# Patient Record
Sex: Female | Born: 1940 | Race: Black or African American | Hispanic: No | Marital: Single | Smoking: Former smoker
Health system: Southern US, Community
[De-identification: ages and names within clinical notes are randomized; demographics above are authoritative.]

## PROBLEM LIST (undated history)

## (undated) DIAGNOSIS — E119 Type 2 diabetes mellitus without complications: Secondary | ICD-10-CM

## (undated) DIAGNOSIS — I255 Ischemic cardiomyopathy: Secondary | ICD-10-CM

## (undated) DIAGNOSIS — Z8673 Personal history of transient ischemic attack (TIA), and cerebral infarction without residual deficits: Secondary | ICD-10-CM

## (undated) DIAGNOSIS — F0390 Unspecified dementia without behavioral disturbance: Secondary | ICD-10-CM

## (undated) DIAGNOSIS — I639 Cerebral infarction, unspecified: Secondary | ICD-10-CM

## (undated) DIAGNOSIS — E782 Mixed hyperlipidemia: Secondary | ICD-10-CM

## (undated) DIAGNOSIS — G40909 Epilepsy, unspecified, not intractable, without status epilepticus: Secondary | ICD-10-CM

## (undated) DIAGNOSIS — I252 Old myocardial infarction: Secondary | ICD-10-CM

## (undated) DIAGNOSIS — I10 Essential (primary) hypertension: Secondary | ICD-10-CM

## (undated) DIAGNOSIS — D631 Anemia in chronic kidney disease: Secondary | ICD-10-CM

## (undated) DIAGNOSIS — I2510 Atherosclerotic heart disease of native coronary artery without angina pectoris: Secondary | ICD-10-CM

## (undated) DIAGNOSIS — Z8719 Personal history of other diseases of the digestive system: Secondary | ICD-10-CM

## (undated) DIAGNOSIS — Z7401 Bed confinement status: Secondary | ICD-10-CM

## (undated) DIAGNOSIS — I509 Heart failure, unspecified: Secondary | ICD-10-CM

## (undated) DIAGNOSIS — N184 Chronic kidney disease, stage 4 (severe): Principal | ICD-10-CM

## (undated) DIAGNOSIS — R569 Unspecified convulsions: Secondary | ICD-10-CM

## (undated) DIAGNOSIS — J9611 Chronic respiratory failure with hypoxia: Secondary | ICD-10-CM

## (undated) DIAGNOSIS — R0902 Hypoxemia: Secondary | ICD-10-CM

## (undated) HISTORY — DX: Anemia in chronic kidney disease: D63.1

## (undated) HISTORY — DX: Hypoxemia: R09.02

## (undated) HISTORY — DX: Chronic kidney disease, stage 4 (severe): N18.4

---

## 2000-11-24 HISTORY — PX: ESOPHAGOGASTRODUODENOSCOPY: SHX1529

## 2000-11-24 HISTORY — PX: COLONOSCOPY: SHX174

## 2001-01-09 ENCOUNTER — Inpatient Hospital Stay (HOSPITAL_COMMUNITY): Admission: EM | Admit: 2001-01-09 | Discharge: 2001-01-20 | Payer: Self-pay | Admitting: Internal Medicine

## 2001-01-09 ENCOUNTER — Encounter: Payer: Self-pay | Admitting: Internal Medicine

## 2001-01-11 ENCOUNTER — Encounter: Payer: Self-pay | Admitting: Internal Medicine

## 2001-01-13 ENCOUNTER — Encounter: Payer: Self-pay | Admitting: Internal Medicine

## 2001-01-15 ENCOUNTER — Encounter: Payer: Self-pay | Admitting: Internal Medicine

## 2001-03-31 ENCOUNTER — Ambulatory Visit (HOSPITAL_COMMUNITY): Admission: RE | Admit: 2001-03-31 | Discharge: 2001-03-31 | Payer: Self-pay | Admitting: Gastroenterology

## 2001-05-11 ENCOUNTER — Ambulatory Visit (HOSPITAL_COMMUNITY): Admission: RE | Admit: 2001-05-11 | Discharge: 2001-05-11 | Payer: Self-pay | Admitting: Family Medicine

## 2001-05-11 ENCOUNTER — Encounter: Payer: Self-pay | Admitting: Family Medicine

## 2001-05-24 HISTORY — PX: CORONARY ARTERY BYPASS GRAFT: SHX141

## 2001-05-28 ENCOUNTER — Encounter: Payer: Self-pay | Admitting: Thoracic Surgery (Cardiothoracic Vascular Surgery)

## 2001-06-01 ENCOUNTER — Encounter: Payer: Self-pay | Admitting: Thoracic Surgery (Cardiothoracic Vascular Surgery)

## 2001-06-01 ENCOUNTER — Inpatient Hospital Stay (HOSPITAL_COMMUNITY)
Admission: RE | Admit: 2001-06-01 | Discharge: 2001-06-06 | Payer: Self-pay | Admitting: Thoracic Surgery (Cardiothoracic Vascular Surgery)

## 2001-06-02 ENCOUNTER — Encounter: Payer: Self-pay | Admitting: Thoracic Surgery (Cardiothoracic Vascular Surgery)

## 2001-06-03 ENCOUNTER — Encounter: Payer: Self-pay | Admitting: Thoracic Surgery (Cardiothoracic Vascular Surgery)

## 2001-06-06 ENCOUNTER — Encounter: Payer: Self-pay | Admitting: Thoracic Surgery (Cardiothoracic Vascular Surgery)

## 2001-06-18 ENCOUNTER — Ambulatory Visit (HOSPITAL_COMMUNITY): Admission: RE | Admit: 2001-06-18 | Discharge: 2001-06-18 | Payer: Self-pay | Admitting: Cardiology

## 2002-06-22 ENCOUNTER — Encounter: Payer: Self-pay | Admitting: Family Medicine

## 2002-06-22 ENCOUNTER — Ambulatory Visit (HOSPITAL_COMMUNITY): Admission: RE | Admit: 2002-06-22 | Discharge: 2002-06-22 | Payer: Self-pay | Admitting: Family Medicine

## 2004-02-05 ENCOUNTER — Ambulatory Visit (HOSPITAL_COMMUNITY): Admission: RE | Admit: 2004-02-05 | Discharge: 2004-02-05 | Payer: Self-pay | Admitting: Family Medicine

## 2004-08-05 ENCOUNTER — Ambulatory Visit (HOSPITAL_COMMUNITY): Admission: RE | Admit: 2004-08-05 | Discharge: 2004-08-05 | Payer: Self-pay | Admitting: Family Medicine

## 2004-10-02 ENCOUNTER — Ambulatory Visit: Payer: Self-pay | Admitting: Cardiology

## 2004-10-09 ENCOUNTER — Ambulatory Visit: Payer: Self-pay | Admitting: Cardiology

## 2004-10-09 ENCOUNTER — Ambulatory Visit (HOSPITAL_COMMUNITY): Admission: RE | Admit: 2004-10-09 | Discharge: 2004-10-09 | Payer: Self-pay | Admitting: Cardiology

## 2005-06-24 ENCOUNTER — Ambulatory Visit (HOSPITAL_COMMUNITY): Admission: RE | Admit: 2005-06-24 | Discharge: 2005-06-24 | Payer: Self-pay | Admitting: Family Medicine

## 2006-02-19 ENCOUNTER — Ambulatory Visit (HOSPITAL_COMMUNITY): Admission: RE | Admit: 2006-02-19 | Discharge: 2006-02-19 | Payer: Self-pay | Admitting: General Surgery

## 2006-02-19 ENCOUNTER — Encounter (INDEPENDENT_AMBULATORY_CARE_PROVIDER_SITE_OTHER): Payer: Self-pay | Admitting: *Deleted

## 2006-10-11 ENCOUNTER — Emergency Department (HOSPITAL_COMMUNITY): Admission: EM | Admit: 2006-10-11 | Discharge: 2006-10-11 | Payer: Self-pay | Admitting: Emergency Medicine

## 2007-04-20 ENCOUNTER — Ambulatory Visit (HOSPITAL_COMMUNITY): Admission: RE | Admit: 2007-04-20 | Discharge: 2007-04-20 | Payer: Self-pay | Admitting: Family Medicine

## 2007-05-16 ENCOUNTER — Ambulatory Visit: Payer: Self-pay | Admitting: Cardiology

## 2007-05-16 ENCOUNTER — Observation Stay (HOSPITAL_COMMUNITY): Admission: EM | Admit: 2007-05-16 | Discharge: 2007-05-18 | Payer: Self-pay | Admitting: Emergency Medicine

## 2007-12-15 ENCOUNTER — Ambulatory Visit (HOSPITAL_COMMUNITY): Admission: RE | Admit: 2007-12-15 | Discharge: 2007-12-15 | Payer: Self-pay | Admitting: Family Medicine

## 2008-05-04 ENCOUNTER — Ambulatory Visit (HOSPITAL_COMMUNITY): Admission: RE | Admit: 2008-05-04 | Discharge: 2008-05-04 | Payer: Self-pay | Admitting: Family Medicine

## 2008-10-26 ENCOUNTER — Ambulatory Visit (HOSPITAL_COMMUNITY): Admission: RE | Admit: 2008-10-26 | Discharge: 2008-10-26 | Payer: Self-pay | Admitting: Family Medicine

## 2010-01-11 ENCOUNTER — Other Ambulatory Visit: Admission: RE | Admit: 2010-01-11 | Discharge: 2010-01-11 | Payer: Self-pay | Admitting: Family Medicine

## 2010-04-03 ENCOUNTER — Ambulatory Visit (HOSPITAL_COMMUNITY): Admission: RE | Admit: 2010-04-03 | Discharge: 2010-04-03 | Payer: Self-pay | Admitting: Family Medicine

## 2010-12-15 ENCOUNTER — Encounter: Payer: Self-pay | Admitting: Family Medicine

## 2011-04-08 NOTE — Consult Note (Signed)
[name], [name]               ACCOUNT NO.:  [id number]   MEDICAL RECORD NO.:  [id number]          PATIENT TYPE:  OBV   LOCATION:  A207                          FACILITY:  [facility]   PHYSICIAN:  Kofi A. Gerilyn Pilgrim, M.D. DATE OF BIRTH:  01/12/1941   DATE OF CONSULTATION:  05/18/2007  DATE OF DISCHARGE:  05/18/2007                                 CONSULTATION   HISTORY:  A 70 year old black female who had the acute onset of slurred  speech, gait ataxia, and generalized weakness.  The patient was taken to  the emergency room where she was noted to have some left-sided weakness.  It appears that she has recovered.  She reports being on aspirin at home  and has been compliant with this.   PAST MEDICAL HISTORY:  1. Apparently significant for a mini stroke 3 years ago.  2. History of coronary artery disease.  3. Type 2 diabetes.  4. Hypertension.  5. Dyslipidemia.  6. Remote history of GI bleed.   ADMISSION MEDICATIONS:  Aspiring, omeprazole, glipizide, metoprolol,  Lipitor.   REVIEW OF SYSTEMS:  Unrevealing other than as stated in History of  Present Illness.   PAST SURGICAL HISTORY:  Status post coronary artery bypass grafting.   PHYSICAL EXAMINATION:  GENERAL:  A pleasant lady in no acute distress.  VITAL SIGNS:  Temperature 98.4, pulse 71, respirations 18, blood  pressure 162/59.  HEENT:  Head is normocephalic and atraumatic.  NECK:  Supple.  ABDOMEN:  Soft.  EXTREMITIES:  No edema.  NEUROLOGIC:  Mentation:  The patient is awake and alert.  She converses  well.  I see no evidence of dysarthria or aphasia.  She is lucid,  coherent, and follows commands bilaterally.  Cranial nerve evaluation  shows pupils equal, round, and reactive to light and accommodation.  Extraocular movements are intact.  Visual field are full.  Facial muscle  strength is symmetric. Tongue is midline. Uvula is midline.  Motor  examination shows normal tone, bulk, and strength.  I see no pronator  drift.   Coordination is essentially unrevealing.  There is a little bit  of dysmetria involved in the right upper extremity.  There is no past  pointing, no tremors or rigidity noted.  No bradykinesia.  Reflexes are  symmetric. Plantars downgoing. Sensation normal to temperature and light  touch.   MRI of the brain was reviewed.  Impressions was that there is an acute  infarct involving the posterior limb of the internal capsule.  On the  left, there is extensive chronic lipose vascular ischemic changes.  There are also remote infarcts, especially involving basal ganglia,  particularly the head of the caudate nuclei bilaterally.   Head CT scan is unrevealing.   Carotid Dopplers show no acute stenosis.   Echocardiogram is pending.   ASSESSMENT:  Lacunar-type infarct, likely due to hypertension, diabetes.   RECOMMENDATIONS:  I think her antiplatelet agent should be upgraded to  Aggrenox.  She should continue with her other regimen including blood  pressure, diabetes, and cholesterol control.   Thanks for this consultation.      Kofi  A. Gerilyn Pilgrim, M.D.  Electronically Signed     KAD/MEDQ  D:  05/19/2007  T:  05/19/2007  Job:  [id number]

## 2011-04-08 NOTE — H&P (Signed)
[name], [name]               ACCOUNT NO.:  [id number]   MEDICAL RECORD NO.:  [id number]          PATIENT TYPE:  INP   LOCATION:  A219                          FACILITY:  [facility]   PHYSICIAN:  Marcello Moores, MD   DATE OF BIRTH:  [birth year]   DATE OF ADMISSION:  05/16/2007  DATE OF DISCHARGE:  LH                              HISTORY & PHYSICAL   CHIEF COMPLAINT:  Weakness and change in speech pattern since this  morning.   HISTORY OF PRESENT ILLNESS:  [name] is a 70 year old female patient  with history of diabetes mellitus, hypertension and coronary artery  disease status post CABG who presents to emergency room by her daughter  for change in her speech pattern and generalized weakness. As per the  patient's daughter, who lives with her, she is stated that this morning  around 8:00 a.m. while they were getting ready to go to church, she  noticed that her mother's speech changed and was sort of flat. She  notices also she is weak, and she was slow to walk,and she was not  speaking as usual; she was very silent for a few works, and she decided  to bring her to the emergency room. Otherwise, she did not notice other  changes.  She did not notice any change in her breathing.  She did not  complain to her of any chest pain.  She did not complain to her of any  headache.  In the emergency room when I tried to interview her, also she  was very slow and responded to few of my questions.  She stated that she  feels weak, and she stated that she is not sure if she can walk or not,  and when I asked her to be out of the bed, she tried. She was able to  stand alone, but walking was difficult for her.  She was unable to keep  her balance, but it is very difficult to assess whether it is weakness  or imbalance. Otherwise, the history was very limited except what was  extracted from her daughter, but his daughter believes that the mother  was relatively okay until yesterday evening when  she went to bed.   REVIEW OF SYSTEMS:  As detailed in the HPI, otherwise it is limited  because the patient is not giving reasonable history.   ALLERGIES:  There are no known drug allergies.   SOCIAL HISTORY:  She lives with her daughter.  She smokes currently, but  she stopped drinking, and she denied any drug use.   PAST MEDICAL HISTORY:  1. She has history of mini stroke several years back as per the      daughter.  2. History of coronary artery disease status post CABG in 2002.  3. Diabetes mellitus, type 2.  4. Hypertension.  5. Hypercholesterolemia.  6. History of remote GI bleed.   HOME MEDICATIONS:  1. Omeprazole 20 mg p.o. once a day.  2. Glipizide 2.5 mg p.o. daily.  3. Metoprolol ER 100 mg once a day.  4. Lipitor 40 mg p.o. once a  day.   PHYSICAL EXAMINATION:  GENERAL:  The patient is lying in the emergency  room bed without any respiratory distress, and she was actually eating  her lunch without any problem.  VITAL SIGNS:  Blood pressure 187/77, pulse rate 72, respiratory rate 20,  and temperature 98, saturation 100%.  HEENT: She has pink conjunctivae.  Nonicteric sclerae.  Pupils are equal  and reactive to light.  There is not any fascial deviation.  NECK:  Supple.  CHEST:  She has good air entry bilaterally.  CARDIOVASCULAR:  S1-S2 regular, well-heard.  No murmur.  ABDOMEN:  Soft.  No area of tenderness.  Normoactive bowel sounds.  EXTREMITIES:  She does not have pedal edema.  CNS: She is alert, but she is slow, and she might be mildly demented.  On neurological examination, the power on the right side is 5/5 on upper  and lower extremities.  On the left side on the lower extremity, she can  lift her leg against gravity, but it was difficult for her to elevate  against any minor load; so it looks like there is weakness 4/5.  The  left hand also looks weaker when compared to the right. At this time it  is very difficult to say for sure there is left-side  paresis.  Otherwise, the reflexes look also on the left, knee jerk was  exaggerated. Otherwise other reflexes are normal, and tone is normal on  both sides.  She can whistle, and she can close her eyes bilaterally.  She can blow, and she can protrude her tongue centrally.  Other cranial  nerve examination is intact including sensation.  On the speech, she is  very slow to respond, but I did not see any abnormal pattern at this  time. I am not sure if this slowness is part of her chronic declining  dementia or a new onset.   LABORATORY DATA:  White blood cell was 4.3, and hemoglobin is 12.6,  hematocrit 38.5, and platelet count 195. On the chemistries, sodium is  140, potassium 4.8, chloride 115, bicarb 322, glucose 115, BUN 12, and  creatinine is 1.  First set of cardiac enzymes: CK-MB is 1.7, and  troponin is less than 0.05.   CAT scan of the brain was done also in the emergency room, and it shows  multiple lacunar infarcts in the basal ganglia and stenosis.  Otherwise  it is negative for acute hemorrhage, and an MRI was recommended.      Marcello Moores, MD  Electronically Signed     MT/MEDQ  D:  05/16/2007  T:  05/16/2007  Job:  [id number]

## 2011-04-08 NOTE — Discharge Summary (Signed)
NAMECHARLI, [name]               ACCOUNT NO.:  [id number]   MEDICAL RECORD NO.:  [id number]          PATIENT TYPE:  OBV   LOCATION:  A207                          FACILITY:  [facility]   PHYSICIAN:  Osvaldo Shipper, MD     DATE OF BIRTH:  1941/06/18   DATE OF ADMISSION:  05/16/2007  DATE OF DISCHARGE:  06/24/2008LH                               DISCHARGE SUMMARY   Please review H&P dictated by Dr. Benson Setting for details regarding the  patient's presenting illness.   PRIMARY MEDICAL DOCTOR:  Annia Friendly. [name], M.D.   DISCHARGE DIAGNOSES:  1. Acute left-sided cerebrovascular accident.  2. History of coronary artery disease, status post coronary artery      bypass graft.  3. History of hypertension.  4. History of diabetes.   BRIEF HOSPITAL COURSE:  Briefly, this is a 70 year old [place]  female who presented with weakness and speech difficulties two days ago.  She was seen in the emergency department.  She underwent a CT of the  head which showed no acute findings.  Advanced chronic small vessel  disease was noted, especially involving the basal ganglion.  Carotid  Dopplers did not show any significant stenosis.  She underwent MRI which  did show an acute lacunar infarct in the posterior limb of the internal  capsule on the left side.  Atrophy and small vessel disease were also  noted.  The patient also underwent an echocardiogram which showed normal  left ventricular size, with mild to moderate hypertrophy.  Mild  dyskinesis of the basilar posterior wall, including a small aneurysmal  segment, was also noted.  Systolic function was mildly impaired.   She also underwent blood work which was quite unremarkable actually.  Her cardiac enzymes were normal.  Blood work today does show a  bicarbonate of 15.  It was 22 two days ago.  I am unclear as to why the  bicarbonate is a little bit low today.  She is in no distress  whatsoever.   The patient is doing well.  She was cleared by  physical therapy.  She  has regained her speech.  She is able to ambulate independently with no  difficulties.  Her blood pressure is a little bit on the higher side.  Could be better controlled.  She probably would benefit from an ACE  inhibitor as well.  Otherwise, the patient is considered stable for  discharge.  She was seen by Dr. Gerilyn Pilgrim, who recommended Aggrenox  instead of Plavix.  She is already on a statin.   DISCHARGE MEDICATIONS:  1. Aggrenox 1 capsule daily for 2 more days, and then b.i.d.  2. Metoprolol extended release, change to 150 mg once daily.  3. Lisinopril 5 mg once a day.  4. Glyburide 2.5 mg once a day.  5. Lipitor 40 mg once a day.  6. Omeprazole 20 mg once a day.  7. Colace 100 mg once a day.  8. Iron tablets once a day.   FOLLOWUP:  1. With her PMD in 1-2 weeks.  2. With [name] for followup on abnormal echocardiogram in  1-2      weeks.   DIET:  She may have a modified carbohydrate diet.   PHYSICAL ACTIVITY:  No restrictions.   OUTPATIENT STUDIES RECOMMENDED:  Fasting lipid profile, which  unfortunately was not done during this hospital stay.   CONSULTATIONS:  Kofi A. Gerilyn Pilgrim, M.D., neurologist.   IMAGING STUDIES:  Discussed above.   TOTAL TIME AT DISCHARGE:  40 minutes.      Osvaldo Shipper, MD  Electronically Signed     GK/MEDQ  D:  05/18/2007  T:  05/18/2007  Job:  [id number]   cc:   Darleen Crocker A. Gerilyn Pilgrim, M.D.  Fax: [phone]   Annia Friendly. [name], MD  Fax: [phone]   [name], MD, [facility]  [address]  [place], [place] [address]

## 2011-04-08 NOTE — H&P (Signed)
NAMEJULIEANNA, [name]               ACCOUNT NO.:  [id number]   MEDICAL RECORD NO.:  [id number]          PATIENT TYPE:  INP   LOCATION:  A219                          FACILITY:  [facility]   PHYSICIAN:  Marcello Moores, MD   DATE OF BIRTH:  January 03, 1941   DATE OF ADMISSION:  05/16/2007  DATE OF DISCHARGE:  LH                              HISTORY & PHYSICAL   CONTINUATION:   ASSESSMENT:  1. Cerebrovascular accident versus transient ischemic attack.  The      patient had episode of slurred speech and slow movement with      possible altered mental status early in the morning, and she had      history of this transient ischemic attack or cerebrovascular      accident also before.  On examination, there is questionable      weakness on the right side but still visible slowness of her speech      and tone. We will admit her as possible CVA and will put her on      aspirin 325 mg with Plavix.  We will do tomorrow morning also MRI      and will put her on monitoring.  Tomorrow we will do carotid      Doppler as well, and will consult neurology also for further      evaluation. The patient is also on a statin,  and we will      continue all that and will monitor her blood pressure and control      with medication as well.  2. History of coronary artery disease status post coronary artery      bypass grafting.  The patient will have evaluation tomorrow and      will send also three times cardiac markers with EKG. If there is      any need of consultation, we will involve cardiologist.  Currently,      she has no issues related that.  3. Hypertension is not well controlled and will continue her home      medication metoprolol.  We will give it now, and if blood pressure      remains on the high side, we will optimize her hypertension      medications as well.  4. History of stroke. As per the patient's daughter, she has history      of stroke before as well, and it might be also part of what she  has      today.  5. Hypercholesterolemia. She is on Lipitor.  Will continue with that,      and we will send fasting lipid profile in the morning as well.  6. Diabetes mellitus. Currently it is well controlled with p.o.      hypoglycemic medications.  7. We will put her on deep vein thrombosis prophylaxis as well as      gastrointestinal prophylaxis, and she will be on telemetry for      today.   Our management will depend on the clinical assessment after  reevaluation.      Marcello Moores, MD  Electronically Signed     MT/MEDQ  D:  05/16/2007  T:  05/16/2007  Job:  [id number]

## 2011-04-08 NOTE — Procedures (Signed)
[name], [name]               ACCOUNT NO.:  [id number]   MEDICAL RECORD NO.:  [id number]          PATIENT TYPE:  OBV   LOCATION:  A207                          FACILITY:  [facility]   PHYSICIAN:  [name], MD, FACCDATE OF BIRTH:  02-09-1941   DATE OF PROCEDURE:  05/17/2007  DATE OF DISCHARGE:                                ECHOCARDIOGRAM   CLINICAL DATA:  A 70 year old woman with CVA, hypertension and diabetes.   M-MODE:  Aorta 3.0, left atrium 3.4, septum 1.6, posterior wall 0.9, LV  diastole 4.5, LV systole 3.7.   1. Technically adequate echocardiographic study.  2. Normal left atrium, right atrium, and right ventricle.  3. Normal aortic valve and proximal ascending aorta.  4. Normal mitral valve; mild annular calcification; very mild      regurgitation.  5. Normal tricuspid valve with physiologic regurgitation.  6. Normal pulmonic valve and proximal pulmonary artery.  7. Normal left ventricular size; mild to moderate hypertrophy;      akinesis to mild dyskinesis of the basilar posterior wall,      including a small aneurysmal segment.  Overall LV systolic function      is mildly impaired.  8. Normal IVC.      [name], MD, [facility]  Electronically Signed     RMR/MEDQ  D:  05/17/2007  T:  05/18/2007  Job:  [id number]

## 2011-04-11 NOTE — Cardiovascular Report (Signed)
[place]  Patient:    [name], [name]                      MRN: [id number] Proc. Date: 01/13/01 Adm. Date:  [id number] Attending:  [name] CC:         [name], M.D., [facility]  [name], M.D.  CV Laboratory   Cardiac Catheterization  INDICATIONS:  Mr. Guevarra is a 70 year old who has had a previous catheterization done in 1993 demonstrating patency of the LAD and occlusion of the circumflex, as well as right coronary.  She now presents with recurrent anginal type symptoms.  The current study was done to access coronary anatomy.  PROCEDURES: 1. Left and right heart catheterization. 2. Selective coronary arteriography. 3. Subclavian angiography.  DESCRIPTION OF PROCEDURE:  The procedure was performed from the right femoral artery and right femoral vein.  An 8 [place] sheath was used for the right femoral vein, a 7.5 French thermodilution Swan-Ganz catheter was used to cannulate and measure pressures in the right heart.  Superior vena cava saturation, pulmonary artery saturations were also obtained.  Thermodilution cardiac outputs were performed.  Simultaneous pressures were obtained in the LV and pulmonary capillary wedge position and a pressure pullback was performed.  Ventriculography was avoided because of the contrast load. Subclavian and coronary angiography were then performed without complication. All catheters were removed.  The patient was taken to the holding area in satisfactory clinical condition.  Because of elevated pressures, we did elect to give her 20 mg of Lasix.  HEMODYNAMICS: 1. Pressures:    a. Right atrium 8.    b. Right ventricle 35/9.    c. Pulmonary artery 38/20.    d. Pulmonary capillary wedge, mean 16.    e. Aortic 127/71.    f. LV 125/21. 2. Saturations:    a. Superior vena cava 58%.    b. Pulmonary artery 58%.    c. Aortic root 98%. 3. Cardiac outputs:    a. Fick  4.7/2.7.    b. Thermodilution 3.0/1.87.  ANGIOGRAPHIC DATA:  The left main coronary artery demonstrates 50% tapered narrowing in its distal most aspect.  The left anterior descending artery has about an 80-90% focal stenosis before a diagonal branch.  The distal vessel is a fairly large caliber vessel and wraps the apex and provides collaterals to the distal right coronary circulation.  There is a large ramus intermedius with about 50-60% proximal narrowing. There is a suggestion of narrowing of up to 70% involving the ostium of the circumflex.  The AV circumflex also is without critical disease and suitable for grafting.  The right coronary artery demonstrates a right ventricular branch with tandem 90% stenoses.  There is total occlusion of the right coronary artery with some late filling of the vessel through left to right collaterals.  The subclavian is widely patent.  CONCLUSIONS: 1. Mild pulmonary hypertension. 2. Three-vessel coronary artery disease.  DISPOSITION:  I will review the findings with Dr. Graciela Husbands.  Once her pulmonary status improves, she may be a candidate for revascularization. DD:  01/13/01 TD:  01/14/01 Job: [id number]

## 2011-04-11 NOTE — Consult Note (Signed)
[place]. [facility]  Patient:    [name], [name]                      MRN: [id number] Proc. Date: 01/14/01 Adm. Date:  [id number] Attending:  [name] CC:         Arturo Morton. Riley Kill, M.D. [facility]  [name], M.D. [facility]  [name], M.D., [facility]  Mirna Mires, M.D.  [name], M.D.   Consultation Report  REFERRING PHYSICIAN:  Noralyn Pick. Eden Emms, M.D.  PRIMARY CARDIOLOGIST:  [name], M.D. and [name], M.D.  PRIMARY CARE PHYSICIAN:  Mirna Mires, M.D. and [name], M.D.  REASON FOR CONSULTATION:  Severe three-vessel coronary artery disease, status post acute non-Q-wave myocardial infarction in the setting of acute GI bleed.  HISTORY OF PRESENT ILLNESS:  The patient is a 70 year old African-American female from [place], [place], who is disabled due to previous strokes and poor eyesight. She has history of coronary artery disease dating back to 70. At which time, she apparently suffered myocardial infarction. She underwent cardiac catheterization at that time demonstrating 100% occlusion of the mid right coronary artery and the distal left circumflex coronary arteries, respectively. There was insignificant disease in the left anterior descending coronary artery. The patient was treated medically. The patient additionally has cardiac risk factors, including hypertension, type 2 diabetes mellitus, continued ongoing tobacco abuse, hyperlipidemia, severe peripheral vascular disease with history of previous strokes. The patient was in her usual state of health, although she describes a 40-pound weight loss over the last 6 months. She presented to [facility] on February 15 and again on February 16 with severe weakness, dizziness, shortness of breath, and a dry cough. She reported a 4 to 5-day history of melena. She was noted to have severe anemia with a baseline hemoglobin of  6.3 and hematocrit of 18.5% with obvious gross heme-positive stool. She was also noted to have relatively low blood pressure and elevated cardiac enzymes. She ruled in for an acute non-Q-wave myocardial infarction with peak total CPK of 416, with a CK-MB fraction of 31. Her baseline prothrombin time on admission was 26.3, with an INR of 4.4 due to the patients continued therapy with Coumadin. The patient was stabilized and subsequently transferred to [facility]. Since arrival at [facility], the patient has been stabilized. She initially apparently had episodes of supraventricular tachycardia and subsequently converted to normal sinus rhythm. Her coagulopathy was corrected, and she underwent upper GI endoscopy by Judie Petit T. Pleas Koch., M.D. on February 17. This demonstrated erosive gastritis and duodenitis which was felt to be the likely source of the patients GI bleeding. The patients anemia was corrected with transfusion of blood products, and she gradually improved. A transthoracic echocardiogram was performed on February 18. This revealed severe left ventricular dysfunction with baseline ejection fraction estimated between 25 and 35%. The left ventricle was noted to be a normal size, however; and there was only mild mitral regurgitation. There was no significant aortic valve pathology. No other abnormalities were noted. The patient subsequently underwent elective cardiac catheterization with left and right heart catheterization on February 20 by Arturo Morton. Riley Kill, M.D. This reveals severe three-vessel coronary artery disease. A left ventriculogram was not performed, but right heart pressures were only mildly elevated with a PA pressure of 38/20, with a pulmonary capillary wedge pressure of 16. There was no gradient measured across the aortic valve. Resting cardiac  output was 3.0 L corresponding to a cardiac index of 1.9 via thermodilution. Via the Fick method, the  cardiac output was 4.7. Mixed venous oxygen saturation was 58%. The patient was referred for possible elective surgical revascularization.  REVIEW OF SYSTEMS:  Mrs. Pontarelli reports longstanding history of mild dyspnea on exertion. Apparently, this has been worse recently. She denies any episodes of resting shortness of breath, PND, orthopnea, or lower extremity edema. She reports no history of syncope or palpitations. She denies any history of chest pain. She reports no abdominal pain. She has had some trouble with constipation and perhaps slight decrease in stool caliber, although essentially her bowels have been functioning normally. She reports a good appetite; but despite this, she has lost nearly 40 pounds in less than 6 months. She is unsure why. She denies any history of dysphagia or odynophagia. She reports no fevers, chills, or productive cough. She denies any episodes of transient ______ blindness or transient numbness or weakness in either upper or lower extremity. She does not walk much at all. She states that both legs get very weak on her. She lives a very sedentary lifestyle. She continues to smoke. The remainder of his review of systems is unrevealing.  PAST MEDICAL HISTORY:  Notable for coronary artery disease, previous stroke on three occasions (the details of these are not available and the patient is unclear), hypertension, type 2 diabetes mellitus, hyperlipidemia, previous alcohol abuse, ongoing tobacco abuse.  SOCIAL HISTORY:  The patient lives with her daughter and is disabled. She continues to smoke 1 1/2 packs of cigarettes per day. She does not drive a car and essentially does very little physical activity whatsoever.  MEDICATIONS PRIOR TO ADMISSION: 1. Coumadin 5 mg alternating with 7.5 mg daily. 2. Glucophage 500 mg twice daily.  3. Remeron 30 mg once daily. 4. Aspirin 325 mg once daily. 5. Lescol 20 mg once daily. 6. Tenormin 25 mg once  daily.  ALLERGIES:  The patient denies any known drug allergies or sensitivities.  PHYSICAL EXAMINATION:  GENERAL:  A thin, somewhat frail-appearing, African-American female who appears somewhat older than her stated age but is in no acute distress.  VITAL SIGNS:  She is in normal sinus rhythm by monitor with heart rate in the 90s, blood pressure has been stable approximately 100/50 mmHg, oxygen saturations are 97 to 99% on room air.  HEENT:  Notable for poor dentition.  NECK:  Supple. There are no masses or palpable lymphadenopathy. There is no jugular venous distension. No carotid bruits are noted.  CHEST:  Auscultation of the chest demonstrates bibasilar inspiratory crackles. No wheezes or rhonchi are noted. There are somewhat diminished breath sounds at both bases.  CARDIOVASCULAR:  Regular rate and rhythm. No murmurs, rubs, or gallops are noted.  ABDOMEN:  Soft and nontender. No masses are identified. The liver edge is not enlarged.  EXTREMITIES:  Warm and well perfused. There is no lower extremity edema. There is no venous insufficiency. Distal pulses are palpable although thready in both feet at the ankles.  NEUROLOGICAL:  Grossly nonfocal.  RECTAL/GENITOURINARY:  Deferred.  LABORATORY DATA:  Cardiac catheterization films are reviewed. These demonstrate severe three-vessel disease. There is 80% stenosis of the mid left anterior descending coronary artery arising after a small first diagonal branch. There is 50 to 60% proximal stenosis of a large first circumflex marginal branch which supplies the majority of the lateral wall of the left ventricle. There is a smaller second circumflex marginal branch and there appears  to be a 70% proximal stenosis of the left circumflex. There is subtotal occlusion of the left circumflex after takeoff of the second circumflex marginal branch. There is 50% stenosis of the distal left main coronary artery. There is 100% occlusion of  the mid right coronary artery. There appears to be left to right collateral filling of very poor distal vessels in the distal right coronary circulation.  IMPRESSION:  Severe three-vessel coronary artery disease, status post acute non-Q-wave myocardial infarction in the setting of severe anemia related to acute gastrointestinal bleed. Mrs. Cowles also has moderate to severe left ventricular dysfunction with at least class 3 symptoms of congestive heart failure. She has numerous other comorbid conditions including apparently several previous strokes, as well as hypertension, hyperlipidemia, and type 2 diabetes mellitus, and ongoing tobacco abuse. She appears to be malnourished and reports that she has lost more than 40 pounds in weight in less than 6 months. The reason for this is unclear.  PLAN:  I recommend continued medical therapy for at least 2 or 3 weeks to allow healing from her recent upper GI bleed before considering elective surgical revascularization for treatment of her coronary artery disease. She clearly has very significant coronary artery disease and likely would benefit from bypass grafting, although the risks of surgery will be considerable due to the patients comorbid conditions. She needs physical therapy consultation and aggressive rehab during the short term. I would consider leaving off Coumadin indefinitely as long as she remains in normal sinus rhythm and treating her with enteric-coated aspirin. In addition, there is by report some suggestion of soft tissue mass on chest x-ray obtained yesterday. We will obtain a CT scan of the neck and chest to further investigate this. We will also check a CEA level, as well as a baseline hemoglobin A1C. We will continue to follow in the meantime. DD:  01/14/01 TD:  01/15/01 Job: [id number]

## 2011-04-11 NOTE — Op Note (Signed)
[place]. [facility]  Patient:    [name], [name]                      MRN: [id number] Proc. Date: 06/01/01 Adm. Date:  [id number] Attending:  Tressie Stalker CC:         [name], M.D. [facility]  Maisie Fus D. Riley Kill, M.D. [facility]  Mirna Mires, M.D.  Kerri Perches, MD  Noralyn Pick. Eden Emms, M.D. [facility]   Operative Report  PREOPERATIVE DIAGNOSIS:  Severe three-vessel coronary artery disease, status post acute non-Q-wave myocardial infarction.  POSTOPERATIVE DIAGNOSIS:  Severe three-vessel coronary artery disease. status, post acute non-Q-wave myocardial infarction.  OPERATION PERFORMED:  Median sternotomy for coronary artery bypass grafting x 4 (left internal mammary artery to distal left anterior descending coronary artery, saphenous vein graft to first circumflex marginal branch and sequential saphenous vein graft to second circumflex marginal branch, saphenous vein graft to distal right coronary artery).  SURGEON:  Salvatore Decent. [name], M.D.  ASSISTANT: 1. Salvatore Decent. Dorris Fetch, M.D. 2. Lissa Hoard, P.A.  ANESTHESIA:  General.  INDICATIONS FOR PROCEDURE:  The patient is a 70 year old African-American female from [place], [place] with multiple medical problems who is disabled from previous stroke and has a long history of coronary artery disease.  In February of this year she presented with an acute GI bleed with severe anemia.  She suffered non-Q-wave myocardial infarction at that time related to her underlying anemia.  She ultimately recovered from this and cardiac work-up at that time was notable for the finding of severe three-vessel coronary artery disease with moderate left ventricular dysfunction.  Initially, she was reluctant to consider to proceed with elective coronary artery bypass grafting, but after several visits in the office with her family members present, the patient now desires to proceed. Both she and her daughter were  counseled at length regarding the indications and potential benefits of coronary artery bypass grafting.  They understand the associated risks of surgery including but not limited to risks of death, stroke, myocardial infarction, congestive heart failure, respiratory failure, renal failure, bleeding requiring blood transfusion, arrhythmia, infection, and recurrent coronary artery disease.  All of their questions have been addressed.  DESCRIPTION OF PROCEDURE:  The patient was brought to the operating room on the above-mentioned date and invasive hemodynamic monitoring was established by the anesthesia service under the care and direction of Dr. Michelle Piper.  The patient was placed in supine position on the operating table.  Intravenous antibiotics were administered.  Following induction of general endotracheal anesthesia, the patients chest, abdomen, both groins, and both lower extremities were prepared and draped in a sterile manner.  A median sternotomy incision was performed and the left internal mammary artery was dissected from the chest wall and prepared for bypass grafting. The left internal mammary artery was somewhat small caliber but felt to be acceptable quality conduit.  There was adequate antegrade flow. Simultaneously saphenous vein was obtained from the patients right thigh through a longitudinal incision.  The saphenous vein was felt to be a good quality conduit.  The patient was heparinized systemically.  The pericardium was opened. The ascending aorta was inspected and was notable for sclerosis throughout without any discrete areas of palpable plaque or calcifications.  The ascending aorta and right atrium were cannulated for cardiopulmonary bypass.  Adequate heparinization was verified.  Cardiopulmonary bypass was begun and the surface of the heart was inspected. There was diffuse scarring in the inferior wall from  previous myocardial infarction.  There was diffuse  coronary artery disease.  Distal sites were selected for coronary artery bypass grafting.  Portions of saphenous vein and the left internal mammary artery were all trimmed to appropriate lengths.  A temperature probe was placed in the left ventricular septum and a styrofoam pad was placed to protect the left phrenic nerve from thermal injury.  A cardioplegia catheter was placed in the ascending aorta.  The patient was cooled to 28 degrees systemic temperature.  The aortic crossclamp was applied and cardioplegia was delivered in antegrade fashion through the aortic root.  Iced saline slush was applied for topical hypothermia.  The initial cardioplegic arrest and myocardial cooling were felt to be excellent.  Additional doses of cardioplegia were administered intermittently throughout the crossclamp portion of the operation both through the aortic root and down subsequently placed vein grafts to maintain septal temperature below 15 degrees centigrade.  The following distal coronary anastomoses were performed:  (1) The distal right coronary artery was grafted with a saphenous vein graft in an end-to-side fashion just above the area of its bifurcation with the posterior descending coronary artery.  This coronary measures 1.5 mm in diameter and is of fair quality.  (2) The first circumflex marginal branch is grafted with a saphenous vein graft in a side-to-side fashion.  This coronary measures 2 mm in diameter and is of fair to good quality.  (3) The second circumflex marginal branch was grafted with sequential saphenous vein graft using the vein placed in the first circumflex marginal branch.  This coronary measured 1.5 mm in diameter and was of fair to good quality.  (4) The distal left anterior descending coronary artery was  grafted to the left internal mammary artery in an end-to-side fashion.  This coronary measured 1.8 mm in diameter at the site of distal bypass and was of fair quality.   There was some palpable plaque diffusely noted throughout this vessel.  Both proximal saphenous vein anastomoses were performed directly to the ascending aorta prior to removal of the aortic crossclamp.  After initial reperfusion of the left internal mammary artery, the septal temperature was noted to rise somewhat slow.  Therefore an additional dose of cardioplegia was administered and the distal anastomosis was reinspected.  There appears to be no problems with the anastomosis and this was reperformed uneventfully.  The septal temperature again seems to rise somewhat slow but at this point it is noted that the patients perfusion pressure is relatively low.  The patients perfusion pressure is elevated to greater than 70 mmHg.  At this time the septal temperature rose rapidly and appropriately.  The aortic crossclamp was removed after a total crossclamp time of 82 minutes.  The heart was defibrillated into normal sinus rhythm.  All proximal and distal anastomoses were inspected for hemostasis and appropriate graft orientation. Epicardial pacing wires were fixed to the right ventricular outflow tract and to the right atrial appendage.  The patient was rewarmed to greater than 37 degrees centigrade temperature.  The patient was weaned from cardiopulmonary bypass without difficulty.  The patients rhythm at separation from bypass was normal sinus rhythm.  No inotropic support was required.  The patient was transfused two units packed red blood cells during cardiopulmonary bypass due to anemia with hematocrit of 18% after institution of bypass.  The patient was noted to have anemia preoperatively. Total cardiopulmonary bypass time for the operation was 109 minutes.  The venous and arterial cannulae were removed uneventfully.  Protamine  was administered to reverse the anticoagulation.  The mediastinum and the left chest were irrigated with saline solution containing vancomycin.   Meticulous surgical hemostasis was ascertained.  The patient remained overall stable with the exception of her blood pressure which was somewhat labile during this portion of the operation.  It was noted that she remained relatively hypovolemic and once adequate volume resuscitation was performed, her blood pressure stabilized nicely.  She was transfused an additional two units of packed red blood cells during this portion of the operation due to anemia and ongoing volume requirement.  The patient remained hemodynamically stable throughout the remainder of the operation.  The mediastinum and the left chest were drained with three chest tubes placed through separate stab incisions inferiorly.  The median sternotomy was closed in a routine fashion.  The right thigh incision was closed in multiple layers in routine fashion.  All skin incisions were closed with subcuticular skin closures.  The patient tolerated the procedure well and was transported to the surgical intensive care unit in stable condition.  There were no intraoperative complications.  All sponge, needle and instrument counts were verified correct at the completion of the operation. DD:  06/01/01 TD:  06/01/01 Job: [id number]

## 2011-04-11 NOTE — Discharge Summary (Signed)
[place]. [facility]  Patient:    [name], [name]                      MRN: [id number] Adm. Date:  [id number] Disc. Date: [id number] Attending:  [name] Dictator:   [name], CRNP CC:         [name], M.D. [facility] [place] Head office             [name], M.D. [facility]             [name], [place], [place]             [name], M.D.                           Discharge Summary  PRIMARY DIAGNOSIS:  Status post myocardial infarction.  SECONDARY DIAGNOSES: 1. Gastrointestinal bleed. 2. History of cerebrovascular accident, on Coumadin therapy at home. 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 4. Hypertension. 5. Type 2 diabetes. 6. Tobacco abuse. 7. Hyperlipidemia.  HISTORY OF PRESENT ILLNESS:  This is a 70 year old African-American female who was admitted with known CAD in 76.  She presented to [facility] on January 08, 2001.  At [facility] she complained of weakness on January 08, 2001.  Discharge laboratories at that time showed a hemoglobin of 6.3.  The patient had melena for the preceding two to three weeks, accompanied by shortness of breath, dyspnea on exertion, edema, and nocturnal dyspnea.  On reevaluation on January 09, 2001, by Dr. Chandra Batch the patient ruled in for MI by troponin, which was 20.  The patient was admitted and transferred to [facility] at that point.  PAST MEDICAL HISTORY: 1. Chronic renal insufficiency. 2. Hypertension. 3. Hyperlipidemia.  The patient ruled in for a non-Q-wave MI and GI bleed with a hemoglobin of 6.3 as a contributing factor.  HOSPITAL COURSE:  The patient had a GI evaluation.  LFTs were within normal limits.  She was admitted with acute GI bleed, over-anticoagulation. Hemoglobin was 6.3, INR was 4.4 on admission.  The patient received packed cells.  Anticoagulation was reversed.  The patient was transfused with three units of packed rbcs.  The patient  underwent EGD which showed erosive duodenitis and gastritis.  She was placed on Flagyl and Biaxin.  Colonoscopy was placed on hold, and she was also placed on proton pump inhibitors.  The patient had no further GI bleeding.  Hemoglobin remained stable.  She underwent a cardiac catheterization on January 13, 2001, which showed three-vessel CAD.  She had a CVTS surgery consult placed.  The patients ejection fraction by cardiac catheterization was 25-35%.  She was also noted to have hypotension.  She was placed on a dopamine drip at 3 cc/hr.  CVTS did a consult on the patient.  They felt that she would need to recuperate from her duodenitis and gastritis as well as increase her physical strength over the next three weeks, and she will be reevaluated in their office as an outpatient for the need of elective bypass surgery.  She continued to improve. She was placed on aspirin therapy.  Coumadin was held because of the recent GI bleed and possible complications of restarting Coumadin.  She was weaned off her dobutamine.  The patient had a preoperative cardiac evaluation carotid Doppler which showed no evidence of significant internal carotid stenosis. Vertebral artery flow was antegrade.  ABIs indicated mild reduction  on the right and normal on the left.  The patient is status post MI with three-vessel CAD.  She had consult with cardiac rehabilitation, PT, and OT, and she was discharged to home on the following medications.  MEDICATIONS: 1. Flagyl 500 mg twice a day for one more day. 2. Biaxin 500 mg twice a day for one more day. 3. Protonix 40 mg twice a day. 4. Lopressor 25 mg twice a day. 5. Lescol 20 mg nightly. 6. Altace 2.5 mg daily. 7. Nitroglycerin as needed. 8. Coated aspirin 81 mg daily.  FOLLOW-UP:  Home health was to come and see the patient.  She had an appointment scheduled for [name] on Thursday, February 11, 2001, at 2:30 p.m.  She is to follow with Dr. [name] in  three weeks on February 15, 2001, at 10:30 a.m.  She is to follow with [name] next week for blood work, and [name] in two weeks, and the office will call with an appointment.  DISCHARGE INSTRUCTIONS:  She was to follow a low fat, low cholesterol, no added salt, diabetic diet.  CBC and BMET within one week. DD:  01/20/01 TD:  01/21/01 Job: [id number]

## 2011-04-11 NOTE — H&P (Signed)
[place]  Patient:    [name], [name]                        MRN: [id number] Adm. Date:  06/01/01 Attending:  Salvatore Decent. [name], M.D. Dictator:   Durenda Age, P.A.-C. CC:         Dr. Paulla Fore M. Dietrich Pates, M.D. [facility]   History and Physical  DATE OF BIRTH:  [birth year]  CHIEF COMPLAINT:  Coronary artery disease.  HISTORY OF PRESENT ILLNESS:  Mr. Vitali is a pleasant 70 year old black female referred by [name] for evaluation of 3 vessel CAD.  The patient presented in late February with acute GI bleed related to anticoagulation with Coumadin, gastritis, and duodenitis, seen at the time of her EGD.  At the time of presentation she suffered a non Q wave MI, mild in severity, as well as severe anemia.  With transfusion of blood product, she gradually improved. Transesophageal echocardiogram was performed on January 11, 2001.  This revealed severe left ventricular dysfunction with baseline ejection fraction estimated between 25 and 35%.  The left ventricle was noted to have a normal size, however.  Another finding was mild mitral regurgitation.  There was no significant aortic valve pathology.  No other abnormalities were noted.  The patient subsequently underwent cardiac catheterization with left and right heart catheterization on January 13, 2001, by Dr. Riley Kill.  This revealed severe 3 vessel coronary artery disease, with moderate to severe left ventricular dysfunction.  She then was evaluated by Dr. [name] on February 15, 2001, at which time she was reluctant to proceed with surgical repair.  She then returned, reporting hematochezia.  She was sent for a GI evaluation, undergoing colonoscopy, which was remarkable for internal hemorrhoids, likely the source of her bleeding.  On May 03, 2001, she once again was evaluated by Dr. [name], at which time, plans for surgical revascularization were made.  Other than her  longstanding history of mild dyspnea on exertion, she denies any shortness of breath at rest, cough or sputum production.  No anginal symptoms or fever or chills.  NO symptoms of TIA, CVA or amaurosis fugax.  PAST MEDICAL HISTORY:  1. Coronary artery disease since 1993.  2. Hypertension.  3. Hypercholesterolemia.  4. Chronic obstructive pulmonary disease, ejection fraction 25-35%.  5. History of gastritis and duodenitis with GI bleed.  6. Recent history of internal hemorrhoids.  7. Previous history of alcohol abuse.  8. Ongoing tobacco abuse.  9. 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 10. Diabetes type 2. 11. 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 12. History of cerebrovascular accident x 3. 13. History of PVOD CRI.  PAST SURGICAL HISTORY:  1. Status post cardiac catheterization March 2002.  2. Status post esophagogastroduodenoscopy March 2002.  3. Status post cardiac catheterization 1993.  MEDICATIONS:  Unfortunately, the patient does not remember which medications she is taking, or bring a list of them.  She was instructed to provide a list of medications at the time of her surgery.  ALLERGIES:  No known drug allergies.  REVIEW OF SYSTEMS:  See HPI and past medical history for significant positives.  She denies any history of asthma or CHF.  No hematuria, dysuria or hematochezia at this time.  FAMILY HISTORY:  Significant for father alive with a history of hypertension, otherwise noncontributory.  SOCIAL HISTORY: The patient is single.  She has 2 children.  ONe of them disabled.  She  is retired.  She denies any alcohol intake at this time.  She smokes 1-1/2 packs of tobacco for the last 20 years.  She is a very poor historian.  PHYSICAL EXAMINATION:  GENERAL:  This is a 70 year old black female in no acute distress, alert and oriented x 3, unkempt with somewhat slow response, and poor pronunciation of words.  VITAL SIGNS:  Blood pressure 120/60, pulse 68,  respirations 18.  HEENT:  Normocephalic and atraumatic.  PERRLA, EOMI.  No cataracts, glaucoma or macular degeneration.  Positive for facial hair.  POor dentition.  NECK:  Supple.  No JVD, bruits or lymphadenopathy.  CHEST:  Symmetrical on inspirations.  LUNGS:  Clear to auscultation bilaterally.  CARDIOVASCULAR:  Regular rate and rhythm without murmurs, rubs or gallops.  ABDOMEN:  Soft, nontender.  Bowel sounds x 4 normal.  No masses or bruits.  GU AND RECTAL:  Deferred.  EXTREMITIES:  Bilateral clubbing in her hands.  NO cyanosis or edema.  No ulcerations.  Warm temperature.  Peripheral pulses, carotid and femoral 2+ bilaterally, popliteal, dorsalis pedis and posterior tibialis 1+ bilaterally.  NEUROLOGIC:  Nonfocal.  Gait steady.  DTRs 1+ on the right, 2+ on the left. Muscle strength 5/5.  ASSESSMENT AND PLAN:  Three vessel coronary artery disease for CABG x 4, on June 01, 2001, at [facility].  Dr. [name] has seen and evaluated this patient prior to the admission and has explained the risks and benefits involving the procedure and the patient has agreed to continue. DD:  06/03/01 TD:  06/03/01 Job: [id number]

## 2011-04-11 NOTE — Procedures (Signed)
[name], [name]               ACCOUNT NO.:  [id number]   MEDICAL RECORD NO.:  [id number]          PATIENT TYPE:  OUT   LOCATION:  RAD                           FACILITY:  [facility]   PHYSICIAN:  Vida Roller, M.D.   DATE OF BIRTH:  [birth year]   DATE OF PROCEDURE:  10/09/2004  DATE OF DISCHARGE:                                  ECHOCARDIOGRAM   PRIMARY CARE PHYSICIAN:  Annia Friendly. [name], M.D.   TAPE NUMBER:  [id number].   TAPE COUNT:  [id number] through [id number].   INDICATIONS FOR PROCEDURE:  This is a 70 year old woman with hypertension,  diabetes, and cardiomyopathy.  No previous studies.  The technical quality  of this study is adequate.   M-MODE TRACINGS:  The aorta is 31 mm.   The left atrium is 32 mm.   The septum is 16 mm.   The posterior wall is 13 mm.   The left ventricular diastolic dimension is 41 mm.   The left ventricular systolic dimension is 32 mm.   2-D AND DOPPLER IMAGING:  The left ventricle is normal size.  There is  preserved left ventricular systolic function.  The base of the septum and  the base of the inferior posterior wall both appear to be mildly hypokinetic  and thin in a pattern which is not consistent with coronary disease.  The  remainder of the walls appear to be normal.   The right ventricle is normal size with normal systolic function.   Both atria appear to be normal size.   The aortic valve is sclerotic with no stenosis or regurgitation.   The mitral valve has significant anterior mitral leaflet prolapse with a  posteriorly directed mitral regurgitation jet which is at least mild.  No  stenosis is seen.   The tricuspid valve has mild regurgitation.   The pulmonic valve is not well seen.   No pericardial effusion.   The inferior vena cava appears to be normal size.   The ascending aorta is not well seen.     Trey Paula   JH/MEDQ  D:  10/09/2004  T:  10/09/2004  Job:  [id number]

## 2011-04-11 NOTE — H&P (Signed)
NAMECHELE, [name]               ACCOUNT NO.:  [id number]   MEDICAL RECORD NO.:  [id number]           PATIENT TYPE:  AMB   LOCATION:                                FACILITY:  [facility]   PHYSICIAN:  Jerolyn Shin C. Katrinka Blazing, M.D.   DATE OF BIRTH:  Dec 22, 1940   DATE OF ADMISSION:  DATE OF DISCHARGE:  LH                                HISTORY & PHYSICAL   HISTORY OF PRESENT ILLNESS:  A 70 year old female referred for initial  colonoscopy.  She has regular bowel movements.  No history of rectal  bleeding.  No family history of colon cancer or colon polyps.  The patient  is scheduled for initial screening colonoscopy.   PAST HISTORY:  1.  Atherosclerotic heart disease with cardiomyopathy.  2.  Diabetes mellitus.  3.  Hypertension.  4.  Hyperlipidemia.   MEDICATIONS:  1.  Altace 10 mg daily.  2.  Glyburide metformin 2.5/500 daily.  3.  Colace 100 mg two at bedtime.  4.  Protonix 40 mg daily.  5.  Lescol 20 mg daily.  6.  Aspirin 325 mg daily.  7.  Lopressor 25 mg twice daily.   PHYSICAL EXAMINATION:  VITAL SIGNS:  Blood pressure 140/80, pulse 84,  respirations 20, weight 148 pounds.  HEENT:  Unremarkable.  NECK:  Supple.  No JVD, bruit, adenopathy or thyromegaly.  CHEST:  Clear to auscultation.  HEART:  Regular rate and rhythm without murmur, gallop, or rub.  ABDOMEN:  Soft, nontender.  No masses.  EXTREMITIES:  No clubbing, cyanosis, or edema.  NEUROLOGIC:  No focal motor, sensory or cerebellar deficits.   IMPRESSION:  1.  Need for screening colonoscopy.  2.  Atherosclerotic heart disease.  3.  Cardiomyopathy.  4.  Hypertension.   PLAN:  Scheduled for total colonoscopy.      Dirk Dress. Katrinka Blazing, M.D.  Electronically Signed     LCS/MEDQ  D:  02/18/2006  T:  02/19/2006  Job:  [id number]

## 2011-04-11 NOTE — Discharge Summary (Signed)
[place]  Patient:    [name], [name]                      MRN: [id number] Adm. Date:  [id number] Disc. Date: [id number] Attending:  Tressie [name] Dictator:   Kimberly Merlin, P.A.C. CC:         CVTS office  Kimberly Friends. Dietrich Pates, M.D. [facility]  [name]   Discharge Summary  DATE OF BIRTH:  [birth year].  PHYSICIANS: 1. Surgeon, [name]. 2. Cardiology, [name]. 3. Primary care, [name].  ADMITTING DIAGNOSES: 1. Severe three vessel coronary artery disease. 2. Status post non-Q-wave myocardial infarction. 3. Severe left ventricular dysfunction with ejection fraction of 25-35%.  DISCHARGE DIAGNOSES: 1. Severe three vessel coronary artery disease. 2. Status post non-Q-wave myocardial infarction. 3. Severe left ventricular dysfunction with ejection fraction of 25-35%. 4. Postoperative anemia.  BRIEF HISTORY:  [name] is a 70 year old black female.  Her situation initially began in late February of this year when she suffered a non-Q-wave MI during a workup for an acute GI bleed.  She was referred to Dr. Salvatore Decent. Cornelius [name] who saw her February 15, 2001.  At that time, she was reluctant to proceed with surgical repair.  She had further GI symptoms and workup.  By May 03, 2001, she was again seen by [name]; at which time, plans for CABG were made.  This was scheduled for June 01, 2001.  Dr. Salvatore Decent. Cornelius [name] saw [name] and explained the risks, benefits, details, and alternatives to surgery and she agreed to proceed.  HOSPITAL COURSE:  She underwent CABG x 4 on June 01, 2001 as planned.  The following grafts were done:  LIMA to LAD, saphenous vein graft to distal RCA, sequential saphenous vein graft from OM-1 to OM-2.  She did well with surgery and responded well to routine care postoperatively.  On postoperative day one, she was deemed suitable for transfer to unit 2000. She made it over to unit 2000 on  postoperative day two where she did well walking with cardiac rehabilitation.  She had no complications.  By postoperative day four, she was doing very well.  She was in sinus rhythm. Vital signs were stable.  She was afebrile.  Physical exam was satisfactory. She was prepared for discharge the following day.  On postoperative day five, she was again doing well.  She had mild anemia with hemoglobin of 9.2.  She was taking p.o. iron for this.  Otherwise, there were no problems at all.  She was discharged home.  PROCEDURES:  Coronary artery bypass graft x 4 on June 01, 2001 as described in hospital course.  DISCHARGE MEDICATIONS:  1. Enteric-coated aspirin 325 mg 1 p.o. q.d.  2. Lopressor 50 mg 1 p.o. b.i.d.  3. Altace 2 mg 1 p.o. q.d.  4. Lescol 20 mg 1 p.o. q.d.  5. Remeron 30 mg 1 p.o. q.d.  6. Protonix 40 mg 1 p.o. q.d.  7. Glucophage 500 mg 1 p.o. b.i.d.  8. Niferex 150 mg 1 p.o. q.d.  9. Colace 100 mg 2 p.o. q.d. 10. Percocet 5/325 mg 1-2 p.o. q.4-6h. p.r.n. pain.  ALLERGIES:  No known allergies.  SPECIAL INSTRUCTIONS:  She was told to avoid strenuous activity.  No lifting over 10 pounds.  No driving.  Walk daily.  She could shower.  She was to use her incentive spirometer daily.  She was to  clean her wounds gently daily with soap and water and to be alert for increasing redness, swelling, drainage, or fever and to call the office if she had any problems.  She was told to get a chest x-ray when she saw her cardiologist in follow-up and to bring it with her to see [name].  CONDITION ON DISCHARGE:  Stable.  FOLLOW-UP:  [name] on Friday, June 18, 2001 at 11 a.m.  Dr. Salvatore Decent. Cornelius [name] on Monday, June 28, 2001 at 9 a.m. DD:  06/21/01 TD:  06/21/01 Job: [id number]

## 2011-09-10 LAB — DIFFERENTIAL
Band Neutrophils: 2
Basophils Absolute: 0
Blasts: 0
Blasts: 0
Eosinophils Relative: 1
Eosinophils Relative: 3
Lymphocytes Relative: 30
Lymphocytes Relative: 37
Lymphocytes Relative: 43
Metamyelocytes Relative: 1
Monocytes Relative: 13 — ABNORMAL HIGH
Myelocytes: 0
Neutro Abs: 1.6 — ABNORMAL LOW
Neutrophils Relative %: 40 — ABNORMAL LOW
Neutrophils Relative %: 48
Promyelocytes Absolute: 0
nRBC: 0

## 2011-09-10 LAB — CBC
HCT: 34.8 — ABNORMAL LOW
HCT: 38.4
Hemoglobin: 12.6
MCHC: 32.2
MCV: 84.9
Platelets: 186
Platelets: 190
RBC: 4.52
RDW: 14.8 — ABNORMAL HIGH
RDW: 15.4 — ABNORMAL HIGH
WBC: 4.3

## 2011-09-10 LAB — BASIC METABOLIC PANEL
BUN: 22
BUN: 25 — ABNORMAL HIGH
CO2: 21
Calcium: 8.4
Calcium: 8.9
Chloride: 115 — ABNORMAL HIGH
Creatinine, Ser: 0.93
Creatinine, Ser: 0.94
GFR calc non Af Amer: 54 — ABNORMAL LOW
GFR calc non Af Amer: 60 — ABNORMAL LOW
GFR calc non Af Amer: >60
Glucose, Bld: 77
Potassium: 4.8
Sodium: 140

## 2011-09-10 LAB — POCT CARDIAC MARKERS
CKMB, poc: 1.7
Myoglobin, poc: 91.8
Troponin i, poc: <0.05

## 2011-09-10 LAB — PROTIME-INR: INR: 1.1

## 2011-10-01 ENCOUNTER — Emergency Department (HOSPITAL_COMMUNITY): Payer: Medicare Other

## 2011-10-01 ENCOUNTER — Other Ambulatory Visit: Payer: Self-pay

## 2011-10-01 ENCOUNTER — Emergency Department (HOSPITAL_COMMUNITY)
Admission: EM | Admit: 2011-10-01 | Discharge: 2011-10-01 | Disposition: A | Discharge status: Home / Self Care | Payer: Medicare Other | Attending: Emergency Medicine | Admitting: Emergency Medicine

## 2011-10-01 DIAGNOSIS — I10 Essential (primary) hypertension: Secondary | ICD-10-CM | POA: Insufficient documentation

## 2011-10-01 DIAGNOSIS — R42 Dizziness and giddiness: Secondary | ICD-10-CM | POA: Insufficient documentation

## 2011-10-01 DIAGNOSIS — M25569 Pain in unspecified knee: Secondary | ICD-10-CM | POA: Insufficient documentation

## 2011-10-01 DIAGNOSIS — Z79899 Other long term (current) drug therapy: Secondary | ICD-10-CM | POA: Insufficient documentation

## 2011-10-01 DIAGNOSIS — E119 Type 2 diabetes mellitus without complications: Secondary | ICD-10-CM | POA: Insufficient documentation

## 2011-10-01 DIAGNOSIS — E860 Dehydration: Secondary | ICD-10-CM | POA: Insufficient documentation

## 2011-10-01 HISTORY — DX: Cerebral infarction, unspecified: I63.9

## 2011-10-01 LAB — DIFFERENTIAL
Basophils Absolute: 0 10*3/uL (ref 0.0–0.1)
Lymphocytes Relative: 22 % (ref 12–46)
Monocytes Absolute: 0.4 10*3/uL (ref 0.1–1.0)
Neutro Abs: 2.2 10*3/uL (ref 1.7–7.7)

## 2011-10-01 LAB — URINALYSIS, ROUTINE W REFLEX MICROSCOPIC
Glucose, UA: NEGATIVE mg/dL
Leukocytes, UA: NEGATIVE
Specific Gravity, Urine: 1.025 (ref 1.005–1.030)
pH: 7.5 (ref 5.0–8.0)

## 2011-10-01 LAB — URINE MICROSCOPIC-ADD ON

## 2011-10-01 LAB — CBC
HCT: 30.3 % — ABNORMAL LOW (ref 36.0–46.0)
RDW: 17.8 % — ABNORMAL HIGH (ref 11.5–15.5)
WBC: 3.4 10*3/uL — ABNORMAL LOW (ref 4.0–10.5)

## 2011-10-01 LAB — CARDIAC PANEL(CRET KIN+CKTOT+MB+TROPI)
CK, MB: 1.9 ng/mL (ref 0.3–4.0)
Relative Index: INVALID (ref 0.0–2.5)
Total CK: 67 U/L (ref 7–177)

## 2011-10-01 LAB — BASIC METABOLIC PANEL
CO2: 20 mEq/L (ref 19–32)
Chloride: 113 mEq/L — ABNORMAL HIGH (ref 96–112)
Sodium: 139 mEq/L (ref 135–145)

## 2011-10-01 MED ORDER — SODIUM CHLORIDE 0.9 % IV BOLUS (SEPSIS)
500.0000 mL | Freq: Once | INTRAVENOUS | Status: AC
  Administered 2011-10-01: 500 mL via INTRAVENOUS

## 2011-10-01 NOTE — ED Notes (Signed)
x1 unsuccessful IV attempt made. Eustace Quail RN in room to try to gain IV access.

## 2011-10-01 NOTE — ED Notes (Signed)
Pt reports falling from her bead last night.  Pt denies hitting her head, and also denies any pain to any area.  Pt denies feeling dizzy.  Pt states "my legs are just weak".

## 2011-10-01 NOTE — ED Provider Notes (Signed)
History     CSN: [id number] Arrival date & time: 10/01/2011  8:30 AM   First MD Initiated Contact with Patient 10/01/11 [phone]      Chief Complaint  Patient presents with  . Fall    (Consider location/radiation/quality/duration/timing/severity/associated sxs/prior treatment) Patient is a 70 y.o. female presenting with fall. The history is provided by the patient.  Fall The accident occurred 6 to 12 hours ago. The fall occurred while walking (She got out of bed last night to go to the bathroom,  and became lightheaded,  falling on her knees after several steps toward the bathroom.  She denies any pain or injury. ). She landed on carpet. The point of impact was the left knee and right knee. The pain is at a severity of 0/10. The patient is experiencing no pain. She was ambulatory at the scene. Pertinent negatives include no visual change, no fever, no numbness, no abdominal pain, no bowel incontinence, no nausea, no headaches, no loss of consciousness and no tingling. The symptoms are aggravated by activity. She has tried nothing for the symptoms.    Past Medical History  Diagnosis Date  . Diabetes mellitus   . Hypertension   . Stroke     Past Surgical History  Procedure Date  . Coronary artery bypass graft     No family history on file.  History  Substance Use Topics  . Smoking status: Current Everyday Smoker  . Smokeless tobacco: Not on file  . Alcohol Use: No    OB History    Grav Para Term Preterm Abortions TAB SAB Ect Mult Living                  Review of Systems  Constitutional: Negative for fever.  HENT: Negative for congestion, sore throat and neck pain.   Eyes: Negative.   Respiratory: Negative for chest tightness and shortness of breath.   Cardiovascular: Negative for chest pain.  Gastrointestinal: Negative for nausea, abdominal pain and bowel incontinence.  Genitourinary: Negative.   Musculoskeletal: Negative for joint swelling and arthralgias.  Skin:  Negative.  Negative for rash and wound.  Neurological: Positive for light-headedness. Negative for dizziness, tingling, loss of consciousness, weakness, numbness and headaches.  Hematological: Negative.   Psychiatric/Behavioral: Negative.     Allergies  Review of patient's allergies indicates no known allergies.  Home Medications   Current Outpatient Rx  Name Route Sig Dispense Refill  . ASPIRIN 325 MG PO TABS Oral Take 325 mg by mouth every 6 (six) hours as needed. For pain     . ASPIRIN-DIPYRIDAMOLE 25-200 MG PO CP12 Oral Take 1 capsule by mouth 2 (two) times daily.      Marland Kitchen DOCUSATE SODIUM 100 MG PO CAPS Oral Take 200 mg by mouth daily.      . ENALAPRIL MALEATE 5 MG PO TABS Oral Take 5 mg by mouth 2 (two) times daily.      . GLYBURIDE-METFORMIN 2.5-500 MG PO TABS Oral Take 0.5 tablets by mouth daily.      Marland Kitchen POLYSACCHARIDE IRON COMPLEX 150 MG PO CAPS Oral Take 150 mg by mouth daily.      Marland Kitchen METOPROLOL SUCCINATE 100 MG PO TB24 Oral Take 150 mg by mouth daily.      Marland Kitchen PRAVASTATIN SODIUM 20 MG PO TABS Oral Take 20 mg by mouth daily.        BP 101/84  Pulse 87  Temp(Src) 98.3 F (36.8 C) (Oral)  Resp 20  Ht 5\' 4"  (  1.626 m)  Wt 115 lb (52.164 kg)  BMI 19.74 kg/m2  SpO2 100%  Physical Exam  Nursing note and vitals reviewed. Constitutional: She is oriented to person, place, and time. She appears well-developed and well-nourished.       Uncomfortable appearing  HENT:  Head: Normocephalic and atraumatic.       Buccal mucosa and lips appear dry.  Eyes: Conjunctivae and EOM are normal. Pupils are equal, round, and reactive to light.  Neck: Normal range of motion. Neck supple.  Cardiovascular: Normal rate, regular rhythm, normal heart sounds and intact distal pulses.   Pulmonary/Chest: Effort normal and breath sounds normal. She has no wheezes.  Abdominal: Soft. Bowel sounds are normal. There is no tenderness.  Musculoskeletal: Normal range of motion.  Lymphadenopathy:    She has no  cervical adenopathy.  Neurological: She is alert and oriented to person, place, and time. She has normal strength. No cranial nerve deficit or sensory deficit. She displays a negative Romberg sign. Gait normal. GCS eye subscore is 4. GCS verbal subscore is 5. GCS motor subscore is 6.       Normal heel-shin, normal rapid alternating movements.  Skin: Skin is warm and dry. No rash noted.  Psychiatric: She has a normal mood and affect. Her speech is normal and behavior is normal. Thought content normal. Cognition and memory are normal.    ED Course  Procedures (including critical care time)  Labs Reviewed  CBC - Abnormal; Notable for the following:    WBC 3.4 (*)    RBC 3.68 (*)    Hemoglobin 9.7 (*)    HCT 30.3 (*)    RDW 17.8 (*)    All other components within normal limits  BASIC METABOLIC PANEL - Abnormal; Notable for the following:    Chloride 113 (*)    Glucose, Bld 107 (*)    BUN 30 (*)    Creatinine, Ser 1.26 (*)    GFR calc non Af Amer 42 (*)    GFR calc Af Amer 49 (*)    All other components within normal limits  URINALYSIS, ROUTINE W REFLEX MICROSCOPIC - Abnormal; Notable for the following:    Hgb urine dipstick TRACE (*)    Protein, ur 100 (*)    All other components within normal limits  URINE MICROSCOPIC-ADD ON - Abnormal; Notable for the following:    Squamous Epithelial / LPF MANY (*)    Bacteria, UA MANY (*)    All other components within normal limits  DIFFERENTIAL  CARDIAC PANEL(CRET KIN+CKTOT+MB+TROPI)   Dg Chest 2 View  10/01/2011  *RADIOLOGY REPORT*  Clinical Data: Fall  CHEST - 2 VIEW  Comparison: 04/03/2010  Findings: Enlargement of cardiac silhouette post CABG. Tortuous aorta. Minimally prominent superior mediastinal soft tissues stable since previous exam. Chronic bronchitic changes. No pulmonary infiltrate, pleural effusion, or pneumothorax. Bones appear demineralized. No acute fracture identified.  IMPRESSION: Mild enlargement of cardiac silhouette post  CABG. Chronic bronchitic changes. No acute abnormalities.  Original Report Authenticated By: Lollie Marrow, M.D.     No diagnosis found.  Date: 10/01/2011  1610  Rate:75  Rhythm: normal sinus rhythm  QRS Axis: normal  Intervals: normal  ST/T Wave abnormalities: nonspecific T wave changes  Conduction Disutrbances:first-degree A-V block   Narrative Interpretation:   Old EKG Reviewed: unchanged from 05/16/07   Orthostatic vital signs taken with significant drop in blood pressure with standing.  She was given a gentle 500 cc NS IV bolus.  After  was ambulated in dept,  Felt better,  Orthostatic changes resolved on recheck.  MDM  Dehydration.  Patient encourage to drink more fluids.  The patient appears reasonably screened and/or stabilized for discharge and I doubt any other medical condition or other [facility] requiring further screening, evaluation, or treatment in the ED at this time prior to discharge.  Patients labs and/or radiological studies were reviewed during the medical decision making and disposition process.          Candis Musa, PA 10/03/11 1654

## 2011-10-01 NOTE — ED Notes (Signed)
Pt and daughter reports pt was sitting at the table around 4 or 5 yesterday and her vision started "going."  Reports pt felt a little dizzy but was able to ambulate.  During the night pt got out of bed and fell.  Pt denies any pain anywhere.  PT says feels normal today but thinks bp is up.  Daughter says pt has had similar episodes when her bp is up.  Pt is a little unsteady on her feet per her daughter.

## 2011-10-01 NOTE — ED Notes (Signed)
Patient states she felt fine during orthostatics patient just wanted to know if she was going to be able to go home or not. Patient was given a meal tray.

## 2011-10-06 ENCOUNTER — Other Ambulatory Visit (HOSPITAL_COMMUNITY): Payer: Self-pay | Admitting: Family Medicine

## 2011-10-06 DIAGNOSIS — R531 Weakness: Secondary | ICD-10-CM

## 2011-10-06 DIAGNOSIS — R296 Repeated falls: Secondary | ICD-10-CM

## 2011-10-06 DIAGNOSIS — Z8673 Personal history of transient ischemic attack (TIA), and cerebral infarction without residual deficits: Secondary | ICD-10-CM

## 2011-10-06 NOTE — ED Provider Notes (Signed)
Medical screening examination/treatment/procedure(s) were performed by non-physician practitioner and as supervising physician I was immediately available for consultation/collaboration.  Nicoletta Dress. Colon Branch, MD 10/06/11 [phone]

## 2011-10-14 ENCOUNTER — Ambulatory Visit (HOSPITAL_COMMUNITY)
Admission: RE | Admit: 2011-10-14 | Discharge: 2011-10-14 | Disposition: A | Discharge status: Home / Self Care | Payer: Medicare Other | Source: Ambulatory Visit | Attending: Family Medicine | Admitting: Family Medicine

## 2011-10-14 ENCOUNTER — Other Ambulatory Visit (HOSPITAL_COMMUNITY): Payer: Self-pay | Admitting: Family Medicine

## 2011-10-14 DIAGNOSIS — R531 Weakness: Secondary | ICD-10-CM

## 2011-10-14 DIAGNOSIS — R4182 Altered mental status, unspecified: Secondary | ICD-10-CM | POA: Insufficient documentation

## 2011-10-14 DIAGNOSIS — Z8673 Personal history of transient ischemic attack (TIA), and cerebral infarction without residual deficits: Secondary | ICD-10-CM

## 2011-10-14 DIAGNOSIS — R296 Repeated falls: Secondary | ICD-10-CM

## 2011-10-14 DIAGNOSIS — R42 Dizziness and giddiness: Secondary | ICD-10-CM | POA: Insufficient documentation

## 2011-10-30 ENCOUNTER — Encounter (HOSPITAL_COMMUNITY): Payer: Self-pay | Admitting: *Deleted

## 2011-10-30 ENCOUNTER — Emergency Department (HOSPITAL_COMMUNITY)
Admission: EM | Admit: 2011-10-30 | Discharge: 2011-10-30 | Disposition: A | Discharge status: Home / Self Care | Payer: Medicare Other | Attending: Emergency Medicine | Admitting: Emergency Medicine

## 2011-10-30 DIAGNOSIS — Z951 Presence of aortocoronary bypass graft: Secondary | ICD-10-CM | POA: Insufficient documentation

## 2011-10-30 DIAGNOSIS — F17200 Nicotine dependence, unspecified, uncomplicated: Secondary | ICD-10-CM | POA: Insufficient documentation

## 2011-10-30 DIAGNOSIS — R141 Gas pain: Secondary | ICD-10-CM | POA: Insufficient documentation

## 2011-10-30 DIAGNOSIS — I10 Essential (primary) hypertension: Secondary | ICD-10-CM | POA: Insufficient documentation

## 2011-10-30 DIAGNOSIS — R143 Flatulence: Secondary | ICD-10-CM | POA: Insufficient documentation

## 2011-10-30 DIAGNOSIS — E119 Type 2 diabetes mellitus without complications: Secondary | ICD-10-CM | POA: Insufficient documentation

## 2011-10-30 DIAGNOSIS — Z8673 Personal history of transient ischemic attack (TIA), and cerebral infarction without residual deficits: Secondary | ICD-10-CM | POA: Insufficient documentation

## 2011-10-30 DIAGNOSIS — K5900 Constipation, unspecified: Secondary | ICD-10-CM | POA: Insufficient documentation

## 2011-10-30 DIAGNOSIS — R142 Eructation: Secondary | ICD-10-CM | POA: Insufficient documentation

## 2011-10-30 MED ORDER — POLYETHYLENE GLYCOL 3350 17 GM/SCOOP PO POWD: 17.0000 g | Freq: Every day | ORAL | Status: AC

## 2011-10-30 NOTE — ED Provider Notes (Signed)
I have personally seen and examined the patient.  I have discussed the plan of care with the resident.  I have reviewed the documentation on PMH/FH/Soc. History.  I have reviewed the documentation of the resident and agree.   Joya Gaskins, MD 10/30/11 2201

## 2011-10-30 NOTE — ED Provider Notes (Signed)
History     CSN: [id number] Arrival date & time: 10/30/2011  4:35 PM   First MD Initiated Contact with Patient 10/30/11 1646      Chief Complaint  Patient presents with  . Constipation   HPI Pt is a 70 year old female who presents today with one week of constipation.  Pt reports that, typically, she has two bowel movements per day but, for the past week, she has been having bowel movements approximately once every other day.  She has been feeling some abdominal distension and mild cramping along with a feeling of a need to defecate but is unable to do so.  She reports no blood in her stool, no problems with urination, no nausea/vomiting and a good appetite.  She reports having just had a bowel movement here in the ER.  Past Medical History  Diagnosis Date  . Diabetes mellitus   . Hypertension   . Stroke     Past Surgical History  Procedure Date  . Coronary artery bypass graft     History reviewed. No pertinent family history.  History  Substance Use Topics  . Smoking status: Current Everyday Smoker -- 1.0 packs/day    Types: Cigarettes  . Smokeless tobacco: Not on file  . Alcohol Use: No    OB History    Grav Para Term Preterm Abortions TAB SAB Ect Mult Living                  Review of Systems  Constitutional: Negative.   HENT: Negative.   Eyes: Negative.   Respiratory: Negative.   Cardiovascular: Negative.   Gastrointestinal: Positive for constipation and abdominal distention. Negative for nausea, vomiting, abdominal pain, diarrhea, blood in stool, anal bleeding and rectal pain.  Genitourinary: Negative.   Musculoskeletal: Negative.   Neurological: Negative.   Hematological: Negative.     Allergies  Review of patient's allergies indicates no known allergies.  Home Medications   Current Outpatient Rx  Name Route Sig Dispense Refill  . ASPIRIN 325 MG PO TABS Oral Take 325 mg by mouth every 6 (six) hours as needed. For pain     . ASPIRIN-DIPYRIDAMOLE  25-200 MG PO CP12 Oral Take 1 capsule by mouth 2 (two) times daily.      Marland Kitchen DOCUSATE SODIUM 100 MG PO CAPS Oral Take 200 mg by mouth daily.      . ENALAPRIL MALEATE 5 MG PO TABS Oral Take 5 mg by mouth 2 (two) times daily.      . GLYBURIDE-METFORMIN 2.5-500 MG PO TABS Oral Take 0.5 tablets by mouth daily.      Marland Kitchen POLYSACCHARIDE IRON COMPLEX 150 MG PO CAPS Oral Take 150 mg by mouth daily.      Marland Kitchen METOPROLOL SUCCINATE ER 100 MG PO TB24 Oral Take 150 mg by mouth daily.      Marland Kitchen PRAVASTATIN SODIUM 20 MG PO TABS Oral Take 20 mg by mouth daily.        BP 177/86  Pulse 76  Temp(Src) 98.3 F (36.8 C) (Oral)  Wt 130 lb (58.968 kg)  Physical Exam  Nursing note and vitals reviewed. Constitutional: She is oriented to person, place, and time. She appears well-developed and well-nourished. No distress.  HENT:  Head: Normocephalic and atraumatic.  Eyes: Conjunctivae and EOM are normal. Pupils are equal, round, and reactive to light.  Neck: Normal range of motion. Neck supple.  Cardiovascular: Normal rate, regular rhythm and normal heart sounds.   Pulmonary/Chest: Effort normal and  breath sounds normal.  Abdominal: Soft. Bowel sounds are normal. She exhibits no distension. There is no tenderness. There is no guarding.  Genitourinary: Rectal exam shows no external hemorrhoid, no internal hemorrhoid, no fissure, no mass and anal tone normal.       Mild tenderness and skin thinning on either side of rectum.  No masses or stool balls  Musculoskeletal: Normal range of motion. She exhibits no edema and no tenderness.  Neurological: She is alert and oriented to person, place, and time. No cranial nerve deficit.  Skin: Skin is warm and dry.    ED Course  Procedures (including critical care time)  Labs Reviewed - No data to display No results found.   No diagnosis found.    MDM  No evidence of stool impaction.  No evidence of acute abdominal pathology.  Will dc home with miralax and instructions to f/u  with PCP in next 2-3 days.        Majel Homer, MD 10/30/11 [phone]

## 2011-10-30 NOTE — ED Provider Notes (Signed)
Pt seen with resident She has constipation abd soft She is well appearing No active vomiting Doubt acute abd process  Joya Gaskins, MD 10/30/11 1720

## 2011-10-30 NOTE — ED Notes (Signed)
Pt c/o not having a BM x 3 days; pt states she had a very small one this am but not like her normal BM's; pt denies any abd pain

## 2012-01-25 ENCOUNTER — Encounter (HOSPITAL_COMMUNITY): Payer: Self-pay | Admitting: *Deleted

## 2012-01-25 ENCOUNTER — Other Ambulatory Visit: Payer: Self-pay

## 2012-01-25 ENCOUNTER — Inpatient Hospital Stay (HOSPITAL_COMMUNITY)
Admission: EM | Admit: 2012-01-25 | Discharge: 2012-01-30 | DRG: 065 | Disposition: A | Discharge status: Skilled Nursing Facility | Payer: Medicare Other | Attending: Internal Medicine | Admitting: Internal Medicine

## 2012-01-25 ENCOUNTER — Emergency Department (HOSPITAL_COMMUNITY): Payer: Medicare Other

## 2012-01-25 ENCOUNTER — Emergency Department (HOSPITAL_COMMUNITY)
Admission: EM | Admit: 2012-01-25 | Discharge: 2012-01-25 | Disposition: A | Discharge status: Home / Self Care | Payer: Medicare Other | Source: Home / Self Care | Attending: Emergency Medicine | Admitting: Emergency Medicine

## 2012-01-25 DIAGNOSIS — N390 Urinary tract infection, site not specified: Secondary | ICD-10-CM

## 2012-01-25 DIAGNOSIS — I2510 Atherosclerotic heart disease of native coronary artery without angina pectoris: Secondary | ICD-10-CM | POA: Diagnosis present

## 2012-01-25 DIAGNOSIS — R0989 Other specified symptoms and signs involving the circulatory and respiratory systems: Secondary | ICD-10-CM | POA: Insufficient documentation

## 2012-01-25 DIAGNOSIS — N189 Chronic kidney disease, unspecified: Secondary | ICD-10-CM | POA: Diagnosis present

## 2012-01-25 DIAGNOSIS — F17200 Nicotine dependence, unspecified, uncomplicated: Secondary | ICD-10-CM | POA: Diagnosis present

## 2012-01-25 DIAGNOSIS — R259 Unspecified abnormal involuntary movements: Secondary | ICD-10-CM | POA: Insufficient documentation

## 2012-01-25 DIAGNOSIS — I6350 Cerebral infarction due to unspecified occlusion or stenosis of unspecified cerebral artery: Principal | ICD-10-CM | POA: Diagnosis present

## 2012-01-25 DIAGNOSIS — D631 Anemia in chronic kidney disease: Secondary | ICD-10-CM | POA: Diagnosis present

## 2012-01-25 DIAGNOSIS — Z79899 Other long term (current) drug therapy: Secondary | ICD-10-CM | POA: Insufficient documentation

## 2012-01-25 DIAGNOSIS — I129 Hypertensive chronic kidney disease with stage 1 through stage 4 chronic kidney disease, or unspecified chronic kidney disease: Secondary | ICD-10-CM | POA: Diagnosis present

## 2012-01-25 DIAGNOSIS — G459 Transient cerebral ischemic attack, unspecified: Secondary | ICD-10-CM | POA: Insufficient documentation

## 2012-01-25 DIAGNOSIS — Z8673 Personal history of transient ischemic attack (TIA), and cerebral infarction without residual deficits: Secondary | ICD-10-CM | POA: Insufficient documentation

## 2012-01-25 DIAGNOSIS — D709 Neutropenia, unspecified: Secondary | ICD-10-CM | POA: Diagnosis present

## 2012-01-25 DIAGNOSIS — Z7982 Long term (current) use of aspirin: Secondary | ICD-10-CM

## 2012-01-25 DIAGNOSIS — Z833 Family history of diabetes mellitus: Secondary | ICD-10-CM

## 2012-01-25 DIAGNOSIS — I639 Cerebral infarction, unspecified: Secondary | ICD-10-CM | POA: Diagnosis present

## 2012-01-25 DIAGNOSIS — Z951 Presence of aortocoronary bypass graft: Secondary | ICD-10-CM

## 2012-01-25 DIAGNOSIS — N039 Chronic nephritic syndrome with unspecified morphologic changes: Secondary | ICD-10-CM | POA: Diagnosis present

## 2012-01-25 DIAGNOSIS — I10 Essential (primary) hypertension: Secondary | ICD-10-CM | POA: Insufficient documentation

## 2012-01-25 DIAGNOSIS — N179 Acute kidney failure, unspecified: Secondary | ICD-10-CM | POA: Diagnosis present

## 2012-01-25 DIAGNOSIS — Z823 Family history of stroke: Secondary | ICD-10-CM

## 2012-01-25 DIAGNOSIS — R197 Diarrhea, unspecified: Secondary | ICD-10-CM | POA: Insufficient documentation

## 2012-01-25 DIAGNOSIS — E119 Type 2 diabetes mellitus without complications: Secondary | ICD-10-CM | POA: Diagnosis present

## 2012-01-25 DIAGNOSIS — R4182 Altered mental status, unspecified: Secondary | ICD-10-CM | POA: Diagnosis present

## 2012-01-25 DIAGNOSIS — G40909 Epilepsy, unspecified, not intractable, without status epilepticus: Secondary | ICD-10-CM | POA: Diagnosis present

## 2012-01-25 DIAGNOSIS — E785 Hyperlipidemia, unspecified: Secondary | ICD-10-CM | POA: Diagnosis present

## 2012-01-25 DIAGNOSIS — I720 Aneurysm of carotid artery: Secondary | ICD-10-CM | POA: Diagnosis present

## 2012-01-25 LAB — CBC
HCT: 37.2 % (ref 36.0–46.0)
Hemoglobin: 11.8 g/dL — ABNORMAL LOW (ref 12.0–15.0)
MCH: 26.4 pg (ref 26.0–34.0)
MCHC: 31.7 g/dL (ref 30.0–36.0)
RBC: 4.47 MIL/uL (ref 3.87–5.11)

## 2012-01-25 LAB — GLUCOSE, CAPILLARY: Glucose-Capillary: 111 mg/dL — ABNORMAL HIGH (ref 70–99)

## 2012-01-25 LAB — COMPREHENSIVE METABOLIC PANEL
ALT: 16 U/L (ref 0–35)
AST: 22 U/L (ref 0–37)
Alkaline Phosphatase: 54 U/L (ref 39–117)
CO2: 17 mEq/L — ABNORMAL LOW (ref 19–32)
Chloride: 116 mEq/L — ABNORMAL HIGH (ref 96–112)
GFR calc Af Amer: 32 mL/min — ABNORMAL LOW (ref >90)
GFR calc non Af Amer: 28 mL/min — ABNORMAL LOW (ref >90)
Glucose, Bld: 135 mg/dL — ABNORMAL HIGH (ref 70–99)
Potassium: 4.3 mEq/L (ref 3.5–5.1)
Sodium: 142 mEq/L (ref 135–145)
Total Bilirubin: 0.2 mg/dL — ABNORMAL LOW (ref 0.3–1.2)

## 2012-01-25 LAB — DIFFERENTIAL
Basophils Relative: 1 % (ref 0–1)
Eosinophils Absolute: 0 10*3/uL (ref 0.0–0.7)
Eosinophils Relative: 1 % (ref 0–5)
Lymphocytes Relative: 23 % (ref 12–46)
Neutro Abs: 2.8 10*3/uL (ref 1.7–7.7)
Neutrophils Relative %: 68 % (ref 43–77)

## 2012-01-25 LAB — URINALYSIS, ROUTINE W REFLEX MICROSCOPIC
Glucose, UA: NEGATIVE mg/dL
Ketones, ur: NEGATIVE mg/dL
Leukocytes, UA: NEGATIVE
Nitrite: NEGATIVE
Specific Gravity, Urine: 1.01 (ref 1.005–1.030)
pH: 8 (ref 5.0–8.0)

## 2012-01-25 LAB — RAPID URINE DRUG SCREEN, HOSP PERFORMED
Barbiturates: NOT DETECTED
Benzodiazepines: NOT DETECTED

## 2012-01-25 LAB — POCT I-STAT TROPONIN I: Troponin i, poc: 0.04 ng/mL (ref 0.00–0.08)

## 2012-01-25 LAB — URINE MICROSCOPIC-ADD ON

## 2012-01-25 LAB — ETHANOL: Alcohol, Ethyl (B): <11 mg/dL (ref 0–11)

## 2012-01-25 MED ORDER — NITROFURANTOIN MACROCRYSTAL 100 MG PO CAPS: ORAL_CAPSULE | ORAL | Status: DC

## 2012-01-25 MED ORDER — ASPIRIN 325 MG PO TABS
325.0000 mg | ORAL_TABLET | Freq: Every day | ORAL | Status: DC
  Administered 2012-01-26 – 2012-01-30 (×5): 325 mg via ORAL
  Filled 2012-01-25 (×6): qty 1

## 2012-01-25 MED ORDER — NITROFURANTOIN MACROCRYSTAL 100 MG PO CAPS
100.0000 mg | ORAL_CAPSULE | Freq: Once | ORAL | Status: AC
  Administered 2012-01-25: 100 mg via ORAL
  Filled 2012-01-25: qty 1

## 2012-01-25 MED ORDER — METOPROLOL SUCCINATE ER 100 MG PO TB24
100.0000 mg | ORAL_TABLET | Freq: Two times a day (BID) | ORAL | Status: DC
  Administered 2012-01-25: 100 mg via ORAL
  Filled 2012-01-25 (×3): qty 1

## 2012-01-25 MED ORDER — ACETAMINOPHEN 650 MG RE SUPP: 650.0000 mg | RECTAL | Status: DC | PRN

## 2012-01-25 MED ORDER — ASPIRIN 300 MG RE SUPP
300.0000 mg | Freq: Every day | RECTAL | Status: DC
  Filled 2012-01-25 (×2): qty 1

## 2012-01-25 MED ORDER — ACETAMINOPHEN 325 MG PO TABS: 650.0000 mg | ORAL_TABLET | ORAL | Status: DC | PRN

## 2012-01-25 MED ORDER — INSULIN ASPART 100 UNIT/ML ~~LOC~~ SOLN
0.0000 [IU] | Freq: Three times a day (TID) | SUBCUTANEOUS | Status: DC
  Administered 2012-01-27 – 2012-01-29 (×3): 2 [IU] via SUBCUTANEOUS
  Administered 2012-01-29: 1 [IU] via SUBCUTANEOUS
  Filled 2012-01-25: qty 3

## 2012-01-25 MED ORDER — ASPIRIN 325 MG PO TABS
325.0000 mg | ORAL_TABLET | Freq: Every day | ORAL | Status: DC
  Administered 2012-01-25: 325 mg via ORAL
  Filled 2012-01-25 (×2): qty 1

## 2012-01-25 MED ORDER — SENNOSIDES-DOCUSATE SODIUM 8.6-50 MG PO TABS: 1.0000 | ORAL_TABLET | Freq: Every evening | ORAL | Status: DC | PRN

## 2012-01-25 MED ORDER — SODIUM CHLORIDE 0.9 % IV SOLN
500.0000 mg | Freq: Two times a day (BID) | INTRAVENOUS | Status: DC
  Administered 2012-01-26: 500 mg via INTRAVENOUS
  Filled 2012-01-25 (×2): qty 5

## 2012-01-25 MED ORDER — ONDANSETRON HCL 4 MG/2ML IJ SOLN: 4.0000 mg | Freq: Four times a day (QID) | INTRAMUSCULAR | Status: DC | PRN

## 2012-01-25 MED ORDER — DOCUSATE SODIUM 100 MG PO CAPS
100.0000 mg | ORAL_CAPSULE | Freq: Every day | ORAL | Status: DC
  Administered 2012-01-25 – 2012-01-27 (×2): 100 mg via ORAL
  Filled 2012-01-25 (×2): qty 1

## 2012-01-25 MED ORDER — ASPIRIN-DIPYRIDAMOLE ER 25-200 MG PO CP12
1.0000 | ORAL_CAPSULE | Freq: Two times a day (BID) | ORAL | Status: DC
  Administered 2012-01-25: 1 via ORAL
  Filled 2012-01-25 (×3): qty 1

## 2012-01-25 MED ORDER — NITROFURANTOIN MACROCRYSTAL 100 MG PO CAPS
100.0000 mg | ORAL_CAPSULE | Freq: Two times a day (BID) | ORAL | Status: DC
  Administered 2012-01-25 – 2012-01-27 (×5): 100 mg via ORAL
  Filled 2012-01-25 (×7): qty 1

## 2012-01-25 MED ORDER — HYDRALAZINE HCL 20 MG/ML IJ SOLN
10.0000 mg | Freq: Four times a day (QID) | INTRAMUSCULAR | Status: DC | PRN
  Filled 2012-01-25: qty 0.5

## 2012-01-25 MED ORDER — SIMVASTATIN 10 MG PO TABS
10.0000 mg | ORAL_TABLET | Freq: Every day | ORAL | Status: DC
  Administered 2012-01-27 – 2012-01-29 (×3): 10 mg via ORAL
  Filled 2012-01-25 (×5): qty 1

## 2012-01-25 MED ORDER — ENOXAPARIN SODIUM 40 MG/0.4ML ~~LOC~~ SOLN
40.0000 mg | SUBCUTANEOUS | Status: DC
  Administered 2012-01-25: 40 mg via SUBCUTANEOUS
  Filled 2012-01-25 (×2): qty 0.4

## 2012-01-25 MED ORDER — NICOTINE 21 MG/24HR TD PT24
21.0000 mg | MEDICATED_PATCH | Freq: Every day | TRANSDERMAL | Status: DC
  Administered 2012-01-25 – 2012-01-30 (×6): 21 mg via TRANSDERMAL
  Filled 2012-01-25 (×6): qty 1

## 2012-01-25 MED ORDER — POLYSACCHARIDE IRON COMPLEX 150 MG PO CAPS
150.0000 mg | ORAL_CAPSULE | Freq: Every day | ORAL | Status: DC
  Administered 2012-01-25 – 2012-01-30 (×6): 150 mg via ORAL
  Filled 2012-01-25 (×6): qty 1

## 2012-01-25 MED ORDER — INSULIN ASPART 100 UNIT/ML ~~LOC~~ SOLN: 0.0000 [IU] | Freq: Every day | SUBCUTANEOUS | Status: DC

## 2012-01-25 MED ORDER — SODIUM CHLORIDE 0.9 % IV SOLN
1000.0000 mg | INTRAVENOUS | Status: AC
  Administered 2012-01-26: 1000 mg via INTRAVENOUS
  Filled 2012-01-25: qty 10

## 2012-01-25 NOTE — ED Notes (Signed)
This pt was just discharged from [facility] ed with some type jerking episode earlier today.  On the way home from there she started jerking again and the family was not happy with their diagnosis and brought her here.  She says she feels ok now.  The pt is alert no distress.  No seizure history

## 2012-01-25 NOTE — Consult Note (Addendum)
Reason for Consult: Seizure Referring Physician: Dr. lawyer  CC: Seizure/stroke  HPI: [name] is an 71 y.o. female black presenting in the ER with a seizure. The patient has family in the room her helping with the history. Her daughter tells me that this morning the patient was sitting in a chair and suddenly started shaking. She said that her head was turned to the right and her eyes were turned to the right. Her head started shaking to the right several times. This lasted for less than a minute she believes. No other parts of her body were shaking. The patient did have urine incontinence afterwards. She seemed a little bit confused afterwards but did not complain of a headache nor did she have her tongue bite. The patient slept but she sleeps a lot according to the daughter. The patient has never had this happen before. She did not feel it coming on either. The patient currently feels fine. She does not having numbness or tingling anywhere. She does not have any problems with swallowing or any trouble with vision. The patient does not have any weakness in her face arms or legs. She does not have a headache. The patient does not have dizziness/vertigo/lightheadedness. Patient also does not have any difficulty with her speech. The patient had gone to [facility] hospital earlier today before the above episode. She was taken to the emergency department by her family because of altered mental status and an episode of shaking.  The patient was discharged after a normal CT scan and normal labs.  The patient takes an aspirin occasionally. The last time  she took an aspirin was Wednesday which was about 4 days ago.  LSN: before 10am  01/25/2012 tpa not given as pt outside time window for treatment and probable seizure with onset of stroke.   Past Medical History  Diagnosis Date  . Diabetes mellitus   . Hypertension   . Stroke     Past Surgical History  Procedure Date  . Coronary artery bypass  graft     Family History  Problem Relation Age of Onset  . Stroke Father   . Diabetes type II Mother     Social History:  reports that she has been smoking Cigarettes.  She has a 50 pack-year smoking history. She does not have any smokeless tobacco history on file. She reports that she does not drink alcohol or use illicit drugs.  No Known Allergies  Medications: I have reviewed the patient's current medications.  ROS: The patient denies chest pain, abdominal pain, fever, chills. She denies any muscle aches. The patient denies shortness of breath palpitations or diaphoresis. Patient also denies any blood in her bowel movements or urine. She does not have diarrhea or pain with urination. The patient does not have a cough. Physical Examination: Blood pressure 144/82, pulse 70, temperature 97.9 F (36.6 C), temperature source Oral, resp. rate 16, SpO2 100.00%. General: Heart tones S1 and S2 are heard. No S3/S4/carotid bruits or murmurs. The patient's heart rate is regular but heart rhythm is irregular Lungs: Clear to auscultation bilaterally in the front fields abdomen: Soft HEENT: The patient has an old laceration on her bottom lip which her daughter tells me is from smoking cigarettes to closely to the filter or edge Neurologic Examination Naming/repetition/comprehension intact and speech is fluent. Funduscopic exam is normal with the sharp disc edges and venous pulsations. No visual field deficits. Pupils are equal round and reactive to light. Extraocular movements are intact.  No facial droop. V1 through V3 intact to soft touch and pinprick. Hearing grossly intact. Pharynx arch symmetric in stand and elevation. SCM/trapezius 5/5. Tongue midline Motor: 5/5, pronator and downward drift right upper extremity, normal tone Sensory: Intact to soft touch and pinprick all 4 limbs Coordination: Finger-nose-finger and heel-knee-shin within normal limits Reflexes: Slightly elevated on the right  upper lower extremity with equivocal PFR Results for orders placed during the hospital encounter of 01/25/12 (from the past 48 hour(s))  POCT I-STAT TROPONIN I     Status: Normal   Collection Time   01/25/12  6:23 PM      Component Value Range Comment   Troponin i, poc 0.04  0.00 - 0.08 (ng/mL)    Comment 3              No results found for this or any previous visit (from the past 240 hour(s)).  Dg Chest 2 View  01/25/2012  *RADIOLOGY REPORT*  Clinical Data: Stroke, history diabetes, hypertension, smoking, CABG  CHEST - 2 VIEW  Comparison: 10/01/2011  Findings: Enlargement of cardiac silhouette post CABG. Tortuous aorta. Pulmonary vascularity normal. Minimally prominent right paratracheal soft tissues stable. Lungs clear. No pleural effusion or pneumothorax. No acute osseous findings.  IMPRESSION: Enlargement of cardiac silhouette post CABG. No acute abnormalities.  Original Report Authenticated By: Lollie Marrow, M.D.   Ct Head Wo Contrast  01/25/2012  *RADIOLOGY REPORT*  Clinical Data: Altered mental status.  CT HEAD WITHOUT CONTRAST  Technique:  Contiguous axial images were obtained from the base of the skull through the vertex without contrast.  Comparison: 05/16/2007  Findings: There is moderate central and cortical atrophy. Periventricular white matter changes are consistent with small vessel disease.  Multiple old lacunar infarctions are identified within the basal ganglia and thalami bilaterally.  Chronic infarct is identified within the right occipital lobe, new since the prior study.  Bone windows show no acute abnormalities.  IMPRESSION:  1.  Significant atrophy and small vessel disease. 2.  Old infarcts involving the basal ganglia bilaterally. 3.  Interval but chronic infarct involving the right occipital lobe. 4. No evidence for acute intracranial abnormality.  Original Report Authenticated By: Patterson Hammersmith, M.D.   Mr Brain Wo Contrast  01/25/2012  *RADIOLOGY REPORT*  Clinical Data:  Weakness and shaking.  Confusion.  Altered mental status.  MRI HEAD WITHOUT CONTRAST  Technique:  Multiplanar, multiecho pulse sequences of the brain and surrounding structures were obtained according to standard protocol without intravenous contrast.  Comparison: CT 01/25/2012, MRI 10/14/2011  Findings: Small acute infarct left frontal cortex over the convexity.  No other acute infarct.  Moderate atrophy.  Extensive chronic ischemic change throughout the basal ganglia bilaterally.  Interval development of infarct in the right inferior occipital lobe since the prior MRI.  This has a chronic appearance.  Small chronic infarct left parietal lobe is unchanged.  Negative for hemorrhage or mass lesion.  Paranasal sinuses are clear.  IMPRESSION: Sub centimeter focus of acute infarct in the high left frontal lobe.  Original Report Authenticated By: Camelia Phenes, M.D.     Assessment/Plan: 71 year old black female past medical history diabetes, hypertension, stroke, coronary artery disease status post bypass surgery, and tobacco dependent presenting in the ER with a seizure. Physical exam is localizing. MRI of the brain shows small acute infarct left frontal lobe. 1. Stroke workup 2. Continue aspirin on a daily basis, discussed with patient and family 3. Counseled on smoking cessation 4. Patient not treated  with TPA as she was outside the time window and probable seizure with onset of stroke  Kelliann Pendergraph Christophe Louis, MD [facility]  01/25/2012, 9:34 PM

## 2012-01-25 NOTE — ED Notes (Signed)
Pt on stretcher, nad noted, here from [facility] there was diagnosised with tia and uti and on way home had a second jerking spell and sts they want a second opinion. Pt alert and orient, when this spell occurs pt has no post ictal state, no incontinence.

## 2012-01-25 NOTE — ED Provider Notes (Signed)
History   This chart was scribed for [employer]. Colon Branch, MD by Clarita Crane. The patient was seen in room APA07/APA07. Patient's care was started at 1006.    CSN: [id number]  Arrival date & time 01/25/12  1006   First MD Initiated Contact with Patient 01/25/12 1029      Chief Complaint  Patient presents with  . Altered Mental Status    (Consider location/radiation/quality/duration/timing/severity/associated sxs/prior treatment) HPI [name] is a 71 y.o. female who presents to the Emergency Department accompanied by family member who states patient experienced an episode of involuntary shaking this morning while sitting in a chair which has currently resolved. Family member notes episode lasted several minutes and then resolved on its own. Denies associated nausea, vomiting, HA, visual changes, dysphagia, weakness during or following episode. Denies h/o seizures. Family member states patient recently had medication changed by [name] as she was recently started on Benicar.  Family member notes patient experienced and episode of diarrhea yesterday but has had no additional episodes since. Patient with h/o diabetes, HTN, stroke.   PCP- [facility]  Past Medical History  Diagnosis Date  . Diabetes mellitus   . Hypertension   . Stroke     Past Surgical History  Procedure Date  . Coronary artery bypass graft     History reviewed. No pertinent family history.  History  Substance Use Topics  . Smoking status: Current Everyday Smoker -- 1.0 packs/day    Types: Cigarettes  . Smokeless tobacco: Not on file  . Alcohol Use: No    OB History    Grav Para Term Preterm Abortions TAB SAB Ect Mult Living                  Review of Systems 10 Systems reviewed and are negative for acute change except as noted in the HPI.  Allergies  Review of patient's allergies indicates no known allergies.  Home Medications   Current Outpatient Rx  Name Route Sig Dispense Refill  . ASPIRIN 325  MG PO TABS Oral Take 325 mg by mouth daily.     . ASPIRIN-DIPYRIDAMOLE ER 25-200 MG PO CP12 Oral Take 1 capsule by mouth 2 (two) times daily.      Marland Kitchen DOCUSATE SODIUM 100 MG PO CAPS Oral Take 100 mg by mouth daily. constipation    . GLYBURIDE-METFORMIN 2.5-500 MG PO TABS Oral Take 0.5 tablets by mouth daily with breakfast.     . POLYSACCHARIDE IRON COMPLEX 150 MG PO CAPS Oral Take 150 mg by mouth daily.      Marland Kitchen METOPROLOL SUCCINATE ER 100 MG PO TB24 Oral Take 100 mg by mouth 2 (two) times daily.     Marland Kitchen PRAVASTATIN SODIUM 20 MG PO TABS Oral Take 20 mg by mouth daily.        BP 137/71  Pulse 70  Temp(Src) 98 F (36.7 C) (Oral)  Resp 21  Ht 5\' 3"  (1.6 m)  Wt 130 lb (58.968 kg)  BMI 23.03 kg/m2  SpO2 99%  Physical Exam  Nursing note and vitals reviewed. Constitutional: She appears well-developed and well-nourished. No distress.  HENT:  Head: Normocephalic and atraumatic.       Healing burn to left lower lip.   Eyes: EOM are normal. Pupils are equal, round, and reactive to light.  Neck: Neck supple. No tracheal deviation present.  Cardiovascular: Normal rate and regular rhythm.  Exam reveals no gallop and no friction rub.   No murmur heard. Pulmonary/Chest: Effort  normal. No respiratory distress. She has no wheezes. She has no rales.       Crackles at bilateral bases.   Abdominal: Soft. She exhibits no distension.  Musculoskeletal: Normal range of motion. She exhibits no edema.  Neurological: She is alert. No cranial nerve deficit or sensory deficit.       Grip strength normal and equal bilaterally. Answers questions appropriately.    Skin: Skin is warm and dry.  Psychiatric: She has a normal mood and affect. Her behavior is normal.    ED Course  Procedures (including critical care time)  DIAGNOSTIC STUDIES: Oxygen Saturation is 99% on room air, normal by my interpretation.    COORDINATION OF CARE: 10:41AM- Patient informed of current plan for treatment and evaluation and agrees  with plan at this time.  12:48PM- Patient and family member informed of current lab and imaging results. Will d/c home with prescription for abx. Patient and family agree with plan.    Results for orders placed during the hospital encounter of 01/25/12  GLUCOSE, CAPILLARY      Component Value Range   Glucose-Capillary 111 (*) 70 - 99 (mg/dL)  CBC      Component Value Range   WBC 4.0  4.0 - 10.5 (K/uL)   RBC 4.47  3.87 - 5.11 (MIL/uL)   Hemoglobin 11.8 (*) 12.0 - 15.0 (g/dL)   HCT 16.1  09.6 - 04.5 (%)   MCV 83.2  78.0 - 100.0 (fL)   MCH 26.4  26.0 - 34.0 (pg)   MCHC 31.7  30.0 - 36.0 (g/dL)   RDW 40.9 (*) 81.1 - 15.5 (%)   Platelets 169  150 - 400 (K/uL)  DIFFERENTIAL      Component Value Range   Neutrophils Relative 68  43 - 77 (%)   Lymphocytes Relative 23  12 - 46 (%)   Monocytes Relative 7  3 - 12 (%)   Eosinophils Relative 1  0 - 5 (%)   Basophils Relative 1  0 - 1 (%)   Neutro Abs 2.8  1.7 - 7.7 (K/uL)   Lymphs Abs 0.9  0.7 - 4.0 (K/uL)   Monocytes Absolute 0.3  0.1 - 1.0 (K/uL)   Eosinophils Absolute 0.0  0.0 - 0.7 (K/uL)   Basophils Absolute 0.0  0.0 - 0.1 (K/uL)   WBC Morphology WHITE COUNT CONFIRMED ON SMEAR     Smear Review LARGE PLATELETS PRESENT    COMPREHENSIVE METABOLIC PANEL      Component Value Range   Sodium 142  135 - 145 (mEq/L)   Potassium 4.3  3.5 - 5.1 (mEq/L)   Chloride 116 (*) 96 - 112 (mEq/L)   CO2 17 (*) 19 - 32 (mEq/L)   Glucose, Bld 135 (*) 70 - 99 (mg/dL)   BUN 73 (*) 6 - 23 (mg/dL)   Creatinine, Ser 9.14 (*) 0.50 - 1.10 (mg/dL)   Calcium 8.6  8.4 - 78.2 (mg/dL)   Total Protein 9.8 (*) 6.0 - 8.3 (g/dL)   Albumin 3.3 (*) 3.5 - 5.2 (g/dL)   AST 22  0 - 37 (U/L)   ALT 16  0 - 35 (U/L)   Alkaline Phosphatase 54  39 - 117 (U/L)   Total Bilirubin 0.2 (*) 0.3 - 1.2 (mg/dL)   GFR calc non Af Amer 28 (*) >90 (mL/min)   GFR calc Af Amer 32 (*) >90 (mL/min)  URINE RAPID DRUG SCREEN (HOSP PERFORMED)      Component Value Range   Opiates  NONE  DETECTED  NONE DETECTED    Cocaine NONE DETECTED  NONE DETECTED    Benzodiazepines NONE DETECTED  NONE DETECTED    Amphetamines NONE DETECTED  NONE DETECTED    Tetrahydrocannabinol NONE DETECTED  NONE DETECTED    Barbiturates NONE DETECTED  NONE DETECTED   URINALYSIS, ROUTINE W REFLEX MICROSCOPIC      Component Value Range   Color, Urine YELLOW  YELLOW    APPearance CLEAR  CLEAR    Specific Gravity, Urine 1.010  1.005 - 1.030    pH 8.0  5.0 - 8.0    Glucose, UA NEGATIVE  NEGATIVE (mg/dL)   Hgb urine dipstick TRACE (*) NEGATIVE    Bilirubin Urine NEGATIVE  NEGATIVE    Ketones, ur NEGATIVE  NEGATIVE (mg/dL)   Protein, ur 160 (*) NEGATIVE (mg/dL)   Urobilinogen, UA 0.2  0.0 - 1.0 (mg/dL)   Nitrite NEGATIVE  NEGATIVE    Leukocytes, UA NEGATIVE  NEGATIVE   ETHANOL      Component Value Range   Alcohol, Ethyl (B) <11  0 - 11 (mg/dL)  URINE MICROSCOPIC-ADD ON      Component Value Range   Squamous Epithelial / LPF MANY (*) RARE    WBC, UA 11-20  <3 (WBC/hpf)   RBC / HPF 7-10  <3 (RBC/hpf)   Bacteria, UA MANY (*) RARE     Ct Head Wo Contrast  01/25/2012  *RADIOLOGY REPORT*  Clinical Data: Altered mental status.  CT HEAD WITHOUT CONTRAST  Technique:  Contiguous axial images were obtained from the base of the skull through the vertex without contrast.  Comparison: 05/16/2007  Findings: There is moderate central and cortical atrophy. Periventricular white matter changes are consistent with small vessel disease.  Multiple old lacunar infarctions are identified within the basal ganglia and thalami bilaterally.  Chronic infarct is identified within the right occipital lobe, new since the prior study.  Bone windows show no acute abnormalities.  IMPRESSION:  1.  Significant atrophy and small vessel disease. 2.  Old infarcts involving the basal ganglia bilaterally. 3.  Interval but chronic infarct involving the right occipital lobe. 4. No evidence for acute intracranial abnormality.  Original Report  Authenticated By: Patterson Hammersmith, M.D.     Date: 01/25/2012  1024  Rate:74  Rhythm: normal sinus rhythm and with PACs  QRS Axis: normal  Intervals: normal  ST/T Wave abnormalities: normal  Conduction Disutrbances:none  Narrative Interpretation:   Old EKG Reviewed: changes noted c/w 10/01/11, No longer has 1st degree AV block     MDM  Patient brought in by daughter for an episode that looked like a seizure. The patient was standing at the time and did not lose consciousness or fall. Patient has a h/o stroke and TIAs. She has been alert and oriented in the ER. CT head shows previous infarcts. No acute findings. EKG is normal. UA with evidence of early infection. Initiated antibiotics. Reviewed all labs, CT findings with both the patient and her daughter. .Pt stable in ED with no significant deterioration in condition.The patient appears reasonably screened and/or stabilized for discharge and I doubt any other medical condition or other [facility] requiring further screening, evaluation, or treatment in the ED at this time prior to discharge.   I personally performed the services described in this documentation, which was scribed in my presence. The recorded information has been reviewed and considered.   MDM Reviewed: nursing note and vitals Interpretation: labs, ECG and CT scan  Nicoletta Dress. Colon Branch, MD 01/25/12 1336

## 2012-01-25 NOTE — H&P (Addendum)
DATE OF ADMISSION:  01/25/2012  PCP:    Evlyn Courier, MD, MD   Chief Complaint: Confusion and shaking all over for a minute   HPI: [name] is an 71 y.o. female who was taken by her family to the [facility] ED after an episode of confusion, staring off into space and shaking all over.  The daughter is at the bedside who witnessed the event and stated it lasted about 1 minute.  She was taken to the ED in the AM and a workup which included a CT scan of the head, labs including a Urinalysis were all negative and she was discharged to home.  Shortly after arriving home, she had another episode of the same, so her family took her to [facility] ED for further evaluation, where an MRI of the brain was performed and revealed an acute Left frontal area Cerebral infarction.  She was referred for medical admission, and neurology was consulted.    Past Medical History  Diagnosis Date  . Diabetes mellitus   . Hypertension   . Stroke     Past Surgical History  Procedure Date  . Coronary artery bypass graft     Medications:  HOME MEDS: Prior to Admission medications   Medication Sig Start Date End Date Taking? Authorizing Provider  aspirin 325 MG tablet Take 325 mg by mouth daily.    Yes Historical Provider, MD  dipyridamole-aspirin (AGGRENOX) 25-200 MG per 12 hr capsule Take 1 capsule by mouth 2 (two) times daily.     Yes Historical Provider, MD  docusate sodium (COLACE) 100 MG capsule Take 100 mg by mouth daily. constipation   Yes Historical Provider, MD  glyBURIDE-metformin (GLUCOVANCE) 2.5-500 MG per tablet Take 0.5 tablets by mouth daily with breakfast.    Yes Historical Provider, MD  iron polysaccharides (NIFEREX) 150 MG capsule Take 150 mg by mouth daily.     Yes Historical Provider, MD  metoprolol (TOPROL-XL) 100 MG 24 hr tablet Take 100 mg by mouth 2 (two) times daily.    Yes Historical Provider, MD  nitrofurantoin (MACRODANTIN) 100 MG capsule Take 100 mg by mouth 2 (two) times  daily. For 5 days. Will start at home on 3/4   Yes Historical Provider, MD  pravastatin (PRAVACHOL) 20 MG tablet Take 20 mg by mouth daily.     Yes Historical Provider, MD    Allergies:  No Known Allergies  Social History:   reports that she has been smoking Cigarettes.  She has a 50 pack-year smoking history. She does not have any smokeless tobacco history on file. She reports that she does not drink alcohol or use illicit drugs.  Family History: Family History  Problem Relation Age of Onset  . Stroke Father   . Diabetes type II Mother     Review of Systems:  The patient denies anorexia, fever, weight loss, vision loss, decreased hearing, hoarseness, chest pain, syncope, dyspnea on exertion, peripheral edema, balance deficits, hemoptysis, abdominal pain, melena, hematochezia, severe indigestion/heartburn, hematuria, incontinence, genital sores, muscle weakness, suspicious skin lesions, transient blindness, difficulty walking, depression, unusual weight change, abnormal bleeding, enlarged lymph nodes, angioedema, and breast masses.   Physical Exam:  GEN:  Pleasant thin elderly African American female examined  and in no acute distress; cooperative with exam Filed Vitals:   01/25/12 1815 01/25/12 1830 01/25/12 1839 01/25/12 2005  BP: 171/81 144/82    Pulse: 74 70    Temp:   98.7 F (37.1 C) 97.9 F (36.6  C)  TempSrc:   Oral   Resp: 22 16    SpO2: 100% 100%     Blood pressure 144/82, pulse 70, temperature 97.9 F (36.6 C), temperature source Oral, resp. rate 16, SpO2 100.00%. PSYCH: She is alert and oriented x4; does not appear anxious does not appear depressed; affect is normal HEENT: Normocephalic and Atraumatic, Mucous membranes pink; PERRLA; EOM intact; Fundi:  Benign;  No scleral icterus, Nares: Patent, Oropharynx: Clear, Poor Dentition, Neck:  FROM, no cervical lymphadenopathy nor thyromegaly or carotid bruit; no JVD; Breasts:: Not examined CHEST WALL: No tenderness CHEST:  Normal respiration, clear to auscultation bilaterally HEART: Regular rate and rhythm; no murmurs rubs or gallops BACK: No kyphosis or scoliosis; no CVA tenderness ABDOMEN: Positive Bowel Sounds, Scaphoid,  Soft non-tender; no masses, no organomegaly.   Rectal Exam: Not done EXTREMITIES: No bone or joint deformity; age-appropriate arthropathy of the hands and knees; no cyanosis, clubbing or edema; no ulcerations. Genitalia: not examined PULSES: 2+ and symmetric SKIN: Normal hydration no rash or ulceration CNS: Cranial nerves 2-12 grossly intact no focal neurologic deficit   Labs & Imaging Results for orders placed during the hospital encounter of 01/25/12 (from the past 48 hour(s))  POCT I-STAT TROPONIN I     Status: Normal   Collection Time   01/25/12  6:23 PM      Component Value Range Comment   Troponin i, poc 0.04  0.00 - 0.08 (ng/mL)    Comment 3             Ct Head Wo Contrast  01/25/2012  *RADIOLOGY REPORT*  Clinical Data: Altered mental status.  CT HEAD WITHOUT CONTRAST  Technique:  Contiguous axial images were obtained from the base of the skull through the vertex without contrast.  Comparison: 05/16/2007  Findings: There is moderate central and cortical atrophy. Periventricular white matter changes are consistent with small vessel disease.  Multiple old lacunar infarctions are identified within the basal ganglia and thalami bilaterally.  Chronic infarct is identified within the right occipital lobe, new since the prior study.  Bone windows show no acute abnormalities.  IMPRESSION:  1.  Significant atrophy and small vessel disease. 2.  Old infarcts involving the basal ganglia bilaterally. 3.  Interval but chronic infarct involving the right occipital lobe. 4. No evidence for acute intracranial abnormality.  Original Report Authenticated By: Patterson Hammersmith, M.D.   Mr Brain Wo Contrast  01/25/2012  *RADIOLOGY REPORT*  Clinical Data: Weakness and shaking.  Confusion.  Altered mental  status.  MRI HEAD WITHOUT CONTRAST  Technique:  Multiplanar, multiecho pulse sequences of the brain and surrounding structures were obtained according to standard protocol without intravenous contrast.  Comparison: CT 01/25/2012, MRI 10/14/2011  Findings: Small acute infarct left frontal cortex over the convexity.  No other acute infarct.  Moderate atrophy.  Extensive chronic ischemic change throughout the basal ganglia bilaterally.  Interval development of infarct in the right inferior occipital lobe since the prior MRI.  This has a chronic appearance.  Small chronic infarct left parietal lobe is unchanged.  Negative for hemorrhage or mass lesion.  Paranasal sinuses are clear.  IMPRESSION: Sub centimeter focus of acute infarct in the high left frontal lobe.  Original Report Authenticated By: Camelia Phenes, M.D.    EKG:  Normal Sinus Rhythm with a 1*AV Block No ischemic changes, and diffuse artifact seen.     Assessment/Plan: Present on Admission:  .Stroke .Hypertension .Diabetes mellitus .Seizure-like activity .ARF (acute renal failure)  Plan:      Admit to Neuro Telemetry Floor CVA Protocol On Aggrenox and Aspirin therapy already, Neuro to advise changes.   Send for EEG in AM to evaluate for Seizure activity Start IV Keppra therapy Carotid US also ordered PT/OT/ and Speech evaluations   Check Lipids, TSH and HBA1C Reconcile Home Medications. Gentle IVFs, Monitor Electrolytes and BUN/Cr Smoking Cessation Counseling ordered Nicotine patch daily while hospitalized SSI Coverage PRN, Hold Metformin therapy.   DVT prophylaxis Other plans as per orders.    CODE STATUS:      FULL CODE           Tymel Conely C 01/25/2012, 9:23 PM

## 2012-01-25 NOTE — ED Notes (Signed)
Pt taken to mri

## 2012-01-25 NOTE — ED Notes (Signed)
Pt has crusty area to lip. States she had an ? Allergic reaction to a cigarette

## 2012-01-25 NOTE — ED Provider Notes (Signed)
History     CSN: [id number]  Arrival date & time 01/25/12  1540   First MD Initiated Contact with Patient 01/25/12 1557      Chief Complaint  Patient presents with  . poss seizure     (Consider location/radiation/quality/duration/timing/severity/associated sxs/prior treatment) HPI  Patient since the emergency department after being discharged from [facility].  Family states that at [facility] she had CT scan and blood work.  There was states when they got home that she started having this shaking episode again.  The daughter states that she hugged her and immediately stopped the episodes.  Patient states that she does not remember what happened during these episodes.  Patient denies having any pain in any part of her body or weakness at this time.  Patient can tell me the day of the week but is unable to tell me the month or the year.  The daughter states that this is normal for her.  Patient denies shortness of breath, chest pain, vomiting, nausea, abdominal pain, headache, visual changes, or fever.       Past Medical History  Diagnosis Date  . Diabetes mellitus   . Hypertension   . Stroke     Past Surgical History  Procedure Date  . Coronary artery bypass graft     History reviewed. No pertinent family history.  History  Substance Use Topics  . Smoking status: Current Everyday Smoker -- 1.0 packs/day    Types: Cigarettes  . Smokeless tobacco: Not on file  . Alcohol Use: No    OB History    Grav Para Term Preterm Abortions TAB SAB Ect Mult Living                  Review of Systems All pertinent positives and negatives reviewed in the history of present illness  Allergies  Review of patient's allergies indicates no known allergies.  Home Medications   Current Outpatient Rx  Name Route Sig Dispense Refill  . ASPIRIN 325 MG PO TABS Oral Take 325 mg by mouth daily.     . ASPIRIN-DIPYRIDAMOLE ER 25-200 MG PO CP12 Oral Take 1 capsule by mouth  2 (two) times daily.      Marland Kitchen DOCUSATE SODIUM 100 MG PO CAPS Oral Take 100 mg by mouth daily. constipation    . GLYBURIDE-METFORMIN 2.5-500 MG PO TABS Oral Take 0.5 tablets by mouth daily with breakfast.     . POLYSACCHARIDE IRON COMPLEX 150 MG PO CAPS Oral Take 150 mg by mouth daily.      Marland Kitchen METOPROLOL SUCCINATE ER 100 MG PO TB24 Oral Take 100 mg by mouth 2 (two) times daily.     Marland Kitchen NITROFURANTOIN MACROCRYSTAL 100 MG PO CAPS Oral Take 100 mg by mouth 2 (two) times daily. For 5 days. Will start at home on 3/4    . PRAVASTATIN SODIUM 20 MG PO TABS Oral Take 20 mg by mouth daily.        BP 176/78  Pulse 73  Temp(Src) 97.6 F (36.4 C) (Oral)  Resp 18  SpO2 99%  Physical Exam  Nursing note and vitals reviewed. Constitutional: She appears well-developed and well-nourished.  HENT:  Head: Normocephalic and atraumatic.  Eyes: EOM are normal. Pupils are equal, round, and reactive to light.  Neck: Normal range of motion. Neck supple.  Cardiovascular: Normal rate, regular rhythm and normal heart sounds.  Exam reveals no gallop and no friction rub.   No murmur heard. Pulmonary/Chest: Effort normal  and breath sounds normal. No respiratory distress. She has no rales.  Abdominal: Soft. Bowel sounds are normal. She exhibits no distension. There is no tenderness. There is no rebound.  Neurological: She is alert. She has normal strength. No sensory deficit.       Patient has normal grip strength and normal lower extremity strength.  Patient has fine motor that is intact with heel to shin testing.  Patient is able to tell me the day to week and not the month or year, she is able to me her birth date.  Daughter states this is normal for her.  Skin: Skin is warm and dry. No rash noted.    ED Course  Procedures (including critical care time)  Labs Reviewed - No data to display Ct Head Wo Contrast  01/25/2012  *RADIOLOGY REPORT*  Clinical Data: Altered mental status.  CT HEAD WITHOUT CONTRAST  Technique:   Contiguous axial images were obtained from the base of the skull through the vertex without contrast.  Comparison: 05/16/2007  Findings: There is moderate central and cortical atrophy. Periventricular white matter changes are consistent with small vessel disease.  Multiple old lacunar infarctions are identified within the basal ganglia and thalami bilaterally.  Chronic infarct is identified within the right occipital lobe, new since the prior study.  Bone windows show no acute abnormalities.  IMPRESSION:  1.  Significant atrophy and small vessel disease. 2.  Old infarcts involving the basal ganglia bilaterally. 3.  Interval but chronic infarct involving the right occipital lobe. 4. No evidence for acute intracranial abnormality.  Original Report Authenticated By: Patterson Hammersmith, M.D.    16:31-Patient has been seen, assessed, current  plan given, questions answered and needs addressed at this time. 16:42- I reviewed the patient's chart from earlier today at [facility] and her testing.  We will MR based on the fact that she has had these abnormal episodes and other testing as ordered and performed without indication for why she has had this sudden change.  7:59 PM The patient has a small infarct noted on MRI.  I spoke with neurology and they will consult on the patient they would like to have the hospitalist admit. MDM  MDM Reviewed: nursing note and vitals Interpretation: labs, ECG and MRI Consults: admitting MD and neurology       Date: 01/25/2012 18:07  Rate: 66  Rhythm: normal sinus rhythm  QRS Axis: normal  Intervals: normal  ST/T Wave abnormalities: normal and nonspecific T wave changes  Conduction Disutrbances:first-degree A-V block   Narrative Interpretation:   Old EKG Reviewed: unchanged        Carlyle Dolly, PA-C 01/25/12 2003

## 2012-01-25 NOTE — ED Notes (Addendum)
Pt's daughter states that she was shaking her head and foaming at the mouth this am x 10 minutes this am. States that it has happened x 3 this am. Pt alert and oriented x 2 this am. Pt also c/o diarrhea since yesterday. States that the fire department came and checked her CBG and B/P and it was normal.

## 2012-01-25 NOTE — ED Notes (Signed)
Family states pt was sitting at table and began to have some sort of shaky movement. Pt was aware of being shaky. States her PCP recently change some of her meds

## 2012-01-26 ENCOUNTER — Inpatient Hospital Stay (HOSPITAL_COMMUNITY): Payer: Medicare Other

## 2012-01-26 LAB — HEMOGLOBIN A1C
Hgb A1c MFr Bld: 6.8 % — ABNORMAL HIGH (ref <5.7)
Mean Plasma Glucose: 148 mg/dL — ABNORMAL HIGH (ref <117)
Mean Plasma Glucose: 151 mg/dL — ABNORMAL HIGH (ref <117)

## 2012-01-26 LAB — CBC
HCT: 34.7 % — ABNORMAL LOW (ref 36.0–46.0)
Hemoglobin: 11.5 g/dL — ABNORMAL LOW (ref 12.0–15.0)
MCH: 27.1 pg (ref 26.0–34.0)
MCV: 81.8 fL (ref 78.0–100.0)
RBC: 4.24 MIL/uL (ref 3.87–5.11)

## 2012-01-26 LAB — GLUCOSE, CAPILLARY
Glucose-Capillary: 102 mg/dL — ABNORMAL HIGH (ref 70–99)
Glucose-Capillary: 159 mg/dL — ABNORMAL HIGH (ref 70–99)

## 2012-01-26 LAB — LIPID PANEL
Cholesterol: 179 mg/dL (ref 0–200)
Total CHOL/HDL Ratio: 4.4 RATIO
VLDL: 33 mg/dL (ref 0–40)

## 2012-01-26 LAB — CREATININE, SERUM: Creatinine, Ser: 1.54 mg/dL — ABNORMAL HIGH (ref 0.50–1.10)

## 2012-01-26 LAB — TSH: TSH: 4.285 u[IU]/mL (ref 0.350–4.500)

## 2012-01-26 MED ORDER — ENOXAPARIN SODIUM 30 MG/0.3ML ~~LOC~~ SOLN
30.0000 mg | SUBCUTANEOUS | Status: DC
  Administered 2012-01-26 – 2012-01-29 (×4): 30 mg via SUBCUTANEOUS
  Filled 2012-01-26 (×5): qty 0.3

## 2012-01-26 MED ORDER — LEVETIRACETAM 500 MG PO TABS
500.0000 mg | ORAL_TABLET | Freq: Two times a day (BID) | ORAL | Status: DC
  Filled 2012-01-26 (×2): qty 1

## 2012-01-26 MED ORDER — LORAZEPAM 2 MG/ML IJ SOLN
1.0000 mg | INTRAMUSCULAR | Status: DC | PRN
  Administered 2012-01-26: 1 mg via INTRAVENOUS
  Filled 2012-01-26: qty 1

## 2012-01-26 MED ORDER — LEVETIRACETAM 500 MG PO TABS
1000.0000 mg | ORAL_TABLET | Freq: Two times a day (BID) | ORAL | Status: DC
  Administered 2012-01-26 – 2012-01-29 (×7): 1000 mg via ORAL
  Filled 2012-01-26 (×9): qty 2

## 2012-01-26 NOTE — ED Provider Notes (Signed)
Medical screening examination/treatment/procedure(s) were conducted as a shared visit with non-physician practitioner(s) and myself.  I personally evaluated the patient during the encounter  Facial shaking episodes x3 today.  Seen at [facility]. No focal neuro deficits.  New infarct on MRI.  [name], MD 01/26/12 [phone]

## 2012-01-26 NOTE — Progress Notes (Signed)
[name] is an 71 y.o. female black presenting in the ER with a seizure. Her daughter states that the morning of admission the patient was sitting in a chair and suddenly started shaking. Her head was turned to the right and her eyes were turned to the right. This lasted for less than a minute she believes. No other parts of her body were shaking. The patient did have urine incontinence afterwards. She seemed a little bit confused afterwards but did not complain of a headache nor did she have her tongue bite. The patient has never had this happen before. She did not feel it coming on either.She does not having numbness or tingling anywhere.  The patient does not have any weakness in her face arms or legs.  The patient does not have dizziness/vertigo/lightheadedness. Patient also does not have any difficulty with her speech.  The patient was transfer from [facility] earlier today. She was taken by her family because of altered mental status and an episode of shaking. The patient was discharged after a normal CT scan and normal labs.  The patient takes an aspirin occasionally. The last time she took an aspirin was about 4 days ago.  We were ask to see the patient for CVA.  tPA Given: tpa not given      Subjective: Patient had seizure before I went into the room is currently post-ictal, only answering yes and no question. Was not incontinent. Objective: Filed Vitals:   01/26/12 0220 01/26/12 0420 01/26/12 0620 01/26/12 1036  BP: 135/68 127/67 101/64 102/64  Pulse: 68 62 73 51  Temp: 97.6 F (36.4 C) 97.8 F (36.6 C) 98 F (36.7 C) 98.2 F (36.8 C)  TempSrc:    Oral  Resp: 18 18 16 18   Height:      Weight:      SpO2: 98% 98% 99% 95%   Weight change:  No intake or output data in the 24 hours ending 01/26/12 1126  General: Alert, awake, oriented x3, in no acute distress.  HEENT: No bruits, no goiter.  Heart: Regular rate and rhythm, without murmurs, rubs, gallops.  Lungs:  Crackles left side, bilateral air movement.  Abdomen: Soft, nontender, nondistended, positive bowel sounds.  Neuro: Grossly intact, nonfocal.   Lab Results:  [facility] 01/25/12 2333 01/25/12 1116  NA -- 142  K -- 4.3  CL -- 116*  CO2 -- 17*  GLUCOSE -- 135*  BUN -- 73*  CREATININE 1.54* 1.77*  CALCIUM -- 8.6  MG -- --  PHOS -- --    Basename 01/25/12 1116  AST 22  ALT 16  ALKPHOS 54  BILITOT 0.2*  PROT 9.8*  ALBUMIN 3.3*   No results found for this basename: LIPASE:2,AMYLASE:2 in the last 72 hours  Basename 01/25/12 2333 01/25/12 1116  WBC 3.4* 4.0  NEUTROABS -- 2.8  HGB 11.5* 11.8*  HCT 34.7* 37.2  MCV 81.8 83.2  PLT 180 169   No results found for this basename: CKTOTAL:3,CKMB:3,CKMBINDEX:3,TROPONINI:3 in the last 72 hours No components found with this basename: POCBNP:3 No results found for this basename: DDIMER:2 in the last 72 hours No results found for this basename: HGBA1C:2 in the last 72 hours  Basename 01/26/12 0540  CHOL 179  HDL 41  LDLCALC 105*  TRIG 163*  CHOLHDL 4.4  LDLDIRECT --    Basename 01/26/12 0540  TSH 4.285  T4TOTAL --  T3FREE --  THYROIDAB --   No results found for this basename: VITAMINB12:2,FOLATE:2,FERRITIN:2,TIBC:2,IRON:2,RETICCTPCT:2  in the last 72 hours  Micro Results: No results found for this or any previous visit (from the past 240 hour(s)).  Studies/Results: Dg Chest 2 View  01/25/2012  *RADIOLOGY REPORT*  Clinical Data: Stroke, history diabetes, hypertension, smoking, CABG  CHEST - 2 VIEW  Comparison: 10/01/2011  Findings: Enlargement of cardiac silhouette post CABG. Tortuous aorta. Pulmonary vascularity normal. Minimally prominent right paratracheal soft tissues stable. Lungs clear. No pleural effusion or pneumothorax. No acute osseous findings.  IMPRESSION: Enlargement of cardiac silhouette post CABG. No acute abnormalities.  Original Report Authenticated By: Lollie Marrow, M.D.   Ct Head Wo Contrast  01/25/2012   *RADIOLOGY REPORT*  Clinical Data: Altered mental status.  CT HEAD WITHOUT CONTRAST  Technique:  Contiguous axial images were obtained from the base of the skull through the vertex without contrast.  Comparison: 05/16/2007  Findings: There is moderate central and cortical atrophy. Periventricular white matter changes are consistent with small vessel disease.  Multiple old lacunar infarctions are identified within the basal ganglia and thalami bilaterally.  Chronic infarct is identified within the right occipital lobe, new since the prior study.  Bone windows show no acute abnormalities.  IMPRESSION:  1.  Significant atrophy and small vessel disease. 2.  Old infarcts involving the basal ganglia bilaterally. 3.  Interval but chronic infarct involving the right occipital lobe. 4. No evidence for acute intracranial abnormality.  Original Report Authenticated By: Patterson Hammersmith, M.D.   Mr Brain Wo Contrast  01/25/2012  *RADIOLOGY REPORT*  Clinical Data: Weakness and shaking.  Confusion.  Altered mental status.  MRI HEAD WITHOUT CONTRAST  Technique:  Multiplanar, multiecho pulse sequences of the brain and surrounding structures were obtained according to standard protocol without intravenous contrast.  Comparison: CT 01/25/2012, MRI 10/14/2011  Findings: Small acute infarct left frontal cortex over the convexity.  No other acute infarct.  Moderate atrophy.  Extensive chronic ischemic change throughout the basal ganglia bilaterally.  Interval development of infarct in the right inferior occipital lobe since the prior MRI.  This has a chronic appearance.  Small chronic infarct left parietal lobe is unchanged.  Negative for hemorrhage or mass lesion.  Paranasal sinuses are clear.  IMPRESSION: Sub centimeter focus of acute infarct in the high left frontal lobe.  Original Report Authenticated By: Camelia Phenes, M.D.    Medications: I have reviewed the patient's current medications.  Assessment and plan: Principal  Problem:  *Stroke/Seizure-like activity: -EEG,  Carotid Doppler,  MRA, 2D Echo (for embolic stroke )pending. Neuro on board.  On ASA, statin PT has seen the pateint recommended Home PT. Question which one came first? Seizure producing stroke. -he is on keppra for seizure, increase keppra. -Smoking Cessation consult and counseling. -HbgA1c pending   Hypertension; Controlled. Hold metoprolol BP borderline low.   Diabetes mellitus  controlled.   ARF (acute renal failure) Improving.    LOS: 1 day   Marinda Elk M.D. Pager: [phone] [facility] 01/26/2012, 11:26 AM
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
## 2012-01-26 NOTE — Evaluation (Signed)
Physical Therapy Evaluation Patient Details Name: [name] MRN: [id number] DOB: 1941-02-09 Today's Date: 01/26/2012  Problem List:  Patient Active Problem List  Diagnoses  . Stroke  . Hypertension  . Diabetes mellitus  . Seizure-like activity  . ARF (acute renal failure)    Past Medical History:  Past Medical History  Diagnosis Date  . Diabetes mellitus   . Hypertension   . Stroke    Past Surgical History:  Past Surgical History  Procedure Date  . Coronary artery bypass graft     PT Assessment/Plan/Recommendation PT Assessment Clinical Impression Statement: Patient will benefit from PTin the acute care setting to maximize functional independence and ensure she can return to independent transfers and gait with decreased fall risk. PT Recommendation/Assessment: Patient will need skilled PT in the acute care venue PT Problem List: Decreased strength;Decreased activity tolerance;Decreased balance;Decreased mobility;Decreased cognition PT Therapy Diagnosis : Difficulty walking;Generalized weakness;Altered mental status PT Plan PT Frequency: Min 4X/week PT Treatment/Interventions: DME instruction;Gait training;Stair training;Functional mobility training;Therapeutic activities;Therapeutic exercise;Balance training;Cognitive remediation;Neuromuscular re-education;Patient/family education PT Recommendation Follow Up Recommendations: Home health PT;Supervision/Assistance - 24 hour Equipment Recommended: None recommended by PT PT Goals  Acute Rehab PT Goals PT Goal Formulation: With patient/family Time For Goal Achievement: 7 days Pt will Ambulate: >150 feet;with modified independence;with least restrictive assistive device PT Goal: Ambulate - Progress: Goal set today Additional Goals Additional Goal #1: Patient will complete timed up and go test in less than 13.5 seconds to indicate decreased risk of fall PT Goal: Additional Goal #1 - Progress: Goal set today  PT  Evaluation Precautions/Restrictions  Precautions Precautions: Fall Prior Functioning  Home Living Lives With: Daughter Receives Help From: Family Type of Home: Apartment Home Layout: One level Alternate Level Stairs-Rails: None Home Access: Stairs to enter Entrance Stairs-Rails: Right;Left;Can reach both Entrance Stairs-Number of Steps: 7-8 Bathroom Shower/Tub: Tub/shower unit (likes to have a bath. Also sponge at times) Home Adaptive Equipment: Walker - rolling Prior Function Level of Independence: Needs assistance with homemaking;Independent with transfers;Independent with gait;Needs assistance with ADLs Able to Take Stairs?: Yes Driving: No Vocation: Retired Producer, television/film/video: Awake/alert Overall Cognitive Status: History of cognitive impairments History of Cognitive Impairment: Decline in baseline functioning Orientation Level: Disoriented to time;Disoriented to situation;Oriented to person;Oriented to place Sensation/Coordination Sensation Light Touch: Not tested Additional Comments: Nothing abnormal reported Extremity Assessment RLE Assessment RLE Assessment: Within Functional Limits LLE Assessment LLE Assessment: Exceptions to WFL LLE AROM (degrees) Overall AROM Left Lower Extremity: Within functional limits for tasks assessed LLE Strength LLE Overall Strength: Deficits LLE Overall Strength Comments: Hip flexion, knee extension 4/5 Mobility (including Balance) Bed Mobility Supine to Sit: 7: Independent Sitting - Scoot to Edge of Bed: 7: Independent Transfers Transfers: Yes Sit to Stand: 7: Independent;With upper extremity assist;Without upper extremity assist Stand to Sit: 7: Independent;Without upper extremity assist;With upper extremity assist Ambulation/Gait Ambulation/Gait: Yes Ambulation/Gait Assistance: 4: Min assist Ambulation/Gait Assistance Details (indicate cue type and reason): Patient felt more comfortable placing hand behind  PT's back. Without this support patient had a tendency to veer to left particularly when turning her head or increased distractions in halls. Ambulation Distance (Feet): 220 Feet Assistive device: None Gait Pattern: Step-through pattern;Decreased stride length (Intermittently decreased step continuity. Decr. arm swing) Stairs: Yes Stairs Assistance: 5: Supervision Stairs Assistance Details (indicate cue type and reason): With rails for support patient with increased safety. Stair Management Technique: Two rails;Step to pattern;Forwards  Posture/Postural Control Posture/Postural Control: Postural limitations Postural Limitations: With dynamic activities patient with  increased postural sway. With tendency for left side flexion of trunk/veer left. Balance Balance Assessed: Yes Static Sitting Balance Static Sitting - Balance Support: Feet supported;No upper extremity supported Static Sitting - Level of Assistance: 7: Independent Static Standing Balance Static Standing - Balance Support: No upper extremity supported Static Standing - Level of Assistance: 7: Independent End of Session PT - End of Session Equipment Utilized During Treatment: Gait belt Activity Tolerance: Patient tolerated treatment well Patient left: in chair;with call bell in reach Nurse Communication: Mobility status for transfers;Mobility status for ambulation General Behavior During Session: [facility] for tasks performed Cognition: Impaired, at baseline  Edwyna Perfect, PT  Pager [phone]  01/26/2012, 10:36 AM

## 2012-01-26 NOTE — Progress Notes (Signed)
History: [name] is an 71 y.o. female black presenting in the ER with a seizure. Her daughter states that this morning  (01/25/12) the patient was sitting in a chair and suddenly started shaking. Her head was turned to the right and her eyes were turned to the right.  This lasted for less than a minute she believes. No other parts of her body were shaking. The patient did have urine incontinence afterwards. She seemed a little bit confused afterwards but did not complain of a headache nor did she have her tongue bite.  The patient has never had this happen before. She did not feel it coming on either. The patient currently feels fine. She does not having numbness or tingling anywhere. She does not have any problems with swallowing or any trouble with vision. The patient does not have any weakness in her face arms or legs. She does not have a headache. The patient does not have dizziness/vertigo/lightheadedness. Patient also does not have any difficulty with her speech.  The patient had gone to [facility] earlier today before the above episode. She was taken by her family because of altered mental status and an episode of shaking. The patient was discharged after a normal CT scan and normal labs.  The patient takes an aspirin occasionally. The last time she took an aspirin was about 4 days ago.   LSN: before 10am 01/25/2012   tPA Given: tpa not given as pt outside time window for treatment and probable seizure with onset of stroke.   Subjective: The patient is alert and cooperative, not oriented to place. The patient denies any headache, or any other discomfort.   Objective: BP 101/64  Pulse 73  Temp(Src) 98 F (36.7 C) (Oral)  Resp 16  Ht 5\' 3"  (1.6 m)  Wt 58.968 kg (130 lb)  BMI 23.03 kg/m2  SpO2 99% Telemetry:  CBGs  Basename 01/26/12 0718 01/25/12 2146 01/25/12 1033  GLUCAP 102* 53* 111*   Diet: CHO mod, thin  Activity: bedrest  DVT Prophylaxis:   lovenox  Medications: Scheduled:   . aspirin  300 mg Rectal Daily   Or  . aspirin  325 mg Oral Daily  . aspirin  325 mg Oral Daily  . dipyridamole-aspirin  1 capsule Oral BID  . docusate sodium  100 mg Oral Daily  . enoxaparin  40 mg Subcutaneous Q24H  . insulin aspart  0-5 Units Subcutaneous QHS  . insulin aspart  0-9 Units Subcutaneous TID WC  . iron polysaccharides  150 mg Oral Daily  . levetiracetam  1,000 mg Intravenous To Major  . levetiracetam  500 mg Intravenous Q12H  . metoprolol succinate  100 mg Oral BID  . nicotine  21 mg Transdermal Daily  . nitrofurantoin  100 mg Oral BID  . simvastatin  10 mg Oral q1800   Mental Status: Alert, oriented, thought content appropriate.  Speech fluent without evidence of aphasia. Able to follow 3 step commands without difficulty. The patient is alert and cooperative.  Neurologic exam reveals full extraocular movements, speech is slightly dysarthric. Visual fields are full.  Motor testing reveals good strength of all four extremities.  The patient has good finger-nose-finger and heel-to-shin bilaterally. Gait was not tested.  Deep tendon reflexes are symmetric, but are depressed. Toes are down going bilaterally.    Lab Results: Basic Metabolic Panel:  Lab 01/25/12 0981 01/25/12 1116  NA -- 142  K -- 4.3  CL -- 116*  CO2 -- 17*  GLUCOSE -- 135*  BUN -- 73*  CREATININE 1.54* 1.77*  CALCIUM -- 8.6  MG -- --  PHOS -- --   Liver Function Tests:  Lab 01/25/12 1116  AST 22  ALT 16  ALKPHOS 54  BILITOT 0.2*  PROT 9.8*  ALBUMIN 3.3*   CBC:  Lab 01/25/12 2333 01/25/12 1116  WBC 3.4* 4.0  NEUTROABS -- 2.8  HGB 11.5* 11.8*  HCT 34.7* 37.2  MCV 81.8 83.2  PLT 180 169   CBG:  Lab 01/26/12 0718 01/25/12 2146 01/25/12 1033  GLUCAP 102* 53* 111*   Fasting Lipid Panel:  Lab 01/26/12 0540  CHOL 179  HDL 41  LDLCALC 105*  TRIG 163*  CHOLHDL 4.4  LDLDIRECT --   Urine Drug Screen: Drugs of Abuse     Component  Value Date/Time   LABOPIA NONE DETECTED 01/25/2012 1154   COCAINSCRNUR NONE DETECTED 01/25/2012 1154   LABBENZ NONE DETECTED 01/25/2012 1154   AMPHETMU NONE DETECTED 01/25/2012 1154   THCU NONE DETECTED 01/25/2012 1154   LABBARB NONE DETECTED 01/25/2012 1154    Alcohol Level:  Lab 01/25/12 1116  ETH <11   Urinalysis:  Lab 01/25/12 1154  COLORURINE YELLOW  LABSPEC 1.010  PHURINE 8.0  GLUCOSEU NEGATIVE  HGBUR TRACE*  BILIRUBINUR NEGATIVE  KETONESUR NEGATIVE  PROTEINUR 100*  UROBILINOGEN 0.2  NITRITE NEGATIVE  LEUKOCYTESUR NEGATIVE     Study Results:  01/25/2012  CHEST - 2 VIEW   Findings: Enlargement of cardiac silhouette post CABG. Tortuous aorta. Pulmonary vascularity normal. Minimally prominent right paratracheal soft tissues stable. Lungs clear. No pleural effusion or pneumothorax. No acute osseous findings.  IMPRESSION: Enlargement of cardiac silhouette post CABG. No acute abnormalities.  Lollie Marrow, M.D.   01/25/2012  CT HEAD WITHOUT CONTRAST   Findings: There is moderate central and cortical atrophy. Periventricular white matter changes are consistent with small vessel disease.  Multiple old lacunar infarctions are identified within the basal ganglia and thalami bilaterally.  Chronic infarct is identified within the right occipital lobe, new since the prior study.  Bone windows show no acute abnormalities.  IMPRESSION:  1.  Significant atrophy and small vessel disease. 2.  Old infarcts involving the basal ganglia bilaterally. 3.  Interval but chronic infarct involving the right occipital lobe. 4. No evidence for acute intracranial abnormality.   Patterson Hammersmith, M.D.   01/25/2012  MRI HEAD WITHOUT CONTRAST   Findings: Small acute infarct left frontal cortex over the convexity.  No other acute infarct.  Moderate atrophy.  Extensive chronic ischemic change throughout the basal ganglia bilaterally.  Interval development of infarct in the right inferior occipital lobe since the prior MRI.   This has a chronic appearance.  Small chronic infarct left parietal lobe is unchanged.  Negative for hemorrhage or mass lesion.  Paranasal sinuses are clear.  IMPRESSION: Sub centimeter focus of acute infarct in the high left frontal lobe.   Camelia Phenes, M.D.   Pending Studies: 2D ECHO Carotid Dopplers EEG  Therapies: pending  Assessment 71 year old black female past medical history diabetes, hypertension, stroke, coronary artery disease status post bypass surgery, and tobacco dependent presenting in the ER with a seizure. Physical exam is localizing. MRI of the brain shows small acute infarct left frontal lobe.  1. acute CVA Left frontal lobe,  2. Seizure- new onset. 3. DM2- A1C pending, CBG's controlled to low. 4. HTN- controlled. 5. Hyperlipidemia- not at goal <80; LDL 105 on Zocor 10 mg. 6. CAD/CABG- asymptomatic. 7.  CKD 8. Anemia, NCNC 9. Ongoing tobacco use  Stroke risk factors: diabetes, hypertension, stroke, coronary artery disease status post bypass surgery, and tobacco  Suspect that the seizures were the primary event, stroke by MRI appears to be punctate in nature. Cannot completely rule out the possibility of an embolic event to the left brain with almost complete resolution. The patient has no prior history of seizures, the patient has been off of aspirin for at least 6 months. I will discontinue the Aggrenox, and keep the patient on aspirin at this time.   Plan: 1. EEG 2. Carotid Doppler 3. 2D Echo 4. Therapy assessments 6. A1C pending. 7. Smoking Cessation consult 8. MRA of the head is pending.  The patient currently is on Keppra, I will switch her to an oral regimen.   LOS: 1 day   Marya Fossa PA-C [facility] [phone] 01/26/2012  8:23 AM  Lesly Dukes

## 2012-01-26 NOTE — Progress Notes (Signed)
Utilization review completed. [name] 01/26/2012 

## 2012-01-26 NOTE — Progress Notes (Addendum)
At 11:45 this morning; talking to patient at the bedside with her sister; her eyes rolled upward and to the right and Her face was twitching for less then a minute; returned to baseline after; then about five minutes later; observed Her eyes rolling upward and to the right and facial twitching. Could not travel to MRA;  Dr. David Stall notifed;  Resting in bed this afternoon After her EEG; another relative at bedside; approimately 1600 she was talking and arousable then her eyes began To roll upward to the right and facial twitching; Dr. David Stall called again; Ativan 1mg  given prn; next keppra with Increased dose due this hs. Patient arousable but lethargic; VSS.

## 2012-01-26 NOTE — Progress Notes (Signed)
OT Cancellation Note  Treatment cancelled today due to medical issues with patient which prohibited therapy.  Pt. Had witnessed seizure earlier, and on re-attempt this pm, RN reports pt. Sleeping very soundly, and requests OT defer eval until 01/27/12  Boykin Reaper [phone] 01/26/2012, 3:09 PM

## 2012-01-27 ENCOUNTER — Inpatient Hospital Stay (HOSPITAL_COMMUNITY): Payer: Medicare Other

## 2012-01-27 LAB — GLUCOSE, CAPILLARY: Glucose-Capillary: 100 mg/dL — ABNORMAL HIGH (ref 70–99)

## 2012-01-27 MED ORDER — STARCH (THICKENING) PO POWD
ORAL | Status: DC | PRN
  Filled 2012-01-27: qty 227

## 2012-01-27 NOTE — Evaluation (Addendum)
Clinical/Bedside Swallow Evaluation Patient Details  Name: [name] MRN: [id number] DOB: 03-Nov-1941 Today's Date: 01/27/2012  Past Medical History:  Past Medical History  Diagnosis Date  . Diabetes mellitus   . Hypertension   . Stroke    Past Surgical History:  Past Surgical History  Procedure Date  . Coronary artery bypass graft    HPI:  71 yr old admitted with seizure like activity.  MRI revealed a left frontal infarct.  Swallow assessment orderd after pt. with increased drooling and difficulty taking pills with RN.    Assessment/Recommendations/Treatment Plan Suspected Esophageal Findings Suspected Esophageal Findings: Belching  SLP Assessment Clinical Impression Statement: Swallow assessed during lunch meal. Pt. exhibited mild-moderate oral dysphagia characterized by labial weakness and decreased sensation resulting in anterior leakage and pocketing on right.  Immediate and delayed coughing observed with thin liquids caused by either poor oral cohesion, decreased airway protection and possible pharygneal residue or a combination of all three.  Pt. consumed nectar thick tea without indications of penetratin or aspiration.  Recommend modifying diet to Dys 3 and nectar thick liquids with an MBS tomorrow to fully assess swallow function.  Risk for Aspiration: Moderate Other Related Risk Factors: Cognitive impairment  Swallow Evaluation Recommendations Recommended Consults: MBS Solid Consistency: Dysphagia 3 (Mechanical soft) Liquid Consistency: Nectar Liquid Administration via: Spoon;No straw Medication Administration: Crushed with puree Supervision: Full supervision/cueing for compensatory strategies Compensations: Slow rate;Small sips/bites;Check for pocketing;Check for anterior loss Postural Changes and/or Swallow Maneuvers: Seated upright 90 degrees Oral Care Recommendations: Oral care BID Follow up Recommendations:  (TBD)  Treatment Plan Treatment Plan  Recommendations:  (Will develop tx plan after MBS) Speech Therapy Frequency: min 1 x/week     Individuals Consulted Consulted and Agree with Results and Recommendations: Patient  Swallow Study Prior Functional Status     General  Oral Motor/Sensory Function  Overall Oral Motor/Sensory Function: Impaired Labial ROM:  (decreased ROM right due to labial scab) Labial Symmetry: Within Functional Limits Labial Strength: Reduced Labial Sensation: Reduced Lingual ROM: Within Functional Limits Lingual Symmetry: Within Functional Limits Facial Sensation: Reduced Velum:  (decreaswed ROM) Mandible: Within Functional Limits  Consistency Results  Ice Chips Ice chips: Not tested  Thin Liquid Thin Liquid: Impaired Presentation: Cup Oral Phase Impairments: Reduced labial seal;Impaired mastication;Reduced lingual movement/coordination Oral Phase Functional Implications: Right anterior spillage;Right lateral sulci pocketing (anterior leakage) Pharyngeal  Phase Impairments: Cough - Immediate;Cough - Delayed  Nectar Thick Liquid Nectar Thick Liquid: Within functional limits Presentation: Cup  Honey Thick Liquid Honey Thick Liquid: Not tested  Puree Puree: Within functional limits Presentation: Spoon  Solid Solid: Impaired Oral Phase Impairments: Reduced lingual movement/coordination Oral Phase Functional Implications:  (decreased transit)   Royce Macadamia M.Ed [employer] [phone]  01/27/2012

## 2012-01-27 NOTE — Procedures (Signed)
EEG:  [id number].  HISTORY:  This is a 71 years old woman referred for rule out seizures.  MEDICATIONS:  Keppra.  CONDITION OF RECORDING:  This 16-lead EEG was recorded with the patient in awake and drowsy states.  Background rhythms; background patterns in wakefulness were well organized with a well-sustained posterior dominant rhythm of 8.5 Hz, symmetrical and reactive to eye opening and closing. Drowsiness was associated with mild attenuation of voltage and slowing Of frequencies.  Abnormal potentials: no epileptiform activity or focal slowing was noted.  ACTIVATION PROCEDURES:  Hyperventilation was not performed.  Photic stimulation was not performed.  EKG:  Single-channel of EKG monitoring detected an irregular rhythm.  IMPRESSION:  This was a normal awake and drowsy EEG.  A normal EEG does not rule out the clinical diagnosis of epilepsy.  If clinically warranted, a repeat extended EEG or ambulatory recording may be obtained for prolonged recording times and sleep capture, which may increase the diagnostic yield.  Clinical correlation is suggested.          ______________________________ Carmell Austria, MD    [id number] D:  01/26/2012 14:23:40  T:  01/26/2012 21:07:05  Job #:  [id number]

## 2012-01-27 NOTE — Progress Notes (Signed)
Subjective: No complains. Objective: Filed Vitals:   01/26/12 1624 01/26/12 2200 01/27/12 0600 01/27/12 0900  BP: 111/69 116/65 95/62 119/74  Pulse: 68 72 41 73  Temp: 97.9 F (36.6 C) 97.5 F (36.4 C) 97.4 F (36.3 C) 97.8 F (36.6 C)  TempSrc: Oral   Oral  Resp: 18 16 16 18  Height:      Weight:      SpO2: 99% 98% 100% 97%   Weight change:  No intake or output data in the 24 hours ending 01/27/12 1039  General: Alert, awake, oriented x3, in no acute distress.  HEENT: No bruits, no goiter.  Heart: Regular rate and rhythm, without murmurs, rubs, gallops.  Lungs: Crackles left side, bilateral air movement.  Abdomen: Soft, nontender, nondistended, positive bowel sounds.  Neuro: Grossly intact, nonfocal.   Lab Results:  Basename 01/25/12 2333 01/25/12 1116  NA -- 142  K -- 4.3  CL -- 116*  CO2 -- 17*  GLUCOSE -- 135*  BUN -- 73*  CREATININE 1.54* 1.77*  CALCIUM -- 8.6  MG -- --  PHOS -- --    Basename 01/25/12 1116  AST 22  ALT 16  ALKPHOS 54  BILITOT 0.2*  PROT 9.8*  ALBUMIN 3.3*   No results found for this basename: LIPASE:2,AMYLASE:2 in the last 72 hours  Basename 01/25/12 2333 01/25/12 1116  WBC 3.4* 4.0  NEUTROABS -- 2.8  HGB 11.5* 11.8*  HCT 34.7* 37.2  MCV 81.8 83.2  PLT 180 169   No results found for this basename: CKTOTAL:3,CKMB:3,CKMBINDEX:3,TROPONINI:3 in the last 72 hours No components found with this basename: POCBNP:3 No results found for this basename: DDIMER:2 in the last 72 hours  Basename 01/26/12 1351 01/26/12 0540  HGBA1C 6.8* 6.9*    Basename 01/26/12 0540  CHOL 179  HDL 41  LDLCALC 105*  TRIG 163*  CHOLHDL 4.4  LDLDIRECT --    Basename 01/26/12 0540  TSH 4.285  T4TOTAL --  T3FREE --  THYROIDAB --   No results found for this basename: VITAMINB12:2,FOLATE:2,FERRITIN:2,TIBC:2,IRON:2,RETICCTPCT:2 in the last 72 hours  Micro Results: No results found for this or any previous visit (from the past 240  hour(s)).  Studies/Results: Dg Chest 2 View  01/25/2012  *RADIOLOGY REPORT*  Clinical Data: Stroke, history diabetes, hypertension, smoking, CABG  CHEST - 2 VIEW  Comparison: 10/01/2011  Findings: Enlargement of cardiac silhouette post CABG. Tortuous aorta. Pulmonary vascularity normal. Minimally prominent right paratracheal soft tissues stable. Lungs clear. No pleural effusion or pneumothorax. No acute osseous findings.  IMPRESSION: Enlargement of cardiac silhouette post CABG. No acute abnormalities.  Original Report Authenticated By: MARK A. BOLES, M.D.   Ct Head Wo Contrast  01/25/2012  *RADIOLOGY REPORT*  Clinical Data: Altered mental status.  CT HEAD WITHOUT CONTRAST  Technique:  Contiguous axial images were obtained from the base of the skull through the vertex without contrast.  Comparison: 05/16/2007  Findings: There is moderate central and cortical atrophy. Periventricular white matter changes are consistent with small vessel disease.  Multiple old lacunar infarctions are identified within the basal ganglia and thalami bilaterally.  Chronic infarct is identified within the right occipital lobe, new since the prior study.  Bone windows show no acute abnormalities.  IMPRESSION:  1.  Significant atrophy and small vessel disease. 2.  Old infarcts involving the basal ganglia bilaterally. 3.  Interval but chronic infarct involving the right occipital lobe. 4. No evidence for acute intracranial abnormality.  Original Report Authenticated By: ELIZABETH D. BROWN, M.D.     Mr Brain Wo Contrast  01/25/2012  *RADIOLOGY REPORT*  Clinical Data: Weakness and shaking.  Confusion.  Altered mental status.  MRI HEAD WITHOUT CONTRAST  Technique:  Multiplanar, multiecho pulse sequences of the brain and surrounding structures were obtained according to standard protocol without intravenous contrast.  Comparison: CT 01/25/2012, MRI 10/14/2011  Findings: Small acute infarct left frontal cortex over the convexity.  No other  acute infarct.  Moderate atrophy.  Extensive chronic ischemic change throughout the basal ganglia bilaterally.  Interval development of infarct in the right inferior occipital lobe since the prior MRI.  This has a chronic appearance.  Small chronic infarct left parietal lobe is unchanged.  Negative for hemorrhage or mass lesion.  Paranasal sinuses are clear.  IMPRESSION: Sub centimeter focus of acute infarct in the high left frontal lobe.  Original Report Authenticated By: DAVID C. CLARK, M.D.   Mr Mra Head/brain Wo Cm  01/27/2012  *RADIOLOGY REPORT*  Clinical Data: Stroke. Mental status changes.  MRA HEAD WITHOUT CONTRAST  Technique: Angiographic images of the Circle of Willis were obtained using MRA technique without intravenous contrast.  Comparison: None. MRI of the brain of 01/25/2012.  Findings:  The  right internal carotid artery in its petrous, cavernous and supraclinoid segments is widely patent.  T he right middle cerebral artery  in the mid M1 segment demonstrates a focal signal drop off with reconstitution of signal distal to this.  The trifurcation branches widely patent.  The right anterior cerebral artery to the A2 region demonstrates adequate caliber and flow signal.  The left internal carotid its petrous cavernous and supraclinoid segments are widely patent  Arising in the superior hypophyseal region of the left internal carotid artery is a saccular outpouching projecting medially.  This measures  approximately 5 mm x 3.8 mm and  is suspicious of an intracranial aneurysm.  The left middle cerebral artery and left anterior cerebral artery demonstrate adequate caliber and flow signal into the tertiary branches.  Anterior communicating artery is patent.  The right vertebrobasilar junction is patent with some tapered narrowing of the right vertebrobasilar junction at the confluence with the contralateral vertebrobasilar junction to form the basilar artery.  The left vertebrobasilar junction  demonstrates a somewhat significant narrowing distal to the left posterior inferior cerebellar artery.  There is a mild to moderate decreased caliber of the mid basilar artery.  The basilar artery distal to this is of adequate caliber and signal.  The right posterior cerebral artery has a signal drop off in its distal P1 segment with flow signal demonstrated distal to this.  The left posterior cerebral artery also demonstrates less prominent decreased signal proximally.  The superior cerebellar arteries are patent.  A prominent right anterior-inferior cerebellar is seen.  Impression 1. 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 2.  Focal areas of significant signal drop-off involving the right middle cerebral artery, the posterior cerebral arteries and vertebrobasilar junctions.  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  Original Report Authenticated By: SANJEEV K. DEVESHWAR, M.D.    Medications: I have reviewed the patient's current medications.  Assessment and plan: Principal Problem:  *Acute CVA Left frontal lobe, -Neuro recommended TEE, NPO after midnight. Check a swallowing evaluation. -HgbA1c: 6.8 -fasting lipid panel: LDL 15 triglycerides 163 currently on a statin. -MRI, MRA : With result as above. Doppler studies pending. -PT consult, OT consult, Speech Pending. -Prophylactic therapy-Antiplatelet med: Aspirin - dose 325 mg. -Cardiac Monitoring :   First degree AV block with multiple premature, Mrs. Phillips was within normal limits. -Keep MAP 70 -tobacco counseling cessation.  - Hypertension: Controlled, has had one episode of blood pressure below 100, we'll continue to hold blood pressure medications.  - Diabetes mellitus: Controlled. Hemoglobin A1c is 6.8.   - ARF (acute renal failure):  Baseline creatinine 1.2. Currently currently trending down with IV fluids 1.5 check a basic metabolic  panel in the morning.    LOS: 2 days   [name], Michiel Sivley M.D. Pager: [phone] [facility] 01/27/2012, 10:39 AM   

## 2012-01-27 NOTE — Progress Notes (Signed)
Pt. noted to have difficulty this am with swallowing meds; coughing after swallowing one pill with water drooling out of side of mouth. Pt. having to take a while to swallow each pill. Dr. David Stall notified. Will continue to monitor.

## 2012-01-27 NOTE — Progress Notes (Signed)
VASCULAR LAB PRELIMINARY  PRELIMINARY  PRELIMINARY  PRELIMINARY  Carotid duplex completed.    Preliminary report:  Bilateral:  No evidence of hemodynamically significant internal carotid artery stenosis.   Vertebral artery flow is antegrade.     Arthur Speagle D, RVS 01/27/2012, 10:48 AM

## 2012-01-27 NOTE — Plan of Care (Signed)
Problem: Phase II Progression Outcomes Goal: Tolerating diet Swallow assessment completed.  Modified diet to Dys 3, nectar liquids and MBS tomorrow.  See report for details.  Breck Coons McLean.Ed [employer] [phone]  01/27/2012

## 2012-01-27 NOTE — Progress Notes (Signed)
History: [name] is an 71 y.o. female black presenting in the ER with a seizure. Her daughter states that this morning  (01/25/12) the patient was sitting in a chair and suddenly started shaking. Her head was turned to the right and her eyes were turned to the right.  This lasted for less than a minute she believes. No other parts of her body were shaking. The patient did have urine incontinence afterwards. She seemed a little bit confused afterwards but did not complain of a headache nor did she have her tongue bite.  The patient has never had this happen before. She did not feel it coming on either. The patient currently feels fine. She does not having numbness or tingling anywhere. She does not have any problems with swallowing or any trouble with vision. The patient does not have any weakness in her face arms or legs. She does not have a headache. The patient does not have dizziness/vertigo/lightheadedness. Patient also does not have any difficulty with her speech.  The patient had gone to [facility] earlier today before the above episode. She was taken by her family because of altered mental status and an episode of shaking. The patient was discharged after a normal CT scan and normal labs.  The patient takes an aspirin occasionally. The last time she took an aspirin was about 4 days ago.   LSN: before 10am 01/25/2012   tPA Given: tpa not given as pt outside time window for treatment and probable seizure with onset of stroke.   Subjective: The patient is alert and cooperative, not oriented to place. The patient denies any headache, or any other discomfort.  Scab on lip healing.  Objective: BP 119/74  Pulse 73  Temp(Src) 97.8 F (36.6 C) (Oral)  Resp 18  Ht 5\' 3"  (1.6 m)  Wt 58.968 kg (130 lb)  BMI 23.03 kg/m2  SpO2 97% Telemetry:  CBGs  Basename 01/27/12 0634 01/26/12 2222 01/26/12 1648 01/26/12 1132 01/26/12 0718 01/25/12 2146 01/25/12 1033  GLUCAP 136* 107* 115* 159*  102* 53* 111*   Diet: CHO mod, thin  Activity: up with assistance  DVT Prophylaxis:  lovenox  Medications: Scheduled:    . aspirin  325 mg Oral Daily  . docusate sodium  100 mg Oral Daily  . enoxaparin (LOVENOX) injection  30 mg Subcutaneous Q24H  . insulin aspart  0-5 Units Subcutaneous QHS  . insulin aspart  0-9 Units Subcutaneous TID WC  . iron polysaccharides  150 mg Oral Daily  . levETIRAcetam  1,000 mg Oral BID  . nicotine  21 mg Transdermal Daily  . nitrofurantoin  100 mg Oral BID  . simvastatin  10 mg Oral q1800  . DISCONTD: aspirin  300 mg Rectal Daily  . DISCONTD: aspirin  325 mg Oral Daily  . DISCONTD: enoxaparin  40 mg Subcutaneous Q24H  . DISCONTD: levETIRAcetam  500 mg Oral BID  . DISCONTD: metoprolol succinate  100 mg Oral BID   Mental Status: Alert, oriented, thought content appropriate.  Speech fluent without evidence of aphasia. Able to follow 3 step commands without difficulty. The patient is alert and cooperative.  Neurologic exam reveals full extraocular movements, speech is slightly dysarthric. Visual fields are full.  Motor testing reveals good strength of all four extremities.  The patient has good finger-nose-finger and heel-to-shin bilaterally. Gait was not tested.  Deep tendon reflexes are symmetric, but are depressed. Toes are down going bilaterally.  Lab Results: Basic Metabolic Panel:  Lab  01/25/12 2333 01/25/12 1116  NA -- 142  K -- 4.3  CL -- 116*  CO2 -- 17*  GLUCOSE -- 135*  BUN -- 73*  CREATININE 1.54* 1.77*  CALCIUM -- 8.6  MG -- --  PHOS -- --   Liver Function Tests:  Lab 01/25/12 1116  AST 22  ALT 16  ALKPHOS 54  BILITOT 0.2*  PROT 9.8*  ALBUMIN 3.3*   CBC:  Lab 01/25/12 2333 01/25/12 1116  WBC 3.4* 4.0  NEUTROABS -- 2.8  HGB 11.5* 11.8*  HCT 34.7* 37.2  MCV 81.8 83.2  PLT 180 169    CBG:  Lab 01/27/12 0634 01/26/12 2222 01/26/12 1648 01/26/12 1132 01/26/12 0718 01/25/12 2146  GLUCAP 136* 107* 115*  159* 102* 53*   Hemoglobin A1C:  Lab 01/26/12 1351  HGBA1C 6.8*   Fasting Lipid Panel:  Lab 01/26/12 0540  CHOL 179  HDL 41  LDLCALC 105*  TRIG 163*  CHOLHDL 4.4  LDLDIRECT --   Thyroid Function Tests:  Lab 01/26/12 0540  TSH 4.285  T4TOTAL --  FREET4 --  T3FREE --  THYROIDAB --   Urine Drug Screen: Drugs of Abuse     Component Value Date/Time   LABOPIA NONE DETECTED 01/25/2012 1154   COCAINSCRNUR NONE DETECTED 01/25/2012 1154   LABBENZ NONE DETECTED 01/25/2012 1154   AMPHETMU NONE DETECTED 01/25/2012 1154   THCU NONE DETECTED 01/25/2012 1154   LABBARB NONE DETECTED 01/25/2012 1154    Alcohol Level:  Lab 01/25/12 1116  ETH <11   Urinalysis:  Lab 01/25/12 1154  COLORURINE YELLOW  LABSPEC 1.010  PHURINE 8.0  GLUCOSEU NEGATIVE  HGBUR TRACE*  BILIRUBINUR NEGATIVE  KETONESUR NEGATIVE  PROTEINUR 100*  UROBILINOGEN 0.2  NITRITE NEGATIVE  LEUKOCYTESUR NEGATIVE    Study Results:  01/25/2012  CHEST - 2 VIEW   Findings: Enlargement of cardiac silhouette post CABG. Tortuous aorta. Pulmonary vascularity normal. Minimally prominent right paratracheal soft tissues stable. Lungs clear. No pleural effusion or pneumothorax. No acute osseous findings.  IMPRESSION: Enlargement of cardiac silhouette post CABG. No acute abnormalities.  Lollie Marrow, M.D.   01/25/2012  CT HEAD WITHOUT CONTRAST   Findings: There is moderate central and cortical atrophy. Periventricular white matter changes are consistent with small vessel disease.  Multiple old lacunar infarctions are identified within the basal ganglia and thalami bilaterally.  Chronic infarct is identified within the right occipital lobe, new since the prior study.  Bone windows show no acute abnormalities.  IMPRESSION:  1.  Significant atrophy and small vessel disease. 2.  Old infarcts involving the basal ganglia bilaterally. 3.  Interval but chronic infarct involving the right occipital lobe. 4. No evidence for acute intracranial  abnormality.   Patterson Hammersmith, M.D.   01/25/2012  MRI HEAD WITHOUT CONTRAST   Findings: Small acute infarct left frontal cortex over the convexity.  No other acute infarct.  Moderate atrophy.  Extensive chronic ischemic change throughout the basal ganglia bilaterally.  Interval development of infarct in the right inferior occipital lobe since the prior MRI.  This has a chronic appearance.  Small chronic infarct left parietal lobe is unchanged.  Negative for hemorrhage or mass lesion.  Paranasal sinuses are clear.  IMPRESSION: Sub centimeter focus of acute infarct in the high left frontal lobe.   Camelia Phenes, M.D.   01/27/12 Carotid Dopplers: Bilateral: No evidence of hemodynamically significant internal carotid artery stenosis. Vertebral artery flow is antegrade.   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 Physical exam is localizing. MRI of the brain shows small acute infarct left frontal lobe.  1. acute CVA Left frontal lobe, Suspect that the seizures were the primary event, stroke by MRI appears to be punctate in nature. Cannot completely rule out the possibility of an embolic event to the left brain with almost complete resolution. The patient has no prior history of seizures, the patient has been off of aspirin for at least 6 months. I will discontinue the Aggrenox, and keep the patient on aspirin at this time 2. Seizure- new onset. 3. DM2- A1C 6.8; not at goal <6.5 4. HTN- controlled. 5. Hyperlipidemia- not at goal <80; LDL 105 on Zocor 10 mg. 6. CAD/CABG- asymptomatic. 7. CKD 8. Anemia, NCNC 9. Ongoing tobacco use  Stroke risk factors: diabetes, hypertension, stroke, coronary artery disease status post  bypass surgery, and tobacco  Plan: 1. EEG 2. 2D Echo 3. Therapy assessments 4. Aggressive risk factor modification 5. Smoking Cessation consult 6. Continue Keppra 1000 mg BID 7. ASA 325mg  for secondary stroke prevention.  Patient has been seen and examined by Dr. Pearlean Brownie who recommends above.  LOS: 2 days   Marya Fossa PA-C [facility] [phone] 01/27/2012  11:11 AM  Hayden Kihara C

## 2012-01-27 NOTE — Progress Notes (Signed)
PT Cancellation Note  Treatment cancelled today due to patient receiving procedure or test. I have checked for patient twice, but she was out of room on both occasions.  Edwyna Perfect, PT  Pager [phone]  01/27/2012, 11:12 AM

## 2012-01-28 ENCOUNTER — Encounter (HOSPITAL_COMMUNITY)
Admission: EM | Disposition: A | Discharge status: Skilled Nursing Facility | Payer: Self-pay | Source: Home / Self Care | Attending: Internal Medicine

## 2012-01-28 ENCOUNTER — Inpatient Hospital Stay (HOSPITAL_COMMUNITY): Payer: Medicare Other

## 2012-01-28 ENCOUNTER — Encounter (HOSPITAL_COMMUNITY): Payer: Self-pay | Admitting: *Deleted

## 2012-01-28 DIAGNOSIS — I6789 Other cerebrovascular disease: Secondary | ICD-10-CM

## 2012-01-28 HISTORY — PX: TEE WITHOUT CARDIOVERSION: SHX5443

## 2012-01-28 LAB — BASIC METABOLIC PANEL
GFR calc Af Amer: 36 mL/min — ABNORMAL LOW (ref >90)
GFR calc non Af Amer: 31 mL/min — ABNORMAL LOW (ref >90)
Potassium: 4.2 mEq/L (ref 3.5–5.1)
Sodium: 141 mEq/L (ref 135–145)

## 2012-01-28 LAB — GLUCOSE, CAPILLARY
Glucose-Capillary: 114 mg/dL — ABNORMAL HIGH (ref 70–99)
Glucose-Capillary: 169 mg/dL — ABNORMAL HIGH (ref 70–99)

## 2012-01-28 SURGERY — ECHOCARDIOGRAM, TRANSESOPHAGEAL
Anesthesia: Moderate Sedation

## 2012-01-28 MED ORDER — SODIUM CHLORIDE 0.9 % IV SOLN
INTRAVENOUS | Status: DC
  Administered 2012-01-28: 500 mL via INTRAVENOUS

## 2012-01-28 MED ORDER — MIDAZOLAM HCL 10 MG/2ML IJ SOLN
INTRAMUSCULAR | Status: AC
  Filled 2012-01-28: qty 2

## 2012-01-28 MED ORDER — BENZOCAINE 20 % MT SOLN
1.0000 "application " | OROMUCOSAL | Status: DC | PRN
  Filled 2012-01-28: qty 57

## 2012-01-28 MED ORDER — FENTANYL CITRATE 0.05 MG/ML IJ SOLN
INTRAMUSCULAR | Status: DC | PRN
  Administered 2012-01-28 (×2): 25 ug via INTRAVENOUS

## 2012-01-28 MED ORDER — FENTANYL CITRATE 0.05 MG/ML IJ SOLN: 250.0000 ug | Freq: Once | INTRAMUSCULAR | Status: DC

## 2012-01-28 MED ORDER — BUTAMBEN-TETRACAINE-BENZOCAINE 2-2-14 % EX AERO
INHALATION_SPRAY | CUTANEOUS | Status: DC | PRN
  Administered 2012-01-28: 2 via TOPICAL

## 2012-01-28 MED ORDER — FENTANYL CITRATE 0.05 MG/ML IJ SOLN
INTRAMUSCULAR | Status: AC
  Filled 2012-01-28: qty 2

## 2012-01-28 MED ORDER — MIDAZOLAM HCL 10 MG/2ML IJ SOLN
INTRAMUSCULAR | Status: DC | PRN
  Administered 2012-01-28 (×2): 2 mg via INTRAVENOUS

## 2012-01-28 MED ORDER — SODIUM CHLORIDE 0.9 % IJ SOLN: 3.0000 mL | INTRAMUSCULAR | Status: DC | PRN

## 2012-01-28 MED ORDER — WHITE PETROLATUM GEL
Status: AC
  Administered 2012-01-28: 15:00:00
  Filled 2012-01-28: qty 5

## 2012-01-28 MED ORDER — SODIUM CHLORIDE 0.45 % IV SOLN: INTRAVENOUS | Status: DC

## 2012-01-28 MED ORDER — MIDAZOLAM HCL 10 MG/2ML IJ SOLN: 10.0000 mg | Freq: Once | INTRAMUSCULAR | Status: DC

## 2012-01-28 MED ORDER — SODIUM CHLORIDE 0.9 % IJ SOLN: 3.0000 mL | Freq: Two times a day (BID) | INTRAMUSCULAR | Status: DC

## 2012-01-28 NOTE — Progress Notes (Signed)
Patient ID: [name], female   DOB: 1941/01/20, 71 y.o.   MRN: [id number] Subjective: No events overnight. Patient denies chest pain, shortness of breath, abdominal pain.   Objective:  Vital signs in last 24 hours:  Filed Vitals:   01/28/12 1252 01/28/12 1351 01/28/12 1758 01/28/12 2153  BP: 111/55 112/66 128/73 144/78  Pulse: 65 75 86 89  Temp: 97.6 F (36.4 C) 97.3 F (36.3 C) 98 F (36.7 C) 97 F (36.1 C)  TempSrc: Oral Oral Oral Oral  Resp: 18 20 20 20   Height:      Weight:      SpO2: 98% 97% 98% 99%    Intake/Output from previous day:   Intake/Output Summary (Last 24 hours) at 01/28/12 2223 Last data filed at 01/28/12 1756  Gross per 24 hour  Intake    701 ml  Output      0 ml  Net    701 ml    Physical Exam: General: Alert, awake, oriented x3, in no acute distress. HEENT: No bruits, no goiter. Moist mucous membranes, no scleral icterus, no conjunctival pallor. Heart: Regular rate and rhythm, S1/S2 +, no murmurs, rubs, gallops. Lungs: Clear to auscultation bilaterally. No wheezing, no rhonchi, no rales.  Abdomen: Soft, nontender, nondistended, positive bowel sounds. Extremities: No clubbing or cyanosis, no pitting edema,  positive pedal pulses. Neuro: Grossly nonfocal.  Lab Results:  Basic Metabolic Panel:    Component Value Date/Time   NA 141 01/28/2012 0625   K 4.2 01/28/2012 0625   CL 118* 01/28/2012 0625   CO2 16* 01/28/2012 0625   BUN 56* 01/28/2012 0625   CREATININE 1.62* 01/28/2012 0625   GLUCOSE 121* 01/28/2012 0625   CALCIUM 9.0 01/28/2012 0625   CBC:    Component Value Date/Time   WBC 3.4* 01/25/2012 2333   HGB 11.5* 01/25/2012 2333   HCT 34.7* 01/25/2012 2333   PLT 180 01/25/2012 2333   MCV 81.8 01/25/2012 2333   NEUTROABS 2.8 01/25/2012 1116   LYMPHSABS 0.9 01/25/2012 1116   MONOABS 0.3 01/25/2012 1116   EOSABS 0.0 01/25/2012 1116   BASOSABS 0.0 01/25/2012 1116      Lab 01/25/12 2333 01/25/12 1116  WBC 3.4* 4.0  HGB 11.5* 11.8*  HCT 34.7* 37.2  PLT 180  169  MCV 81.8 83.2  MCH 27.1 26.4  MCHC 33.1 31.7  RDW 18.3* 18.3*  LYMPHSABS -- 0.9  MONOABS -- 0.3  EOSABS -- 0.0  BASOSABS -- 0.0  BANDABS -- --    Lab 01/28/12 0625 01/25/12 2333 01/25/12 1116  NA 141 -- 142  K 4.2 -- 4.3  CL 118* -- 116*  CO2 16* -- 17*  GLUCOSE 121* -- 135*  BUN 56* -- 73*  CREATININE 1.62* 1.54* 1.77*  CALCIUM 9.0 -- 8.6  MG -- -- --   No results found for this basename: INR:5,PROTIME:5 in the last 168 hours Cardiac markers: No results found for this basename: CK:3,CKMB:3,TROPONINI:3,MYOGLOBIN:3 in the last 168 hours No components found with this basename: POCBNP:3 No results found for this or any previous visit (from the past 240 hour(s)).  Studies/Results: Mr Maxine Glenn Head/brain Wo Cm  01/27/2012  *RADIOLOGY REPORT*  Clinical Data: Stroke. Mental status changes.  MRA HEAD WITHOUT CONTRAST  Technique: Angiographic images of the Circle of Willis were obtained using MRA technique without intravenous contrast.  Comparison: None. MRI of the brain of 01/25/2012.  Findings:  The  right internal carotid artery in its petrous, cavernous and supraclinoid segments is  widely patent.  T he right middle cerebral artery  in the mid M1 segment demonstrates a focal signal drop off with reconstitution of signal distal to this.  The trifurcation branches widely patent.  The right anterior cerebral artery to the A2 region demonstrates adequate caliber and flow signal.  The left internal carotid its petrous cavernous and supraclinoid segments are widely patent  Arising in the superior hypophyseal region of the left internal carotid artery is a saccular outpouching projecting medially.  This measures  approximately 5 mm x 3.8 mm and  is suspicious of an intracranial aneurysm.  The left middle cerebral artery and left anterior cerebral artery demonstrate adequate caliber and flow signal into the tertiary branches.  Anterior communicating artery is patent.  The right vertebrobasilar  junction is patent with some tapered narrowing of the right vertebrobasilar junction at the confluence with the contralateral vertebrobasilar junction to form the basilar artery.  The left vertebrobasilar junction demonstrates a somewhat significant narrowing distal to the left posterior inferior cerebellar artery.  There is a mild to moderate decreased caliber of the mid basilar artery.  The basilar artery distal to this is of adequate caliber and signal.  The right posterior cerebral artery has a signal drop off in its distal P1 segment with flow signal demonstrated distal to this.  The left posterior cerebral artery also demonstrates less prominent decreased signal proximally.  The superior cerebellar arteries are patent.  A prominent right anterior-inferior cerebellar is seen.  Impression 1. 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 2.  Focal areas of significant signal drop-off involving the right middle cerebral artery, the posterior cerebral arteries and vertebrobasilar junctions.  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  Original Report Authenticated By: Oneal Grout, M.D.    Medications: Scheduled Meds:   . aspirin  325 mg Oral Daily  . docusate sodium  100 mg Oral Daily  . enoxaparin (LOVENOX) injection  30 mg Subcutaneous Q24H  . insulin aspart  0-5 Units Subcutaneous QHS  . insulin aspart  0-9 Units Subcutaneous TID WC  . iron polysaccharides  150 mg Oral Daily  . levETIRAcetam  1,000 mg Oral BID  . nicotine  21 mg Transdermal Daily  . simvastatin  10 mg Oral q1800  . white petrolatum      . DISCONTD: fentaNYL  250 mcg Intravenous Once  . DISCONTD: midazolam  10 mg Intravenous Once  . DISCONTD: nitrofurantoin  100 mg Oral BID  . DISCONTD: sodium chloride  3 mL Intravenous Q12H   Continuous Infusions:   . DISCONTD: sodium chloride    . DISCONTD: sodium chloride 500 mL (01/28/12  1107)   PRN Meds:.acetaminophen, acetaminophen, food thickener, food thickener, hydrALAZINE, LORazepam, ondansetron (ZOFRAN) IV, senna-docusate, DISCONTD: benzocaine, DISCONTD: butamben-tetracaine-benzocaine, DISCONTD: fentaNYL, DISCONTD: midazolam, DISCONTD: sodium chloride  Assessment/Plan:  Principal Problem:  *Acute CVA Left frontal lobe,  -Neuro recommended TEE, Follow up swallowing evaluation.  -HgbA1c: 6.8  -fasting lipid panel: LDL 15 triglycerides 163 currently on a statin.  -MRI, MRA : With result as above. Doppler studies pending.  -PT consult- recommended SNF  -Prophylactic therapy-Antiplatelet med: Aspirin - dose 325 mg.  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  -Keep MAP 70  -tobacco counseling cessation.  - Hypertension:  Controlled,  - Diabetes mellitus:  Controlled. Hemoglobin A1c is 6.8.  - ARF (acute renal failure):  Baseline creatinine 1.2.  Currently currently trending down with IV fluids 1.5 check a basic metabolic panel in the morning.    EDUCATION - test results and diagnostic studies were discussed with patient and pt's family who was present at the bedside - patient and family have verbalized the understanding - questions were answered at the bedside and contact information was provided for additional questions or concerns   LOS: 3 days   Bernabe Dorce 01/28/2012, 10:23 PM  [facility] Pager: [phone]

## 2012-01-28 NOTE — H&P (View-Only) (Signed)
Subjective: No complains. Objective: Filed Vitals:   01/26/12 1624 01/26/12 2200 01/27/12 0600 01/27/12 0900  BP: 111/69 116/65 95/62 119/74  Pulse: 68 72 41 73  Temp: 97.9 F (36.6 C) 97.5 F (36.4 C) 97.4 F (36.3 C) 97.8 F (36.6 C)  TempSrc: Oral   Oral  Resp: 18 16 16 18   Height:      Weight:      SpO2: 99% 98% 100% 97%   Weight change:  No intake or output data in the 24 hours ending 01/27/12 1039  General: Alert, awake, oriented x3, in no acute distress.  HEENT: No bruits, no goiter.  Heart: Regular rate and rhythm, without murmurs, rubs, gallops.  Lungs: Crackles left side, bilateral air movement.  Abdomen: Soft, nontender, nondistended, positive bowel sounds.  Neuro: Grossly intact, nonfocal.   Lab Results:  [facility] 01/25/12 2333 01/25/12 1116  NA -- 142  K -- 4.3  CL -- 116*  CO2 -- 17*  GLUCOSE -- 135*  BUN -- 73*  CREATININE 1.54* 1.77*  CALCIUM -- 8.6  MG -- --  PHOS -- --    Basename 01/25/12 1116  AST 22  ALT 16  ALKPHOS 54  BILITOT 0.2*  PROT 9.8*  ALBUMIN 3.3*   No results found for this basename: LIPASE:2,AMYLASE:2 in the last 72 hours  Basename 01/25/12 2333 01/25/12 1116  WBC 3.4* 4.0  NEUTROABS -- 2.8  HGB 11.5* 11.8*  HCT 34.7* 37.2  MCV 81.8 83.2  PLT 180 169   No results found for this basename: CKTOTAL:3,CKMB:3,CKMBINDEX:3,TROPONINI:3 in the last 72 hours No components found with this basename: POCBNP:3 No results found for this basename: DDIMER:2 in the last 72 hours  Basename 01/26/12 1351 01/26/12 0540  HGBA1C 6.8* 6.9*    Basename 01/26/12 0540  CHOL 179  HDL 41  LDLCALC 105*  TRIG 163*  CHOLHDL 4.4  LDLDIRECT --    Basename 01/26/12 0540  TSH 4.285  T4TOTAL --  T3FREE --  THYROIDAB --   No results found for this basename: VITAMINB12:2,FOLATE:2,FERRITIN:2,TIBC:2,IRON:2,RETICCTPCT:2 in the last 72 hours  Micro Results: No results found for this or any previous visit (from the past 240  hour(s)).  Studies/Results: Dg Chest 2 View  01/25/2012  *RADIOLOGY REPORT*  Clinical Data: Stroke, history diabetes, hypertension, smoking, CABG  CHEST - 2 VIEW  Comparison: 10/01/2011  Findings: Enlargement of cardiac silhouette post CABG. Tortuous aorta. Pulmonary vascularity normal. Minimally prominent right paratracheal soft tissues stable. Lungs clear. No pleural effusion or pneumothorax. No acute osseous findings.  IMPRESSION: Enlargement of cardiac silhouette post CABG. No acute abnormalities.  Original Report Authenticated By: Lollie Marrow, M.D.   Ct Head Wo Contrast  01/25/2012  *RADIOLOGY REPORT*  Clinical Data: Altered mental status.  CT HEAD WITHOUT CONTRAST  Technique:  Contiguous axial images were obtained from the base of the skull through the vertex without contrast.  Comparison: 05/16/2007  Findings: There is moderate central and cortical atrophy. Periventricular white matter changes are consistent with small vessel disease.  Multiple old lacunar infarctions are identified within the basal ganglia and thalami bilaterally.  Chronic infarct is identified within the right occipital lobe, new since the prior study.  Bone windows show no acute abnormalities.  IMPRESSION:  1.  Significant atrophy and small vessel disease. 2.  Old infarcts involving the basal ganglia bilaterally. 3.  Interval but chronic infarct involving the right occipital lobe. 4. No evidence for acute intracranial abnormality.  Original Report Authenticated By: Patterson Hammersmith, M.D.  Mr Brain Wo Contrast  01/25/2012  *RADIOLOGY REPORT*  Clinical Data: Weakness and shaking.  Confusion.  Altered mental status.  MRI HEAD WITHOUT CONTRAST  Technique:  Multiplanar, multiecho pulse sequences of the brain and surrounding structures were obtained according to standard protocol without intravenous contrast.  Comparison: CT 01/25/2012, MRI 10/14/2011  Findings: Small acute infarct left frontal cortex over the convexity.  No other  acute infarct.  Moderate atrophy.  Extensive chronic ischemic change throughout the basal ganglia bilaterally.  Interval development of infarct in the right inferior occipital lobe since the prior MRI.  This has a chronic appearance.  Small chronic infarct left parietal lobe is unchanged.  Negative for hemorrhage or mass lesion.  Paranasal sinuses are clear.  IMPRESSION: Sub centimeter focus of acute infarct in the high left frontal lobe.  Original Report Authenticated By: Camelia Phenes, M.D.   Mr Mra Head/brain Wo Cm  01/27/2012  *RADIOLOGY REPORT*  Clinical Data: Stroke. Mental status changes.  MRA HEAD WITHOUT CONTRAST  Technique: Angiographic images of the Circle of Willis were obtained using MRA technique without intravenous contrast.  Comparison: None. MRI of the brain of 01/25/2012.  Findings:  The  right internal carotid artery in its petrous, cavernous and supraclinoid segments is widely patent.  T he right middle cerebral artery  in the mid M1 segment demonstrates a focal signal drop off with reconstitution of signal distal to this.  The trifurcation branches widely patent.  The right anterior cerebral artery to the A2 region demonstrates adequate caliber and flow signal.  The left internal carotid its petrous cavernous and supraclinoid segments are widely patent  Arising in the superior hypophyseal region of the left internal carotid artery is a saccular outpouching projecting medially.  This measures  approximately 5 mm x 3.8 mm and  is suspicious of an intracranial aneurysm.  The left middle cerebral artery and left anterior cerebral artery demonstrate adequate caliber and flow signal into the tertiary branches.  Anterior communicating artery is patent.  The right vertebrobasilar junction is patent with some tapered narrowing of the right vertebrobasilar junction at the confluence with the contralateral vertebrobasilar junction to form the basilar artery.  The left vertebrobasilar junction  demonstrates a somewhat significant narrowing distal to the left posterior inferior cerebellar artery.  There is a mild to moderate decreased caliber of the mid basilar artery.  The basilar artery distal to this is of adequate caliber and signal.  The right posterior cerebral artery has a signal drop off in its distal P1 segment with flow signal demonstrated distal to this.  The left posterior cerebral artery also demonstrates less prominent decreased signal proximally.  The superior cerebellar arteries are patent.  A prominent right anterior-inferior cerebellar is seen.  Impression 1. 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 2.  Focal areas of significant signal drop-off involving the right middle cerebral artery, the posterior cerebral arteries and vertebrobasilar junctions.  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  Original Report Authenticated By: Oneal Grout, M.D.    Medications: I have reviewed the patient's current medications.  Assessment and plan: Principal Problem:  *Acute CVA Left frontal lobe, -Neuro recommended TEE, NPO after midnight. Check a swallowing evaluation. -HgbA1c: 6.8 -fasting lipid panel: LDL 15 triglycerides 163 currently on a statin. -MRI, MRA : With result as above. Doppler studies pending. -PT consult, OT consult, Speech Pending. -Prophylactic therapy-Antiplatelet med: Aspirin - dose 325 mg. 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 Vear Clock was within normal limits. -Keep MAP 70 -tobacco counseling cessation.  - Hypertension: Controlled, has had one episode of blood pressure below 100, we'll continue to hold blood pressure medications.  - Diabetes mellitus: Controlled. Hemoglobin A1c is 6.8.   - ARF (acute renal failure):  Baseline creatinine 1.2. Currently currently trending down with IV fluids 1.5 check a basic metabolic  panel in the morning.    LOS: 2 days   Marinda Elk M.D. Pager: [phone] [facility] 01/27/2012, 10:39 AM

## 2012-01-28 NOTE — Progress Notes (Signed)
  Echocardiogram Echocardiogram Transesophageal has been performed.  [name] Nira Retort 01/28/2012, 12:12 PM

## 2012-01-28 NOTE — Progress Notes (Signed)
Physical Therapy Treatment Patient Details Name: [name] MRN: [id number] DOB: October 06, 1941 Today's Date: 01/28/2012  PT Assessment/Plan  PT - Assessment/Plan Comments on Treatment Session: Patient exhibited decreases static and dynamic balance and veered to the left during ambulation.  RN notified of pt's increase balance impairment. PT Plan: Discharge plan needs to be updated PT Frequency: Min 4X/week Follow Up Recommendations: Skilled nursing facility;Supervision/Assistance - 24 hour Equipment Recommended: Standard walker;3 in 1 bedside comode;Tub/shower seat PT Goals  Acute Rehab PT Goals PT Goal Formulation: With patient/family Time For Goal Achievement: 7 days Pt will Ambulate: >150 feet;with modified independence;with least restrictive assistive device PT Goal: Ambulate - Progress: Not progressing (Patient exhibited decreased balance and required min assist.)  PT Treatment Precautions/Restrictions  Precautions Precautions: Fall Restrictions Weight Bearing Restrictions: No Mobility (including Balance) Bed Mobility Bed Mobility: Yes Supine to Sit: 5: Supervision (Pt used UE assist.) Sitting - Scoot to Edge of Bed: 5: Supervision Transfers Transfers: Yes Sit to Stand: Other (comment) (Min guard.  Pt required manual & VC for hand placement) Stand to Sit: 4: Min assist (Pt required manual & VC for hand placement) Ambulation/Gait Ambulation/Gait: Yes Ambulation/Gait Assistance: 4: Min assist Ambulation/Gait Assistance Details (indicate cue type and reason): Patient required min assist for safety and to manage RW with veering to the left. Ambulation Distance (Feet): 80 Feet Assistive device: Rolling walker Gait Pattern: Shuffle;Decreased stride length;Left foot flat;Lateral trunk lean to left;Decreased trunk rotation Stairs: No  Posture/Postural Control Posture/Postural Control: No significant limitations Balance Balance Assessed: Yes Static Standing Balance Static  Standing - Balance Support: No upper extremity supported Static Standing - Level of Assistance: 4: Min assist (Pt displayed posterior lean, min assist to maintain balance.) Static Standing - Comment/# of Minutes: 5 minutes Tandem Stance - Right Leg: 0  (Attempted. Unable to perform.) Tandem Stance - Left Leg: 0  (Attempted. Unable to perform.) [place] - Eyes Closed: 0  (Attempted. Unable to perform.) Exercise    End of Session PT - End of Session Equipment Utilized During Treatment: Gait belt Activity Tolerance: Other (comment) (Patient appeared lethargic secondary to medications) Patient left: in chair;with call bell in reach Nurse Communication: Other (comment) (Change in patient's balance and decrease increased lethargy) General Behavior During Session: Lethargic Cognition: Impaired  Ezzard Standing SPT 01/28/2012, 3:59 PM  Bajandas, PT DPT [phone]

## 2012-01-28 NOTE — Progress Notes (Signed)
Utilization review completed.  

## 2012-01-28 NOTE — Interval H&P Note (Signed)
History and Physical Interval Note:  01/28/2012 11:31 AM  [name]  has presented today for surgery, with the diagnosis of stroke  The various methods of treatment have been discussed with the patient and family. After consideration of risks, benefits and other options for treatment, the patient has consented to  Procedure(s) (LRB): TRANSESOPHAGEAL ECHOCARDIOGRAM (TEE) ([name]) as a surgical intervention .  The patients' history has been reviewed, patient examined, no change in status, stable for surgery.  I have reviewed the patients' chart and labs.  Questions were answered to the patient's satisfaction.     [name]

## 2012-01-28 NOTE — Evaluation (Signed)
Occupational Therapy Evaluation Patient Details Name: [name] MRN: [id number] DOB: [birth year] Today's Date: 01/28/2012  Problem List:  Patient Active Problem List  Diagnoses  . Acute CVA Left frontal lobe,  . Hypertension  . Diabetes mellitus  . Seizure-like activity  . ARF (acute renal failure)    Past Medical History:  Past Medical History  Diagnosis Date  . Diabetes mellitus   . Hypertension   . Stroke    Past Surgical History:  Past Surgical History  Procedure Date  . Coronary artery bypass graft     OT Assessment/Plan/Recommendation OT Assessment Clinical Impression Statement: 71 yo female presenting with seizure like activity. Pt found to have Lt frontal CVA . Pt could benefit from acute OT .  OT Recommendation/Assessment: Patient will need skilled OT in the acute care venue OT Problem List: Decreased activity tolerance;Impaired balance (sitting and/or standing);Decreased knowledge of use of DME or AE OT Therapy Diagnosis : Generalized weakness OT Plan OT Frequency: Min 2X/week OT Treatment/Interventions: Balance training;Patient/family education;Therapeutic activities;Cognitive remediation/compensation;Self-care/ADL training;Therapeutic exercise OT Recommendation Follow Up Recommendations: Skilled nursing facility (must have 24/ 7 (A) at d/c) Equipment Recommended: None recommended by OT Individuals Consulted Consulted and Agree with Results and Recommendations: Patient unable/family or caregiver not available OT Goals Acute Rehab OT Goals OT Goal Formulation: With patient/family Time For Goal Achievement: 2 weeks ADL Goals Pt Will Perform Grooming: with modified independence;Standing at sink;Unsupported ADL Goal: Grooming - Progress: Goal set today Pt Will Perform Upper Body Bathing: with modified independence;Standing at sink ADL Goal: Upper Body Bathing - Progress: Goal set today Pt Will Perform Lower Body Bathing: with modified independence;Standing  at sink ADL Goal: Lower Body Bathing - Progress: Goal set today Pt Will Perform Upper Body Dressing: with modified independence;Sit to stand from chair ADL Goal: Upper Body Dressing - Progress: Goal set today Pt Will Perform Lower Body Dressing: with modified independence;Sit to stand from bed ADL Goal: Lower Body Dressing - Progress: Goal set today Pt Will Transfer to Toilet: with modified independence;3-in-1 ADL Goal: Toilet Transfer - Progress: Goal set today  OT Evaluation Precautions/Restrictions  Precautions Precautions: Fall Restrictions Weight Bearing Restrictions: No Prior Functioning Home Living Lives With: Daughter Receives Help From: Family Type of Home: Apartment Home Layout: One level Alternate Level Stairs-Rails: None Home Access: Stairs to enter Entrance Stairs-Rails: Right;Left;Can reach both Entrance Stairs-Number of Steps: 7-8 Bathroom Shower/Tub: Engineer, manufacturing systems: Standard Home Adaptive Equipment: Walker - rolling Prior Function Level of Independence: Needs assistance with homemaking;Independent with transfers;Independent with gait;Needs assistance with ADLs Able to Take Stairs?: Yes Driving: No Vocation: Retired ADL ADL Grooming: Performed;Wash/dry hands;Teeth care;Supervision/safety Grooming Details (indicate cue type and reason): pt with poor lip closure with oral care. Pt with wound on lip that opened s/p cough and continued to bleed during session. Pt reports obtaining lip wound from cigarette and unable to give specific time of onset. Pt provided vascline to apply to lip to decrease bleeding and add moisture to lips. Pt perservating on oral care and required v/c to terminate task. Pt with secretions dripping on to shirt with lack of awareness.  Where Assessed - Grooming: Standing at sink Upper Body Dressing: Performed;Supervision/safety Where Assessed - Upper Body Dressing: Sitting, bed;Unsupported Toilet Transfer:  Simulated;Supervision/safety Toilet Transfer Method: Proofreader: Raised toilet seat with arms (or 3-in-1 over toilet) Equipment Used: Rolling walker Ambulation Related to ADLs: Pt ambulating with decreased Lt Le strength noted. Pt with Lt hip externally rotated. Pt with decreased gait  velocity ADL Comments: Pt with correct sequence with sink level adls however perservating with oral care.  Vision/Perception  Vision - History Patient Visual Report: No change from baseline Vision - Assessment Vision Assessment: Vision tested Ocular Range of Motion: Impaired-to be further tested in functional context (pt unable to follow command) Tracking/Visual Pursuits: Impaired - to be further tested in functional context Convergence: Impaired - to be further tested in functional context Additional Comments: Pt unable to follow directions for full visual assessment Cognition Cognition Arousal/Alertness: Awake/alert Overall Cognitive Status: History of cognitive impairments Orientation Level: Oriented to person;Oriented to place;Oriented to situation Sensation/Coordination Sensation Light Touch: Appears Intact (reports tingling on UE and LE no numbness however) Coordination Gross Motor Movements are Fluid and Coordinated: Yes Fine Motor Movements are Fluid and Coordinated: Yes Extremity Assessment RUE Assessment RUE Assessment: Within Functional Limits LUE Assessment LUE Assessment: Within Functional Limits Mobility  Bed Mobility Bed Mobility: Yes Supine to Sit: 5: Supervision (Pt used UE assist.) Sitting - Scoot to Edge of Bed: 5: Supervision Transfers Transfers: Yes Sit to Stand: Other (comment) (Min guard.  Pt required manual & VC for hand placement) Stand to Sit: 4: Min assist (Pt required manual & VC for hand placement) Exercises   End of Session OT - End of Session Equipment Utilized During Treatment: Gait belt Activity Tolerance: Patient tolerated treatment  well Patient left:  (with PT in room providing treatment) Nurse Communication: Mobility status for transfers;Mobility status for ambulation General Behavior During Session: [facility] for tasks performed Cognition: Impaired, at baseline   Lucile Shutters 01/28/2012, 3:41 PM  Pager: [phone]

## 2012-01-28 NOTE — Progress Notes (Signed)
PT Cancellation Note  Treatment cancelled today due to patient receiving procedure or test.  PT to check back later as time allows to work with the patient.    Rollene Rotunda Malina Geers, PT, DPT [phone] 01/28/2012, 11:14 AM

## 2012-01-28 NOTE — Procedures (Signed)
See report in [employer]; Inferoposterior/inferoseptal basal akinesis; EF 40-45; no source of embolus identified. [name]

## 2012-01-28 NOTE — Progress Notes (Signed)
Speech Language/Pathology SLP Cancellation Note  Treatment cancelled today due to patient receiving procedure or test. Patient NPO secondary to scheduled for TEE this morning. Will re-schedule MBS for tomorrow morning.   Ferdinand Lango MA, CCC-SLP [phone]   Ferdinand Lango Meryl 01/28/2012, 9:54 AM

## 2012-01-29 ENCOUNTER — Inpatient Hospital Stay (HOSPITAL_COMMUNITY): Payer: Medicare Other

## 2012-01-29 ENCOUNTER — Encounter (HOSPITAL_COMMUNITY): Payer: Self-pay | Admitting: Cardiology

## 2012-01-29 LAB — URINE MICROSCOPIC-ADD ON

## 2012-01-29 LAB — URINALYSIS, ROUTINE W REFLEX MICROSCOPIC
Bilirubin Urine: NEGATIVE
Glucose, UA: NEGATIVE mg/dL
Ketones, ur: NEGATIVE mg/dL
Leukocytes, UA: NEGATIVE
pH: 6 (ref 5.0–8.0)

## 2012-01-29 LAB — CBC
Hemoglobin: 11.1 g/dL — ABNORMAL LOW (ref 12.0–15.0)
MCH: 26.7 pg (ref 26.0–34.0)
MCHC: 32.4 g/dL (ref 30.0–36.0)
Platelets: 157 10*3/uL (ref 150–400)

## 2012-01-29 LAB — BASIC METABOLIC PANEL
Calcium: 8.2 mg/dL — ABNORMAL LOW (ref 8.4–10.5)
GFR calc non Af Amer: 36 mL/min — ABNORMAL LOW (ref >90)
Glucose, Bld: 126 mg/dL — ABNORMAL HIGH (ref 70–99)
Sodium: 137 mEq/L (ref 135–145)

## 2012-01-29 LAB — GLUCOSE, CAPILLARY: Glucose-Capillary: 145 mg/dL — ABNORMAL HIGH (ref 70–99)

## 2012-01-29 MED ORDER — SODIUM CHLORIDE 0.9 % IV SOLN
INTRAVENOUS | Status: DC
  Administered 2012-01-29: 20:00:00 via INTRAVENOUS

## 2012-01-29 NOTE — Progress Notes (Signed)
Clinical Social Worker completed psychosocial assessment and placed in shadow chart. Clinical Social Worker spoke with pt and pt daughters at bedside to discuss pt discharge needs. Discussed recommendation for SNF for short term rehabilitation. Pt with hesitancy about placement, but pt daughter report pt lives with pt daughter and pt will not have 24 hour supervision at home and will need short term rehabilitation. Pt and pt daughter live in [place] and pt daughter preference [facility] [place]. Clinical Social Worker completed FL-2 and initiated SNF search to [facility] and contacted [facility] [place] to inform of pt family interest in facility. Clinical Social Worker to follow up with bed offers and facilitate pt discharge needs when pt medically stable for discharge and bed available at SNF.  Jacklynn Lewis, MSW, LCSWA  Clinical Social Work [phone]

## 2012-01-29 NOTE — Progress Notes (Signed)
Stroke Team Progress Note  SUBJECTIVE [name] is an 71 y.o. female black presenting in the ER with a seizure. Her daughter states that this morning (01/25/12) the patient was sitting in a chair and suddenly started shaking. Her head was turned to the right and her eyes were turned to the right. This lasted for less than a minute she believes. No other parts of her body were shaking. The patient did have urine incontinence afterwards. She seemed a little bit confused afterwards but did not complain of a headache nor did she have her tongue bite. The patient has never had this happen before. She did not feel it coming on either. The patient currently feels fine. She does not having numbness or tingling anywhere. She does not have any problems with swallowing or any trouble with vision. The patient does not have any weakness in her face arms or legs. She does not have a headache. The patient does not have dizziness/vertigo/lightheadedness. Patient also does not have any difficulty with her speech.  The patient had gone to [facility] earlier today before the above episode. She was taken by her family because of altered mental status and an episode of shaking. The patient was discharged after a normal CT scan and normal labs.  The patient takes an aspirin occasionally. The last time she took an aspirin was about 4 days ago.  The patient is alert and cooperative, not oriented to place. The patient denies any headache, or any other discomfort     OBJECTIVE Most recent Vital Signs: Temp: 97.3 F (36.3 C) (03/07 1000) Temp src: Oral (03/07 1000) BP: 121/73 mmHg (03/07 1000) Pulse Rate: 75  (03/07 1000) Respiratory Rate: 18 O2 Saturdation: 98%  CBG (last 3)   Basename 01/29/12 1142 01/29/12 0658 01/28/12 2220  GLUCAP 145* 118* 171*    Diet: Dysphagia   Activity: Up with assistance   VTE Prophylaxis:  lovenox  Studies: Results for orders placed during the hospital encounter of  01/25/12 (from the past 24 hour(s))  GLUCOSE, CAPILLARY     Status: Abnormal   Collection Time   01/28/12  4:57 PM      Component Value Range   Glucose-Capillary 169 (*) 70 - 99 (mg/dL)   Comment 1 Documented in Chart     Comment 2 Notify RN    GLUCOSE, CAPILLARY     Status: Abnormal   Collection Time   01/28/12 10:20 PM      Component Value Range   Glucose-Capillary 171 (*) 70 - 99 (mg/dL)  URINALYSIS, ROUTINE W REFLEX MICROSCOPIC     Status: Abnormal   Collection Time   01/29/12  3:08 AM      Component Value Range   Color, Urine YELLOW  YELLOW    APPearance CLEAR  CLEAR    Specific Gravity, Urine 1.017  1.005 - 1.030    pH 6.0  5.0 - 8.0    Glucose, UA NEGATIVE  NEGATIVE (mg/dL)   Hgb urine dipstick LARGE (*) NEGATIVE    Bilirubin Urine NEGATIVE  NEGATIVE    Ketones, ur NEGATIVE  NEGATIVE (mg/dL)   Protein, ur 562 (*) NEGATIVE (mg/dL)   Urobilinogen, UA 0.2  0.0 - 1.0 (mg/dL)   Nitrite NEGATIVE  NEGATIVE    Leukocytes, UA NEGATIVE  NEGATIVE   URINE MICROSCOPIC-ADD ON     Status: Abnormal   Collection Time   01/29/12  3:08 AM      Component Value Range   Squamous  Epithelial / LPF RARE  RARE    WBC, UA 0-2  <3 (WBC/hpf)   RBC / HPF 21-50  <3 (RBC/hpf)   Bacteria, UA RARE  RARE    Casts HYALINE CASTS (*) NEGATIVE   CBC     Status: Abnormal   Collection Time   01/29/12  6:58 AM      Component Value Range   WBC 3.4 (*) 4.0 - 10.5 (K/uL)   RBC 4.16  3.87 - 5.11 (MIL/uL)   Hemoglobin 11.1 (*) 12.0 - 15.0 (g/dL)   HCT 16.1 (*) 09.6 - 46.0 (%)   MCV 82.5  78.0 - 100.0 (fL)   MCH 26.7  26.0 - 34.0 (pg)   MCHC 32.4  30.0 - 36.0 (g/dL)   RDW 04.5 (*) 40.9 - 15.5 (%)   Platelets 157  150 - 400 (K/uL)  BASIC METABOLIC PANEL     Status: Abnormal   Collection Time   01/29/12  6:58 AM      Component Value Range   Sodium 137  135 - 145 (mEq/L)   Potassium 4.5  3.5 - 5.1 (mEq/L)   Chloride 113 (*) 96 - 112 (mEq/L)   CO2 15 (*) 19 - 32 (mEq/L)   Glucose, Bld 126 (*) 70 - 99 (mg/dL)    BUN 44 (*) 6 - 23 (mg/dL)   Creatinine, Ser 8.11 (*) 0.50 - 1.10 (mg/dL)   Calcium 8.2 (*) 8.4 - 10.5 (mg/dL)   GFR calc non Af Amer 36 (*) >90 (mL/min)   GFR calc Af Amer 42 (*) >90 (mL/min)  GLUCOSE, CAPILLARY     Status: Abnormal   Collection Time   01/29/12  6:58 AM      Component Value Range   Glucose-Capillary 118 (*) 70 - 99 (mg/dL)  GLUCOSE, CAPILLARY     Status: Abnormal   Collection Time   01/29/12 11:42 AM      Component Value Range   Glucose-Capillary 145 (*) 70 - 99 (mg/dL)     Dg Swallowing Func-no Report  01/29/2012  CLINICAL DATA: dysphagia   FLUOROSCOPY FOR SWALLOWING FUNCTION STUDY:  Fluoroscopy was provided for swallowing function study, which was  administered by a speech pathologist.  Final results and recommendations  from this study are contained within the speech pathology report.    carotid dopplers 01/27/12 no significant stenosis EEG pending Physical Exam:     Mental Status:  Alert, oriented, thought content appropriate. Speech fluent without evidence of aphasia. Able to follow 3 step commands without difficulty.  The patient is alert and cooperative.  Neurologic exam reveals full extraocular movements, speech is slightly dysarthric. Visual fields are full.  Motor testing reveals good strength of all four extremities.  The patient has good finger-nose-finger and heel-to-shin bilaterally. Gait was not tested.  Deep tendon reflexes are symmetric  ASSESSMENT [name] is a 70 y.o. female with past medical history diabetes, hypertension, stroke, coronary artery disease status post bypass surgery, and tobacco dependent presenting in the ER with a seizure.  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 Patient may be discharged and can follow up as an  outpatient. Stroke team will sign off.  Gates Rigg, MD [facility] Pager: [phone] 01/29/2012 1:09 PM

## 2012-01-29 NOTE — Progress Notes (Signed)
Pt continually complaining of needing to urinate, but when taken to [facility] unable. Bladder scan 515 around 2330. Pt able to void only 150 at 0200. Bladder scan 339 still in bladder. Maren Reamer called regarding new onset retention. In and out catherization ordered. Will continue to monitor output.  Salvadore Oxford, RN [phone]

## 2012-01-29 NOTE — Progress Notes (Signed)
Occupational Therapy Treatment Patient Details Name: [name] MRN: [id number] DOB: 04/08/1941 Today's Date: 01/29/2012  OT Assessment/Plan OT Assessment/Plan Comments on Treatment Session: Pt impulsive and with difficulty problem solving OT Plan: Discharge plan remains appropriate Follow Up Recommendations: Skilled nursing facility Equipment Recommended: None recommended by OT OT Goals ADL Goals ADL Goal: Grooming - Progress: Progressing toward goals ADL Goal: Upper Body Bathing - Progress: Progressing toward goals ADL Goal: Lower Body Bathing - Progress: Progressing toward goals ADL Goal: Lower Body Dressing - Progress: Progressing toward goals Pt Will Perform Tub/Shower Transfer: Tub transfer;with supervision ADL Goal: Tub/Shower Transfer - Progress: Goal set today  OT Treatment Precautions/Restrictions  Precautions Precautions: Fall Restrictions Weight Bearing Restrictions: No   ADL ADL Grooming: Performed;Wash/dry hands;Supervision/safety Grooming Details (indicate cue type and reason): Pt turned both hot and cold water on full blast with water splashing everywhere. Pt appeared oblivious to this and dunked hands under water then attempted to turn water off. Stopped pt and had her turn water down and use soap to thoroughly wash hands.  Where Assessed - Grooming: Standing at sink Upper Body Bathing: Simulated (Min guard A) Upper Body Bathing Details (indicate cue type and reason): pt kept one hand on wall during simulated bathing. would sway when asked to remove hand from wall Where Assessed - Upper Body Bathing: Standing in shower Lower Body Bathing: Simulated;Minimal assistance Where Assessed - Lower Body Bathing: Standing in shower Lower Body Dressing: Performed;Supervision/safety Lower Body Dressing Details (indicate cue type and reason): donned and doffed socks. pt impulsive and with trouble grading movements Where Assessed - Lower Body Dressing: Sitting,  bed Tub/Shower Transfer: Performed;Minimal assistance Tub/Shower Transfer Details (indicate cue type and reason): Min assist to maintain balance and for VC for hand placement Tub/Shower Transfer Method: Ambulating Equipment Used: Rolling walker Ambulation Related to ADLs: Min A with RW ambulation as pt running into objects on left. Also noted lt knee buckling which pt able to self-correct Mobility  Bed Mobility Supine to Sit: 5: Supervision Sit to Supine: 5: Supervision Sit to Supine - Details (indicate cue type and reason): Supervision for safety Transfers Sit to Stand: Other (comment);From toilet;From bed;4: Min assist Sit to Stand Details (indicate cue type and reason): Min (A) from toliet and min guard from bed.  Pt completed several sit stands for mini squats and needed increase assistance with more squats completed. Stand to Sit: To bed;Other (comment) (minguard) Stand to Sit Details: Minguard for safety with max cues for hand placement.  End of Session OT - End of Session Equipment Utilized During Treatment: Gait belt Activity Tolerance: Patient tolerated treatment well ("sleepy" at end of session) Patient left: in bed;with call bell in reach;with bed alarm set Nurse Communication: Mobility status for transfers;Mobility status for ambulation General Behavior During Session: Flat affect Cognition: Impaired (unsure if baseline?)  [name]  01/29/2012, 3:43 PM

## 2012-01-29 NOTE — Progress Notes (Signed)
Physical Therapy Treatment Patient Details Name: [name] MRN: [id number] DOB: 05-20-1941 Today's Date: 01/29/2012  PT Assessment/Plan  PT - Assessment/Plan Comments on Treatment Session: Pt continues to have unsteady gait and needs min (A) to maintian balance with max cues for hand and RW placement.  Pt with noticeable left LE knee buckling at times with ambulation and sit-> stand.  Continue to recommend SNF at d/c for further strengthening. PT Plan: Discharge plan remains appropriate;Frequency remains appropriate PT Frequency: Min 4X/week Follow Up Recommendations: Skilled nursing facility;Supervision/Assistance - 24 hour Equipment Recommended: Standard walker;3 in 1 bedside comode;Tub/shower seat PT Goals  Acute Rehab PT Goals PT Goal Formulation: With patient/family Time For Goal Achievement: 7 days Pt will Ambulate: >150 feet;with modified independence;with least restrictive assistive device PT Goal: Ambulate - Progress: Progressing toward goal Additional Goals Additional Goal #1: Patient will complete timed up and go test in less than 13.5 seconds to indicate decreased risk of fall PT Goal: Additional Goal #1 - Progress: Not met (pt unable to perform due to the need for min (A) with RW)  PT Treatment Precautions/Restrictions  Precautions Precautions: Fall Restrictions Weight Bearing Restrictions: No Mobility (including Balance) Bed Mobility Sit to Supine: 5: Supervision Sit to Supine - Details (indicate cue type and reason): Supervision for safety Transfers Transfers: Yes Sit to Stand: Other (comment);From toilet;From bed;4: Min assist Sit to Stand Details (indicate cue type and reason): Min (A) from toliet and min guard from bed.  Pt completed several sit stands for mini squats and needed increase assistance with more squats completed. Stand to Sit: To bed;Other (comment) (minguard) Stand to Sit Details: Minguard for safety with max cues for hand  placement. Ambulation/Gait Ambulation/Gait: Yes Ambulation/Gait Assistance: 4: Min assist Ambulation/Gait Assistance Details (indicate cue type and reason): Pt continues to require min (A) to maintain balance due to left sided foot flat and shuffle gait.  Pt ambulates with left foot inverted inward.  Ambulation Distance (Feet): 100 Feet Assistive device: Rolling walker Gait Pattern: Shuffle;Decreased stride length;Left foot flat;Lateral trunk lean to left;Decreased trunk rotation Stairs: No  Posture/Postural Control Postural Limitations: With dynamic activities patient with increased postural sway. With tendency for left side flexion of trunk/veer left. Balance Balance Assessed: Yes Static Sitting Balance Static Sitting - Balance Support: Feet supported;No upper extremity supported Static Sitting - Level of Assistance: 7: Independent Static Standing Balance Static Standing - Balance Support: No upper extremity supported Static Standing - Level of Assistance: 4: Min assist (minguard) Static Standing - Comment/# of Minutes: ~5 minutes Tandem Stance - Right Leg: 10  (10 seconds with min (A) to complete proper LE placement) Dynamic Standing Balance Dynamic Standing - Balance Support: Right upper extremity supported Dynamic Standing - Level of Assistance: 4: Min assist Dynamic Standing - Balance Activities: Reaching across midline Dynamic Standing - Comments: Min (A) to prevent left posterior lean and LOB Exercise  General Exercises - Lower Extremity Hip Flexion/Marching: Strengthening;Both;10 reps;Standing Mini-Sqauts: Strengthening;10 reps;Standing End of Session PT - End of Session Equipment Utilized During Treatment: Gait belt Activity Tolerance: Patient limited by fatigue Patient left: in bed;with call bell in reach;with bed alarm set Nurse Communication: Mobility status for transfers;Mobility status for ambulation General Behavior During Session: Flat affect Cognition:  Impaired  Shawnese Magner 01/29/2012, 3:10 PM Pager:  [phone]

## 2012-01-29 NOTE — Progress Notes (Signed)
Patient ID: [name], female   DOB: February 04, 1941, 72 y.o.   MRN: [id number]  Assessment/Plan:  Principal Problem:   *Acute CVA Left frontal lobe, - Neuro recommended TEE - no evidence of embolism; EF 40 - 45 - HgbA1c: 6.8  - fasting lipid panel: LDL 15 triglycerides 163 currently on a statin.  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 - stroke team following - as per stroke team small incidental aneurysm, patient can continue prophylactic aspirin - carotid doppler studies negative - PT consult- recommended SNF  - Prophylactic therapy-Antiplatelet med: Aspirin - dose 325 mg.  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   Active Problems:   Hypertension - BP 154/77 - considering patient's age this BP is at goal (SBP < 150)   Diabetes mellitus - CBG monitoring -sliding scale insulin   Seizure-like activity - continue Keppra even on discharge   ARF (acute renal failure) - creatinine slowly trending down - continue to monitor while in hospital - normal saline to reverse metabolic acidosis normal anion gap  EDUCATION  - test results and diagnostic studies were discussed with patient at the bedside  - patient and family have verbalized the understanding  - questions were answered at the bedside and contact information was provided for additional questions or concerns   Subjective: No events overnight. Patient denies chest pain, shortness of breath, abdominal pain. Had bowel movement and reports ambulating.  Objective:  Vital signs in last 24 hours:  Filed Vitals:   01/29/12 0531 01/29/12 0820 01/29/12 1000 01/29/12 1500  BP: 112/64 120/69 121/73 154/77  Pulse: 76 75 75 90  Temp: 97.5 F (36.4 C) 97.6 F (36.4 C) 97.3 F (36.3 C) 98.1 F (36.7 C)  TempSrc: Oral Oral Oral Oral  Resp: 18 18 18 18     SpO2: 68% 98% 98% 100%    Intake/Output from previous day:   Gross per 24 hour  Intake    476 ml  Output    300 ml  Net    176 ml    Physical Exam: General:  no acute distress. HEENT: No bruits, no goiter. Moist mucous membranes, no scleral icterus, no conjunctival pallor. Heart: Regular rate and rhythm, S1/S2 +, no murmurs, rubs, gallops. Lungs: Clear to auscultation bilaterally. No wheezing, no rhonchi, no rales.  Abdomen: Soft, nontender, nondistended, positive bowel sounds. Extremities: No clubbing or cyanosis, no pitting edema,  positive pedal pulses. Neuro: Grossly nonfocal.  Lab Results:  Basic Metabolic Panel:  NA 137 01/29/2012 0658   K 4.5 01/29/2012 0658   CL 113* 01/29/2012 0658   CO2 15* 01/29/2012 0658   BUN 44* 01/29/2012 0658   CREATININE 1.43* 01/29/2012 0658   GLUCOSE 126* 01/29/2012 0658   CALCIUM 8.2* 01/29/2012 0658   CBC:  WBC 3.4* 01/29/2012 0658   HGB 11.1* 01/29/2012 0658   HCT 34.3* 01/29/2012 0658   PLT 157 01/29/2012 0658      Lab 01/29/12 0658 01/25/12 2333 01/25/12 1116  WBC 3.4* 3.4* 4.0  HGB 11.1* 11.5* 11.8*  HCT 34.3* 34.7* 37.2  PLT 157 180 169  MCV 82.5 81.8 83.2    Lab 01/29/12 0658 01/28/12 0625 01/25/12 1116  NA 137 141 142  K 4.5 4.2 4.3  CL 113* 118* 116*  CO2 15* 16* 17*  GLUCOSE 126* 121* 135*  BUN 44* 56* 73*  CREATININE 1.43* 1.62* 1.77*  CALCIUM 8.2* 9.0 8.6   Studies/Results: Dg Swallowing Func-no Report 01/29/2012  CLINICAL DATA: dysphagia   FLUOROSCOPY FOR SWALLOWING FUNCTION STUDY:  Fluoroscopy was provided for swallowing function study, which was  administered by a speech pathologist.  Final results and recommendations  from this study are contained within the speech pathology report.      Medications: Scheduled Meds:   . aspirin  325 mg Oral Daily  . docusate sodium  100 mg Oral Daily  . enoxaparin (LOVENOX) injection  30 mg Subcutaneous Q24H  . insulin aspart  0-5 Units Subcutaneous QHS  . insulin aspart  0-9 Units  Subcutaneous TID WC  . iron polysaccharides  150 mg Oral Daily  . levETIRAcetam  1,000 mg Oral BID  . nicotine  21 mg Transdermal Daily  . simvastatin  10 mg Oral q1800     LOS: 4 days   Tashera Montalvo 01/29/2012, 5:33 PM  [facility] Pager: [phone]

## 2012-01-29 NOTE — Procedures (Signed)
Modified Barium Swallow Procedure Note Patient Details  Name: [name] MRN: [id number] Date of Birth: 07/14/1941  Today's Date: 01/29/2012 Time:  -     Past Medical History:  Past Medical History  Diagnosis Date  . Diabetes mellitus   . Hypertension   . Stroke    Past Surgical History:  Past Surgical History  Procedure Date  . Coronary artery bypass graft   . Tee without cardioversion 01/28/2012    Procedure: TRANSESOPHAGEAL ECHOCARDIOGRAM (TEE);  Surgeon: Lewayne Bunting, MD;  Location: [facility] ENDOSCOPY;  Service: Cardiovascular;  Laterality: [name];   HPI:  71 yr old admitted with seizure like activity.  MRI revealed a left frontal infarct.  Swallow assessment orderd after pt. with increased drooling and difficulty taking pills with RN. Pt presented with an immediate and delayed cough with thin liquids during the bedside swallow eval. Recommendation was for dys 3 diet with nectar thick liquids. MBS was ordered to assess swallow function.  Recommendation/Prognosis  Clinical Impression: Pt presents with a mild oropharyngeal dysphagia characterized by a mild delay in oral transit as well as a delay in swallow initiation to the valleculae with nectar thick liquids and the pyriform sinuses with thin, secondary to decreased sensation. Despite above, pt was able to adequately protect her airway with no penetration/aspiration observed. Recommend a dys 3 (mechanical soft) diet to facilitate oral transit and thin liquids without straws to decrease the risk of aspiration.  Recommendations: 1. Dys 3 (mechanical soft) and thin liquid 2. Small bites/sips 3. No straws 4. Meds whole in puree 5. SLP to f/u for diet tolerance   SLP Goals  1. Pt will consume recommended diet without observed clinical signs of aspiration with min assist. 2. Pt will utilize recommended strategies during swallow to increase swallowing safety with min assist.  Maxcine Ham, SLP Student  Royce Macadamia 01/29/2012, 2:47 PM

## 2012-01-29 NOTE — Progress Notes (Signed)
Speech Language/Pathology  MBS completed.  Full report to follow Rec:  Continue Dys 3 diet and thin liquids.  Breck Coons [place].Ed [employer] [phone]  01/29/2012

## 2012-01-30 ENCOUNTER — Inpatient Hospital Stay
Admission: RE | Admit: 2012-01-30 | Discharge: 2012-02-20 | Disposition: A | Discharge status: Home / Self Care | Payer: Medicaid Other | Source: Ambulatory Visit | Attending: Internal Medicine | Admitting: Internal Medicine

## 2012-01-30 DIAGNOSIS — D709 Neutropenia, unspecified: Secondary | ICD-10-CM | POA: Diagnosis present

## 2012-01-30 LAB — BASIC METABOLIC PANEL
BUN: 38 mg/dL — ABNORMAL HIGH (ref 6–23)
Creatinine, Ser: 1.29 mg/dL — ABNORMAL HIGH (ref 0.50–1.10)
GFR calc Af Amer: 47 mL/min — ABNORMAL LOW (ref >90)
GFR calc non Af Amer: 41 mL/min — ABNORMAL LOW (ref >90)
Potassium: 4.7 mEq/L (ref 3.5–5.1)

## 2012-01-30 LAB — CBC
HCT: 31.4 % — ABNORMAL LOW (ref 36.0–46.0)
MCHC: 31.2 g/dL (ref 30.0–36.0)
Platelets: 141 10*3/uL — ABNORMAL LOW (ref 150–400)
RDW: 18.4 % — ABNORMAL HIGH (ref 11.5–15.5)

## 2012-01-30 MED ORDER — STARCH (THICKENING) PO POWD: 4536.0000 g | ORAL | Status: DC | PRN

## 2012-01-30 MED ORDER — LEVETIRACETAM 500 MG PO TABS: 500.0000 mg | ORAL_TABLET | Freq: Two times a day (BID) | ORAL | Status: DC

## 2012-01-30 MED ORDER — LEVETIRACETAM 500 MG PO TABS
500.0000 mg | ORAL_TABLET | Freq: Two times a day (BID) | ORAL | Status: DC
  Filled 2012-01-30: qty 1

## 2012-01-30 MED ORDER — NITROFURANTOIN MACROCRYSTAL 100 MG PO CAPS: 100.0000 mg | ORAL_CAPSULE | Freq: Two times a day (BID) | ORAL | Status: DC

## 2012-01-30 NOTE — Progress Notes (Signed)
Patient ID: [name], female   DOB: Mar 05, 1941, 71 y.o.   MRN: [id number]  Assessment/Plan:   Principal Problem:   *ACUTE CVA LEFT FRONTAL LOBE  - Neuro recommended TEE - no evidence of embolism; EF 40 - 45  - HgbA1c: 6.8  - fasting lipid panel: LDL 15 triglycerides 163 currently on a statin.  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  - stroke team following - as per stroke team small incidental aneurysm, patient can continue prophylactic aspirin  - carotid doppler studies negative  - PT consult- recommended SNF  - Prophylactic therapy-Antiplatelet med: Aspirin - dose 325 mg.  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   Active Problems:   NEUTROPENIA - likely secondary to Keppra - consider stopping Keppra and changing to a different antiepileptic  HYPERTENSION - BP 143/82 - considering patient's age this BP is at goal (SBP < 150)   ANEMIA - likely secondary to chronic kidney disease - we will transfuse 1 unit of PRBCs - follow up post transfusion CBC  DIABETES MELLITUS - CBG monitoring  -sliding scale insulin   SEIZURE DISORDER - consider discontinuing Keppra as it may be causing neutropenia - we will follow up with neurology  ACUTE KIDNEY INJURY - creatinine slowly trending down  - continue to monitor while in hospital  - morning labs are still pending; will follow up - normal saline to reverse metabolic acidosis normal anion gap   DVT PROPHYLAXIS - continue lovenox sub Q  EDUCATION  - test results and diagnostic studies were discussed with patient at the bedside  - patient and family have verbalized the understanding  - questions were answered at the bedside and contact information was provided for additional questions or concerns    Subjective: No events overnight. Patient denies  chest pain, shortness of breath, abdominal pain.   Objective:  Vital signs in last 24 hours:   01/29/12 1000 01/29/12 1500 01/29/12 1700 01/29/12 2258  BP: 121/73 154/77 139/102 143/82  Pulse: 75 90 75 88  Temp: 97.3 F (36.3 C) 98.1 F (36.7 C) 98.5 F (36.9 C) 97.6 F (36.4 C)  TempSrc: Oral Oral Oral Oral  Resp: 18 18 17 18   SpO2: 98% 100% 98% 95%    Physical Exam: General:  no acute distress. HEENT: No bruits, no goiter. Moist mucous membranes, no scleral icterus, no conjunctival pallor. Heart: Regular rate and rhythm, S1/S2 +, no murmurs, rubs, gallops. Lungs: Clear to auscultation bilaterally. No wheezing, no rhonchi, no rales.  Abdomen: Soft, nontender, nondistended, positive bowel sounds. Extremities: No clubbing or cyanosis, no pitting edema,  positive pedal pulses. Neuro: Grossly nonfocal.  Lab Results:  Lab 01/30/12 0623 01/29/12 0658 01/25/12 2333 01/25/12 1116  WBC 2.2* 3.4* 3.4* 4.0  HGB 9.8* 11.1* 11.5* 11.8*  HCT 31.4* 34.3* 34.7* 37.2  PLT 141* 157 180 169  MCV 84.2 82.5 81.8 83.2    Lab 01/29/12 0658 01/28/12 0625 01/25/12 1116  NA 137 141 142  K 4.5 4.2 4.3  CL 113* 118* 116*  CO2 15* 16* 17*  GLUCOSE 126* 121* 135*  BUN 44* 56* 73*  CREATININE 1.43* 1.62* 1.77*  CALCIUM 8.2* 9.0 8.6    Medications: Scheduled Meds:   . aspirin  325 mg Oral Daily  . docusate sodium  100 mg Oral Daily  .  enoxaparin (LOVENOX)   30 mg Subcutaneous Q24H  . insulin aspart  0-5 Units Subcutaneous QHS  . insulin aspart  0-9 Units Subcutaneous TID WC  . iron polysaccharides  150 mg Oral Daily  . levETIRAcetam  1,000 mg Oral BID  . nicotine  21 mg Transdermal Daily  . simvastatin  10 mg Oral q1800    LOS: 5 days   Crystalynn Mcinerney 01/30/2012, 7:48 AM  [facility] Pager: [phone]

## 2012-01-30 NOTE — Progress Notes (Signed)
Pt d/c to SNF with PTAR, stroke education explained to daughter earlier, Pt was provided stroke education, pt stable.

## 2012-01-30 NOTE — Progress Notes (Signed)
VASCULAR LAB PRELIMINARY  PRELIMINARY  PRELIMINARY  PRELIMINARY    Preliminary report:  TCD completed  [name] D, RVS 01/30/2012, 1:36 PM

## 2012-01-30 NOTE — Discharge Summary (Addendum)
Patient ID: [name] MRN: [id number] DOB/AGE: 08-04-1941 71 y.o.  Admit date: 01/25/2012 Discharge date: 01/30/2012  Primary Care Physician:  Evlyn Courier, MD, MD  Assessment/Plan:   Principal Problem:   *ACUTE CVA LEFT FRONTAL LOBE  - Neuro recommended TEE - no evidence of embolism; EF 40 - 45  - HgbA1c: 6.8  - fasting lipid panel: LDL 15 triglycerides 163 currently on a statin.  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  - stroke team following - as per stroke team small incidental aneurysm, patient can continue prophylactic aspirin  - carotid doppler studies negative  - PT consult- recommended SNF  - Prophylactic therapy-Antiplatelet med: Aspirin - dose 325 mg.  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   Active Problems:   NEUTROPENIA  - likely secondary to Keppra  - decrease the dosage to 500 mg BID  Possible seizure disorder - EEG done in hospital did not reveal any epileptic activity  - patient was started on too high of a dose of Keppra and as per neurology we will cut down the dose to 500 mg BID. Please monitor CBC in SNF to make sure neutropenia resolves with this changed regimen  HYPERTENSION  - BP 143/82  - considering patient's age this BP is at goal (SBP < 150)   ANEMIA  - likely secondary to chronic kidney disease  - likely also dilutional since IV fluids were started  DIABETES MELLITUS  - CBG monitoring  -sliding scale insulin    ACUTE KIDNEY INJURY  - creatinine slowly trending down; Cr on discharge 1.2 -  metabolic acidosis normal anion gap is secondary to chronic kidney injury  DVT PROPHYLAXIS  - patient was on lovenox sub Q while in hospital  EDUCATION  - test results and diagnostic studies were discussed with patient at the bedside  - patient and family have  verbalized the understanding  - questions were answered at the bedside and contact information was provided for additional questions or concerns      Medication List  As of 01/30/2012 10:41 AM   STOP taking these medications         dipyridamole-aspirin 25-200 MG per 12 hr capsule         TAKE these medications         aspirin 325 MG tablet   Take 325 mg by mouth daily.      docusate sodium 100 MG capsule   Commonly known as: COLACE   Take 100 mg by mouth daily. constipation      food thickener Powd   Commonly known as: THICK IT   Take 4,536 g by mouth as needed.      glyBURIDE-metformin 2.5-500 MG per tablet   Commonly known as: GLUCOVANCE   Take 0.5 tablets by mouth daily with breakfast.      iron polysaccharides 150 MG capsule   Commonly known as: NIFEREX   Take 150 mg by mouth daily.      levETIRAcetam 500 MG tablet   Commonly known as: KEPPRA   Take 1 tablet (500 mg total) by mouth 2 (two) times daily.      metoprolol succinate 100 MG 24 hr tablet   Commonly known as: TOPROL-XL   Take 100 mg by mouth 2 (two) times daily.      nitrofurantoin  100 MG capsule   Commonly known as: MACRODANTIN   Take 1 capsule (100 mg total) by mouth 2 (two) times daily. For 5 days. Will start at home on 3/4      pravastatin 20 MG tablet   Commonly known as: PRAVACHOL   Take 20 mg by mouth daily.            Disposition and Follow-up:  - patient is medically stable to be discharged to SNF today  Consults:   1. STROKE TEAM 2. NEUROLOGY - PHONE CALL IN REGARDS TO NEUTROPENIA (KEPPRA AS POSSIBLE CULPRIT) 3. PHYSICAL THERAPY   Significant Diagnostic Studies:  Dg Chest 2 View 01/25/2012  IMPRESSION: Enlargement of cardiac silhouette post CABG. No acute abnormalities.    Mr Brain Wo Contrast 01/25/2012 IMPRESSION: Sub centimeter focus of acute infarct in the high left frontal lobe.     Brief H and P:   Physical Exam on Discharge:   01/29/12 1000 01/29/12 1500 01/29/12  1700 01/29/12 2258  BP: 121/73 154/77 139/102 143/82  Pulse: 75 90 75 88  Temp: 97.3 F (36.3 C) 98.1 F (36.7 C) 98.5 F (36.9 C) 97.6 F (36.4 C)  TempSrc: Oral Oral Oral Oral  Resp: 18 18 17 18   SpO2: 98% 100% 98% 95%      Gross per 24 hour  Intake    240 ml  Output      0 ml  Net    240 ml    General:  no acute distress. HEENT: No bruits, no goiter. Heart: Regular rate and rhythm, without murmurs, rubs, gallops. Lungs: Clear to auscultation bilaterally. Abdomen: Soft, nontender, nondistended, positive bowel sounds. Extremities: No clubbing cyanosis or edema with positive pedal pulses. Neuro: Grossly intact, nonfocal.  CBC:  WBC 2.2* 01/30/2012 0623   HGB 9.8* 01/30/2012 0623   HCT 31.4* 01/30/2012 0623   PLT 141* 01/30/2012 0623   MCV 84.2 01/30/2012 0623    Basic Metabolic Panel:  NA 145 01/30/2012 0623   K 4.7 01/30/2012 0623   CL 122* 01/30/2012 0623   CO2 16* 01/30/2012 0623   BUN 38* 01/30/2012 0623   CREATININE 1.29* 01/30/2012 0623   GLUCOSE 122* 01/30/2012 0623   CALCIUM 8.7 01/30/2012 0623    Time spent on Discharge: GREATER THAN 45 MINUTES Signed: Lanecia Sliva 01/30/2012, 10:41 AM

## 2012-01-30 NOTE — Progress Notes (Signed)
Speech Language/Pathology  Speech-Cognition and Dysphagia treatment Note   Subjective:    Pt. alert, lying in bed.  Objective:  Observed pt. with cookie and thin grapejuice after MBS yesterday.  Exhibited timely mastication with cookie without residue.  Mild verbal cues required to take smaller sips, one at a time.  No indications of poor airway protection.  No difficulty reported by her RN. Pt. Oriented to day of week only.  She stated she cannot use calendar because "I can't read".  She was not stimulable for naming the months of the year in unison with SLP.  Pt. demonstrated poor problem solving abilities and decreased  anticipatory awareness by attempting to get out of bed to use the restroom.  Pt. Received phone call in which the caller requested clarification x 3.  SLP reviewed compensatory strategies for increased intelligibility.  Impression:   Pt. Appears to be tolerating a Dys 3 (mech soft) diet and thin liquids,  She continues to exhibit cognitive deficits and dysarthria.  Effectiveness of compensatory strategies are limited by pt.'s baseline abilities.  Lungs:  WDL Temp:  97.9  Recommend:  Continue current diet and ST in acute care setting and at next venue of care.  Pain: none  Breck Coons [place].Ed [employer] [phone]  01/30/2012

## 2012-01-30 NOTE — Discharge Instructions (Signed)
Stroke  A stroke (cerebrovascular accident) is the sudden death of brain tissue. It is a medical emergency. A stroke can cause permanent loss of brain function. This can cause problems with different parts of your body. A transient ischemic attack (TIA) is different because it does not cause permanent damage. A TIA is a short-lived problem of poor blood flow affecting a part of the brain. A TIA is also a serious problem because having a TIA greatly increases the chances of having a stroke. When symptoms first develop, you cannot know if the problem might be a stroke or TIA.  CAUSES   A stroke is caused by a decrease of oxygen supply to an area of your brain. It is usually the result of a small blood clot or collection of cholesterol or fat (plaque) that blocks blood flow in the brain. A stroke can also be caused by blocked or damaged carotid arteries. Bleeding in the brain can cause, or accompany, a stroke.  RISK FACTORS   High blood pressure (hypertension).   High cholesterol.   Diabetes.   Heart disease.   The buildup of fatty deposits in the blood vessels (peripheral artery disease or atherosclerosis).   An abnormal heart rhythm (atrial fibrillation).   Obesity.   Smoking.   Taking oral contraceptives (especially in combination with smoking).   Physical inactivity.   A diet high in fats, salt (sodium), and calories.   Alcohol use.   Use of illegal drugs (especially cocaine and methamphetamine).   Being female.   Being African American.   Being over the age of 55.   Family history of stroke.   Previous history of blood clots, a "warning stroke" (transient ischemic attack, TIA), or heart attack.   Sickle cell disease.  SYMPTOMS   These symptoms usually develop suddenly, or may be newly present upon awakening from sleep:   Sudden weakness or numbness of the face, arm, or leg, especially on one side of the body.   Sudden confusion.   Trouble speaking (aphasia) or understanding.   Sudden trouble  seeing in one or both eyes.   Sudden trouble walking.   Dizziness.   Loss of balance or coordination.   Sudden severe headache with no known cause.   Trouble reading or writing.  DIAGNOSIS   Your caregiver can often determine the presence or absence of a stroke based on your symptoms, history, and physical exam. A computerized X-ray scan (CT or CAT scan) of the brain is usually performed to confirm the stroke, to look for causes, and to determine the severity. Other tests may be done to find the cause of the stroke. These tests may include:   An EKG and heart monitoring.   An ultrasound evaluation of the heart (echocardiogram).   An ultrasound evaluation of your carotid arteries.   A computerized magnetic scan (MRI).   A scan of the brain circulation.   Blood oxygen level monitoring.   Blood tests.  PREVENTION   The risk of a stroke can be decreased by appropriately treating high blood pressure, high cholesterol, diabetes, heart disease, and obesity and by quitting smoking, limiting alcohol, and staying physically active.  TREATMENT   Time is of the essence. It is important to seek treatment within 3 to 4 hours of the start of symptoms because you may receive a "clot dissolving" medicine that cannot be given after that time. Even if you do not know when your symptoms began, get treatment as soon as possible. After   the 4 hour window has passed, treatment may include rest, oxygen, intravenous (IV) fluids, and medicines to thin the blood (to prevent another stroke). Treatment of stroke depends on the duration, severity, and cause of your symptoms. Medicines and diet may be used to address diabetes, high blood pressure, and other risk factors. Physical, speech, and occupational therapists will assess you and work to improve any functions impaired by the stroke. Measures will be taken to prevent short-term and long-term complications, including infection from breathing foreign material into the lungs  (aspiration pneumonia), blood clots in the legs, bedsores, and falls. Rarely, surgery may be needed to remove large blood clots or to open up blocked arteries.  HOME CARE INSTRUCTIONS    Medicines: Aspirin and blood thinners may be used to prevent another stroke. Blood thinners need to be used exactly as instructed. Medicines may also be used to control risk factors for a stroke. Be sure you understand all your medicine instructions.   Diet: Certain diets may be prescribed to address high blood pressure, high cholesterol, diabetes, or obesity.   A low-sodium, low-saturated fat, low-trans fat, low-cholesterol diet is recommended to manage high blood pressure.   A low-saturated fat, low-trans fat, low-cholesterol, and high-fiber diet may control cholesterol levels.   A controlled-carbohydrate, controlled-sugar diet is recommended to manage diabetes.   A reduced-calorie, low-sodium, low-saturated fat, low-trans fat, low-cholesterol diet is recommended to manage obesity.   A diet that includes 5 or more servings of fruits and vegetables a day may reduce the risk of stroke. Foods may need to be a certain consistency (soft or pureed), or small bites may need to be taken in order to avoid aspirating or choking.   Maintain a healthy weight.   Stay physically active. It is recommended that you get at least 30 minutes of activity on most or all days.   Do not smoke.   Limit alcohol use. Moderate alcohol use is considered to be:   No more than 2 drinks per day for men.   No more than 1 drink per day for nonpregnant women.   Stop drug abuse.   Home safety: A safe home environment is important to reduce the risk of falls. Your caregiver may arrange for specialists to evaluate your home. Having grab bars in the bedroom and bathroom is often important. Your caregiver may arrange for equipment to be used at home, such as raised toilets and a seat for the shower.   Physical, occupational, and speech therapy: Ongoing  therapy may be needed to maximize your recovery after a stroke. If you have been advised to use a walker or a cane, use it at all times. Be sure to keep your therapy appointments.   Follow all instructions for follow-up with your caregiver. This is very important. This includes any referrals, physical therapy, rehabilitation, and lab tests. Proper treatment also prevents another stroke from occurring.  SEEK IMMEDIATE MEDICAL CARE IF:    You have sudden weakness or numbness of the face, arm, or leg, especially on one side of the body.   You have sudden confusion.   You have trouble speaking (aphasia) or understanding.   You have sudden trouble seeing in one or both eyes.   You have sudden trouble walking.   You have dizziness.   You have a loss of balance or coordination.   You have a sudden, severe headache with no known cause.   You have new chest pain or an irregular heartbeat.  Any of   these symptoms may represent a serious problem that is an emergency. Do not wait to see if the symptoms will go away. Get medical help right away. Call your local emergency services (911 in [place] Do not drive yourself to the hospital.  Document Released: 11/10/2005 Document Revised: 10/30/2011 Document Reviewed: 06/30/2011  [employer] Patient Information 2012 [employer].

## 2012-01-30 NOTE — Progress Notes (Signed)
Pt to d/c to [facility] today by ambulance.  Family is aware and is helping with transition.

## 2012-01-30 NOTE — Progress Notes (Signed)
Agree with student PT note.  [employer], [place] DPT [phone]

## 2012-01-30 NOTE — Progress Notes (Signed)
Physical Therapy Treatment Patient Details Name: CATHERYN SLIFER MRN: [id number] DOB: [birth year] Today's Date: 01/30/2012  PT Assessment/Plan  PT - Assessment/Plan Comments on Treatment Session: Pt demonstrated improved static standing balance today.  Patient unwilling to ambulate.  Patient required increased encouragement in order to participate in physical therapy today. PT Plan: Discharge plan remains appropriate;Frequency remains appropriate PT Frequency: Min 4X/week Follow Up Recommendations: Skilled nursing facility;Supervision/Assistance - 24 hour Equipment Recommended: Defer to next venue PT Goals  Acute Rehab PT Goals PT Goal Formulation: With patient/family Time For Goal Achievement: 7 days Pt will Ambulate: >150 feet;with modified independence;with least restrictive assistive device PT Goal: Ambulate - Progress: Progressing toward goal Additional Goals Additional Goal #2: Patient will be able to score a 32 or greater on the Berg Balance Assessment. PT Goal: Additional Goal #2 - Progress: Goal set today  PT Treatment Precautions/Restrictions  Precautions Precautions: Fall Restrictions Weight Bearing Restrictions: No Mobility (including Balance) Bed Mobility Bed Mobility: Yes Right Sidelying to Sit: 6: Modified independent (Device/Increase time);With rails Transfers Transfers: Yes Sit to Stand: Other (comment);From bed (Min guard) Stand to Sit: 4: Min assist;To chair/3-in-1;With armrests;With upper extremity assist (Min assist to control descent) Ambulation/Gait Ambulation/Gait: Yes Ambulation/Gait Assistance: 5: Supervision Ambulation/Gait Assistance Details (indicate cue type and reason): Pts food arrived during balance assessment and patient unwilling to ambulate further. Ambulation Distance (Feet): 10 Feet Assistive device: None Stairs: No  Static Standing Balance Static Standing - Balance Support: During functional activity Static Standing - Level of  Assistance: 5: Stand by assistance Static Standing - Comment/# of Minutes: ~12 minutes while on phone Berg Balance Test Sit to Stand: Able to stand  independently using hands Standing Unsupported: Able to stand 2 minutes with supervision Sitting with Back Unsupported but Feet Supported on Floor or Stool: Able to sit 2 minutes under supervision Stand to Sit: Controls descent by using hands Transfers: Able to transfer with verbal cueing and /or supervision Standing Unsupported with Eyes Closed: Able to stand 10 seconds with supervision Standing Ubsupported with Feet Together: Able to place feet together independently and stand for 1 minute with supervision Standing Unsupported, Alternately Place Feet on Step/Stool: Needs assistance to keep from falling or unable to try Standing Unsupported, One Foot in Front: Able to take small step independently and hold 30 seconds Standing on One Leg: Unable to try or needs assist to prevent fall Exercise    End of Session PT - End of Session Equipment Utilized During Treatment: Gait belt Activity Tolerance: Patient tolerated treatment well Patient left: in chair;with call bell in reach General Behavior During Session: Flat affect Cognition: Impaired  Ezzard Standing SPT 01/30/2012, 1:17 PM

## 2012-02-02 LAB — GLUCOSE, CAPILLARY

## 2012-02-02 LAB — TYPE AND SCREEN
ABO/RH(D): A POS
Unit division: 0

## 2012-02-03 LAB — GLUCOSE, CAPILLARY: Glucose-Capillary: 159 mg/dL — ABNORMAL HIGH (ref 70–99)

## 2012-02-04 LAB — GLUCOSE, CAPILLARY: Glucose-Capillary: 103 mg/dL — ABNORMAL HIGH (ref 70–99)

## 2012-02-09 LAB — GLUCOSE, CAPILLARY: Glucose-Capillary: 191 mg/dL — ABNORMAL HIGH (ref 70–99)

## 2012-02-11 LAB — GLUCOSE, CAPILLARY: Glucose-Capillary: 90 mg/dL (ref 70–99)

## 2012-02-12 LAB — GLUCOSE, CAPILLARY
Glucose-Capillary: 128 mg/dL — ABNORMAL HIGH (ref 70–99)
Glucose-Capillary: 140 mg/dL — ABNORMAL HIGH (ref 70–99)

## 2012-02-14 LAB — GLUCOSE, CAPILLARY: Glucose-Capillary: 106 mg/dL — ABNORMAL HIGH (ref 70–99)

## 2012-02-15 LAB — GLUCOSE, CAPILLARY: Glucose-Capillary: 139 mg/dL — ABNORMAL HIGH (ref 70–99)

## 2012-02-16 LAB — GLUCOSE, CAPILLARY: Glucose-Capillary: 121 mg/dL — ABNORMAL HIGH (ref 70–99)

## 2012-02-17 LAB — GLUCOSE, CAPILLARY: Glucose-Capillary: 212 mg/dL — ABNORMAL HIGH (ref 70–99)

## 2012-02-18 LAB — GLUCOSE, CAPILLARY
Glucose-Capillary: 159 mg/dL — ABNORMAL HIGH (ref 70–99)
Glucose-Capillary: 198 mg/dL — ABNORMAL HIGH (ref 70–99)
Glucose-Capillary: 155 mg/dL — ABNORMAL HIGH (ref 70–99)
Glucose-Capillary: 131 mg/dL — ABNORMAL HIGH (ref 70–99)

## 2012-02-19 LAB — GLUCOSE, CAPILLARY: Glucose-Capillary: 127 mg/dL — ABNORMAL HIGH (ref 70–99)

## 2012-02-20 LAB — GLUCOSE, CAPILLARY: Glucose-Capillary: 143 mg/dL — ABNORMAL HIGH (ref 70–99)

## 2012-04-19 ENCOUNTER — Emergency Department (HOSPITAL_COMMUNITY): Payer: Medicare Other

## 2012-04-19 ENCOUNTER — Encounter (HOSPITAL_COMMUNITY): Payer: Self-pay

## 2012-04-19 ENCOUNTER — Inpatient Hospital Stay (HOSPITAL_COMMUNITY)
Admission: EM | Admit: 2012-04-19 | Discharge: 2012-04-20 | DRG: 683 | Disposition: A | Discharge status: Home / Self Care | Payer: Medicare Other | Attending: Internal Medicine | Admitting: Internal Medicine

## 2012-04-19 DIAGNOSIS — E860 Dehydration: Secondary | ICD-10-CM | POA: Diagnosis present

## 2012-04-19 DIAGNOSIS — G459 Transient cerebral ischemic attack, unspecified: Secondary | ICD-10-CM

## 2012-04-19 DIAGNOSIS — E1165 Type 2 diabetes mellitus with hyperglycemia: Secondary | ICD-10-CM

## 2012-04-19 DIAGNOSIS — Z8673 Personal history of transient ischemic attack (TIA), and cerebral infarction without residual deficits: Secondary | ICD-10-CM

## 2012-04-19 DIAGNOSIS — IMO0001 Reserved for inherently not codable concepts without codable children: Secondary | ICD-10-CM

## 2012-04-19 DIAGNOSIS — N390 Urinary tract infection, site not specified: Secondary | ICD-10-CM | POA: Diagnosis present

## 2012-04-19 DIAGNOSIS — E119 Type 2 diabetes mellitus without complications: Secondary | ICD-10-CM | POA: Diagnosis present

## 2012-04-19 DIAGNOSIS — I639 Cerebral infarction, unspecified: Secondary | ICD-10-CM | POA: Diagnosis present

## 2012-04-19 DIAGNOSIS — I10 Essential (primary) hypertension: Secondary | ICD-10-CM

## 2012-04-19 DIAGNOSIS — R4789 Other speech disturbances: Secondary | ICD-10-CM | POA: Diagnosis present

## 2012-04-19 DIAGNOSIS — N179 Acute kidney failure, unspecified: Principal | ICD-10-CM | POA: Diagnosis present

## 2012-04-19 DIAGNOSIS — F17200 Nicotine dependence, unspecified, uncomplicated: Secondary | ICD-10-CM | POA: Diagnosis present

## 2012-04-19 DIAGNOSIS — Z951 Presence of aortocoronary bypass graft: Secondary | ICD-10-CM

## 2012-04-19 LAB — DIFFERENTIAL
Basophils Absolute: 0 10*3/uL (ref 0.0–0.1)
Eosinophils Relative: 0 % (ref 0–5)
Lymphocytes Relative: 16 % (ref 12–46)
Lymphs Abs: 1 10*3/uL (ref 0.7–4.0)
Neutro Abs: 4.7 10*3/uL (ref 1.7–7.7)
Neutrophils Relative %: 77 % (ref 43–77)

## 2012-04-19 LAB — CBC
MCV: 85.8 fL (ref 78.0–100.0)
Platelets: 156 10*3/uL (ref 150–400)
RBC: 3.79 MIL/uL — ABNORMAL LOW (ref 3.87–5.11)
RDW: 17 % — ABNORMAL HIGH (ref 11.5–15.5)
WBC: 6.2 10*3/uL (ref 4.0–10.5)

## 2012-04-19 LAB — URINALYSIS, ROUTINE W REFLEX MICROSCOPIC
Bilirubin Urine: NEGATIVE
Glucose, UA: NEGATIVE mg/dL
Protein, ur: 100 mg/dL — AB
Urobilinogen, UA: 0.2 mg/dL (ref 0.0–1.0)

## 2012-04-19 LAB — CARDIAC PANEL(CRET KIN+CKTOT+MB+TROPI)
CK, MB: 3 ng/mL (ref 0.3–4.0)
Total CK: 225 U/L — ABNORMAL HIGH (ref 7–177)
Troponin I: <0.3 ng/mL (ref <0.30)

## 2012-04-19 LAB — PROTIME-INR
INR: 1.02 (ref 0.00–1.49)
Prothrombin Time: 13.6 seconds (ref 11.6–15.2)

## 2012-04-19 LAB — BASIC METABOLIC PANEL
CO2: 12 mEq/L — ABNORMAL LOW (ref 19–32)
Chloride: 119 mEq/L — ABNORMAL HIGH (ref 96–112)
GFR calc Af Amer: 36 mL/min — ABNORMAL LOW (ref >90)
Potassium: 5.3 mEq/L — ABNORMAL HIGH (ref 3.5–5.1)
Sodium: 138 mEq/L (ref 135–145)

## 2012-04-19 LAB — URINE MICROSCOPIC-ADD ON

## 2012-04-19 MED ORDER — CEFTRIAXONE SODIUM 1 G IJ SOLR
1.0000 g | Freq: Once | INTRAMUSCULAR | Status: AC
  Administered 2012-04-19: 1 g via INTRAVENOUS
  Filled 2012-04-19: qty 10

## 2012-04-19 MED ORDER — LEVETIRACETAM 500 MG PO TABS
500.0000 mg | ORAL_TABLET | Freq: Two times a day (BID) | ORAL | Status: DC
  Administered 2012-04-19 – 2012-04-20 (×3): 500 mg via ORAL
  Filled 2012-04-19 (×3): qty 1

## 2012-04-19 MED ORDER — GLYBURIDE-METFORMIN 2.5-500 MG PO TABS: 0.5000 | ORAL_TABLET | Freq: Every day | ORAL | Status: DC

## 2012-04-19 MED ORDER — GLYBURIDE 2.5 MG PO TABS
1.2500 mg | ORAL_TABLET | Freq: Every day | ORAL | Status: DC
  Administered 2012-04-20: 1.25 mg via ORAL
  Filled 2012-04-19 (×2): qty 1

## 2012-04-19 MED ORDER — METFORMIN HCL 500 MG PO TABS: 250.0000 mg | ORAL_TABLET | Freq: Every day | ORAL | Status: DC

## 2012-04-19 MED ORDER — SODIUM CHLORIDE 0.9 % IV SOLN
INTRAVENOUS | Status: DC
  Administered 2012-04-19: 14:00:00 via INTRAVENOUS

## 2012-04-19 MED ORDER — SODIUM CHLORIDE 0.9 % IJ SOLN
3.0000 mL | Freq: Two times a day (BID) | INTRAMUSCULAR | Status: DC
  Administered 2012-04-19 – 2012-04-20 (×2): 3 mL via INTRAVENOUS
  Filled 2012-04-19 (×2): qty 3

## 2012-04-19 MED ORDER — DEXTROSE 5 % IV SOLN
1.0000 g | INTRAVENOUS | Status: DC
  Administered 2012-04-20: 1 g via INTRAVENOUS
  Filled 2012-04-19 (×2): qty 10

## 2012-04-19 MED ORDER — ENALAPRIL MALEATE 5 MG PO TABS
5.0000 mg | ORAL_TABLET | Freq: Two times a day (BID) | ORAL | Status: DC
  Administered 2012-04-19 – 2012-04-20 (×2): 5 mg via ORAL
  Filled 2012-04-19 (×2): qty 1

## 2012-04-19 MED ORDER — HEPARIN SODIUM (PORCINE) 5000 UNIT/ML IJ SOLN
5000.0000 [IU] | Freq: Three times a day (TID) | INTRAMUSCULAR | Status: DC
  Administered 2012-04-19 – 2012-04-20 (×3): 5000 [IU] via SUBCUTANEOUS
  Filled 2012-04-19 (×3): qty 1

## 2012-04-19 MED ORDER — ONDANSETRON HCL 4 MG/2ML IJ SOLN: 4.0000 mg | Freq: Four times a day (QID) | INTRAMUSCULAR | Status: DC | PRN

## 2012-04-19 MED ORDER — ASPIRIN 325 MG PO TABS
325.0000 mg | ORAL_TABLET | Freq: Every day | ORAL | Status: DC
  Administered 2012-04-19 – 2012-04-20 (×2): 325 mg via ORAL
  Filled 2012-04-19 (×2): qty 1

## 2012-04-19 MED ORDER — METOPROLOL SUCCINATE ER 50 MG PO TB24
100.0000 mg | ORAL_TABLET | Freq: Two times a day (BID) | ORAL | Status: DC
  Administered 2012-04-19 – 2012-04-20 (×3): 100 mg via ORAL
  Filled 2012-04-19: qty 1
  Filled 2012-04-19 (×2): qty 2

## 2012-04-19 MED ORDER — ONDANSETRON HCL 4 MG PO TABS: 4.0000 mg | ORAL_TABLET | Freq: Four times a day (QID) | ORAL | Status: DC | PRN

## 2012-04-19 MED ORDER — ASPIRIN-DIPYRIDAMOLE ER 25-200 MG PO CP12
1.0000 | ORAL_CAPSULE | Freq: Two times a day (BID) | ORAL | Status: DC
  Administered 2012-04-20: 1 via ORAL
  Filled 2012-04-19 (×4): qty 1

## 2012-04-19 MED ORDER — SIMVASTATIN 20 MG PO TABS
20.0000 mg | ORAL_TABLET | Freq: Every day | ORAL | Status: DC
  Administered 2012-04-19: 20 mg via ORAL
  Filled 2012-04-19: qty 1

## 2012-04-19 NOTE — ED Provider Notes (Signed)
History  This chart was scribed for Kimberly Octave, MD by Sanjuana Letters Ajibulu. This patient was seen in room APA02/APA02 and the patient's care was started at 9:07AM.  CSN: [id number]  Arrival date & time 04/19/12  5784   First MD Initiated Contact with Patient 04/19/12 [phone]      Chief Complaint  Patient presents with  . Cerebrovascular Accident    stroke like symptoms per family    (Consider location/radiation/quality/duration/timing/severity/associated sxs/prior treatment) The history is provided by the patient and a relative. No language interpreter was used.   [name] is a 71 y.o. female who presents to the Emergency Department complaining of sudden on set, gradually worsening cerebrovascular accident. Pt daughter states that her sister called her at 8:30AM stating that her mother woke up with slurred speech and was ambulating abnormally. Pt's family states that her symptoms seem stroke like and reports that she had a stroke 3 months ago. Pt daughter states that her walking and speech are abnormal and states that last time the pt spoke normally was last night. Pt states that she feels like her speech is normal. Pt's daughter states that her balance has not been the same since her recent stroke and has to ambulate with walker. Pt denies fever, congestion, eye pain, cough, chest pain, vomiting, hematuria, back pain, rash, HA, adenopathy and agitation as associated symptoms. Pt  Has a h/o diabetes, HTN and stroke. Pt is a current everyday smoker but denies a h/o alcohol use.  Past Medical History  Diagnosis Date  . Diabetes mellitus   . Hypertension   . Stroke     Past Surgical History  Procedure Date  . Coronary artery bypass graft   . Tee without cardioversion 01/28/2012    Procedure: TRANSESOPHAGEAL ECHOCARDIOGRAM (TEE);  Surgeon: Lewayne Bunting, MD;  Location: [facility] ENDOSCOPY;  Service: Cardiovascular;  Laterality: [name];    Family History  Problem Relation Age of Onset  .  Stroke Father   . Diabetes type II Mother     History  Substance Use Topics  . Smoking status: Current Everyday Smoker -- 1.0 packs/day for 50 years    Types: Cigarettes  . Smokeless tobacco: Not on file  . Alcohol Use: No    OB History    Grav Para Term Preterm Abortions TAB SAB Ect Mult Living                  Review of Systems  Constitutional: Negative for fever.  HENT: Negative for congestion.   Respiratory: Negative for cough.   Gastrointestinal: Negative for vomiting and diarrhea.  Genitourinary: Negative for dysuria and hematuria.  All other systems reviewed and are negative.    Allergies  Review of patient's allergies indicates no known allergies.  Home Medications   Current Outpatient Rx  Name Route Sig Dispense Refill  . ASPIRIN 325 MG PO TABS Oral Take 325 mg by mouth daily.     . ASPIRIN-DIPYRIDAMOLE ER 25-200 MG PO CP12 Oral Take 1 capsule by mouth 2 (two) times daily.    . ENALAPRIL MALEATE 5 MG PO TABS Oral Take 5 mg by mouth 2 (two) times daily.    . GLYBURIDE-METFORMIN 2.5-500 MG PO TABS Oral Take 0.5 tablets by mouth daily with breakfast.     . LEVETIRACETAM 500 MG PO TABS Oral Take 1 tablet (500 mg total) by mouth 2 (two) times daily. 60 tablet 2  . METOPROLOL SUCCINATE ER 100 MG PO TB24 Oral Take 100 mg by  mouth 2 (two) times daily.     Marland Kitchen PRAVASTATIN SODIUM 20 MG PO TABS Oral Take 20 mg by mouth daily.        Triage Vitals: BP 145/99  Pulse 70  Temp(Src) 97.9 F (36.6 C) (Oral)  Resp 18  Ht 5\' 5"  (1.651 m)  Wt 130 lb (58.968 kg)  BMI 21.63 kg/m2  SpO2 91%  Physical Exam  Nursing note and vitals reviewed. Constitutional: She appears well-developed and well-nourished.  HENT:  Head: Normocephalic and atraumatic.  Nose: Nose normal.  Eyes: Conjunctivae and EOM are normal.  Neck: Normal range of motion. Neck supple.  Cardiovascular: Normal rate, regular rhythm and normal heart sounds.   Pulmonary/Chest: Effort normal and breath sounds  normal.  Abdominal: Soft. Bowel sounds are normal.  Musculoskeletal: Normal range of motion.  Neurological:       Pt has slurred speech and mild left facial droop. Pt's tongue is midline and she has slightly weaker grip strength on the left side. Pt has no pronator drift. Pt has symmetrical leg weakness aganist gravity. Pt has no ataxia finger to nose.  Skin: Skin is warm and dry.  Psychiatric: She has a normal mood and affect. Her behavior is normal.    ED Course  Procedures (including critical care time) DIAGNOSTIC STUDIES: Oxygen Saturation is 91% on room air, adequate by my interpretation.    COORDINATION OF CARE: 9:13AM: Discussed ordering a cat scan ofher head, blood test and MRI to check to see if she had a stroke with pt and pt agreed Labs Reviewed  CBC - Abnormal; Notable for the following:    RBC 3.79 (*)    Hemoglobin 10.3 (*)    HCT 32.5 (*)    RDW 17.0 (*)    All other components within normal limits  CARDIAC PANEL(CRET KIN+CKTOT+MB+TROPI) - Abnormal; Notable for the following:    Total CK 225 (*)    All other components within normal limits  DIFFERENTIAL  PROTIME-INR  BASIC METABOLIC PANEL  URINALYSIS, ROUTINE W REFLEX MICROSCOPIC   Ct Head Wo Contrast  04/19/2012  *RADIOLOGY REPORT*  Clinical Data: Slurred speech.  Discoordination.  Hypertension diabetes.  Possible stroke.  CT HEAD WITHOUT CONTRAST  Technique:  Contiguous axial images were obtained from the base of the skull through the vertex without contrast.  Comparison: 01/25/2012  Findings: There is no identifiable acute infarction.  No hemorrhage.  The patient has an old infarction affecting the right cerebellum.  There are old lacunar infarctions in the basal ganglia and thalami.  There is an old right occipital cortical infarction. Chronic small vessel changes effect the white matter.  No hydrocephalus, mass lesion or extra-axial collection.  The calvarium is unremarkable.  Sinuses are clear.  IMPRESSION: No  identifiable acute insult.  Extensive old ischemic changes throughout the brain as outlined above.  Original Report Authenticated By: Thomasenia Sales, M.D.   Dg Chest Port 1 View  04/19/2012  *RADIOLOGY REPORT*  Clinical Data: Altered mental status.  Hypertension.  Diabetes.  PORTABLE CHEST - 1 VIEW  Comparison: 01/25/2012  Findings: Artifact overlies chest.  There is been previous median sternotomy and CABG.  The patient has taken a poor inspiration. Allowing for that, the lungs are probably clear.  No evidence of edema or effusion.  IMPRESSION: Poor inspiration.  Previous CABG.  No active disease identified.  Original Report Authenticated By: Thomasenia Sales, M.D.     No diagnosis found.    MDM  History of stroke  and TIA presenting with slurred speech, difficulty walking left upper extremity weakness. Last known normal last night. Patient thinks she said her baseline per her family thinks her speech is worse. Given the time course of her presentation as well as the minor nature of her symptoms, she is not a candidate for thrombolytics  EKG nonischemic. Labs unremarkable. CT head without acute infarct. Unable to obtain an MRI today. Patient with extensive cerebrovascular history already on Aggrenox.  Family continues to state patient is not at her baseline has more difficulty with speech and walking are normal. Will admit for further workup including MRI brain to rule out new stroke. D/w Dr. Karilyn Cota.   Date: 04/19/2012  Rate: 73  Rhythm: normal sinus rhythm and premature atrial contractions (PAC)  QRS Axis: normal  Intervals: PR prolonged  ST/T Wave abnormalities: nonspecific ST/T changes  Conduction Disutrbances:first-degree A-V block   Narrative Interpretation:   Old EKG Reviewed: unchanged     I personally performed the services described in this documentation, which was scribed in my presence.  The recorded information has been reviewed and considered.    Kimberly Octave,  MD 04/19/12 [phone]

## 2012-04-19 NOTE — ED Notes (Signed)
Daughter here with pt states, " My sister called by about 8:30 this morning and said momma woke up slurring her words and falling around" speech slow.

## 2012-04-19 NOTE — H&P (Addendum)
[name] MRN: [id number] DOB/AGE: Mar 29, 1941 71 y.o. Primary Care Physician:HILL,GERALD K, MD, MD Admit date: 04/19/2012 Chief Complaint: Slurred speech, possible worsening of gait. HPI: This 71 year old lady, who has had multiple strokes in the past, the most recent one in March, presents with the above symptoms. Interestingly, there was no concern from the family and the patient herself who thinks that she did not have slurred speech. Nonetheless the emergency room physician feels that she may have had a stroke and now she is being admitted for the same. MRI brain scan could not be done because of the holiday season.  Past Medical History  Diagnosis Date  . Diabetes mellitus   . Hypertension   . Stroke    Past Surgical History  Procedure Date  . Coronary artery bypass graft   . Tee without cardioversion 01/28/2012    Procedure: TRANSESOPHAGEAL ECHOCARDIOGRAM (TEE);  Surgeon: Lewayne Bunting, MD;  Location: [facility] ENDOSCOPY;  Service: Cardiovascular;  Laterality: [name];        Family History  Problem Relation Age of Onset  . Stroke Father   . Diabetes type II Mother     Social History:  reports that she has been smoking Cigarettes.  She has a 50 pack-year smoking history. She does not have any smokeless tobacco history on file. She reports that she does not drink alcohol or use illicit drugs.   Allergies: No Known Allergies       HYQ:MVHQI from the symptoms mentioned above,there are no other symptoms referable to all systems reviewed.  Physical Exam: Blood pressure 139/66, pulse 56, temperature 97.9 F (36.6 C), temperature source Oral, resp. rate 15, height 5\' 5"  (1.651 m), weight 58.968 kg (130 lb), SpO2 98.00%. She looks systemically well, somewhat clinically dehydrated. Heart sounds are present and in sinus rhythm. Lung fields are clear. Abdomen soft nontender. She is alert and orientated. Her speech is slightly slurred. She does not appear to have any facial weakness.  She is able to move all her arms and legs.    Basename 04/19/12 1001  WBC 6.2  NEUTROABS 4.7  HGB 10.3*  HCT 32.5*  MCV 85.8  PLT 156    Basename 04/19/12 1001  NA 138  K 5.3*  CL 119*  CO2 12*  GLUCOSE 130*  BUN 52*  CREATININE 1.61*  CALCIUM 9.3  MG --         Ct Head Wo Contrast  04/19/2012  *RADIOLOGY REPORT*  Clinical Data: Slurred speech.  Discoordination.  Hypertension diabetes.  Possible stroke.  CT HEAD WITHOUT CONTRAST  Technique:  Contiguous axial images were obtained from the base of the skull through the vertex without contrast.  Comparison: 01/25/2012  Findings: There is no identifiable acute infarction.  No hemorrhage.  The patient has an old infarction affecting the right cerebellum.  There are old lacunar infarctions in the basal ganglia and thalami.  There is an old right occipital cortical infarction. Chronic small vessel changes effect the white matter.  No hydrocephalus, mass lesion or extra-axial collection.  The calvarium is unremarkable.  Sinuses are clear.  IMPRESSION: No identifiable acute insult.  Extensive old ischemic changes throughout the brain as outlined above.  Original Report Authenticated By: Thomasenia Sales, M.D.   Dg Chest Port 1 View  04/19/2012  *RADIOLOGY REPORT*  Clinical Data: Altered mental status.  Hypertension.  Diabetes.  PORTABLE CHEST - 1 VIEW  Comparison: 01/25/2012  Findings: Artifact overlies chest.  There is been previous median sternotomy and  CABG.  The patient has taken a poor inspiration. Allowing for that, the lungs are probably clear.  No evidence of edema or effusion.  IMPRESSION: Poor inspiration.  Previous CABG.  No active disease identified.  Original Report Authenticated By: Thomasenia Sales, M.D.   Impression: 1. Possible new CVA. 2. 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 3. Hypertension. 4. Type 2 diabetes mellitus. 5. Dehydration/acute renal failure. 6. Probable UTI.     Plan: 1. Admit to telemetry. 2. MRI  brain scan, tomorrow. 3. Check lipid panel. 4. IV fluids. 5. Intravenous antibiotics for UTI. Further recommendations will depend on patient's hospital progress.      Wilson Singer Pager [phone]  04/19/2012, 11:57 AM

## 2012-04-19 NOTE — ED Notes (Signed)
EKG completed with triage. Patient on bedside monitor. Family at bedside. No needs voiced from patient at this time.

## 2012-04-20 ENCOUNTER — Inpatient Hospital Stay (HOSPITAL_COMMUNITY): Payer: Medicare Other

## 2012-04-20 DIAGNOSIS — IMO0001 Reserved for inherently not codable concepts without codable children: Secondary | ICD-10-CM

## 2012-04-20 DIAGNOSIS — G459 Transient cerebral ischemic attack, unspecified: Secondary | ICD-10-CM

## 2012-04-20 DIAGNOSIS — E1165 Type 2 diabetes mellitus with hyperglycemia: Secondary | ICD-10-CM

## 2012-04-20 DIAGNOSIS — I10 Essential (primary) hypertension: Secondary | ICD-10-CM

## 2012-04-20 DIAGNOSIS — E860 Dehydration: Secondary | ICD-10-CM

## 2012-04-20 LAB — LIPID PANEL
Cholesterol: 164 mg/dL (ref 0–200)
HDL: 57 mg/dL (ref >39)
Total CHOL/HDL Ratio: 2.9 RATIO

## 2012-04-20 LAB — CBC
MCV: 85 fL (ref 78.0–100.0)
Platelets: 156 10*3/uL (ref 150–400)
RBC: 3.6 MIL/uL — ABNORMAL LOW (ref 3.87–5.11)
WBC: 2.8 10*3/uL — ABNORMAL LOW (ref 4.0–10.5)

## 2012-04-20 LAB — COMPREHENSIVE METABOLIC PANEL
Albumin: 2.8 g/dL — ABNORMAL LOW (ref 3.5–5.2)
BUN: 43 mg/dL — ABNORMAL HIGH (ref 6–23)
Calcium: 8.8 mg/dL (ref 8.4–10.5)
Chloride: 122 mEq/L — ABNORMAL HIGH (ref 96–112)
Creatinine, Ser: 1.45 mg/dL — ABNORMAL HIGH (ref 0.50–1.10)
Total Bilirubin: 0.1 mg/dL — ABNORMAL LOW (ref 0.3–1.2)
Total Protein: 8.3 g/dL (ref 6.0–8.3)

## 2012-04-20 MED ORDER — SODIUM CHLORIDE 0.9 % IJ SOLN
INTRAMUSCULAR | Status: AC
  Administered 2012-04-20: 3 mL via INTRAVENOUS
  Filled 2012-04-20: qty 3

## 2012-04-20 NOTE — Progress Notes (Signed)
Subjective: This lady is feeling better today. She looks better hydrated. She still has not had her MRI brain scan yet.           Physical Exam: Blood pressure 131/77, pulse 67, temperature 98.1 F (36.7 C), temperature source Oral, resp. rate 18, height 5\' 4"  (1.626 m), weight 59.7 kg (131 lb 9.8 oz), SpO2 93.00%. She looks better hydrated. Heart sounds are present and normal. Lung fields are clear. There are still no significant focal neurological signs.       Basic Metabolic Panel:  Basename 04/20/12 0617 04/19/12 1001  NA 141 138  K 4.6 5.3*  CL 122* 119*  CO2 14* 12*  GLUCOSE 107* 130*  BUN 43* 52*  CREATININE 1.45* 1.61*  CALCIUM 8.8 9.3  MG -- --  PHOS -- --   Liver Function Tests:  [facility] 04/20/12 0617  AST 24  ALT 15  ALKPHOS 42  BILITOT 0.1*  PROT 8.3  ALBUMIN 2.8*     CBC:  Basename 04/20/12 0617 04/19/12 1001  WBC 2.8* 6.2  NEUTROABS -- 4.7  HGB 9.9* 10.3*  HCT 30.6* 32.5*  MCV 85.0 85.8  PLT 156 156    Ct Head Wo Contrast  04/19/2012  *RADIOLOGY REPORT*  Clinical Data: Slurred speech.  Discoordination.  Hypertension diabetes.  Possible stroke.  CT HEAD WITHOUT CONTRAST  Technique:  Contiguous axial images were obtained from the base of the skull through the vertex without contrast.  Comparison: 01/25/2012  Findings: There is no identifiable acute infarction.  No hemorrhage.  The patient has an old infarction affecting the right cerebellum.  There are old lacunar infarctions in the basal ganglia and thalami.  There is an old right occipital cortical infarction. Chronic small vessel changes effect the white matter.  No hydrocephalus, mass lesion or extra-axial collection.  The calvarium is unremarkable.  Sinuses are clear.  IMPRESSION: No identifiable acute insult.  Extensive old ischemic changes throughout the brain as outlined above.  Original Report Authenticated By: Thomasenia Sales, M.D.   Dg Chest Port 1 View  04/19/2012  *RADIOLOGY  REPORT*  Clinical Data: Altered mental status.  Hypertension.  Diabetes.  PORTABLE CHEST - 1 VIEW  Comparison: 01/25/2012  Findings: Artifact overlies chest.  There is been previous median sternotomy and CABG.  The patient has taken a poor inspiration. Allowing for that, the lungs are probably clear.  No evidence of edema or effusion.  IMPRESSION: Poor inspiration.  Previous CABG.  No active disease identified.  Original Report Authenticated By: Thomasenia Sales, M.D.      Medications:  Scheduled:   . aspirin  325 mg Oral Daily  . cefTRIAXone (ROCEPHIN)  IV  1 g Intravenous Once  . cefTRIAXone (ROCEPHIN)  IV  1 g Intravenous Q24H  . dipyridamole-aspirin  1 capsule Oral BID  . enalapril  5 mg Oral BID  . glyBURIDE  1.25 mg Oral Q breakfast  . heparin  5,000 Units Subcutaneous Q8H  . levETIRAcetam  500 mg Oral BID  . metoprolol succinate  100 mg Oral BID  . simvastatin  20 mg Oral q1800  . sodium chloride  3 mL Intravenous Q12H  . sodium chloride      . DISCONTD: glyBURIDE-metformin  0.5 tablet Oral Q breakfast  . DISCONTD: metFORMIN  250 mg Oral Q breakfast    Impression: 1. Possible CVA. 2. Hypertension. 3. Type 2 diabetes mellitus. 3. Acute renal failure secondary to dehydration, improving.     Plan: 1.  Increase intravenous fluids to 100 cc an hour. 2. Await MRI brain for further recommendations.     LOS: 1 day   Wilson Singer Pager [phone]  04/20/2012, 8:39 AM

## 2012-04-20 NOTE — Discharge Summary (Signed)
Physician Discharge Summary  Patient ID: [name] MRN: [id number] DOB/AGE: 06/28/1941 71 y.o. Primary Care Physician:HILL,GERALD K, MD, MD Admit date: 04/19/2012 Discharge date: 04/20/2012    Discharge Diagnoses:  1. Dehydration, improving. 2. No evidence of new CVA. 3. Hypertension. 4. Diabetes mellitus.   Medication List  As of 04/20/2012 11:52 AM   STOP taking these medications         aspirin 325 MG tablet         TAKE these medications         dipyridamole-aspirin 25-200 MG per 12 hr capsule   Commonly known as: AGGRENOX   Take 1 capsule by mouth 2 (two) times daily.      enalapril 5 MG tablet   Commonly known as: VASOTEC   Take 5 mg by mouth 2 (two) times daily.      glyBURIDE-metformin 2.5-500 MG per tablet   Commonly known as: GLUCOVANCE   Take 0.5 tablets by mouth daily with breakfast.      levETIRAcetam 500 MG tablet   Commonly known as: KEPPRA   Take 1 tablet (500 mg total) by mouth 2 (two) times daily.      metoprolol succinate 100 MG 24 hr tablet   Commonly known as: TOPROL-XL   Take 100 mg by mouth 2 (two) times daily.      pravastatin 20 MG tablet   Commonly known as: PRAVACHOL   Take 20 mg by mouth daily.            Discharged Condition: Stable and improved.    Consults: None.  Significant Diagnostic Studies: Ct Head Wo Contrast  04/19/2012  *RADIOLOGY REPORT*  Clinical Data: Slurred speech.  Discoordination.  Hypertension diabetes.  Possible stroke.  CT HEAD WITHOUT CONTRAST  Technique:  Contiguous axial images were obtained from the base of the skull through the vertex without contrast.  Comparison: 01/25/2012  Findings: There is no identifiable acute infarction.  No hemorrhage.  The patient has an old infarction affecting the right cerebellum.  There are old lacunar infarctions in the basal ganglia and thalami.  There is an old right occipital cortical infarction. Chronic small vessel changes effect the white matter.  No  hydrocephalus, mass lesion or extra-axial collection.  The calvarium is unremarkable.  Sinuses are clear.  IMPRESSION: No identifiable acute insult.  Extensive old ischemic changes throughout the brain as outlined above.  Original Report Authenticated By: Thomasenia Sales, M.D.   Mr Brain Wo Contrast  04/20/2012  *RADIOLOGY REPORT*  Clinical Data: Diabetic hypertensive with slurred speech and worsening gait.  Question new infarct?  MRI HEAD WITHOUT CONTRAST  Technique:  Multiplanar, multiecho pulse sequences of the brain and surrounding structures were obtained according to standard protocol without intravenous contrast.  Comparison: 04/19/2012 head CT. 01/25/2012 brain MR.  MR angiogram circle Willis 01/27/2012.  Findings: No acute infarct.  Remote partially hemorrhagic infarct left mid corona radiata/superior left lenticular nucleus with mild dilation of the adjacent left lateral ventricle unchanged. No other areas of intracranial hemorrhage.  Remote right occipital lobe infarct with encephalomalacia.  Remote small basal ganglia infarcts bilaterally and remote small left thalamic infarct.  Small vessel disease type changes.  Global atrophy without hydrocephalus.  No intracranial mass lesion detected on this unenhanced exam.  Intracranial atherosclerotic type changes noted although not as well delineated as on the prior MR angiogram. Left internal carotid artery aneurysm  not assessed.  Please see prior MR angiogram report.  Exophthalmos.  Ethmoid sinus air  cell mucosal thickening.  Spinal stenosis C3-4 slight cord flattening.  IMPRESSION: No acute infarct.  Remote infarcts and small vessel disease type changes as detailed above.  Please see above.  Original Report Authenticated By: Fuller Canada, M.D.   Dg Chest Port 1 View  04/19/2012  *RADIOLOGY REPORT*  Clinical Data: Altered mental status.  Hypertension.  Diabetes.  PORTABLE CHEST - 1 VIEW  Comparison: 01/25/2012  Findings: Artifact overlies chest.   There is been previous median sternotomy and CABG.  The patient has taken a poor inspiration. Allowing for that, the lungs are probably clear.  No evidence of edema or effusion.  IMPRESSION: Poor inspiration.  Previous CABG.  No active disease identified.  Original Report Authenticated By: Thomasenia Sales, M.D.    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 The family were concerned that the patient might be having another stroke. When she was evaluated in the emergency room, she was felt to be clinically dehydrated and in fact her lab work supported this. CT scan of her brain was unremarkable in the emergency room. She was therefore admitted, given intravenous fluids for hydration. MRI brain scan was done this morning and this does not show an acute  CVA. The patient feels better with hydration and her renal function has also improved from her lab work.  Discharge Exam: Blood pressure 131/77, pulse 67, temperature 98.1 F (36.7 C), temperature source Oral, resp. rate 18, height 5\' 4"  (1.626 m), weight 59.7 kg (131 lb 9.8 oz), SpO2 93.00%. She looks systemically well. Heart sounds are present and normal. Lung fields are clear. She is alert and orientated and there are no new focal neurological signs.  Disposition: Home. She'll continue with Aggrenox as before. She has been advised to be well  hydrated.  Discharge Orders    Future Orders Please Complete By Expires   Diet - low sodium heart healthy      Increase activity slowly           SignedWilson Singer Pager [phone]  04/20/2012, 11:52 AM

## 2012-04-20 NOTE — Progress Notes (Signed)
Discharge instructions given to patient's daughter, verbalized understanding, out in stable condition via w/c with staff. 

## 2012-04-26 NOTE — Care Management Note (Signed)
    Page 1 of 1   04/26/2012     4:05:51 PM   CARE MANAGEMENT NOTE 04/26/2012  Patient:  [name], [name]   Account Number:  [id number]  Date Initiated:  04/26/2012  Documentation initiated by:  Anibal Henderson  Subjective/Objective Assessment:   Pt admitted with ? TIA, slurred speech, but states she is fine now. She lives at home with family and plans to return, hopefully today.     Action/Plan:   No needs identified   Anticipated DC Date:  04/20/2012   Anticipated DC Plan:  HOME/SELF CARE      DC Planning Services  CM consult      Choice offered to / List presented to:             Status of service:  Completed, signed off Medicare Important Message given?   (If response is "NO", the following Medicare IM given date fields will be blank) Date Medicare IM given:   Date Additional Medicare IM given:    Discharge Disposition:  HOME/SELF CARE  Per UR Regulation:    If discussed at Long Length of Stay Meetings, dates discussed:    Comments:  delayed entry 04/26/12 1600 Anibal Henderson RN

## 2012-06-22 NOTE — Progress Notes (Signed)
UR Chart Review Completed  

## 2012-09-03 ENCOUNTER — Emergency Department (HOSPITAL_COMMUNITY): Payer: Medicare Other

## 2012-09-03 ENCOUNTER — Encounter (HOSPITAL_COMMUNITY): Payer: Self-pay | Admitting: *Deleted

## 2012-09-03 ENCOUNTER — Emergency Department (HOSPITAL_COMMUNITY)
Admission: EM | Admit: 2012-09-03 | Discharge: 2012-09-03 | Disposition: A | Discharge status: Home / Self Care | Payer: Medicare Other | Attending: Emergency Medicine | Admitting: Emergency Medicine

## 2012-09-03 DIAGNOSIS — Z8673 Personal history of transient ischemic attack (TIA), and cerebral infarction without residual deficits: Secondary | ICD-10-CM | POA: Insufficient documentation

## 2012-09-03 DIAGNOSIS — E119 Type 2 diabetes mellitus without complications: Secondary | ICD-10-CM | POA: Insufficient documentation

## 2012-09-03 DIAGNOSIS — N289 Disorder of kidney and ureter, unspecified: Secondary | ICD-10-CM | POA: Insufficient documentation

## 2012-09-03 DIAGNOSIS — R5381 Other malaise: Secondary | ICD-10-CM | POA: Insufficient documentation

## 2012-09-03 DIAGNOSIS — R269 Unspecified abnormalities of gait and mobility: Secondary | ICD-10-CM | POA: Insufficient documentation

## 2012-09-03 DIAGNOSIS — R531 Weakness: Secondary | ICD-10-CM

## 2012-09-03 DIAGNOSIS — Z79899 Other long term (current) drug therapy: Secondary | ICD-10-CM | POA: Insufficient documentation

## 2012-09-03 DIAGNOSIS — H5789 Other specified disorders of eye and adnexa: Secondary | ICD-10-CM | POA: Insufficient documentation

## 2012-09-03 DIAGNOSIS — M7989 Other specified soft tissue disorders: Secondary | ICD-10-CM | POA: Insufficient documentation

## 2012-09-03 DIAGNOSIS — E872 Acidosis, unspecified: Secondary | ICD-10-CM | POA: Insufficient documentation

## 2012-09-03 DIAGNOSIS — H499 Unspecified paralytic strabismus: Secondary | ICD-10-CM

## 2012-09-03 DIAGNOSIS — H519 Unspecified disorder of binocular movement: Secondary | ICD-10-CM | POA: Insufficient documentation

## 2012-09-03 DIAGNOSIS — I252 Old myocardial infarction: Secondary | ICD-10-CM | POA: Insufficient documentation

## 2012-09-03 DIAGNOSIS — D649 Anemia, unspecified: Secondary | ICD-10-CM

## 2012-09-03 DIAGNOSIS — H4910 Fourth [trochlear] nerve palsy, unspecified eye: Secondary | ICD-10-CM | POA: Insufficient documentation

## 2012-09-03 DIAGNOSIS — E878 Other disorders of electrolyte and fluid balance, not elsewhere classified: Secondary | ICD-10-CM | POA: Insufficient documentation

## 2012-09-03 DIAGNOSIS — IMO0001 Reserved for inherently not codable concepts without codable children: Secondary | ICD-10-CM

## 2012-09-03 DIAGNOSIS — I10 Essential (primary) hypertension: Secondary | ICD-10-CM | POA: Insufficient documentation

## 2012-09-03 HISTORY — DX: Old myocardial infarction: I25.2

## 2012-09-03 LAB — URINALYSIS, ROUTINE W REFLEX MICROSCOPIC
Bilirubin Urine: NEGATIVE
Glucose, UA: NEGATIVE mg/dL
Ketones, ur: NEGATIVE mg/dL
Nitrite: NEGATIVE
Protein, ur: 100 mg/dL — AB
Specific Gravity, Urine: 1.02 (ref 1.005–1.030)
Urobilinogen, UA: 0.2 mg/dL (ref 0.0–1.0)
pH: 7.5 (ref 5.0–8.0)

## 2012-09-03 LAB — BASIC METABOLIC PANEL
Calcium: 9.1 mg/dL (ref 8.4–10.5)
GFR calc Af Amer: 39 mL/min — ABNORMAL LOW (ref >90)
GFR calc non Af Amer: 34 mL/min — ABNORMAL LOW (ref >90)
Sodium: 139 mEq/L (ref 135–145)

## 2012-09-03 LAB — CBC WITH DIFFERENTIAL/PLATELET
Basophils Absolute: 0 10*3/uL (ref 0.0–0.1)
Basophils Relative: 1 % (ref 0–1)
Eosinophils Absolute: 0.1 10*3/uL (ref 0.0–0.7)
Eosinophils Relative: 2 % (ref 0–5)
HCT: 31.9 % — ABNORMAL LOW (ref 36.0–46.0)
Hemoglobin: 10.2 g/dL — ABNORMAL LOW (ref 12.0–15.0)
Lymphocytes Relative: 22 % (ref 12–46)
Lymphs Abs: 0.9 10*3/uL (ref 0.7–4.0)
MCH: 28.3 pg (ref 26.0–34.0)
MCHC: 32 g/dL (ref 30.0–36.0)
MCV: 88.6 fL (ref 78.0–100.0)
Monocytes Absolute: 0.5 10*3/uL (ref 0.1–1.0)
Monocytes Relative: 12 % (ref 3–12)
Neutro Abs: 2.5 10*3/uL (ref 1.7–7.7)
Neutrophils Relative %: 63 % (ref 43–77)
Platelets: 219 10*3/uL (ref 150–400)
RBC: 3.6 MIL/uL — ABNORMAL LOW (ref 3.87–5.11)
RDW: 15.3 % (ref 11.5–15.5)
WBC: 4 10*3/uL (ref 4.0–10.5)

## 2012-09-03 LAB — TROPONIN I: Troponin I: <0.3 ng/mL (ref <0.30)

## 2012-09-03 LAB — URINE MICROSCOPIC-ADD ON

## 2012-09-03 MED ORDER — SODIUM CHLORIDE 0.9 % IV BOLUS (SEPSIS)
750.0000 mL | Freq: Once | INTRAVENOUS | Status: AC
  Administered 2012-09-03: 750 mL via INTRAVENOUS

## 2012-09-03 MED ORDER — GADOBENATE DIMEGLUMINE 529 MG/ML IV SOLN
7.0000 mL | Freq: Once | INTRAVENOUS | Status: AC | PRN
  Administered 2012-09-03: 7 mL via INTRAVENOUS

## 2012-09-03 MED ORDER — SODIUM CHLORIDE 0.9 % IV SOLN: INTRAVENOUS | Status: DC

## 2012-09-03 NOTE — ED Notes (Signed)
Ambulated in hall, tolerated activity well with assistance, has walker at home that she uses as needed

## 2012-09-03 NOTE — ED Notes (Signed)
Placed on cardiac monitor.  EKG completed by nurse tech.  Per pt, states she "just doesn't feel right" since waking this morning.  Per family member, pt with unsteady gait.  Pt denies pain.  A/Ox4; answers questions appropriately.

## 2012-09-03 NOTE — ED Notes (Signed)
Patient transported to MRI. [name]

## 2012-09-03 NOTE — ED Provider Notes (Signed)
History  This chart was scribed for Ward Givens, MD by Erskine Emery. This patient was seen in room APA16A/APA16A and the patient's care was started at 15:06.   CSN: [id number]  Arrival date & time 09/03/12  1208   First MD Initiated Contact with Patient 09/03/12 1506      No chief complaint on file.   (Consider location/radiation/quality/duration/timing/severity/associated sxs/prior treatment) The history is provided by the patient. No language interpreter was used.  [name] is a 71 y.o. female who presents to the Emergency Department complaining of left eye swollen when she got up this morning ,  and an abnormal staggering gait, dragging the right leg, since this morning. Pt reports she does have a walker for "staggering" but that she only noticed her symptoms this morning. Pt denies any associated numbness or weakness in her right arm, difficulty speaking, headache, pain or numbness in legs, nausea, appetite change, chest pain, or SOB. She has had a normal appetite. Pt has a h/o stroke but cannot recall any symptoms from that episode. Pt also reports a large swelling on the right shoulder that has been present for about 2-3 months.  Dr. Mirna Mires in [place] is the pt's PCP  Past Medical History  Diagnosis Date  . Diabetes mellitus   . Hypertension   . Stroke   . MI, old     Past Surgical History  Procedure Date  . Coronary artery bypass graft   . Tee without cardioversion 01/28/2012    Procedure: TRANSESOPHAGEAL ECHOCARDIOGRAM (TEE);  Surgeon: Lewayne Bunting, MD;  Location: [facility] ENDOSCOPY;  Service: Cardiovascular;  Laterality: [name];    Family History  Problem Relation Age of Onset  . Stroke Father   . Diabetes type II Mother     History  Substance Use Topics  . Smoking status: Former Smoker -- 1.0 packs/day for 50 years    Types: Cigarettes  . Smokeless tobacco: Not on file  . Alcohol Use: No    Pt lives with her daughter   Pt reports she quit smoking  about 5-6 months ago after 50 years.  Pt does not drink. Pt uses a walker "for stumbling walking"  OB History    Grav Para Term Preterm Abortions TAB SAB Ect Mult Living                  Review of Systems  Constitutional: Negative for fever and chills.  HENT: Negative for congestion.   Eyes: Positive for pain and visual disturbance.  Respiratory: Negative for shortness of breath.   Gastrointestinal: Negative for nausea and vomiting.  Genitourinary: Negative for hematuria.  Musculoskeletal:       Abnormal gait  Skin: Negative for rash.  Neurological: Negative for weakness and headaches.  All other systems reviewed and are negative.    Allergies  Review of patient's allergies indicates no known allergies.  Home Medications   Current Outpatient Rx  Name Route Sig Dispense Refill  . ASPIRIN-DIPYRIDAMOLE ER 25-200 MG PO CP12 Oral Take 1 capsule by mouth 2 (two) times daily.    . ENALAPRIL MALEATE 5 MG PO TABS Oral Take 5 mg by mouth 2 (two) times daily.    . GLYBURIDE-METFORMIN 2.5-500 MG PO TABS Oral Take 0.5 tablets by mouth daily with breakfast.     . LEVETIRACETAM 500 MG PO TABS Oral Take 1 tablet (500 mg total) by mouth 2 (two) times daily. 60 tablet 2  . METOPROLOL SUCCINATE ER 100 MG PO TB24  Oral Take 100 mg by mouth 2 (two) times daily.     Marland Kitchen PRAVASTATIN SODIUM 20 MG PO TABS Oral Take 20 mg by mouth daily.        Triage Vitals: BP 151/62  Pulse 66  Temp 98.3 F (36.8 C) (Oral)  Resp 18  Ht 5\' 4"  (1.626 m)  Wt 145 lb (65.772 kg)  BMI 24.89 kg/m2  SpO2 100%  Vital signs normal mild hypertension   Physical Exam  Nursing note and vitals reviewed. Constitutional: She appears well-developed and well-nourished.  Non-toxic appearance. She does not appear ill. No distress.       Follows directions well.  HENT:  Head: Normocephalic and atraumatic.  Right Ear: External ear normal.  Left Ear: External ear normal.  Nose: Nose normal. No mucosal edema or rhinorrhea.    Mouth/Throat: Oropharynx is clear and moist and mucous membranes are normal. No dental abscesses or uvula swelling.  Eyes: Conjunctivae normal are normal. Pupils are equal, round, and reactive to light.       No swelling noted to her eyelids. 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  Neck: Normal range of motion and full passive range of motion without pain. Neck supple.  Cardiovascular: Normal rate, regular rhythm and normal heart sounds.  Exam reveals no gallop and no friction rub.   No murmur heard. Pulmonary/Chest: Effort normal and breath sounds normal. No respiratory distress. She has no wheezes. She has no rhonchi. She has no rales. She exhibits no tenderness and no crepitus.  Abdominal: Soft. Normal appearance and bowel sounds are normal. She exhibits no distension. There is no tenderness. There is no rebound and no guarding.  Musculoskeletal: Normal range of motion. She exhibits no edema and no tenderness.       A large swelling about the size of half of a softball, anterior to right shoulder that feels like a fatty tumor.  Neurological: She is alert. She has normal strength. No cranial nerve deficit. Coordination normal.       Speech is slow. Mentation is slow. No pronator drift. Motor is equal. Equal grips. No cranial nerve deficits.  Skin: Skin is warm, dry and intact. No rash noted. No erythema. No pallor.  Psychiatric: She has a normal mood and affect. Her speech is normal and behavior is normal. Her mood appears not anxious.    ED Course  Procedures (including critical care time) DIAGNOSTIC STUDIES: Oxygen Saturation is 100% on room air, normal by my interpretation.    COORDINATION OF CARE: 15:25--I evaluated the patient and we discussed a treatment plan including blood work, urinalysis, head CT, and IV fluids to which the pt agreed.   18:01--I consulted with hospitalist Nani Skillern and explained to her the pt's abnormal labs. She thinks the abnormal labs are due to chronic issues. Our  treatment plan is to try to ambulate the pt. If she is able to ambulate she can be discharged.   19:03--The nurse reports the pt was able to ambulate earlier.  19:09--I rechecked the pt. One of her daughters is here and states she does not know why her other sister brought the pt in to the ED but she does notice the pt's eyes are not baseline.  A call with the daughter who brought her in says she did so because she was not walking baseline and her right eye looked different.  19:12--I reexamined the pt's eyes and told her that we would do an MRI.  21:52--Consult with neurologist, Dr. Gerilyn Pilgrim, who recommends  she be discharged and he will follow up with her in his office for further testing. He feels it is a medial rectus muscle problem (thryoid or MG) and less likely intranuclear ophthalmoplegia.    Results for orders placed during the hospital encounter of 09/03/12  GLUCOSE, CAPILLARY      Component Value Range   Glucose-Capillary 111 (*) 70 - 99 mg/dL   Comment 1 Notify RN    CBC WITH DIFFERENTIAL      Component Value Range   WBC 4.0  4.0 - 10.5 K/uL   RBC 3.60 (*) 3.87 - 5.11 MIL/uL   Hemoglobin 10.2 (*) 12.0 - 15.0 g/dL   HCT 16.1 (*) 09.6 - 04.5 %   MCV 88.6  78.0 - 100.0 fL   MCH 28.3  26.0 - 34.0 pg   MCHC 32.0  30.0 - 36.0 g/dL   RDW 40.9  81.1 - 91.4 %   Platelets 219  150 - 400 K/uL   Neutrophils Relative 63  43 - 77 %   Neutro Abs 2.5  1.7 - 7.7 K/uL   Lymphocytes Relative 22  12 - 46 %   Lymphs Abs 0.9  0.7 - 4.0 K/uL   Monocytes Relative 12  3 - 12 %   Monocytes Absolute 0.5  0.1 - 1.0 K/uL   Eosinophils Relative 2  0 - 5 %   Eosinophils Absolute 0.1  0.0 - 0.7 K/uL   Basophils Relative 1  0 - 1 %   Basophils Absolute 0.0  0.0 - 0.1 K/uL  BASIC METABOLIC PANEL      Component Value Range   Sodium 139  135 - 145 mEq/L   Potassium 4.5  3.5 - 5.1 mEq/L   Chloride 116 (*) 96 - 112 mEq/L   CO2 13 (*) 19 - 32 mEq/L   Glucose, Bld 100 (*) 70 - 99 mg/dL   BUN 35 (*) 6  - 23 mg/dL   Creatinine, Ser 7.82 (*) 0.50 - 1.10 mg/dL   Calcium 9.1  8.4 - 95.6 mg/dL   GFR calc non Af Amer 34 (*) >90 mL/min   GFR calc Af Amer 39 (*) >90 mL/min  URINALYSIS, ROUTINE W REFLEX MICROSCOPIC      Component Value Range   Color, Urine YELLOW  YELLOW   APPearance CLOUDY (*) CLEAR   Specific Gravity, Urine 1.020  1.005 - 1.030   pH 7.5  5.0 - 8.0   Glucose, UA NEGATIVE  NEGATIVE mg/dL   Hgb urine dipstick SMALL (*) NEGATIVE   Bilirubin Urine NEGATIVE  NEGATIVE   Ketones, ur NEGATIVE  NEGATIVE mg/dL   Protein, ur 213 (*) NEGATIVE mg/dL   Urobilinogen, UA 0.2  0.0 - 1.0 mg/dL   Nitrite NEGATIVE  NEGATIVE   Leukocytes, UA TRACE (*) NEGATIVE  TROPONIN I      Component Value Range   Troponin I <0.30  <0.30 ng/mL  SODIUM, URINE, RANDOM      Component Value Range   Sodium, Ur 103    URINE MICROSCOPIC-ADD ON      Component Value Range   Squamous Epithelial / LPF RARE  RARE   WBC, UA 3-6  <3 WBC/hpf   RBC / HPF 3-6  <3 RBC/hpf   Bacteria, UA MANY (*) RARE   Crystals TRIPLE PHOSPHATE CRYSTALS (*) NEGATIVE   Laboratory interpretation all normal except stable anemia, stable elevated chloride, stable metabolic acidosis, stable renal insufficiency   Dg Chest 1 View  09/03/2012  *  RADIOLOGY REPORT*  Clinical Data: Weakness, soft tissue mass right anterior shoulder.  CHEST - 1 VIEW  Comparison: 04/19/2012.  Findings: Status post CABG.  The heart, mediastinum and hilar contours are normal.  The lungs are clear and fully expanded.  No effusions or pneumothoraces.  No bony or focal soft tissue abnormalities.  IMPRESSION: No active disease.   Original Report Authenticated By: Mervin Hack, M.D.    Ct Head Wo Contrast  09/03/2012  *RADIOLOGY REPORT*  Clinical Data: Diffuse weakness.  History of previous infarct.  CT HEAD WITHOUT CONTRAST  Technique:  Contiguous axial images were obtained from the base of the skull through the vertex without contrast.  Comparison: MRI brain  without contrast 04/20/2012.  CT of head without contrast 04/19/2012.  Findings: Remote lacunar infarcts of the basal ganglia bilaterally are stable.  Remote encephalomalacia of the right occipital lobe is compatible with remote ischemia.  No acute cortical infarct, hemorrhage, mass lesion is present.  The ventricles are proportionate to the degree of atrophy.  No significant extra-axial fluid collection is present.  The paranasal sinuses and mastoid air cells are clear.  The osseous skull is intact.  IMPRESSION:  1.  No acute intracranial abnormality. 2.  Stable atrophy white matter disease. 3.  Multiple remote lacunar infarcts of the basal ganglia are stable. 4.  Remote right occipital lobe infarct.   Original Report Authenticated By: Jamesetta Orleans. MATTERN, M.D.       Date: 09/03/2012  Rate: 66  Rhythm: normal sinus rhythm and premature ventricular contractions (PVC)  QRS Axis: normal  Intervals: PR prolonged  ST/T Wave abnormalities: nonspecific T wave changes  Conduction Disutrbances:low voltage  Narrative Interpretation:   Old EKG Reviewed: unchanged from 04/19/2012     1. Weakness   2. Chloride, increased level   3. Anemia   4. Metabolic acidosis   5. Renal insufficiency   6. Ophthalmoplegia    Plan discharge  Devoria Albe, MD, FACEP    MDM   I personally performed the services described in this documentation, which was scribed in my presence. The recorded information has been reviewed and considered.  Devoria Albe, MD, Armando Gang   Ward Givens, MD 09/03/12 2209

## 2012-09-03 NOTE — ED Notes (Signed)
Daughter says pt has unsteady gait and thinks her bp is up. Alert, talking,  Denies pain , NVD

## 2012-12-21 ENCOUNTER — Encounter (HOSPITAL_COMMUNITY): Payer: Self-pay | Admitting: Emergency Medicine

## 2012-12-21 ENCOUNTER — Inpatient Hospital Stay (HOSPITAL_COMMUNITY)
Admission: EM | Admit: 2012-12-21 | Discharge: 2012-12-23 | DRG: 638 | Disposition: A | Discharge status: Home / Self Care | Payer: Medicare Other | Attending: Internal Medicine | Admitting: Internal Medicine

## 2012-12-21 DIAGNOSIS — Z79899 Other long term (current) drug therapy: Secondary | ICD-10-CM

## 2012-12-21 DIAGNOSIS — I10 Essential (primary) hypertension: Secondary | ICD-10-CM | POA: Diagnosis present

## 2012-12-21 DIAGNOSIS — E860 Dehydration: Secondary | ICD-10-CM | POA: Diagnosis present

## 2012-12-21 DIAGNOSIS — I2510 Atherosclerotic heart disease of native coronary artery without angina pectoris: Secondary | ICD-10-CM | POA: Diagnosis present

## 2012-12-21 DIAGNOSIS — N184 Chronic kidney disease, stage 4 (severe): Secondary | ICD-10-CM | POA: Diagnosis present

## 2012-12-21 DIAGNOSIS — E1110 Type 2 diabetes mellitus with ketoacidosis without coma: Secondary | ICD-10-CM

## 2012-12-21 DIAGNOSIS — N390 Urinary tract infection, site not specified: Secondary | ICD-10-CM | POA: Diagnosis present

## 2012-12-21 DIAGNOSIS — G40909 Epilepsy, unspecified, not intractable, without status epilepticus: Secondary | ICD-10-CM | POA: Diagnosis present

## 2012-12-21 DIAGNOSIS — N179 Acute kidney failure, unspecified: Secondary | ICD-10-CM

## 2012-12-21 DIAGNOSIS — E872 Acidosis, unspecified: Secondary | ICD-10-CM | POA: Diagnosis present

## 2012-12-21 DIAGNOSIS — Z951 Presence of aortocoronary bypass graft: Secondary | ICD-10-CM

## 2012-12-21 DIAGNOSIS — E119 Type 2 diabetes mellitus without complications: Secondary | ICD-10-CM

## 2012-12-21 DIAGNOSIS — N183 Chronic kidney disease, stage 3 unspecified: Secondary | ICD-10-CM | POA: Diagnosis present

## 2012-12-21 DIAGNOSIS — Z8673 Personal history of transient ischemic attack (TIA), and cerebral infarction without residual deficits: Secondary | ICD-10-CM

## 2012-12-21 DIAGNOSIS — Z87891 Personal history of nicotine dependence: Secondary | ICD-10-CM

## 2012-12-21 DIAGNOSIS — I129 Hypertensive chronic kidney disease with stage 1 through stage 4 chronic kidney disease, or unspecified chronic kidney disease: Secondary | ICD-10-CM | POA: Diagnosis present

## 2012-12-21 DIAGNOSIS — I639 Cerebral infarction, unspecified: Secondary | ICD-10-CM

## 2012-12-21 DIAGNOSIS — R739 Hyperglycemia, unspecified: Secondary | ICD-10-CM | POA: Diagnosis present

## 2012-12-21 DIAGNOSIS — IMO0001 Reserved for inherently not codable concepts without codable children: Principal | ICD-10-CM | POA: Diagnosis present

## 2012-12-21 DIAGNOSIS — R7309 Other abnormal glucose: Secondary | ICD-10-CM

## 2012-12-21 DIAGNOSIS — I252 Old myocardial infarction: Secondary | ICD-10-CM

## 2012-12-21 LAB — CBC WITH DIFFERENTIAL/PLATELET
Basophils Absolute: 0 10*3/uL (ref 0.0–0.1)
Basophils Relative: 0 % (ref 0–1)
Eosinophils Absolute: 0 10*3/uL (ref 0.0–0.7)
HCT: 34 % — ABNORMAL LOW (ref 36.0–46.0)
Hemoglobin: 10.7 g/dL — ABNORMAL LOW (ref 12.0–15.0)
MCH: 27.7 pg (ref 26.0–34.0)
MCHC: 31.5 g/dL (ref 30.0–36.0)
Monocytes Absolute: 0.3 10*3/uL (ref 0.1–1.0)
Monocytes Relative: 8 % (ref 3–12)
Neutro Abs: 2.4 10*3/uL (ref 1.7–7.7)
Neutrophils Relative %: 65 % (ref 43–77)
RDW: 15.9 % — ABNORMAL HIGH (ref 11.5–15.5)

## 2012-12-21 LAB — BASIC METABOLIC PANEL
BUN: 39 mg/dL — ABNORMAL HIGH (ref 6–23)
Chloride: 109 mEq/L (ref 96–112)
Creatinine, Ser: 1.72 mg/dL — ABNORMAL HIGH (ref 0.50–1.10)
GFR calc Af Amer: 33 mL/min — ABNORMAL LOW (ref >90)
GFR calc non Af Amer: 29 mL/min — ABNORMAL LOW (ref >90)

## 2012-12-21 LAB — URINALYSIS, ROUTINE W REFLEX MICROSCOPIC
Bilirubin Urine: NEGATIVE
Ketones, ur: NEGATIVE mg/dL
Nitrite: NEGATIVE
Protein, ur: 100 mg/dL — AB
Urobilinogen, UA: 0.2 mg/dL (ref 0.0–1.0)
pH: 7 (ref 5.0–8.0)

## 2012-12-21 LAB — GLUCOSE, CAPILLARY
Glucose-Capillary: 291 mg/dL — ABNORMAL HIGH (ref 70–99)
Glucose-Capillary: 73 mg/dL (ref 70–99)
Glucose-Capillary: 248 mg/dL — ABNORMAL HIGH (ref 70–99)

## 2012-12-21 MED ORDER — HYDRALAZINE HCL 25 MG PO TABS
25.0000 mg | ORAL_TABLET | Freq: Three times a day (TID) | ORAL | Status: DC
  Administered 2012-12-21 – 2012-12-23 (×5): 25 mg via ORAL
  Filled 2012-12-21 (×5): qty 1

## 2012-12-21 MED ORDER — SIMVASTATIN 10 MG PO TABS
5.0000 mg | ORAL_TABLET | Freq: Every day | ORAL | Status: DC
  Administered 2012-12-21 – 2012-12-22 (×2): 5 mg via ORAL
  Filled 2012-12-21 (×2): qty 1

## 2012-12-21 MED ORDER — FOLIC ACID 1 MG PO TABS
1.0000 mg | ORAL_TABLET | Freq: Every day | ORAL | Status: DC
  Administered 2012-12-22 – 2012-12-23 (×2): 1 mg via ORAL
  Filled 2012-12-21 (×2): qty 1

## 2012-12-21 MED ORDER — DONEPEZIL HCL 5 MG PO TABS
5.0000 mg | ORAL_TABLET | Freq: Every day | ORAL | Status: DC
  Administered 2012-12-21 – 2012-12-22 (×2): 5 mg via ORAL
  Filled 2012-12-21 (×2): qty 1

## 2012-12-21 MED ORDER — ACETAMINOPHEN 650 MG RE SUPP: 650.0000 mg | Freq: Four times a day (QID) | RECTAL | Status: DC | PRN

## 2012-12-21 MED ORDER — ONDANSETRON HCL 4 MG/2ML IJ SOLN: 4.0000 mg | Freq: Four times a day (QID) | INTRAMUSCULAR | Status: DC | PRN

## 2012-12-21 MED ORDER — HYDRALAZINE HCL 20 MG/ML IJ SOLN
10.0000 mg | Freq: Four times a day (QID) | INTRAMUSCULAR | Status: DC | PRN
  Administered 2012-12-23: 10 mg via INTRAVENOUS
  Filled 2012-12-21: qty 1

## 2012-12-21 MED ORDER — SODIUM CHLORIDE 0.9 % IV SOLN
INTRAVENOUS | Status: DC
  Administered 2012-12-22: 08:00:00 via INTRAVENOUS

## 2012-12-21 MED ORDER — METOPROLOL SUCCINATE ER 50 MG PO TB24
100.0000 mg | ORAL_TABLET | Freq: Two times a day (BID) | ORAL | Status: DC
  Administered 2012-12-21 – 2012-12-23 (×4): 100 mg via ORAL
  Filled 2012-12-21: qty 1
  Filled 2012-12-21 (×3): qty 2

## 2012-12-21 MED ORDER — LEVETIRACETAM 100 MG/ML PO SOLN
500.0000 mg | Freq: Two times a day (BID) | ORAL | Status: DC
  Administered 2012-12-21 – 2012-12-23 (×4): 500 mg via ORAL
  Filled 2012-12-21 (×6): qty 5

## 2012-12-21 MED ORDER — DOCUSATE SODIUM 100 MG PO CAPS
100.0000 mg | ORAL_CAPSULE | Freq: Two times a day (BID) | ORAL | Status: DC
  Administered 2012-12-21 – 2012-12-23 (×4): 100 mg via ORAL
  Filled 2012-12-21 (×4): qty 1

## 2012-12-21 MED ORDER — HYDROCODONE-ACETAMINOPHEN 5-325 MG PO TABS
1.0000 | ORAL_TABLET | ORAL | Status: DC | PRN
  Administered 2012-12-21: 2 via ORAL
  Filled 2012-12-21: qty 2
  Filled 2012-12-21: qty 1

## 2012-12-21 MED ORDER — SODIUM CHLORIDE 0.9 % IV SOLN: INTRAVENOUS | Status: DC

## 2012-12-21 MED ORDER — HEPARIN SODIUM (PORCINE) 5000 UNIT/ML IJ SOLN
5000.0000 [IU] | Freq: Three times a day (TID) | INTRAMUSCULAR | Status: DC
  Administered 2012-12-21 – 2012-12-23 (×5): 5000 [IU] via SUBCUTANEOUS
  Filled 2012-12-21 (×5): qty 1

## 2012-12-21 MED ORDER — INSULIN ASPART 100 UNIT/ML ~~LOC~~ SOLN
10.0000 [IU] | Freq: Once | SUBCUTANEOUS | Status: AC
  Administered 2012-12-21: 10 [IU] via INTRAVENOUS
  Filled 2012-12-21: qty 1

## 2012-12-21 MED ORDER — ENALAPRIL MALEATE 5 MG PO TABS: 5.0000 mg | ORAL_TABLET | Freq: Two times a day (BID) | ORAL | Status: DC

## 2012-12-21 MED ORDER — ONDANSETRON HCL 4 MG PO TABS: 4.0000 mg | ORAL_TABLET | Freq: Four times a day (QID) | ORAL | Status: DC | PRN

## 2012-12-21 MED ORDER — ALUM & MAG HYDROXIDE-SIMETH 200-200-20 MG/5ML PO SUSP: 30.0000 mL | Freq: Four times a day (QID) | ORAL | Status: DC | PRN

## 2012-12-21 MED ORDER — INSULIN ASPART 100 UNIT/ML ~~LOC~~ SOLN
0.0000 [IU] | Freq: Three times a day (TID) | SUBCUTANEOUS | Status: DC
  Administered 2012-12-22: 2 [IU] via SUBCUTANEOUS
  Administered 2012-12-22 (×2): 5 [IU] via SUBCUTANEOUS
  Administered 2012-12-23: 3 [IU] via SUBCUTANEOUS
  Administered 2012-12-23: 5 [IU] via SUBCUTANEOUS

## 2012-12-21 MED ORDER — ACETAMINOPHEN 325 MG PO TABS: 650.0000 mg | ORAL_TABLET | Freq: Four times a day (QID) | ORAL | Status: DC | PRN

## 2012-12-21 MED ORDER — ASPIRIN-DIPYRIDAMOLE ER 25-200 MG PO CP12
1.0000 | ORAL_CAPSULE | Freq: Two times a day (BID) | ORAL | Status: DC
  Administered 2012-12-21 – 2012-12-23 (×4): 1 via ORAL
  Filled 2012-12-21 (×6): qty 1

## 2012-12-21 MED ORDER — SODIUM CHLORIDE 0.9 % IV SOLN
Freq: Once | INTRAVENOUS | Status: AC
  Administered 2012-12-21: 14:00:00 via INTRAVENOUS

## 2012-12-21 NOTE — H&P (Signed)
[facility] History and Physical  [name] ZOX:[id number] DOB: 1940/12/18 DOA: 12/21/2012  Referring physician: Ignacia Palma PCP: Evlyn Courier, MD  Specialists:   Chief Complaint: hyperglycemia  HPI: [name] is a 72 y.o. female with past medical hx including stroke, HTN, DM and seizure disorder presents to [facility] ED with CC high blood sugar. Information obtained from pt and record.  She reports that she checks her blood sugar once a day and that it has been "high" for 2-3 weeks. She states that her primary [name] said to come to ED if sugar over 300. She denies a sugar over 300 more than once but does report a steady increase in CBG's. She states that she eats "whatever I want". She denies recent illness, fever, chills, cough, nausea, vomiting, abdominal pain, diarrhea, dysuria hematuria or melena. Symptoms came on gradually have persisted and characterized as moderate. In ED work up yields glucose 364 anion gap of 10 and creatinine 1.72. We are asked to admit   Review of Systems: The patient denies anorexia, fever, weight loss,, vision loss, decreased hearing, hoarseness, chest pain, syncope, dyspnea on exertion, peripheral edema, balance deficits, hemoptysis, abdominal pain, melena, hematochezia, severe indigestion/heartburn, hematuria, incontinence, genital sores, muscle weakness, suspicious skin lesions, transient blindness, difficulty walking, depression, unusual weight change, abnormal bleeding, enlarged lymph nodes, angioedema, and breast masses.    Past Medical History  Diagnosis Date  . Diabetes mellitus   . Hypertension   . Stroke   . MI, old    Past Surgical History  Procedure Date  . Coronary artery bypass graft   . Tee without cardioversion 01/28/2012    Procedure: TRANSESOPHAGEAL ECHOCARDIOGRAM (TEE);  Surgeon: Lewayne Bunting, MD;  Location: [facility] ENDOSCOPY;  Service: Cardiovascular;  Laterality: [name];   Social History:  reports that she has quit smoking.  Her smoking use included Cigarettes. She has a 50 pack-year smoking history. She does not have any smokeless tobacco history on file. She reports that she does not drink alcohol or use illicit drugs. Pt lives with daughter and in independent with ADL's. Has help with meds  No Known Allergies  Family History  Problem Relation Age of Onset  . Stroke Father   . Diabetes type II Mother      Prior to Admission medications   Medication Sig Start Date End Date Taking? Authorizing Provider  dipyridamole-aspirin (AGGRENOX) 25-200 MG per 12 hr capsule Take 1 capsule by mouth 2 (two) times daily.   Yes Historical Provider, MD  donepezil (ARICEPT) 5 MG tablet Take 5 mg by mouth daily.   Yes Historical Provider, MD  enalapril (VASOTEC) 5 MG tablet Take 5 mg by mouth 2 (two) times daily.   Yes Historical Provider, MD  folic acid (FOLVITE) 1 MG tablet Take 1 mg by mouth daily.   Yes Historical Provider, MD  levETIRAcetam (KEPPRA) 100 MG/ML solution Take 500 mg by mouth 2 (two) times daily.   Yes Historical Provider, MD  metoprolol (TOPROL-XL) 100 MG 24 hr tablet Take 100 mg by mouth 2 (two) times daily.    Yes Historical Provider, MD  pravastatin (PRAVACHOL) 20 MG tablet Take 20 mg by mouth daily.     Yes Historical Provider, MD  Vitamin D, Ergocalciferol, (DRISDOL) 50000 UNITS CAPS Take 50,000 Units by mouth every 7 (seven) days. Takes on Tuesday   Yes Historical Provider, MD   Physical Exam: Filed Vitals:   12/21/12 1400 12/21/12 1400 12/21/12 1442 12/21/12 1452  BP: 146/113 195/68  184/75   Pulse: 109 61 61   Temp:   98 F (36.7 C)   TempSrc:   Oral   Resp: 27 18 20    Height:    5\' 2"  (1.575 m)  Weight:      SpO2: 96% 95% 97%      General:  Alert well nourished  Eyes: PERRL EOMI no scleral icterus  ENT: ears clear, nose without drainage, mouth pink slightly dry very poor dentition  Neck: supple  Cardiovascular: RRR No MGR No LEE  Respiratory: normal effort BSCTAB No  wheeze  Abdomen: soft +BS non-tender to palpation  Skin: warm dry no rash lesion  Musculoskeletal: MOE No joint swelling erythema non-tender  Psychiatric: appropriate cooperative  Neurologic: cranial nerve Ii-XII intact. Speech clear facial symmetry  Labs on Admission:  Basic Metabolic Panel:  Lab 12/21/12 1610  NA 136  K 4.2  CL 109  CO2 17*  GLUCOSE 364*  BUN 39*  CREATININE 1.72*  CALCIUM 9.0  MG --  PHOS --   Liver Function Tests: No results found for this basename: AST:5,ALT:5,ALKPHOS:5,BILITOT:5,PROT:5,ALBUMIN:5 in the last 168 hours No results found for this basename: LIPASE:5,AMYLASE:5 in the last 168 hours No results found for this basename: AMMONIA:5 in the last 168 hours CBC:  Lab 12/21/12 1125  WBC 3.7*  NEUTROABS 2.4  HGB 10.7*  HCT 34.0*  MCV 88.1  PLT 180   Cardiac Enzymes: No results found for this basename: CKTOTAL:5,CKMB:5,CKMBINDEX:5,TROPONINI:5 in the last 168 hours  BNP (last 3 results) No results found for this basename: PROBNP:3 in the last 8760 hours CBG:  Lab 12/21/12 1045  GLUCAP 291*    Radiological Exams on Admission: No results found.   Assessment/Plan Principal Problem:  *Hyperglycemia: likely related to poor compliance. Admit to medical floor. Vigorous IV hydration and SSI. Anion gap 10. Pt sister who is primary caregiver reports checking sugar BID and PCP had taken pt off of glyburide/metformin about 3 months ago. Will check A1c. Monitor closely Active Problems  Dehydration.  Likely due to hyperglycemia.  Will hydrate and observe.  Chronic kidney disease stage 3 : Creatinine 1.7. Chart review indicates baseline range 1.5-1.6. Will hold nephrotoxins and hydrate with IV fluids. bmet in am.   Diabetes: see #1. Will check A1C. Daughter indicates sugar stable for last 3 months when meds stopped until about 2 weeks ago. Will use SSI for glycemic control. Carb modified diet. Would avoid oral hypoglycemics due to chronic kidney  disease.   Hypertension: SBP range 146-190. Will continue BB but hold ACE due to #2. Add scheduled and prn hydralazine. Will monitor closely.    Seizure disorder: stable. Daughter reports no seizure activity for 1 year. On Keppra will continue.       Code Status: full Family Communication: daughter vicky Disposition Plan: home with daughter  Time spent: 104 mins  [facility]   If 7PM-7AM, please contact night-coverage [email or web address] Password [facility] 12/21/2012, 3:19 PM    Attending note:  Patient seen and examined.  Above note reviewed. Patient presents with hyperglycemia and dehydration.  Will plan on obs admit for IV fluids and adjusting diabetic regimen.  No focus of infection.

## 2012-12-21 NOTE — ED Provider Notes (Signed)
History    This chart was scribed for Osvaldo Human, MD, MD by Smitty Pluck, ED Scribe. The patient was seen in room APA01/APA01 and the patient's care was started at 11:14 AM.   CSN: [id number]  Arrival date & time 12/21/12  1018   None     Chief Complaint  Patient presents with  . Hyperglycemia     The history is provided by the patient and a relative. No language interpreter was used.   [name] is a 72 y.o. female with hx of DM, stroke, HTN and MI who presents to the Emergency Department complaining of hyperglycemia onset 2-3 weeks ago. Pt reports today her CBG was 306 at home and per daughter [name] said to bring her to ED whenever her CBG is above 300. Pt is unsure of the medications she takes for DM management. Daughter reports [name] told pt to stop taking DM medication 3 months ago because he said that it might be the cause of her elevated glucose. Pt denies fever, chills, nausea, vomiting, diarrhea, weakness, cough, SOB and any other pain.   Past Medical History  Diagnosis Date  . Diabetes mellitus   . Hypertension   . Stroke   . MI, old     Past Surgical History  Procedure Date  . Coronary artery bypass graft   . Tee without cardioversion 01/28/2012    Procedure: TRANSESOPHAGEAL ECHOCARDIOGRAM (TEE);  Surgeon: Lewayne Bunting, MD;  Location: [facility] ENDOSCOPY;  Service: Cardiovascular;  Laterality: [name];    Family History  Problem Relation Age of Onset  . Stroke Father   . Diabetes type II Mother     History  Substance Use Topics  . Smoking status: Former Smoker -- 1.0 packs/day for 50 years    Types: Cigarettes  . Smokeless tobacco: Not on file  . Alcohol Use: No    OB History    Grav Para Term Preterm Abortions TAB SAB Ect Mult Living                  Review of Systems  Constitutional: Negative for fever and chills.  Respiratory: Negative for cough and shortness of breath.   Gastrointestinal: Negative for nausea, vomiting and diarrhea.    Neurological: Negative for weakness.  All other systems reviewed and are negative.    Allergies  Review of patient's allergies indicates no known allergies.  Home Medications   Current Outpatient Rx  Name  Route  Sig  Dispense  Refill  . ASPIRIN-DIPYRIDAMOLE ER 25-200 MG PO CP12   Oral   Take 1 capsule by mouth 2 (two) times daily.         . ENALAPRIL MALEATE 5 MG PO TABS   Oral   Take 5 mg by mouth 2 (two) times daily.         . GLYBURIDE-METFORMIN 2.5-500 MG PO TABS   Oral   Take 0.5 tablets by mouth daily with breakfast.          . LEVETIRACETAM 500 MG PO TABS   Oral   Take 1 tablet (500 mg total) by mouth 2 (two) times daily.   60 tablet   2   . METOPROLOL SUCCINATE ER 100 MG PO TB24   Oral   Take 100 mg by mouth 2 (two) times daily.          Marland Kitchen PRAVASTATIN SODIUM 20 MG PO TABS   Oral   Take 20 mg by mouth daily.  BP 178/58  Pulse 66  Temp 97.7 F (36.5 C) (Oral)  Resp 20  Wt 145 lb (65.772 kg)  SpO2 97%  Physical Exam  Nursing note and vitals reviewed. Constitutional: She is oriented to person, place, and time. She appears well-developed and well-nourished. No distress.  HENT:  Head: Normocephalic and atraumatic.  Eyes: EOM are normal. Pupils are equal, round, and reactive to light.  Neck: Normal range of motion. Neck supple. No tracheal deviation present.  Cardiovascular: Normal rate.   Pulmonary/Chest: Effort normal. No respiratory distress.  Abdominal: Soft. She exhibits no distension.  Musculoskeletal: Normal range of motion.  Neurological: She is alert and oriented to person, place, and time.  Skin: Skin is warm and dry.  Psychiatric: She has a normal mood and affect. Her behavior is normal.    ED Course  Procedures (including critical care time) DIAGNOSTIC STUDIES: Oxygen Saturation is 97% on room air, normal by my interpretation.    COORDINATION OF CARE: 11:22 AM Discussed ED treatment with pt and pt agrees.    12:45 PM Results for orders placed during the hospital encounter of 12/21/12  GLUCOSE, CAPILLARY      Component Value Range   Glucose-Capillary 291 (*) 70 - 99 mg/dL  CBC WITH DIFFERENTIAL      Component Value Range   WBC 3.7 (*) 4.0 - 10.5 K/uL   RBC 3.86 (*) 3.87 - 5.11 MIL/uL   Hemoglobin 10.7 (*) 12.0 - 15.0 g/dL   HCT 51.7 (*) 61.6 - 07.3 %   MCV 88.1  78.0 - 100.0 fL   MCH 27.7  26.0 - 34.0 pg   MCHC 31.5  30.0 - 36.0 g/dL   RDW 71.0 (*) 62.6 - 94.8 %   Platelets 180  150 - 400 K/uL   Neutrophils Relative 65  43 - 77 %   Neutro Abs 2.4  1.7 - 7.7 K/uL   Lymphocytes Relative 26  12 - 46 %   Lymphs Abs 1.0  0.7 - 4.0 K/uL   Monocytes Relative 8  3 - 12 %   Monocytes Absolute 0.3  0.1 - 1.0 K/uL   Eosinophils Relative 1  0 - 5 %   Eosinophils Absolute 0.0  0.0 - 0.7 K/uL   Basophils Relative 0  0 - 1 %   Basophils Absolute 0.0  0.0 - 0.1 K/uL  BASIC METABOLIC PANEL      Component Value Range   Sodium 136  135 - 145 mEq/L   Potassium 4.2  3.5 - 5.1 mEq/L   Chloride 109  96 - 112 mEq/L   CO2 17 (*) 19 - 32 mEq/L   Glucose, Bld 364 (*) 70 - 99 mg/dL   BUN 39 (*) 6 - 23 mg/dL   Creatinine, Ser 5.46 (*) 0.50 - 1.10 mg/dL   Calcium 9.0  8.4 - 27.0 mg/dL   GFR calc non Af Amer 29 (*) >90 mL/min   GFR calc Af Amer 33 (*) >90 mL/min   12:46 PM Lab tests show DKA.  Will have IV started, start IV Insulin.    1:43 PM Case discussed with Dr. Kerry Hough.  Admit to telemetry to [facility] 2.      1. Diabetic ketoacidosis     I personally performed the services described in this documentation, which was scribed in my presence. The recorded information has been reviewed and is accurate.  Osvaldo Human, MD      Carleene Cooper III, MD 12/21/12 1344

## 2012-12-21 NOTE — ED Notes (Signed)
Tiona Ruane (daughter) work [phone]

## 2012-12-21 NOTE — ED Notes (Signed)
Pt daughter states CBG this am was 306 this am and her PCP states to go to ED when it reaches three hundred. Pt denies symptoms.

## 2012-12-22 LAB — GLUCOSE, CAPILLARY: Glucose-Capillary: 122 mg/dL — ABNORMAL HIGH (ref 70–99)

## 2012-12-22 LAB — BASIC METABOLIC PANEL
BUN: 34 mg/dL — ABNORMAL HIGH (ref 6–23)
Creatinine, Ser: 1.47 mg/dL — ABNORMAL HIGH (ref 0.50–1.10)
GFR calc non Af Amer: 35 mL/min — ABNORMAL LOW (ref >90)
Glucose, Bld: 262 mg/dL — ABNORMAL HIGH (ref 70–99)
Potassium: 4.2 mEq/L (ref 3.5–5.1)

## 2012-12-22 LAB — HEMOGLOBIN A1C
Hgb A1c MFr Bld: 9.6 % — ABNORMAL HIGH (ref <5.7)
Mean Plasma Glucose: 229 mg/dL — ABNORMAL HIGH (ref <117)

## 2012-12-22 LAB — CBC
HCT: 31.5 % — ABNORMAL LOW (ref 36.0–46.0)
Hemoglobin: 9.9 g/dL — ABNORMAL LOW (ref 12.0–15.0)
MCH: 27.3 pg (ref 26.0–34.0)
MCHC: 31.4 g/dL (ref 30.0–36.0)

## 2012-12-22 LAB — URINE CULTURE

## 2012-12-22 MED ORDER — SODIUM CHLORIDE 0.45 % IV SOLN
INTRAVENOUS | Status: DC
  Administered 2012-12-22 – 2012-12-23 (×2): via INTRAVENOUS
  Filled 2012-12-22 (×3): qty 1000

## 2012-12-22 MED ORDER — INSULIN DETEMIR 100 UNIT/ML ~~LOC~~ SOLN
10.0000 [IU] | Freq: Every day | SUBCUTANEOUS | Status: DC
  Filled 2012-12-22: qty 10

## 2012-12-22 MED ORDER — CEFTRIAXONE SODIUM 1 G IJ SOLR
1.0000 g | INTRAMUSCULAR | Status: DC
  Administered 2012-12-22: 1 g via INTRAVENOUS
  Filled 2012-12-22 (×2): qty 10

## 2012-12-22 MED ORDER — GLIMEPIRIDE 2 MG PO TABS
2.0000 mg | ORAL_TABLET | Freq: Every day | ORAL | Status: DC
  Administered 2012-12-22 – 2012-12-23 (×2): 2 mg via ORAL
  Filled 2012-12-22 (×2): qty 1

## 2012-12-22 NOTE — Progress Notes (Signed)
[facility]             Progress Note   Subjective: No complaints.    Objective: Vital signs in last 24 hours: Temp:  [97.7 F (36.5 C)-98 F (36.7 C)] 97.8 F (36.6 C) (01/29 1030) Pulse Rate:  [55-109] 57  (01/29 1030) Resp:  [18-27] 20  (01/29 1030) BP: (141-195)/(68-116) 155/116 mmHg (01/29 1030) SpO2:  [95 %-100 %] 100 % (01/29 1030) Weight change:  Last BM Date: 12/19/12  Intake/Output from previous day: 01/28 0701 - 01/29 0700 In: 495 [P.O.:240; I.V.:255] Out: 600 [Urine:600]     Physical Exam: General: Alert, awake, oriented x3, in no acute distress. HEENT: No bruits, no goiter. Heart: Regular rate and rhythm, without murmurs, rubs, gallops. Lungs: Clear to auscultation bilaterally. Abdomen: Soft, nontender, nondistended, positive bowel sounds. Extremities: No clubbing cyanosis or edema with positive pedal pulses. Neuro: Grossly intact, nonfocal.    Lab Results: Basic Metabolic Panel:  Basename 12/22/12 0412 12/21/12 1125  NA 137 136  K 4.2 4.2  CL 114* 109  CO2 15* 17*  GLUCOSE 262* 364*  BUN 34* 39*  CREATININE 1.47* 1.72*  CALCIUM 8.3* 9.0  MG -- --  PHOS -- --   Liver Function Tests: No results found for this basename: AST:2,ALT:2,ALKPHOS:2,BILITOT:2,PROT:2,ALBUMIN:2 in the last 72 hours No results found for this basename: LIPASE:2,AMYLASE:2 in the last 72 hours No results found for this basename: AMMONIA:2 in the last 72 hours CBC:  Basename 12/22/12 0412 12/21/12 1125  WBC 4.5 3.7*  NEUTROABS -- 2.4  HGB 9.9* 10.7*  HCT 31.5* 34.0*  MCV 87.0 88.1  PLT 182 180   Cardiac Enzymes: No results found for this basename: CKTOTAL:3,CKMB:3,CKMBINDEX:3,TROPONINI:3 in the last 72 hours BNP: No results found for this basename: PROBNP:3 in the last 72 hours D-Dimer: No results found for this basename: DDIMER:2 in the last 72 hours CBG:  Basename 12/22/12 0818 12/21/12 2131 12/21/12 1647 12/21/12 1045  GLUCAP 215* 248* 73 291*     Hemoglobin A1C:  Basename 12/21/12 1125  HGBA1C 9.6*   Fasting Lipid Panel: No results found for this basename: CHOL,HDL,LDLCALC,TRIG,CHOLHDL,LDLDIRECT in the last 72 hours Thyroid Function Tests: No results found for this basename: TSH,T4TOTAL,FREET4,T3FREE,THYROIDAB in the last 72 hours Anemia Panel: No results found for this basename: VITAMINB12,FOLATE,FERRITIN,TIBC,IRON,RETICCTPCT in the last 72 hours Coagulation: No results found for this basename: LABPROT:2,INR:2 in the last 72 hours Urine Drug Screen: Drugs of Abuse     Component Value Date/Time   LABOPIA NONE DETECTED 01/25/2012 1154   COCAINSCRNUR NONE DETECTED 01/25/2012 1154   LABBENZ NONE DETECTED 01/25/2012 1154   AMPHETMU NONE DETECTED 01/25/2012 1154   THCU NONE DETECTED 01/25/2012 1154   LABBARB NONE DETECTED 01/25/2012 1154    Alcohol Level: No results found for this basename: ETH:2 in the last 72 hours Urinalysis:  Basename 12/21/12 1255  COLORURINE YELLOW  LABSPEC 1.025  PHURINE 7.0  GLUCOSEU NEGATIVE  HGBUR SMALL*  BILIRUBINUR NEGATIVE  KETONESUR NEGATIVE  PROTEINUR 100*  UROBILINOGEN 0.2  NITRITE NEGATIVE  LEUKOCYTESUR TRACE*    No results found for this or any previous visit (from the past 240 hour(s)).  Studies/Results: No results found.  Medications: Scheduled Meds:   . cefTRIAXone (ROCEPHIN)  IV  1 g Intravenous Q24H  . dipyridamole-aspirin  1 capsule Oral BID  . docusate sodium  100 mg Oral BID  . donepezil  5 mg Oral QHS  . folic acid  1 mg Oral Daily  . glimepiride  2 mg  Oral Q breakfast  . heparin  5,000 Units Subcutaneous Q8H  . hydrALAZINE  25 mg Oral Q8H  . insulin aspart  0-15 Units Subcutaneous TID WC  . levETIRAcetam  500 mg Oral BID  . metoprolol succinate  100 mg Oral BID  . simvastatin  5 mg Oral q1800   Continuous Infusions:   . sodium chloride 0.45 % 1,000 mL with sodium bicarbonate 100 mEq infusion     PRN Meds:.acetaminophen, acetaminophen, alum & mag  hydroxide-simeth, hydrALAZINE, HYDROcodone-acetaminophen, ondansetron (ZOFRAN) IV, ondansetron  Assessment/Plan:  Principal Problem:  *Dehydration Active Problems:  Hypertension  Seizure disorder  Hyperglycemia  CKD (chronic kidney disease) stage 3, GFR 30-59 ml/min  Diabetes  1. Dehydration, improving with IV fluids.  2. Metabolic acidosis, likely due to chronic kidney disease, will start bicarb supplementation.  She does not appear to be toxic.  3. Hyperglycemia in uncontrolled diabetes type 2.  Blood sugars are improving.  Continue sliding scale.  Start amaryl  4.  CKD3.  Her creatinine appears to be near baseline.  5. Possible UTI. Urinalysis indicates possible UTI, and urine is very malodorous.  Started rocephin.  Follow up cultures.  6. HTN.  Stable  7. Seizure disorder.  Stable  8. Dispo.  Likely home tomorrow if continued improvement.  Time spent coordinating care:   LOS: 1 day   [name] [facility] Pager: [phone] 12/22/2012, 12:34 PM

## 2012-12-22 NOTE — Progress Notes (Signed)
UR Chart Review Completed  

## 2012-12-22 NOTE — Progress Notes (Signed)
Inpatient Diabetes Program Recommendations  AACE/ADA: New Consensus Statement on Inpatient Glycemic Control (2013)  Target Ranges:  Prepandial:   less than 140 mg/dL      Peak postprandial:   less than 180 mg/dL (1-2 hours)      Critically ill patients:  140 - 180 mg/dL   Reason for Visit: referral for hyperglycemia Have read H & P and noted that pt's oral dm meds were discontinued 3 mos ago.  Pt had been on Metformin which is clearly not to be used in a patient with kidney disease. The Glyburide was discontinued also, but there is no record of alternative med suggestion.  Pt appears to have been in acidosis, but due to normal anion gap, the acidosis is likely not due to diabetes but possibly lactic acidosis.  Regarding a recommendation for medication/glucose control, I would recommend Amaryl 2 mg/day (half the dose normally used).  Pt may need education regarding diet for dm and kidney disease.  Recommend a renal consult as well as dietician consult while there. Pt can go to OP education at [facility] as well. Attempted to contact pt's daughter, Lynden Ang, but I got no answer. One of our diabetes coordinators will be at [facility] tomorrow and can discuss with pt and daughter.    Note: Thank you, Lenor Coffin, RN, CNS, Diabetes Coordinator [phone])

## 2012-12-23 DIAGNOSIS — N179 Acute kidney failure, unspecified: Secondary | ICD-10-CM

## 2012-12-23 LAB — BASIC METABOLIC PANEL
CO2: 20 mEq/L (ref 19–32)
Calcium: 8.3 mg/dL — ABNORMAL LOW (ref 8.4–10.5)
Chloride: 112 mEq/L (ref 96–112)
Glucose, Bld: 183 mg/dL — ABNORMAL HIGH (ref 70–99)
Sodium: 140 mEq/L (ref 135–145)

## 2012-12-23 LAB — GLUCOSE, CAPILLARY: Glucose-Capillary: 236 mg/dL — ABNORMAL HIGH (ref 70–99)

## 2012-12-23 MED ORDER — ALUM & MAG HYDROXIDE-SIMETH 200-200-20 MG/5ML PO SUSP: 30.0000 mL | Freq: Four times a day (QID) | ORAL | Status: DC | PRN

## 2012-12-23 MED ORDER — HYDRALAZINE HCL 50 MG PO TABS: 50.0000 mg | ORAL_TABLET | Freq: Three times a day (TID) | ORAL | Status: DC

## 2012-12-23 MED ORDER — GLIMEPIRIDE 2 MG PO TABS: 2.0000 mg | ORAL_TABLET | Freq: Every day | ORAL | Status: DC

## 2012-12-23 MED ORDER — HYDRALAZINE HCL 25 MG PO TABS
50.0000 mg | ORAL_TABLET | Freq: Three times a day (TID) | ORAL | Status: DC
  Administered 2012-12-23: 50 mg via ORAL
  Filled 2012-12-23: qty 2

## 2012-12-23 MED ORDER — DSS 100 MG PO CAPS: 100.0000 mg | ORAL_CAPSULE | Freq: Two times a day (BID) | ORAL | Status: DC

## 2012-12-23 NOTE — Progress Notes (Signed)
Pt's blood pressure at recheck 179/82.  Gave afternoon dose of Apresoline.  Dr. Kerry Hough paged.  Dr. Kerry Hough returned page and gave order to proceed with discharge.  Reviewed AVS with pt's daughter.  Provided pt's daughter with the two new prescriptions.  Pt's daughter verbalized understanding of d/c instructions.  Pt transported by NT via w/c to main entrance for d/c.

## 2012-12-23 NOTE — Discharge Summary (Signed)
Physician Discharge Summary  [name] ZOX:[id number] DOB: 08-Aug-1941 DOA: 12/21/2012  PCP: Evlyn Courier, MD  Admit date: 12/21/2012 Discharge date: 12/23/2012  Time spent: 40 minutes  Recommendations for Outpatient Follow-up:  1. Pt has appointment with PCP 12/28/12. Recommend bmet to evaluate renal function. Recommend close monitoring of BP as BP medicine has been changed due to renal function 2. Pt to keep journal of daily CBG's and take journal to PCP appointment.   Discharge Diagnoses:  Principal Problem:  *Dehydration Active Problems:  Hypertension  Seizure disorder  Hyperglycemia  CKD (chronic kidney disease) stage 3, GFR 30-59 ml/min  Diabetes   Discharge Condition: stable and ready for discharge to home  Diet recommendation: carb modified  Filed Weights   12/21/12 1036  Weight: 65.772 kg (145 lb)    History of present illness:  [name] is a very pleasant 72 y.o. female with past medical hx including stroke, HTN, DM and seizure disorder presented to [facility] ED on 12/21/12 with CC high blood sugar. She reported that she checks her blood sugar once a day and that it has been "high" for 2-3 weeks. She stated that her primary [name] said to come to ED if sugar over 300. She denied a sugar over 300 more than once but did report a steady increase in CBG's. She stated that she eats "whatever I want". She denied recent illness, fever, chills, cough, nausea, vomiting, abdominal pain, diarrhea, dysuria hematuria or melena.  In ED glucose 364 with anion gap of 10 and creatinine 1.72.    Hospital Course:  1. Dehydration related to hyperglycemia. Pt admitted to medical floor and provided with vigorous IV hydration. Pt responded positively to treatment. At discharge BUN and creatinine trending down  2. Metabolic acidosis, likely due to chronic kidney disease. Pt provided with IV bicarb supplementation. She did not appear toxic during this hospitalization. At discharge  CO2 20 up from 15. Recommend bmet at follow up appointment .  3. Hyperglycemia in uncontrolled diabetes type 2. Initally pt provided with SSI. Pt seen by diabetic coordinator. Amaryl low dose initiated. Pt will continue low dose amaryl at discharge. Pt will follow up with PCP 12/28/12.  4. CKD3.  Her creatinine appears to be near baseline. Her ACE was discontinued due CKD. At discharge creatinine 1.2 down from 1.7 on admission. Recommend bmet at follow up PCP appointment.  5. Possible UTI. Urinalysis indicates possible UTI and Rocephin provided. At discharge culture yields no growth. No indication for further antibiotics.  Pt remained afebrile and non-toxic during this hospitalization.   6. HTN. Poor control likely related to discontinuation of ACE due to renal function. Hydralazine started and home BB resumed. At discharge SBP range 158-186 with diastolic range 57-92. Recommend close OP follow up for optimal BP control.   7. Seizure disorder. Stable. No seizure activity this hospitalization.       Procedures: none Consultations:  Diabetes coordinator  Discharge Exam: Filed Vitals:   12/22/12 2206 12/23/12 0410 12/23/12 0555 12/23/12 0630  BP: 205/77 186/70 186/92 158/90  Pulse: 72 64 70   Temp: 97.8 F (36.6 C) 97.2 F (36.2 C)    TempSrc: Oral Oral    Resp: 20 18    Height:      Weight:      SpO2: 100% 100%      General: awake alert well nourished NAD Cardiovascular: RRR No MGR No LEE PPP Respiratory: Normal effort BSCTAB No wheeze/rhonchi  Discharge Instructions  Discharge  Orders    Future Orders Please Complete By Expires   Diet Carb Modified      Increase activity slowly      Discharge instructions      Comments:   Please keep daily journal of CBG readings until 12/28/12.  Take this journal to PCP appointment on 12/28/12.   Call MD for:  persistant nausea and vomiting      Call MD for:  difficulty breathing, headache or visual disturbances          Medication List      As of 12/23/2012 11:10 AM    STOP taking these medications         enalapril 5 MG tablet   Commonly known as: VASOTEC      TAKE these medications         alum & mag hydroxide-simeth 200-200-20 MG/5ML suspension   Commonly known as: MAALOX/MYLANTA   Take 30 mLs by mouth every 6 (six) hours as needed (dyspepsia).      dipyridamole-aspirin 200-25 MG per 12 hr capsule   Commonly known as: AGGRENOX   Take 1 capsule by mouth 2 (two) times daily.      donepezil 5 MG tablet   Commonly known as: ARICEPT   Take 5 mg by mouth daily.      DSS 100 MG Caps   Take 100 mg by mouth 2 (two) times daily.      folic acid 1 MG tablet   Commonly known as: FOLVITE   Take 1 mg by mouth daily.      glimepiride 2 MG tablet   Commonly known as: AMARYL   Take 1 tablet (2 mg total) by mouth daily with breakfast.      hydrALAZINE 50 MG tablet   Commonly known as: APRESOLINE   Take 1 tablet (50 mg total) by mouth every 8 (eight) hours.      levETIRAcetam 100 MG/ML solution   Commonly known as: KEPPRA   Take 500 mg by mouth 2 (two) times daily.      metoprolol succinate 100 MG 24 hr tablet   Commonly known as: TOPROL-XL   Take 100 mg by mouth 2 (two) times daily.      pravastatin 20 MG tablet   Commonly known as: PRAVACHOL   Take 20 mg by mouth daily.      Vitamin D (Ergocalciferol) 50000 UNITS Caps   Commonly known as: DRISDOL   Take 50,000 Units by mouth every 7 (seven) days. Takes on Tuesday           Follow-up Information    Follow up with Evlyn Courier, MD. On 12/28/2012. (Pt already has appointment 12/28/12. recommend bmet to evaluate renal function as well as BP monitoring)    Contact information:   [address] [place] [address] [phone]           The results of significant diagnostics from this hospitalization (including imaging, microbiology, ancillary and laboratory) are listed below for reference.    Significant Diagnostic Studies: No results  found.  Microbiology: Recent Results (from the past 240 hour(s))  URINE CULTURE     Status: Normal   Collection Time   12/21/12 12:55 PM      Component Value Range Status Comment   Specimen Description URINE, CLEAN CATCH   Final    Special Requests NONE   Final    Culture  Setup Time 12/21/2012 22:20   Final    Colony Count  NO GROWTH   Final    Culture NO GROWTH   Final    Report Status 12/22/2012 FINAL   Final      Labs: Basic Metabolic Panel:  Lab 12/23/12 5409 12/22/12 0412 12/21/12 1125  NA 140 137 136  K 3.8 4.2 4.2  CL 112 114* 109  CO2 20 15* 17*  GLUCOSE 183* 262* 364*  BUN 26* 34* 39*  CREATININE 1.24* 1.47* 1.72*  CALCIUM 8.3* 8.3* 9.0  MG -- -- --  PHOS -- -- --   Liver Function Tests: No results found for this basename: AST:5,ALT:5,ALKPHOS:5,BILITOT:5,PROT:5,ALBUMIN:5 in the last 168 hours No results found for this basename: LIPASE:5,AMYLASE:5 in the last 168 hours No results found for this basename: AMMONIA:5 in the last 168 hours CBC:  Lab 12/22/12 0412 12/21/12 1125  WBC 4.5 3.7*  NEUTROABS -- 2.4  HGB 9.9* 10.7*  HCT 31.5* 34.0*  MCV 87.0 88.1  PLT 182 180   Cardiac Enzymes: No results found for this basename: CKTOTAL:5,CKMB:5,CKMBINDEX:5,TROPONINI:5 in the last 168 hours BNP: BNP (last 3 results) No results found for this basename: PROBNP:3 in the last 8760 hours CBG:  Lab 12/23/12 0733 12/22/12 2205 12/22/12 1724 12/22/12 1648 12/22/12 1234  GLUCAP 171* 242* 145* 122* 203*       Signed:  Toya Smothers M  [facility] 12/23/2012, 11:10 AM    Attending note:  Patient seen and examined on the day of discharge.  Agree with note as above.  She will need close follow up with PCP for management of diabetes and hypertension.  Dehydration is improved, as has renal function.

## 2012-12-23 NOTE — Care Management Note (Signed)
    Page 1 of 1   12/23/2012     1:35:34 PM   CARE MANAGEMENT NOTE 12/23/2012  Patient:  [name], [name]   Account Number:  [id number]  Date Initiated:  12/23/2012  Documentation initiated by:  Rosemary Holms  Subjective/Objective Assessment:   Pt admitted with aMS, UTI. Lives with daughter and will dc back to home. No needs identified for [facility]. Dietician to work with daughter regarding Diabetes.     Action/Plan:   Anticipated DC Date:  12/23/2012   Anticipated DC Plan:  HOME/SELF CARE  In-house referral  Nutrition      DC Planning Services  CM consult      Choice offered to / List presented to:             Status of service:  Completed, signed off Medicare Important Message given?   (If response is "NO", the following Medicare IM given date fields will be blank) Date Medicare IM given:   Date Additional Medicare IM given:    Discharge Disposition:  HOME/SELF CARE  Per UR Regulation:    If discussed at Long Length of Stay Meetings, dates discussed:    Comments:  12/23/12 Rosemary Holms RN BSN CM

## 2012-12-23 NOTE — Progress Notes (Signed)
  RD consulted for nutrition education regarding diabetes.   Lab Results  Component Value Date   HGBA1C 9.6* 12/21/2012    RD provided "Carbohydrate Counting for People with Diabetes" handout from the Academy of Nutrition and Dietetics. Discussed different food groups and their effects on blood sugar, emphasizing carbohydrate-containing foods. Provided list of carbohydrates and recommended serving sizes of common foods. Emphasized plate method.   Discussed importance of controlled and consistent carbohydrate intake throughout the day. Provided examples of ways to balance meals/snacks and encouraged intake of high-fiber, whole grain complex carbohydrates. Teach back method used.  Expect fair to poor compliance. Pt will need reinforcement on concepts. Daughter, Larene Beach, who is the primary caregiver for pt, was unavailable at time of visit. Pt reports Vickie prepares all patient meals. Provided information on Outpatient Group Diabetes Class at [facility] and encouraged pt and caregiver attendance. Pt may also benefit from outpatient individual RD counseling for reinforcement of concepts.   Body mass index is 26.52 kg/(m^2). Pt meets criteria for overweight based on current BMI.  Current diet order is carb modified, patient is consuming approximately 100% of meals at this time. Labs and medications reviewed. No further nutrition interventions warranted at this time. RD contact information provided. If additional nutrition issues arise, please re-consult RD.  Melody Haver, RD, LDN Pager: [phone]

## 2013-06-22 ENCOUNTER — Other Ambulatory Visit (HOSPITAL_COMMUNITY): Payer: Self-pay | Admitting: Nephrology

## 2013-06-22 DIAGNOSIS — N289 Disorder of kidney and ureter, unspecified: Secondary | ICD-10-CM

## 2013-07-11 ENCOUNTER — Ambulatory Visit (HOSPITAL_COMMUNITY)
Admission: RE | Admit: 2013-07-11 | Discharge: 2013-07-11 | Disposition: A | Discharge status: Home / Self Care | Payer: Medicare Other | Source: Ambulatory Visit | Attending: Nephrology | Admitting: Nephrology

## 2013-07-11 DIAGNOSIS — N289 Disorder of kidney and ureter, unspecified: Secondary | ICD-10-CM | POA: Insufficient documentation

## 2013-07-22 ENCOUNTER — Inpatient Hospital Stay (HOSPITAL_COMMUNITY)
Admission: EM | Admit: 2013-07-22 | Discharge: 2013-07-25 | DRG: 682 | Disposition: A | Discharge status: Home / Self Care | Payer: Medicare Other | Attending: Internal Medicine | Admitting: Internal Medicine

## 2013-07-22 ENCOUNTER — Encounter (HOSPITAL_COMMUNITY): Payer: Self-pay | Admitting: *Deleted

## 2013-07-22 ENCOUNTER — Emergency Department (HOSPITAL_COMMUNITY): Payer: Medicare Other

## 2013-07-22 DIAGNOSIS — D649 Anemia, unspecified: Secondary | ICD-10-CM | POA: Diagnosis present

## 2013-07-22 DIAGNOSIS — N183 Chronic kidney disease, stage 3 unspecified: Secondary | ICD-10-CM | POA: Diagnosis present

## 2013-07-22 DIAGNOSIS — R609 Edema, unspecified: Secondary | ICD-10-CM

## 2013-07-22 DIAGNOSIS — Z951 Presence of aortocoronary bypass graft: Secondary | ICD-10-CM

## 2013-07-22 DIAGNOSIS — Z87891 Personal history of nicotine dependence: Secondary | ICD-10-CM

## 2013-07-22 DIAGNOSIS — N179 Acute kidney failure, unspecified: Principal | ICD-10-CM

## 2013-07-22 DIAGNOSIS — I252 Old myocardial infarction: Secondary | ICD-10-CM

## 2013-07-22 DIAGNOSIS — Z833 Family history of diabetes mellitus: Secondary | ICD-10-CM

## 2013-07-22 DIAGNOSIS — N184 Chronic kidney disease, stage 4 (severe): Secondary | ICD-10-CM | POA: Diagnosis present

## 2013-07-22 DIAGNOSIS — N19 Unspecified kidney failure: Secondary | ICD-10-CM

## 2013-07-22 DIAGNOSIS — E872 Acidosis, unspecified: Secondary | ICD-10-CM | POA: Diagnosis present

## 2013-07-22 DIAGNOSIS — R600 Localized edema: Secondary | ICD-10-CM | POA: Diagnosis present

## 2013-07-22 DIAGNOSIS — I5023 Acute on chronic systolic (congestive) heart failure: Secondary | ICD-10-CM | POA: Diagnosis present

## 2013-07-22 DIAGNOSIS — Z823 Family history of stroke: Secondary | ICD-10-CM

## 2013-07-22 DIAGNOSIS — I509 Heart failure, unspecified: Secondary | ICD-10-CM | POA: Diagnosis present

## 2013-07-22 DIAGNOSIS — E119 Type 2 diabetes mellitus without complications: Secondary | ICD-10-CM | POA: Diagnosis present

## 2013-07-22 DIAGNOSIS — I129 Hypertensive chronic kidney disease with stage 1 through stage 4 chronic kidney disease, or unspecified chronic kidney disease: Secondary | ICD-10-CM | POA: Diagnosis present

## 2013-07-22 DIAGNOSIS — G40909 Epilepsy, unspecified, not intractable, without status epilepticus: Secondary | ICD-10-CM | POA: Diagnosis present

## 2013-07-22 DIAGNOSIS — I10 Essential (primary) hypertension: Secondary | ICD-10-CM | POA: Diagnosis present

## 2013-07-22 DIAGNOSIS — Z79899 Other long term (current) drug therapy: Secondary | ICD-10-CM

## 2013-07-22 DIAGNOSIS — E876 Hypokalemia: Secondary | ICD-10-CM | POA: Diagnosis present

## 2013-07-22 DIAGNOSIS — N189 Chronic kidney disease, unspecified: Secondary | ICD-10-CM

## 2013-07-22 DIAGNOSIS — Z8673 Personal history of transient ischemic attack (TIA), and cerebral infarction without residual deficits: Secondary | ICD-10-CM

## 2013-07-22 HISTORY — DX: Epilepsy, unspecified, not intractable, without status epilepticus: G40.909

## 2013-07-22 LAB — BASIC METABOLIC PANEL
Calcium: 8.2 mg/dL — ABNORMAL LOW (ref 8.4–10.5)
Creatinine, Ser: 2.5 mg/dL — ABNORMAL HIGH (ref 0.50–1.10)
GFR calc Af Amer: 21 mL/min — ABNORMAL LOW (ref >90)
GFR calc non Af Amer: 18 mL/min — ABNORMAL LOW (ref >90)
Sodium: 143 mEq/L (ref 135–145)

## 2013-07-22 LAB — CBC WITH DIFFERENTIAL/PLATELET
Basophils Absolute: 0 10*3/uL (ref 0.0–0.1)
Basophils Relative: 1 % (ref 0–1)
Eosinophils Absolute: 0 10*3/uL (ref 0.0–0.7)
Eosinophils Relative: 1 % (ref 0–5)
Lymphocytes Relative: 17 % (ref 12–46)
MCH: 26.7 pg (ref 26.0–34.0)
MCHC: 31.2 g/dL (ref 30.0–36.0)
MCV: 85.7 fL (ref 78.0–100.0)
Monocytes Absolute: 0.4 10*3/uL (ref 0.1–1.0)
Platelets: 232 10*3/uL (ref 150–400)
RDW: 17.1 % — ABNORMAL HIGH (ref 11.5–15.5)
WBC: 4.9 10*3/uL (ref 4.0–10.5)

## 2013-07-22 LAB — URINALYSIS, ROUTINE W REFLEX MICROSCOPIC
Glucose, UA: NEGATIVE mg/dL
Leukocytes, UA: NEGATIVE
Protein, ur: 100 mg/dL — AB
Specific Gravity, Urine: 1.02 (ref 1.005–1.030)
pH: 6 (ref 5.0–8.0)

## 2013-07-22 LAB — GLUCOSE, CAPILLARY: Glucose-Capillary: 100 mg/dL — ABNORMAL HIGH (ref 70–99)

## 2013-07-22 LAB — URINE MICROSCOPIC-ADD ON

## 2013-07-22 MED ORDER — ONDANSETRON HCL 4 MG/2ML IJ SOLN: 4.0000 mg | Freq: Four times a day (QID) | INTRAMUSCULAR | Status: DC | PRN

## 2013-07-22 MED ORDER — SODIUM CHLORIDE 0.9 % IV SOLN
INTRAVENOUS | Status: DC
  Administered 2013-07-22: 1000 mL via INTRAVENOUS

## 2013-07-22 MED ORDER — ASPIRIN-DIPYRIDAMOLE ER 25-200 MG PO CP12
1.0000 | ORAL_CAPSULE | Freq: Two times a day (BID) | ORAL | Status: DC
  Administered 2013-07-22 – 2013-07-25 (×6): 1 via ORAL
  Filled 2013-07-22 (×10): qty 1

## 2013-07-22 MED ORDER — DONEPEZIL HCL 5 MG PO TABS
5.0000 mg | ORAL_TABLET | Freq: Every day | ORAL | Status: DC
  Administered 2013-07-22 – 2013-07-25 (×4): 5 mg via ORAL
  Filled 2013-07-22 (×4): qty 1

## 2013-07-22 MED ORDER — ACETAMINOPHEN 650 MG RE SUPP: 650.0000 mg | Freq: Four times a day (QID) | RECTAL | Status: DC | PRN

## 2013-07-22 MED ORDER — METOPROLOL SUCCINATE ER 50 MG PO TB24
50.0000 mg | ORAL_TABLET | Freq: Every day | ORAL | Status: DC
  Administered 2013-07-22 – 2013-07-25 (×4): 50 mg via ORAL
  Filled 2013-07-22 (×4): qty 1

## 2013-07-22 MED ORDER — SODIUM CHLORIDE 0.9 % IJ SOLN: 3.0000 mL | Freq: Two times a day (BID) | INTRAMUSCULAR | Status: DC

## 2013-07-22 MED ORDER — SIMVASTATIN 10 MG PO TABS
5.0000 mg | ORAL_TABLET | Freq: Every day | ORAL | Status: DC
  Administered 2013-07-22 – 2013-07-24 (×3): 5 mg via ORAL
  Filled 2013-07-22 (×3): qty 1

## 2013-07-22 MED ORDER — FUROSEMIDE 10 MG/ML IJ SOLN
40.0000 mg | Freq: Two times a day (BID) | INTRAMUSCULAR | Status: DC
  Administered 2013-07-22 – 2013-07-25 (×6): 40 mg via INTRAVENOUS
  Filled 2013-07-22 (×6): qty 4

## 2013-07-22 MED ORDER — INSULIN ASPART 100 UNIT/ML ~~LOC~~ SOLN
0.0000 [IU] | Freq: Three times a day (TID) | SUBCUTANEOUS | Status: DC
  Administered 2013-07-23: 2 [IU] via SUBCUTANEOUS
  Administered 2013-07-23: 3 [IU] via SUBCUTANEOUS
  Administered 2013-07-23: 2 [IU] via SUBCUTANEOUS
  Administered 2013-07-24 (×2): 3 [IU] via SUBCUTANEOUS
  Administered 2013-07-24: 5 [IU] via SUBCUTANEOUS
  Administered 2013-07-25: 8 [IU] via SUBCUTANEOUS
  Administered 2013-07-25: 2 [IU] via SUBCUTANEOUS

## 2013-07-22 MED ORDER — FUROSEMIDE 10 MG/ML IJ SOLN: 40.0000 mg | Freq: Two times a day (BID) | INTRAMUSCULAR | Status: DC

## 2013-07-22 MED ORDER — BISACODYL 10 MG RE SUPP
10.0000 mg | Freq: Every day | RECTAL | Status: DC | PRN
  Administered 2013-07-23: 10 mg via RECTAL
  Filled 2013-07-22: qty 1

## 2013-07-22 MED ORDER — SENNA 8.6 MG PO TABS
1.0000 | ORAL_TABLET | Freq: Two times a day (BID) | ORAL | Status: DC
  Administered 2013-07-22 – 2013-07-25 (×4): 8.6 mg via ORAL
  Filled 2013-07-22 (×4): qty 1

## 2013-07-22 MED ORDER — HYDROCODONE-ACETAMINOPHEN 5-325 MG PO TABS: 1.0000 | ORAL_TABLET | ORAL | Status: DC | PRN

## 2013-07-22 MED ORDER — ONDANSETRON HCL 4 MG PO TABS: 4.0000 mg | ORAL_TABLET | Freq: Four times a day (QID) | ORAL | Status: DC | PRN

## 2013-07-22 MED ORDER — FLEET ENEMA 7-19 GM/118ML RE ENEM: 1.0000 | ENEMA | Freq: Once | RECTAL | Status: AC | PRN

## 2013-07-22 MED ORDER — ASPIRIN 325 MG PO TABS
325.0000 mg | ORAL_TABLET | Freq: Every day | ORAL | Status: DC
Start: 2013-07-22 — End: 2013-07-25
  Administered 2013-07-22 – 2013-07-25 (×4): 325 mg via ORAL
  Filled 2013-07-22 (×4): qty 1

## 2013-07-22 MED ORDER — HYDRALAZINE HCL 25 MG PO TABS
50.0000 mg | ORAL_TABLET | Freq: Three times a day (TID) | ORAL | Status: DC
  Administered 2013-07-22 – 2013-07-25 (×9): 50 mg via ORAL
  Filled 2013-07-22 (×10): qty 2

## 2013-07-22 MED ORDER — ACETAMINOPHEN 325 MG PO TABS: 650.0000 mg | ORAL_TABLET | Freq: Four times a day (QID) | ORAL | Status: DC | PRN

## 2013-07-22 NOTE — Progress Notes (Signed)
Skin assessment verified with Sherrye Payor, RN & MB Juanetta Gosling, RN.  Slight redness noted under breast, few small scabs to chest - appears to be healing rash.  Excoriated peri area, cleansed and patient able to turn self frequently - daughter at bedside states she is unsure how patient got excoriated.  Mass noted to upper R shoulder - daughter states MD has evaluated this in past, and was nothing of concern and from unknown etiology.

## 2013-07-22 NOTE — H&P (Signed)
[facility] History and Physical  [name] NUU:[id number] DOB: 02/05/1941 DOA: 07/22/2013  Referring physician:  PCP: Evlyn Courier, MD  Specialists:   Chief Complaint:   HPI: [name] is a 72 y.o. female with a past medical history of diabetes, hypertension, stroke and 2012, seizure disorder, chronic kidney disease stage III presents to the emergency room with a chief complaint of lower extremity edema. Information is obtained mostly from the daughter per telephone with whom the patient resides as patient does have short-term memory issues and information from her is unreliable. Daughter reports that this morning when bathing her mother she noticed that her mother's feet were swollen. She states that this was the first time she noticed her mother's feet swelling. In addition she also noticed that her mother's face was "puffy". She also indicates some worsening shortness of breath and cough. She indicates that she went to her primary care provider at this time and she was given some medicine for "a cold". The daughter believes it was an antibiotic in addition she was given sodium bicarbonate. The patient denies any chest pain palpitations headache dizziness weakness anorexia. The daughter indicates that there has been no change in the patient's appetite or weight. She states the patient has not complained of any pain or discomfort. The daughter does report that the patient was seen by renal specialist and a renal ultrasound was done on August 18. She is unaware of the results and the patient has a followup appointment with Dr. Kristian Covey next week. Lab work in the emergency room significant for chloride of 113, CO2 18, BUN 46, creatinine 2.50, glucose 166 , hemoglobin 8.2, proBNP 27,695. Chest x-ray yields borderline cardiac enlargement and mild vascular congestion. Streaky basilar scarring changes but no infiltrates or effusions. Renal ultrasound done 11 days ago yields no acute findings.  Bilateral echogenic kidneys compatible with chronic medical  renal disease. Vital signs significant for a blood pressure of 197/73. Symptoms came on suddenly have persisted characterized as moderate. [facility] are asked to admit.     Review of Systems: 13 point review of systems reviewed and all systems negative except as noted in history of present illness  Past Medical History  Diagnosis Date  . Diabetes mellitus   . Hypertension   . Stroke   . MI, old   . Renal disorder   . Seizure disorder    Past Surgical History  Procedure Laterality Date  . Coronary artery bypass graft    . Tee without cardioversion  01/28/2012    Procedure: TRANSESOPHAGEAL ECHOCARDIOGRAM (TEE);  Surgeon: Lewayne Bunting, MD;  Location: [facility] ENDOSCOPY;  Service: Cardiovascular;  Laterality: [name];   Social History:  reports that she has quit smoking. Her smoking use included Cigarettes. She has a 50 pack-year smoking history. She does not have any smokeless tobacco history on file. She reports that she does not drink alcohol or use illicit drugs. No Known Allergies   Patient lives at home with her daughter. She uses a walker for ambulation. She is independent with ADLs however she does not drive or leave the house much appear  Family History  Problem Relation Age of Onset  . Stroke Father   . Diabetes type II Mother     Prior to Admission medications   Medication Sig Start Date End Date Taking? Authorizing Provider  aspirin 325 MG tablet Take 325 mg by mouth daily.   Yes Historical Provider, MD  dipyridamole-aspirin (AGGRENOX) 25-200 MG per 12 hr  capsule Take 1 capsule by mouth 2 (two) times daily.   Yes Historical Provider, MD  donepezil (ARICEPT) 5 MG tablet Take 5 mg by mouth daily.   Yes Historical Provider, MD  folic acid (FOLVITE) 1 MG tablet Take 1 mg by mouth daily.   Yes Historical Provider, MD  glyBURIDE (DIABETA) 2.5 MG tablet Take 2.5 mg by mouth daily with breakfast.   Yes Historical  Provider, MD  hydrALAZINE (APRESOLINE) 50 MG tablet Take 1 tablet (50 mg total) by mouth every 8 (eight) hours. 12/23/12  Yes Lesle Chris Black, NP  metoprolol (TOPROL-XL) 100 MG 24 hr tablet Take 50 mg by mouth daily.    Yes Historical Provider, MD  pravastatin (PRAVACHOL) 20 MG tablet Take 20 mg by mouth daily.     Yes Historical Provider, MD  sodium bicarbonate 650 MG tablet Take 650 mg by mouth 3 (three) times daily.   Yes Historical Provider, MD  Vitamin D, Ergocalciferol, (DRISDOL) 50000 UNITS CAPS Take 50,000 Units by mouth every 7 (seven) days. Takes on Tuesday   Yes Historical Provider, MD   Physical Exam: Filed Vitals:   07/22/13 1335  BP: 151/82  Pulse: 70  Temp: 98.3 F (36.8 C)  Resp: 20     General:  Well-nourished no acute distress  Eyes: PERRLA, EOMI, no scleral icterus  ENT: Ears clear nose without drainage oropharynx without erythema or exudate. Mucous membranes of her mouth are pink slightly dry. Very poor dentition  Neck: Supple no lymphadenopathy mild increased venous pressure  Cardiovascular: Regular rate and rhythm no murmur no gallop no rub trace to 1+ lower extremity edema in her feet bilaterally  Respiratory: Mild increased work of breathing with conversation. Breath sounds clear bilaterally no wheezes no crackles  Abdomen: Obese soft positive bowel sounds nontender to palpation no mass or organomegaly noted  Skin: Warm and dry no rash no lesions  Musculoskeletal: Moves all extremities joints without swelling or erythema  Psychiatric: Calm cooperative  Neurologic: Oriented to person and place. Difficulty with short-term memory. Cranial nerves II through XII grossly intact. Speech slow but clear facial symmetry  Labs on Admission:  Basic Metabolic Panel:  Recent Labs Lab 07/22/13 1420  NA 143  K 3.6  CL 113*  CO2 18*  GLUCOSE 166*  BUN 46*  CREATININE 2.50*  CALCIUM 8.2*   Liver Function Tests: No results found for this basename: AST, ALT,  ALKPHOS, BILITOT, PROT, ALBUMIN,  in the last 168 hours No results found for this basename: LIPASE, AMYLASE,  in the last 168 hours No results found for this basename: AMMONIA,  in the last 168 hours CBC:  Recent Labs Lab 07/22/13 1420  WBC 4.9  NEUTROABS 3.6  HGB 8.2*  HCT 26.3*  MCV 85.7  PLT 232   Cardiac Enzymes: No results found for this basename: CKTOTAL, CKMB, CKMBINDEX, TROPONINI,  in the last 168 hours  BNP (last 3 results)  Recent Labs  07/22/13 1420  PROBNP 27695.0*   CBG: No results found for this basename: GLUCAP,  in the last 168 hours  Radiological Exams on Admission: Dg Chest 2 View  07/22/2013   *RADIOLOGY REPORT*  Clinical Data: Hypertension.  Ex-smoker.  CHEST - 2 VIEW  Comparison: 09/03/2012.  Findings: The heart is borderline enlarged but stable.  The mediastinal and hilar contours are unchanged.  Mild tortuosity of the thoracic aorta.  Stable surgical changes from bypass surgery. The lungs are clear.  Mild central vascular congestion and streaky basilar scarring  changes but no edema or effusions.  The bony thorax is intact.  IMPRESSION: Borderline cardiac enlargement and mild vascular congestion. Streaky basilar scarring changes but no infiltrates or effusions.   Original Report Authenticated By: Rudie Meyer, M.D.    EKG: Pending  Assessment/Plan Principal Problem:   Acute on chronic renal failure: Will admit to telemetry. Patient with history of chronic kidney disease stage III. Currently under the care of Dr. Kristian Covey. Will obtain urinalysis. Will monitor intake and output. Active Problems: Bilateral lower extremity edema: Etiology uncertain.  Likely multi-factorial. Possible pulmonary edema in the setting of #1. Will obtain 2-D echo, cycle cardiac enzymes and TSH, check her EKG. Will provide IV Lasix 40 mg every 12. Daily weights and intake and output.    Anemia: Likely of chronic disease. Current level of 8.2 appears to be below her baseline range  of 9-10. May be diluted. Check anemia panel. No signs symptoms of active bleeding. Of note patient is on folic acid at home.    Hypertension: Controlled. Continue her home beta blocker as well as her hydralazine.    Seizure disorder: Daughter reports one seizure 2 years ago when patient had her stroke. She believes that the patient is on medication. Review does not indicate this. Will ask pharmacy to follow    CKD (chronic kidney disease) stage 3, GFR 30-59 ml/min: See #1    Diabetes: We'll check hemoglobin A1c. Will hold her home diabetic for now and use sliding scale insulin for glycemic control. Provide car modified diet  History of stroke. 2 years ago. Patient on Aggrenox and aspirin. Will continue. Also continue her statin.     Code Status: full Family Communication: daughter per phone Disposition Plan: home with daughter when ready  Time spent: 75 minutes  Gwenyth Bender [facility] Pager [phone]  If 7PM-7AM, please contact night-coverage [email or web address] Password [facility] 07/22/2013, 4:18 PM  Attending note:  Patient seen and examined. Above note reviewed and agree.  She's been admitted with increasing peripheral edema. She is acute on chronic renal failure. She does have a history of chronic systolic dysfunction. Urine does not show significant proteinuria. This may be related to acute on chronic systolic congestive heart failure. She's been started on IV diuretics. Her renal failure may be due to decreased cardiac output. We will repeat echocardiogram. Monitor renal function with diuresis. Check TSH and cardiac markers. Monitor volume status closely.  Philo Kurtz

## 2013-07-22 NOTE — ED Notes (Signed)
Feet swelling, has been evaluated for kidney problems lately

## 2013-07-22 NOTE — ED Provider Notes (Signed)
CSN: [id number]     Arrival date & time 07/22/13  1326 History   First MD Initiated Contact with Patient 07/22/13 1347     Chief Complaint  Patient presents with  . Leg Swelling   (Consider location/radiation/quality/duration/timing/severity/associated sxs/prior Treatment) The history is provided by the patient.   patient here for increased feet swelling x2 days with some associated dyspnea. Patient denies anginal type chest pain. Denies any orthopnea but has had possible dyspnea on exertion. Called her Dr. was told to come here for further evaluation. Does have a history of renal disease according to her family member. Denies any recent fever, cough, vomiting, diarrhea.. Patient seen by daily Dr. recently and placed on sodium bicarbonate tablets and her daughter thinks that that might be the cause.  Past Medical History  Diagnosis Date  . Diabetes mellitus   . Hypertension   . Stroke   . MI, old   . Renal disorder    Past Surgical History  Procedure Laterality Date  . Coronary artery bypass graft    . Tee without cardioversion  01/28/2012    Procedure: TRANSESOPHAGEAL ECHOCARDIOGRAM (TEE);  Surgeon: Lewayne Bunting, MD;  Location: [facility] ENDOSCOPY;  Service: Cardiovascular;  Laterality: [name];   Family History  Problem Relation Age of Onset  . Stroke Father   . Diabetes type II Mother    History  Substance Use Topics  . Smoking status: Former Smoker -- 1.00 packs/day for 50 years    Types: Cigarettes  . Smokeless tobacco: Not on file  . Alcohol Use: No   OB History   Grav Para Term Preterm Abortions TAB SAB Ect Mult Living                 Review of Systems  All other systems reviewed and are negative.    Allergies  Review of patient's allergies indicates no known allergies.  Home Medications   Current Outpatient Rx  Name  Route  Sig  Dispense  Refill  . aspirin 325 MG tablet   Oral   Take 325 mg by mouth daily.         Marland Kitchen dipyridamole-aspirin (AGGRENOX) 25-200 MG  per 12 hr capsule   Oral   Take 1 capsule by mouth 2 (two) times daily.         Marland Kitchen donepezil (ARICEPT) 5 MG tablet   Oral   Take 5 mg by mouth daily.         . folic acid (FOLVITE) 1 MG tablet   Oral   Take 1 mg by mouth daily.         Marland Kitchen glyBURIDE (DIABETA) 2.5 MG tablet   Oral   Take 2.5 mg by mouth daily with breakfast.         . hydrALAZINE (APRESOLINE) 50 MG tablet   Oral   Take 1 tablet (50 mg total) by mouth every 8 (eight) hours.   90 tablet   0   . metoprolol (TOPROL-XL) 100 MG 24 hr tablet   Oral   Take 50 mg by mouth daily.          . pravastatin (PRAVACHOL) 20 MG tablet   Oral   Take 20 mg by mouth daily.           . sodium bicarbonate 650 MG tablet   Oral   Take 650 mg by mouth 3 (three) times daily.         . Vitamin D, Ergocalciferol, (DRISDOL) 50000 UNITS CAPS  Oral   Take 50,000 Units by mouth every 7 (seven) days. Takes on Tuesday          BP 151/82  Pulse 70  Temp(Src) 98.3 F (36.8 C) (Oral)  Resp 20  Ht 5\' 5"  (1.651 m)  Wt 151 lb (68.493 kg)  BMI 25.13 kg/m2  SpO2 99% Physical Exam  Nursing note and vitals reviewed. Constitutional: She is oriented to person, place, and time. She appears well-developed and well-nourished.  Non-toxic appearance. No distress.  HENT:  Head: Normocephalic and atraumatic.  Eyes: Conjunctivae, EOM and lids are normal. Pupils are equal, round, and reactive to light.  Neck: Normal range of motion. Neck supple. No tracheal deviation present. No mass present.  Cardiovascular: Normal rate, regular rhythm and normal heart sounds.  Exam reveals no gallop.   No murmur heard. Pulmonary/Chest: Effort normal and breath sounds normal. No stridor. No respiratory distress. She has no decreased breath sounds. She has no wheezes. She has no rhonchi. She has no rales.  Abdominal: Soft. Normal appearance and bowel sounds are normal. She exhibits no distension. There is no tenderness. There is no rebound and no CVA  tenderness.  Musculoskeletal: Normal range of motion. She exhibits no edema and no tenderness.  1 plus bilateral LE edema  Neurological: She is alert and oriented to person, place, and time. She has normal strength. No cranial nerve deficit or sensory deficit. GCS eye subscore is 4. GCS verbal subscore is 5. GCS motor subscore is 6.  Skin: Skin is warm and dry. No abrasion and no rash noted.  Psychiatric: She has a normal mood and affect. Her speech is normal and behavior is normal.    ED Course  Procedures (including critical care time) Labs Review Labs Reviewed  PRO B NATRIURETIC PEPTIDE  CBC WITH DIFFERENTIAL  BASIC METABOLIC PANEL   Imaging Review No results found.  MDM  No diagnosis found. Patient to be admitted for evaluation of renal failure    Toy Baker, MD 07/22/13 1534

## 2013-07-23 DIAGNOSIS — E119 Type 2 diabetes mellitus without complications: Secondary | ICD-10-CM

## 2013-07-23 LAB — GLUCOSE, CAPILLARY
Glucose-Capillary: 128 mg/dL — ABNORMAL HIGH (ref 70–99)
Glucose-Capillary: 209 mg/dL — ABNORMAL HIGH (ref 70–99)

## 2013-07-23 LAB — BASIC METABOLIC PANEL
CO2: 19 mEq/L (ref 19–32)
Calcium: 8.3 mg/dL — ABNORMAL LOW (ref 8.4–10.5)
Glucose, Bld: 133 mg/dL — ABNORMAL HIGH (ref 70–99)
Potassium: 3.1 mEq/L — ABNORMAL LOW (ref 3.5–5.1)
Sodium: 143 mEq/L (ref 135–145)

## 2013-07-23 LAB — CBC
Hemoglobin: 8.3 g/dL — ABNORMAL LOW (ref 12.0–15.0)
MCH: 26.8 pg (ref 26.0–34.0)
MCV: 85.2 fL (ref 78.0–100.0)
Platelets: 229 10*3/uL (ref 150–400)
RBC: 3.1 MIL/uL — ABNORMAL LOW (ref 3.87–5.11)
WBC: 4.1 10*3/uL (ref 4.0–10.5)

## 2013-07-23 MED ORDER — MUPIROCIN 2 % EX OINT
TOPICAL_OINTMENT | Freq: Two times a day (BID) | CUTANEOUS | Status: DC
  Administered 2013-07-23 – 2013-07-25 (×5): via TOPICAL
  Filled 2013-07-23 (×2): qty 22

## 2013-07-23 MED ORDER — HEPARIN SODIUM (PORCINE) 5000 UNIT/ML IJ SOLN
5000.0000 [IU] | Freq: Three times a day (TID) | INTRAMUSCULAR | Status: DC
  Administered 2013-07-23 – 2013-07-25 (×6): 5000 [IU] via SUBCUTANEOUS
  Filled 2013-07-23 (×7): qty 1

## 2013-07-23 MED ORDER — POTASSIUM CHLORIDE CRYS ER 20 MEQ PO TBCR
40.0000 meq | EXTENDED_RELEASE_TABLET | Freq: Once | ORAL | Status: AC
  Administered 2013-07-23: 40 meq via ORAL
  Filled 2013-07-23: qty 2

## 2013-07-23 NOTE — Progress Notes (Signed)
[facility] PROGRESS NOTE  [name] ZOX:[id number] DOB: 04-19-1941 DOA: 07/22/2013 PCP: Evlyn Courier, MD  Assessment/Plan: Acute on chronic renal failure:  Urine output has been adequate.  Creatinine is stable.  Will continue current dosing of lasix. Continue to monitor renal function.   Bilateral lower extremity edema: urine does not show significant proteinuria.  Echo is pending.  Likely related to acute on chronic systolic CHF, vs. Worsening renal failure.   Anemia: Likely of chronic disease/renal disease. Hemoglobin appears to be stable. No signs symptoms of active bleeding. Of note patient is on folic acid at home.   Hypertension: Controlled. Continue her home beta blocker as well as her hydralazine.   Seizure disorder: Daughter reports one seizure 2 years ago when patient had her stroke. She believes that the patient is on medication. Review does not indicate this. Will ask pharmacy to follow   CKD (chronic kidney disease) stage 3, GFR 30-59 ml/min: See #1   Diabetes: Will hold her home diabetic for now and use sliding scale insulin for glycemic control. Provide car modified diet   History of stroke. 2 years ago. Patient on Aggrenox and aspirin. Will continue. Also continue her statin.   Code Status: full code Family Communication: discussed with patient and family members at the bedside Disposition Plan: discharge home once improved   Consultants:  none  Procedures:  none  Antibiotics:  none  HPI/Subjective: No complaints, feeling better  Objective: Filed Vitals:   07/23/13 1341  BP: 136/64  Pulse: 73  Temp: 98.4 F (36.9 C)  Resp: 18    Intake/Output Summary (Last 24 hours) at 07/23/13 1719 Last data filed at 07/23/13 1500  Gross per 24 hour  Intake   1081 ml  Output   2625 ml  Net  -1544 ml   Filed Weights   07/22/13 1333 07/22/13 1625 07/23/13 0603  Weight: 68.493 kg (151 lb) 74.39 kg (164 lb) 73.165 kg (161 lb 4.8 oz)     Exam:   General:  NAD  Cardiovascular: s1, s2, rrr  Respiratory: crackles at bases b/l  Abdomen: soft, nt, nd, bs+  Musculoskeletal: 1+ edema b/l   Data Reviewed: Basic Metabolic Panel:  Recent Labs Lab 07/22/13 1420 07/23/13 0628  NA 143 143  K 3.6 3.1*  CL 113* 112  CO2 18* 19  GLUCOSE 166* 133*  BUN 46* 43*  CREATININE 2.50* 2.39*  CALCIUM 8.2* 8.3*   Liver Function Tests: No results found for this basename: AST, ALT, ALKPHOS, BILITOT, PROT, ALBUMIN,  in the last 168 hours No results found for this basename: LIPASE, AMYLASE,  in the last 168 hours No results found for this basename: AMMONIA,  in the last 168 hours CBC:  Recent Labs Lab 07/22/13 1420 07/23/13 0628  WBC 4.9 4.1  NEUTROABS 3.6  --   HGB 8.2* 8.3*  HCT 26.3* 26.4*  MCV 85.7 85.2  PLT 232 229   Cardiac Enzymes:  Recent Labs Lab 07/22/13 1420  TROPONINI <0.30   BNP (last 3 results)  Recent Labs  07/22/13 1420  PROBNP 27695.0*   CBG:  Recent Labs Lab 07/22/13 1657 07/22/13 2120 07/23/13 0723 07/23/13 1114 07/23/13 1659  GLUCAP 100* 185* 147* 189* 128*    No results found for this or any previous visit (from the past 240 hour(s)).   Studies: Dg Chest 2 View  07/22/2013   *RADIOLOGY REPORT*  Clinical Data: Hypertension.  Ex-smoker.  CHEST - 2 VIEW  Comparison: 09/03/2012.  Findings: The  heart is borderline enlarged but stable.  The mediastinal and hilar contours are unchanged.  Mild tortuosity of the thoracic aorta.  Stable surgical changes from bypass surgery. The lungs are clear.  Mild central vascular congestion and streaky basilar scarring changes but no edema or effusions.  The bony thorax is intact.  IMPRESSION: Borderline cardiac enlargement and mild vascular congestion. Streaky basilar scarring changes but no infiltrates or effusions.   Original Report Authenticated By: Rudie Meyer, M.D.    Scheduled Meds: . aspirin  325 mg Oral Daily  . dipyridamole-aspirin  1  capsule Oral BID  . donepezil  5 mg Oral Daily  . furosemide  40 mg Intravenous Q12H  . heparin subcutaneous  5,000 Units Subcutaneous Q8H  . hydrALAZINE  50 mg Oral Q8H  . insulin aspart  0-15 Units Subcutaneous TID WC  . metoprolol succinate  50 mg Oral Daily  . mupirocin ointment   Topical BID  . potassium chloride  40 mEq Oral Once  . senna  1 tablet Oral BID  . simvastatin  5 mg Oral q1800  . sodium chloride  3 mL Intravenous Q12H   Continuous Infusions: . sodium chloride 1,000 mL (07/22/13 1557)    Principal Problem:   Acute on chronic renal failure Active Problems:   Hypertension   Seizure disorder   CKD (chronic kidney disease) stage 3, GFR 30-59 ml/min   Diabetes   Bilateral lower extremity edema   Anemia    Time spent:    [facility]  [facility] Pager [phone]. If 7PM-7AM, please contact night-coverage at [email or web address], password [facility] 07/23/2013, 5:19 PM  LOS: 1 day

## 2013-07-24 LAB — BASIC METABOLIC PANEL
CO2: 17 mEq/L — ABNORMAL LOW (ref 19–32)
GFR calc non Af Amer: 21 mL/min — ABNORMAL LOW (ref >90)
Glucose, Bld: 157 mg/dL — ABNORMAL HIGH (ref 70–99)
Potassium: 3.8 mEq/L (ref 3.5–5.1)
Sodium: 137 mEq/L (ref 135–145)

## 2013-07-24 LAB — GLUCOSE, CAPILLARY: Glucose-Capillary: 161 mg/dL — ABNORMAL HIGH (ref 70–99)

## 2013-07-24 MED ORDER — SIMETHICONE 80 MG PO CHEW
80.0000 mg | CHEWABLE_TABLET | Freq: Four times a day (QID) | ORAL | Status: DC | PRN
  Administered 2013-07-24: 80 mg via ORAL
  Filled 2013-07-24: qty 1

## 2013-07-24 NOTE — Progress Notes (Signed)
[facility] PROGRESS NOTE  [name] ZOX:[id number] DOB: [birth year] DOA: 07/22/2013 PCP: Evlyn Courier, MD  Assessment/Plan: Acute on chronic renal failure:  Urine output has been adequate.  Creatinine is improving.  Will continue current dosing of lasix. Continue to monitor renal function.  Metabolic acidosis. Likely related to renal failure. Will likely need to be discharged on oral bicarbonate   Bilateral lower extremity edema: urine does not show significant proteinuria.  Echo is pending.  Likely related to acute on chronic systolic CHF, vs. Worsening renal failure.   Anemia: Likely of chronic disease/renal disease. Hemoglobin appears to be stable. No signs symptoms of active bleeding. Of note patient is on folic acid at home.   Hypertension: Controlled. Continue her home beta blocker as well as her hydralazine.   Seizure disorder: Daughter reports one seizure 2 years ago when patient had her stroke. She believes that the patient is on medication. Review does not indicate this. Will ask pharmacy to follow   CKD (chronic kidney disease) stage 3, GFR 30-59 ml/min: See #1   Diabetes: Will hold her home diabetic for now and use sliding scale insulin for glycemic control. Provide car modified diet   History of stroke. 2 years ago. Patient on Aggrenox and aspirin. Will continue. Also continue her statin.   Code Status: full code Family Communication: discussed daughter over the phone Disposition Plan: discharge home once improved, possibly tomorrow   Consultants:  none  Procedures:  none  Antibiotics:  none  HPI/Subjective: No complaints, feeling better  Objective: Filed Vitals:   07/24/13 1415  BP: 177/59  Pulse: 81  Temp: 98 F (36.7 C)  Resp: 18    Intake/Output Summary (Last 24 hours) at 07/24/13 1739 Last data filed at 07/24/13 1200  Gross per 24 hour  Intake    840 ml  Output   2425 ml  Net  -1585 ml   Filed Weights   07/22/13 1625  07/23/13 0603 07/24/13 0540  Weight: 74.39 kg (164 lb) 73.165 kg (161 lb 4.8 oz) 71.169 kg (156 lb 14.4 oz)    Exam:   General:  NAD  Cardiovascular: s1, s2, rrr  Respiratory: cta b  Abdomen: soft, nt, nd, bs+  Musculoskeletal: trace edema b/l  Data Reviewed: Basic Metabolic Panel:  Recent Labs Lab 07/22/13 1420 07/23/13 0628 07/24/13 0632  NA 143 143 137  K 3.6 3.1* 3.8  CL 113* 112 106  CO2 18* 19 17*  GLUCOSE 166* 133* 157*  BUN 46* 43* 39*  CREATININE 2.50* 2.39* 2.19*  CALCIUM 8.2* 8.3* 8.5   Liver Function Tests: No results found for this basename: AST, ALT, ALKPHOS, BILITOT, PROT, ALBUMIN,  in the last 168 hours No results found for this basename: LIPASE, AMYLASE,  in the last 168 hours No results found for this basename: AMMONIA,  in the last 168 hours CBC:  Recent Labs Lab 07/22/13 1420 07/23/13 0628  WBC 4.9 4.1  NEUTROABS 3.6  --   HGB 8.2* 8.3*  HCT 26.3* 26.4*  MCV 85.7 85.2  PLT 232 229   Cardiac Enzymes:  Recent Labs Lab 07/22/13 1420  TROPONINI <0.30   BNP (last 3 results)  Recent Labs  07/22/13 1420  PROBNP 27695.0*   CBG:  Recent Labs Lab 07/23/13 1659 07/23/13 2121 07/24/13 0721 07/24/13 1121 07/24/13 1625  GLUCAP 128* 209* 161* 237* 177*    No results found for this or any previous visit (from the past 240 hour(s)).   Studies: No results  found.  Scheduled Meds: . aspirin  325 mg Oral Daily  . dipyridamole-aspirin  1 capsule Oral BID  . donepezil  5 mg Oral Daily  . furosemide  40 mg Intravenous Q12H  . heparin subcutaneous  5,000 Units Subcutaneous Q8H  . hydrALAZINE  50 mg Oral Q8H  . insulin aspart  0-15 Units Subcutaneous TID WC  . metoprolol succinate  50 mg Oral Daily  . mupirocin ointment   Topical BID  . senna  1 tablet Oral BID  . simvastatin  5 mg Oral q1800  . sodium chloride  3 mL Intravenous Q12H   Continuous Infusions: . sodium chloride 1,000 mL (07/22/13 1557)    Principal Problem:    Acute on chronic renal failure Active Problems:   Hypertension   Seizure disorder   CKD (chronic kidney disease) stage 3, GFR 30-59 ml/min   Diabetes   Bilateral lower extremity edema   Anemia    Time spent:    [facility]  [facility] Pager [phone]. If 7PM-7AM, please contact night-coverage at [email or web address], password [facility] 07/24/2013, 5:39 PM  LOS: 2 days

## 2013-07-25 LAB — POTASSIUM: Potassium: 3.3 mEq/L — ABNORMAL LOW (ref 3.5–5.1)

## 2013-07-25 LAB — CBC
HCT: 28.6 % — ABNORMAL LOW (ref 36.0–46.0)
Hemoglobin: 8.8 g/dL — ABNORMAL LOW (ref 12.0–15.0)
MCV: 84.4 fL (ref 78.0–100.0)
RBC: 3.39 MIL/uL — ABNORMAL LOW (ref 3.87–5.11)
RDW: 17 % — ABNORMAL HIGH (ref 11.5–15.5)
WBC: 4.4 10*3/uL (ref 4.0–10.5)

## 2013-07-25 LAB — BASIC METABOLIC PANEL
CO2: 25 mEq/L (ref 19–32)
Chloride: 107 mEq/L (ref 96–112)
Creatinine, Ser: 2.18 mg/dL — ABNORMAL HIGH (ref 0.50–1.10)
GFR calc Af Amer: 25 mL/min — ABNORMAL LOW (ref >90)
Potassium: 2.8 mEq/L — ABNORMAL LOW (ref 3.5–5.1)

## 2013-07-25 LAB — GLUCOSE, CAPILLARY
Glucose-Capillary: 146 mg/dL — ABNORMAL HIGH (ref 70–99)
Glucose-Capillary: 251 mg/dL — ABNORMAL HIGH (ref 70–99)

## 2013-07-25 MED ORDER — POTASSIUM CHLORIDE ER 10 MEQ PO TBCR: 10.0000 meq | EXTENDED_RELEASE_TABLET | Freq: Every day | ORAL | Status: DC

## 2013-07-25 MED ORDER — FUROSEMIDE 20 MG PO TABS: 20.0000 mg | ORAL_TABLET | Freq: Every day | ORAL | Status: DC

## 2013-07-25 MED ORDER — POTASSIUM CHLORIDE CRYS ER 20 MEQ PO TBCR
40.0000 meq | EXTENDED_RELEASE_TABLET | ORAL | Status: AC
  Administered 2013-07-25 (×2): 40 meq via ORAL
  Filled 2013-07-25 (×2): qty 2

## 2013-07-25 NOTE — Progress Notes (Signed)
UR Chart Review Completed  

## 2013-07-25 NOTE — Discharge Summary (Addendum)
Physician Discharge Summary  [name] Tory ZOX:[id number] DOB: [birth year] DOA: 07/22/2013  PCP: Evlyn Courier, MD  Admit date: 07/22/2013 Discharge date: 07/25/2013  Time spent: 35 minutes  Recommendations for Outpatient Follow-up:  1. Follow up with nephrology as previously scheduled 2. Follow up with primary care in 1-2 weeks 3. 2D echocardiogram needs to be done in the outpatient setting. 4. Recommend outpatient cardiology evaluation for systolic dysfunction  Discharge Diagnoses:  Principal Problem:   Acute on chronic renal failure Active Problems:   Hypertension   Seizure disorder   CKD (chronic kidney disease) stage 3, GFR 30-59 ml/min   Diabetes   Bilateral lower extremity edema   Anemia Acute on chronic systolic CHF  Discharge Condition: improved  Diet recommendation: low salt  Filed Weights   07/23/13 0603 07/24/13 0540 07/25/13 0523  Weight: 73.165 kg (161 lb 4.8 oz) 71.169 kg (156 lb 14.4 oz) 69.582 kg (153 lb 6.4 oz)    History of present illness:  [name] is a 72 y.o. female with a past medical history of diabetes, hypertension, stroke and 2012, seizure disorder, chronic kidney disease stage III presents to the emergency room with a chief complaint of lower extremity edema. 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 Daughter reports that this morning when bathing her mother she noticed that her mother's feet were swollen. She states that this was the first time she noticed her mother's feet swelling. In addition she also noticed that her mother's face was "puffy". She also indicates some worsening shortness of breath and cough. She indicates that she went to her primary care provider at this time and she was given some medicine for "a cold". The daughter believes it was an antibiotic in addition she was given sodium bicarbonate.  The patient denies any chest pain palpitations headache dizziness weakness anorexia. The daughter indicates that there has been no change in the patient's appetite or weight. She states the patient has not complained of any pain or discomfort. The daughter does report that the patient was seen by renal specialist and a renal ultrasound was done on August 18. She is unaware of the results and the patient has a followup appointment with Dr. Kristian Covey next week. Lab work in the emergency room significant for chloride of 113, CO2 18, BUN 46, creatinine 2.50, glucose 166 , hemoglobin 8.2, proBNP 27,695. Chest x-ray yields borderline cardiac enlargement and mild vascular congestion. Streaky basilar scarring changes but no infiltrates or effusions. Renal ultrasound done 11 days ago yields no acute findings. Bilateral echogenic kidneys compatible with chronic medical  renal disease. Vital signs significant for a blood pressure of 197/73. Symptoms came on suddenly have persisted characterized as moderate. [facility] are asked to admit   Hospital Course:  This patient was admitted to the hospital with increasing lower extremity edema. She was noted to have worsening renal function and metabolic acidosis on admission. Patient has known chronic kidney disease the previous creatinine was noted to be a 1.2. On her admission, creatinine was noted to be 2.5. Patient was started on IV diuresis and has had good urine output. Her edema has resolved she does not have any shortness of breath. Currently she does appear euvolemic. Her creatinine has plateaued at 2.1. This may be her new baseline. She has followup with nephrology later this week and has been advised to keep this appointment. The  patient has known systolic dysfunction with an ejection fraction of 40-45%. She did not have any chest pain her cardiac enzymes returned to be negative. Echo will need to be repeated as an outpatient. She has chronic anemia was felt to  be secondary to chronic kidney disease. This has been stable during hospitalization. Her metabolic acidosis has resolved. She was also found be hypokalemic which was felt to be due to Lasix and has been replaced.  Procedures:  none  Consultations:  none  Discharge Exam: Filed Vitals:   07/25/13 1005  BP: 157/66  Pulse: 87  Temp:   Resp:     General: NAD Cardiovascular: S1, s2 RRR Respiratory: CTA B  Discharge Instructions     Medication List    STOP taking these medications       aspirin 325 MG tablet     sodium bicarbonate 650 MG tablet      TAKE these medications       dipyridamole-aspirin 200-25 MG per 12 hr capsule  Commonly known as:  AGGRENOX  Take 1 capsule by mouth 2 (two) times daily.     donepezil 5 MG tablet  Commonly known as:  ARICEPT  Take 5 mg by mouth daily.     folic acid 1 MG tablet  Commonly known as:  FOLVITE  Take 1 mg by mouth daily.     furosemide 20 MG tablet  Commonly known as:  LASIX  Take 1 tablet (20 mg total) by mouth daily.     glyBURIDE 2.5 MG tablet  Commonly known as:  DIABETA  Take 2.5 mg by mouth daily with breakfast.     hydrALAZINE 50 MG tablet  Commonly known as:  APRESOLINE  Take 1 tablet (50 mg total) by mouth every 8 (eight) hours.     metoprolol succinate 100 MG 24 hr tablet  Commonly known as:  TOPROL-XL  Take 50 mg by mouth daily.     potassium chloride 10 MEQ tablet  Commonly known as:  K-DUR  Take 1 tablet (10 mEq total) by mouth daily.     pravastatin 20 MG tablet  Commonly known as:  PRAVACHOL  Take 20 mg by mouth daily.     Vitamin D (Ergocalciferol) 50000 UNITS Caps capsule  Commonly known as:  DRISDOL  Take 50,000 Units by mouth every 7 (seven) days. Takes on Tuesday       No Known Allergies    The results of significant diagnostics from this hospitalization (including imaging, microbiology, ancillary and laboratory) are listed below for reference.    Significant Diagnostic  Studies: Dg Chest 2 View  07/22/2013   *RADIOLOGY REPORT*  Clinical Data: Hypertension.  Ex-smoker.  CHEST - 2 VIEW  Comparison: 09/03/2012.  Findings: The heart is borderline enlarged but stable.  The mediastinal and hilar contours are unchanged.  Mild tortuosity of the thoracic aorta.  Stable surgical changes from bypass surgery. The lungs are clear.  Mild central vascular congestion and streaky basilar scarring changes but no edema or effusions.  The bony thorax is intact.  IMPRESSION: Borderline cardiac enlargement and mild vascular congestion. Streaky basilar scarring changes but no infiltrates or effusions.   Original Report Authenticated By: Rudie Meyer, M.D.   US Renal  07/11/2013   *RADIOLOGY REPORT*  Clinical Data:  Renal insufficiency  RENAL/URINARY TRACT ULTRASOUND COMPLETE  Comparison:  None.  Findings:  Right Kidney:  Measures 10.2 cm.  There is diffusely increased cortical echogenicity. No evidence of mass or hydronephrosis.  Left Kidney:  Measures 10.8 cm.  There is diffusely increased cortical echogenicity. No evidence of mass or hydronephrosis.  Bladder:  Appears normal for degree of bladder distention.  IMPRESSION:  1.  No acute findings. 2.  Bilateral echogenic kidneys compatible with chronic medical renal disease.   Original Report Authenticated By: Signa Kell, M.D.    Microbiology: No results found for this or any previous visit (from the past 240 hour(s)).   Labs: Basic Metabolic Panel:  Recent Labs Lab 07/22/13 1420 07/23/13 0628 07/24/13 0632 07/25/13 0458  NA 143 143 137 142  K 3.6 3.1* 3.8 2.8*  CL 113* 112 106 107  CO2 18* 19 17* 25  GLUCOSE 166* 133* 157* 156*  BUN 46* 43* 39* 40*  CREATININE 2.50* 2.39* 2.19* 2.18*  CALCIUM 8.2* 8.3* 8.5 8.4   Liver Function Tests: No results found for this basename: AST, ALT, ALKPHOS, BILITOT, PROT, ALBUMIN,  in the last 168 hours No results found for this basename: LIPASE, AMYLASE,  in the last 168 hours No results  found for this basename: AMMONIA,  in the last 168 hours CBC:  Recent Labs Lab 07/22/13 1420 07/23/13 0628 07/25/13 0458  WBC 4.9 4.1 4.4  NEUTROABS 3.6  --   --   HGB 8.2* 8.3* 8.8*  HCT 26.3* 26.4* 28.6*  MCV 85.7 85.2 84.4  PLT 232 229 309   Cardiac Enzymes:  Recent Labs Lab 07/22/13 1420  TROPONINI <0.30   BNP: BNP (last 3 results)  Recent Labs  07/22/13 1420  PROBNP 27695.0*   CBG:  Recent Labs Lab 07/24/13 1121 07/24/13 1625 07/24/13 2012 07/25/13 0800 07/25/13 1136  GLUCAP 237* 177* 181* 146* 251*       Signed:  Kazandra Forstrom  [facility] 07/25/2013, 12:42 PM

## 2013-07-25 NOTE — Progress Notes (Signed)
Patient being discharged home with family. Family verbalizes understanding of care. IV cath removed and intact. No pain/swelling at site. Prescriptions and paper given to niece(KIA) that picked her up.

## 2013-07-25 NOTE — Care Management Note (Signed)
    Page 1 of 1   07/25/2013     3:21:01 PM   CARE MANAGEMENT NOTE 07/25/2013  Patient:  AQUA, DENSLOW   Account Number:  [id number]  Date Initiated:  07/25/2013  Documentation initiated by:  Anibal Henderson  Subjective/Objective Assessment:   Admitted from home with daughter, with ARF, on lasix BID. Pt's daughter assists as needed with bath , etc  in the home and patient  will return home at D/C.     Action/Plan:   Will follow for needs at D/C   Anticipated DC Date:  07/25/2013   Anticipated DC Plan:  HOME/SELF CARE      DC Planning Services  CM consult      Choice offered to / List presented to:             Status of service:  Completed, signed off Medicare Important Message given?   (If response is "NO", the following Medicare IM given date fields will be blank) Date Medicare IM given:   Date Additional Medicare IM given:    Discharge Disposition:    Per UR Regulation:  Reviewed for med. necessity/level of care/duration of stay  If discussed at Long Length of Stay Meetings, dates discussed:    Comments:  07/25/13 1515 Anibal Henderson RN/CM

## 2013-08-25 ENCOUNTER — Encounter (HOSPITAL_COMMUNITY)
Admission: RE | Admit: 2013-08-25 | Discharge: 2013-08-25 | Disposition: A | Discharge status: Home / Self Care | Payer: Medicare Other | Source: Ambulatory Visit | Attending: Nephrology | Admitting: Nephrology

## 2013-08-25 DIAGNOSIS — D509 Iron deficiency anemia, unspecified: Secondary | ICD-10-CM | POA: Insufficient documentation

## 2013-08-25 MED ORDER — SODIUM CHLORIDE 0.9 % IV SOLN
INTRAVENOUS | Status: DC
  Administered 2013-08-25: 11:00:00 via INTRAVENOUS

## 2013-08-25 MED ORDER — SODIUM CHLORIDE 0.9 % IJ SOLN
10.0000 mL | Freq: Once | INTRAMUSCULAR | Status: AC
  Administered 2013-08-25: 10 mL via INTRAVENOUS

## 2013-08-25 MED ORDER — SODIUM CHLORIDE 0.9 % IV SOLN
INTRAVENOUS | Status: AC
  Filled 2013-08-25: qty 50

## 2013-08-25 MED ORDER — FERUMOXYTOL INJECTION 510 MG/17 ML
510.0000 mg | Freq: Once | INTRAVENOUS | Status: AC
  Administered 2013-08-25: 510 mg via INTRAVENOUS
  Filled 2013-08-25: qty 17

## 2013-09-01 ENCOUNTER — Encounter (HOSPITAL_COMMUNITY)
Admission: RE | Admit: 2013-09-01 | Discharge: 2013-09-01 | Disposition: A | Discharge status: Home / Self Care | Payer: Medicare Other | Source: Ambulatory Visit | Attending: Nephrology | Admitting: Nephrology

## 2013-09-01 MED ORDER — SODIUM CHLORIDE 0.9 % IV SOLN
INTRAVENOUS | Status: AC
  Filled 2013-09-01: qty 50

## 2013-09-01 MED ORDER — FERUMOXYTOL INJECTION 510 MG/17 ML
510.0000 mg | Freq: Once | INTRAVENOUS | Status: AC
  Administered 2013-09-01: 510 mg via INTRAVENOUS
  Filled 2013-09-01: qty 17

## 2013-09-01 MED ORDER — SODIUM CHLORIDE 0.9 % IV SOLN
Freq: Once | INTRAVENOUS | Status: AC
  Administered 2013-09-01: 11:00:00 via INTRAVENOUS

## 2014-01-16 ENCOUNTER — Encounter (HOSPITAL_COMMUNITY): Payer: Self-pay | Admitting: Emergency Medicine

## 2014-01-16 ENCOUNTER — Inpatient Hospital Stay (HOSPITAL_COMMUNITY)
Admission: EM | Admit: 2014-01-16 | Discharge: 2014-01-19 | DRG: 377 | Disposition: A | Discharge status: Home Health | Payer: Medicare Other | Attending: Internal Medicine | Admitting: Internal Medicine

## 2014-01-16 ENCOUNTER — Emergency Department (HOSPITAL_COMMUNITY): Payer: Medicare Other

## 2014-01-16 DIAGNOSIS — E782 Mixed hyperlipidemia: Secondary | ICD-10-CM | POA: Diagnosis present

## 2014-01-16 DIAGNOSIS — D509 Iron deficiency anemia, unspecified: Secondary | ICD-10-CM | POA: Diagnosis present

## 2014-01-16 DIAGNOSIS — Z79899 Other long term (current) drug therapy: Secondary | ICD-10-CM

## 2014-01-16 DIAGNOSIS — D62 Acute posthemorrhagic anemia: Secondary | ICD-10-CM | POA: Diagnosis present

## 2014-01-16 DIAGNOSIS — I5022 Chronic systolic (congestive) heart failure: Secondary | ICD-10-CM | POA: Diagnosis present

## 2014-01-16 DIAGNOSIS — I2510 Atherosclerotic heart disease of native coronary artery without angina pectoris: Secondary | ICD-10-CM | POA: Diagnosis present

## 2014-01-16 DIAGNOSIS — E872 Acidosis, unspecified: Secondary | ICD-10-CM | POA: Diagnosis present

## 2014-01-16 DIAGNOSIS — Z87891 Personal history of nicotine dependence: Secondary | ICD-10-CM | POA: Diagnosis not present

## 2014-01-16 DIAGNOSIS — N189 Chronic kidney disease, unspecified: Secondary | ICD-10-CM

## 2014-01-16 DIAGNOSIS — R531 Weakness: Secondary | ICD-10-CM | POA: Diagnosis present

## 2014-01-16 DIAGNOSIS — K449 Diaphragmatic hernia without obstruction or gangrene: Secondary | ICD-10-CM | POA: Diagnosis present

## 2014-01-16 DIAGNOSIS — Z833 Family history of diabetes mellitus: Secondary | ICD-10-CM | POA: Diagnosis not present

## 2014-01-16 DIAGNOSIS — E1129 Type 2 diabetes mellitus with other diabetic kidney complication: Secondary | ICD-10-CM | POA: Diagnosis present

## 2014-01-16 DIAGNOSIS — R4182 Altered mental status, unspecified: Secondary | ICD-10-CM | POA: Diagnosis present

## 2014-01-16 DIAGNOSIS — N058 Unspecified nephritic syndrome with other morphologic changes: Secondary | ICD-10-CM | POA: Diagnosis present

## 2014-01-16 DIAGNOSIS — I509 Heart failure, unspecified: Secondary | ICD-10-CM | POA: Diagnosis present

## 2014-01-16 DIAGNOSIS — I10 Essential (primary) hypertension: Secondary | ICD-10-CM

## 2014-01-16 DIAGNOSIS — N289 Disorder of kidney and ureter, unspecified: Secondary | ICD-10-CM

## 2014-01-16 DIAGNOSIS — I252 Old myocardial infarction: Secondary | ICD-10-CM | POA: Diagnosis not present

## 2014-01-16 DIAGNOSIS — I129 Hypertensive chronic kidney disease with stage 1 through stage 4 chronic kidney disease, or unspecified chronic kidney disease: Secondary | ICD-10-CM | POA: Diagnosis present

## 2014-01-16 DIAGNOSIS — D649 Anemia, unspecified: Secondary | ICD-10-CM

## 2014-01-16 DIAGNOSIS — E119 Type 2 diabetes mellitus without complications: Secondary | ICD-10-CM | POA: Diagnosis present

## 2014-01-16 DIAGNOSIS — G40909 Epilepsy, unspecified, not intractable, without status epilepticus: Secondary | ICD-10-CM | POA: Diagnosis present

## 2014-01-16 DIAGNOSIS — R5383 Other fatigue: Secondary | ICD-10-CM

## 2014-01-16 DIAGNOSIS — I214 Non-ST elevation (NSTEMI) myocardial infarction: Secondary | ICD-10-CM | POA: Diagnosis present

## 2014-01-16 DIAGNOSIS — I2589 Other forms of chronic ischemic heart disease: Secondary | ICD-10-CM | POA: Diagnosis present

## 2014-01-16 DIAGNOSIS — E860 Dehydration: Secondary | ICD-10-CM | POA: Diagnosis present

## 2014-01-16 DIAGNOSIS — Z7982 Long term (current) use of aspirin: Secondary | ICD-10-CM

## 2014-01-16 DIAGNOSIS — Z8673 Personal history of transient ischemic attack (TIA), and cerebral infarction without residual deficits: Secondary | ICD-10-CM | POA: Diagnosis not present

## 2014-01-16 DIAGNOSIS — I6350 Cerebral infarction due to unspecified occlusion or stenosis of unspecified cerebral artery: Secondary | ICD-10-CM

## 2014-01-16 DIAGNOSIS — I259 Chronic ischemic heart disease, unspecified: Secondary | ICD-10-CM

## 2014-01-16 DIAGNOSIS — K922 Gastrointestinal hemorrhage, unspecified: Secondary | ICD-10-CM | POA: Diagnosis present

## 2014-01-16 DIAGNOSIS — I639 Cerebral infarction, unspecified: Secondary | ICD-10-CM | POA: Diagnosis present

## 2014-01-16 DIAGNOSIS — N183 Chronic kidney disease, stage 3 unspecified: Secondary | ICD-10-CM

## 2014-01-16 DIAGNOSIS — I340 Nonrheumatic mitral (valve) insufficiency: Secondary | ICD-10-CM

## 2014-01-16 DIAGNOSIS — N184 Chronic kidney disease, stage 4 (severe): Secondary | ICD-10-CM | POA: Diagnosis present

## 2014-01-16 DIAGNOSIS — N179 Acute kidney failure, unspecified: Secondary | ICD-10-CM

## 2014-01-16 DIAGNOSIS — F0390 Unspecified dementia without behavioral disturbance: Secondary | ICD-10-CM | POA: Diagnosis present

## 2014-01-16 DIAGNOSIS — Z951 Presence of aortocoronary bypass graft: Secondary | ICD-10-CM

## 2014-01-16 DIAGNOSIS — Z823 Family history of stroke: Secondary | ICD-10-CM | POA: Diagnosis not present

## 2014-01-16 DIAGNOSIS — R5381 Other malaise: Secondary | ICD-10-CM

## 2014-01-16 DIAGNOSIS — I25810 Atherosclerosis of coronary artery bypass graft(s) without angina pectoris: Secondary | ICD-10-CM

## 2014-01-16 HISTORY — DX: Ischemic cardiomyopathy: I25.5

## 2014-01-16 HISTORY — DX: Personal history of other diseases of the digestive system: Z87.19

## 2014-01-16 HISTORY — DX: Unspecified dementia, unspecified severity, without behavioral disturbance, psychotic disturbance, mood disturbance, and anxiety: F03.90

## 2014-01-16 HISTORY — DX: Mixed hyperlipidemia: E78.2

## 2014-01-16 HISTORY — DX: Atherosclerotic heart disease of native coronary artery without angina pectoris: I25.10

## 2014-01-16 HISTORY — DX: Essential (primary) hypertension: I10

## 2014-01-16 HISTORY — DX: Personal history of transient ischemic attack (TIA), and cerebral infarction without residual deficits: Z86.73

## 2014-01-16 HISTORY — DX: Chronic kidney disease, stage 4 (severe): N18.4

## 2014-01-16 HISTORY — DX: Type 2 diabetes mellitus without complications: E11.9

## 2014-01-16 LAB — LACTIC ACID, PLASMA: Lactic Acid, Venous: 2.7 mmol/L — ABNORMAL HIGH (ref 0.5–2.2)

## 2014-01-16 LAB — URINALYSIS, ROUTINE W REFLEX MICROSCOPIC
BILIRUBIN URINE: NEGATIVE
Glucose, UA: NEGATIVE mg/dL
Hgb urine dipstick: NEGATIVE
Ketones, ur: NEGATIVE mg/dL
NITRITE: NEGATIVE
PH: 5.5 (ref 5.0–8.0)
Protein, ur: 30 mg/dL — AB
Specific Gravity, Urine: 1.02 (ref 1.005–1.030)
Urobilinogen, UA: 0.2 mg/dL (ref 0.0–1.0)

## 2014-01-16 LAB — CBC WITH DIFFERENTIAL/PLATELET
Basophils Absolute: 0 10*3/uL (ref 0.0–0.1)
Basophils Relative: 0 % (ref 0–1)
EOS PCT: 0 % (ref 0–5)
Eosinophils Absolute: 0 10*3/uL (ref 0.0–0.7)
HCT: 15.9 % — ABNORMAL LOW (ref 36.0–46.0)
Hemoglobin: 4.8 g/dL — CL (ref 12.0–15.0)
LYMPHS PCT: 11 % — AB (ref 12–46)
Lymphs Abs: 0.7 10*3/uL (ref 0.7–4.0)
MCH: 27.4 pg (ref 26.0–34.0)
MCHC: 30.2 g/dL (ref 30.0–36.0)
MCV: 90.9 fL (ref 78.0–100.0)
MONO ABS: 0.6 10*3/uL (ref 0.1–1.0)
MONOS PCT: 9 % (ref 3–12)
NEUTROS PCT: 80 % — AB (ref 43–77)
Neutro Abs: 5.4 10*3/uL (ref 1.7–7.7)
PLATELETS: 334 10*3/uL (ref 150–400)
RBC: 1.75 MIL/uL — AB (ref 3.87–5.11)
RDW: 18.5 % — ABNORMAL HIGH (ref 11.5–15.5)
WBC: 6.7 10*3/uL (ref 4.0–10.5)

## 2014-01-16 LAB — COMPREHENSIVE METABOLIC PANEL
ALBUMIN: 3.3 g/dL — AB (ref 3.5–5.2)
ALK PHOS: 41 U/L (ref 39–117)
ALT: 8 U/L (ref 0–35)
AST: 16 U/L (ref 0–37)
BUN: 64 mg/dL — AB (ref 6–23)
CO2: 13 mEq/L — ABNORMAL LOW (ref 19–32)
Calcium: 8.7 mg/dL (ref 8.4–10.5)
Chloride: 111 mEq/L (ref 96–112)
Creatinine, Ser: 3.02 mg/dL — ABNORMAL HIGH (ref 0.50–1.10)
GFR calc Af Amer: 17 mL/min — ABNORMAL LOW (ref >90)
GFR calc non Af Amer: 14 mL/min — ABNORMAL LOW (ref >90)
Glucose, Bld: 135 mg/dL — ABNORMAL HIGH (ref 70–99)
POTASSIUM: 4.6 meq/L (ref 3.7–5.3)
SODIUM: 141 meq/L (ref 137–147)
TOTAL PROTEIN: 8.7 g/dL — AB (ref 6.0–8.3)
Total Bilirubin: 0.2 mg/dL — ABNORMAL LOW (ref 0.3–1.2)

## 2014-01-16 LAB — TROPONIN I: TROPONIN I: 0.42 ng/mL — AB (ref <0.30)

## 2014-01-16 LAB — URINE MICROSCOPIC-ADD ON

## 2014-01-16 LAB — RETICULOCYTES
RBC.: 1.74 MIL/uL — ABNORMAL LOW (ref 3.87–5.11)
RETIC CT PCT: 6.3 % — AB (ref 0.4–3.1)
Retic Count, Absolute: 109.6 10*3/uL (ref 19.0–186.0)

## 2014-01-16 LAB — MAGNESIUM: MAGNESIUM: 2.5 mg/dL (ref 1.5–2.5)

## 2014-01-16 LAB — PREPARE RBC (CROSSMATCH)

## 2014-01-16 LAB — PROTIME-INR
INR: 1.22 (ref 0.00–1.49)
Prothrombin Time: 15.1 seconds (ref 11.6–15.2)

## 2014-01-16 LAB — APTT: APTT: 28 s (ref 24–37)

## 2014-01-16 LAB — ABO/RH: ABO/RH(D): A POS

## 2014-01-16 LAB — OCCULT BLOOD X 1 CARD TO LAB, STOOL: Fecal Occult Bld: NEGATIVE

## 2014-01-16 MED ORDER — SODIUM CHLORIDE 0.9 % IV SOLN
250.0000 mL | INTRAVENOUS | Status: DC | PRN
  Administered 2014-01-16: 250 mL via INTRAVENOUS

## 2014-01-16 MED ORDER — FUROSEMIDE 20 MG PO TABS: 20.0000 mg | ORAL_TABLET | Freq: Every day | ORAL | Status: DC

## 2014-01-16 MED ORDER — LEVETIRACETAM 100 MG/ML PO SOLN
500.0000 mg | Freq: Two times a day (BID) | ORAL | Status: DC
  Administered 2014-01-16 – 2014-01-19 (×6): 500 mg via ORAL
  Filled 2014-01-16 (×8): qty 5

## 2014-01-16 MED ORDER — SODIUM CHLORIDE 0.9 % IJ SOLN
3.0000 mL | Freq: Two times a day (BID) | INTRAMUSCULAR | Status: DC
  Administered 2014-01-16: 3 mL via INTRAVENOUS

## 2014-01-16 MED ORDER — SODIUM CHLORIDE 0.9 % IJ SOLN: 3.0000 mL | INTRAMUSCULAR | Status: DC | PRN

## 2014-01-16 MED ORDER — METOPROLOL SUCCINATE ER 50 MG PO TB24
50.0000 mg | ORAL_TABLET | Freq: Every day | ORAL | Status: DC
  Administered 2014-01-17 – 2014-01-19 (×3): 50 mg via ORAL
  Filled 2014-01-16 (×3): qty 1

## 2014-01-16 MED ORDER — SODIUM CHLORIDE 0.9 % IV SOLN
INTRAVENOUS | Status: DC
  Administered 2014-01-16: 17:00:00 via INTRAVENOUS

## 2014-01-16 MED ORDER — HYDRALAZINE HCL 25 MG PO TABS
50.0000 mg | ORAL_TABLET | Freq: Three times a day (TID) | ORAL | Status: DC
  Administered 2014-01-16 – 2014-01-19 (×9): 50 mg via ORAL
  Filled 2014-01-16 (×9): qty 2

## 2014-01-16 MED ORDER — DONEPEZIL HCL 5 MG PO TABS
5.0000 mg | ORAL_TABLET | Freq: Every day | ORAL | Status: DC
  Administered 2014-01-17 – 2014-01-19 (×3): 5 mg via ORAL
  Filled 2014-01-16 (×2): qty 1

## 2014-01-16 MED ORDER — LEVETIRACETAM 500 MG/5ML IV SOLN
INTRAVENOUS | Status: AC
  Filled 2014-01-16: qty 5

## 2014-01-16 NOTE — Progress Notes (Signed)
Received patient from ED, 1st unit of blood running at 125cc/hr.  Patient remains alert and oriented and cooperative.  Vitals 118/105, HR 107, Oxygen 100% RR 23, T-97.9. Monitoring closely

## 2014-01-16 NOTE — ED Notes (Signed)
CT scan clear of acute finding.

## 2014-01-16 NOTE — ED Notes (Signed)
CRITICAL VALUE ALERT  Critical value received:  Hemoglobin 4.8  Date of notification:  01/16/2014  Time of notification:  1725  Critical value read back:yes  Nurse who received alert:  Fredna Dow  MD notified (1st page): Rolland Porter  Time of first page:  1726  MD notified (2nd page):  Time of second page:  Responding MD:  Rolland Porter  Time MD responded:  1726

## 2014-01-16 NOTE — ED Provider Notes (Signed)
CSN: [id number]     Arrival date & time 01/16/14  1520 History  This chart was scribed for Janice Norrie, MD by Era Bumpers, ED scribe. This patient was seen in room APA05/AP[facility]05 and the patient's care was started at 3:51 PM .    Chief Complaint  Patient presents with  . Altered Mental Status   Level 5 caveat for dementia   The history is provided by the patient and a relative. No language interpreter was used.   HPI Comments: [name] is a 73 y.o. female with a h/o CVA and CABG who presents to the Emergency Department with her daughter complaining of AMS. Pt was brought in by her daughter who noticed yesterday pt was walking slower and that her eyes "looked different," she didn't have much energy. Daughter states she has a decreased appetite. Daughter also reports she walks with walker since her stoke left her weaker on her left side, has a h/o CVA and seizures as well as a CABG. Pt denies HA, chest pain, SOB, and sweating. Pt does not smoke or drink alcohol.   PCP Dr French Ana  Past Medical History  Diagnosis Date  . Diabetes mellitus   . Hypertension   . Stroke   . MI, old   . Renal disorder   . Seizure disorder    Past Surgical History  Procedure Laterality Date  . Coronary artery bypass graft    . Tee without cardioversion  01/28/2012    Procedure: TRANSESOPHAGEAL ECHOCARDIOGRAM (TEE);  Surgeon: Lelon Perla, MD;  Location: [facility] ENDOSCOPY;  Service: Cardiovascular;  Laterality: [name];   Family History  Problem Relation Age of Onset  . Stroke Father   . Diabetes type II Mother    History  Substance Use Topics  . Smoking status: Former Smoker -- 1.00 packs/day for 50 years    Types: Cigarettes  . Smokeless tobacco: Not on file  . Alcohol Use: No   Pt lives with her daughter Pt uses a walker   OB History   Grav Para Term Preterm Abortions TAB SAB Ect Mult Living                 Review of Systems  Constitutional: Negative for fever and chills.  Respiratory:  Negative for cough and shortness of breath.   Cardiovascular: Negative for chest pain.  Gastrointestinal: Negative for abdominal pain.  Musculoskeletal: Negative for back pain.      Allergies  Review of patient's allergies indicates no known allergies.  Home Medications   Current Outpatient Rx  Name  Route  Sig  Dispense  Refill  . dipyridamole-aspirin (AGGRENOX) 25-200 MG per 12 hr capsule   Oral   Take 1 capsule by mouth 2 (two) times daily.         Marland Kitchen donepezil (ARICEPT) 5 MG tablet   Oral   Take 5 mg by mouth daily.         . folic acid (FOLVITE) 1 MG tablet   Oral   Take 1 mg by mouth daily.         . furosemide (LASIX) 20 MG tablet   Oral   Take 1 tablet (20 mg total) by mouth daily.   30 tablet   1   . glyBURIDE (DIABETA) 2.5 MG tablet   Oral   Take 2.5 mg by mouth daily with breakfast.         . hydrALAZINE (APRESOLINE) 50 MG tablet   Oral  Take 1 tablet (50 mg total) by mouth every 8 (eight) hours.   90 tablet   0   . metoprolol (TOPROL-XL) 100 MG 24 hr tablet   Oral   Take 50 mg by mouth daily.          . potassium chloride (K-DUR) 10 MEQ tablet   Oral   Take 1 tablet (10 mEq total) by mouth daily.   30 tablet   1   . pravastatin (PRAVACHOL) 20 MG tablet   Oral   Take 20 mg by mouth daily.           . Vitamin D, Ergocalciferol, (DRISDOL) 50000 UNITS CAPS   Oral   Take 50,000 Units by mouth every 7 (seven) days. Takes on Tuesday          Triage Vitals: BP 137/53  Pulse 109  Temp(Src) 97.5 F (36.4 C) (Oral)  Resp 18  Ht 5\' 6"  (1.676 m)  Wt 140 lb (63.504 kg)  BMI 22.61 kg/m2  SpO2 97%  Vital signs normal except tachycardia   Physical Exam  Nursing note and vitals reviewed. Constitutional: She appears well-developed and well-nourished.  Non-toxic appearance. She does not appear ill. No distress.  HENT:  Head: Normocephalic and atraumatic.  Right Ear: External ear normal.  Left Ear: External ear normal.  Nose: Nose  normal. No mucosal edema or rhinorrhea.  Mouth/Throat: Oropharynx is clear and moist and mucous membranes are normal. No dental abscesses or uvula swelling.  Eyes: Conjunctivae and EOM are normal. Pupils are equal, round, and reactive to light.  Neck: Normal range of motion and full passive range of motion without pain. Neck supple.  Cardiovascular: Normal rate, regular rhythm and normal heart sounds.  Exam reveals no gallop and no friction rub.   No murmur heard. Pulmonary/Chest: Effort normal and breath sounds normal. No respiratory distress. She has no wheezes. She has no rhonchi. She has no rales. She exhibits no tenderness and no crepitus.  Abdominal: Soft. Normal appearance and bowel sounds are normal. She exhibits no distension. There is no tenderness. There is no rebound and no guarding.  Musculoskeletal: Normal range of motion. She exhibits no edema and no tenderness.  Moves all extremities well.   Neurological: She is alert. She has normal strength. No cranial nerve deficit.  No pronator drift Grips are equal bilaterally Heel to shin unable to execute, she has trouble following directions   Skin: Skin is warm, dry and intact. No rash noted. No erythema. No pallor.  Psychiatric: She has a normal mood and affect. Her speech is normal and behavior is normal. Her mood appears not anxious.    ED Course  Procedures (including critical care time) Medications  levETIRAcetam (KEPPRA) 100 MG/ML solution 500 mg (500 mg Oral Given 01/16/14 2238)  furosemide (LASIX) tablet 20 mg (not administered)  donepezil (ARICEPT) tablet 5 mg (not administered)  hydrALAZINE (APRESOLINE) tablet 50 mg (50 mg Oral Given 01/16/14 2246)  metoprolol succinate (TOPROL-XL) 24 hr tablet 50 mg (not administered)  sodium chloride 0.9 % injection 3 mL (3 mLs Intravenous Given 01/16/14 2246)  sodium chloride 0.9 % injection 3 mL (0 mLs Intravenous Duplicate 9/37/16 9678)  0.9 %  sodium chloride infusion (250 mLs  Intravenous Rate/Dose Verify 01/16/14 2300)     DIAGNOSTIC STUDIES: Oxygen Saturation is 97% on room air, normal by my interpretation.    COORDINATION OF CARE: At 3:56 PM Discussed treatment plan with patient which includes CAT scan. Patient agrees.  1745 patient prepared for blood transfusion for new acute worsening of her chronic anemia. Patient denies chest pain or shortness of breath however she does have EKG findings suggestive of ischemia with mildly positive troponin. This is most likely from her anemia. Patient has had a history of coronary artery disease with one-vessel bypass surgery in the past. Patient was typed and crossed for 2 units initially. Lactic acid level was done after reviewing her metabolic acidosis on her Cmet. When daughter was told about her anemia she stated the patient did have dark black stools yesterday.  19:00 Dr Shanon Brow, admit to step down team 1  Labs Review Results for orders placed during the hospital encounter of 01/16/14  CBC WITH DIFFERENTIAL      Result Value Ref Range   WBC 6.7  4.0 - 10.5 K/uL   RBC 1.75 (*) 3.87 - 5.11 MIL/uL   Hemoglobin 4.8 (*) 12.0 - 15.0 g/dL   HCT 15.9 (*) 36.0 - 46.0 %   MCV 90.9  78.0 - 100.0 fL   MCH 27.4  26.0 - 34.0 pg   MCHC 30.2  30.0 - 36.0 g/dL   RDW 18.5 (*) 11.5 - 15.5 %   Platelets 334  150 - 400 K/uL   Neutrophils Relative % 80 (*) 43 - 77 %   Lymphocytes Relative 11 (*) 12 - 46 %   Monocytes Relative 9  3 - 12 %   Eosinophils Relative 0  0 - 5 %   Basophils Relative 0  0 - 1 %   Neutro Abs 5.4  1.7 - 7.7 K/uL   Lymphs Abs 0.7  0.7 - 4.0 K/uL   Monocytes Absolute 0.6  0.1 - 1.0 K/uL   Eosinophils Absolute 0.0  0.0 - 0.7 K/uL   Basophils Absolute 0.0  0.0 - 0.1 K/uL   RBC Morphology POLYCHROMASIA PRESENT     WBC Morphology TOXIC GRANULATION     Smear Review LARGE PLATELETS PRESENT    COMPREHENSIVE METABOLIC PANEL      Result Value Ref Range   Sodium 141  137 - 147 mEq/L   Potassium 4.6  3.7 - 5.3  mEq/L   Chloride 111  96 - 112 mEq/L   CO2 13 (*) 19 - 32 mEq/L   Glucose, Bld 135 (*) 70 - 99 mg/dL   BUN 64 (*) 6 - 23 mg/dL   Creatinine, Ser 3.02 (*) 0.50 - 1.10 mg/dL   Calcium 8.7  8.4 - 10.5 mg/dL   Total Protein 8.7 (*) 6.0 - 8.3 g/dL   Albumin 3.3 (*) 3.5 - 5.2 g/dL   AST 16  0 - 37 U/L   ALT 8  0 - 35 U/L   Alkaline Phosphatase 41  39 - 117 U/L   Total Bilirubin 0.2 (*) 0.3 - 1.2 mg/dL   GFR calc non Af Amer 14 (*) >90 mL/min   GFR calc Af Amer 17 (*) >90 mL/min  APTT      Result Value Ref Range   aPTT 28  24 - 37 seconds  PROTIME-INR      Result Value Ref Range   Prothrombin Time 15.1  11.6 - 15.2 seconds   INR 1.22  0.00 - 1.49  TROPONIN I      Result Value Ref Range   Troponin I 0.42 (*) <0.30 ng/mL  MAGNESIUM      Result Value Ref Range   Magnesium 2.5  1.5 - 2.5 mg/dL  URINALYSIS, ROUTINE W REFLEX  MICROSCOPIC      Result Value Ref Range   Color, Urine YELLOW  YELLOW   APPearance CLEAR  CLEAR   Specific Gravity, Urine 1.020  1.005 - 1.030   pH 5.5  5.0 - 8.0   Glucose, UA NEGATIVE  NEGATIVE mg/dL   Hgb urine dipstick NEGATIVE  NEGATIVE   Bilirubin Urine NEGATIVE  NEGATIVE   Ketones, ur NEGATIVE  NEGATIVE mg/dL   Protein, ur 30 (*) NEGATIVE mg/dL   Urobilinogen, UA 0.2  0.0 - 1.0 mg/dL   Nitrite NEGATIVE  NEGATIVE   Leukocytes, UA TRACE (*) NEGATIVE  OCCULT BLOOD X 1 CARD TO LAB, STOOL      Result Value Ref Range   Fecal Occult Bld NEGATIVE  NEGATIVE  URINE MICROSCOPIC-ADD ON      Result Value Ref Range   Squamous Epithelial / LPF MANY (*) RARE   WBC, UA 7-10  <3 WBC/hpf   Bacteria, UA FEW (*) RARE  LACTIC ACID, PLASMA      Result Value Ref Range   Lactic Acid, Venous 2.7 (*) 0.5 - 2.2 mmol/L  RETICULOCYTES      Result Value Ref Range   Retic Ct Pct 6.3 (*) 0.4 - 3.1 %   RBC. 1.74 (*) 3.87 - 5.11 MIL/uL   Retic Count, Manual 109.6  19.0 - 186.0 K/uL  TYPE AND SCREEN      Result Value Ref Range   ABO/RH(D) A POS     Antibody Screen NEG      Sample Expiration 01/19/2014     Unit Number [id number]     Blood Component Type RBC, LR IRR     Unit division 00     Status of Unit ISSUED     Transfusion Status OK TO TRANSFUSE     Crossmatch Result Compatible     Unit Number [id number]     Blood Component Type RBC, LR IRR     Unit division 00     Status of Unit ALLOCATED     Transfusion Status OK TO TRANSFUSE     Crossmatch Result Compatible    PREPARE RBC (CROSSMATCH)      Result Value Ref Range   Order Confirmation ORDER PROCESSED BY BLOOD BANK    ABO/RH      Result Value Ref Range   ABO/RH(D) A POS     Laboratory interpretation all normal except acute worsening of her chronic renal insufficiency, contaminated urine sample, metabolic acidosis with anion gap of 17, marked worsening of her anemia which is usually 8-10 hemoglobin, mildly elevated lactic acid    Imaging Review Ct Head Wo Contrast  01/16/2014   CLINICAL DATA:  Altered mental status.  EXAM: CT HEAD WITHOUT CONTRAST  TECHNIQUE: Contiguous axial images were obtained from the base of the skull through the vertex without intravenous contrast.  COMPARISON:  Head CT scan 09/03/2012.  FINDINGS: The brain is atrophic with chronic microvascular ischemic change. 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 Remote basal ganglia lacunar infarctions are noted. No evidence of acute abnormality including infarct, hemorrhage, mass lesion, mass effect, midline shift or abnormal extra-axial fluid collection. No hydrocephalus or pneumocephalus. The calvarium is intact.  IMPRESSION: No acute finding.  Stable compared to prior exam.   Electronically Signed   By: Inge Rise M.D.   On: 01/16/2014 16:26    EKG Interpretation    Date/Time:  Monday January 16 2014 15:39:21 EST Ventricular Rate:  114 PR Interval:  192 QRS Duration:  98 QT Interval:  352 QTC Calculation: 485 R Axis:   28 Text Interpretation:  Sinus tachycardia with Premature atrial complexes in a pattern of  bigeminy ST \\T \ T wave abnormality, consider inferolateral ischemia When compared with ECG of 23-Jul-2013 06:02, Premature atrial complexes are now Present Vent. rate has increased BY  48 BPM ST now depressed in Lateral leads T wave inversion less evident in Anterior leads Inverted T waves have replaced nonspecific T wave abnormality in Lateral leads Confirmed by Shakeita Vandevander  MD-I, Ashima Shrake ([id number]) on 01/16/2014 7:07:41 PM            MDM  patient with dementia presents with her daughter with vague complaints of not looking right around the eyes and moving slower than usual. Patient is found to be very anemic at which point daughter describes black stools. Patient has EKG changes and positive cardiac enzymes consistent with cardiac ischemia most likely from her anemia. Patient however has no chest pain or shortness of breath. She was transfused with blood. He is being admitted for further evaluation to find the allergy of her bleeding she is on aspirin daily 325 mg tablet plus she is getting aspirin and her Aggrenox. She most likely has a gastric ulcer with bleeding. Patient has a metabolic acidosis that may be related to her acute on chronic renal insufficiency. She did not have hypotension in the emergency department. She did have some tachycardia.    Final diagnoses:  GI bleeding  Weakness  Acute on chronic renal insufficiency  Metabolic acidosis  Anemia  Cardiac ischemia   Plan admission  CRITICAL CARE Performed by: Rolland Porter L Total critical care time: 37 min Critical care time was exclusive of separately billable procedures and treating other patients. Critical care was necessary to treat or prevent imminent or life-threatening deterioration. Critical care was time spent personally by me on the following activities: development of treatment plan with patient and/or surrogate as well as nursing, discussions with consultants, evaluation of patient's response to treatment, examination of patient,  obtaining history from patient or surrogate, ordering and performing treatments and interventions, ordering and review of laboratory studies, ordering and review of radiographic studies, pulse oximetry and re-evaluation of patient's condition.    I personally performed the services described in this documentation, which was scribed in my presence. The recorded information has been reviewed and considered.  Rolland Porter, MD, FACEP  .   Janice Norrie, MD 01/17/14 [phone]

## 2014-01-16 NOTE — H&P (Signed)
PCP:   [name], MD   Chief Complaint:  weakness  HPI: 73 yo female h/o cva, fe def anemia, htn, dm, sz disorder comes in with daughter for more weakness than usual.  Usually walks with walker with assistance.  Lives with dtr.  No fevers.  No n/v.  No cp.  No sob.  No swelling.  No abd pain.  dtr did note some darker than usual stool yesterday, but normal today.  No brbpr.  Her last iron infusion for her anemia was in October.  No syncope or loc.  dtr noticed just very weak today.  Just had bm which was normal caliber and color, viewed by myself.  Pt denies any pain anywhere.  Review of Systems:  Positive and negative as per HPI otherwise all other systems are negative  Past Medical History: Past Medical History  Diagnosis Date  . Diabetes mellitus   . Hypertension   . Stroke   . MI, old   . Renal disorder   . Seizure disorder    Past Surgical History  Procedure Laterality Date  . Coronary artery bypass graft    . Tee without cardioversion  01/28/2012    Procedure: TRANSESOPHAGEAL ECHOCARDIOGRAM (TEE);  Surgeon: Lelon Perla, MD;  Location: [facility] ENDOSCOPY;  Service: Cardiovascular;  Laterality: [name];    Medications: Prior to Admission medications   Medication Sig Start Date End Date Taking? Authorizing Provider  aspirin 325 MG tablet Take 325 mg by mouth daily.   Yes Historical Provider, MD  dipyridamole-aspirin (AGGRENOX) 25-200 MG per 12 hr capsule Take 1 capsule by mouth 2 (two) times daily.   Yes Historical Provider, MD  donepezil (ARICEPT) 5 MG tablet Take 5 mg by mouth daily.   Yes Historical Provider, MD  folic acid (FOLVITE) 1 MG tablet Take 1 mg by mouth daily.   Yes Historical Provider, MD  furosemide (LASIX) 20 MG tablet Take 1 tablet (20 mg total) by mouth daily. 07/25/13  Yes Kathie Dike, MD  glyBURIDE (DIABETA) 2.5 MG tablet Take 2.5 mg by mouth daily with breakfast.   Yes Historical Provider, MD  hydrALAZINE (APRESOLINE) 50 MG tablet Take 1 tablet (50 mg  total) by mouth every 8 (eight) hours. 12/23/12  Yes Lezlie Octave Black, NP  levETIRAcetam (KEPPRA) 100 MG/ML solution Take 5 mLs by mouth 2 (two) times daily. 01/13/14  Yes Historical Provider, MD  metFORMIN (GLUCOPHAGE) 500 MG tablet Take 500 mg by mouth daily. 01/02/14  Yes Historical Provider, MD  metoprolol (TOPROL-XL) 100 MG 24 hr tablet Take 50 mg by mouth daily.    Yes Historical Provider, MD  potassium chloride (K-DUR) 10 MEQ tablet Take 1 tablet (10 mEq total) by mouth daily. 07/25/13  Yes Kathie Dike, MD  pravastatin (PRAVACHOL) 20 MG tablet Take 20 mg by mouth daily.     Yes Historical Provider, MD  sodium bicarbonate 650 MG tablet Take 650 mg by mouth 3 (three) times daily.   Yes Historical Provider, MD  Vitamin D, Ergocalciferol, (DRISDOL) 50000 UNITS CAPS Take 50,000 Units by mouth every 7 (seven) days. Takes on Tuesday    Historical Provider, MD    Allergies:  No Known Allergies  Social History:  reports that she has quit smoking. Her smoking use included Cigarettes. She has a 50 pack-year smoking history. She does not have any smokeless tobacco history on file. She reports that she does not drink alcohol or use illicit drugs.  Family History: Family History  Problem Relation Age of  Onset  . Stroke Father   . Diabetes type II Mother     Physical Exam: Filed Vitals:   01/16/14 1700 01/16/14 1758 01/16/14 1800 01/16/14 1900  BP: 152/52 152/52 111/89 108/38  Pulse:  60    Temp:      TempSrc:      Resp: 21 20 26 20   Height:      Weight:      SpO2:  98%     General appearance: alert, cooperative and no distress Head: Normocephalic, without obvious abnormality, atraumatic Eyes: negative Nose: Nares normal. Septum midline. Mucosa normal. No drainage or sinus tenderness. Neck: no JVD and supple, symmetrical, trachea midline Lungs: clear to auscultation bilaterally Heart: regular rate and rhythm, S1, S2 normal, no murmur, click, rub or gallop Abdomen: soft, non-tender; bowel  sounds normal; no masses,  no organomegaly Extremities: extremities normal, atraumatic, no cyanosis or edema Pulses: 2+ and symmetric Skin: Skin color, texture, turgor normal. No rashes or lesions Neurologic: Grossly normal    Labs on Admission:   Recent Labs  01/16/14 1706  NA 141  K 4.6  CL 111  CO2 13*  GLUCOSE 135*  BUN 64*  CREATININE 3.02*  CALCIUM 8.7  MG 2.5    Recent Labs  01/16/14 1706  AST 16  ALT 8  ALKPHOS 41  BILITOT 0.2*  PROT 8.7*  ALBUMIN 3.3*    Recent Labs  01/16/14 1706  WBC 6.7  NEUTROABS 5.4  HGB 4.8*  HCT 15.9*  MCV 90.9  PLT 334    Recent Labs  01/16/14 1706  TROPONINI 0.42*   Radiological Exams on Admission: Ct Head Wo Contrast  01/16/2014   CLINICAL DATA:  Altered mental status.  EXAM: CT HEAD WITHOUT CONTRAST  TECHNIQUE: Contiguous axial images were obtained from the base of the skull through the vertex without intravenous contrast.  COMPARISON:  Head CT scan 09/03/2012.  FINDINGS: The brain is atrophic with chronic microvascular ischemic change. 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 Remote basal ganglia lacunar infarctions are noted. No evidence of acute abnormality including infarct, hemorrhage, mass lesion, mass effect, midline shift or abnormal extra-axial fluid collection. No hydrocephalus or pneumocephalus. The calvarium is intact.  IMPRESSION: No acute finding.  Stable compared to prior exam.   Electronically Signed   By: Inge Rise M.D.   On: 01/16/2014 16:26    Assessment/Plan  73 yo female with acute on chronic anemia from possible GIB with nstemi likely from demand ischemia  Principal Problem:   GI bleeding-  No overt bleeding now.  Heme neg.  Will transfuse 2 units tonight.  Monitor in stepdown,  hgb down to 4.8,  vss.  Gi consulted in am.  Anticoagulation for her nstemi contraindicated for this reason at this time.  Her worsening anemia may also just be chronic.  Anemia panel is pending.  Keep npo  after midnight in case need for any scoping.   Active Problems:  Stable unless o/w noted   Diabetes mellitus   Seizure disorder   CVA (cerebral infarction)   CKD (chronic kidney disease) stage 3, GFR 30-59 ml/min  Mildly worse likely due to volume depletion, repeat in am after transfusion   Acute on chronic renal failure  As above  Baseline Cr 2.5, now 3   Weakness generalized   Anemia associated with acute blood loss  As above   NSTEMI (non-ST elevated myocardial infarction)  Serial ce.  Ck echo in am, last ef 40% in 2013.  Cannot anticoagulate due to possible gib, hold asa products as well.   Systolic CHF, chronic last EF 40% 3/13- compensated at this time, cont her home lasix, transfuse 2 units prbc tonight, no ivf unless bp drops   Iron deficiency anemia  Last iron infusion in oct 2014.  Full code  [place] A 01/16/2014, 8:05 PM

## 2014-01-16 NOTE — ED Notes (Signed)
Pt daughter states pt has seemed "different" and "slower" since Friday. Reports pt fell yesterday and seems more confused today.

## 2014-01-17 ENCOUNTER — Encounter (HOSPITAL_COMMUNITY): Payer: Self-pay | Admitting: Gastroenterology

## 2014-01-17 DIAGNOSIS — D649 Anemia, unspecified: Secondary | ICD-10-CM

## 2014-01-17 DIAGNOSIS — I059 Rheumatic mitral valve disease, unspecified: Secondary | ICD-10-CM

## 2014-01-17 DIAGNOSIS — I10 Essential (primary) hypertension: Secondary | ICD-10-CM

## 2014-01-17 LAB — GLUCOSE, CAPILLARY
GLUCOSE-CAPILLARY: 167 mg/dL — AB (ref 70–99)
Glucose-Capillary: 161 mg/dL — ABNORMAL HIGH (ref 70–99)

## 2014-01-17 LAB — TROPONIN I
TROPONIN I: 0.76 ng/mL — AB (ref <0.30)
TROPONIN I: 0.39 ng/mL — AB (ref <0.30)
TROPONIN I: 0.74 ng/mL — AB (ref <0.30)

## 2014-01-17 LAB — BASIC METABOLIC PANEL
BUN: 57 mg/dL — ABNORMAL HIGH (ref 6–23)
CO2: 13 mEq/L — ABNORMAL LOW (ref 19–32)
Calcium: 8.4 mg/dL (ref 8.4–10.5)
Chloride: 117 mEq/L — ABNORMAL HIGH (ref 96–112)
Creatinine, Ser: 2.73 mg/dL — ABNORMAL HIGH (ref 0.50–1.10)
GFR calc Af Amer: 19 mL/min — ABNORMAL LOW (ref >90)
GFR, EST NON AFRICAN AMERICAN: 16 mL/min — AB (ref >90)
Glucose, Bld: 145 mg/dL — ABNORMAL HIGH (ref 70–99)
Potassium: 5.1 mEq/L (ref 3.7–5.3)
SODIUM: 144 meq/L (ref 137–147)

## 2014-01-17 LAB — CBC
HCT: 25.1 % — ABNORMAL LOW (ref 36.0–46.0)
HCT: 27.9 % — ABNORMAL LOW (ref 36.0–46.0)
Hemoglobin: 8 g/dL — ABNORMAL LOW (ref 12.0–15.0)
Hemoglobin: 9.1 g/dL — ABNORMAL LOW (ref 12.0–15.0)
MCH: 28.2 pg (ref 26.0–34.0)
MCH: 28.4 pg (ref 26.0–34.0)
MCHC: 31.9 g/dL (ref 30.0–36.0)
MCHC: 32.6 g/dL (ref 30.0–36.0)
MCV: 88.4 fL (ref 78.0–100.0)
MCV: 87.2 fL (ref 78.0–100.0)
PLATELETS: 282 10*3/uL (ref 150–400)
PLATELETS: 275 10*3/uL (ref 150–400)
RBC: 2.84 MIL/uL — AB (ref 3.87–5.11)
RBC: 3.2 MIL/uL — ABNORMAL LOW (ref 3.87–5.11)
RDW: 16.1 % — ABNORMAL HIGH (ref 11.5–15.5)
RDW: 16.4 % — ABNORMAL HIGH (ref 11.5–15.5)
WBC: 5.2 10*3/uL (ref 4.0–10.5)
WBC: 4.2 10*3/uL (ref 4.0–10.5)

## 2014-01-17 LAB — FERRITIN: Ferritin: 28 ng/mL (ref 10–291)

## 2014-01-17 LAB — HEMOGLOBIN AND HEMATOCRIT, BLOOD
HCT: 25.7 % — ABNORMAL LOW (ref 36.0–46.0)
HCT: 24.1 % — ABNORMAL LOW (ref 36.0–46.0)
Hemoglobin: 8.4 g/dL — ABNORMAL LOW (ref 12.0–15.0)
Hemoglobin: 7.7 g/dL — ABNORMAL LOW (ref 12.0–15.0)

## 2014-01-17 LAB — VITAMIN B12: Vitamin B-12: 460 pg/mL (ref 211–911)

## 2014-01-17 LAB — OCCULT BLOOD, POC DEVICE: Fecal Occult Bld: POSITIVE — AB

## 2014-01-17 LAB — FOLATE: Folate: >20 ng/mL

## 2014-01-17 LAB — IRON AND TIBC
IRON: 12 ug/dL — AB (ref 42–135)
SATURATION RATIOS: 4 % — AB (ref 20–55)
TIBC: 322 ug/dL (ref 250–470)
UIBC: 310 ug/dL (ref 125–400)

## 2014-01-17 LAB — PREPARE RBC (CROSSMATCH)

## 2014-01-17 LAB — MRSA PCR SCREENING: MRSA BY PCR: NEGATIVE

## 2014-01-17 MED ORDER — PANTOPRAZOLE SODIUM 40 MG PO TBEC
40.0000 mg | DELAYED_RELEASE_TABLET | Freq: Every day | ORAL | Status: DC
  Administered 2014-01-17 – 2014-01-19 (×3): 40 mg via ORAL
  Filled 2014-01-17 (×3): qty 1

## 2014-01-17 MED ORDER — INSULIN ASPART 100 UNIT/ML ~~LOC~~ SOLN
0.0000 [IU] | Freq: Three times a day (TID) | SUBCUTANEOUS | Status: DC
  Administered 2014-01-17 – 2014-01-18 (×2): 2 [IU] via SUBCUTANEOUS
  Administered 2014-01-18 – 2014-01-19 (×3): 1 [IU] via SUBCUTANEOUS

## 2014-01-17 MED ORDER — ISOSORBIDE MONONITRATE ER 30 MG PO TB24
15.0000 mg | ORAL_TABLET | Freq: Every day | ORAL | Status: DC
  Administered 2014-01-17 – 2014-01-19 (×3): 15 mg via ORAL
  Filled 2014-01-17 (×3): qty 1

## 2014-01-17 MED ORDER — SODIUM BICARBONATE 8.4 % IV SOLN
INTRAVENOUS | Status: AC
  Administered 2014-01-17: 14:00:00 via INTRAVENOUS
  Filled 2014-01-17 (×2): qty 150

## 2014-01-17 MED ORDER — FUROSEMIDE 20 MG PO TABS: 20.0000 mg | ORAL_TABLET | Freq: Every day | ORAL | Status: DC

## 2014-01-17 MED ORDER — STERILE WATER FOR INJECTION IV SOLN: INTRAVENOUS | Status: DC

## 2014-01-17 NOTE — Progress Notes (Signed)
[facility] is on national backorder and [facility] has depleted its stock.  Dr Algis Liming called & made aware.  IVF order changed to D5W + 170mEq of NaBicarb at 49ml/hr x 18 hrs.    Netta Cedars, PharmD, BCPS 01/17/2014@1 :05 PM

## 2014-01-17 NOTE — Progress Notes (Signed)
1 unit prbc started.

## 2014-01-17 NOTE — Progress Notes (Addendum)
PROGRESS NOTE    [name] ZOX:[id number] DOB: 1941/03/29 DOA: 01/16/2014 PCP: [name], MD  HPI/Brief narrative 73 year old female patient with history of GI bleed in 2002 when she was on Coumadin for cerebrovascular disease-found to have erosive gastritis and duodenitis by EGD, multivessel CAD status post CABG 2002, last LVEF 40-45% in March 2013, type II DM, CVA, seizure disorder, iron deficiency anemia, HTN, HLD, stage IV chronic kidney disease, admitted on 01/16/14 with complaints of generalized weakness and found to have hemoglobin of 4.8. She lives with her daughter and ambulates with the help of a walker. No reported chest pain, dyspnea, nausea, vomiting. Daughter did notice some darker than normal stool on 2/22 but no BRBPR. DM in the ED was normal colored and FOBT negative x1, positive x1. She was admitted for worsening anemia, acute on chronic kidney disease.  Assessment/Plan:  1. Acute on chronic iron deficiency anemia:? Slow GI bleed in the context of chronic kidney disease. Patient states that she had a dark stool this morning. Anemia panel pending. FOBT in the ED negative x1 then positive x1. Status post 2 units PRBCs overnight-hemoglobin improved from 4.8 > 7.7 g per DL. We'll transfuse an additional unit of PRBC given NSTEMI and aim to keep hemoglobin greater than 8. GI consultation appreciated-possible EGD/colonoscopy this admission. Cardiology has cleared for potential EGD/colonoscopy. 2. Possible GI bleed: Management as above. Antiplatelet agents held. Add PPI. Discussed with GI. 3. Acute on stage IV chronic kidney disease: Likely secondary to volume depletion. Brief IV fluids. Improving. Follow daily BMP. Hold diuretics temporarily. Baseline creatinine probably in the 2.2 range. 4. NSTEMI, most likely type 2/multivessel CAD/CABG/ischemic cardiomyopathy (LVEF 40-45% in 2013): In the setting of severe symptomatic anemia and acute on chronic renal failure. Denies chest  pain and no acute findings an EKG. Peak troponin of 0.74. Cardiology input appreciated. Holding antiplatelets and anticoagulants secondary to GI bleed issues. Continue beta blockers and hydralazine. Cardiology adding long-acting nitrates. No ACE inhibitor so ARB secondary to renal insufficiency. Follow 2-D echo. Not a candidate for invasive cardiac evaluation at this time. Discussed with cardiology. 5. 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 6. Hypertension: Reasonably controlled. 7. History of CVA: Holding antiplatelets secondary to GI bleed issues and anemia. 8. Dementia: Possibly multi-infarct. 9. Non-anion gap metabolic acidosis: Unclear etiology. Hydrate with bicarbonate drip and follow BMP in a.m. 10. Seizure disorder: Continue Keppra. 11. History of chronic systolic CHF: Currently clinically dehydrated. Hold Lasix. Brief IV fluids and monitor closely. 12. Dehydration: Brief IV fluids.   Code Status: Full Family Communication: None at bedside Disposition Plan: Home when medically stable   Consultants:  Cardiology  Gastroenterology  Procedures:  None  Antibiotics:  None   Subjective: Denies complaints. States that she feels stronger. States that she had a dark BM this morning. Denies weight loss and appetite apparently good. No chest pain, dyspnea or palpitations.  Objective: Filed Vitals:   01/17/14 0730 01/17/14 0800 01/17/14 0900 01/17/14 1000  BP:  149/49 159/88 145/54  Pulse:    61  Temp: 98.6 F (37 C)     TempSrc: Oral     Resp:      Height:      Weight:      SpO2:    92%    Intake/Output Summary (Last 24 hours) at 01/17/14 1227 Last data filed at 01/17/14 1000  Gross per 24 hour  Intake 1404.66 ml  Output    451 ml  Net 953.66  ml   Filed Weights   01/16/14 1529 01/16/14 2206 01/17/14 0500  Weight: 63.504 kg (140 lb) 66.4 kg (146 lb 6.2 oz) 67.5 kg (148 lb 13 oz)     Exam:  General exam: Chronically ill-appearing elderly female lying  comfortably in bed. Oral mucosa appears mildly dry and skin turgor seems to be diminished. Respiratory system: Clear. No increased work of breathing. Cardiovascular system: S1 & S2 heard, RRR. No JVD, murmurs, gallops, clicks or pedal edema. Telemetry: Sinus rhythm-sinus tachycardia in the 100s. Gastrointestinal system: Abdomen is nondistended, soft and nontender. Normal bowel sounds heard. Central nervous system: Alert and oriented x2. No focal neurological deficits. Extremities: Symmetric 5 x 5 power.   Data Reviewed: Basic Metabolic Panel:  Recent Labs Lab 01/16/14 1706 01/17/14 0612  NA 141 144  K 4.6 5.1  CL 111 117*  CO2 13* 13*  GLUCOSE 135* 145*  BUN 64* 57*  CREATININE 3.02* 2.73*  CALCIUM 8.7 8.4  MG 2.5  --    Liver Function Tests:  Recent Labs Lab 01/16/14 1706  AST 16  ALT 8  ALKPHOS 41  BILITOT 0.2*  PROT 8.7*  ALBUMIN 3.3*   No results found for this basename: LIPASE, AMYLASE,  in the last 168 hours No results found for this basename: AMMONIA,  in the last 168 hours CBC:  Recent Labs Lab 01/16/14 1706 01/17/14 0345 01/17/14 0612 01/17/14 1141  WBC 6.7  --  5.2  --   NEUTROABS 5.4  --   --   --   HGB 4.8* 8.4* 8.0* 7.7*  HCT 15.9* 25.7* 25.1* 24.1*  MCV 90.9  --  88.4  --   PLT 334  --  282  --    Cardiac Enzymes:  Recent Labs Lab 01/16/14 1706 01/17/14 0005 01/17/14 0612 01/17/14 1141  TROPONINI 0.42* 0.39* 0.74* 0.76*   BNP (last 3 results)  Recent Labs  07/22/13 1420  PROBNP 27695.0*   CBG: No results found for this basename: GLUCAP,  in the last 168 hours  Recent Results (from the past 240 hour(s))  MRSA PCR SCREENING     Status: None   Collection Time    01/16/14 10:30 PM      Result Value Ref Range Status   MRSA by PCR NEGATIVE  NEGATIVE Final   Comment:            The GeneXpert MRSA Assay (FDA     approved for NASAL specimens     only), is one component of a     comprehensive MRSA colonization     surveillance  program. It is not     intended to diagnose MRSA     infection nor to guide or     monitor treatment for     MRSA infections.      Additional labs: 1. Venous lactate on admission: 2.7 2. Reticulocytes: 109.     Studies: Ct Head Wo Contrast  01/16/2014   CLINICAL DATA:  Altered mental status.  EXAM: CT HEAD WITHOUT CONTRAST  TECHNIQUE: Contiguous axial images were obtained from the base of the skull through the vertex without intravenous contrast.  COMPARISON:  Head CT scan 09/03/2012.  FINDINGS: The brain is atrophic with chronic microvascular ischemic change. 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 Remote basal ganglia lacunar infarctions are noted. No evidence of acute abnormality including infarct, hemorrhage, mass lesion, mass effect, midline shift or abnormal extra-axial fluid collection. No hydrocephalus or pneumocephalus. The calvarium is  intact.  IMPRESSION: No acute finding.  Stable compared to prior exam.   Electronically Signed   By: Inge Rise M.D.   On: 01/16/2014 16:26        Scheduled Meds: . donepezil  5 mg Oral Daily  . [START ON 01/18/2014] furosemide  20 mg Oral Daily  . hydrALAZINE  50 mg Oral 3 times per day  . isosorbide mononitrate  15 mg Oral Daily  . levETIRAcetam  500 mg Oral BID  . metoprolol succinate  50 mg Oral Daily  . pantoprazole  40 mg Oral Daily  . sodium chloride  3 mL Intravenous Q12H   Continuous Infusions:   Principal Problem:   GI bleeding Active Problems:   Diabetes mellitus   Seizure disorder   CVA (cerebral infarction)   CKD (chronic kidney disease) stage 3, GFR 30-59 ml/min   Acute on chronic renal failure   Weakness generalized   Anemia associated with acute blood loss   NSTEMI (non-ST elevated myocardial infarction)   Systolic CHF, chronic last EF 40% 3/13   Iron deficiency anemia    Time spent: 20 minutes    Shell Yandow, MD, FACP, FHM. [facility] Pager [phone]  If 7PM-7AM, please  contact night-coverage [email or web address] Password [facility] 01/17/2014, 12:27 PM    LOS: 1 day

## 2014-01-17 NOTE — Plan of Care (Signed)
Problem: Consults Goal: GI Bleeding Patient Education See Patient Education Module for education specifics. Outcome: Progressing No visible signs of bleeding, patient voiding.  One bout of diarrhea in ED, no further on the floor, negative for occult blood in ED. Goal: Nutrition Consult-if indicated Outcome: Progressing Patient NPO for possible GI consult in am  Problem: Phase I Progression Outcomes Goal: Pain controlled with appropriate interventions Outcome: Not Applicable Date Met:  26/33/35 No complaints of pain Goal: OOB as tolerated unless otherwise ordered Outcome: Not Progressing Strict bedrest, uses a walker at home Goal: Initial discharge plan identified Outcome: Progressing Lives with daughter [name] and one grandchild

## 2014-01-17 NOTE — Consult Note (Signed)
Referring Provider: Dr. Derrill Kay Primary Care Physician:  Kimberly Font, MD Primary Gastroenterologist:  Dr. Oneida [name]   Date of Admission: 01/16/14 Date of Consultation: 01/17/14  Reason for Consultation: Acute on chronic anemia  HPI:  [name] is a pleasant 73 year old female with a history of dementia that presented with her daughter to the ED secondary to progressive weakness. Patient confused, which is at her baseline. She is unable to tell me why she is here or any pertinent medical history. I contacted the daughter, [name], and spoke with her for further details.   Daughter states she was weak, felt it was best to bring to the hospital. Denies any reports of abdominal pain, N/V. Saturday saw "dark stool". Monday normal bowel movements. BM again in ED, which was brown. Appetite good. Daughter states intermittent constipation but no significant change in bowel habits. Daughter thinks she may have had a colonoscopy in the remote past, at least 10 years ago. Aggrenox as outpatient.   Admitting Hgb 4.8 with improvement to 8 range after 2 units PRBCs. Appears baseline Hgb has been in the 8/9 range after review of medical records dating back to Jan 2014. Troponin with increase to 0.74 this morning, cardiology consulted. Anemia panel pending. Heme negative.   Past Medical History  Diagnosis Date  . Diabetes mellitus   . Hypertension   . Stroke   . MI, old   . Renal disorder   . Seizure disorder     Past Surgical History  Procedure Laterality Date  . Coronary artery bypass graft    . Tee without cardioversion  01/28/2012    Procedure: TRANSESOPHAGEAL ECHOCARDIOGRAM (TEE);  Surgeon: Lelon Perla, MD;  Location: [facility] ENDOSCOPY;  Service: Cardiovascular;  Laterality: [name];    Prior to Admission medications   Medication Sig Start Date End Date Taking? Authorizing Provider  aspirin 325 MG tablet Take 325 mg by mouth daily.   Yes Historical Provider, MD   dipyridamole-aspirin (AGGRENOX) 25-200 MG per 12 hr capsule Take 1 capsule by mouth 2 (two) times daily.   Yes Historical Provider, MD  donepezil (ARICEPT) 5 MG tablet Take 5 mg by mouth daily.   Yes Historical Provider, MD  folic acid (FOLVITE) 1 MG tablet Take 1 mg by mouth daily.   Yes Historical Provider, MD  furosemide (LASIX) 20 MG tablet Take 1 tablet (20 mg total) by mouth daily. 07/25/13  Yes Kathie Dike, MD  glyBURIDE (DIABETA) 2.5 MG tablet Take 2.5 mg by mouth daily with breakfast.   Yes Historical Provider, MD  hydrALAZINE (APRESOLINE) 50 MG tablet Take 1 tablet (50 mg total) by mouth every 8 (eight) hours. 12/23/12  Yes Lezlie Octave Black, NP  levETIRAcetam (KEPPRA) 100 MG/ML solution Take 5 mLs by mouth 2 (two) times daily. 01/13/14  Yes Historical Provider, MD  metFORMIN (GLUCOPHAGE) 500 MG tablet Take 500 mg by mouth daily. 01/02/14  Yes Historical Provider, MD  metoprolol (TOPROL-XL) 100 MG 24 hr tablet Take 50 mg by mouth daily.    Yes Historical Provider, MD  potassium chloride (K-DUR) 10 MEQ tablet Take 1 tablet (10 mEq total) by mouth daily. 07/25/13  Yes Kathie Dike, MD  pravastatin (PRAVACHOL) 20 MG tablet Take 20 mg by mouth daily.     Yes Historical Provider, MD  sodium bicarbonate 650 MG tablet Take 650 mg by mouth 3 (three) times daily.   Yes Historical Provider, MD  Vitamin D, Ergocalciferol, (DRISDOL) 50000 UNITS CAPS Take 50,000  Units by mouth every 7 (seven) days. Takes on Tuesday    Historical Provider, MD    Current Facility-Administered Medications  Medication Dose Route Frequency Provider Last Rate Last Dose  . 0.9 %  sodium chloride infusion  250 mL Intravenous PRN Phillips Grout, MD 10 mL/hr at 01/17/14 0800 250 mL at 01/17/14 0800  . donepezil (ARICEPT) tablet 5 mg  5 mg Oral Daily Phillips Grout, MD      . Derrill Memo ON 01/18/2014] furosemide (LASIX) tablet 20 mg  20 mg Oral Daily Modena Jansky, MD      . hydrALAZINE (APRESOLINE) tablet 50 mg  50 mg Oral 3 times  per day Phillips Grout, MD   50 mg at 01/17/14 0610  . levETIRAcetam (KEPPRA) 100 MG/ML solution 500 mg  500 mg Oral BID Phillips Grout, MD   500 mg at 01/16/14 2238  . metoprolol succinate (TOPROL-XL) 24 hr tablet 50 mg  50 mg Oral Daily Rachal A Shanon Brow, MD      . sodium chloride 0.9 % injection 3 mL  3 mL Intravenous Q12H Phillips Grout, MD   3 mL at 01/16/14 2246  . sodium chloride 0.9 % injection 3 mL  3 mL Intravenous PRN Phillips Grout, MD        Allergies as of 01/16/2014  . (No Known Allergies)    Family History  Problem Relation Age of Onset  . Stroke Father   . Diabetes type II Mother   . Colon cancer Neg Hx     History   Social History  . Marital Status: Single    Spouse Name: [name]    Number of Children: [name]  . Years of Education: [name]   Occupational History  . Not on file.   Social History Main Topics  . Smoking status: Former Smoker -- 1.00 packs/day for 50 years    Types: Cigarettes  . Smokeless tobacco: Not on file  . Alcohol Use: No  . Drug Use: No  . Sexual Activity: Not on file   Other Topics Concern  . Not on file   Social History Narrative  . No narrative on file    Review of Systems: Unable to obtain due to cognitive status.   Physical Exam: Vital signs in last 24 hours: Temp:  [97.5 F (36.4 C)-99.1 F (37.3 C)] 98.6 F (37 C) (02/24 0730) Pulse Rate:  [52-110] 105 (02/24 0100) Resp:  [15-26] 19 (02/24 0700) BP: (73-160)/(32-118) 159/94 mmHg (02/24 0700) SpO2:  [97 %-100 %] 99 % (02/24 0600) Weight:  [140 lb (63.504 kg)-148 lb 13 oz (67.5 kg)] 148 lb 13 oz (67.5 kg) (02/24 0500) Last BM Date: 01/16/14 General:   Alert to person only. Pleasantly confused.  Head:  Normocephalic and atraumatic. Eyes:  Sclera clear, no icterus.   Conjunctiva pink. Ears:  Normal auditory acuity. Nose:  No deformity, discharge,  or lesions. Mouth:  No deformity or lesions, poor dentition.  Neck:  Supple; no masses or thyromegaly. Lungs:  Scattered  rhonchi, mild expiratory wheeze bilaterally. Heart:  S1 S2 present, irregularly irregular. EKG with multiple PVCs.  Abdomen:  Soft, nontender and nondistended. No masses, hepatosplenomegaly or hernias noted. Normal bowel sounds, without guarding, and without rebound.   Rectal:  Deferred until time of colonoscopy.   Msk:  Symmetrical without gross deformities. Normal posture. Extremities:  Without clubbing or edema. Neurologic:  Alert and  oriented to person only. Skin:  Intact without significant lesions or  rashes. Cervical Nodes:  No significant cervical adenopathy. Psych:  Alert and cooperative.   Intake/Output from previous day: 02/23 0701 - 02/24 0700 In: 1374.7 [I.V.:81.3; Blood:1293.3] Out: 451 [Urine:450; Stool:1] Intake/Output this shift: Total I/O In: 10 [I.V.:10] Out: -   Lab Results:  Recent Labs  01/16/14 1706 01/17/14 0345 01/17/14 0612  WBC 6.7  --  5.2  HGB 4.8* 8.4* 8.0*  HCT 15.9* 25.7* 25.1*  PLT 334  --  282   BMET  Recent Labs  01/16/14 1706 01/17/14 0612  NA 141 144  K 4.6 5.1  CL 111 117*  CO2 13* 13*  GLUCOSE 135* 145*  BUN 64* 57*  CREATININE 3.02* 2.73*  CALCIUM 8.7 8.4   LFT  Recent Labs  01/16/14 1706  PROT 8.7*  ALBUMIN 3.3*  AST 16  ALT 8  ALKPHOS 41  BILITOT 0.2*   PT/INR  Recent Labs  01/16/14 1706  LABPROT 15.1  INR 1.22    Studies/Results: Ct Head Wo Contrast  01/16/2014   CLINICAL DATA:  Altered mental status.  EXAM: CT HEAD WITHOUT CONTRAST  TECHNIQUE: Contiguous axial images were obtained from the base of the skull through the vertex without intravenous contrast.  COMPARISON:  Head CT scan 09/03/2012.  FINDINGS: The brain is atrophic with chronic microvascular ischemic change. 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 Remote basal ganglia lacunar infarctions are noted. No evidence of acute abnormality including infarct, hemorrhage, mass lesion, mass effect, midline shift or abnormal extra-axial fluid  collection. No hydrocephalus or pneumocephalus. The calvarium is intact.  IMPRESSION: No acute finding.  Stable compared to prior exam.   Electronically Signed   By: Inge Rise M.D.   On: 01/16/2014 16:26    Impression: 73 year old female admitted with profound, acute on chronic anemia but heme negative stool. Due to cognitive status, unable to illicit an accurate history. After discussion with daughter, she notes "dark stool" a few days ago but no further evidence of overt GI bleeding since then or this admission. Heme negative this admission.   Appears she takes Aggrenox as an outpatient, and her last colonoscopy was in the remote past if at all per daughter.  Bump in troponin likely secondary to demand ischemia. She ultimately needs a colonoscopy with possible endoscopy this admission, which could be tentatively planned for 2/25 after cardiology clearance. No urgent need for invasive procedure unless evidence of overt GI bleeding.   Plan: Cardiology consult as planned Follow H/H, transfuse as needed Add PPI for GI prophylaxis Follow-up on pending anemia panel Consider colonoscopy +/- EGD this admission, possibly 2/25, if appropriate per cardiology.  Will allow clear liquids now. Will continue to follow with you.   Orvil Feil, ANP-BC [facility]     LOS: 1 day    01/17/2014, 8:24 AM    Addendum at 1500: appears patient underwent an EGD and colonoscopy in 2002. Actual operative notes not available but discharge summaries report this. Gastritis and duodenitis noted, internal hemorrhoids.  Orvil Feil, ANP-BC [facility]

## 2014-01-17 NOTE — Progress Notes (Signed)
*  PRELIMINARY RESULTS* Echocardiogram 2D Echocardiogram has been performed.  [place], [place] 01/17/2014, 10:36 AM

## 2014-01-17 NOTE — Consult Note (Signed)
Consulting cardiologist: Dr. Jonelle Sidle  Clinical Summary Ms. Galdamez is a medically complex 73 y.o.female with history outlined below, now admitted to the hospital with progressive weakness and recent reported melena. She was initially found to be in heme negative, however hemoglobin was 4.8, up to 8.4 following PRBC transfusion. Gastroenterology has been consulted, plans for probable EGD tomorrow.  As part of her workup, cardiac markers were obtained and found to be abnormal as outlined below. Peak troponin I only 0.74 at this time. She denies any chest pain. ECG does show potential ischemic changes in the inferolateral leads compared to prior tracing from September 2014.  I reviewed her history. She had a prior GI bleed back in 2002 when she was on Coumadin for cerebrovascular disease. At that time she was found to have erosive gastritis and duodenitis by EGD. Cardiac history includes multivessel disease status post CABG in 2002 as detailed below. Last LVEF was 40-45% as of March 2013. Followup echocardiogram is pending. She has not had regular cardiology followup, previously saw [name].  She has recently been on Aggrenox as an outpatient.  No Known Allergies  Medications Scheduled Medications: . donepezil  5 mg Oral Daily  . [START ON 01/18/2014] furosemide  20 mg Oral Daily  . hydrALAZINE  50 mg Oral 3 times per day  . levETIRAcetam  500 mg Oral BID  . metoprolol succinate  50 mg Oral Daily  . pantoprazole  40 mg Oral Daily  . sodium chloride  3 mL Intravenous Q12H     PRN Medications: sodium chloride, sodium chloride   Past Medical History  Diagnosis Date  . Type 2 diabetes mellitus   . Essential hypertension, benign   . History of stroke     Previously on Coumadin  . MI, old     Reported 56  . Seizure disorder   . Coronary atherosclerosis of native coronary artery     Multivessel status post CABG 2002  . History of GI bleed     Erosive gastritis and  duodenitis by EGD 2002  . Mixed hyperlipidemia   . Ischemic cardiomyopathy     LVEF 40-45% March 2013  . Dementia     Past Surgical History  Procedure Laterality Date  . Coronary artery bypass graft  July 2002    LIMA to LAD, SVG to OM1, SVG to OM 2, SVG to RCA  . Tee without cardioversion  01/28/2012    Procedure: TRANSESOPHAGEAL ECHOCARDIOGRAM (TEE);  Surgeon: Lewayne Bunting, MD;  Location: [facility] ENDOSCOPY;  Service: Cardiovascular;  Laterality: [name];    Family History  Problem Relation Age of Onset  . Stroke Father   . Diabetes type II Mother   . Colon cancer Neg Hx     Social History Ms. Calderone reports that she has quit smoking. Her smoking use included Cigarettes. She has a 50 pack-year smoking history. She does not have any smokeless tobacco history on file. Ms. Tivnan reports that she does not drink alcohol.  Review of Systems Difficult to obtain in the setting of dementia. Patient denies any active chest pain or shortness of breath, no palpitations or obvious syncope.  Physical Examination Blood pressure 159/94, pulse 105, temperature 98.6 F (37 C), temperature source Oral, resp. rate 19, height 5\' 6"  (1.676 m), weight 148 lb 13 oz (67.5 kg), SpO2 99.00%.  Intake/Output Summary (Last 24 hours) at 01/17/14 0930 Last data filed at 01/17/14 0800  Gross per 24 hour  Intake 1384.66  ml  Output    451 ml  Net 933.66 ml   Telemetry: Sinus tachycardia, occasional PVCs.  Patient in no acute distress. HEENT: Conjunctiva and lids normal, oropharynx clear. Neck: Supple, no elevated JVP or carotid bruits, no thyromegaly. Lungs: Decreased breath sounds but no wheezing, nonlabored breathing at rest. Cardiac: Regular rate and rhythm with occasional ectopy, indistinct PMI, no S3, soft systolic murmur, no pericardial rub. Abdomen: Soft, nontender, bowel sounds present, no guarding or rebound. Extremities: No pitting edema, distal pulses 1-2+. Skin: Warm and dry. Musculoskeletal:  No kyphosis. Neuropsychiatric: Alert and oriented x2, calm.   Lab Results  Basic Metabolic Panel:  Recent Labs Lab 01/16/14 1706 01/17/14 0612  NA 141 144  K 4.6 5.1  CL 111 117*  CO2 13* 13*  GLUCOSE 135* 145*  BUN 64* 57*  CREATININE 3.02* 2.73*  CALCIUM 8.7 8.4  MG 2.5  --     Liver Function Tests:  Recent Labs Lab 01/16/14 1706  AST 16  ALT 8  ALKPHOS 41  BILITOT 0.2*  PROT 8.7*  ALBUMIN 3.3*    CBC:  Recent Labs Lab 01/16/14 1706 01/17/14 0345 01/17/14 0612  WBC 6.7  --  5.2  NEUTROABS 5.4  --   --   HGB 4.8* 8.4* 8.0*  HCT 15.9* 25.7* 25.1*  MCV 90.9  --  88.4  PLT 334  --  282    Cardiac Enzymes:  Recent Labs Lab 01/16/14 1706 01/17/14 0005 01/17/14 0612  TROPONINI 0.42* 0.39* 0.74*    ECG Presenting tracing shows sinus tachycardia with PACs, inferolateral ST T-wave abnormalities consistent with repolarization changes also potential ischemia. These are more pronounced compared to prior tracing in September 2014.  Imaging CT HEAD WITHOUT CONTRAST  TECHNIQUE: Contiguous axial images were obtained from the base of the skull through the vertex without intravenous contrast.  COMPARISON: Head CT scan 09/03/2012.  FINDINGS: The brain is atrophic with chronic microvascular ischemic change. 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 Remote basal ganglia lacunar infarctions are noted. No evidence of acute abnormality including infarct, hemorrhage, mass lesion, mass effect, midline shift or abnormal extra-axial fluid collection. No hydrocephalus or pneumocephalus. The calvarium is intact.  IMPRESSION: No acute finding. Stable compared to prior exam.   Impression  1. NSTEMI, most likely type II event in the setting of severe anemia and physiologic stress. She denies any active chest pain. ECG does show ischemic changes with increased heart rate.  2. Known history of multivessel CAD status post CABG in 2002 as detailed above.  LVEF 40-45% as of 2013.  3. Apparent GI bleed, hemoglobin 4.8 at presentation. Patient has previous history of GI bleeding with documentation of erosive gastritis and duodenitis as of 2002 based on record review.  4. Type 2 diabetes mellitus.  5. Dementia, possibly multi-infarct.  6. Cerebrovascular disease status post strokes.  7. Hypertension.  8. Hyperlipidemia.  9, CKD, likely stage IV.   Recommendations  Agree with holding Aggrenox. No aspirin or anticoagulation at this point either. Would continue beta blocker and hydralazine, can titrate as needed. Will add long-acting nitrate as well. No ACE inhibitor or ARB with renal dysfunction. Would followup on echocardiogram for reassessment of LVEF, expect that it may still be reduced however with history of ischemic cardiomyopathy. She is not a candidate for invasive cardiac evaluation at this time. Would recommend supportive measures. There is no absolute cardiac contraindication for her to be considered for EGD, although general risk will be increased in  light of her comorbidities.   Satira Sark, M.D., F.A.C.C.

## 2014-01-17 NOTE — Consult Note (Signed)
REVIEWED. LOW FAT SOFT MECH DIET. NO EVIDENCE OF ACTIVE BLEEDING. RECENT NSTEMI. CONSIDER BENEFITS V. RISKS OF TCS/EGD. CONSIDER EGD FEB 25 IF PT REMAINS STABLE. IF NO PUD COULD CONSIDER DAILY ASA. AVOID CO-ADMINISTARTION OF ASA AND AGGRENOX UNTIL GI WORK UP COMPLETE.

## 2014-01-18 ENCOUNTER — Encounter (HOSPITAL_COMMUNITY): Payer: Self-pay | Admitting: *Deleted

## 2014-01-18 ENCOUNTER — Encounter (HOSPITAL_COMMUNITY)
Admission: EM | Disposition: A | Discharge status: Home Health | Payer: Self-pay | Source: Home / Self Care | Attending: Internal Medicine

## 2014-01-18 DIAGNOSIS — K922 Gastrointestinal hemorrhage, unspecified: Secondary | ICD-10-CM | POA: Diagnosis not present

## 2014-01-18 DIAGNOSIS — E860 Dehydration: Secondary | ICD-10-CM

## 2014-01-18 DIAGNOSIS — N183 Chronic kidney disease, stage 3 unspecified: Secondary | ICD-10-CM

## 2014-01-18 DIAGNOSIS — N289 Disorder of kidney and ureter, unspecified: Secondary | ICD-10-CM

## 2014-01-18 DIAGNOSIS — I259 Chronic ischemic heart disease, unspecified: Secondary | ICD-10-CM

## 2014-01-18 DIAGNOSIS — D509 Iron deficiency anemia, unspecified: Secondary | ICD-10-CM

## 2014-01-18 DIAGNOSIS — R4182 Altered mental status, unspecified: Secondary | ICD-10-CM | POA: Diagnosis not present

## 2014-01-18 DIAGNOSIS — I059 Rheumatic mitral valve disease, unspecified: Secondary | ICD-10-CM

## 2014-01-18 HISTORY — PX: ESOPHAGOGASTRODUODENOSCOPY: SHX5428

## 2014-01-18 LAB — GLUCOSE, CAPILLARY
GLUCOSE-CAPILLARY: 115 mg/dL — AB (ref 70–99)
GLUCOSE-CAPILLARY: 105 mg/dL — AB (ref 70–99)
GLUCOSE-CAPILLARY: 203 mg/dL — AB (ref 70–99)
Glucose-Capillary: 163 mg/dL — ABNORMAL HIGH (ref 70–99)
Glucose-Capillary: 123 mg/dL — ABNORMAL HIGH (ref 70–99)

## 2014-01-18 LAB — CBC
HCT: 29.9 % — ABNORMAL LOW (ref 36.0–46.0)
HCT: 28.2 % — ABNORMAL LOW (ref 36.0–46.0)
HEMOGLOBIN: 9.6 g/dL — AB (ref 12.0–15.0)
Hemoglobin: 9.3 g/dL — ABNORMAL LOW (ref 12.0–15.0)
MCH: 28.5 pg (ref 26.0–34.0)
MCH: 28.9 pg (ref 26.0–34.0)
MCHC: 32.1 g/dL (ref 30.0–36.0)
MCHC: 33 g/dL (ref 30.0–36.0)
MCV: 88.7 fL (ref 78.0–100.0)
MCV: 87.6 fL (ref 78.0–100.0)
PLATELETS: 267 10*3/uL (ref 150–400)
Platelets: 258 10*3/uL (ref 150–400)
RBC: 3.37 MIL/uL — ABNORMAL LOW (ref 3.87–5.11)
RBC: 3.22 MIL/uL — AB (ref 3.87–5.11)
RDW: 17 % — ABNORMAL HIGH (ref 11.5–15.5)
RDW: 16.8 % — AB (ref 11.5–15.5)
WBC: 5.6 10*3/uL (ref 4.0–10.5)
WBC: 4.8 10*3/uL (ref 4.0–10.5)

## 2014-01-18 LAB — BASIC METABOLIC PANEL
BUN: 43 mg/dL — ABNORMAL HIGH (ref 6–23)
CALCIUM: 8.1 mg/dL — AB (ref 8.4–10.5)
CO2: 16 meq/L — AB (ref 19–32)
Chloride: 116 mEq/L — ABNORMAL HIGH (ref 96–112)
Creatinine, Ser: 2.24 mg/dL — ABNORMAL HIGH (ref 0.50–1.10)
GFR calc Af Amer: 24 mL/min — ABNORMAL LOW (ref >90)
GFR calc non Af Amer: 21 mL/min — ABNORMAL LOW (ref >90)
Glucose, Bld: 171 mg/dL — ABNORMAL HIGH (ref 70–99)
Potassium: 4.1 mEq/L (ref 3.7–5.3)
Sodium: 146 mEq/L (ref 137–147)

## 2014-01-18 LAB — TYPE AND SCREEN
ABO/RH(D): A POS
ANTIBODY SCREEN: NEGATIVE
Unit division: 0
Unit division: 0
Unit division: 0

## 2014-01-18 SURGERY — EGD (ESOPHAGOGASTRODUODENOSCOPY)
Anesthesia: Moderate Sedation

## 2014-01-18 MED ORDER — ONDANSETRON HCL 4 MG/2ML IJ SOLN
INTRAMUSCULAR | Status: AC
  Filled 2014-01-18: qty 2

## 2014-01-18 MED ORDER — SODIUM CHLORIDE 0.9 % IV SOLN
INTRAVENOUS | Status: DC
  Administered 2014-01-18: 1000 mL via INTRAVENOUS

## 2014-01-18 MED ORDER — LIDOCAINE VISCOUS 2 % MT SOLN
OROMUCOSAL | Status: AC
  Filled 2014-01-18: qty 15

## 2014-01-18 MED ORDER — STERILE WATER FOR IRRIGATION IR SOLN
Status: DC | PRN
  Administered 2014-01-18: 16:00:00

## 2014-01-18 MED ORDER — MEPERIDINE HCL 100 MG/ML IJ SOLN
INTRAMUSCULAR | Status: DC | PRN
  Administered 2014-01-18: 50 mg

## 2014-01-18 MED ORDER — MIDAZOLAM HCL 5 MG/5ML IJ SOLN
INTRAMUSCULAR | Status: AC
  Filled 2014-01-18: qty 10

## 2014-01-18 MED ORDER — ONDANSETRON HCL 4 MG/2ML IJ SOLN
INTRAMUSCULAR | Status: DC | PRN
  Administered 2014-01-18: 4 mg via INTRAVENOUS

## 2014-01-18 MED ORDER — SODIUM BICARBONATE 650 MG PO TABS
650.0000 mg | ORAL_TABLET | Freq: Three times a day (TID) | ORAL | Status: DC
  Administered 2014-01-18 – 2014-01-19 (×5): 650 mg via ORAL
  Filled 2014-01-18 (×5): qty 1

## 2014-01-18 MED ORDER — MEPERIDINE HCL 100 MG/ML IJ SOLN
INTRAMUSCULAR | Status: AC
  Filled 2014-01-18: qty 2

## 2014-01-18 MED ORDER — LIDOCAINE VISCOUS 2 % MT SOLN
OROMUCOSAL | Status: DC | PRN
  Administered 2014-01-18: 1 via OROMUCOSAL

## 2014-01-18 NOTE — Progress Notes (Signed)
PROGRESS NOTE    [name] [id number] DOB: 02/26/1941 DOA: 01/16/2014 PCP: [name], MD  HPI/Brief narrative 73 year old female patient with history of GI bleed in 2002 when she was on Coumadin for cerebrovascular disease-found to have erosive gastritis and duodenitis by EGD, multivessel CAD status post CABG 2002, last LVEF 40-45% in March 2013, type II DM, CVA, seizure disorder, iron deficiency anemia, HTN, HLD, stage IV chronic kidney disease, admitted on 01/16/14 with complaints of generalized weakness and found to have hemoglobin of 4.8. She lives with her daughter and ambulates with the help of a walker. No reported chest pain, dyspnea, nausea, vomiting. Daughter did notice some darker than normal stool on 2/22 but no BRBPR. DM in the ED was normal colored and FOBT negative x1, positive x1. She was admitted for worsening anemia, acute on chronic kidney disease.  Assessment/Plan:  1. Acute on chronic iron deficiency anemia:? Slow GI bleed in the context of chronic kidney disease. Patient states that she had a dark stool this morning. Patient transfused a total of 3 unit prbc during this admission.  Plans are for EGD this morning.  GI following. 2. Possible GI bleed: Management as above. Antiplatelet agents held. Add PPI. Discussed with GI. 3. Acute on stage IV chronic kidney disease: Likely secondary to volume depletion. Brief IV fluids. Improving. Follow daily BMP. Hold diuretics temporarily. Creatinine back to baseline in the 2.2 range. 4. NSTEMI, most likely type 2/multivessel CAD/CABG/ischemic cardiomyopathy (LVEF 40-45% in 2013): In the setting of severe symptomatic anemia and acute on chronic renal failure. Denies chest pain and no acute findings an EKG. Peak troponin of 0.74. Cardiology input appreciated. Holding antiplatelets and anticoagulants secondary to GI bleed issues. Continue beta blockers and hydralazine. Cardiology adding long-acting nitrates. No ACE inhibitor so  ARB secondary to renal insufficiency. . Not a candidate for invasive cardiac evaluation at this time. Discussed with cardiology. 5. 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 6. Hypertension: Reasonably controlled. 7. History of CVA: Holding antiplatelets secondary to GI bleed issues and anemia. 8. Dementia: Possibly multi-infarct. 9. Non-anion gap metabolic acidosis: Unclear etiology. Appears to be a chronic finding.  She briefly received bicarbonate infusion.  Will transition back to oral bicarb 10. Seizure disorder: Continue Keppra. 11. History of chronic systolic CHF: Currently clinically dehydrated. Hold Lasix. Brief IV fluids and monitor closely. 12. Dehydration: Brief IV fluids, improved   Code Status: Full Family Communication: None at bedside, attempted to reach 2 daughters by phone and was unable Disposition Plan: Home when medically stable   Consultants:  Cardiology  Gastroenterology  Procedures: Echo: - Left ventricle: The cavity size was normal. Wall thickness was increased in a pattern of moderate LVH. Systolic function was mildly to moderately reduced. The estimated ejection fraction was 40%. Doppler parameters are consistent with abnormal left ventricular relaxation (grade 1 diastolic dysfunction). 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 - Aortic valve: Mildly calcified annulus. Mildly thickened leaflets. - Mitral valve: There is mildly restricted mobility of the posterior leaflet due to scarring of the basal inferolateral wall. Moderately thickened anterior and mildly thickened posterior leaflets. Severe posteriorly directed regurgitation. - Left atrium: The atrium was mildly dilated. -  Right ventricle: The cavity size was mildly to moderately dilated. Systolic function was moderately reduced. -  Right atrium: The atrium was mildly dilated. - Pulmonary arteries: Incomplete spectral Doppler profile to accurately assess pulmonary pressures    Antibiotics:  None   Subjective: Denies complaints. States she had a dark bowel movement yesterday, none today.  Objective: Filed Vitals:   01/18/14 0500 01/18/14 0600 01/18/14 0800 01/18/14 0900  BP: 145/99 143/92 129/72 139/65  Pulse: 83 82    Temp:   98 F (36.7 C)   TempSrc:   Oral   Resp: 19 17 19 18   Height:      Weight: 69 kg (152 lb 1.9 oz)     SpO2: 100% 99%      Intake/Output Summary (Last 24 hours) at 01/18/14 1051 Last data filed at 01/18/14 0600  Gross per 24 hour  Intake   2420 ml  Output    550 ml  Net   1870 ml   Filed Weights   01/16/14 2206 01/17/14 0500 01/18/14 0500  Weight: 66.4 kg (146 lb 6.2 oz) 67.5 kg (148 lb 13 oz) 69 kg (152 lb 1.9 oz)     Exam:  General exam: Chronically ill-appearing elderly female lying comfortably in bed.  Respiratory system: Clear. No increased work of breathing. Cardiovascular system: S1 & S2 heard, RRR. No JVD, murmurs, gallops, clicks or pedal edema.  Gastrointestinal system: Abdomen is nondistended, soft and nontender. Normal bowel sounds heard. Central nervous system: Alert and oriented x2. No focal neurological deficits. Extremities: Symmetric 5 x 5 power.   Data Reviewed: Basic Metabolic Panel:  Recent Labs Lab 01/16/14 1706 01/17/14 0612 01/18/14 0426  NA 141 144 146  K 4.6 5.1 4.1  CL 111 117* 116*  CO2 13* 13* 16*  GLUCOSE 135* 145* 171*  BUN 64* 57* 43*  CREATININE 3.02* 2.73* 2.24*  CALCIUM 8.7 8.4 8.1*  MG 2.5  --   --    Liver Function Tests:  Recent Labs Lab 01/16/14 1706  AST 16  ALT 8  ALKPHOS 41  BILITOT 0.2*  PROT 8.7*  ALBUMIN 3.3*   No results found for this basename: LIPASE, AMYLASE,  in the last 168 hours No  results found for this basename: AMMONIA,  in the last 168 hours CBC:  Recent Labs Lab 01/16/14 1706  01/17/14 0612 01/17/14 1141 01/17/14 1949 01/17/14 2300 01/18/14 0426  WBC 6.7  --  5.2  --  4.2 5.6 4.8  NEUTROABS 5.4  --   --   --   --   --   --   HGB 4.8*  < > 8.0* 7.7* 9.1* 9.6* 9.3*  HCT 15.9*  < > 25.1* 24.1* 27.9* 29.9* 28.2*  MCV 90.9  --  88.4  --  87.2 88.7 87.6  PLT 334  --  282  --  275 258 267  < > = values in this interval not displayed. Cardiac Enzymes:  Recent Labs Lab 01/16/14 1706 01/17/14 0005 01/17/14 0612 01/17/14 1141  TROPONINI 0.42* 0.39* 0.74* 0.76*   BNP (last 3 results)  Recent Labs  07/22/13 1420  PROBNP 27695.0*   CBG:  Recent Labs Lab 01/17/14 1647 01/17/14 2129 01/18/14 0743  GLUCAP 161* 167* 163*    Recent Results (from the past 240 hour(s))  MRSA PCR SCREENING     Status: None   Collection Time    01/16/14 10:30 PM      Result Value Ref Range Status   MRSA by PCR NEGATIVE  NEGATIVE Final   Comment:  The GeneXpert MRSA Assay (FDA     approved for NASAL specimens     only), is one component of a     comprehensive MRSA colonization     surveillance program. It is not     intended to diagnose MRSA     infection nor to guide or     monitor treatment for     MRSA infections.    Studies: Ct Head Wo Contrast  01/16/2014   CLINICAL DATA:  Altered mental status.  EXAM: CT HEAD WITHOUT CONTRAST  TECHNIQUE: Contiguous axial images were obtained from the base of the skull through the vertex without intravenous contrast.  COMPARISON:  Head CT scan 09/03/2012.  FINDINGS: The brain is atrophic with chronic microvascular ischemic change. 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 Remote basal ganglia lacunar infarctions are noted. No evidence of acute abnormality including infarct, hemorrhage, mass lesion, mass effect, midline shift or abnormal extra-axial fluid collection. No hydrocephalus or pneumocephalus. The  calvarium is intact.  IMPRESSION: No acute finding.  Stable compared to prior exam.   Electronically Signed   By: Inge Rise M.D.   On: 01/16/2014 16:26        Scheduled Meds: . donepezil  5 mg Oral Daily  . hydrALAZINE  50 mg Oral 3 times per day  . insulin aspart  0-9 Units Subcutaneous TID WC  . isosorbide mononitrate  15 mg Oral Daily  . levETIRAcetam  500 mg Oral BID  . metoprolol succinate  50 mg Oral Daily  . pantoprazole  40 mg Oral Daily  . sodium bicarbonate  650 mg Oral TID   Continuous Infusions:   Principal Problem:   GI bleeding Active Problems:   Diabetes mellitus   Seizure disorder   CVA (cerebral infarction)   CKD (chronic kidney disease) stage 3, GFR 30-59 ml/min   Acute on chronic renal failure   Weakness generalized   Anemia associated with acute blood loss   NSTEMI (non-ST elevated myocardial infarction)   Systolic CHF, chronic last EF 40% 3/13   Iron deficiency anemia    Time spent: 25 minutes    [name],[name], MD. [facility] Pager [phone]  If 7PM-7AM, please contact night-coverage [email or web address] Password [facility] 01/18/2014, 10:51 AM    LOS: 2 days

## 2014-01-18 NOTE — Progress Notes (Signed)
The patient was seen and examined, and I agree with the assessment and plan as documented above, with modifications as noted below. Pt denies chest pain, palpitations, leg swelling, and shortness of breath. Troponins peaked at 0.76, likely secondary to severe anemia and consequent demand ischemia. Most recent Hgb 9.3. EGD planned. Echo (personally reviewed) showed EF 40% with wall motion abnormalities as noted above, moderate LVH, grade I diastolic dysfunction, and posterior mitral leaflet restricted mobility with resulting severe mitral regurgitation. Continue present medical management, monitoring for evidence of heart failure and atrial arrhythmias given moderately reduced LV systolic function and severe MR.

## 2014-01-18 NOTE — Progress Notes (Signed)
Pt a/o.vss. Saline lock intact. No complaints of any distress. Report called to Olean Ree, RN. Pt to be transferred via wheelchair to room 326.

## 2014-01-18 NOTE — Op Note (Signed)
[facility] [address] [place], [address]   ENDOSCOPY PROCEDURE REPORT  PATIENT: Kimberly, [name]  MR#: [id number] BIRTHDATE: [birth year] , 73  yrs. old GENDER: Female ENDOSCOPIST: R.  Garfield Cornea, MD FACP [facility] REFERRED BY:  Iona Beard, M.D. PROCEDURE DATE:  01/18/2014 PROCEDURE:     EGD with gastric biopsy  INDICATIONS:    Profound iron deficiency anemia  INFORMED CONSENT:   The risks, benefits, limitations, alternatives and imponderables have been discussed.  The potential for biopsy, esophogeal dilation, etc. have also been rev  Questions have been answered.  All parties agreeable.  Please see the history and physical in the medical record for more information.  MEDICATIONS:  Versed 2 mg IV and Demerol 50 mg IV in divided doses. Zofran 4 mg IV;  Xylocaine gel orally  DESCRIPTION OF PROCEDURE:   The [id number]-[id number] ([id number])  endoscope was introduced through the mouth and advanced to the second portion of the duodenum without difficulty or limitations.  The mucosal surfaces were surveyed very carefully during advancement of the scope and upon withdrawal.  Retroflexion view of the proximal stomach and esophagogastric junction was performed.      FINDINGS: Normal esophagus. Stomach empty. Diffuse fine submucosal petechiae. Small hiatal hernia. No ulcer or infiltrating process. Patent pylorus. Tiny erosions of the duodenal bulb otherwise the first and second portion of the duodenum appeared normal.  THERAPEUTIC / DIAGNOSTIC MANEUVERS PERFORMED:  Biopsies abnormal gastric mucosa taken for histologic study   COMPLICATIONS:  None  IMPRESSION:    Small hiatal hernia. Abnormal gastric and duodenal bulbar mucosa of uncertain significance - status post gastric biopsy  RECOMMENDATIONS:  Continue Protonix 40 mg daily. Followup on pathology. Advance diet. Outpatient colonoscopy at a later date.    _______________________________ R. Garfield Cornea, MD FACP  [facility] eSigned:  R. Garfield Cornea, MD FACP [facility] 01/18/2014 3:54 PM     CC:  PATIENT NAME:  Kimberly, [name] MR#: [id number]

## 2014-01-18 NOTE — Progress Notes (Signed)
Subjective:  No cardiac complaints  Objective:  Vital Signs in the last 24 hours: Temp:  [97.8 F (36.6 C)-99 F (37.2 C)] 98.6 F (37 C) (02/25 0400) Pulse Rate:  [40-89] 82 (02/25 0600) Resp:  [15-25] 17 (02/25 0600) BP: (106-159)/(50-99) 143/92 mmHg (02/25 0600) SpO2:  [91 %-100 %] 99 % (02/25 0600) Weight:  [152 lb 1.9 oz (69 kg)] 152 lb 1.9 oz (69 kg) (02/25 0500)  Intake/Output from previous day: 02/24 0701 - 02/25 0700 In: 2570 [P.O.:1200; I.V.:1050; Blood:320] Out: 550 [Urine:550] Intake/Output from this shift:    Physical Exam: NECK: Without JVD, HJR, or bruit LUNGS: Clear anterior, posterior, lateral HEART: Regular rate and rhythm, no murmur, gallop, rub, bruit, thrill, or heave EXTREMITIES: Without cyanosis, clubbing, or edema  Lab Results:  Recent Labs  01/17/14 2300 01/18/14 0426  WBC 5.6 4.8  HGB 9.6* 9.3*  PLT 258 267    Recent Labs  01/17/14 0612 01/18/14 0426  NA 144 146  K 5.1 4.1  CL 117* 116*  CO2 13* 16*  GLUCOSE 145* 171*  BUN 57* 43*  CREATININE 2.73* 2.24*    Recent Labs  01/17/14 0612 01/17/14 1141  TROPONINI 0.74* 0.76*   Hepatic Function Panel  Recent Labs  01/16/14 1706  PROT 8.7*  ALBUMIN 3.3*  AST 16  ALT 8  ALKPHOS 41  BILITOT 0.2*   No results found for this basename: CHOL,  in the last 72 hours No results found for this basename: PROTIME,  in the last 72 hours  Imaging: 2Decho 01/17/14: Study Conclusions  - Left ventricle: The cavity size was normal. Wall thickness   was increased in a pattern of moderate LVH. Systolic   function was mildly to moderately reduced. The estimated   ejection fraction was 40%. Doppler parameters are   consistent with abnormal left ventricular relaxation   (grade 1 diastolic dysfunction). - Regional wall motion abnormality: Akinesis and aneurysm of   the basal inferior myocardium; akinesis and scarring of   the basal inferolateral myocardium; akinesis of the mid  inferolateral myocardium; severe hypokinesis of the mid   inferior myocardium; moderate hypokinesis of the basal   inferoseptal myocardium. - Aortic valve: Mildly calcified annulus. Mildly thickened   leaflets. - Mitral valve: There is mildly restricted mobility of the   posterior leaflet due to scarring of the basal   inferolateral wall. Moderately thickened anterior and   mildly thickened posterior leaflets. Severe posteriorly   directed regurgitation. - Left atrium: The atrium was mildly dilated. - Right ventricle: The cavity size was mildly to moderately   dilated. Systolic function was moderately reduced. - Right atrium: The atrium was mildly dilated. - Pulmonary arteries: Incomplete spectral Doppler profile to   accurately assess pulmonary pressures.  2Decho 2013: Study Conclusions  - Left ventricle: Systolic function was mildly to moderately   reduced. The estimated ejection fraction was 40%, in the   range of 40% to 45%. Basal inferoposterior   akinesis/aneurysm. - Mitral valve: Mobility of the posterior leaflet was mildly   restricted. Moderate regurgitation. - Left atrium: The atrium was moderately dilated. No   evidence of thrombus in the atrial cavity or appendage. - Atrial septum: No defect or patent foramen ovale was   identified. Impressions:  - Negative saline microcavitation study     Assessment/Plan:  1. NSTEMI, most likely type II event in the setting of severe anemia and physiologic stress. She denies any active chest pain. ECG does show ischemic changes with  increased heart rate.Continue beta blocker and hydralazine.Nitrates added. 2Decho above EF 40%.  2. Known history of multivessel CAD status post CABG in 2002 as detailed above. LVEF 40-45% as of 2013.  3. GI bleed, hemoglobin 4.8 at presentation. Patient has previous history of GI bleeding with documentation of erosive gastritis and duodenitis as of 2002 based on record review.  4. Type 2 diabetes  mellitus.  5. Dementia, possibly multi-infarct.  6. Cerebrovascular disease status post strokes.  7. Hypertension.  8. Hyperlipidemia.  9, CKD, likely stage IV. No ACEI or ARB.       LOS: 2 days    [name] 01/18/2014, 8:36 AM

## 2014-01-18 NOTE — Progress Notes (Signed)
    Subjective: Patient pleasantly confused. Denies any abdominal pain, N/V. NPO since midnight for possible procedure today.   Objective: Vital signs in last 24 hours: Temp:  [97.8 F (36.6 C)-99 F (37.2 C)] 98.6 F (37 C) (02/25 0400) Pulse Rate:  [40-89] 82 (02/25 0600) Resp:  [15-25] 17 (02/25 0600) BP: (106-159)/(49-99) 143/92 mmHg (02/25 0600) SpO2:  [91 %-100 %] 99 % (02/25 0600) Weight:  [152 lb 1.9 oz (69 kg)] 152 lb 1.9 oz (69 kg) (02/25 0500) Last BM Date: 01/17/14 General:   Alert and oriented to person only.  Head:  Normocephalic and atraumatic. Heart:  S1, S2 present, no murmurs noted.  Lungs: Scattered rhonchi bilaterally Abdomen:  Bowel sounds present, soft, non-tender, non-distended. No HSM or hernias noted. No rebound or guarding. No masses appreciated  Msk:  Symmetrical without gross deformities. Normal posture. Extremities:  Without clubbing or edema. Neurologic:  Alert and  oriented to person and place only.  Psych:  Alert and cooperative. Normal mood and affect.  Intake/Output from previous day: 02/24 0701 - 02/25 0700 In: 2570 [P.O.:1200; I.V.:1050; Blood:320] Out: 550 [Urine:550] Intake/Output this shift:    Lab Results:  Recent Labs  01/17/14 1949 01/17/14 2300 01/18/14 0426  WBC 4.2 5.6 4.8  HGB 9.1* 9.6* 9.3*  HCT 27.9* 29.9* 28.2*  PLT 275 258 267   BMET  Recent Labs  01/16/14 1706 01/17/14 0612 01/18/14 0426  NA 141 144 146  K 4.6 5.1 4.1  CL 111 117* 116*  CO2 13* 13* 16*  GLUCOSE 135* 145* 171*  BUN 64* 57* 43*  CREATININE 3.02* 2.73* 2.24*  CALCIUM 8.7 8.4 8.1*   LFT  Recent Labs  01/16/14 1706  PROT 8.7*  ALBUMIN 3.3*  AST 16  ALT 8  ALKPHOS 41  BILITOT 0.2*   PT/INR  Recent Labs  01/16/14 1706  LABPROT 15.1  INR 1.22    Studies/Results: Ct Head Wo Contrast  01/16/2014   CLINICAL DATA:  Altered mental status.  EXAM: CT HEAD WITHOUT CONTRAST  TECHNIQUE: Contiguous axial images were obtained from the  base of the skull through the vertex without intravenous contrast.  COMPARISON:  Head CT scan 09/03/2012.  FINDINGS: The brain is atrophic with chronic microvascular ischemic change. 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 Remote basal ganglia lacunar infarctions are noted. No evidence of acute abnormality including infarct, hemorrhage, mass lesion, mass effect, midline shift or abnormal extra-axial fluid collection. No hydrocephalus or pneumocephalus. The calvarium is intact.  IMPRESSION: No acute finding.  Stable compared to prior exam.   Electronically Signed   By: Inge Rise M.D.   On: 01/16/2014 16:26    Assessment: 73 year old female admitted with profound, acute on chronic anemia but heme negative stool. No evidence of overt GI bleeding. Improvement in Hgb since admission after 3 units PRBCs. Bump in troponins noted due to demand ischemia, and she has been cleared by cardiology for an EGD.   Last EGD and colonoscopy in remote past (2002), with possible history of GI bleed at that time. In the presence of Aggrenox, concern for occult upper GI etiology/small bowel etiology. Elective colonoscopy could be performed at a later date.   Plan: Remain NPO EGD with Dr. Gala Romney today; will need to obtain consent from daughter due to cognitive status Protonix daily Elective outpatient colonoscopy   Orvil Feil, ANP-BC [facility]    LOS: 2 days    01/18/2014, 7:48 AM

## 2014-01-19 ENCOUNTER — Inpatient Hospital Stay (HOSPITAL_COMMUNITY): Payer: Medicare Other

## 2014-01-19 ENCOUNTER — Telehealth: Payer: Self-pay | Admitting: Gastroenterology

## 2014-01-19 DIAGNOSIS — D649 Anemia, unspecified: Secondary | ICD-10-CM

## 2014-01-19 DIAGNOSIS — I25810 Atherosclerosis of coronary artery bypass graft(s) without angina pectoris: Secondary | ICD-10-CM

## 2014-01-19 LAB — CBC
HCT: 29.3 % — ABNORMAL LOW (ref 36.0–46.0)
HEMOGLOBIN: 9.3 g/dL — AB (ref 12.0–15.0)
MCH: 28.4 pg (ref 26.0–34.0)
MCHC: 31.7 g/dL (ref 30.0–36.0)
MCV: 89.6 fL (ref 78.0–100.0)
Platelets: 272 10*3/uL (ref 150–400)
RBC: 3.27 MIL/uL — AB (ref 3.87–5.11)
RDW: 17.4 % — ABNORMAL HIGH (ref 11.5–15.5)
WBC: 5.5 10*3/uL (ref 4.0–10.5)

## 2014-01-19 LAB — GLUCOSE, CAPILLARY
GLUCOSE-CAPILLARY: 146 mg/dL — AB (ref 70–99)
GLUCOSE-CAPILLARY: 204 mg/dL — AB (ref 70–99)
Glucose-Capillary: 147 mg/dL — ABNORMAL HIGH (ref 70–99)

## 2014-01-19 MED ORDER — GLIPIZIDE 5 MG PO TABS: 2.5000 mg | ORAL_TABLET | Freq: Every day | ORAL | Status: DC

## 2014-01-19 MED ORDER — PANTOPRAZOLE SODIUM 40 MG PO TBEC: 40.0000 mg | DELAYED_RELEASE_TABLET | Freq: Every day | ORAL | Status: DC

## 2014-01-19 MED ORDER — ISOSORBIDE MONONITRATE 15 MG HALF TABLET: 15.0000 mg | ORAL_TABLET | Freq: Every day | ORAL | Status: DC

## 2014-01-19 NOTE — Progress Notes (Signed)
Discharged home with instructions given on medications,and follow up visits,patient and daughter verbalized understanding. Prescriptions sent with patient. [facility] to follow up with patient, I spoke with [name] who is the customer contact person for [facility].No c/o pain or discomfort noted. Accompanied by staff to an awaiting vehicle.

## 2014-01-19 NOTE — Progress Notes (Signed)
Subjective:  Pleasant but confused. She denies abd pain. Tolerating diet.    Objective: Vital signs in last 24 hours: Temp:  [97.2 F (36.2 C)-98.8 F (37.1 C)] 98.8 F (37.1 C) (02/26 0448) Pulse Rate:  [44-90] 90 (02/26 1015) Resp:  [13-28] 20 (02/26 0448) BP: (105-161)/(49-95) 144/77 mmHg (02/26 1015) SpO2:  [94 %-100 %] 94 % (02/26 0448) Weight:  [152 lb (68.947 kg)] 152 lb (68.947 kg) (02/25 1330) Last BM Date: 01/17/14 General:   Alert,  Well-developed, well-nourished, pleasant and cooperative in NAD Head:  Normocephalic and atraumatic. Eyes:  Sclera clear, no icterus.   Abdomen:  Soft, nontender and nondistended.   Normal bowel sounds, without guarding, and without rebound.   Extremities:  Without clubbing, deformity or edema. Neurologic:  Alert and  oriented x4;  grossly normal neurologically. Skin:  Intact without significant lesions or rashes. Psych:  Alert and cooperative. Normal mood and affect.  Intake/Output from previous day:   Intake/Output this shift: Total I/O In: 240 [P.O.:240] Out: -   Lab Results: CBC  Recent Labs  01/17/14 2300 01/18/14 0426 01/19/14 0558  WBC 5.6 4.8 5.5  HGB 9.6* 9.3* 9.3*  HCT 29.9* 28.2* 29.3*  MCV 88.7 87.6 89.6  PLT 258 267 272   BMET  Recent Labs  01/16/14 1706 01/17/14 0612 01/18/14 0426  NA 141 144 146  K 4.6 5.1 4.1  CL 111 117* 116*  CO2 13* 13* 16*  GLUCOSE 135* 145* 171*  BUN 64* 57* 43*  CREATININE 3.02* 2.73* 2.24*  CALCIUM 8.7 8.4 8.1*   LFTs  Recent Labs  01/16/14 1706  BILITOT 0.2*  ALKPHOS 41  AST 16  ALT 8  PROT 8.7*  ALBUMIN 3.3*   No results found for this basename: LIPASE,  in the last 72 hours PT/INR  Recent Labs  01/16/14 1706  LABPROT 15.1  INR 1.22      Imaging Studies: Ct Head Wo Contrast  01/16/2014   CLINICAL DATA:  Altered mental status.  EXAM: CT HEAD WITHOUT CONTRAST  TECHNIQUE: Contiguous axial images were obtained from the base of the skull through the vertex  without intravenous contrast.  COMPARISON:  Head CT scan 09/03/2012.  FINDINGS: The brain is atrophic with chronic microvascular ischemic change. 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 Remote basal ganglia lacunar infarctions are noted. No evidence of acute abnormality including infarct, hemorrhage, mass lesion, mass effect, midline shift or abnormal extra-axial fluid collection. No hydrocephalus or pneumocephalus. The calvarium is intact.  IMPRESSION: No acute finding.  Stable compared to prior exam.   Electronically Signed   By: Inge Rise M.D.   On: 01/16/2014 16:26  [2 weeks]   Assessment: 73 year old female admitted with profound, acute on chronic anemia but heme negative stool. No evidence of overt GI bleeding. Improvement in Hgb since admission after 3 units PRBCs. Bump in troponins noted due to demand ischemia.  Last colonoscopy in remote past (2002), with possible history of GI bleed at that time.    EGD yesterday showed small hh, abnormal gastric and duodenal bulbar mucosa of unclear significance. Biopsies pending.   Hgb stable.  Plan: 1. F/u path as available. 2. Continue protonix daily. 3. Outpatient colonoscopy at later date. We will make arrangements. 4. Monitor H/H. 5. Will sign off for now. Call with any questions.    LOS: 3 days   Neil Crouch  01/19/2014, 11:16 AM

## 2014-01-19 NOTE — Telephone Encounter (Signed)
Patient needs E30 hospital follow up in 4 weeks to consider colonoscopy for anemia. Recent NSTEMI. Needs family member to come with her due to her dementia.

## 2014-01-19 NOTE — Care Management Note (Signed)
    Page 1 of 1   01/19/2014     1:36:37 PM   CARE MANAGEMENT NOTE 01/19/2014  Patient:  [name], [name]   Account Number:  [id number]  Date Initiated:  01/19/2014  Documentation initiated by:  Theophilus Kinds  Subjective/Objective Assessment:   Pt admitted from home with gi bleeding. Pt lives with her daughter [name] and will return home at discharge. Pt has a walker and BSC for home use. Pt has a CaP aide2-3 hours a day 5 days a week.     Action/Plan:   PT consult pending. Will continue to monitor for discharge planning needs.   Anticipated DC Date:  01/21/2014   Anticipated DC Plan:  [place]  CM consult      Choice offered to / List presented to:             Status of service:  Completed, signed off Medicare Important Message given?   (If response is "NO", the following Medicare IM given date fields will be blank) Date Medicare IM given:   Date Additional Medicare IM given:    Discharge Disposition:  HOME/SELF CARE  Per UR Regulation:    If discussed at Long Length of Stay Meetings, dates discussed:    Comments:  01/19/14 [place], RN BSN CM

## 2014-01-19 NOTE — Evaluation (Signed)
Physical Therapy Evaluation Patient Details Name: [name] MRN: [id number] DOB: [birth year] Today's Date: 01/19/2014 Time: 5170-0174 PT Time Calculation (min): 33 min  PT Assessment / Plan / Recommendation History of Present Illness  73 yo female h/o cva, fe def anemia, htn, dm, sz disorder comes in with daughter for more weakness than usual.  Usually walks with walker with assistance.  Lives with dtr  Clinical Impression  Ms. Vowell demonstrated the ability to ambulate 125 ft and go up six steps with a rolling walker.   She does tend to drift to the left with the walker and needs constant verbal cuing to go straight.  Pt will benefit from a short duration of HH to make sure pt home environment is as safe as possible.    PT Assessment  Patient needs continued PT services    Follow Up Recommendations  Home health PT    Does the patient have the potential to tolerate intense rehabilitation    no  Barriers to Discharge  none      Equipment Recommendations  None recommended by PT    Recommendations for Other Services   none  Frequency Min 2X/week    Precautions / Restrictions Precautions Precautions: None Restrictions Weight Bearing Restrictions: No   Pertinent Vitals/Pain None noted      Mobility  Bed Mobility Overal bed mobility: Modified Independent Transfers Overall transfer level: Modified independent Equipment used: Rolling walker (2 wheeled) Ambulation/Gait Ambulation/Gait assistance: Modified independent (Device/Increase time) Ambulation Distance (Feet): 120 Feet Assistive device: Rolling walker (2 wheeled) Gait Pattern/deviations: Decreased step length - right;Decreased step length - left Gait velocity interpretation: Below normal speed for age/gender Stairs: Yes Stairs assistance: Supervision Stair Management: Two rails Number of Stairs: 6    Exercises General Exercises - Lower Extremity Heel Slides: 5 reps Hip ABduction/ADduction: 5  reps Straight Leg Raises: 5 reps   PT Diagnosis: Generalized weakness  PT Problem List: Decreased strength;Decreased activity tolerance PT Treatment Interventions: Gait training;Stair training;Therapeutic exercise     PT Goals(Current goals can be found in the care plan section) Acute Rehab PT Goals Patient Stated Goal: to go home Time For Goal Achievement: 01/20/14 Potential to Achieve Goals: Good  Visit Information  Last PT Received On: 01/19/14 History of Present Illness: 73 yo female h/o cva, fe def anemia, htn, dm, sz disorder comes in with daughter for more weakness than usual.  Usually walks with walker with assistance.  Lives with dtr       Prior [place] expects to be discharged to:: Private residence Living Arrangements: Children Available Help at Discharge: Family;Personal care attendant Type of Home: Apartment Home Access: Stairs to enter Technical brewer of Steps: 10 Entrance Stairs-Rails: Can reach both;Right;Left Home Layout: One level Home Equipment: Environmental consultant - 2 wheels Prior Function Level of Independence: Needs assistance Gait / Transfers Assistance Needed: SBA with walking with walker ADL's / Homemaking Assistance Needed: aide completes Communication Communication: No difficulties    Cognition  Cognition Arousal/Alertness: Awake/alert Overall Cognitive Status: Within Functional Limits for tasks assessed    Extremity/Trunk Assessment Lower Extremity Assessment Lower Extremity Assessment: Overall WFL for tasks assessed (Pt has residual weakness from CVA)   Balance    End of Session PT - End of Session Equipment Utilized During Treatment: Gait belt Activity Tolerance: Patient tolerated treatment well Patient left: in chair;with call bell/phone within reach;with chair alarm set;with family/visitor present  GP     RUSSELL,CINDY 01/19/2014, 2:18 PM

## 2014-01-19 NOTE — Discharge Summary (Signed)
Physician Discharge Summary  [name] ATF:[id number] DOB: [birth year] DOA: 01/16/2014  PCP: [name], MD  Admit date: 01/16/2014 Discharge date: 01/19/2014  Time spent: 40 minutes  Recommendations for Outpatient Follow-up:  1. Followup with primary care physician one week 2. Repeat CBC in one week 3. Followup with cardiology in one week, if hemoglobin is stable can consider resuming antiplatelet treatment.  Discharge Diagnoses:  Principal Problem:   GI bleeding Active Problems:   Diabetes mellitus   Seizure disorder   CVA (cerebral infarction)   CKD (chronic kidney disease) stage 3, GFR 30-59 ml/min   Acute on chronic renal failure   Weakness generalized   Anemia associated with acute blood loss   NSTEMI (non-ST elevated myocardial infarction)   Systolic CHF, chronic last EF 40% 3/13   Iron deficiency anemia   Discharge Condition: improved  Diet recommendation: low salt, low carb  Filed Weights   01/17/14 0500 01/18/14 0500 01/18/14 1330  Weight: 67.5 kg (148 lb 13 oz) 69 kg (152 lb 1.9 oz) 68.947 kg (152 lb)    History of present illness:  73 yo female h/o cva, fe def anemia, htn, dm, sz disorder comes in with daughter for more weakness than usual. Usually walks with walker with assistance. Lives with dtr. No fevers. No n/v. No cp. No sob. No swelling. No abd pain. dtr did note some darker than usual stool yesterday, but normal today. No brbpr. Her last iron infusion for her anemia was in October. No syncope or loc. dtr noticed just very weak today. Just had bm which was normal caliber and color, viewed by myself. Pt denies any pain anywhere   Hospital Course:   this patient was admitted to the hospital with complaints of generalized weakness and found to have a hemoglobin of 4.8. She does have chronic kidney disease, coronary artery disease, systolic dysfunction. She was found to be Hemoccult positive. She was admitted to the step down unit and was transfused a  total of 3 units of PRBCs. She was seen by gastroenterology who performed an endoscopy on the patient. Results are listed below. She has not had any further signs of bleeding her hemoglobin has since been stable. She will followup with gastroenterology to pursue an outpatient colonoscopy. She will need to continue on proton pump inhibitor therapy.  The patient was also noted to have an elevation in her troponin consistent with a non-ST elevation MI. This was felt to be related to demand ischemia from severe anemia. The patient does have a history of coronary disease and was taking antiplatelet agents prior to admission. These have since been held due to GI bleeding. I discussed the case with cardiology and they have recommended that patient followup in one week with repeat CBC. If she's not had any further episodes of bleeding and hemoglobin is stable, they will restart antiplatelet therapy in one week.  Creatinine currently appears to be at baseline her metabolic acidosis is due to her renal disease and is chronic. She is continued on oral bicarbonate therapy. She's not on any ARB or ACE inhibitor. She can follow up with her primary care physician/nephrologist for further management of her renal disease.  The patient was noted to have some mild crackles on chest exam. She received significant IV fluids since on admission she was noted to be dehydrated. She'll be restarted on her oral dose of Lasix on discharge. She does not have any shortness of breath.  Procedures: Echo:- Left ventricle: The cavity size  was normal. Wall thickness was increased in a pattern of moderate LVH. Systolic function was mildly to moderately reduced. The estimated ejection fraction was 40%. Doppler parameters are consistent with abnormal left ventricular relaxation (grade 1 diastolic dysfunction). 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 - Aortic valve: Mildly calcified annulus. Mildly thickened leaflets. - Mitral valve: There is mildly restricted mobility of the posterior leaflet due to scarring of the basal inferolateral wall. Moderately thickened anterior and mildly thickened posterior leaflets. Severe posteriorly directed regurgitation. - Left atrium: The atrium was mildly dilated. - Right ventricle: The cavity size was mildly to moderately dilated. Systolic function was moderately reduced. - Right atrium: The atrium was mildly dilated. - Pulmonary arteries: Incomplete spectral Doppler profile to accurately assess pulmonary pressures.  EGD 01/18/14: Small hiatal hernia. Abnormal gastric and duodenal  bulbar mucosa of uncertain significance - status post gastric  biopsy     Consultations:  Cardiology  Gastroenterology  Discharge Exam: Filed Vitals:   01/19/14 1446  BP: 136/82  Pulse: 88  Temp: 98.6 F (37 C)  Resp: 20    General: NAD Cardiovascular: s1 s2 rrr Respiratory: mild crackles at bases  Discharge Instructions  Discharge Orders   Future Orders Complete By Expires   Call MD for:  extreme fatigue  As directed    Call MD for:  persistant dizziness or light-headedness  As directed    Diet - low sodium heart healthy  As directed    Face-to-face encounter (required for Medicare/Medicaid patients)  As directed    Comments:     I Audreanna Torrisi certify that this patient is under my care and that I, or a nurse practitioner or physician's assistant working with me, had a face-to-face encounter that meets the physician face-to-face encounter requirements with this patient on 01/19/2014. The encounter with the patient was in whole, or in part for the following medical condition(s) which is the primary reason for home  health care (List medical condition): was admitted with severe anemia due to blood loss, gi bleeding, acute no chronic renal failure and generalized deconditioning.  Will benefit from home health RN and PT   Questions:     The encounter with the patient was in whole, or in part, for the following medical condition, which is the primary reason for home health care:  gi bleeding, anemia   I certify that, based on my findings, the following services are medically necessary home health services:  Nursing   Physical therapy   My clinical findings support the need for the above services:  Unsafe ambulation due to balance issues   Further, I certify that my clinical findings support that this patient is homebound due to:  Unsafe ambulation due to balance issues   Reason for Medically Necessary Home Health Services:  Skilled Nursing- Teaching of Disease Process/Symptom Management   Home Health  As directed    Questions:     To provide the following care/treatments:  PT   RN   Increase activity slowly  As directed        Medication List    STOP taking these medications       aspirin 325 MG tablet     dipyridamole-aspirin 200-25 MG per 12 hr capsule  Commonly known as:  AGGRENOX  glyBURIDE 2.5 MG tablet  Commonly known as:  DIABETA     metFORMIN 500 MG tablet  Commonly known as:  GLUCOPHAGE      TAKE these medications       donepezil 5 MG tablet  Commonly known as:  ARICEPT  Take 5 mg by mouth daily.     folic acid 1 MG tablet  Commonly known as:  FOLVITE  Take 1 mg by mouth daily.     furosemide 20 MG tablet  Commonly known as:  LASIX  Take 1 tablet (20 mg total) by mouth daily.     glipiZIDE 5 MG tablet  Commonly known as:  GLUCOTROL  Take 0.5 tablets (2.5 mg total) by mouth daily before breakfast.     hydrALAZINE 50 MG tablet  Commonly known as:  APRESOLINE  Take 1 tablet (50 mg total) by mouth every 8 (eight) hours.     isosorbide mononitrate 15 mg Tb24 24 hr tablet   Commonly known as:  IMDUR  Take 0.5 tablets (15 mg total) by mouth daily.     levETIRAcetam 100 MG/ML solution  Commonly known as:  KEPPRA  Take 5 mLs by mouth 2 (two) times daily.     metoprolol succinate 100 MG 24 hr tablet  Commonly known as:  TOPROL-XL  Take 50 mg by mouth daily.     pantoprazole 40 MG tablet  Commonly known as:  PROTONIX  Take 1 tablet (40 mg total) by mouth daily.     potassium chloride 10 MEQ tablet  Commonly known as:  K-DUR  Take 1 tablet (10 mEq total) by mouth daily.     pravastatin 20 MG tablet  Commonly known as:  PRAVACHOL  Take 20 mg by mouth daily.     sodium bicarbonate 650 MG tablet  Take 650 mg by mouth 3 (three) times daily.     Vitamin D (Ergocalciferol) 50000 UNITS Caps capsule  Commonly known as:  DRISDOL  Take 50,000 Units by mouth every 7 (seven) days. Takes on Tuesday       No Known Allergies     Follow-up Information   Follow up with [facility].   Contact information:   [address] [place] [address] [phone]       Follow up with [name], MD.   Specialty:  Family Medicine   Contact information:   [place] [address] [phone]        The results of significant diagnostics from this hospitalization (including imaging, microbiology, ancillary and laboratory) are listed below for reference.    Significant Diagnostic Studies: Ct Head Wo Contrast  01/16/2014   CLINICAL DATA:  Altered mental status.  EXAM: CT HEAD WITHOUT CONTRAST  TECHNIQUE: Contiguous axial images were obtained from the base of the skull through the vertex without intravenous contrast.  COMPARISON:  Head CT scan 09/03/2012.  FINDINGS: The brain is atrophic with chronic microvascular ischemic change. 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 Remote basal ganglia lacunar infarctions are noted. No evidence of acute abnormality including infarct, hemorrhage, mass lesion, mass  effect, midline shift or abnormal extra-axial fluid collection. No hydrocephalus or pneumocephalus. The calvarium is intact.  IMPRESSION: No acute finding.  Stable compared to prior exam.   Electronically Signed   By: Inge Rise M.D.   On: 01/16/2014 16:26   Dg Chest Port 1 View  01/19/2014   CLINICAL DATA:  Is  EXAM: PORTABLE CHEST -  1 VIEW  COMPARISON:  DG CHEST 2 VIEW dated 07/22/2013  FINDINGS: Prior CABG. Mild cardiomegaly with pulmonary vascular prominence and mild interstitial prominence suggesting congestive heart failure with mild interstitial edema. No focal alveolar infiltrate. No pleural effusion or pneumothorax.  IMPRESSION: Findings consistent with mild congestive heart failure.  Prior CABG.   Electronically Signed   By: Marcello Moores  Register   On: 01/19/2014 14:44    Microbiology: Recent Results (from the past 240 hour(s))  MRSA PCR SCREENING     Status: None   Collection Time    01/16/14 10:30 PM      Result Value Ref Range Status   MRSA by PCR NEGATIVE  NEGATIVE Final   Comment:            The GeneXpert MRSA Assay (FDA     approved for NASAL specimens     only), is one component of a     comprehensive MRSA colonization     surveillance program. It is not     intended to diagnose MRSA     infection nor to guide or     monitor treatment for     MRSA infections.     Labs: Basic Metabolic Panel:  Recent Labs Lab 01/16/14 1706 01/17/14 0612 01/18/14 0426  NA 141 144 146  K 4.6 5.1 4.1  CL 111 117* 116*  CO2 13* 13* 16*  GLUCOSE 135* 145* 171*  BUN 64* 57* 43*  CREATININE 3.02* 2.73* 2.24*  CALCIUM 8.7 8.4 8.1*  MG 2.5  --   --    Liver Function Tests:  Recent Labs Lab 01/16/14 1706  AST 16  ALT 8  ALKPHOS 41  BILITOT 0.2*  PROT 8.7*  ALBUMIN 3.3*   No results found for this basename: LIPASE, AMYLASE,  in the last 168 hours No results found for this basename: AMMONIA,  in the last 168 hours CBC:  Recent Labs Lab 01/16/14 1706  01/17/14 0612  01/17/14 1141 01/17/14 1949 01/17/14 2300 01/18/14 0426 01/19/14 0558  WBC 6.7  --  5.2  --  4.2 5.6 4.8 5.5  NEUTROABS 5.4  --   --   --   --   --   --   --   HGB 4.8*  < > 8.0* 7.7* 9.1* 9.6* 9.3* 9.3*  HCT 15.9*  < > 25.1* 24.1* 27.9* 29.9* 28.2* 29.3*  MCV 90.9  --  88.4  --  87.2 88.7 87.6 89.6  PLT 334  --  282  --  275 258 267 272  < > = values in this interval not displayed. Cardiac Enzymes:  Recent Labs Lab 01/16/14 1706 01/17/14 0005 01/17/14 0612 01/17/14 1141  TROPONINI 0.42* 0.39* 0.74* 0.76*   BNP: BNP (last 3 results)  Recent Labs  07/22/13 1420  PROBNP 27695.0*   CBG:  Recent Labs Lab 01/18/14 1651 01/18/14 2044 01/19/14 0731 01/19/14 1143 01/19/14 1657  GLUCAP 123* 203* 147* 146* 204*       Signed:  Kadynce Bonds  [facility] 01/19/2014, 7:12 PM

## 2014-01-19 NOTE — Progress Notes (Signed)
SUBJECTIVE: Pt denies chest pain and shortness of breath.     Intake/Output Summary (Last 24 hours) at 01/19/14 1330 Last data filed at 01/19/14 1307  Gross per 24 hour  Intake    480 ml  Output      0 ml  Net    480 ml    Current Facility-Administered Medications  Medication Dose Route Frequency Provider Last Rate Last Dose  . donepezil (ARICEPT) tablet 5 mg  5 mg Oral Daily Phillips Grout, MD   5 mg at 01/19/14 1020  . hydrALAZINE (APRESOLINE) tablet 50 mg  50 mg Oral 3 times per day Phillips Grout, MD   50 mg at 01/19/14 0559  . insulin aspart (novoLOG) injection 0-9 Units  0-9 Units Subcutaneous TID WC Modena Jansky, MD   1 Units at 01/19/14 1311  . isosorbide mononitrate (IMDUR) 24 hr tablet 15 mg  15 mg Oral Daily Satira Sark, MD   15 mg at 01/19/14 1019  . levETIRAcetam (KEPPRA) 100 MG/ML solution 500 mg  500 mg Oral BID Phillips Grout, MD   500 mg at 01/19/14 1019  . metoprolol succinate (TOPROL-XL) 24 hr tablet 50 mg  50 mg Oral Daily Phillips Grout, MD   50 mg at 01/19/14 1020  . pantoprazole (PROTONIX) EC tablet 40 mg  40 mg Oral Daily Orvil Feil, NP   40 mg at 01/19/14 0600  . sodium bicarbonate tablet 650 mg  650 mg Oral TID Kathie Dike, MD   650 mg at 01/19/14 1019    Filed Vitals:   01/18/14 1700 01/18/14 2031 01/19/14 0448 01/19/14 1015  BP:  136/82 113/71 144/77  Pulse: 77 74 81 90  Temp:  97.9 F (36.6 C) 98.8 F (37.1 C)   TempSrc:  Oral Oral   Resp: 13 18 20    Height:      Weight:      SpO2: 100% 98% 94%     PHYSICAL EXAM General: NAD Neck: No JVD, no thyromegaly.  Lungs: Faint bibasilar crackles with normal respiratory effort. CV: Nondisplaced PMI.  Regular rate and rhythm, normal S1/S2, no S3/S4, no murmur.  No pretibial edema.  No carotid bruit.  Normal pedal pulses.  Abdomen: Soft, nontender, no hepatosplenomegaly, no distention.  Neurologic: Alert and oriented x 3.  Psych: Normal affect. Extremities: No clubbing or  cyanosis.   TELEMETRY: Reviewed telemetry pt in sinus rhythm with PVC's.  LABS: Basic Metabolic Panel:  Recent Labs  01/16/14 1706 01/17/14 0612 01/18/14 0426  NA 141 144 146  K 4.6 5.1 4.1  CL 111 117* 116*  CO2 13* 13* 16*  GLUCOSE 135* 145* 171*  BUN 64* 57* 43*  CREATININE 3.02* 2.73* 2.24*  CALCIUM 8.7 8.4 8.1*  MG 2.5  --   --    Liver Function Tests:  Recent Labs  01/16/14 1706  AST 16  ALT 8  ALKPHOS 41  BILITOT 0.2*  PROT 8.7*  ALBUMIN 3.3*   No results found for this basename: LIPASE, AMYLASE,  in the last 72 hours CBC:  Recent Labs  01/16/14 1706  01/18/14 0426 01/19/14 0558  WBC 6.7  < > 4.8 5.5  NEUTROABS 5.4  --   --   --   HGB 4.8*  < > 9.3* 9.3*  HCT 15.9*  < > 28.2* 29.3*  MCV 90.9  < > 87.6 89.6  PLT 334  < > 267 272  < > =  values in this interval not displayed. Cardiac Enzymes:  Recent Labs  01/17/14 0005 01/17/14 0612 01/17/14 1141  TROPONINI 0.39* 0.74* 0.76*   BNP: No components found with this basename: POCBNP,  D-Dimer: No results found for this basename: DDIMER,  in the last 72 hours Hemoglobin A1C: No results found for this basename: HGBA1C,  in the last 72 hours Fasting Lipid Panel: No results found for this basename: CHOL, HDL, LDLCALC, TRIG, CHOLHDL, LDLDIRECT,  in the last 72 hours Thyroid Function Tests: No results found for this basename: TSH, T4TOTAL, FREET3, T3FREE, THYROIDAB,  in the last 72 hours Anemia Panel:  Recent Labs  01/16/14 1918  VITAMINB12 460  FOLATE >20.0  FERRITIN 28  TIBC 322  IRON 12*  RETICCTPCT 6.3*    RADIOLOGY: Ct Head Wo Contrast  01/16/2014   CLINICAL DATA:  Altered mental status.  EXAM: CT HEAD WITHOUT CONTRAST  TECHNIQUE: Contiguous axial images were obtained from the base of the skull through the vertex without intravenous contrast.  COMPARISON:  Head CT scan 09/03/2012.  FINDINGS: The brain is atrophic with chronic microvascular ischemic change. Remote right PCA territory  infarct is again seen. Remote basal ganglia lacunar infarctions are noted. No evidence of acute abnormality including infarct, hemorrhage, mass lesion, mass effect, midline shift or abnormal extra-axial fluid collection. No hydrocephalus or pneumocephalus. The calvarium is intact.  IMPRESSION: No acute finding.  Stable compared to prior exam.   Electronically Signed   By: Inge Rise M.D.   On: 01/16/2014 16:26      ASSESSMENT AND PLAN: 1. NSTEMI, most likely type II event in the setting of severe anemia and physiologic stress.   2. Known history of multivessel CAD status post CABG in 2002 as detailed above. LVEF 40-45% as of 2013.  3. GI bleed, hemoglobin 4.8 at presentation. Patient has previous history of GI bleeding with documentation of erosive gastritis and duodenitis as of 2002 based on record review.  4. Type 2 diabetes mellitus.  5. Dementia, possibly multi-infarct.  6. Cerebrovascular disease status post strokes.  7. Hypertension.  8. Hyperlipidemia.  9. CKD, likely stage IV. No ACEI or ARB.  Troponins peaked at 0.76 on 2/24, likely secondary to severe anemia and consequent demand ischemia. Most recent Hgb 9.3. EGD did not reveal active bleeding. Outpatient colonoscopy planned. Echo (personally reviewed) showed EF 40% with wall motion abnormalities as noted above, moderate LVH, grade I diastolic dysfunction, and posterior mitral leaflet restricted mobility with resulting severe mitral regurgitation.  Continue present medical management (hydralazine, Imdur, metoprolol), monitoring for evidence of heart failure and atrial arrhythmias given moderately reduced LV systolic function and severe MR. She had some faint bibasilar rales today, but was admitted with dehydration. Thus, before resuming outpatient Lasix dosing of 20 mg daily, I will obtain a portable chest film.   Kate Sable, M.D., F.A.C.C.

## 2014-01-20 ENCOUNTER — Encounter: Payer: Self-pay | Admitting: Internal Medicine

## 2014-01-20 ENCOUNTER — Encounter (HOSPITAL_COMMUNITY): Payer: Self-pay | Admitting: Internal Medicine

## 2014-01-23 ENCOUNTER — Encounter: Payer: Self-pay | Admitting: Gastroenterology

## 2014-01-23 ENCOUNTER — Telehealth: Payer: Self-pay

## 2014-01-23 NOTE — Telephone Encounter (Signed)
Pt is aware of OV on 3/30 at 0930 with [facility] and appt card was mailed (with note saying to bring someone with her)

## 2014-01-23 NOTE — Telephone Encounter (Signed)
Letter from: Daneil Dolin  Reason for Letter: Results Review  Send letter to patient.  Send copy of letter with path to referring provider and PCP.  Almyra Free,  Pt needs prevpak x 14 days; hold protonix and pravastain during treatment and then resume.

## 2014-01-23 NOTE — Telephone Encounter (Signed)
Results Cc to PCP  

## 2014-01-24 MED ORDER — AMOXICILL-CLARITHRO-LANSOPRAZ PO MISC: Freq: Two times a day (BID) | ORAL | Status: DC

## 2014-01-24 NOTE — Telephone Encounter (Signed)
Spoke with pts daughterOlegario Shearer and she is aware of diagnosis and recommendations to stop medications while taking prevpac. rx sent to the pharmacy.

## 2014-01-24 NOTE — Telephone Encounter (Signed)
Tried to call pt- NA and voicemail has not been set up yet. 

## 2014-01-26 NOTE — Care Management Note (Signed)
UR completed 

## 2014-02-20 ENCOUNTER — Other Ambulatory Visit: Payer: Self-pay | Admitting: Gastroenterology

## 2014-02-20 ENCOUNTER — Encounter: Payer: Self-pay | Admitting: Gastroenterology

## 2014-02-20 ENCOUNTER — Encounter (INDEPENDENT_AMBULATORY_CARE_PROVIDER_SITE_OTHER): Payer: Self-pay

## 2014-02-20 ENCOUNTER — Ambulatory Visit (INDEPENDENT_AMBULATORY_CARE_PROVIDER_SITE_OTHER): Payer: Medicare Other | Admitting: Gastroenterology

## 2014-02-20 ENCOUNTER — Ambulatory Visit (HOSPITAL_COMMUNITY)
Admission: RE | Admit: 2014-02-20 | Discharge: 2014-02-20 | Disposition: A | Discharge status: Home / Self Care | Payer: Medicare Other | Source: Ambulatory Visit | Attending: Gastroenterology | Admitting: Gastroenterology

## 2014-02-20 VITALS — BP 141/70 | HR 77 | Temp 97.6°F | Ht 62.0 in | Wt 156.8 lb

## 2014-02-20 DIAGNOSIS — R197 Diarrhea, unspecified: Secondary | ICD-10-CM | POA: Insufficient documentation

## 2014-02-20 DIAGNOSIS — I259 Chronic ischemic heart disease, unspecified: Secondary | ICD-10-CM

## 2014-02-20 DIAGNOSIS — R600 Localized edema: Secondary | ICD-10-CM

## 2014-02-20 DIAGNOSIS — D649 Anemia, unspecified: Secondary | ICD-10-CM

## 2014-02-20 DIAGNOSIS — R609 Edema, unspecified: Secondary | ICD-10-CM

## 2014-02-20 MED ORDER — RESTORA PO CAPS: 1.0000 | ORAL_CAPSULE | Freq: Every day | ORAL | Status: DC

## 2014-02-20 NOTE — Assessment & Plan Note (Signed)
Recent profound normocytic anemia with anemia profile most consistent with iron deficiency. She was Hemoccult positive. EGD revealed H. pylori gastritis it was felt like her degree of anemia was not explained by this. Colonoscopy is recommended as the next step. We will get this scheduled in the near future but initially we need to check for C. difficile and rule out DVT as outlined below. For now she is on Aggrenox but no aspirin. We have requested her most recent labs from Dr. Hinda Lenis to followup on her anemia

## 2014-02-20 NOTE — Progress Notes (Signed)
cc'd to pcp 

## 2014-02-20 NOTE — Assessment & Plan Note (Addendum)
Diarrhea noted since antibiotic therapy for H. pylori. 2-3 loose to soft stools daily. Suspect antibiotic associated but she is at increased risk of C. difficile. Check C. difficile PCR. Start restora one daily for 30 days.

## 2014-02-20 NOTE — Patient Instructions (Addendum)
1. Please collect stool specimen as soon as possible. 2. Please have your ultrasound of the right leg done as scheduled. 3. Start restora once daily. Samples provided.  4. Once results come back, we will proceed with colonoscopy as appropriate.

## 2014-02-20 NOTE — Assessment & Plan Note (Signed)
Acute onset right lower lower extremity edema. Noted in the leg that she has had vein grafting for coronary artery bypass surgery. Need to rule out DVT. Plan for Doppler study today.

## 2014-02-20 NOTE — Progress Notes (Signed)
Primary Care Physician:  [name], MD  Primary Gastroenterologist:  Barney Drain, MD   Chief Complaint  Patient presents with  . Colonoscopy  . Follow-up    HPI:  [name] is a 73 y.o. female here for hospital followup. Back in February she presented to emergency department with her daughter secondary to progressive weakness. Patient has dementia and confusion at baseline. Reported history of "dark stools". Intermittently has constipation. Admitted with hemoglobin of 4.8. She received 3 units of packed red blood cells. Was on aspirin and Aggrenox as an outpatient. Heme positive during hospitalization. She had NSTEMI during hospitalization. EGD during hospitalization showed a small hiatal hernia, abnormal gastric and duodenal bulbar mucosa, biopsy showed gastritis and H. Pylori (treated with Prevpac). Hemoglobin at time of discharge was 9.3. Last colonoscopy 2002.   History provided solely by daughter [name] due to patient's dementia. She's been back on Aggrenox for couple weeks. Not taking aspirin. BM little loose since been on antibiotics for H.Pylori. No melnea, brbpr. BM twice per day. No evidence of abdominal pain. Finished abx last week. Eating fine. No dysphagia. Had labs with Dr. Hinda Lenis since hospitalization. Concerned about right lower extremity edema.    Current Outpatient Prescriptions  Medication Sig Dispense Refill  . donepezil (ARICEPT) 5 MG tablet Take 5 mg by mouth daily.      . folic acid (FOLVITE) 1 MG tablet Take 1 mg by mouth daily.      . furosemide (LASIX) 20 MG tablet Take 1 tablet (20 mg total) by mouth daily.  30 tablet  1  . glipiZIDE (GLUCOTROL) 5 MG tablet Take 0.5 tablets (2.5 mg total) by mouth daily before breakfast.  30 tablet  1  . hydrALAZINE (APRESOLINE) 50 MG tablet Take 1 tablet (50 mg total) by mouth every 8 (eight) hours.  90 tablet  0  . isosorbide mononitrate (IMDUR) 15 mg TB24 24 hr tablet Take 0.5 tablets (15 mg total) by mouth daily.  30  tablet  1  . levETIRAcetam (KEPPRA) 100 MG/ML solution Take 5 mLs by mouth 2 (two) times daily.      . metoprolol (TOPROL-XL) 100 MG 24 hr tablet Take 50 mg by mouth daily.       . pantoprazole (PROTONIX) 40 MG tablet Take 1 tablet (40 mg total) by mouth daily.  30 tablet  1  . potassium chloride (K-DUR) 10 MEQ tablet Take 1 tablet (10 mEq total) by mouth daily.  30 tablet  1  . pravastatin (PRAVACHOL) 20 MG tablet Take 20 mg by mouth daily.        . sodium bicarbonate 650 MG tablet Take 650 mg by mouth 3 (three) times daily.      . Vitamin D, Ergocalciferol, (DRISDOL) 50000 UNITS CAPS Take 50,000 Units by mouth every 7 (seven) days. Takes on Tuesday      . AGGRENOX 25-200 MG per 12 hr capsule        No current facility-administered medications for this visit.    Allergies as of 02/20/2014  . (No Known Allergies)    Past Medical History  Diagnosis Date  . Type 2 diabetes mellitus   . Essential hypertension, benign   . History of stroke     Previously on Coumadin  . MI, old     Reported 89  . Seizure disorder   . Coronary atherosclerosis of native coronary artery     Multivessel status post CABG 2002  . History of GI bleed  Erosive gastritis and duodenitis by EGD 2002  . Mixed hyperlipidemia   . Ischemic cardiomyopathy     LVEF 40-45% March 2013  . Dementia   . CKD (chronic kidney disease) stage 4, GFR 15-29 ml/min     Past Surgical History  Procedure Laterality Date  . Coronary artery bypass graft  July 2002    LIMA to LAD, SVG to OM1, SVG to OM 2, SVG to RCA  . Tee without cardioversion  01/28/2012    Procedure: TRANSESOPHAGEAL ECHOCARDIOGRAM (TEE);  Surgeon: Lelon Perla, MD;  Location: [facility] ENDOSCOPY;  Service: Cardiovascular;  Laterality: [name];  . Colonoscopy  2002  . Esophagogastroduodenoscopy  2002  . Esophagogastroduodenoscopy [name] 01/18/2014    JJK:KXFGH hiatal hernia. Abnormal gastric and duodenal bulbar mucosa of uncertain significance - status post gastric  bx (chronic gastritis/H.pylori +    Family History  Problem Relation Age of Onset  . Stroke Father   . Diabetes type II Mother   . Colon cancer Neg Hx     History   Social History  . Marital Status: Single    Spouse Name: [name]    Number of Children: [name]  . Years of Education: [name]   Occupational History  . Not on file.   Social History Main Topics  . Smoking status: Former Smoker -- 1.00 packs/day for 50 years    Types: Cigarettes  . Smokeless tobacco: Not on file  . Alcohol Use: No  . Drug Use: No  . Sexual Activity: Not on file   Other Topics Concern  . Not on file   Social History Narrative  . No narrative on file      ROS:per daughter  General: Negative for anorexia, weight loss, fever, chills, fatigue, weakness. Eyes: Negative for vision changes.  ENT: Negative for hoarseness, difficulty swallowing , nasal congestion. CV: Negative for chest pain, angina, palpitations, dyspnea on exertion. + RLE edema.  Respiratory: Negative for dyspnea at rest, dyspnea on exertion, cough, sputum, wheezing.  GI: See history of present illness. GU:  Negative for dysuria, hematuria, urinary incontinence, urinary frequency, nocturnal urination.  MS: Negative for joint pain, low back pain.  Derm: Negative for rash or itching.  Neuro: Negative for weakness, abnormal sensation, seizure, frequent headaches, memory loss, confusion.  Psych: Negative for anxiety, depression, suicidal ideation, hallucinations.  Endo: Negative for unusual weight change.  Heme: Negative for bruising or bleeding. Allergy: Negative for rash or hives.    Physical Examination:  BP 141/70  Pulse 77  Temp(Src) 97.6 F (36.4 C) (Oral)  Ht 5\' 2"  (1.575 m)  Wt 156 lb 12.8 oz (71.124 kg)  BMI 28.67 kg/m2   General: Well-nourished, well-developed in no acute distress. Alert and oriented to person and place. Head: Normocephalic, atraumatic.   Eyes: Conjunctiva pink, no icterus. Mouth: Oropharyngeal mucosa  moist and pink , no lesions erythema or exudate. Neck: Supple without thyromegaly, masses, or lymphadenopathy.  Lungs: Clear to auscultation bilaterally.  Heart: Regular rate and rhythm, no murmurs rubs or gallops.  Abdomen: Bowel sounds are normal, nontender, nondistended, no hepatosplenomegaly or masses, no abdominal bruits or    hernia , no rebound or guarding.   Rectal: not performed Extremities: right LE with 2+ pitting edema, left LE no edema. No clubbing or deformities.  Neuro: Alert and oriented to person, place , grossly normal neurologically.  Skin: Warm and dry, no rash or jaundice.   Psych: Alert and cooperative, normal mood and affect.  Labs: Lab Results  Component  Value Date   WBC 5.5 01/19/2014   HGB 9.3* 01/19/2014   HCT 29.3* 01/19/2014   MCV 89.6 01/19/2014   PLT 272 01/19/2014   Lab Results  Component Value Date   CREATININE 2.24* 01/18/2014   BUN 43* 01/18/2014   NA 146 01/18/2014   K 4.1 01/18/2014   CL 116* 01/18/2014   CO2 16* 01/18/2014   Lab Results  Component Value Date   ALT 8 01/16/2014   AST 16 01/16/2014   ALKPHOS 41 01/16/2014   BILITOT 0.2* 01/16/2014   Lab Results  Component Value Date   IRON 12* 01/16/2014   TIBC 322 01/16/2014   FERRITIN 28 01/16/2014   Lab Results  Component Value Date   VITAMINB12 460 01/16/2014   Lab Results  Component Value Date   FOLATE >20.0 01/16/2014     Imaging Studies: No results found.

## 2014-02-21 ENCOUNTER — Encounter (HOSPITAL_COMMUNITY): Payer: Self-pay | Admitting: Emergency Medicine

## 2014-02-21 ENCOUNTER — Encounter: Payer: Self-pay | Admitting: Cardiology

## 2014-02-21 ENCOUNTER — Emergency Department (HOSPITAL_COMMUNITY): Payer: Medicare Other

## 2014-02-21 ENCOUNTER — Encounter: Payer: Medicare Other | Admitting: Cardiology

## 2014-02-21 ENCOUNTER — Inpatient Hospital Stay (HOSPITAL_COMMUNITY)
Admission: EM | Admit: 2014-02-21 | Discharge: 2014-02-25 | DRG: 292 | Disposition: A | Discharge status: Home Health | Payer: Medicare Other | Attending: Family Medicine | Admitting: Family Medicine

## 2014-02-21 DIAGNOSIS — G40909 Epilepsy, unspecified, not intractable, without status epilepticus: Secondary | ICD-10-CM | POA: Diagnosis present

## 2014-02-21 DIAGNOSIS — N184 Chronic kidney disease, stage 4 (severe): Secondary | ICD-10-CM

## 2014-02-21 DIAGNOSIS — I509 Heart failure, unspecified: Secondary | ICD-10-CM

## 2014-02-21 DIAGNOSIS — Z8673 Personal history of transient ischemic attack (TIA), and cerebral infarction without residual deficits: Secondary | ICD-10-CM

## 2014-02-21 DIAGNOSIS — N183 Chronic kidney disease, stage 3 unspecified: Secondary | ICD-10-CM

## 2014-02-21 DIAGNOSIS — I5043 Acute on chronic combined systolic (congestive) and diastolic (congestive) heart failure: Principal | ICD-10-CM

## 2014-02-21 DIAGNOSIS — E119 Type 2 diabetes mellitus without complications: Secondary | ICD-10-CM

## 2014-02-21 DIAGNOSIS — I2589 Other forms of chronic ischemic heart disease: Secondary | ICD-10-CM | POA: Diagnosis present

## 2014-02-21 DIAGNOSIS — I2510 Atherosclerotic heart disease of native coronary artery without angina pectoris: Secondary | ICD-10-CM | POA: Diagnosis present

## 2014-02-21 DIAGNOSIS — I679 Cerebrovascular disease, unspecified: Secondary | ICD-10-CM | POA: Diagnosis present

## 2014-02-21 DIAGNOSIS — E782 Mixed hyperlipidemia: Secondary | ICD-10-CM | POA: Diagnosis present

## 2014-02-21 DIAGNOSIS — I340 Nonrheumatic mitral (valve) insufficiency: Secondary | ICD-10-CM

## 2014-02-21 DIAGNOSIS — F0390 Unspecified dementia without behavioral disturbance: Secondary | ICD-10-CM

## 2014-02-21 DIAGNOSIS — Z87891 Personal history of nicotine dependence: Secondary | ICD-10-CM

## 2014-02-21 DIAGNOSIS — Z951 Presence of aortocoronary bypass graft: Secondary | ICD-10-CM

## 2014-02-21 DIAGNOSIS — Z823 Family history of stroke: Secondary | ICD-10-CM

## 2014-02-21 DIAGNOSIS — D509 Iron deficiency anemia, unspecified: Secondary | ICD-10-CM | POA: Diagnosis present

## 2014-02-21 DIAGNOSIS — I252 Old myocardial infarction: Secondary | ICD-10-CM

## 2014-02-21 DIAGNOSIS — N179 Acute kidney failure, unspecified: Secondary | ICD-10-CM

## 2014-02-21 DIAGNOSIS — N189 Chronic kidney disease, unspecified: Secondary | ICD-10-CM

## 2014-02-21 DIAGNOSIS — Z833 Family history of diabetes mellitus: Secondary | ICD-10-CM

## 2014-02-21 DIAGNOSIS — E876 Hypokalemia: Secondary | ICD-10-CM

## 2014-02-21 DIAGNOSIS — Z66 Do not resuscitate: Secondary | ICD-10-CM | POA: Diagnosis present

## 2014-02-21 DIAGNOSIS — I059 Rheumatic mitral valve disease, unspecified: Secondary | ICD-10-CM | POA: Diagnosis present

## 2014-02-21 DIAGNOSIS — Z79899 Other long term (current) drug therapy: Secondary | ICD-10-CM

## 2014-02-21 DIAGNOSIS — N289 Disorder of kidney and ureter, unspecified: Secondary | ICD-10-CM

## 2014-02-21 DIAGNOSIS — I129 Hypertensive chronic kidney disease with stage 1 through stage 4 chronic kidney disease, or unspecified chronic kidney disease: Secondary | ICD-10-CM | POA: Diagnosis present

## 2014-02-21 HISTORY — DX: Unspecified convulsions: R56.9

## 2014-02-21 LAB — BASIC METABOLIC PANEL
BUN: 35 mg/dL — ABNORMAL HIGH (ref 6–23)
CHLORIDE: 107 meq/L (ref 96–112)
CO2: 23 mEq/L (ref 19–32)
Calcium: 8.3 mg/dL — ABNORMAL LOW (ref 8.4–10.5)
Creatinine, Ser: 2.05 mg/dL — ABNORMAL HIGH (ref 0.50–1.10)
GFR calc Af Amer: 27 mL/min — ABNORMAL LOW (ref >90)
GFR, EST NON AFRICAN AMERICAN: 23 mL/min — AB (ref >90)
GLUCOSE: 166 mg/dL — AB (ref 70–99)
POTASSIUM: 3 meq/L — AB (ref 3.7–5.3)
SODIUM: 143 meq/L (ref 137–147)

## 2014-02-21 LAB — TROPONIN I

## 2014-02-21 LAB — CBC WITH DIFFERENTIAL/PLATELET
Basophils Absolute: 0 10*3/uL (ref 0.0–0.1)
Basophils Relative: 0 % (ref 0–1)
EOS ABS: 0 10*3/uL (ref 0.0–0.7)
Eosinophils Relative: 1 % (ref 0–5)
HEMATOCRIT: 27.6 % — AB (ref 36.0–46.0)
Hemoglobin: 8.7 g/dL — ABNORMAL LOW (ref 12.0–15.0)
Lymphocytes Relative: 18 % (ref 12–46)
Lymphs Abs: 0.7 10*3/uL (ref 0.7–4.0)
MCH: 27 pg (ref 26.0–34.0)
MCHC: 31.5 g/dL (ref 30.0–36.0)
MCV: 85.7 fL (ref 78.0–100.0)
MONO ABS: 0.4 10*3/uL (ref 0.1–1.0)
Monocytes Relative: 10 % (ref 3–12)
Neutro Abs: 3 10*3/uL (ref 1.7–7.7)
Neutrophils Relative %: 71 % (ref 43–77)
Platelets: 268 10*3/uL (ref 150–400)
RBC: 3.22 MIL/uL — ABNORMAL LOW (ref 3.87–5.11)
RDW: 17.3 % — ABNORMAL HIGH (ref 11.5–15.5)
WBC: 4.2 10*3/uL (ref 4.0–10.5)

## 2014-02-21 LAB — GLUCOSE, CAPILLARY
GLUCOSE-CAPILLARY: 133 mg/dL — AB (ref 70–99)
GLUCOSE-CAPILLARY: 185 mg/dL — AB (ref 70–99)
Glucose-Capillary: 178 mg/dL — ABNORMAL HIGH (ref 70–99)

## 2014-02-21 LAB — MAGNESIUM: Magnesium: 2.1 mg/dL (ref 1.5–2.5)

## 2014-02-21 LAB — PRO B NATRIURETIC PEPTIDE: Pro B Natriuretic peptide (BNP): 35849 pg/mL — ABNORMAL HIGH (ref 0–125)

## 2014-02-21 MED ORDER — HYDRALAZINE HCL 25 MG PO TABS
50.0000 mg | ORAL_TABLET | Freq: Three times a day (TID) | ORAL | Status: DC
  Administered 2014-02-21 – 2014-02-23 (×6): 50 mg via ORAL
  Filled 2014-02-21 (×6): qty 2

## 2014-02-21 MED ORDER — SIMVASTATIN 10 MG PO TABS
10.0000 mg | ORAL_TABLET | Freq: Every day | ORAL | Status: DC
  Administered 2014-02-21 – 2014-02-25 (×5): 10 mg via ORAL
  Filled 2014-02-21 (×5): qty 1

## 2014-02-21 MED ORDER — VITAMIN D (ERGOCALCIFEROL) 1.25 MG (50000 UNIT) PO CAPS: 50000.0000 [IU] | ORAL_CAPSULE | ORAL | Status: DC

## 2014-02-21 MED ORDER — INSULIN ASPART 100 UNIT/ML ~~LOC~~ SOLN
0.0000 [IU] | Freq: Every day | SUBCUTANEOUS | Status: DC
  Administered 2014-02-22: 2 [IU] via SUBCUTANEOUS

## 2014-02-21 MED ORDER — ISOSORBIDE MONONITRATE ER 30 MG PO TB24
15.0000 mg | ORAL_TABLET | Freq: Every day | ORAL | Status: DC
  Administered 2014-02-22 – 2014-02-23 (×2): 15 mg via ORAL
  Filled 2014-02-21 (×2): qty 1

## 2014-02-21 MED ORDER — POTASSIUM CHLORIDE 20 MEQ/15ML (10%) PO LIQD
40.0000 meq | Freq: Once | ORAL | Status: AC
  Administered 2014-02-21: 40 meq via ORAL
  Filled 2014-02-21: qty 30

## 2014-02-21 MED ORDER — LEVETIRACETAM 100 MG/ML PO SOLN
500.0000 mg | Freq: Two times a day (BID) | ORAL | Status: DC
  Administered 2014-02-21 – 2014-02-25 (×8): 500 mg via ORAL
  Filled 2014-02-21 (×12): qty 5

## 2014-02-21 MED ORDER — FOLIC ACID 1 MG PO TABS
1.0000 mg | ORAL_TABLET | Freq: Every day | ORAL | Status: DC
  Administered 2014-02-22 – 2014-02-25 (×4): 1 mg via ORAL
  Filled 2014-02-21 (×4): qty 1

## 2014-02-21 MED ORDER — RISAQUAD PO CAPS
1.0000 | ORAL_CAPSULE | Freq: Every day | ORAL | Status: DC
  Administered 2014-02-22 – 2014-02-25 (×4): 1 via ORAL
  Filled 2014-02-21 (×4): qty 1

## 2014-02-21 MED ORDER — HEPARIN SODIUM (PORCINE) 5000 UNIT/ML IJ SOLN
5000.0000 [IU] | Freq: Three times a day (TID) | INTRAMUSCULAR | Status: DC
  Administered 2014-02-21 – 2014-02-25 (×12): 5000 [IU] via SUBCUTANEOUS
  Filled 2014-02-21 (×13): qty 1

## 2014-02-21 MED ORDER — SODIUM BICARBONATE 650 MG PO TABS
650.0000 mg | ORAL_TABLET | Freq: Three times a day (TID) | ORAL | Status: DC
  Administered 2014-02-21 – 2014-02-25 (×12): 650 mg via ORAL
  Filled 2014-02-21 (×12): qty 1

## 2014-02-21 MED ORDER — ONDANSETRON HCL 4 MG PO TABS: 4.0000 mg | ORAL_TABLET | Freq: Four times a day (QID) | ORAL | Status: DC | PRN

## 2014-02-21 MED ORDER — DONEPEZIL HCL 5 MG PO TABS
5.0000 mg | ORAL_TABLET | Freq: Every day | ORAL | Status: DC
  Administered 2014-02-21 – 2014-02-24 (×4): 5 mg via ORAL
  Filled 2014-02-21 (×4): qty 1

## 2014-02-21 MED ORDER — ONDANSETRON HCL 4 MG/2ML IJ SOLN: 4.0000 mg | Freq: Four times a day (QID) | INTRAMUSCULAR | Status: DC | PRN

## 2014-02-21 MED ORDER — ASPIRIN-DIPYRIDAMOLE ER 25-200 MG PO CP12
1.0000 | ORAL_CAPSULE | Freq: Two times a day (BID) | ORAL | Status: DC
  Administered 2014-02-21 – 2014-02-25 (×8): 1 via ORAL
  Filled 2014-02-21 (×12): qty 1

## 2014-02-21 MED ORDER — SODIUM CHLORIDE 0.9 % IJ SOLN
3.0000 mL | Freq: Two times a day (BID) | INTRAMUSCULAR | Status: DC
  Administered 2014-02-21 – 2014-02-25 (×7): 3 mL via INTRAVENOUS

## 2014-02-21 MED ORDER — PANTOPRAZOLE SODIUM 40 MG PO TBEC
40.0000 mg | DELAYED_RELEASE_TABLET | Freq: Every day | ORAL | Status: DC
  Administered 2014-02-22 – 2014-02-25 (×4): 40 mg via ORAL
  Filled 2014-02-21 (×4): qty 1

## 2014-02-21 MED ORDER — SODIUM CHLORIDE 0.9 % IV SOLN
INTRAVENOUS | Status: AC
  Administered 2014-02-21: 17:00:00 via INTRAVENOUS

## 2014-02-21 MED ORDER — FUROSEMIDE 10 MG/ML IJ SOLN: 40.0000 mg | Freq: Once | INTRAMUSCULAR | Status: AC

## 2014-02-21 MED ORDER — INSULIN ASPART 100 UNIT/ML ~~LOC~~ SOLN
0.0000 [IU] | Freq: Three times a day (TID) | SUBCUTANEOUS | Status: DC
  Administered 2014-02-21: 1 [IU] via SUBCUTANEOUS
  Administered 2014-02-22: 2 [IU] via SUBCUTANEOUS
  Administered 2014-02-22: 3 [IU] via SUBCUTANEOUS
  Administered 2014-02-23: 2 [IU] via SUBCUTANEOUS
  Administered 2014-02-23: 3 [IU] via SUBCUTANEOUS
  Administered 2014-02-24 – 2014-02-25 (×6): 2 [IU] via SUBCUTANEOUS

## 2014-02-21 MED ORDER — GLIPIZIDE 5 MG PO TABS
2.5000 mg | ORAL_TABLET | Freq: Every day | ORAL | Status: DC
  Administered 2014-02-22 – 2014-02-25 (×4): 2.5 mg via ORAL
  Filled 2014-02-21 (×4): qty 1

## 2014-02-21 MED ORDER — FUROSEMIDE 10 MG/ML IJ SOLN
60.0000 mg | Freq: Two times a day (BID) | INTRAMUSCULAR | Status: DC
  Administered 2014-02-21 – 2014-02-23 (×5): 60 mg via INTRAVENOUS
  Filled 2014-02-21 (×6): qty 6

## 2014-02-21 NOTE — ED Notes (Signed)
Pt reports swelling in lower legs since Sunday, r worse than left, and sob that started yesterday.  Denies any cough or chest pain.

## 2014-02-21 NOTE — ED Provider Notes (Signed)
CSN: [id number]     Arrival date & time 02/21/14  1140 History   First MD Initiated Contact with Patient 02/21/14 1311     Chief Complaint  Patient presents with  . Shortness of Breath      Patient is a 73 y.o. female presenting with shortness of breath. The history is provided by the patient and a relative. The history is limited by the condition of the patient (Hx dementia).  Shortness of Breath Pt was seen at 1320. Per pt and her family, c/o gradual onset and persistence of constant SOB for the past 2 days. Has been associated with increasing pedal edema, R>L, for the past 3 days. Pt's family states they "missed" her scheduled Cards MD appointment this morning. Pt herself denies CP/palpitations, no cough, no fevers, no abd pain, no N/V/D, no back pain.     Past Medical History  Diagnosis Date  . Type 2 diabetes mellitus   . Essential hypertension, benign   . History of stroke     Previously on Coumadin  . MI, old     Reported 52  . Seizure disorder   . Coronary atherosclerosis of native coronary artery     Multivessel status post CABG 2002  . History of GI bleed     Erosive gastritis and duodenitis by EGD 2002  . Mixed hyperlipidemia   . Ischemic cardiomyopathy     LVEF 40-45% March 2013  . Dementia   . CKD (chronic kidney disease) stage 4, GFR 15-29 ml/min   . Stroke   . Seizures    Past Surgical History  Procedure Laterality Date  . Coronary artery bypass graft  July 2002    LIMA to LAD, SVG to OM1, SVG to OM 2, SVG to RCA  . Tee without cardioversion  01/28/2012    Procedure: TRANSESOPHAGEAL ECHOCARDIOGRAM (TEE);  Surgeon: Lewayne Bunting, MD;  Location: [facility] ENDOSCOPY;  Service: Cardiovascular;  Laterality: [name];  . Colonoscopy  2002  . Esophagogastroduodenoscopy  2002  . Esophagogastroduodenoscopy [name] 01/18/2014    XOV:ANVBT hiatal hernia. Abnormal gastric and duodenal bulbar mucosa of uncertain significance - status post gastric bx (chronic gastritis/H.pylori +    Family History  Problem Relation Age of Onset  . Stroke Father   . Diabetes type II Mother   . Colon cancer Neg Hx    History  Substance Use Topics  . Smoking status: Former Smoker -- 1.00 packs/day for 50 years    Types: Cigarettes  . Smokeless tobacco: Not on file  . Alcohol Use: No    Review of Systems  Unable to perform ROS: Dementia  Respiratory: Positive for shortness of breath.       Allergies  Review of patient's allergies indicates no known allergies.  Home Medications   Current Outpatient Rx  Name  Route  Sig  Dispense  Refill  . AGGRENOX 25-200 MG per 12 hr capsule   Oral   Take 1 capsule by mouth 2 (two) times daily.          Marland Kitchen donepezil (ARICEPT) 5 MG tablet   Oral   Take 5 mg by mouth daily.         . folic acid (FOLVITE) 1 MG tablet   Oral   Take 1 mg by mouth daily.         . furosemide (LASIX) 20 MG tablet   Oral   Take 1 tablet (20 mg total) by mouth daily.   30 tablet  1   . glipiZIDE (GLUCOTROL) 5 MG tablet   Oral   Take 0.5 tablets (2.5 mg total) by mouth daily before breakfast.   30 tablet   1   . hydrALAZINE (APRESOLINE) 50 MG tablet   Oral   Take 1 tablet (50 mg total) by mouth every 8 (eight) hours.   90 tablet   0   . isosorbide mononitrate (IMDUR) 15 mg TB24 24 hr tablet   Oral   Take 0.5 tablets (15 mg total) by mouth daily.   30 tablet   1   . levETIRAcetam (KEPPRA) 100 MG/ML solution   Oral   Take 5 mLs by mouth 2 (two) times daily.         . pantoprazole (PROTONIX) 40 MG tablet   Oral   Take 1 tablet (40 mg total) by mouth daily.   30 tablet   1   . potassium chloride (K-DUR) 10 MEQ tablet   Oral   Take 1 tablet (10 mEq total) by mouth daily.   30 tablet   1   . pravastatin (PRAVACHOL) 20 MG tablet   Oral   Take 20 mg by mouth daily.           . Probiotic Product (RESTORA) CAPS   Oral   Take 1 capsule by mouth daily.   30 capsule   0   . sodium bicarbonate 650 MG tablet   Oral    Take 650 mg by mouth 3 (three) times daily.         . Vitamin D, Ergocalciferol, (DRISDOL) 50000 UNITS CAPS   Oral   Take 50,000 Units by mouth every 7 (seven) days. Takes on Tuesday          BP 156/82  Pulse 75  Temp(Src) 97.8 F (36.6 C) (Oral)  Resp 22  SpO2 98% Physical Exam 1325: Physical examination:  Nursing notes reviewed; Vital signs and O2 SAT reviewed;  Constitutional: Well developed, Well nourished, Well hydrated, In no acute distress; Head:  Normocephalic, atraumatic; Eyes: EOMI, PERRL, No scleral icterus; ENMT: Mouth and pharynx normal, Mucous membranes moist; Neck: Supple, Full range of motion, No lymphadenopathy; Cardiovascular: Regular rate and rhythm, No gallop; Respiratory: Breath sounds coarse & equal bilaterally, No wheezes. Mild tachypnea. No retrax. Normal respiratory effort/excursion; Chest: Nontender, Movement normal; Abdomen: Soft, Nontender, Nondistended, Normal bowel sounds; Genitourinary: No CVA tenderness; Extremities: Pulses normal, No tenderness, +1 pedal edema with R>L calf asymmetry.; Neuro: Awake, alert, confused per hx dementia. Major CN grossly intact. No facial droop. Speech clear. No gross focal motor or sensory deficits in extremities.; Skin: Color normal, Warm, Dry.   ED Course  Procedures     EKG Interpretation None       EKG Interpretation  Date/Time:  Tuesday February 21 2014 14:33:25 EDT Ventricular Rate:  102 PR Interval:    QRS Duration: 94 QT Interval:  384 QTC Calculation: 500 R Axis:   48 Text Interpretation:  Undetermined rhythm Artifact Nonspecific ST and T wave abnormality Abnormal ECG When compared with ECG of 21-Feb-2014 12:07, Current undetermined rhythm precludes rhythm comparison, needs review Nonspecific T wave abnormality has replaced inverted T waves in Anterior leads QT has shortened Confirmed by [facility]  MD, Nunzio Cory [phone]) on 02/21/2014 2:40:42 PM         MDM  MDM Reviewed: previous chart, nursing note and  vitals Reviewed previous: ECG, labs and ultrasound Interpretation: ECG, labs and x-ray    Date: 02/21/2014 on arrival  Rate:  82  Rhythm: normal sinus rhythm, premature atrial contractions (PAC) and premature ventricular contractions (PVC), baseline wander, artifact  QRS Axis: normal  Intervals: QT prolonged, QTc 628  ST/T Wave abnormalities: nonspecific ST/T changes  Conduction Disutrbances:none  Narrative Interpretation:   Old EKG Reviewed: changes noted; QTc prolonged further compared to previous EKG dated 01/18/2014 (QTc 490).   US Venous Img Lower Unilateral Right 02/20/2014   CLINICAL DATA:  Right leg swelling  EXAM: Right LOWER EXTREMITY VENOUS DOPPLER ULTRASOUND  TECHNIQUE: Gray-scale sonography with graded compression, as well as color Doppler and duplex ultrasound, were performed to evaluate the deep venous system from the level of the common femoral vein through the popliteal and proximal calf veins. Spectral Doppler was utilized to evaluate flow at rest and with distal augmentation maneuvers.  COMPARISON:  None.  FINDINGS: Thrombus within deep veins:  None visualized.  Compressibility of deep veins:  Normal.  Duplex waveform respiratory phasicity:  Normal.  Duplex waveform response to augmentation:  Normal.  Venous reflux:  None visualized.  Other findings:  Diffuse calf edema is noted.  IMPRESSION: No evidence of deep venous thrombosis is noted.   Electronically Signed   By: Inez Catalina M.D.   On: 02/20/2014 12:01   Dg Chest Portable 1 View 02/21/2014   CLINICAL DATA:  Shortness of breath since this morning  EXAM: PORTABLE CHEST - 1 VIEW  COMPARISON:  DG CHEST 1V PORT dated 01/19/2014; DG CHEST 2 VIEW dated 07/22/2013; DG CHEST 1 VIEW dated 09/03/2012  FINDINGS: Stable mild to moderate cardiac enlargement in this patient who is status post CABG. There is vascular congestion. There is moderately severe interstitial prominence.  IMPRESSION: Cardiogenic pulmonary edema   Electronically  Signed   By: Skipper Cliche M.D.   On: 02/21/2014 13:08    Results for orders placed during the hospital encounter of 02/21/14  CBC WITH DIFFERENTIAL      Result Value Ref Range   WBC 4.2  4.0 - 10.5 K/uL   RBC 3.22 (*) 3.87 - 5.11 MIL/uL   Hemoglobin 8.7 (*) 12.0 - 15.0 g/dL   HCT 27.6 (*) 36.0 - 46.0 %   MCV 85.7  78.0 - 100.0 fL   MCH 27.0  26.0 - 34.0 pg   MCHC 31.5  30.0 - 36.0 g/dL   RDW 17.3 (*) 11.5 - 15.5 %   Platelets 268  150 - 400 K/uL   Neutrophils Relative % 71  43 - 77 %   Neutro Abs 3.0  1.7 - 7.7 K/uL   Lymphocytes Relative 18  12 - 46 %   Lymphs Abs 0.7  0.7 - 4.0 K/uL   Monocytes Relative 10  3 - 12 %   Monocytes Absolute 0.4  0.1 - 1.0 K/uL   Eosinophils Relative 1  0 - 5 %   Eosinophils Absolute 0.0  0.0 - 0.7 K/uL   Basophils Relative 0  0 - 1 %   Basophils Absolute 0.0  0.0 - 0.1 K/uL  BASIC METABOLIC PANEL      Result Value Ref Range   Sodium SAMPLE HEMOLYSED TO BE RECOLLECTED  137 - 147 mEq/L   GFR calc non Af Amer NOT CALCULATED  >90 mL/min   GFR calc Af Amer NOT CALCULATED  >90 mL/min  TROPONIN I      Result Value Ref Range   Troponin I <0.30  <0.30 ng/mL  PRO B NATRIURETIC PEPTIDE      Result Value Ref Range  Pro B Natriuretic peptide (BNP) 35849.0 (*) 0 - 125 pg/mL  MAGNESIUM      Result Value Ref Range   Magnesium 2.1  1.5 - 2.5 mg/dL  BASIC METABOLIC PANEL      Result Value Ref Range   Sodium 143  137 - 147 mEq/L   Potassium 3.0 (*) 3.7 - 5.3 mEq/L   Chloride 107  96 - 112 mEq/L   CO2 23  19 - 32 mEq/L   Glucose, Bld 166 (*) 70 - 99 mg/dL   BUN 35 (*) 6 - 23 mg/dL   Creatinine, Ser 2.05 (*) 0.50 - 1.10 mg/dL   Calcium 8.3 (*) 8.4 - 10.5 mg/dL   GFR calc non Af Amer 23 (*) >90 mL/min   GFR calc Af Amer 27 (*) >90 mL/min     1550:  [place] RLE negative for DVT. CHF on CXR and BNP elevated from previous; will dose IV lasix. Potassium repleted PO. BUN/Cr and H/H per baseline. Dx and testing d/w pt and family.  Questions answered.  Verb  understanding, agreeable to admit. T/C to [facility]. Anastasio Champion, case discussed, including:  HPI, pertinent PM/SHx, VS/PE, dx testing, ED course and treatment:  Agreeable to admit, requests to write temporary orders, obtain tele bed.        Alfonzo Feller, DO 02/22/14 1355

## 2014-02-21 NOTE — ED Notes (Signed)
Sob , onset today, with swelling of feet.  ,denies pain.

## 2014-02-21 NOTE — Progress Notes (Signed)
Clinical Summary Ms. Medellin is a 73 y.o.female seen today for hospital follow up  1. Anemia - recent admit 12/2013 with generalized weakness, Hgb ws 4.8.  - transfused 3 units of pRBCs, inpatient EGD without clear source of bleed. She was diagnosed with H.pyloria gastritis but per notes not thought to have caused this degree of anemia, awaiting outpatient colonoscopy - no repeat cbc since dischage  2. CAD - prior CABG in 2002, LVEF has been stable at 40-45% between 2013 and recent study 12/2013 - mild troponin elevated at 0.76 during recent admit thought to be demand ischemia, had been on antiplatelet at home. Held at time of discharge with plan to resume per notes if Hgb stable  3. CKD  4. Dementia  5. Leg swelling - 02/20/14 LE [place] negative for DVT Past Medical History  Diagnosis Date  . Type 2 diabetes mellitus   . Essential hypertension, benign   . History of stroke     Previously on Coumadin  . MI, old     Reported 51  . Seizure disorder   . Coronary atherosclerosis of native coronary artery     Multivessel status post CABG 2002  . History of GI bleed     Erosive gastritis and duodenitis by EGD 2002  . Mixed hyperlipidemia   . Ischemic cardiomyopathy     LVEF 40-45% March 2013  . Dementia   . CKD (chronic kidney disease) stage 4, GFR 15-29 ml/min      No Known Allergies   Current Outpatient Prescriptions  Medication Sig Dispense Refill  . AGGRENOX 25-200 MG per 12 hr capsule Take 1 capsule by mouth 2 (two) times daily.       Marland Kitchen donepezil (ARICEPT) 5 MG tablet Take 5 mg by mouth daily.      . folic acid (FOLVITE) 1 MG tablet Take 1 mg by mouth daily.      . furosemide (LASIX) 20 MG tablet Take 1 tablet (20 mg total) by mouth daily.  30 tablet  1  . glipiZIDE (GLUCOTROL) 5 MG tablet Take 0.5 tablets (2.5 mg total) by mouth daily before breakfast.  30 tablet  1  . hydrALAZINE (APRESOLINE) 50 MG tablet Take 1 tablet (50 mg total) by mouth every 8 (eight) hours.   90 tablet  0  . isosorbide mononitrate (IMDUR) 15 mg TB24 24 hr tablet Take 0.5 tablets (15 mg total) by mouth daily.  30 tablet  1  . levETIRAcetam (KEPPRA) 100 MG/ML solution Take 5 mLs by mouth 2 (two) times daily.      . metoprolol (TOPROL-XL) 100 MG 24 hr tablet Take 50 mg by mouth daily.       . pantoprazole (PROTONIX) 40 MG tablet Take 1 tablet (40 mg total) by mouth daily.  30 tablet  1  . potassium chloride (K-DUR) 10 MEQ tablet Take 1 tablet (10 mEq total) by mouth daily.  30 tablet  1  . pravastatin (PRAVACHOL) 20 MG tablet Take 20 mg by mouth daily.        . Probiotic Product (RESTORA) CAPS Take 1 capsule by mouth daily.  30 capsule  0  . sodium bicarbonate 650 MG tablet Take 650 mg by mouth 3 (three) times daily.      . Vitamin D, Ergocalciferol, (DRISDOL) 50000 UNITS CAPS Take 50,000 Units by mouth every 7 (seven) days. Takes on Tuesday       No current facility-administered medications for this visit.  Past Surgical History  Procedure Laterality Date  . Coronary artery bypass graft  July 2002    LIMA to LAD, SVG to OM1, SVG to OM 2, SVG to RCA  . Tee without cardioversion  01/28/2012    Procedure: TRANSESOPHAGEAL ECHOCARDIOGRAM (TEE);  Surgeon: Lelon Perla, MD;  Location: [facility] ENDOSCOPY;  Service: Cardiovascular;  Laterality: [name];  . Colonoscopy  2002  . Esophagogastroduodenoscopy  2002  . Esophagogastroduodenoscopy [name] 01/18/2014    WUJ:WJXBJ hiatal hernia. Abnormal gastric and duodenal bulbar mucosa of uncertain significance - status post gastric bx (chronic gastritis/H.pylori +     No Known Allergies    Family History  Problem Relation Age of Onset  . Stroke Father   . Diabetes type II Mother   . Colon cancer Neg Hx      Social History Ms. Armon reports that she has quit smoking. Her smoking use included Cigarettes. She has a 50 pack-year smoking history. She does not have any smokeless tobacco history on file. Ms. Braun reports that she does not drink  alcohol.   Review of Systems CONSTITUTIONAL: No weight loss, fever, chills, weakness or fatigue.  HEENT: Eyes: No visual loss, blurred vision, double vision or yellow sclerae.No hearing loss, sneezing, congestion, runny nose or sore throat.  SKIN: No rash or itching.  CARDIOVASCULAR:  RESPIRATORY: No shortness of breath, cough or sputum.  GASTROINTESTINAL: No anorexia, nausea, vomiting or diarrhea. No abdominal pain or blood.  GENITOURINARY: No burning on urination, no polyuria NEUROLOGICAL: No headache, dizziness, syncope, paralysis, ataxia, numbness or tingling in the extremities. No change in bowel or bladder control.  MUSCULOSKELETAL: No muscle, back pain, joint pain or stiffness.  LYMPHATICS: No enlarged nodes. No history of splenectomy.  PSYCHIATRIC: No history of depression or anxiety.  ENDOCRINOLOGIC: No reports of sweating, cold or heat intolerance. No polyuria or polydipsia.  Marland Kitchen   Physical Examination There were no vitals filed for this visit. There were no vitals filed for this visit.  Gen: resting comfortably, no acute distress HEENT: no scleral icterus, pupils equal round and reactive, no palptable cervical adenopathy,  CV Resp: Clear to auscultation bilaterally GI: abdomen is soft, non-tender, non-distended, normal bowel sounds, no hepatosplenomegaly MSK: extremities are warm, no edema.  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

## 2014-02-21 NOTE — H&P (Signed)
[facility] History and Physical  [name] FTD:[id number] DOB: 1941/09/16 DOA: 02/21/2014  Referring physician: Dr. Thurnell Garbe, ER. PCP: [name], MD   Chief Complaint: Dyspnea, bilateral leg swelling.  HPI: [name] is a 73 y.o. female  This 73 year old lady, who has mild to moderate dementia, presents with a 2 to three-day history of dyspnea associated with bilateral leg swelling. The patient is known to have systolic congestive heart failure and was due to see cardiology today but the appointment was missed. She denies any chest pain. Evaluation in the emergency room shows that she is in congestive heart failure and is now being admitted for further treatment. There is no cough, or fever.   Review of Systems:  Constitutional:  No weight loss, night sweats, Fevers, chills, fatigue.  HEENT:  No headaches, Difficulty swallowing,Tooth/dental problems,Sore throat,  No sneezing, itching, ear ache, nasal congestion, post nasal drip,   GI:  No heartburn, indigestion, abdominal pain, nausea, vomiting, diarrhea, change in bowel habits, loss of appetite  Resp:  No shortness of breath with exertion or at rest. No excess mucus, no productive cough, No non-productive cough, No coughing up of blood.No change in color of mucus.No wheezing.No chest wall deformity  Skin:  no rash or lesions.  GU:  no dysuria, change in color of urine, no urgency or frequency. No flank pain.  Musculoskeletal:  No joint pain or swelling. No decreased range of motion. No back pain.  Psych:  No change in mood or affect. No depression or anxiety. No memory loss.   Past Medical History  Diagnosis Date  . Type 2 diabetes mellitus   . Essential hypertension, benign   . History of stroke     Previously on Coumadin  . MI, old     Reported 74  . Seizure disorder   . Coronary atherosclerosis of native coronary artery     Multivessel status post CABG 2002  . History of GI bleed     Erosive  gastritis and duodenitis by EGD 2002  . Mixed hyperlipidemia   . Ischemic cardiomyopathy     LVEF 40-45% March 2013  . Dementia   . CKD (chronic kidney disease) stage 4, GFR 15-29 ml/min   . Stroke   . Seizures    Past Surgical History  Procedure Laterality Date  . Coronary artery bypass graft  July 2002    LIMA to LAD, SVG to OM1, SVG to OM 2, SVG to RCA  . Tee without cardioversion  01/28/2012    Procedure: TRANSESOPHAGEAL ECHOCARDIOGRAM (TEE);  Surgeon: Lelon Perla, MD;  Location: [facility] ENDOSCOPY;  Service: Cardiovascular;  Laterality: [name];  . Colonoscopy  2002  . Esophagogastroduodenoscopy  2002  . Esophagogastroduodenoscopy [name] 01/18/2014    CWC:BJSEG hiatal hernia. Abnormal gastric and duodenal bulbar mucosa of uncertain significance - status post gastric bx (chronic gastritis/H.pylori +   Social History:  reports that she has quit smoking. Her smoking use included Cigarettes. She has a 50 pack-year smoking history. She does not have any smokeless tobacco history on file. She reports that she does not drink alcohol or use illicit drugs.  No Known Allergies  Family History  Problem Relation Age of Onset  . Stroke Father   . Diabetes type II Mother   . Colon cancer Neg Hx      Prior to Admission medications   Medication Sig Start Date End Date Taking? Authorizing Provider  AGGRENOX 25-200 MG per 12 hr capsule Take 1 capsule by  mouth 2 (two) times daily.  01/30/14  Yes Historical Provider, MD  donepezil (ARICEPT) 5 MG tablet Take 5 mg by mouth daily.   Yes Historical Provider, MD  folic acid (FOLVITE) 1 MG tablet Take 1 mg by mouth daily.   Yes Historical Provider, MD  furosemide (LASIX) 20 MG tablet Take 1 tablet (20 mg total) by mouth daily. 07/25/13  Yes Kathie Dike, MD  glipiZIDE (GLUCOTROL) 5 MG tablet Take 0.5 tablets (2.5 mg total) by mouth daily before breakfast. 01/19/14  Yes Kathie Dike, MD  hydrALAZINE (APRESOLINE) 50 MG tablet Take 1 tablet (50 mg total) by mouth  every 8 (eight) hours. 12/23/12  Yes Lezlie Octave Black, NP  isosorbide mononitrate (IMDUR) 15 mg TB24 24 hr tablet Take 0.5 tablets (15 mg total) by mouth daily. 01/19/14  Yes Kathie Dike, MD  levETIRAcetam (KEPPRA) 100 MG/ML solution Take 5 mLs by mouth 2 (two) times daily. 01/13/14  Yes Historical Provider, MD  pantoprazole (PROTONIX) 40 MG tablet Take 1 tablet (40 mg total) by mouth daily. 01/19/14  Yes Kathie Dike, MD  potassium chloride (K-DUR) 10 MEQ tablet Take 1 tablet (10 mEq total) by mouth daily. 07/25/13  Yes Kathie Dike, MD  pravastatin (PRAVACHOL) 20 MG tablet Take 20 mg by mouth daily.     Yes Historical Provider, MD  Probiotic Product (RESTORA) CAPS Take 1 capsule by mouth daily. 02/20/14  Yes Mahala Menghini, PA-C  sodium bicarbonate 650 MG tablet Take 650 mg by mouth 3 (three) times daily.   Yes Historical Provider, MD  Vitamin D, Ergocalciferol, (DRISDOL) 50000 UNITS CAPS Take 50,000 Units by mouth every 7 (seven) days. Takes on Tuesday   Yes Historical Provider, MD   Physical Exam: Filed Vitals:   02/21/14 1429  BP: 172/89  Pulse: 101  Temp: 98.7 F (37.1 C)  Resp: 21    BP 172/89  Pulse 101  Temp(Src) 98.7 F (37.1 C) (Oral)  Resp 21  SpO2 97%  General:  Appears calm and comfortable. Eyes: PERRL, normal lids, irises & conjunctiva ENT: grossly normal hearing, lips & tongue Neck: no LAD, masses or thyromegaly Cardiovascular: RRR, no m/r/g. Bilateral peripheral pitting edema in her legs. Telemetry: SR, no arrhythmias  Respiratory: Lung fields show inspiratory crackles in the bases bilaterally. Normal respiratory effort. Abdomen: soft, ntnd Skin: no rash or induration seen on limited exam Musculoskeletal: grossly normal tone BUE/BLE  Neurologic: grossly non-focal.appears to be alert, does not appear to be orientated and I suspect this is her baseline dementia.           Labs on Admission:  Basic Metabolic Panel:  Recent Labs Lab 02/21/14 1230  02/21/14 1445  NA SAMPLE HEMOLYSED TO BE RECOLLECTED 143  K  --  3.0*  CL  --  107  CO2  --  23  GLUCOSE  --  166*  BUN  --  35*  CREATININE  --  2.05*  CALCIUM  --  8.3*  MG 2.1  --       CBC:  Recent Labs Lab 02/21/14 1230  WBC 4.2  NEUTROABS 3.0  HGB 8.7*  HCT 27.6*  MCV 85.7  PLT 268   Cardiac Enzymes:  Recent Labs Lab 02/21/14 1230  TROPONINI <0.30    BNP (last 3 results)  Recent Labs  07/22/13 1420 02/21/14 1230  PROBNP 27695.0* 35849.0*   CBG: No results found for this basename: GLUCAP,  in the last 168 hours  Radiological Exams on Admission: US Venous  Img Lower Unilateral Right  02/20/2014   CLINICAL DATA:  Right leg swelling  EXAM: Right LOWER EXTREMITY VENOUS DOPPLER ULTRASOUND  TECHNIQUE: Gray-scale sonography with graded compression, as well as color Doppler and duplex ultrasound, were performed to evaluate the deep venous system from the level of the common femoral vein through the popliteal and proximal calf veins. Spectral Doppler was utilized to evaluate flow at rest and with distal augmentation maneuvers.  COMPARISON:  None.  FINDINGS: Thrombus within deep veins:  None visualized.  Compressibility of deep veins:  Normal.  Duplex waveform respiratory phasicity:  Normal.  Duplex waveform response to augmentation:  Normal.  Venous reflux:  None visualized.  Other findings:  Diffuse calf edema is noted.  IMPRESSION: No evidence of deep venous thrombosis is noted.   Electronically Signed   By: Inez Catalina M.D.   On: 02/20/2014 12:01   Dg Chest Portable 1 View  02/21/2014   CLINICAL DATA:  Shortness of breath since this morning  EXAM: PORTABLE CHEST - 1 VIEW  COMPARISON:  DG CHEST 1V PORT dated 01/19/2014; DG CHEST 2 VIEW dated 07/22/2013; DG CHEST 1 VIEW dated 09/03/2012  FINDINGS: Stable mild to moderate cardiac enlargement in this patient who is status post CABG. There is vascular congestion. There is moderately severe interstitial prominence.   IMPRESSION: Cardiogenic pulmonary edema   Electronically Signed   By: Skipper Cliche M.D.   On: 02/21/2014 13:08     Assessment/Plan Active Problems:   CHF, acute on chronic   Diabetes mellitus   CKD (chronic kidney disease) stage 3, GFR 30-59 ml/min   Dementia   CHF (congestive heart failure)   1. Acute systolic congestive heart failure. 2. Chronic kidney disease stage III. 3. Type 2 diabetes mellitus. 4. Mild to moderate dementia. Likely vascular. 5. History of cerebral vascular disease. 6. Hypokalemia.  Plan: 1. Admit to telemetry floor. 2. Intravenous Lasix for diuresis. Daily weights. 3. Cardiology consultation. 4. Replete potassium.  Further recommendations will depend on patient's hospital progress.  Code Status: DO NOT RESUSCITATE. This was confirmed by family members present, especially her daughter who is the Marine scientist.  Family Communication: Discussed plan with patient's daughter is at the bedside.   Disposition Plan: Home when medically stable.   Time spent: 45 minutes.  Doree Albee [facility] Pager [phone].

## 2014-02-22 DIAGNOSIS — N189 Chronic kidney disease, unspecified: Secondary | ICD-10-CM

## 2014-02-22 DIAGNOSIS — I5043 Acute on chronic combined systolic (congestive) and diastolic (congestive) heart failure: Secondary | ICD-10-CM

## 2014-02-22 DIAGNOSIS — N179 Acute kidney failure, unspecified: Secondary | ICD-10-CM

## 2014-02-22 DIAGNOSIS — I340 Nonrheumatic mitral (valve) insufficiency: Secondary | ICD-10-CM

## 2014-02-22 LAB — COMPREHENSIVE METABOLIC PANEL
ALBUMIN: 2.4 g/dL — AB (ref 3.5–5.2)
ALT: 26 U/L (ref 0–35)
AST: 32 U/L (ref 0–37)
Alkaline Phosphatase: 73 U/L (ref 39–117)
BUN: 33 mg/dL — AB (ref 6–23)
CO2: 23 mEq/L (ref 19–32)
Calcium: 8.4 mg/dL (ref 8.4–10.5)
Chloride: 108 mEq/L (ref 96–112)
Creatinine, Ser: 1.99 mg/dL — ABNORMAL HIGH (ref 0.50–1.10)
GFR calc non Af Amer: 24 mL/min — ABNORMAL LOW (ref >90)
GFR, EST AFRICAN AMERICAN: 27 mL/min — AB (ref >90)
GLUCOSE: 141 mg/dL — AB (ref 70–99)
Potassium: 3.1 mEq/L — ABNORMAL LOW (ref 3.7–5.3)
Sodium: 144 mEq/L (ref 137–147)
TOTAL PROTEIN: 7.5 g/dL (ref 6.0–8.3)
Total Bilirubin: 0.2 mg/dL — ABNORMAL LOW (ref 0.3–1.2)

## 2014-02-22 LAB — GLUCOSE, CAPILLARY
GLUCOSE-CAPILLARY: 182 mg/dL — AB (ref 70–99)
Glucose-Capillary: 118 mg/dL — ABNORMAL HIGH (ref 70–99)
Glucose-Capillary: 208 mg/dL — ABNORMAL HIGH (ref 70–99)
Glucose-Capillary: 201 mg/dL — ABNORMAL HIGH (ref 70–99)

## 2014-02-22 LAB — CBC
HCT: 26.4 % — ABNORMAL LOW (ref 36.0–46.0)
Hemoglobin: 8.4 g/dL — ABNORMAL LOW (ref 12.0–15.0)
MCH: 26.9 pg (ref 26.0–34.0)
MCHC: 31.8 g/dL (ref 30.0–36.0)
MCV: 84.6 fL (ref 78.0–100.0)
PLATELETS: 244 10*3/uL (ref 150–400)
RBC: 3.12 MIL/uL — AB (ref 3.87–5.11)
RDW: 17.1 % — ABNORMAL HIGH (ref 11.5–15.5)
WBC: 3.8 10*3/uL — ABNORMAL LOW (ref 4.0–10.5)

## 2014-02-22 LAB — CLOSTRIDIUM DIFFICILE BY PCR: Toxigenic C. Difficile by PCR: NOT DETECTED

## 2014-02-22 LAB — PRO B NATRIURETIC PEPTIDE: PRO B NATRI PEPTIDE: 38775 pg/mL — AB (ref 0–125)

## 2014-02-22 LAB — TROPONIN I: Troponin I: <0.3 ng/mL (ref <0.30)

## 2014-02-22 MED ORDER — POTASSIUM CHLORIDE 20 MEQ/15ML (10%) PO LIQD
40.0000 meq | Freq: Two times a day (BID) | ORAL | Status: DC
  Administered 2014-02-22 – 2014-02-25 (×7): 40 meq via ORAL
  Filled 2014-02-22 (×7): qty 30

## 2014-02-22 MED ORDER — METOPROLOL SUCCINATE ER 25 MG PO TB24
25.0000 mg | ORAL_TABLET | Freq: Every day | ORAL | Status: DC
  Administered 2014-02-22: 25 mg via ORAL
  Filled 2014-02-22: qty 1

## 2014-02-22 NOTE — Care Management Note (Signed)
    Page 1 of 1   02/22/2014     3:15:34 PM   CARE MANAGEMENT NOTE 02/22/2014  Patient:  [name], [name]   Account Number:  [id number]  Date Initiated:  02/22/2014  Documentation initiated by:  Claretha Cooper  Subjective/Objective Assessment:   Pt lives at home with daughter. Currently active with [facility] RN. States she does not need additional services or equipment.     Action/Plan:   Anticipated DC Date:     Anticipated DC Plan:  [place]  CM consult      [facility] Choice  Resumption Of Svcs/PTA Provider   Choice offered to / List presented to:  [name]        [place] arranged  HH-1 RN      [place].   Status of service:  Completed, signed off Medicare Important Message given?   (If response is "NO", the following Medicare IM given date fields will be blank) Date Medicare IM given:   Date Additional Medicare IM given:    Discharge Disposition:    Per UR Regulation:    If discussed at Long Length of Stay Meetings, dates discussed:    Comments:  02/22/14 Claretha Cooper RN BSN CM

## 2014-02-22 NOTE — Consult Note (Signed)
Primary cardiologist: Dr. Carlyle Dolly MD Consulting cardiologist: Dr. Carlyle Dolly MD  Clinical Summary [name] is a 73 y.o.female history of CAD with prior CABG in 2002, LVEF 40-45% by recent echo 12/2013, CKD, dementia, and recent admission 12/2013 with severe anemia at 4.8 requiring 3 units of pRBCs. Diagnosed with H.pylori gastritis but per notes felt unlikely cause of bleed. She also had a mild troponin leak at that time of 0.76 thought to be demand ischemia.  She is admitted with shortness of breath that has progressed x 2 days with increased LE edema (R>L). No chest pain or palpitations. Her daugheter reports sodium limitation and medication compliance.   CXR with pulmonary edema, pro-BNP 35849, trop neg x 4, Hgb 8.7, Cr 2.05 (baseline around 2.1-2.2), K 3, GFR 27. EKG sinus rhtyhm with no specific ischemic changes. Recent US LE's negative for DVT.    No Known Allergies  Medications Scheduled Medications: . acidophilus  1 capsule Oral Daily  . dipyridamole-aspirin  1 capsule Oral BID  . donepezil  5 mg Oral QHS  . folic acid  1 mg Oral Daily  . furosemide  60 mg Intravenous Q12H  . glipiZIDE  2.5 mg Oral QAC breakfast  . heparin  5,000 Units Subcutaneous 3 times per day  . hydrALAZINE  50 mg Oral 3 times per day  . insulin aspart  0-5 Units Subcutaneous QHS  . insulin aspart  0-9 Units Subcutaneous TID WC  . isosorbide mononitrate  15 mg Oral Daily  . levETIRAcetam  500 mg Oral BID  . pantoprazole  40 mg Oral Daily  . simvastatin  10 mg Oral q1800  . sodium bicarbonate  650 mg Oral TID  . sodium chloride  3 mL Intravenous Q12H  . [START ON 02/28/2014] Vitamin D (Ergocalciferol)  50,000 Units Oral Q7 days     Infusions:     PRN Medications:  ondansetron (ZOFRAN) IV, ondansetron   Past Medical History  Diagnosis Date  . Type 2 diabetes mellitus   . Essential hypertension, benign   . History of stroke     Previously on Coumadin  . MI, old    Reported 73  . Seizure disorder   . Coronary atherosclerosis of native coronary artery     Multivessel status post CABG 2002  . History of GI bleed     Erosive gastritis and duodenitis by EGD 2002  . Mixed hyperlipidemia   . Ischemic cardiomyopathy     LVEF 40-45% March 2013  . Dementia   . CKD (chronic kidney disease) stage 4, GFR 15-29 ml/min   . Stroke   . Seizures     Past Surgical History  Procedure Laterality Date  . Coronary artery bypass graft  July 2002    LIMA to LAD, SVG to OM1, SVG to OM 2, SVG to RCA  . Tee without cardioversion  01/28/2012    Procedure: TRANSESOPHAGEAL ECHOCARDIOGRAM (TEE);  Surgeon: Lelon Perla, MD;  Location: [facility] ENDOSCOPY;  Service: Cardiovascular;  Laterality: [name];  . Colonoscopy  2002  . Esophagogastroduodenoscopy  2002  . Esophagogastroduodenoscopy [name] 01/18/2014    SFK:CLEXN hiatal hernia. Abnormal gastric and duodenal bulbar mucosa of uncertain significance - status post gastric bx (chronic gastritis/H.pylori +    Family History  Problem Relation Age of Onset  . Stroke Father   . Diabetes type II Mother   . Colon cancer Neg Hx     Social History Ms. Chanthavong reports that she has quit smoking.  Her smoking use included Cigarettes. She has a 50 pack-year smoking history. She does not have any smokeless tobacco history on file. Ms. Falor reports that she does not drink alcohol.  Review of Systems CONSTITUTIONAL: No weight loss, fever, chills, weakness or fatigue.  HEENT: Eyes: No visual loss, blurred vision, double vision or yellow sclerae. No hearing loss, sneezing, congestion, runny nose or sore throat.  SKIN: No rash or itching.  CARDIOVASCULAR: per HPI RESPIRATORY: per HPI GASTROINTESTINAL: No anorexia, nausea, vomiting or diarrhea. No abdominal pain or blood.  GENITOURINARY: no polyuria, no dysuria NEUROLOGICAL: No headache, dizziness, syncope, paralysis, ataxia, numbness or tingling in the extremities. No change in bowel or  bladder control.  MUSCULOSKELETAL: leg swelling HEMATOLOGIC: No anemia, bleeding or bruising.  LYMPHATICS: No enlarged nodes. No history of splenectomy.  PSYCHIATRIC: No history of depression or anxiety.      Physical Examination Blood pressure 147/76, pulse 85, temperature 97.9 F (36.6 C), temperature source Oral, resp. rate 20, height 5\' 5"  (1.651 m), weight 156 lb 11.2 oz (71.079 kg), SpO2 95.00%.  Intake/Output Summary (Last 24 hours) at 02/22/14 0819 Last data filed at 02/21/14 1811  Gross per 24 hour  Intake    120 ml  Output      0 ml  Net    120 ml    Cardiovascular: RRR, 3/6 systolic murmur at apex, no JVD, no carotid bruits  Respiratory: faint crackles bilateral bases  GI: soft, NT, ND  MSK: 2+ bilateral LE edema R>L  Neuro: no focal deficits  Psych: appropriate affect   Lab Results  Basic Metabolic Panel:  Recent Labs Lab 02/21/14 1230 02/21/14 1445 02/22/14 0226  NA SAMPLE HEMOLYSED TO BE RECOLLECTED 143 144  K  --  3.0* 3.1*  CL  --  107 108  CO2  --  23 23  GLUCOSE  --  166* 141*  BUN  --  35* 33*  CREATININE  --  2.05* 1.99*  CALCIUM  --  8.3* 8.4  MG 2.1  --   --     Liver Function Tests:  Recent Labs Lab 02/22/14 0226  AST 32  ALT 26  ALKPHOS 73  BILITOT 0.2*  PROT 7.5  ALBUMIN 2.4*    CBC:  Recent Labs Lab 02/21/14 1230 02/22/14 0226  WBC 4.2 3.8*  NEUTROABS 3.0  --   HGB 8.7* 8.4*  HCT 27.6* 26.4*  MCV 85.7 84.6  PLT 268 244    Cardiac Enzymes:  Recent Labs Lab 02/21/14 1230 02/21/14 1700 02/21/14 1952 02/22/14 0226  TROPONINI <0.30 <0.30 <0.30 <0.30    BNP: No components found with this basename: POCBNP,    ECG NSR, PVC  Impression/Recommendations 1. 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 She was previously on Toprol XL, unclear when this medication fell off her list. Will restart low dose Toprol XL with plans for further titration as outpatient.   2. CAD  - prior CABG in 2002, LVEF has been stable at 40-45% between 2013 and recent study 12/2013  - mild troponin elevated at 0.76 during prior admit thought to be demand ischemia in setting of severe anemia - troponins negative, EKG without evidence of ACS. Denies any chest pain - antiplatelet agents were stopped last visit in setting of anemia, per discharge  plans to resume if Hgb remained stable. Discharge Hgb 01/19/14 was 9.3, admission Hgb 8.7 showing mild downtrend. She is on aggrenox however the component of ASA is 81mg  that would be needed for secondary cardiac prevention. Continue to follow H&H, consider adding additional 81mg  of ASA if stabilizes.   3. Severe MR - functional MR by echo 12/2013, poor candidate at this time for consideration for surgery due to overall comorbidities including dementia.  - continue symptom control with diuretics.   Carlyle Dolly, M.D., F.A.C.C.

## 2014-02-22 NOTE — Progress Notes (Addendum)
[facility] PROGRESS NOTE  [name] ALP:[id number] DOB: 04/14/1941 DOA: 02/21/2014 PCP: [name], MD  Assessment/Plan: 1. Acute on chronic combined systolic/diasotlic heart failure  - continue IV lasix 60mg  Q12 - strict I/Os, weights - no ACE/ARB due to CKD - continue hydralazine and imdur for afterload reduction - EF 40% on ECHo 2/15 - replace K  2. CAD  - h/o  CABG in 2002 - cardiac enzymes negative, EKG stable  - now on aggrenox, start BB if ok with cards  3. CKD 4 -stable, on sodium bicarb  4. DM -stable, glipizide, SSI  5. Dementia -stable, continue aricept  6. H/o CVA -on aggrenox  7. Anemia -chronic stable, recent GI bleeding 2/15 - no clear source found then, supposed to have colonoscopy down the road per GI  DVT proph: hep SQ  Code Status: DNR Family Communication: no family at bedside Disposition Plan: home when improved   Consultants:  Cards  HPI/Subjective: Feeling better, no complaints  Objective: Filed Vitals:   02/22/14 1114  BP: 144/54  Pulse: 85  Temp:   Resp:     Intake/Output Summary (Last 24 hours) at 02/22/14 1310 Last data filed at 02/21/14 1811  Gross per 24 hour  Intake    120 ml  Output      0 ml  Net    120 ml   Filed Weights   02/21/14 1703 02/22/14 0549  Weight: 72.485 kg (159 lb 12.8 oz) 71.079 kg (156 lb 11.2 oz)    Exam:   General:  AAOx to self and place only, no distress  Cardiovascular: S1S2/RRR  Respiratory: diminished at bases  Abdomen: soft, Nt, BS present  Musculoskeletal: 1plus edema   Data Reviewed: Basic Metabolic Panel:  Recent Labs Lab 02/21/14 1230 02/21/14 1445 02/22/14 0226  NA SAMPLE HEMOLYSED TO BE RECOLLECTED 143 144  K  --  3.0* 3.1*  CL  --  107 108  CO2  --  23 23  GLUCOSE  --  166* 141*  BUN  --  35* 33*  CREATININE  --  2.05* 1.99*  CALCIUM  --  8.3* 8.4  MG 2.1  --   --    Liver Function Tests:  Recent Labs Lab 02/22/14 0226  AST 32  ALT 26   ALKPHOS 73  BILITOT 0.2*  PROT 7.5  ALBUMIN 2.4*   No results found for this basename: LIPASE, AMYLASE,  in the last 168 hours No results found for this basename: AMMONIA,  in the last 168 hours CBC:  Recent Labs Lab 02/21/14 1230 02/22/14 0226  WBC 4.2 3.8*  NEUTROABS 3.0  --   HGB 8.7* 8.4*  HCT 27.6* 26.4*  MCV 85.7 84.6  PLT 268 244   Cardiac Enzymes:  Recent Labs Lab 02/21/14 1230 02/21/14 1700 02/21/14 1952 02/22/14 0226  TROPONINI <0.30 <0.30 <0.30 <0.30   BNP (last 3 results)  Recent Labs  07/22/13 1420 02/21/14 1230 02/22/14 0226  PROBNP 27695.0* 35849.0* 38775.0*   CBG:  Recent Labs Lab 02/21/14 1713 02/21/14 2053 02/21/14 2149 02/22/14 0738 02/22/14 1129  GLUCAP 133* 185* 178* 118* 208*    Recent Results (from the past 240 hour(s))  CLOSTRIDIUM DIFFICILE BY PCR     Status: None   Collection Time    02/20/14  4:30 AM      Result Value Ref Range Status   C difficile by pcr Not Detected  Not Detected Final   Comment:  This assay detects the presence of Clostridium difficile DNA coding     for toxin B (tcdB) by real-time polymerase chain reaction (PCR)     amplification.     This test was developed and its performance characteristics have been     determined by [employer]. Performance characteristics refer     to the analytical performance of the test. This test has not been     cleared or approved by the [place] Food and Drug Administration. The FDA     has determined that such clearance or approval is not necessary. This     laboratory is certified under the [place] as qualified to perform high complexity clinical     laboratory testing.     Studies: Dg Chest Portable 1 View  02/21/2014   CLINICAL DATA:  Shortness of breath since this morning  EXAM: PORTABLE CHEST - 1 VIEW  COMPARISON:  DG CHEST 1V PORT dated 01/19/2014; DG CHEST 2 VIEW dated 07/22/2013; DG CHEST 1 VIEW dated  09/03/2012  FINDINGS: Stable mild to moderate cardiac enlargement in this patient who is status post CABG. There is vascular congestion. There is moderately severe interstitial prominence.  IMPRESSION: Cardiogenic pulmonary edema   Electronically Signed   By: Skipper Cliche M.D.   On: 02/21/2014 13:08    Scheduled Meds: . acidophilus  1 capsule Oral Daily  . dipyridamole-aspirin  1 capsule Oral BID  . donepezil  5 mg Oral QHS  . folic acid  1 mg Oral Daily  . furosemide  60 mg Intravenous Q12H  . glipiZIDE  2.5 mg Oral QAC breakfast  . heparin  5,000 Units Subcutaneous 3 times per day  . hydrALAZINE  50 mg Oral 3 times per day  . insulin aspart  0-5 Units Subcutaneous QHS  . insulin aspart  0-9 Units Subcutaneous TID WC  . isosorbide mononitrate  15 mg Oral Daily  . levETIRAcetam  500 mg Oral BID  . metoprolol succinate  25 mg Oral Daily  . pantoprazole  40 mg Oral Daily  . simvastatin  10 mg Oral q1800  . sodium bicarbonate  650 mg Oral TID  . sodium chloride  3 mL Intravenous Q12H  . [START ON 02/28/2014] Vitamin D (Ergocalciferol)  50,000 Units Oral Q7 days   Continuous Infusions:  Antibiotics Given (last 72 hours)   None      Active Problems:   Diabetes mellitus   CKD (chronic kidney disease) stage 3, GFR 30-59 ml/min   CHF, acute on chronic   Dementia   CHF (congestive heart failure)   Severe mitral regurgitation   Acute on chronic combined systolic and diastolic congestive heart failure    Time spent: 27min    Analysa Nutting  [facility] Pager [phone]. If 7PM-7AM, please contact night-coverage at [email or web address], password [facility] 02/22/2014, 1:10 PM  LOS: 1 day

## 2014-02-23 LAB — GLUCOSE, CAPILLARY
GLUCOSE-CAPILLARY: 185 mg/dL — AB (ref 70–99)
Glucose-Capillary: 162 mg/dL — ABNORMAL HIGH (ref 70–99)
Glucose-Capillary: 210 mg/dL — ABNORMAL HIGH (ref 70–99)
Glucose-Capillary: 97 mg/dL (ref 70–99)

## 2014-02-23 LAB — CBC
HCT: 26.5 % — ABNORMAL LOW (ref 36.0–46.0)
Hemoglobin: 8.3 g/dL — ABNORMAL LOW (ref 12.0–15.0)
MCH: 26.9 pg (ref 26.0–34.0)
MCHC: 31.3 g/dL (ref 30.0–36.0)
MCV: 85.8 fL (ref 78.0–100.0)
PLATELETS: 241 10*3/uL (ref 150–400)
RBC: 3.09 MIL/uL — ABNORMAL LOW (ref 3.87–5.11)
RDW: 17.5 % — AB (ref 11.5–15.5)
WBC: 3.6 10*3/uL — AB (ref 4.0–10.5)

## 2014-02-23 LAB — BASIC METABOLIC PANEL
BUN: 32 mg/dL — ABNORMAL HIGH (ref 6–23)
CALCIUM: 8.6 mg/dL (ref 8.4–10.5)
CO2: 26 mEq/L (ref 19–32)
CREATININE: 2.17 mg/dL — AB (ref 0.50–1.10)
Chloride: 109 mEq/L (ref 96–112)
GFR calc non Af Amer: 21 mL/min — ABNORMAL LOW (ref >90)
GFR, EST AFRICAN AMERICAN: 25 mL/min — AB (ref >90)
Glucose, Bld: 176 mg/dL — ABNORMAL HIGH (ref 70–99)
Potassium: 4.1 mEq/L (ref 3.7–5.3)
Sodium: 145 mEq/L (ref 137–147)

## 2014-02-23 MED ORDER — METOPROLOL SUCCINATE ER 50 MG PO TB24
50.0000 mg | ORAL_TABLET | Freq: Every day | ORAL | Status: DC
  Administered 2014-02-23: 50 mg via ORAL
  Filled 2014-02-23: qty 1

## 2014-02-23 MED ORDER — HYDRALAZINE HCL 25 MG PO TABS
75.0000 mg | ORAL_TABLET | Freq: Three times a day (TID) | ORAL | Status: DC
  Administered 2014-02-23 – 2014-02-25 (×6): 75 mg via ORAL
  Filled 2014-02-23 (×7): qty 3

## 2014-02-23 NOTE — Progress Notes (Signed)
Patient ID: [name], female   DOB: Jan 03, 1941, 73 y.o.   MRN: [id number]      Subjective:    SOB improving, still not at baseline.   Objective:   Temp:  [97.2 F (36.2 C)-97.9 F (36.6 C)] 97.5 F (36.4 C) (04/02 0500) Pulse Rate:  [77-90] 85 (04/02 0500) Resp:  [18-20] 20 (04/02 0500) BP: (118-173)/(54-87) 163/87 mmHg (04/02 0500) SpO2:  [97 %-100 %] 100 % (04/02 0500) Weight:  [154 lb 3.2 oz (69.945 kg)] 154 lb 3.2 oz (69.945 kg) (04/02 0500) Last BM Date: 02/21/14  Filed Weights   02/21/14 1703 02/22/14 0549 02/23/14 0500  Weight: 159 lb 12.8 oz (72.485 kg) 156 lb 11.2 oz (71.079 kg) 154 lb 3.2 oz (69.945 kg)    Intake/Output Summary (Last 24 hours) at 02/23/14 0843 Last data filed at 02/23/14 0323  Gross per 24 hour  Intake    480 ml  Output   1000 ml  Net   -520 ml    Telemetry:Sinus rhythm, PACs, PVCs  Exam:  General: NAD  Resp: expiratory wheeze  Cardiac: RRR, 3/6 systolic murmur at apex  GI: abdomen soft, NT, ND  MSK: 1+ bilateral LE edema  Neuro: no focal deficits   Lab Results:  Basic Metabolic Panel:  Recent Labs Lab 02/21/14 1230 02/21/14 1445 02/22/14 0226 02/23/14 0609  NA SAMPLE HEMOLYSED TO BE RECOLLECTED 143 144 145  K  --  3.0* 3.1* 4.1  CL  --  107 108 109  CO2  --  23 23 26   GLUCOSE  --  166* 141* 176*  BUN  --  35* 33* 32*  CREATININE  --  2.05* 1.99* 2.17*  CALCIUM  --  8.3* 8.4 8.6  MG 2.1  --   --   --     Liver Function Tests:  Recent Labs Lab 02/22/14 0226  AST 32  ALT 26  ALKPHOS 73  BILITOT 0.2*  PROT 7.5  ALBUMIN 2.4*    CBC:  Recent Labs Lab 02/21/14 1230 02/22/14 0226 02/23/14 0609  WBC 4.2 3.8* 3.6*  HGB 8.7* 8.4* 8.3*  HCT 27.6* 26.4* 26.5*  MCV 85.7 84.6 85.8  PLT 268 244 241    Cardiac Enzymes:  Recent Labs Lab 02/21/14 1700 02/21/14 1952 02/22/14 0226  TROPONINI <0.30 <0.30 <0.30    BNP:  Recent Labs  07/22/13 1420 02/21/14 1230 02/22/14 0226  PROBNP 27695.0*  35849.0* 38775.0*    Coagulation: No results found for this basename: INR,  in the last 168 hours  ECG:   Medications:   Scheduled Medications: . acidophilus  1 capsule Oral Daily  . dipyridamole-aspirin  1 capsule Oral BID  . donepezil  5 mg Oral QHS  . folic acid  1 mg Oral Daily  . furosemide  60 mg Intravenous Q12H  . glipiZIDE  2.5 mg Oral QAC breakfast  . heparin  5,000 Units Subcutaneous 3 times per day  . hydrALAZINE  50 mg Oral 3 times per day  . insulin aspart  0-5 Units Subcutaneous QHS  . insulin aspart  0-9 Units Subcutaneous TID WC  . isosorbide mononitrate  15 mg Oral Daily  . levETIRAcetam  500 mg Oral BID  . metoprolol succinate  25 mg Oral Daily  . pantoprazole  40 mg Oral Daily  . potassium chloride  40 mEq Oral BID  . simvastatin  10 mg Oral q1800  . sodium bicarbonate  650 mg Oral TID  . sodium chloride  3 mL Intravenous Q12H  . [START ON 02/28/2014] Vitamin D (Ergocalciferol)  50,000 Units Oral Q7 days     Infusions:     PRN Medications:  ondansetron (ZOFRAN) IV, ondansetron     Assessment/Plan   1. 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 Mild upward trend in Cr. She is currently on lasix 60mg  IV bid. Still with evidence of volume overload on exam.  - continue IV diuretics today, strict I/Os  - continue hydral/imdur in the setting of systolic dysfunction, no ACE/ARB or spironolactone due to chronic renal dysfunction. - started low dose Toprol XL yesterday, bump up to 50mg  today with further titration as outpatient.  - elevated bp's, will increase hydral to 75mg  tid for further afterload reduction  2. CAD  - prior CABG in 2002, LVEF has been stable at 40-45% between 2013 and recent study 12/2013  - mild troponin elevated at 0.76 during prior admit thought to be demand ischemia in setting of severe  anemia  - this admission troponins negative, EKG without evidence of ACS. Denies any chest pain  - antiplatelet agents were stopped last admission in setting of anemia, per discharge plans to resume if Hgb remained stable. Discharge Hgb 01/19/14 was 9.3, admission Hgb 8.7 showing mild downtrend. She is on aggrenox however the component of ASA is 81mg  that would be needed for secondary cardiac prevention. Continue to follow H&H, consider adding additional 81mg  of ASA if Hgb stabilizes.   3. Severe MR  - functional MR by echo 12/2013, poor candidate at this time for consideration for surgery due to overall comorbidities including dementia.  - continue symptom control with diuretics.  - increase afterload reduction with increased hydral dose  4. HTN - elevated bp, have increased both Toprol and hydral today - in setting of systolic/diastolic dysfunction and MR, goal is to aggressively afterload reduce, continue to work toward good bp control       Carlyle Dolly, M.D., F.A.C.C.

## 2014-02-23 NOTE — Progress Notes (Signed)
PROGRESS NOTE  [name] DQQ:[id number] DOB: 04/29/1941 DOA: 02/21/2014 PCP: [name], MD  Summary: 73 year old woman present with bilateral lower extremity edema increasing shortness of breath. Admitted for acute on chronic systolic congestive heart failure.  Assessment/Plan: 1. 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 Slowly improving. LVEF 40-45% by echocardiogram 12/2013. No ACE/ARB or spironolactone due to chronic renal dysfunction 2. CKD stage III. Stable. 3. H/o anemia, thought to be iron deficiency. Stable. 4. H/o NSTEMI 12/2013 secondary to demand ischemia from severe anemia. Stable. 5. Severe mitral regurgitation. Poor candidate for surgery per cardiology. Continue symptom control with diuretics. 6. DM type 2. Stable. 7. Dementia. Stable. Continue Aricept. 8. Seizure disorder . Stable. Continue Keppra. 9. History of CABG 2002   Slowly improving. Continue diuresis per cardiology. Add low-dose aspirin 4/3.  Code Status: DNR DVT prophylaxis: heparin Family Communication: discussed with sister at bedside Disposition Plan: home when improved  [name], MD  [facility]  Pager [phone] If 7PM-7AM, please contact night-coverage at [email or web address], password [facility] 02/23/2014, 12:44 PM  LOS: 2 days   Consultants:  Cardiology  Procedures:    Antibiotics:    HPI/Subjective: Feeling better, breathing better. Still has lower extremity edema, right greater than left.  Objective: Filed Vitals:   02/22/14 1314 02/22/14 1619 02/22/14 2156 02/23/14 0500  BP: 118/67 165/83 173/69 163/87  Pulse: 86 77 90 85  Temp: 97.2 F (36.2 C) 97.9 F (36.6 C) 97.6 F (36.4 C) 97.5 F (36.4 C)  TempSrc: Oral  Oral Oral  Resp: 18 18 20 20   Height:      Weight:    69.945 kg (154 lb 3.2 oz)  SpO2: 97% 98% 99% 100%    Intake/Output Summary (Last 24 hours) at 02/23/14 1244 Last data filed at 02/23/14 0952  Gross per 24 hour  Intake    393 ml   Output    950 ml  Net   -557 ml     Filed Weights   02/21/14 1703 02/22/14 0549 02/23/14 0500  Weight: 72.485 kg (159 lb 12.8 oz) 71.079 kg (156 lb 11.2 oz) 69.945 kg (154 lb 3.2 oz)    Exam:   Afebrile, hypertensive. Vitals otherwise stable. No hypoxia.  Gen. Appears calm and comfortable. Speech fluent and clear.  Cardiovascular regular rate and rhythm. No murmur, rub or gallop. Right greater than left lower extremity edema, 2+.  Telemetry sinus rhythm.  Respiratory clear to auscultation bilaterally. No wheezes, rales or rhonchi. Normal respiratory effort.  Psychiatric grossly normal mood and affect. Speech fluent and appropriate.  Data Reviewed:  Capillary blood sugars stable.  BUN 32, creatinine 2.17, appears very close to baseline.  Hemoglobin stable 8.3.  Scheduled Meds: . acidophilus  1 capsule Oral Daily  . dipyridamole-aspirin  1 capsule Oral BID  . donepezil  5 mg Oral QHS  . folic acid  1 mg Oral Daily  . furosemide  60 mg Intravenous Q12H  . glipiZIDE  2.5 mg Oral QAC breakfast  . heparin  5,000 Units Subcutaneous 3 times per day  . hydrALAZINE  75 mg Oral 3 times per day  . insulin aspart  0-5 Units Subcutaneous QHS  . insulin aspart  0-9 Units Subcutaneous TID WC  . isosorbide mononitrate  15 mg Oral Daily  . levETIRAcetam  500 mg Oral BID  . metoprolol succinate  50 mg Oral Daily  . pantoprazole  40 mg Oral Daily  . potassium chloride  40 mEq Oral BID  . simvastatin  10 mg Oral q1800  . sodium bicarbonate  650 mg Oral TID  . sodium chloride  3 mL Intravenous Q12H  . [START ON 02/28/2014] Vitamin D (Ergocalciferol)  50,000 Units Oral Q7 days   Continuous Infusions:   Active Problems:   Diabetes mellitus   CKD (chronic kidney disease) stage 3, GFR 30-59 ml/min   CHF, acute on chronic   Dementia   CHF (congestive heart failure)   Severe mitral regurgitation   Acute on chronic combined systolic and diastolic congestive heart failure   Time  spent 20 minutes

## 2014-02-24 DIAGNOSIS — I059 Rheumatic mitral valve disease, unspecified: Secondary | ICD-10-CM

## 2014-02-24 DIAGNOSIS — N184 Chronic kidney disease, stage 4 (severe): Secondary | ICD-10-CM

## 2014-02-24 LAB — GLUCOSE, CAPILLARY
GLUCOSE-CAPILLARY: 170 mg/dL — AB (ref 70–99)
GLUCOSE-CAPILLARY: 184 mg/dL — AB (ref 70–99)
Glucose-Capillary: 168 mg/dL — ABNORMAL HIGH (ref 70–99)
Glucose-Capillary: 134 mg/dL — ABNORMAL HIGH (ref 70–99)

## 2014-02-24 LAB — BASIC METABOLIC PANEL
BUN: 34 mg/dL — AB (ref 6–23)
CHLORIDE: 104 meq/L (ref 96–112)
CO2: 24 mEq/L (ref 19–32)
CREATININE: 2.26 mg/dL — AB (ref 0.50–1.10)
Calcium: 8.5 mg/dL (ref 8.4–10.5)
GFR calc Af Amer: 24 mL/min — ABNORMAL LOW (ref >90)
GFR calc non Af Amer: 20 mL/min — ABNORMAL LOW (ref >90)
GLUCOSE: 170 mg/dL — AB (ref 70–99)
Potassium: 4.6 mEq/L (ref 3.7–5.3)
Sodium: 139 mEq/L (ref 137–147)

## 2014-02-24 MED ORDER — FUROSEMIDE 10 MG/ML IJ SOLN
80.0000 mg | Freq: Once | INTRAMUSCULAR | Status: AC
  Administered 2014-02-24: 80 mg via INTRAVENOUS
  Filled 2014-02-24: qty 8

## 2014-02-24 MED ORDER — METOPROLOL SUCCINATE ER 50 MG PO TB24
100.0000 mg | ORAL_TABLET | Freq: Every day | ORAL | Status: DC
  Administered 2014-02-24 – 2014-02-25 (×2): 100 mg via ORAL
  Filled 2014-02-24 (×2): qty 2

## 2014-02-24 MED ORDER — ASPIRIN EC 81 MG PO TBEC
81.0000 mg | DELAYED_RELEASE_TABLET | Freq: Every day | ORAL | Status: DC
  Administered 2014-02-25: 81 mg via ORAL
  Filled 2014-02-24: qty 1

## 2014-02-24 MED ORDER — ISOSORBIDE MONONITRATE ER 60 MG PO TB24
60.0000 mg | ORAL_TABLET | Freq: Every day | ORAL | Status: DC
  Administered 2014-02-24 – 2014-02-25 (×2): 60 mg via ORAL
  Filled 2014-02-24 (×2): qty 1

## 2014-02-24 NOTE — Progress Notes (Signed)
PROGRESS NOTE  [name] ZOX:[id number] DOB: 29-Aug-1941 DOA: 02/21/2014 PCP: [name], MD  Summary: 73 year old woman present with bilateral lower extremity edema increasing shortness of breath. Admitted for acute on chronic systolic congestive heart failure.  Assessment/Plan: 1. 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 Appears to be stable at this point. LVEF 40-45% by echocardiogram 12/2013. No ACE/ARB or spironolactone due to chronic renal dysfunction 2. CKD stage IV. Stable. 3. H/o anemia, thought to be iron deficiency. Stable. 4. H/o NSTEMI 12/2013 secondary to demand ischemia from severe anemia. Stable. 5. Severe mitral regurgitation. Not a candidate for surgery per cardiology. Continue symptom control with diuretics. 6. DM type 2. Stable. 7. Dementia. Stable. Continue Aricept. 8. Seizure disorder . Stable. Continue Keppra. 9. History of CABG 2002   Change to oral Lasix 4/4.  Add low-dose aspirin   BMP in AM  Likely home 4/4.  Code Status: DNR DVT prophylaxis: heparin Family Communication: Disposition Plan:   [name], MD  [facility]  Pager [phone] If 7PM-7AM, please contact night-coverage at [email or web address], password [email or web address] 02/24/2014, 6:09 PM  LOS: 3 days   Consultants:  Cardiology  Procedures:    Antibiotics:    HPI/Subjective: Overall feeling better. Breathing better. No new issues.  Objective: Filed Vitals:   02/23/14 2102 02/24/14 0500 02/24/14 0656 02/24/14 1310  BP: 157/76  159/89 105/50  Pulse: 79  84 76  Temp: 98.3 F (36.8 C)  97.6 F (36.4 C) 98.1 F (36.7 C)  TempSrc: Oral  Oral Oral  Resp: 20  20 20   Height:      Weight:  70.444 kg (155 lb 4.8 oz)    SpO2: 95%  99% 100%    Intake/Output Summary (Last 24 hours) at 02/24/14 1809 Last data filed at 02/24/14 1233  Gross per 24 hour  Intake    600 ml  Output    200 ml  Net    400 ml     Filed Weights   02/22/14 0549 02/23/14 0500  02/24/14 0500  Weight: 71.079 kg (156 lb 11.2 oz) 69.945 kg (154 lb 3.2 oz) 70.444 kg (155 lb 4.8 oz)    Exam:   Afebrile, vital signs stable. No hypoxia.  General. Appears calm and comfortable. Speech fluent and clear.  Respiratory clear to auscultation bilaterally. No wheezes, rales or rhonchi. Normal respiratory effort.  Cardiovascular regular rate and rhythm. No murmur, rub or gallop. 2+ right lower extremity edema, 1+ left lower edema.  Psychiatric grossly normal mood and affect. Speech fluent and appropriate.   Data Reviewed:  Capillary blood sugars stable.  Weight without significant change. Multiple voids, urine output unclear.  Creatinine somewhat increased, 2.26. BUN stable.  Scheduled Meds: . acidophilus  1 capsule Oral Daily  . dipyridamole-aspirin  1 capsule Oral BID  . donepezil  5 mg Oral QHS  . folic acid  1 mg Oral Daily  . glipiZIDE  2.5 mg Oral QAC breakfast  . heparin  5,000 Units Subcutaneous 3 times per day  . hydrALAZINE  75 mg Oral 3 times per day  . insulin aspart  0-5 Units Subcutaneous QHS  . insulin aspart  0-9 Units Subcutaneous TID WC  . isosorbide mononitrate  60 mg Oral Daily  . levETIRAcetam  500 mg Oral BID  . metoprolol succinate  100 mg Oral Daily  . pantoprazole  40 mg Oral Daily  . potassium chloride  40 mEq Oral BID  . simvastatin  10 mg Oral q1800  . sodium  bicarbonate  650 mg Oral TID  . sodium chloride  3 mL Intravenous Q12H  . [START ON 02/28/2014] Vitamin D (Ergocalciferol)  50,000 Units Oral Q7 days   Continuous Infusions:   Principal Problem:   Acute on chronic combined systolic and diastolic congestive heart failure Active Problems:   Diabetes mellitus   CHF, acute on chronic   Dementia   CHF (congestive heart failure)   Severe mitral regurgitation   Chronic kidney disease (CKD), stage IV (severe)   Time spent 20 minutes

## 2014-02-24 NOTE — Progress Notes (Signed)
Subjective: Denies SOB at rest  No CP  Appetite good   Objective: Filed Vitals:   02/23/14 1330 02/23/14 2102 02/24/14 0500 02/24/14 0656  BP: 159/82 157/76  159/89  Pulse: 86 79  84  Temp: 97.9 F (36.6 C) 98.3 F (36.8 C)  97.6 F (36.4 C)  TempSrc:  Oral  Oral  Resp: 20 20  20   Height:      Weight:   155 lb 4.8 oz (70.444 kg)   SpO2: 100% 95%  99%   Weight change: 1 lb 1.6 oz (0.499 kg)  Intake/Output Summary (Last 24 hours) at 02/24/14 0848 Last data filed at 02/23/14 2200  Gross per 24 hour  Intake    473 ml  Output   1000 ml  Net   -527 ml    General: Alert, awake, oriented x3, in no acute distress Neck:  JVP is normal Heart: Regular rate and rhythm, without murmurs, rubs, gallops.  Lungs: Coarse upper airway sounds  No rale.   Exemities:  Tr edema.   Neuro: Grossly intact, nonfocal.  Nt negative 807 cc  Lab Results: Results for orders placed during the hospital encounter of 02/21/14 (from the past 24 hour(s))  GLUCOSE, CAPILLARY     Status: Abnormal   Collection Time    02/23/14 11:52 AM      Result Value Ref Range   Glucose-Capillary 210 (*) 70 - 99 mg/dL   Comment 1 Notify RN     Comment 2 Documented in Chart    GLUCOSE, CAPILLARY     Status: None   Collection Time    02/23/14  4:47 PM      Result Value Ref Range   Glucose-Capillary 97  70 - 99 mg/dL   Comment 1 Notify RN     Comment 2 Documented in Chart    GLUCOSE, CAPILLARY     Status: Abnormal   Collection Time    02/23/14  9:05 PM      Result Value Ref Range   Glucose-Capillary 185 (*) 70 - 99 mg/dL  BASIC METABOLIC PANEL     Status: Abnormal   Collection Time    02/24/14  6:10 AM      Result Value Ref Range   Sodium 139  137 - 147 mEq/L   Potassium 4.6  3.7 - 5.3 mEq/L   Chloride 104  96 - 112 mEq/L   CO2 24  19 - 32 mEq/L   Glucose, Bld 170 (*) 70 - 99 mg/dL   BUN 34 (*) 6 - 23 mg/dL   Creatinine, Ser 2.26 (*) 0.50 - 1.10 mg/dL   Calcium 8.5  8.4 - 10.5 mg/dL   GFR calc non Af Amer  20 (*) >90 mL/min   GFR calc Af Amer 24 (*) >90 mL/min  GLUCOSE, CAPILLARY     Status: Abnormal   Collection Time    02/24/14  7:50 AM      Result Value Ref Range   Glucose-Capillary 168 (*) 70 - 99 mg/dL    Studies/Results: No results found.  Medications:  Reviewed   @PROBHOSP @  1.  Acute on chronic systolic/diastolic CHF  Patient has not diuresed much since admit for appearance of CXR  WIth renal function and MR may not be able to get much drier.  I would give lasix once this AM  Reassess renal function in AM  BNP may be chronically elevated.     2.  CKD  Cr is mildly increased from baseline.  3.  CAD  S/p CABG 2002  No evid of active ischemia.    4  Severe MR.  Continue medical Rx  Not candidate for intervention    5.  HTN  Still not well controlled  I would recomm increasing metoprolol and imdur.      LOS: 3 days   Dorris Carnes 02/24/2014, 8:48 AM

## 2014-02-25 LAB — BASIC METABOLIC PANEL
BUN: 36 mg/dL — ABNORMAL HIGH (ref 6–23)
CO2: 26 mEq/L (ref 19–32)
Calcium: 9.3 mg/dL (ref 8.4–10.5)
Chloride: 99 mEq/L (ref 96–112)
Creatinine, Ser: 2.25 mg/dL — ABNORMAL HIGH (ref 0.50–1.10)
GFR calc Af Amer: 24 mL/min — ABNORMAL LOW (ref >90)
GFR, EST NON AFRICAN AMERICAN: 20 mL/min — AB (ref >90)
Glucose, Bld: 174 mg/dL — ABNORMAL HIGH (ref 70–99)
POTASSIUM: 4.7 meq/L (ref 3.7–5.3)
SODIUM: 137 meq/L (ref 137–147)

## 2014-02-25 LAB — GLUCOSE, CAPILLARY
GLUCOSE-CAPILLARY: 195 mg/dL — AB (ref 70–99)
Glucose-Capillary: 193 mg/dL — ABNORMAL HIGH (ref 70–99)
Glucose-Capillary: 190 mg/dL — ABNORMAL HIGH (ref 70–99)

## 2014-02-25 MED ORDER — HYDRALAZINE HCL 25 MG PO TABS: 75.0000 mg | ORAL_TABLET | Freq: Three times a day (TID) | ORAL | Status: DC

## 2014-02-25 MED ORDER — ASPIRIN 81 MG PO TBEC: 81.0000 mg | DELAYED_RELEASE_TABLET | Freq: Every day | ORAL | Status: DC

## 2014-02-25 MED ORDER — METOPROLOL SUCCINATE ER 100 MG PO TB24: 100.0000 mg | ORAL_TABLET | Freq: Every day | ORAL | Status: DC

## 2014-02-25 NOTE — Progress Notes (Signed)
Patient received discharge instructions along with follow up appointments and prescriptions. Patient's daughter verbalized understanding of all instructions. Patient was escorted by staff via wheelchair to vehicle. Patient discharged to home in stable condition.

## 2014-02-25 NOTE — Progress Notes (Signed)
PROGRESS NOTE  Tenicia Gural Maimone WUX:[id number] DOB: [birth year] DOA: 02/21/2014 PCP: [name], MD Primary cardiologist: Dr. Carlyle Dolly MD  Summary: 73 year old woman present with bilateral lower extremity edema increasing shortness of breath. Admitted for acute on chronic systolic congestive heart failure.  Assessment/Plan: 1. 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 Compensated. Weight stable, down 2 kg from admission. LVEF 40-45% by echocardiogram 12/2013. No ACE/ARB or spironolactone due to chronic renal dysfunction. 2. CKD stage IV. Stable. 3. H/o anemia, thought to be iron deficiency. Stable. 4. H/o NSTEMI 12/2013 secondary to demand ischemia from severe anemia. Stable. 5. Severe mitral regurgitation. Not a candidate for surgery per cardiology. Continue symptom control with diuretics. 6. DM type 2. Stable. Resume glipizide on discharge. 7. Dementia. Stable. Continue Aricept. 8. Seizure disorder. Stable. Continue Keppra. 9. History of CABG 2002   Home today.  [name], MD  [facility]  Pager [phone] If 7PM-7AM, please contact night-coverage at [email or web address], password [facility] 02/25/2014, 3:10 PM  LOS: 4 days   Consultants:  Cardiology  Procedures:    Antibiotics:    HPI/Subjective: Continues to feel better. Breathing clear. No nausea or vomiting. Wants to go home.  Objective: Filed Vitals:   02/24/14 0656 02/24/14 1310 02/24/14 2013 02/25/14 0500  BP: 159/89 105/50 153/80 166/85  Pulse: 84 76 76 77  Temp: 97.6 F (36.4 C) 98.1 F (36.7 C) 98.6 F (37 C) 97.3 F (36.3 C)  TempSrc: Oral Oral Oral Oral  Resp: 20 20 20 18   Height:      Weight:    70.5 kg (155 lb 6.8 oz)  SpO2: 99% 100% 100% 97%    Intake/Output Summary (Last 24 hours) at 02/25/14 1510 Last data filed at 02/25/14 1100  Gross per 24 hour  Intake    480 ml  Output    450 ml  Net     30 ml     Filed Weights   02/23/14 0500 02/24/14 0500 02/25/14  0500  Weight: 69.945 kg (154 lb 3.2 oz) 70.444 kg (155 lb 4.8 oz) 70.5 kg (155 lb 6.8 oz)    Exam:   Afebrile, vital signs stable. No hypoxia.  General. Appears calm and comfortable, speech fluent and clear.  Respiratory clear to auscultation bilaterally. No wheezes, rales or rhonchi. Normal respiratory effort.  Cardiovascular regular rate and rhythm. No murmur, rub or gallop. 1+ right lower extremity edema. Trace left lower extremity edema.  Psychiatric. Grossly normal mood and affect. Speech fluent and appropriate.  Data Reviewed:  Capillary blood sugars stable.  Creatinine, BUN stable at 2.25/36. Potassium normal.  Scheduled Meds: . acidophilus  1 capsule Oral Daily  . aspirin EC  81 mg Oral Daily  . dipyridamole-aspirin  1 capsule Oral BID  . donepezil  5 mg Oral QHS  . folic acid  1 mg Oral Daily  . glipiZIDE  2.5 mg Oral QAC breakfast  . heparin  5,000 Units Subcutaneous 3 times per day  . hydrALAZINE  75 mg Oral 3 times per day  . insulin aspart  0-5 Units Subcutaneous QHS  . insulin aspart  0-9 Units Subcutaneous TID WC  . isosorbide mononitrate  60 mg Oral Daily  . levETIRAcetam  500 mg Oral BID  . metoprolol succinate  100 mg Oral Daily  . pantoprazole  40 mg Oral Daily  . potassium chloride  40 mEq Oral BID  . simvastatin  10 mg Oral q1800  . sodium bicarbonate  650 mg Oral TID  .  sodium chloride  3 mL Intravenous Q12H  . [START ON 02/28/2014] Vitamin D (Ergocalciferol)  50,000 Units Oral Q7 days   Continuous Infusions:   Principal Problem:   Acute on chronic combined systolic and diastolic congestive heart failure Active Problems:   Diabetes mellitus   CHF, acute on chronic   Dementia   CHF (congestive heart failure)   Severe mitral regurgitation   Chronic kidney disease (CKD), stage IV (severe)

## 2014-02-25 NOTE — Discharge Summary (Signed)
Physician Discharge Summary  [name] HBZ:[id number] DOB: 1941/11/09 DOA: 02/21/2014  PCP: [name], MD Primary cardiologist: Dr. Carlyle Dolly MD  Admit date: 02/21/2014 Discharge date: 02/25/2014  Recommendations for Outpatient Follow-up:  1. Followup systolic and diastolic congestive heart failure as well as severe mitral regurgitation 2. Follow chronic kidney disease stage IV 3. Followup anemia   Follow-up Information   Follow up with [facility] K, MD. Schedule an appointment as soon as possible for a visit in 2 weeks.   Specialty:  Family Medicine   Contact information:   [facility] [place] [address] [phone]       Follow up with Carlyle Dolly, F, MD. Schedule an appointment as soon as possible for a visit in 1 week.   Specialty:  Cardiology   Contact information:   [address] [place] [address] [phone]      Discharge Diagnoses:  1. 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 Chronic kidney disease stage IV 3. Severe mitral regurgitation 4. History of anemia thought to be on deficiency 5. Diabetes mellitus type 2 6. Dementia  Discharge Condition: Improved Disposition: Home  Diet recommendation: Heart healthy diabetic diet  Filed Weights   02/23/14 0500 02/24/14 0500 02/25/14 0500  Weight: 69.945 kg (154 lb 3.2 oz) 70.444 kg (155 lb 4.8 oz) 70.5 kg (155 lb 6.8 oz)    History of present illness:  73 year old woman present with bilateral lower extremity edema increasing shortness of breath. Admitted for acute on chronic systolic congestive heart failure.  Hospital Course:  Improved rapidly with IV diuresis, seen with cardiology in consultation, antihypertensives/cardiac medications were adjusted, heart failure now compensated and patient stable for discharge. Hospitalization uncomplicated. See individual issues below.  1. 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  Compensated. Weight stable, down 2 kg from admission. LVEF 40-45% by echocardiogram 12/2013. No ACE/ARB or spironolactone due to chronic renal dysfunction. Resume oral diuretics. Cardiology titrated up beta blocker, hydralazine, recommended low-dose aspirin in addition to Aggrenox. 2. CKD stage IV. Stable. 3. H/o anemia, thought to be iron deficiency. Stable. 4. H/o NSTEMI 12/2013 secondary to demand ischemia from severe anemia. Stable. 5. Severe mitral regurgitation. Not a candidate for surgery per cardiology. Continue symptom control with diuretics. 6. DM type 2. Stable. Resume glipizide on discharge. 7. Dementia. Stable. Continue Aricept. 8. Seizure disorder. Stable. Continue Keppra. 9. History of CABG 2002  Consultants:  Cardiology Procedures:  None  Discharge Instructions  Discharge Orders   Future Appointments Provider Department Dept Phone   03/01/2014 9:20 AM Arnoldo Lenis, MD [place] [phone]   Future Orders Complete By Expires   Diet - low sodium heart healthy  As directed    Diet Carb Modified  As directed    Discharge instructions  As directed    Comments:     Call physician or seek immediate medical attention for shortness of breath, chest pain or worsening of condition.   Increase activity slowly  As directed        Medication List         AGGRENOX 200-25 MG per 12 hr capsule  Generic drug:  dipyridamole-aspirin  Take 1 capsule by mouth 2 (two) times daily.     aspirin 81 MG EC tablet  Take 1 tablet (81 mg total) by mouth daily.     donepezil 5 MG tablet  Commonly known as:  ARICEPT  Take 5 mg by mouth daily.  folic acid 1 MG tablet  Commonly known as:  FOLVITE  Take 1 mg by mouth daily.     furosemide 20 MG tablet  Commonly known as:  LASIX  Take 1 tablet (20 mg total) by mouth daily.     glipiZIDE 5 MG tablet  Commonly known as:  GLUCOTROL  Take 0.5 tablets (2.5 mg total) by mouth daily before breakfast.     hydrALAZINE  25 MG tablet  Commonly known as:  APRESOLINE  Take 3 tablets (75 mg total) by mouth every 8 (eight) hours.     isosorbide mononitrate 15 mg Tb24 24 hr tablet  Commonly known as:  IMDUR  Take 0.5 tablets (15 mg total) by mouth daily.     levETIRAcetam 100 MG/ML solution  Commonly known as:  KEPPRA  Take 5 mLs by mouth 2 (two) times daily.     metoprolol succinate 100 MG 24 hr tablet  Commonly known as:  TOPROL-XL  Take 1 tablet (100 mg total) by mouth daily. Take with or immediately following a meal.     pantoprazole 40 MG tablet  Commonly known as:  PROTONIX  Take 1 tablet (40 mg total) by mouth daily.     potassium chloride 10 MEQ tablet  Commonly known as:  K-DUR  Take 1 tablet (10 mEq total) by mouth daily.     pravastatin 20 MG tablet  Commonly known as:  PRAVACHOL  Take 20 mg by mouth daily.     RESTORA Caps  Take 1 capsule by mouth daily.     sodium bicarbonate 650 MG tablet  Take 650 mg by mouth 3 (three) times daily.     Vitamin D (Ergocalciferol) 50000 UNITS Caps capsule  Commonly known as:  DRISDOL  Take 50,000 Units by mouth every 7 (seven) days. Takes on Tuesday       No Known Allergies  The results of significant diagnostics from this hospitalization (including imaging, microbiology, ancillary and laboratory) are listed below for reference.    Significant Diagnostic Studies: US Venous Img Lower Unilateral Right  02/20/2014   CLINICAL DATA:  Right leg swelling  EXAM: Right LOWER EXTREMITY VENOUS DOPPLER ULTRASOUND  TECHNIQUE: Gray-scale sonography with graded compression, as well as color Doppler and duplex ultrasound, were performed to evaluate the deep venous system from the level of the common femoral vein through the popliteal and proximal calf veins. Spectral Doppler was utilized to evaluate flow at rest and with distal augmentation maneuvers.  COMPARISON:  None.  FINDINGS: Thrombus within deep veins:  None visualized.  Compressibility of deep veins:   Normal.  Duplex waveform respiratory phasicity:  Normal.  Duplex waveform response to augmentation:  Normal.  Venous reflux:  None visualized.  Other findings:  Diffuse calf edema is noted.  IMPRESSION: No evidence of deep venous thrombosis is noted.   Electronically Signed   By: Inez Catalina M.D.   On: 02/20/2014 12:01   Dg Chest Portable 1 View  02/21/2014   CLINICAL DATA:  Shortness of breath since this morning  EXAM: PORTABLE CHEST - 1 VIEW  COMPARISON:  DG CHEST 1V PORT dated 01/19/2014; DG CHEST 2 VIEW dated 07/22/2013; DG CHEST 1 VIEW dated 09/03/2012  FINDINGS: Stable mild to moderate cardiac enlargement in this patient who is status post CABG. There is vascular congestion. There is moderately severe interstitial prominence.  IMPRESSION: Cardiogenic pulmonary edema   Electronically Signed   By: Skipper Cliche M.D.   On: 02/21/2014 13:08  Microbiology: Recent Results (from the past 240 hour(s))  CLOSTRIDIUM DIFFICILE BY PCR     Status: None   Collection Time    02/20/14  4:30 AM      Result Value Ref Range Status   C difficile by pcr Not Detected  Not Detected Final   Comment:       This assay detects the presence of Clostridium difficile DNA coding     for toxin B (tcdB) by real-time polymerase chain reaction (PCR)     amplification.     This test was developed and its performance characteristics have been     determined by [employer]. Performance characteristics refer     to the analytical performance of the test. This test has not been     cleared or approved by the [place] Food and Drug Administration. The FDA     has determined that such clearance or approval is not necessary. This     laboratory is certified under the [place] as qualified to perform high complexity clinical     laboratory testing.     Labs: Basic Metabolic Panel:  Recent Labs Lab 02/21/14 1230 02/21/14 1445 02/22/14 0226 02/23/14 0609 02/24/14 0610  02/25/14 0657  NA SAMPLE HEMOLYSED TO BE RECOLLECTED 143 144 145 139 137  K  --  3.0* 3.1* 4.1 4.6 4.7  CL  --  107 108 109 104 99  CO2  --  23 23 26 24 26   GLUCOSE  --  166* 141* 176* 170* 174*  BUN  --  35* 33* 32* 34* 36*  CREATININE  --  2.05* 1.99* 2.17* 2.26* 2.25*  CALCIUM  --  8.3* 8.4 8.6 8.5 9.3  MG 2.1  --   --   --   --   --    Liver Function Tests:  Recent Labs Lab 02/22/14 0226  AST 32  ALT 26  ALKPHOS 73  BILITOT 0.2*  PROT 7.5  ALBUMIN 2.4*   CBC:  Recent Labs Lab 02/21/14 1230 02/22/14 0226 02/23/14 0609  WBC 4.2 3.8* 3.6*  NEUTROABS 3.0  --   --   HGB 8.7* 8.4* 8.3*  HCT 27.6* 26.4* 26.5*  MCV 85.7 84.6 85.8  PLT 268 244 241   Cardiac Enzymes:  Recent Labs Lab 02/21/14 1230 02/21/14 1700 02/21/14 1952 02/22/14 0226  TROPONINI <0.30 <0.30 <0.30 <0.30     Recent Labs  07/22/13 1420 02/21/14 1230 02/22/14 0226  PROBNP 27695.0* 35849.0* 38775.0*   CBG:  Recent Labs Lab 02/24/14 1126 02/24/14 1627 02/24/14 2144 02/25/14 0728 02/25/14 1130  GLUCAP 170* 134* 184* 193* 190*    Principal Problem:   Acute on chronic combined systolic and diastolic congestive heart failure Active Problems:   Diabetes mellitus   CHF, acute on chronic   Dementia   CHF (congestive heart failure)   Severe mitral regurgitation   Chronic kidney disease (CKD), stage IV (severe)   Time coordinating discharge: 35 minutes  Signed:  [name], MD [facility] 02/25/2014, 3:22 PM

## 2014-03-01 ENCOUNTER — Ambulatory Visit (INDEPENDENT_AMBULATORY_CARE_PROVIDER_SITE_OTHER): Payer: Medicare Other | Admitting: Cardiology

## 2014-03-01 ENCOUNTER — Encounter: Payer: Self-pay | Admitting: Cardiology

## 2014-03-01 VITALS — BP 149/70 | HR 93 | Ht 65.0 in | Wt 153.8 lb

## 2014-03-01 DIAGNOSIS — I5042 Chronic combined systolic (congestive) and diastolic (congestive) heart failure: Secondary | ICD-10-CM

## 2014-03-01 DIAGNOSIS — I10 Essential (primary) hypertension: Secondary | ICD-10-CM

## 2014-03-01 DIAGNOSIS — I059 Rheumatic mitral valve disease, unspecified: Secondary | ICD-10-CM

## 2014-03-01 DIAGNOSIS — I340 Nonrheumatic mitral (valve) insufficiency: Secondary | ICD-10-CM

## 2014-03-01 DIAGNOSIS — I2510 Atherosclerotic heart disease of native coronary artery without angina pectoris: Secondary | ICD-10-CM

## 2014-03-01 DIAGNOSIS — I509 Heart failure, unspecified: Secondary | ICD-10-CM

## 2014-03-01 MED ORDER — TORSEMIDE 20 MG PO TABS: 20.0000 mg | ORAL_TABLET | Freq: Every day | ORAL | Status: DC

## 2014-03-01 NOTE — Progress Notes (Signed)
Clinical Summary [name] is a 73 y.o.female for hospital follow up appointment.    1. CHF - echo 12/2013 LVEF 32-67%, grade I diastolic dysfunction - recent admit 01/2014 with volume overload, she was diuresed with improvement of symptoms.  - therapy has been limited by renal dysfunction - discharge wt 155 lbs per notes, discharge Cr 2.25 (baseline variable 2-3). Today her weight is 153 lbs.   - since discharge denies any significant SOB. Does report some swelling in legs that is persistent - compliant with meds since discharge.   2. Anemia  - recent admit 12/2013 with generalized weakness, Hgb ws 4.8.  - transfused 3 units of pRBCs, inpatient EGD without clear source of bleed. She was diagnosed with H.pylori gastritis but per notes not thought to have caused this - her ASA was initially held, has since been restarted - Hgb with slow trend down according to trend, denies any bleeding issues.   3. CAD  - prior CABG in 2002, LVEF has been stable at 40-45% between 2013 and recent study 12/2013  - mild troponin elevated at 0.76 during recent admit 12/2013 thought to be demand ischemia, had been on antiplatelet at home. Held at time of discharge with plan to resume per notes if Hgb stable   - denies any recent chest pain. She is back on ASA as described above  4. Severe MR  - functional MR by echo 12/2013, poor candidate at this time for consideration for surgery due to overall comorbidities including dementia.  - continue symptom control with diuretics.  - increase afterload reduction with increased hydral dose  5. Dementia  - followed by primary - reports she lives with daughter who helps with her care and making sure she takes her medicines  Past Medical History  Diagnosis Date  . Type 2 diabetes mellitus   . Essential hypertension, benign   . History of stroke     Previously on Coumadin  . MI, old     Reported 6  . Seizure disorder   . Coronary atherosclerosis of  native coronary artery     Multivessel status post CABG 2002  . History of GI bleed     Erosive gastritis and duodenitis by EGD 2002  . Mixed hyperlipidemia   . Ischemic cardiomyopathy     LVEF 40-45% March 2013  . Dementia   . CKD (chronic kidney disease) stage 4, GFR 15-29 ml/min   . Stroke   . Seizures      No Known Allergies   Current Outpatient Prescriptions  Medication Sig Dispense Refill  . AGGRENOX 25-200 MG per 12 hr capsule Take 1 capsule by mouth 2 (two) times daily.       Marland Kitchen aspirin EC 81 MG EC tablet Take 1 tablet (81 mg total) by mouth daily.      Marland Kitchen donepezil (ARICEPT) 5 MG tablet Take 5 mg by mouth daily.      . folic acid (FOLVITE) 1 MG tablet Take 1 mg by mouth daily.      . furosemide (LASIX) 20 MG tablet Take 1 tablet (20 mg total) by mouth daily.  30 tablet  1  . glipiZIDE (GLUCOTROL) 5 MG tablet Take 0.5 tablets (2.5 mg total) by mouth daily before breakfast.  30 tablet  1  . hydrALAZINE (APRESOLINE) 25 MG tablet Take 3 tablets (75 mg total) by mouth every 8 (eight) hours.  90 tablet  0  . isosorbide mononitrate (IMDUR) 15 mg TB24  24 hr tablet Take 0.5 tablets (15 mg total) by mouth daily.  30 tablet  1  . levETIRAcetam (KEPPRA) 100 MG/ML solution Take 5 mLs by mouth 2 (two) times daily.      . metoprolol succinate (TOPROL-XL) 100 MG 24 hr tablet Take 1 tablet (100 mg total) by mouth daily. Take with or immediately following a meal.  30 tablet  0  . pantoprazole (PROTONIX) 40 MG tablet Take 1 tablet (40 mg total) by mouth daily.  30 tablet  1  . potassium chloride (K-DUR) 10 MEQ tablet Take 1 tablet (10 mEq total) by mouth daily.  30 tablet  1  . pravastatin (PRAVACHOL) 20 MG tablet Take 20 mg by mouth daily.        . Probiotic Product (RESTORA) CAPS Take 1 capsule by mouth daily.  30 capsule  0  . sodium bicarbonate 650 MG tablet Take 650 mg by mouth 3 (three) times daily.      . Vitamin D, Ergocalciferol, (DRISDOL) 50000 UNITS CAPS Take 50,000 Units by mouth  every 7 (seven) days. Takes on Tuesday       No current facility-administered medications for this visit.     Past Surgical History  Procedure Laterality Date  . Coronary artery bypass graft  July 2002    LIMA to LAD, SVG to OM1, SVG to OM 2, SVG to RCA  . Tee without cardioversion  01/28/2012    Procedure: TRANSESOPHAGEAL ECHOCARDIOGRAM (TEE);  Surgeon: Lelon Perla, MD;  Location: [facility];  Service: Cardiovascular;  Laterality: [name];  . Colonoscopy  2002  . Esophagogastroduodenoscopy  2002  . Esophagogastroduodenoscopy [name] 01/18/2014    KXF:GHWEX hiatal hernia. Abnormal gastric and duodenal bulbar mucosa of uncertain significance - status post gastric bx (chronic gastritis/H.pylori +     No Known Allergies    Family History  Problem Relation Age of Onset  . Stroke Father   . Diabetes type II Mother   . Colon cancer Neg Hx      Social History Ms. Ponce reports that she has quit smoking. Her smoking use included Cigarettes. She has a 50 pack-year smoking history. She does not have any smokeless tobacco history on file. Ms. Gutzmer reports that she does not drink alcohol.   Review of Systems CONSTITUTIONAL: No weight loss, fever, chills, weakness or fatigue.  HEENT: Eyes: No visual loss, blurred vision, double vision or yellow sclerae.No hearing loss, sneezing, congestion, runny nose or sore throat.  SKIN: No rash or itching.  CARDIOVASCULAR: per HPI RESPIRATORY: No shortness of breath, cough or sputum.  GASTROINTESTINAL: No anorexia, nausea, vomiting or diarrhea. No abdominal pain or blood.  GENITOURINARY: No burning on urination, no polyuria NEUROLOGICAL: No headache, dizziness, syncope, paralysis, ataxia, numbness or tingling in the extremities. No change in bowel or bladder control.  MUSCULOSKELETAL: No muscle, back pain, joint pain or stiffness.  LYMPHATICS: No enlarged nodes. No history of splenectomy.  PSYCHIATRIC: No history of depression or anxiety.    ENDOCRINOLOGIC: No reports of sweating, cold or heat intolerance. No polyuria or polydipsia.  Marland Kitchen   Physical Examination p 93 bp 149/70 Wt 153 lbs BMI 26 Gen: resting comfortably, no acute distress HEENT: no scleral icterus, pupils equal round and reactive, no palptable cervical adenopathy,  CV: RRR, 3/6 systolic murmur at apex Resp: Clear to auscultation bilaterally GI: abdomen is soft, non-tender, non-distended, normal bowel sounds, no hepatosplenomegaly MSK: extremities are warm, 2+ LE edema.  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 Chronic combined systolic/diastolic heart failure - appears volume overloaded today, change lasix to torsemide with BMET in a few weeks  2. Anemia - per pcp  3. CAD - no current symptoms, continue secondary prevention and risk factor modificatoin  4. Severe MR - functional MR, poor surgical candidate due to mutliple comorbidities including dementia      Arnoldo Lenis, M.D., F.A.C.C.

## 2014-03-01 NOTE — Patient Instructions (Signed)
Your physician recommends that you schedule a follow-up appointment in: 1 week month with Nurse Curt Bears. NP . This appointment will be scheduled today before you leave.   Your physician has recommended you make the following change in your medication:  Stop: Furosemide (Lasix) Start: Torsemide (Demadex) 20 MG 1 tablet by mouth once daily  Your physician recommends that you return for lab work in: the day before your visit with Curt Bears, NP for BMET.   You may go to one of the following locations to have lab work completed: [facility] [address] have been schedule an appointment with Dr. Berdine Addison on Thursday April 16 @ 2:15 PM.

## 2014-03-07 ENCOUNTER — Telehealth: Payer: Self-pay

## 2014-03-07 NOTE — Telephone Encounter (Signed)
Critical labs to Dr.Hill,confirmed with Jeani Hawking in his office  Glucose 495 mg/dl    Lab result (BMET) faxed to [place] in [place] office today

## 2014-03-08 ENCOUNTER — Encounter: Payer: Self-pay | Admitting: Adult Health

## 2014-03-08 ENCOUNTER — Ambulatory Visit (INDEPENDENT_AMBULATORY_CARE_PROVIDER_SITE_OTHER): Payer: Medicare Other | Admitting: Adult Health

## 2014-03-08 VITALS — BP 130/72 | HR 77 | Ht 65.0 in | Wt 151.0 lb

## 2014-03-08 DIAGNOSIS — I10 Essential (primary) hypertension: Secondary | ICD-10-CM

## 2014-03-08 DIAGNOSIS — D649 Anemia, unspecified: Secondary | ICD-10-CM

## 2014-03-08 DIAGNOSIS — D509 Iron deficiency anemia, unspecified: Secondary | ICD-10-CM

## 2014-03-08 DIAGNOSIS — I259 Chronic ischemic heart disease, unspecified: Secondary | ICD-10-CM

## 2014-03-08 DIAGNOSIS — I509 Heart failure, unspecified: Secondary | ICD-10-CM

## 2014-03-08 NOTE — Assessment & Plan Note (Signed)
She is due to have labs drawn in 2 weeks. Review of the order systolic find that his CBC is requested. Due to her iron deficiency anemia I will add a CBC to her orders. With results also to get her primary care physician Dr. Berdine Addison.

## 2014-03-08 NOTE — Progress Notes (Signed)
HPI: [name] is a 73 year old patient of Dr. Leanord Asal following for ongoing assessment and management of systolic CHF, with most recent echo in February 2013 revealing an LVEF of 2952%, grade 1 diastolic dysfunction. Therapy has been limited by renal dysfunction. Patient also has a history of anemia, CAD status post CABG in 2002, severe MR per echo functional study on 12/2013 is a poor candidate for consideration of surgical repair due to cor morbidities including dementia.    She comes today with her daughter and is without complaints. She is medically compliant. She was recently changed to demedex 20 mg daily due to wt gain and edema. This has helped her greatly. She is otherwise without complaints. No Known Allergies  Current Outpatient Prescriptions  Medication Sig Dispense Refill  . dipyridamole-aspirin (AGGRENOX) 200-25 MG per 12 hr capsule Take 1 capsule by mouth 2 (two) times daily.      . potassium chloride (MICRO-K) 10 MEQ CR capsule Take 10 mEq by mouth daily.      . pravastatin (PRAVACHOL) 20 MG tablet Take 20 mg by mouth daily.      Marland Kitchen glipiZIDE (GLUCOTROL) 5 MG tablet Take 0.5 tablets (2.5 mg total) by mouth daily before breakfast.  30 tablet  1  . hydrALAZINE (APRESOLINE) 25 MG tablet Take 3 tablets (75 mg total) by mouth every 8 (eight) hours.  90 tablet  0  . isosorbide mononitrate (IMDUR) 15 mg TB24 24 hr tablet Take 0.5 tablets (15 mg total) by mouth daily.  30 tablet  1  . levETIRAcetam (KEPPRA) 100 MG/ML solution Take 5 mLs by mouth 2 (two) times daily.      . metoprolol succinate (TOPROL-XL) 100 MG 24 hr tablet Take 1 tablet (100 mg total) by mouth daily. Take with or immediately following a meal.  30 tablet  0  . pantoprazole (PROTONIX) 40 MG tablet Take 1 tablet (40 mg total) by mouth daily.  30 tablet  1  . sodium bicarbonate 650 MG tablet Take 650 mg by mouth 3 (three) times daily.      Marland Kitchen torsemide (DEMADEX) 20 MG tablet Take 1 tablet (20 mg total) by mouth daily.   30 tablet  6  . Vitamin D, Ergocalciferol, (DRISDOL) 50000 UNITS CAPS Take 50,000 Units by mouth every 7 (seven) days. Takes on Tuesday       No current facility-administered medications for this visit.    Past Medical History  Diagnosis Date  . Type 2 diabetes mellitus   . Essential hypertension, benign   . History of stroke     Previously on Coumadin  . MI, old     Reported 59  . Seizure disorder   . Coronary atherosclerosis of native coronary artery     Multivessel status post CABG 2002  . History of GI bleed     Erosive gastritis and duodenitis by EGD 2002  . Mixed hyperlipidemia   . Ischemic cardiomyopathy     LVEF 40-45% March 2013  . Dementia   . CKD (chronic kidney disease) stage 4, GFR 15-29 ml/min   . Stroke   . Seizures     Past Surgical History  Procedure Laterality Date  . Coronary artery bypass graft  July 2002    LIMA to LAD, SVG to OM1, SVG to OM 2, SVG to RCA  . Tee without cardioversion  01/28/2012    Procedure: TRANSESOPHAGEAL ECHOCARDIOGRAM (TEE);  Surgeon: Lelon Perla, MD;  Location: [facility];  Service: Cardiovascular;  Laterality: [name];  . Colonoscopy  2002  . Esophagogastroduodenoscopy  2002  . Esophagogastroduodenoscopy [name] 01/18/2014    VXY:IAXKP hiatal hernia. Abnormal gastric and duodenal bulbar mucosa of uncertain significance - status post gastric bx (chronic gastritis/H.pylori +    ROS: Review of systems complete and found to be negative unless listed above  PHYSICAL EXAM BP 130/72  Pulse 77  Ht 5\' 5"  (1.651 m)  Wt 151 lb (68.493 kg)  BMI 25.13 kg/m2  General: Well developed, well nourished, in no acute distress Head: Eyes PERRLA, No xanthomas.   Normal cephalic and atramatic  Lungs: Clear bilaterally to auscultation and percussion. Heart: HRRR S1 S2, 2/6 systolic murmur.  Pulses are 2+ & equal.            No carotid bruit. No JVD.  No abdominal bruits. No femoral bruits. Abdomen: Bowel sounds are positive, abdomen soft and  non-tender without masses or                  Hernia's noted. Msk:  Back normal, normal gait, uses walker. Normal strength and tone for age. Extremities: No clubbing, cyanosis, right LE  edema. Unilateral.  DP +1 Neuro: Alert and oriented X 3. Psych:  Good affect, responds appropriately      ASSESSMENT AND PLAN

## 2014-03-08 NOTE — Patient Instructions (Addendum)
Your physician recommends that you schedule a follow-up appointment in:  2 months   Your physician recommends that you continue on your current medications as directed. Please refer to the Current Medication list given to you today.   Please get the compression stockings we have ordered for you     Thank you for choosing [facility] !

## 2014-03-08 NOTE — Progress Notes (Deleted)
Name: [name]    DOB: June 06, 1941  Age: 73 y.o.  MR#: [id number]       PCP:  [name], MD      Insurance: Payor: MEDICARE / Plan: MEDICARE PART A AND B / Product Type: *No Product type* /   CC:    Chief Complaint  Patient presents with  . Hypertension  . Coronary Artery Disease    VS Filed Vitals:   03/08/14 1346  BP: 130/72  Pulse: 77  Height: 5\' 5"  (1.651 m)  Weight: 151 lb (68.493 kg)    Weights Current Weight  03/08/14 151 lb (68.493 kg)  03/01/14 153 lb 12.8 oz (69.763 kg)  02/25/14 155 lb 6.8 oz (70.5 kg)    Blood Pressure  BP Readings from Last 3 Encounters:  03/08/14 130/72  03/01/14 149/70  02/25/14 132/70     Admit date:  (Not on file) Last encounter with RMR:  Visit date not found   Allergy Review of patient's allergies indicates no known allergies.  Current Outpatient Prescriptions  Medication Sig Dispense Refill  . dipyridamole-aspirin (AGGRENOX) 200-25 MG per 12 hr capsule Take 1 capsule by mouth 2 (two) times daily.      . potassium chloride (MICRO-K) 10 MEQ CR capsule Take 10 mEq by mouth daily.      . pravastatin (PRAVACHOL) 20 MG tablet Take 20 mg by mouth daily.      Marland Kitchen glipiZIDE (GLUCOTROL) 5 MG tablet Take 0.5 tablets (2.5 mg total) by mouth daily before breakfast.  30 tablet  1  . hydrALAZINE (APRESOLINE) 25 MG tablet Take 3 tablets (75 mg total) by mouth every 8 (eight) hours.  90 tablet  0  . isosorbide mononitrate (IMDUR) 15 mg TB24 24 hr tablet Take 0.5 tablets (15 mg total) by mouth daily.  30 tablet  1  . levETIRAcetam (KEPPRA) 100 MG/ML solution Take 5 mLs by mouth 2 (two) times daily.      . metoprolol succinate (TOPROL-XL) 100 MG 24 hr tablet Take 1 tablet (100 mg total) by mouth daily. Take with or immediately following a meal.  30 tablet  0  . pantoprazole (PROTONIX) 40 MG tablet Take 1 tablet (40 mg total) by mouth daily.  30 tablet  1  . sodium bicarbonate 650 MG tablet Take 650 mg by mouth 3 (three) times daily.      Marland Kitchen  torsemide (DEMADEX) 20 MG tablet Take 1 tablet (20 mg total) by mouth daily.  30 tablet  6  . Vitamin D, Ergocalciferol, (DRISDOL) 50000 UNITS CAPS Take 50,000 Units by mouth every 7 (seven) days. Takes on Tuesday       No current facility-administered medications for this visit.    Discontinued Meds:   There are no discontinued medications.  Patient Active Problem List   Diagnosis Date Noted  . Chronic kidney disease (CKD), stage IV (severe) 02/24/2014  . Severe mitral regurgitation 02/22/2014  . Acute on chronic combined systolic and diastolic congestive heart failure 02/22/2014  . CHF, acute on chronic 02/21/2014  . Dementia 02/21/2014  . CHF (congestive heart failure) 02/21/2014  . Diarrhea 02/20/2014  . Edema of right lower extremity 02/20/2014  . GI bleeding 01/16/2014  . Weakness generalized 01/16/2014  . Anemia associated with acute blood loss 01/16/2014  . NSTEMI (non-ST elevated myocardial infarction) 01/16/2014  . Systolic CHF, chronic last EF 40% 3/13 01/16/2014  . Iron deficiency anemia 01/16/2014  . Acute on chronic renal failure 07/22/2013  . Bilateral lower  extremity edema 07/22/2013  . Anemia 07/22/2013  . Hyperglycemia 12/21/2012  . CKD (chronic kidney disease) stage 3, GFR 30-59 ml/min 12/21/2012  . Diabetes 12/21/2012  . Dehydration 12/21/2012  . CVA (cerebral infarction) 04/19/2012  . Neutropenia 01/30/2012  . Seizure disorder 01/25/2012  . ARF (acute renal failure) 01/25/2012  . Acute CVA Left frontal lobe,   . Hypertension   . Diabetes mellitus     LABS    Component Value Date/Time   NA 137 02/25/2014 0657   NA 139 02/24/2014 0610   NA 145 02/23/2014 0609   K 4.7 02/25/2014 0657   K 4.6 02/24/2014 0610   K 4.1 02/23/2014 0609   CL 99 02/25/2014 0657   CL 104 02/24/2014 0610   CL 109 02/23/2014 0609   CO2 26 02/25/2014 0657   CO2 24 02/24/2014 0610   CO2 26 02/23/2014 0609   GLUCOSE 174* 02/25/2014 0657   GLUCOSE 170* 02/24/2014 0610   GLUCOSE 176* 02/23/2014 0609    BUN 36* 02/25/2014 0657   BUN 34* 02/24/2014 0610   BUN 32* 02/23/2014 0609   CREATININE 2.25* 02/25/2014 0657   CREATININE 2.26* 02/24/2014 0610   CREATININE 2.17* 02/23/2014 0609   CALCIUM 9.3 02/25/2014 0657   CALCIUM 8.5 02/24/2014 0610   CALCIUM 8.6 02/23/2014 0609   GFRNONAA 20* 02/25/2014 0657   GFRNONAA 20* 02/24/2014 0610   GFRNONAA 21* 02/23/2014 0609   GFRAA 24* 02/25/2014 0657   GFRAA 24* 02/24/2014 0610   GFRAA 25* 02/23/2014 0609   CMP     Component Value Date/Time   NA 137 02/25/2014 0657   K 4.7 02/25/2014 0657   CL 99 02/25/2014 0657   CO2 26 02/25/2014 0657   GLUCOSE 174* 02/25/2014 0657   BUN 36* 02/25/2014 0657   CREATININE 2.25* 02/25/2014 0657   CALCIUM 9.3 02/25/2014 0657   PROT 7.5 02/22/2014 0226   ALBUMIN 2.4* 02/22/2014 0226   AST 32 02/22/2014 0226   ALT 26 02/22/2014 0226   ALKPHOS 73 02/22/2014 0226   BILITOT 0.2* 02/22/2014 0226   GFRNONAA 20* 02/25/2014 0657   GFRAA 24* 02/25/2014 0657       Component Value Date/Time   WBC 3.6* 02/23/2014 0609   WBC 3.8* 02/22/2014 0226   WBC 4.2 02/21/2014 1230   HGB 8.3* 02/23/2014 0609   HGB 8.4* 02/22/2014 0226   HGB 8.7* 02/21/2014 1230   HCT 26.5* 02/23/2014 0609   HCT 26.4* 02/22/2014 0226   HCT 27.6* 02/21/2014 1230   MCV 85.8 02/23/2014 0609   MCV 84.6 02/22/2014 0226   MCV 85.7 02/21/2014 1230    Lipid Panel     Component Value Date/Time   CHOL 164 04/20/2012 0618   TRIG 129 04/20/2012 0618   HDL 57 04/20/2012 0618   CHOLHDL 2.9 04/20/2012 0618   VLDL 26 04/20/2012 0618   LDLCALC 81 04/20/2012 0618    ABG No results found for this basename: phart, pco2, pco2art, po2, po2art, hco3, tco2, acidbasedef, o2sat     Lab Results  Component Value Date   TSH 4.621* 07/22/2013   BNP (last 3 results)  Recent Labs  07/22/13 1420 02/21/14 1230 02/22/14 0226  PROBNP 27695.0* 35849.0* 38775.0*   Cardiac Panel (last 3 results) No results found for this basename: CKTOTAL, CKMB, TROPONINI, RELINDX,  in the last 72 hours  Iron/TIBC/Ferritin    Component Value  Date/Time   IRON 12* 01/16/2014 1918   TIBC 322 01/16/2014 1918   FERRITIN 28 01/16/2014  1918     EKG Orders placed during the hospital encounter of 02/21/14  . ED EKG  . EKG 12-LEAD  . EKG 12-LEAD  . EKG     Prior Assessment and Plan Problem List as of 03/08/2014     Cardiovascular and Mediastinum   Acute CVA Left frontal lobe,   Hypertension   NSTEMI (non-ST elevated myocardial infarction)   Systolic CHF, chronic last EF 40% 3/13   CHF, acute on chronic   CHF (congestive heart failure)   Severe mitral regurgitation   Acute on chronic combined systolic and diastolic congestive heart failure     Digestive   GI bleeding     Endocrine   Diabetes mellitus   Diabetes     Nervous and Auditory   Dementia   Seizure disorder   CVA (cerebral infarction)     Genitourinary   ARF (acute renal failure)   CKD (chronic kidney disease) stage 3, GFR 30-59 ml/min   Acute on chronic renal failure   Chronic kidney disease (CKD), stage IV (severe)     Other   Neutropenia   Hyperglycemia   Dehydration   Bilateral lower extremity edema   Anemia   Last Assessment & Plan   02/20/2014 Office Visit Written 02/20/2014 10:32 AM by Mahala Menghini, PA-C     Recent profound normocytic anemia with anemia profile most consistent with iron deficiency. She was Hemoccult positive. EGD revealed H. pylori gastritis it was felt like her degree of anemia was not explained by this. Colonoscopy is recommended as the next step. We will get this scheduled in the near future but initially we need to check for C. difficile and rule out DVT as outlined below. For now she is on Aggrenox but no aspirin. We have requested her most recent labs from Dr. Hinda Lenis to followup on her anemia    Weakness generalized   Anemia associated with acute blood loss   Iron deficiency anemia   Diarrhea   Last Assessment & Plan   02/20/2014 Office Visit Edited 02/20/2014 10:37 AM by Mahala Menghini, PA-C     Diarrhea noted since  antibiotic therapy for H. pylori. 2-3 loose to soft stools daily. Suspect antibiotic associated but she is at increased risk of C. difficile. Check C. difficile PCR. Start restora one daily for 30 days.    Edema of right lower extremity   Last Assessment & Plan   02/20/2014 Office Visit Written 02/20/2014 10:27 AM by Mahala Menghini, PA-C     Acute onset right lower lower extremity edema. Noted in the leg that she has had vein grafting for coronary artery bypass surgery. Need to rule out DVT. Plan for Doppler study today.        Imaging: US Venous Img Lower Unilateral Right  02/20/2014   CLINICAL DATA:  Right leg swelling  EXAM: Right LOWER EXTREMITY VENOUS DOPPLER ULTRASOUND  TECHNIQUE: Gray-scale sonography with graded compression, as well as color Doppler and duplex ultrasound, were performed to evaluate the deep venous system from the level of the common femoral vein through the popliteal and proximal calf veins. Spectral Doppler was utilized to evaluate flow at rest and with distal augmentation maneuvers.  COMPARISON:  None.  FINDINGS: Thrombus within deep veins:  None visualized.  Compressibility of deep veins:  Normal.  Duplex waveform respiratory phasicity:  Normal.  Duplex waveform response to augmentation:  Normal.  Venous reflux:  None visualized.  Other findings:  Diffuse calf edema is noted.  IMPRESSION: No evidence of deep venous thrombosis is noted.   Electronically Signed   By: Inez Catalina M.D.   On: 02/20/2014 12:01   Dg Chest Portable 1 View  02/21/2014   CLINICAL DATA:  Shortness of breath since this morning  EXAM: PORTABLE CHEST - 1 VIEW  COMPARISON:  DG CHEST 1V PORT dated 01/19/2014; DG CHEST 2 VIEW dated 07/22/2013; DG CHEST 1 VIEW dated 09/03/2012  FINDINGS: Stable mild to moderate cardiac enlargement in this patient who is status post CABG. There is vascular congestion. There is moderately severe interstitial prominence.  IMPRESSION: Cardiogenic pulmonary edema   Electronically  Signed   By: Skipper Cliche M.D.   On: 02/21/2014 13:08

## 2014-03-08 NOTE — Assessment & Plan Note (Signed)
She does not appear to have any evidence of fluid overload, lungs are clear. Demadex dose apparently is working well for her at 20 mg daily. I seeing unilateral lower extremity edema on the right. She has had a recent Doppler ultrasound during hospitalization that was negative for DVT. I have advised TED hose and a prescription is been provided for her. She will be seen again in 3 months for ongoing assessment and management.

## 2014-03-08 NOTE — Assessment & Plan Note (Signed)
Pressure is well-controlled currently. Will not make any changes on her medication regimen at this time. She will see [place] again in 3 months unless symptomatic.

## 2014-03-15 ENCOUNTER — Emergency Department (HOSPITAL_COMMUNITY): Payer: Medicare Other

## 2014-03-15 ENCOUNTER — Inpatient Hospital Stay (HOSPITAL_COMMUNITY)
Admission: EM | Admit: 2014-03-15 | Discharge: 2014-03-18 | DRG: 637 | Disposition: A | Discharge status: Home / Self Care | Payer: Medicare Other | Attending: Internal Medicine | Admitting: Internal Medicine

## 2014-03-15 ENCOUNTER — Encounter (HOSPITAL_COMMUNITY): Payer: Self-pay | Admitting: Emergency Medicine

## 2014-03-15 DIAGNOSIS — R531 Weakness: Secondary | ICD-10-CM

## 2014-03-15 DIAGNOSIS — I639 Cerebral infarction, unspecified: Secondary | ICD-10-CM

## 2014-03-15 DIAGNOSIS — N179 Acute kidney failure, unspecified: Secondary | ICD-10-CM | POA: Diagnosis present

## 2014-03-15 DIAGNOSIS — R600 Localized edema: Secondary | ICD-10-CM | POA: Diagnosis present

## 2014-03-15 DIAGNOSIS — I5043 Acute on chronic combined systolic (congestive) and diastolic (congestive) heart failure: Secondary | ICD-10-CM

## 2014-03-15 DIAGNOSIS — R197 Diarrhea, unspecified: Secondary | ICD-10-CM

## 2014-03-15 DIAGNOSIS — D62 Acute posthemorrhagic anemia: Secondary | ICD-10-CM

## 2014-03-15 DIAGNOSIS — I5022 Chronic systolic (congestive) heart failure: Secondary | ICD-10-CM | POA: Diagnosis present

## 2014-03-15 DIAGNOSIS — E782 Mixed hyperlipidemia: Secondary | ICD-10-CM | POA: Diagnosis present

## 2014-03-15 DIAGNOSIS — I340 Nonrheumatic mitral (valve) insufficiency: Secondary | ICD-10-CM

## 2014-03-15 DIAGNOSIS — E1169 Type 2 diabetes mellitus with other specified complication: Principal | ICD-10-CM

## 2014-03-15 DIAGNOSIS — R7309 Other abnormal glucose: Secondary | ICD-10-CM

## 2014-03-15 DIAGNOSIS — Z951 Presence of aortocoronary bypass graft: Secondary | ICD-10-CM

## 2014-03-15 DIAGNOSIS — K922 Gastrointestinal hemorrhage, unspecified: Secondary | ICD-10-CM

## 2014-03-15 DIAGNOSIS — E876 Hypokalemia: Secondary | ICD-10-CM | POA: Diagnosis present

## 2014-03-15 DIAGNOSIS — Z79899 Other long term (current) drug therapy: Secondary | ICD-10-CM

## 2014-03-15 DIAGNOSIS — Z833 Family history of diabetes mellitus: Secondary | ICD-10-CM

## 2014-03-15 DIAGNOSIS — N19 Unspecified kidney failure: Secondary | ICD-10-CM

## 2014-03-15 DIAGNOSIS — E1165 Type 2 diabetes mellitus with hyperglycemia: Principal | ICD-10-CM | POA: Diagnosis present

## 2014-03-15 DIAGNOSIS — I2510 Atherosclerotic heart disease of native coronary artery without angina pectoris: Secondary | ICD-10-CM | POA: Diagnosis present

## 2014-03-15 DIAGNOSIS — Z8673 Personal history of transient ischemic attack (TIA), and cerebral infarction without residual deficits: Secondary | ICD-10-CM

## 2014-03-15 DIAGNOSIS — I509 Heart failure, unspecified: Secondary | ICD-10-CM | POA: Diagnosis present

## 2014-03-15 DIAGNOSIS — F0390 Unspecified dementia without behavioral disturbance: Secondary | ICD-10-CM

## 2014-03-15 DIAGNOSIS — I252 Old myocardial infarction: Secondary | ICD-10-CM

## 2014-03-15 DIAGNOSIS — N183 Chronic kidney disease, stage 3 unspecified: Secondary | ICD-10-CM

## 2014-03-15 DIAGNOSIS — J189 Pneumonia, unspecified organism: Secondary | ICD-10-CM | POA: Diagnosis present

## 2014-03-15 DIAGNOSIS — D509 Iron deficiency anemia, unspecified: Secondary | ICD-10-CM | POA: Diagnosis present

## 2014-03-15 DIAGNOSIS — I214 Non-ST elevation (NSTEMI) myocardial infarction: Secondary | ICD-10-CM

## 2014-03-15 DIAGNOSIS — N189 Chronic kidney disease, unspecified: Secondary | ICD-10-CM

## 2014-03-15 DIAGNOSIS — E162 Hypoglycemia, unspecified: Secondary | ICD-10-CM

## 2014-03-15 DIAGNOSIS — Z823 Family history of stroke: Secondary | ICD-10-CM

## 2014-03-15 DIAGNOSIS — Z87891 Personal history of nicotine dependence: Secondary | ICD-10-CM

## 2014-03-15 DIAGNOSIS — D649 Anemia, unspecified: Secondary | ICD-10-CM

## 2014-03-15 DIAGNOSIS — K219 Gastro-esophageal reflux disease without esophagitis: Secondary | ICD-10-CM | POA: Diagnosis present

## 2014-03-15 DIAGNOSIS — IMO0002 Reserved for concepts with insufficient information to code with codable children: Principal | ICD-10-CM | POA: Diagnosis present

## 2014-03-15 DIAGNOSIS — E860 Dehydration: Secondary | ICD-10-CM

## 2014-03-15 DIAGNOSIS — I10 Essential (primary) hypertension: Secondary | ICD-10-CM | POA: Diagnosis present

## 2014-03-15 DIAGNOSIS — E119 Type 2 diabetes mellitus without complications: Secondary | ICD-10-CM

## 2014-03-15 DIAGNOSIS — I129 Hypertensive chronic kidney disease with stage 1 through stage 4 chronic kidney disease, or unspecified chronic kidney disease: Secondary | ICD-10-CM | POA: Diagnosis present

## 2014-03-15 DIAGNOSIS — N184 Chronic kidney disease, stage 4 (severe): Secondary | ICD-10-CM | POA: Diagnosis present

## 2014-03-15 DIAGNOSIS — R739 Hyperglycemia, unspecified: Secondary | ICD-10-CM

## 2014-03-15 DIAGNOSIS — G40909 Epilepsy, unspecified, not intractable, without status epilepticus: Secondary | ICD-10-CM | POA: Diagnosis present

## 2014-03-15 DIAGNOSIS — I2589 Other forms of chronic ischemic heart disease: Secondary | ICD-10-CM | POA: Diagnosis present

## 2014-03-15 DIAGNOSIS — Z7982 Long term (current) use of aspirin: Secondary | ICD-10-CM

## 2014-03-15 LAB — COMPREHENSIVE METABOLIC PANEL
ALT: 19 U/L (ref 0–35)
AST: 24 U/L (ref 0–37)
Albumin: 2.4 g/dL — ABNORMAL LOW (ref 3.5–5.2)
Alkaline Phosphatase: 67 U/L (ref 39–117)
BILIRUBIN TOTAL: 0.2 mg/dL — AB (ref 0.3–1.2)
BUN: 56 mg/dL — ABNORMAL HIGH (ref 6–23)
CHLORIDE: 105 meq/L (ref 96–112)
CO2: 22 meq/L (ref 19–32)
Calcium: 8.3 mg/dL — ABNORMAL LOW (ref 8.4–10.5)
Creatinine, Ser: 2.79 mg/dL — ABNORMAL HIGH (ref 0.50–1.10)
GFR, EST AFRICAN AMERICAN: 18 mL/min — AB (ref >90)
GFR, EST NON AFRICAN AMERICAN: 16 mL/min — AB (ref >90)
GLUCOSE: 463 mg/dL — AB (ref 70–99)
Potassium: 3.7 mEq/L (ref 3.7–5.3)
SODIUM: 139 meq/L (ref 137–147)
Total Protein: 7.4 g/dL (ref 6.0–8.3)

## 2014-03-15 LAB — GLUCOSE, CAPILLARY: GLUCOSE-CAPILLARY: 264 mg/dL — AB (ref 70–99)

## 2014-03-15 LAB — CBC WITH DIFFERENTIAL/PLATELET
BASOS ABS: 0 10*3/uL (ref 0.0–0.1)
Basophils Relative: 0 % (ref 0–1)
Eosinophils Absolute: 0 10*3/uL (ref 0.0–0.7)
Eosinophils Relative: 0 % (ref 0–5)
HEMATOCRIT: 27.9 % — AB (ref 36.0–46.0)
Hemoglobin: 8.8 g/dL — ABNORMAL LOW (ref 12.0–15.0)
LYMPHS ABS: 0.8 10*3/uL (ref 0.7–4.0)
Lymphocytes Relative: 18 % (ref 12–46)
MCH: 26.2 pg (ref 26.0–34.0)
MCHC: 31.5 g/dL (ref 30.0–36.0)
MCV: 83 fL (ref 78.0–100.0)
Monocytes Absolute: 0.3 10*3/uL (ref 0.1–1.0)
Monocytes Relative: 8 % (ref 3–12)
NEUTROS ABS: 3.3 10*3/uL (ref 1.7–7.7)
Neutrophils Relative %: 74 % (ref 43–77)
PLATELETS: 228 10*3/uL (ref 150–400)
RBC: 3.36 MIL/uL — AB (ref 3.87–5.11)
RDW: 17.2 % — AB (ref 11.5–15.5)
WBC: 4.6 10*3/uL (ref 4.0–10.5)

## 2014-03-15 LAB — URINALYSIS, ROUTINE W REFLEX MICROSCOPIC
Bilirubin Urine: NEGATIVE
Glucose, UA: 250 mg/dL — AB
KETONES UR: NEGATIVE mg/dL
Leukocytes, UA: NEGATIVE
Nitrite: NEGATIVE
PROTEIN: 100 mg/dL — AB
Specific Gravity, Urine: 1.015 (ref 1.005–1.030)
Urobilinogen, UA: 0.2 mg/dL (ref 0.0–1.0)
pH: 6 (ref 5.0–8.0)

## 2014-03-15 LAB — URINE MICROSCOPIC-ADD ON

## 2014-03-15 LAB — TROPONIN I: Troponin I: <0.3 ng/mL (ref <0.30)

## 2014-03-15 LAB — BLOOD GAS, VENOUS
ACID-BASE DEFICIT: 5.3 mmol/L — AB (ref 0.0–2.0)
Bicarbonate: 20.4 mEq/L (ref 20.0–24.0)
DRAWN BY: 25788
O2 Saturation: 57.5 %
TCO2: 19.7 mmol/L (ref 0–100)
pCO2, Ven: 44.1 mmHg — ABNORMAL LOW (ref 45.0–50.0)
pH, Ven: 7.288 (ref 7.250–7.300)
pO2, Ven: 38.1 mmHg (ref 30.0–45.0)

## 2014-03-15 LAB — KETONES, QUALITATIVE: Acetone, Bld: NEGATIVE

## 2014-03-15 LAB — CBG MONITORING, ED: Glucose-Capillary: 386 mg/dL — ABNORMAL HIGH (ref 70–99)

## 2014-03-15 LAB — PRO B NATRIURETIC PEPTIDE: Pro B Natriuretic peptide (BNP): 37134 pg/mL — ABNORMAL HIGH (ref 0–125)

## 2014-03-15 MED ORDER — ONDANSETRON HCL 4 MG PO TABS: 4.0000 mg | ORAL_TABLET | Freq: Four times a day (QID) | ORAL | Status: DC | PRN

## 2014-03-15 MED ORDER — FUROSEMIDE 10 MG/ML IJ SOLN
40.0000 mg | Freq: Two times a day (BID) | INTRAMUSCULAR | Status: DC
  Administered 2014-03-16: 40 mg via INTRAVENOUS
  Filled 2014-03-15: qty 4

## 2014-03-15 MED ORDER — DEXTROSE 5 % IV SOLN
INTRAVENOUS | Status: AC
  Filled 2014-03-15: qty 10

## 2014-03-15 MED ORDER — FERROUS GLUCONATE 324 (38 FE) MG PO TABS
324.0000 mg | ORAL_TABLET | Freq: Two times a day (BID) | ORAL | Status: DC
  Administered 2014-03-16 – 2014-03-18 (×5): 324 mg via ORAL
  Filled 2014-03-15 (×9): qty 1

## 2014-03-15 MED ORDER — FUROSEMIDE 10 MG/ML IJ SOLN
40.0000 mg | Freq: Once | INTRAMUSCULAR | Status: AC
  Administered 2014-03-15: 40 mg via INTRAVENOUS
  Filled 2014-03-15: qty 4

## 2014-03-15 MED ORDER — INSULIN ASPART 100 UNIT/ML ~~LOC~~ SOLN
5.0000 [IU] | Freq: Once | SUBCUTANEOUS | Status: AC
  Administered 2014-03-15: 5 [IU] via SUBCUTANEOUS
  Filled 2014-03-15: qty 1

## 2014-03-15 MED ORDER — HEPARIN SODIUM (PORCINE) 5000 UNIT/ML IJ SOLN
5000.0000 [IU] | Freq: Three times a day (TID) | INTRAMUSCULAR | Status: DC
  Administered 2014-03-15 – 2014-03-18 (×9): 5000 [IU] via SUBCUTANEOUS
  Filled 2014-03-15 (×9): qty 1

## 2014-03-15 MED ORDER — GUAIFENESIN-DM 100-10 MG/5ML PO SYRP: 5.0000 mL | ORAL_SOLUTION | ORAL | Status: DC | PRN

## 2014-03-15 MED ORDER — DEXTROSE 5 % IV SOLN: 1.0000 g | INTRAVENOUS | Status: DC

## 2014-03-15 MED ORDER — FUROSEMIDE 10 MG/ML IJ SOLN: 40.0000 mg | Freq: Two times a day (BID) | INTRAMUSCULAR | Status: DC

## 2014-03-15 MED ORDER — DEXTROSE 5 % IV SOLN
1.0000 g | INTRAVENOUS | Status: DC
  Administered 2014-03-15: 1 g via INTRAVENOUS
  Filled 2014-03-15 (×2): qty 10

## 2014-03-15 MED ORDER — POTASSIUM CHLORIDE CRYS ER 10 MEQ PO TBCR
10.0000 meq | EXTENDED_RELEASE_TABLET | Freq: Every day | ORAL | Status: DC
  Administered 2014-03-16 – 2014-03-18 (×3): 10 meq via ORAL
  Filled 2014-03-15 (×3): qty 1

## 2014-03-15 MED ORDER — SODIUM CHLORIDE 0.9 % IJ SOLN
3.0000 mL | Freq: Two times a day (BID) | INTRAMUSCULAR | Status: DC
  Administered 2014-03-16 – 2014-03-18 (×5): 3 mL via INTRAVENOUS

## 2014-03-15 MED ORDER — ACETAMINOPHEN 650 MG RE SUPP: 650.0000 mg | Freq: Four times a day (QID) | RECTAL | Status: DC | PRN

## 2014-03-15 MED ORDER — RESTORA PO CAPS: 1.0000 | ORAL_CAPSULE | Freq: Every day | ORAL | Status: DC

## 2014-03-15 MED ORDER — FLORANEX PO PACK
1.0000 g | PACK | Freq: Three times a day (TID) | ORAL | Status: DC
  Filled 2014-03-15 (×5): qty 1

## 2014-03-15 MED ORDER — ASPIRIN EC 81 MG PO TBEC
81.0000 mg | DELAYED_RELEASE_TABLET | Freq: Every day | ORAL | Status: DC
  Administered 2014-03-16 – 2014-03-18 (×3): 81 mg via ORAL
  Filled 2014-03-15 (×3): qty 1

## 2014-03-15 MED ORDER — ISOSORBIDE MONONITRATE ER 30 MG PO TB24
15.0000 mg | ORAL_TABLET | Freq: Every day | ORAL | Status: DC
  Administered 2014-03-16 – 2014-03-18 (×3): 15 mg via ORAL
  Filled 2014-03-15 (×3): qty 1

## 2014-03-15 MED ORDER — ASPIRIN-DIPYRIDAMOLE ER 25-200 MG PO CP12
1.0000 | ORAL_CAPSULE | Freq: Two times a day (BID) | ORAL | Status: DC
  Administered 2014-03-16 – 2014-03-18 (×4): 1 via ORAL
  Filled 2014-03-15 (×10): qty 1

## 2014-03-15 MED ORDER — ALBUTEROL SULFATE (2.5 MG/3ML) 0.083% IN NEBU: 2.5000 mg | INHALATION_SOLUTION | Freq: Four times a day (QID) | RESPIRATORY_TRACT | Status: DC | PRN

## 2014-03-15 MED ORDER — VITAMIN D (ERGOCALCIFEROL) 1.25 MG (50000 UNIT) PO CAPS: 50000.0000 [IU] | ORAL_CAPSULE | ORAL | Status: DC

## 2014-03-15 MED ORDER — SODIUM BICARBONATE 650 MG PO TABS
650.0000 mg | ORAL_TABLET | Freq: Three times a day (TID) | ORAL | Status: DC
  Administered 2014-03-15 – 2014-03-18 (×8): 650 mg via ORAL
  Filled 2014-03-15 (×8): qty 1

## 2014-03-15 MED ORDER — AZITHROMYCIN 250 MG PO TABS: 500.0000 mg | ORAL_TABLET | Freq: Every day | ORAL | Status: DC

## 2014-03-15 MED ORDER — HYDRALAZINE HCL 25 MG PO TABS
75.0000 mg | ORAL_TABLET | Freq: Three times a day (TID) | ORAL | Status: DC
  Administered 2014-03-15 – 2014-03-18 (×9): 75 mg via ORAL
  Filled 2014-03-15 (×9): qty 3

## 2014-03-15 MED ORDER — TORSEMIDE 20 MG PO TABS
20.0000 mg | ORAL_TABLET | Freq: Every day | ORAL | Status: DC
  Administered 2014-03-17 – 2014-03-18 (×2): 20 mg via ORAL
  Filled 2014-03-15 (×2): qty 1

## 2014-03-15 MED ORDER — INSULIN ASPART 100 UNIT/ML ~~LOC~~ SOLN: 0.0000 [IU] | Freq: Three times a day (TID) | SUBCUTANEOUS | Status: DC

## 2014-03-15 MED ORDER — PANTOPRAZOLE SODIUM 40 MG PO TBEC
40.0000 mg | DELAYED_RELEASE_TABLET | Freq: Every day | ORAL | Status: DC
  Administered 2014-03-16 – 2014-03-18 (×3): 40 mg via ORAL
  Filled 2014-03-15 (×3): qty 1

## 2014-03-15 MED ORDER — FOLIC ACID 1 MG PO TABS
1.0000 mg | ORAL_TABLET | Freq: Every day | ORAL | Status: DC
  Administered 2014-03-16 – 2014-03-18 (×3): 1 mg via ORAL
  Filled 2014-03-15 (×3): qty 1

## 2014-03-15 MED ORDER — DONEPEZIL HCL 5 MG PO TABS
10.0000 mg | ORAL_TABLET | Freq: Every day | ORAL | Status: DC
  Administered 2014-03-16 – 2014-03-18 (×3): 10 mg via ORAL
  Filled 2014-03-15 (×3): qty 2

## 2014-03-15 MED ORDER — METOPROLOL SUCCINATE ER 50 MG PO TB24
100.0000 mg | ORAL_TABLET | Freq: Every day | ORAL | Status: DC
  Administered 2014-03-16 – 2014-03-18 (×3): 100 mg via ORAL
  Filled 2014-03-15 (×3): qty 2

## 2014-03-15 MED ORDER — ONDANSETRON HCL 4 MG/2ML IJ SOLN: 4.0000 mg | Freq: Four times a day (QID) | INTRAMUSCULAR | Status: DC | PRN

## 2014-03-15 MED ORDER — INSULIN DETEMIR 100 UNIT/ML ~~LOC~~ SOLN
16.0000 [IU] | Freq: Every day | SUBCUTANEOUS | Status: DC
  Filled 2014-03-15: qty 0.16

## 2014-03-15 MED ORDER — INSULIN DETEMIR 100 UNIT/ML ~~LOC~~ SOLN
12.0000 [IU] | Freq: Every day | SUBCUTANEOUS | Status: DC
  Administered 2014-03-15: 12 [IU] via SUBCUTANEOUS
  Filled 2014-03-15: qty 0.12

## 2014-03-15 MED ORDER — LEVETIRACETAM 100 MG/ML PO SOLN
500.0000 mg | Freq: Two times a day (BID) | ORAL | Status: DC
  Administered 2014-03-15 – 2014-03-18 (×6): 500 mg via ORAL
  Filled 2014-03-15 (×10): qty 5

## 2014-03-15 MED ORDER — AZITHROMYCIN 250 MG PO TABS
500.0000 mg | ORAL_TABLET | Freq: Every day | ORAL | Status: DC
  Administered 2014-03-16: 500 mg via ORAL
  Filled 2014-03-15: qty 2

## 2014-03-15 MED ORDER — ACETAMINOPHEN 325 MG PO TABS: 650.0000 mg | ORAL_TABLET | Freq: Four times a day (QID) | ORAL | Status: DC | PRN

## 2014-03-15 MED ORDER — MUPIROCIN 2 % EX OINT
1.0000 "application " | TOPICAL_OINTMENT | Freq: Two times a day (BID) | CUTANEOUS | Status: DC
  Administered 2014-03-15 – 2014-03-18 (×6): 1 via TOPICAL
  Filled 2014-03-15 (×3): qty 22

## 2014-03-15 MED ORDER — INSULIN ASPART 100 UNIT/ML ~~LOC~~ SOLN
0.0000 [IU] | Freq: Every day | SUBCUTANEOUS | Status: DC
  Administered 2014-03-15: 3 [IU] via SUBCUTANEOUS

## 2014-03-15 MED ORDER — LEVETIRACETAM 500 MG/5ML IV SOLN
INTRAVENOUS | Status: AC
  Filled 2014-03-15: qty 5

## 2014-03-15 NOTE — ED Provider Notes (Signed)
CSN: [id number]     Arrival date & time 03/15/14  1601 History  This chart was scribed for Ezequiel Essex, MD by Jenne Campus, ED Scribe. This patient was seen in room APA14/APA14 and the patient's care was started at 4:30 PM.     Chief Complaint  Patient presents with  . Hyperglycemia    Level 5 Caveat: Dementia   The history is provided by a caregiver and the patient. No language interpreter was used.   HPI Comments: [name] is a 73 y.o. female who presents to the Emergency Department with daughter from home complaining of persistent hyperglycemia for past 5 days. The daughter states that the pt was seen by her PCP last week and was switched from glipizide and metformin to tradjenta. She states that the pt's CBGs have not been responsive to the treatment since the switch. Daughter states that she became concerned when she checked the pt's blood sugar at 460 PTA. Current CBG is 386 in the ED. Her daughter states her speech is more slurred than normal which she attributes to the hyperglycemia. Pt states she ambulates with walker. The daughter denies any symptoms of headache, abdominal pain, chest pain, fever, dizziness, or lightheadedness.   Her daughter states that she has a h/o of congestive heart failure and daughter states that the pt appears to have more right leg swelling for the past 3 weeks. The daughter states she had an ultrasound the right legs which was negative for DVTs.  PCP is Dr Berdine Addison   Past Medical History  Diagnosis Date  . Type 2 diabetes mellitus   . Essential hypertension, benign   . History of stroke     Previously on Coumadin  . MI, old     Reported 30  . Seizure disorder   . Coronary atherosclerosis of native coronary artery     Multivessel status post CABG 2002  . History of GI bleed     Erosive gastritis and duodenitis by EGD 2002  . Mixed hyperlipidemia   . Ischemic cardiomyopathy     LVEF 40-45% March 2013  . Dementia   . CKD (chronic  kidney disease) stage 4, GFR 15-29 ml/min   . Stroke   . Seizures    Past Surgical History  Procedure Laterality Date  . Coronary artery bypass graft  July 2002    LIMA to LAD, SVG to OM1, SVG to OM 2, SVG to RCA  . Tee without cardioversion  01/28/2012    Procedure: TRANSESOPHAGEAL ECHOCARDIOGRAM (TEE);  Surgeon: Lelon Perla, MD;  Location: [facility] ENDOSCOPY;  Service: Cardiovascular;  Laterality: [name];  . Colonoscopy  2002  . Esophagogastroduodenoscopy  2002  . Esophagogastroduodenoscopy [name] 01/18/2014    NIO:EVOJJ hiatal hernia. Abnormal gastric and duodenal bulbar mucosa of uncertain significance - status post gastric bx (chronic gastritis/H.pylori +   Family History  Problem Relation Age of Onset  . Stroke Father   . Diabetes type II Mother   . Colon cancer Neg Hx    History  Substance Use Topics  . Smoking status: Former Smoker -- 1.00 packs/day for 50 years    Types: Cigarettes  . Smokeless tobacco: Not on file  . Alcohol Use: No   No OB history provided.  Review of Systems  Unable to perform ROS: Dementia    Allergies  Review of patient's allergies indicates no known allergies.  Home Medications   Prior to Admission medications   Medication Sig Start Date End Date Taking? Authorizing  Provider  dipyridamole-aspirin (AGGRENOX) 200-25 MG per 12 hr capsule Take 1 capsule by mouth 2 (two) times daily.    Historical Provider, MD  glipiZIDE (GLUCOTROL) 5 MG tablet Take 0.5 tablets (2.5 mg total) by mouth daily before breakfast. 01/19/14   Erick Blinks, MD  hydrALAZINE (APRESOLINE) 25 MG tablet Take 3 tablets (75 mg total) by mouth every 8 (eight) hours. 02/25/14   Standley Brooking, MD  isosorbide mononitrate (IMDUR) 15 mg TB24 24 hr tablet Take 0.5 tablets (15 mg total) by mouth daily. 01/19/14   Erick Blinks, MD  levETIRAcetam (KEPPRA) 100 MG/ML solution Take 5 mLs by mouth 2 (two) times daily. 01/13/14   Historical Provider, MD  metoprolol succinate (TOPROL-XL) 100 MG 24  hr tablet Take 1 tablet (100 mg total) by mouth daily. Take with or immediately following a meal. 02/25/14   Standley Brooking, MD  pantoprazole (PROTONIX) 40 MG tablet Take 1 tablet (40 mg total) by mouth daily. 01/19/14   Erick Blinks, MD  potassium chloride (MICRO-K) 10 MEQ CR capsule Take 10 mEq by mouth daily.    Historical Provider, MD  pravastatin (PRAVACHOL) 20 MG tablet Take 20 mg by mouth daily.    Historical Provider, MD  sodium bicarbonate 650 MG tablet Take 650 mg by mouth 3 (three) times daily.    Historical Provider, MD  torsemide (DEMADEX) 20 MG tablet Take 1 tablet (20 mg total) by mouth daily. 03/01/14   Antoine Poche, MD  Vitamin D, Ergocalciferol, (DRISDOL) 50000 UNITS CAPS Take 50,000 Units by mouth every 7 (seven) days. Takes on Tuesday    Historical Provider, MD   Triage vitals: BP 152/77  Pulse 84  Temp(Src) 98.6 F (37 C) (Oral)  Resp 16  Ht 5\' 5"  (1.651 m)  Wt 150 lb (68.04 kg)  BMI 24.96 kg/m2  SpO2 96%  Physical Exam  Nursing note and vitals reviewed. Constitutional: She appears well-developed and well-nourished. No distress.  HENT:  Head: Normocephalic and atraumatic.  Eyes: EOM are normal.  Neck: Neck supple. No tracheal deviation present.  Cardiovascular: Normal rate, regular rhythm and normal heart sounds.   Pulmonary/Chest: Effort normal. No respiratory distress.  Crackles at the bases  Abdominal: Soft. There is no tenderness.  Musculoskeletal: Normal range of motion.  asymmetrical pretibial edema right greater than left. Intact peripheral pulses  Neurological: She is alert.  oriented x2 CN 2-12 intact, no ataxia on finger to nose, no nystagmus, 5/5 strength throughout, no pronator drift ?slurred speech per daughter  Skin: Skin is warm and dry.  Psychiatric: She has a normal mood and affect. Her behavior is normal.    ED Course  Procedures (including critical care time)  DIAGNOSTIC STUDIES: Oxygen Saturation is 96% on RA, Adequate by my  interpretation.    COORDINATION OF CARE: 4:36 PM-Discussed treatment plan which includes CT of head, CXR, CBC panel, CMP and UA with pt's daughter at bedside and she agreed to plan.   6:28 PM-Pt rechecked and feels improved. Discussed admission for hyperglycemia with pt and daughter and both are agreeable.   6:49 PM-Consult complete with Dr. Gonzella Lex, hospitalist. Patient case explained and discussed. Dr. Gonzella Lex agrees to admit patient for further evaluation and treatment to tele. Call ended at 6:50 PM.  Labs Review Labs Reviewed  CBC WITH DIFFERENTIAL - Abnormal; Notable for the following:    RBC 3.36 (*)    Hemoglobin 8.8 (*)    HCT 27.9 (*)    RDW 17.2 (*)  All other components within normal limits  COMPREHENSIVE METABOLIC PANEL - Abnormal; Notable for the following:    Glucose, Bld 463 (*)    BUN 56 (*)    Creatinine, Ser 2.79 (*)    Calcium 8.3 (*)    Albumin 2.4 (*)    Total Bilirubin 0.2 (*)    GFR calc non Af Amer 16 (*)    GFR calc Af Amer 18 (*)    All other components within normal limits  BLOOD GAS, VENOUS - Abnormal; Notable for the following:    pCO2, Ven 44.1 (*)    Acid-base deficit 5.3 (*)    All other components within normal limits  PRO B NATRIURETIC PEPTIDE - Abnormal; Notable for the following:    Pro B Natriuretic peptide (BNP) 37134.0 (*)    All other components within normal limits  URINALYSIS, ROUTINE W REFLEX MICROSCOPIC - Abnormal; Notable for the following:    Glucose, UA 250 (*)    Hgb urine dipstick TRACE (*)    Protein, ur 100 (*)    All other components within normal limits  URINE MICROSCOPIC-ADD ON - Abnormal; Notable for the following:    Squamous Epithelial / LPF FEW (*)    Bacteria, UA MANY (*)    All other components within normal limits  BASIC METABOLIC PANEL - Abnormal; Notable for the following:    Potassium 3.0 (*)    BUN 53 (*)    Creatinine, Ser 2.45 (*)    GFR calc non Af Amer 18 (*)    GFR calc Af Amer 21 (*)    All other  components within normal limits  CBC - Abnormal; Notable for the following:    RBC 3.33 (*)    Hemoglobin 8.7 (*)    HCT 27.6 (*)    RDW 17.2 (*)    All other components within normal limits  GLUCOSE, CAPILLARY - Abnormal; Notable for the following:    Glucose-Capillary 264 (*)    All other components within normal limits  GLUCOSE, CAPILLARY - Abnormal; Notable for the following:    Glucose-Capillary 49 (*)    All other components within normal limits  GLUCOSE, CAPILLARY - Abnormal; Notable for the following:    Glucose-Capillary 68 (*)    All other components within normal limits  GLUCOSE, CAPILLARY - Abnormal; Notable for the following:    Glucose-Capillary 113 (*)    All other components within normal limits  GLUCOSE, CAPILLARY - Abnormal; Notable for the following:    Glucose-Capillary 241 (*)    All other components within normal limits  CBG MONITORING, ED - Abnormal; Notable for the following:    Glucose-Capillary 386 (*)    All other components within normal limits  URINE CULTURE  KETONES, QUALITATIVE  TROPONIN I  HEMOGLOBIN A1C  MAGNESIUM    Imaging Review Dg Chest 2 View  03/15/2014   CLINICAL DATA:  Weakness  EXAM: CHEST  2 VIEW  COMPARISON:  February 21, 2014  FINDINGS: There is a 7 x 7 mm nodular opacity in the right upper lobe, not present previously. There is a nearby opacity measuring 1.3 x 1.0 cm more peripherally in the right upper lobe. No other similar-appearing structures are seen. There is trace interstitial edema with cardiomegaly and pulmonary venous hypertension. There is no airspace consolidation. Patient is status post coronary artery bypass grafting. There is no appreciable adenopathy. No bone lesions.  IMPRESSION: 7 x 7 mm nodular opacity right upper lobe. There is also a  1.3 x 1.0 cm right upper lobe opacity. These findings were not seen on previous study. Advise correlation with noncontrast enhanced chest CT to further evaluate these apparent nodules.   There is evidence suggesting a degree of congestive heart failure. No airspace consolidation.   Electronically Signed   By: Lowella Grip M.D.   On: 03/15/2014 17:55   Ct Head Wo Contrast  03/15/2014   CLINICAL DATA:  slurred speech slurred speech  EXAM: CT HEAD WITHOUT CONTRAST  TECHNIQUE: Contiguous axial images were obtained from the base of the skull through the vertex without intravenous contrast.  COMPARISON:  CT HEAD W/O CM dated 01/16/2014  FINDINGS: No acute intracranial abnormality. Specifically, no hemorrhage, hydrocephalus, mass lesion, acute infarction, or significant intracranial injury. No acute calvarial abnormality. Age-appropriate global atrophy. Areas of low-attenuation are appreciated within the subcortical and periventricular white matter regions. Regions of chronic lacunar infarction left basal ganglia . An area of chronic PCA distribution infarction on the right. Basal ganglial calcifications are appreciated. The visualized paranasal sinuses and mastoid air cells are patent.  IMPRESSION: Involutional and chronic changes without acute abnormalities.   Electronically Signed   By: Margaree Mackintosh M.D.   On: 03/15/2014 17:56   US Venous Img Lower Bilateral  03/15/2014   CLINICAL DATA:  Bilateral calf edema  EXAM: BILATERAL LOWER EXTREMITY VENOUS DOPPLER ULTRASOUND  TECHNIQUE: Gray-scale sonography with graded compression, as well as color Doppler and duplex ultrasound were performed to evaluate the lower extremity deep venous systems from the level of the common femoral vein and including the common femoral, femoral, profunda femoral, popliteal and calf veins including the posterior tibial, peroneal and gastrocnemius veins when visible. The superficial great saphenous vein was also interrogated. Spectral Doppler was utilized to evaluate flow at rest and with distal augmentation maneuvers in the common femoral, femoral and popliteal veins.  COMPARISON:  None.  FINDINGS: RIGHT LOWER  EXTREMITY  Common Femoral Vein: No evidence of thrombus. Normal compressibility, respiratory phasicity and response to augmentation.  Saphenofemoral Junction: No evidence of thrombus. Normal compressibility and flow on color Doppler imaging.  Profunda Femoral Vein: No evidence of thrombus. Normal compressibility and flow on color Doppler imaging.  Femoral Vein: No evidence of thrombus. Normal compressibility, respiratory phasicity and response to augmentation.  Popliteal Vein: No evidence of thrombus. Normal compressibility, respiratory phasicity and response to augmentation.  Calf Veins: No evidence of thrombus. Normal compressibility and flow on color Doppler imaging.  Superficial Great Saphenous Vein: No evidence of thrombus. Normal compressibility and flow on color Doppler imaging.  Venous Reflux:  None.  Other Findings:  Edema is noted in the calf  LEFT LOWER EXTREMITY  Common Femoral Vein: No evidence of thrombus. Normal compressibility, respiratory phasicity and response to augmentation.  Saphenofemoral Junction: No evidence of thrombus. Normal compressibility and flow on color Doppler imaging.  Profunda Femoral Vein: No evidence of thrombus. Normal compressibility and flow on color Doppler imaging.  Femoral Vein: No evidence of thrombus. Normal compressibility, respiratory phasicity and response to augmentation.  Popliteal Vein: No evidence of thrombus. Normal compressibility, respiratory phasicity and response to augmentation.  Calf Veins: No evidence of thrombus. Normal compressibility and flow on color Doppler imaging.  Superficial Great Saphenous Vein: No evidence of thrombus. Normal compressibility and flow on color Doppler imaging.  Venous Reflux:  None.  Other Findings:  Edema is noted in the calf  IMPRESSION: No evidence of deep venous thrombosis.   Electronically Signed   By: Linus Mako.D.  On: 03/15/2014 18:09     EKG Interpretation   Date/Time:  Wednesday March 15 2014 16:41:46  EDT Ventricular Rate:  84 PR Interval:  208 QRS Duration: 90 QT Interval:  454 QTC Calculation: 536 R Axis:   28 Text Interpretation:  Sinus rhythm with Premature supraventricular  complexes and with occasional Premature ventricular complexes Nonspecific  T wave abnormality Abnormal ECG Prolonged QT No significant change was  found Confirmed by Wyvonnia Dusky  MD, Oluwatobiloba Martin [phone]) on 03/15/2014 4:53:14 PM      MDM   Final diagnoses:  Hyperglycemia  Renal failure  CHF (congestive heart failure)   patient from home with elevated blood sugar at 480. Daughter reports questionable slurred speech since yesterday. She was recently taken off of her glipizide and metformin and started on tradjenta.   She denies any chest pain or shortness of breath.  Hyperglycemia without evidence of DKA. Anion gap is 12. Evidence of volume overload clinically and on CXR.  No hypoxia.  BNP near baseline. Worsening renal function to 2.8 from 2.4 CT obtained given possible slurred speech report and was negative.  Blood sugar improved to 386.  No DKA but does have acute on chronic CHF and volume overload. Lasix given.  CT chest ordered to evaluate abnormalities on CXR. D/w Dr. Clementeen Graham.  I personally performed the services described in this documentation, which was scribed in my presence. The recorded information has been reviewed and is accurate.      Ezequiel Essex, MD 03/16/14 [phone]

## 2014-03-15 NOTE — H&P (Signed)
[facility] History and Physical  [name] [id number] DOB: Apr 05, 1941 DOA: 03/15/2014  Referring physician: Dr. 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 Daughter reports that for past one week her blood was at home has been persistently above 300 to 350s. She was seen by her PCP 5 days back and her glipizide was switched to trajenda. However patient's fingersticks at home were consistently above 300 and was 460 today. She has also noticed patient to get up more frequently in the night to urinate. Patient denies increased thirst. She has been compliant with diet and her medications. Last A1c in the system it months back was 7.9. Patient denies any change in her weight or appetite, tingling or numbness of her extremities. Denies any blurry vision. patient is never a poor historian.  Daughter reports that she has not noticed any increase in her leg swellings however patient did have some increase dyspnea on exertion and dry cough. Patient denies headache, dizziness, fever, chills, nausea , vomiting, chest pain, palpitations,  abdominal pain, bowel symptoms. Denies change in weight or appetite.  Course in the ED Patient vitals were stable. Blood work showed anemia with hemoglobin at baseline. Chemistry showed worsening renal function with BUN of 56 and creatinine of 2.79. One set of troponin was negative. ProBNP was 37,000 which seems to be about her baseline. UA showed proteinuria and some bacteria. Head  CT was done as patient's reported patient to have some slurred speech to the ED physician and it was unremarkable for acute findings. Doppler ultrasound of the legs was done given increased swelling and was negative for DVT. Chest x-ray was done which showed nodular opacity of right upper lobe mid some degree of CHF. This was followed by CT of the chest without contrast which showed mild CHF with nodular opacity in the right apical lobe possible for alveolar edema versus bronchopneumonia versus scarring versus nodule and recommended followup CT scan. Also showed a possible left renal mass and recommended MRI as outpatient. Patient given a dose of IV Lasix and IV units of aspart insulin and hospitalist called for admission to telemetry.  Review of Systems:  Constitutional: Denies fever, chills, diaphoresis, appetite change and fatigue.  HEENT: Denies photophobia, eye pain,  hearing loss, ear pain, congestion, sore throat, rhinorrhea, sneezing, mouth sores, trouble swallowing, neck pain, Respiratory: Dyspnea on exertion, dry cough, Denies  chest tightness,  and wheezing.   Cardiovascular: Denies chest pain, palpitations, leg swelling.  Gastrointestinal: Denies nausea, vomiting, abdominal pain, diarrhea, constipation, blood in stool and abdominal distention.  Genitourinary: Denies dysuria, urgency, frequency, hematuria, flank pain and difficulty urinating.  Endocrine: Denies Polyuria+, denies polydipsia. Musculoskeletal: Denies myalgias, back pain, joint swelling, arthralgias and gait problem.  Skin: Denies pallor, rash and wound.  Neurological: Denies dizziness, seizures, syncope, weakness, light-headedness, numbness and headaches.  Psychiatric/Behavioral: Denies  confusion,   Past Medical History  Diagnosis Date  . Type 2 diabetes mellitus   . Essential hypertension, benign   . History of stroke     Previously  on Coumadin  . MI, old     Reported 34  . Seizure disorder   . Coronary  atherosclerosis of native coronary artery     Multivessel status post CABG 2002  . History of GI bleed     Erosive gastritis and duodenitis by EGD 2002  . Mixed hyperlipidemia   . Ischemic cardiomyopathy     LVEF 40-45% March 2013  . Dementia   . CKD (chronic kidney disease) stage 4, GFR 15-29 ml/min   . Stroke   . Seizures    Past Surgical History  Procedure Laterality Date  . Coronary artery bypass graft  July 2002    LIMA to LAD, SVG to OM1, SVG to OM 2, SVG to RCA  . Tee without cardioversion  01/28/2012    Procedure: TRANSESOPHAGEAL ECHOCARDIOGRAM (TEE);  Surgeon: Lelon Perla, MD;  Location: [facility] ENDOSCOPY;  Service: Cardiovascular;  Laterality: [name];  . Colonoscopy  2002  . Esophagogastroduodenoscopy  2002  . Esophagogastroduodenoscopy [name] 01/18/2014    YHC:WCBJS hiatal hernia. Abnormal gastric and duodenal bulbar mucosa of uncertain significance - status post gastric bx (chronic gastritis/H.pylori +   Social History:  reports that she has quit smoking. Her smoking use included Cigarettes. She has a 50 pack-year smoking history. She does not have any smokeless tobacco history on file. She reports that she does not drink alcohol or use illicit drugs.  No Known Allergies  Family History  Problem Relation Age of Onset  . Stroke Father   . Diabetes type II Mother   . Colon cancer Neg Hx     Prior to Admission medications   Medication Sig Start Date End Date Taking? Authorizing Provider  aspirin EC 81 MG tablet Take 81 mg by mouth daily.   Yes Historical Provider, MD  dipyridamole-aspirin (AGGRENOX) 200-25 MG per 12 hr capsule Take 1 capsule by mouth 2 (two) times daily.   Yes Historical Provider, MD  donepezil (ARICEPT) 10 MG tablet Take 10 mg by mouth daily. 01/30/14  Yes Historical Provider, MD  folic acid (FOLVITE) 1 MG tablet Take 1 mg by mouth daily.   Yes Historical Provider, MD  furosemide (LASIX) 20 MG tablet Take 20 mg by mouth daily.   Yes Historical Provider, MD   hydrALAZINE (APRESOLINE) 25 MG tablet Take 3 tablets (75 mg total) by mouth every 8 (eight) hours. 02/25/14  Yes Samuella Cota, MD  isosorbide mononitrate (IMDUR) 15 mg TB24 24 hr tablet Take 0.5 tablets (15 mg total) by mouth daily. 01/19/14  Yes Kathie Dike, MD  levETIRAcetam (KEPPRA) 100 MG/ML solution Take 5 mLs by mouth 2 (two) times daily. 01/13/14  Yes Historical Provider, MD  linagliptin (TRADJENTA) 5 MG TABS tablet Take 5 mg by mouth daily.   Yes Historical Provider, MD  metoprolol succinate (TOPROL-XL) 100 MG 24 hr tablet Take 1 tablet (100 mg total) by mouth daily. Take with or immediately following a meal. 02/25/14  Yes Samuella Cota, MD  mupirocin ointment (BACTROBAN) 2 % Apply 1 application topically 2 (two) times daily. 03/13/14  Yes Historical Provider, MD  pantoprazole (PROTONIX) 40 MG tablet Take 1 tablet (40 mg total) by mouth daily. 01/19/14  Yes Kathie Dike, MD  potassium chloride (MICRO-K) 10 MEQ CR capsule Take 10 mEq by mouth daily.   Yes Historical Provider, MD  pravastatin (PRAVACHOL) 20 MG tablet Take 20 mg by mouth daily.   Yes Historical Provider, MD  Probiotic Product (RESTORA PO) Take 1 capsule by mouth daily.  Yes Historical Provider, MD  sodium bicarbonate 650 MG tablet Take 650 mg by mouth 3 (three) times daily.   Yes Historical Provider, MD  torsemide (DEMADEX) 20 MG tablet Take 1 tablet (20 mg total) by mouth daily. 03/01/14  Yes Arnoldo Lenis, MD  Vitamin D, Ergocalciferol, (DRISDOL) 50000 UNITS CAPS Take 50,000 Units by mouth every 30 (thirty) days. Takes on Tuesday   Yes Historical Provider, MD     Physical Exam:  Filed Vitals:   03/15/14 1615 03/15/14 1959 03/15/14 2038  BP: 152/77 148/57 184/93  Pulse: 84 78 77  Temp: 98.6 F (37 C)  98 F (36.7 C)  TempSrc: Oral  Oral  Resp: 16 17 18   Height: 5\' 5"  (1.651 m)    Weight: 68.04 kg (150 lb)    SpO2: 96% 97% 98%    Constitutional: Vital signs reviewed.  Elderly female in no acute  distress. HEENT: no pallor, no icterus, moist oral mucosa, no JVD Cardiovascular: RRR, S1 normal, S2 normal, no MRG, bibasilar crackles Chest: CTAB, no wheezes, rales, scattered rhonchi Abdominal: Soft. Non-tender, non-distended, bowel sounds are normal,  GU: no CVA tenderness Ext: warm, 2+ pitting edema bilaterally (R. >L. ) Neurological: Alert and awake, nonfocal   Labs on Admission:  Basic Metabolic Panel:  Recent Labs Lab 03/15/14 1645  NA 139  K 3.7  CL 105  CO2 22  GLUCOSE 463*  BUN 56*  CREATININE 2.79*  CALCIUM 8.3*   Liver Function Tests:  Recent Labs Lab 03/15/14 1645  AST 24  ALT 19  ALKPHOS 67  BILITOT 0.2*  PROT 7.4  ALBUMIN 2.4*   No results found for this basename: LIPASE, AMYLASE,  in the last 168 hours No results found for this basename: AMMONIA,  in the last 168 hours CBC:  Recent Labs Lab 03/15/14 1645  WBC 4.6  NEUTROABS 3.3  HGB 8.8*  HCT 27.9*  MCV 83.0  PLT 228   Cardiac Enzymes:  Recent Labs Lab 03/15/14 1645  TROPONINI <0.30   BNP: No components found with this basename: POCBNP,  CBG:  Recent Labs Lab 03/15/14 1614  GLUCAP 386*    Radiological Exams on Admission: Dg Chest 2 View  03/15/2014   CLINICAL DATA:  Weakness  EXAM: CHEST  2 VIEW  COMPARISON:  February 21, 2014  FINDINGS: There is a 7 x 7 mm nodular opacity in the right upper lobe, not present previously. There is a nearby opacity measuring 1.3 x 1.0 cm more peripherally in the right upper lobe. No other similar-appearing structures are seen. There is trace interstitial edema with cardiomegaly and pulmonary venous hypertension. There is no airspace consolidation. Patient is status post coronary artery bypass grafting. There is no appreciable adenopathy. No bone lesions.  IMPRESSION: 7 x 7 mm nodular opacity right upper lobe. There is also a 1.3 x 1.0 cm right upper lobe opacity. These findings were not seen on previous study. Advise correlation with noncontrast  enhanced chest CT to further evaluate these apparent nodules.  There is evidence suggesting a degree of congestive heart failure. No airspace consolidation.   Electronically Signed   By: Lowella Grip M.D.   On: 03/15/2014 17:55   Ct Head Wo Contrast  03/15/2014   CLINICAL DATA:  slurred speech slurred speech  EXAM: CT HEAD WITHOUT CONTRAST  TECHNIQUE: Contiguous axial images were obtained from the base of the skull through the vertex without intravenous contrast.  COMPARISON:  CT HEAD W/O CM dated 01/16/2014  FINDINGS:  No acute intracranial abnormality. Specifically, no hemorrhage, hydrocephalus, mass lesion, acute infarction, or significant intracranial injury. No acute calvarial abnormality. Age-appropriate global atrophy. Areas of low-attenuation are appreciated within the subcortical and periventricular white matter regions. Regions of chronic lacunar infarction left basal ganglia . An area of chronic PCA distribution infarction on the right. Basal ganglial calcifications are appreciated. The visualized paranasal sinuses and mastoid air cells are patent.  IMPRESSION: Involutional and chronic changes without acute abnormalities.   Electronically Signed   By: Margaree Mackintosh M.D.   On: 03/15/2014 17:56   Ct Chest Wo Contrast  03/15/2014   CLINICAL DATA:  Pulmonary nodules.  Hyperglycemia.  EXAM: CT CHEST WITHOUT CONTRAST  TECHNIQUE: Multidetector CT imaging of the chest was performed following the standard protocol without IV contrast.  COMPARISON:  DG CHEST 2 VIEW dated 03/15/2014  FINDINGS: Median sternotomy/ CABG. Cardiomegaly is present. Small right dependently layering and trace left pleural effusions are present. Ground-glass attenuation is present in the upper lobes along with interlobular septal thickening compatible with interstitial pulmonary edema.  The nodular densities on the prior chest radiograph represent peripheral pulmonary parenchymal areas of opacity extending to the pleural surface at  the right apex in the right upper lobe. These may represent foci bronchopneumonia or alveolar edema. Underlying pulmonary nodule cannot be excluded.  Incidental imaging of the upper abdomen demonstrates a partially visualized rounded lesion in the interpolar left kidney measuring about 2 cm. Renal mass cannot be excluded. There are also left renal collecting system calculi. A followup renal MRI should be considered for further assessment. Renal ultrasound could also be considered however MRI would be a better definitive test. Non-emergent MRI should be deferred until patient has been discharged for the acute illness, and can optimally cooperate with positioning and breath-holding instructions.  There is no axillary adenopathy. Aortic atherosclerosis. No mediastinal adenopathy.  IMPRESSION: 1. Constellation of findings compatible with mild CHF with small right-greater-than-left bilateral pleural effusions and cardiomegaly. 2. Nodular densities at the right apex on prior plain film may represent alveolar edema, foci of bronchopneumonia, pulmonary parenchymal scarring or pulmonary nodules. Radiographic followup is recommended to assess for clearing after the acute illness. 3. Partially visualized possible left renal mass and left renal calculi. See discussion above. MRI is preferred over renal ultrasound for definitive assessment. Non-emergent MRI should be deferred until patient has been discharged for the acute illness, and can optimally cooperate with positioning and breath-holding instructions.   Electronically Signed   By: Dereck Ligas M.D.   On: 03/15/2014 20:08   US Venous Img Lower Bilateral  03/15/2014   CLINICAL DATA:  Bilateral calf edema  EXAM: BILATERAL LOWER EXTREMITY VENOUS DOPPLER ULTRASOUND  TECHNIQUE: Gray-scale sonography with graded compression, as well as color Doppler and duplex ultrasound were performed to evaluate the lower extremity deep venous systems from the level of the common femoral  vein and including the common femoral, femoral, profunda femoral, popliteal and calf veins including the posterior tibial, peroneal and gastrocnemius veins when visible. The superficial great saphenous vein was also interrogated. Spectral Doppler was utilized to evaluate flow at rest and with distal augmentation maneuvers in the common femoral, femoral and popliteal veins.  COMPARISON:  None.  FINDINGS: RIGHT LOWER EXTREMITY  Common Femoral Vein: No evidence of thrombus. Normal compressibility, respiratory phasicity and response to augmentation.  Saphenofemoral Junction: No evidence of thrombus. Normal compressibility and flow on color Doppler imaging.  Profunda Femoral Vein: No evidence of thrombus. Normal compressibility and flow on color  Doppler imaging.  Femoral Vein: No evidence of thrombus. Normal compressibility, respiratory phasicity and response to augmentation.  Popliteal Vein: No evidence of thrombus. Normal compressibility, respiratory phasicity and response to augmentation.  Calf Veins: No evidence of thrombus. Normal compressibility and flow on color Doppler imaging.  Superficial Great Saphenous Vein: No evidence of thrombus. Normal compressibility and flow on color Doppler imaging.  Venous Reflux:  None.  Other Findings:  Edema is noted in the calf  LEFT LOWER EXTREMITY  Common Femoral Vein: No evidence of thrombus. Normal compressibility, respiratory phasicity and response to augmentation.  Saphenofemoral Junction: No evidence of thrombus. Normal compressibility and flow on color Doppler imaging.  Profunda Femoral Vein: No evidence of thrombus. Normal compressibility and flow on color Doppler imaging.  Femoral Vein: No evidence of thrombus. Normal compressibility, respiratory phasicity and response to augmentation.  Popliteal Vein: No evidence of thrombus. Normal compressibility, respiratory phasicity and response to augmentation.  Calf Veins: No evidence of thrombus. Normal compressibility and flow  on color Doppler imaging.  Superficial Great Saphenous Vein: No evidence of thrombus. Normal compressibility and flow on color Doppler imaging.  Venous Reflux:  None.  Other Findings:  Edema is noted in the calf  IMPRESSION: No evidence of deep venous thrombosis.   Electronically Signed   By: Inez Catalina M.D.   On: 03/15/2014 18:09    EKG: NSR at 84, prolonged QTC.  Assessment/Plan     Principal problem Hyperglycemia with uncontrolled diabetes mellitus No recent A1c in the system. Patient was recently switched to trajenda. Given persistently elevated blood glucose at home she likely has uncontrolled diabetes with an elevated A1c. I will place her on Lantus 12 units at bedtime and follow with sliding scale insulin. Monitor for hypoglycemic symptoms given her underlying renal dysfunction.  Active Problems: Congestive heart failure 2-D echo 2 months back we have a 40% and grade 1 diastolic dysfunction along with severe hypokinesis. Patient does have bibasilar crackles and leg edema however her weight is 150 pounds which seems to be around her dry weight. Chest x-ray and CT scan of the chest does show findings of bilateral pleural effusions. She also has significant bilateral leg edema (R>L). She has chronically elevated proBNP and seems to be at baseline. -Patient received a dose of IV Lasix 40 mg in the ED. I will give her 2 more doses of IV Lasix 40 mg every 12 hours and monitor improvement in symptoms. Daughter does report patient having cough and some shortness of breath but is unable to tell if it has worsened. -Monitor for worsening renal function following IV Lasix. -Monitor I/O. and daily weight. Continue aspirin, metoprolol, Imdur, hydralazine and resume torsemide in a.m.    Community acquired pneumonia Possible right upper lobe bronchopneumonia on chest CT. Daughter does report increased cough over last few days. Place on empiric Rocephin and azithromycin. Monitor QTC. Continue when  necessary albuterol nebs and O2 via nasal cannula. We'll add antitussives. Needs followup chest CT in 4-6 weeks to evaluate for resolution.   AKI on  CKD stage IV Baseline creatinine is around 2.2 upon recent hospitalization. Her creatinine is 2.79 today. Patient receiving few doses of IV Lasix as well. Monitor renal function closely. Urine output.  iron deficiency anemia Hemoglobin at baseline. Iron panel on recent hospitalization suggestive of iron deficiency anemia. We'll add iron supplements.  GERD Continue PPI  ? Left renal mass. Patient denies any abdominal or flank pain. Needs MRI as outpatient.   History of stroke  Continue Aggrenox and statin  Seizure disorder Continue Keppra  ? UTI Ordered a urine culture. On Rocephin which should cover.  Diet: Diabetic  DVT prophylaxis: sq heparin   Code Status: full code Family Communication: discussed with daughter at bedside Disposition Plan: home once improved  Davis Vannatter [facility] Pager [phone]  Total time spent on admission :70 minutes  If 7PM-7AM, please contact night-coverage [email or web address] Password [facility] 03/15/2014, 8:54 PM

## 2014-03-15 NOTE — ED Notes (Signed)
Dr. Rancour at bedside at this time.  

## 2014-03-15 NOTE — ED Notes (Signed)
Pt's daughter brought Pt in due to a high blood sugar reading at home. Daughter reports that the blood sugar was 460 at 3pm. Daughter reports that pt was acting "out of it" and wanted to bring her to the hospital. Daughter reports that pt has had recent "wheezing"  Pt is A&O and in NAD.

## 2014-03-16 ENCOUNTER — Encounter: Payer: Self-pay | Admitting: Cardiology

## 2014-03-16 DIAGNOSIS — E162 Hypoglycemia, unspecified: Secondary | ICD-10-CM

## 2014-03-16 LAB — BASIC METABOLIC PANEL
BUN: 53 mg/dL — ABNORMAL HIGH (ref 6–23)
CHLORIDE: 109 meq/L (ref 96–112)
CO2: 24 mEq/L (ref 19–32)
Calcium: 8.7 mg/dL (ref 8.4–10.5)
Creatinine, Ser: 2.45 mg/dL — ABNORMAL HIGH (ref 0.50–1.10)
GFR, EST AFRICAN AMERICAN: 21 mL/min — AB (ref >90)
GFR, EST NON AFRICAN AMERICAN: 18 mL/min — AB (ref >90)
Glucose, Bld: 76 mg/dL (ref 70–99)
POTASSIUM: 3 meq/L — AB (ref 3.7–5.3)
SODIUM: 145 meq/L (ref 137–147)

## 2014-03-16 LAB — CBC
HEMATOCRIT: 27.6 % — AB (ref 36.0–46.0)
Hemoglobin: 8.7 g/dL — ABNORMAL LOW (ref 12.0–15.0)
MCH: 26.1 pg (ref 26.0–34.0)
MCHC: 31.5 g/dL (ref 30.0–36.0)
MCV: 82.9 fL (ref 78.0–100.0)
Platelets: 250 10*3/uL (ref 150–400)
RBC: 3.33 MIL/uL — AB (ref 3.87–5.11)
RDW: 17.2 % — ABNORMAL HIGH (ref 11.5–15.5)
WBC: 4.3 10*3/uL (ref 4.0–10.5)

## 2014-03-16 LAB — GLUCOSE, CAPILLARY
GLUCOSE-CAPILLARY: 414 mg/dL — AB (ref 70–99)
Glucose-Capillary: 49 mg/dL — ABNORMAL LOW (ref 70–99)
Glucose-Capillary: 68 mg/dL — ABNORMAL LOW (ref 70–99)
Glucose-Capillary: 113 mg/dL — ABNORMAL HIGH (ref 70–99)
Glucose-Capillary: 241 mg/dL — ABNORMAL HIGH (ref 70–99)

## 2014-03-16 LAB — HEMOGLOBIN A1C
HEMOGLOBIN A1C: 10.4 % — AB (ref <5.7)
Mean Plasma Glucose: 252 mg/dL — ABNORMAL HIGH (ref <117)

## 2014-03-16 LAB — MAGNESIUM: Magnesium: 1.9 mg/dL (ref 1.5–2.5)

## 2014-03-16 MED ORDER — POTASSIUM CHLORIDE CRYS ER 20 MEQ PO TBCR
40.0000 meq | EXTENDED_RELEASE_TABLET | ORAL | Status: AC
  Administered 2014-03-16 (×2): 40 meq via ORAL
  Filled 2014-03-16 (×2): qty 2

## 2014-03-16 MED ORDER — FUROSEMIDE 10 MG/ML IJ SOLN
40.0000 mg | Freq: Every day | INTRAMUSCULAR | Status: DC
  Administered 2014-03-16 – 2014-03-18 (×3): 40 mg via INTRAVENOUS
  Filled 2014-03-16 (×4): qty 4

## 2014-03-16 MED ORDER — INSULIN DETEMIR 100 UNIT/ML ~~LOC~~ SOLN
6.0000 [IU] | Freq: Every day | SUBCUTANEOUS | Status: DC
  Administered 2014-03-16 – 2014-03-18 (×2): 6 [IU] via SUBCUTANEOUS
  Filled 2014-03-16 (×4): qty 0.06

## 2014-03-16 MED ORDER — LEVOFLOXACIN IN D5W 750 MG/150ML IV SOLN
750.0000 mg | Freq: Once | INTRAVENOUS | Status: AC
  Administered 2014-03-16: 750 mg via INTRAVENOUS
  Filled 2014-03-16: qty 150

## 2014-03-16 MED ORDER — LEVOFLOXACIN IN D5W 500 MG/100ML IV SOLN
500.0000 mg | INTRAVENOUS | Status: DC
  Administered 2014-03-18: 500 mg via INTRAVENOUS
  Filled 2014-03-16: qty 100

## 2014-03-16 MED ORDER — GLUCOSE 40 % PO GEL
ORAL | Status: AC
  Filled 2014-03-16: qty 1

## 2014-03-16 MED ORDER — GLUCOSE 40 % PO GEL
1.0000 | Freq: Once | ORAL | Status: AC
  Administered 2014-03-16: 37.5 g via ORAL

## 2014-03-16 MED ORDER — LACTINEX PO CHEW
1.0000 | CHEWABLE_TABLET | Freq: Three times a day (TID) | ORAL | Status: DC
Start: 2014-03-16 — End: 2014-03-18
  Administered 2014-03-16 – 2014-03-18 (×8): 1 via ORAL
  Filled 2014-03-16 (×14): qty 1

## 2014-03-16 MED ORDER — INSULIN ASPART 100 UNIT/ML ~~LOC~~ SOLN
0.0000 [IU] | Freq: Three times a day (TID) | SUBCUTANEOUS | Status: DC
  Administered 2014-03-16: 9 [IU] via SUBCUTANEOUS
  Administered 2014-03-17: 3 [IU] via SUBCUTANEOUS
  Administered 2014-03-17: 1 [IU] via SUBCUTANEOUS
  Administered 2014-03-17: 5 [IU] via SUBCUTANEOUS
  Administered 2014-03-18 (×2): 3 [IU] via SUBCUTANEOUS

## 2014-03-16 NOTE — Plan of Care (Signed)
1700 BS was 414 - Glucometer did not transfer to chart?

## 2014-03-16 NOTE — Progress Notes (Signed)
Inpatient Diabetes Program Recommendations  AACE/ADA: New Consensus Statement on Inpatient Glycemic Control (2013)  Target Ranges:  Prepandial:   less than 140 mg/dL      Peak postprandial:   less than 180 mg/dL (1-2 hours)      Critically ill patients:  140 - 180 mg/dL   Results for [name], [name] (MRN [id number]) as of 03/16/2014 14:13  Ref. Range 02/25/2014 11:30 02/25/2014 15:59 03/15/2014 16:14 03/15/2014 21:08 03/16/2014 07:30 03/16/2014 08:17 03/16/2014 08:54 03/16/2014 11:23  Glucose-Capillary Latest Range: 70-99 mg/dL 190 (H) 195 (H) 386 (H) 264 (H) 68 (L) 49 (L) 113 (H) 241 (H)   Diabetes history: DM2 Outpatient Diabetes medications: Tradjenta 5 mg daily Current orders for Inpatient glycemic control: Levemir 6 units QHS (decreased from 12 units QHS this morning)  Inpatient Diabetes Program Recommendations Correction (SSI): Please consider ordering Novolog sensitive correction scale ACHS.  Note: Patient received Levemir 12 units last night and fasting glucose 68 mg/dl then 49 mg/dl this morning.  Therefore, Levemir was decreased to 6 units QHS and Novolog moderate correction scale was discontinued.  Glucose up to 241 mg/dl at 11:23. Please consider ordering Novolog sensitive correction scale.  Thanks, Barnie Alderman, RN, MSN, CCRN Diabetes Coordinator Inpatient Diabetes Program [phone] (Team Pager) [phone] ([place] office) [phone] [facility] office)

## 2014-03-16 NOTE — Progress Notes (Signed)
[facility] PROGRESS NOTE  [name] [id number] DOB: March 16, 1941 DOA: 03/15/2014 PCP: [name], MD  Assessment/Plan: Hyperglycemia -It appears was overcorrected and now was hypoglycemic this am. -Will cut levemir dose in half (6 units), will restart a sensitive SSI without meal coverage for now.  Acute on Chronic Combined CHF -Continue lasix. -Strive for negative fluid balance. -No current oxygen requirements. -Still has significant LE edema on exam.  CAP -Has an inflitrate on chest CT, but no clinical signs of PNA. -Will transition to PO antibiotics for a total of 7 days.  Acute on CKD Stage IV -Baseline cr around 2.2. -Cr nearing baseline.  Hypokalemia -replete PO. -Check Mag level. -Likely related to diuresis.  Iron Deficiency Anemia -Continue iron supplementation.  ?UTI -Urine cx pending (rocephin and now levaquin should cover).  ??Left Renal Mass -Will need a follow up MRI as an OP.  Seizure Disorder -Continue Keppra.  Code Status: Full Code Family Communication: Patient only  Disposition Plan: Home when ready   Consultants:  None   Antibiotics:  Levaquin   Subjective: No complaints today. Feels much better.  Objective: Filed Vitals:   03/15/14 2038 03/16/14 0356 03/16/14 0918 03/16/14 1128  BP: 184/93 166/84  172/82  Pulse: 77 71 72   Temp: 98 F (36.7 C) 97.8 F (36.6 C)    TempSrc: Oral Oral    Resp: 18 18 18    Height:      Weight: 68.085 kg (150 lb 1.6 oz)     SpO2: 98% 97% 97%     Intake/Output Summary (Last 24 hours) at 03/16/14 1250 Last data filed at 03/16/14 0900  Gross per 24 hour  Intake    480 ml  Output    600 ml  Net   -120 ml   Filed Weights   03/15/14 1615 03/15/14 2038  Weight: 68.04 kg (150 lb) 68.085 kg (150 lb 1.6 oz)    Exam:   General:  AA Ox3  Cardiovascular: RRR  Respiratory: CTA B  Abdomen: S/NT/ND/+BS  Extremities: 3+edema bilaterally   Neurologic:   Non-focal  Data Reviewed: Basic Metabolic Panel:  Recent Labs Lab 03/15/14 1645 03/16/14 0545  NA 139 145  K 3.7 3.0*  CL 105 109  CO2 22 24  GLUCOSE 463* 76  BUN 56* 53*  CREATININE 2.79* 2.45*  CALCIUM 8.3* 8.7   Liver Function Tests:  Recent Labs Lab 03/15/14 1645  AST 24  ALT 19  ALKPHOS 67  BILITOT 0.2*  PROT 7.4  ALBUMIN 2.4*   No results found for this basename: LIPASE, AMYLASE,  in the last 168 hours No results found for this basename: AMMONIA,  in the last 168 hours CBC:  Recent Labs Lab 03/15/14 1645 03/16/14 0545  WBC 4.6 4.3  NEUTROABS 3.3  --   HGB 8.8* 8.7*  HCT 27.9* 27.6*  MCV 83.0 82.9  PLT 228 250   Cardiac Enzymes:  Recent Labs Lab 03/15/14 1645  TROPONINI <0.30   BNP (last 3 results)  Recent Labs  02/21/14 1230 02/22/14 0226 03/15/14 1645  PROBNP 35849.0* 38775.0* 37134.0*   CBG:  Recent Labs Lab 03/15/14 2108 03/16/14 0730 03/16/14 0817 03/16/14 0854 03/16/14 1123  GLUCAP 264* 68* 49* 113* 241*    No results found for this or any previous visit (from the past 240 hour(s)).   Studies: Dg Chest 2 View  03/15/2014   CLINICAL DATA:  Weakness  EXAM: CHEST  2 VIEW  COMPARISON:  February 21, 2014  FINDINGS: There is a 7 x 7 mm nodular opacity in the right upper lobe, not present previously. There is a nearby opacity measuring 1.3 x 1.0 cm more peripherally in the right upper lobe. No other similar-appearing structures are seen. There is trace interstitial edema with cardiomegaly and pulmonary venous hypertension. There is no airspace consolidation. Patient is status post coronary artery bypass grafting. There is no appreciable adenopathy. No bone lesions.  IMPRESSION: 7 x 7 mm nodular opacity right upper lobe. There is also a 1.3 x 1.0 cm right upper lobe opacity. These findings were not seen on previous study. Advise correlation with noncontrast enhanced chest CT to further evaluate these apparent nodules.  There is evidence  suggesting a degree of congestive heart failure. No airspace consolidation.   Electronically Signed   By: Lowella Grip M.D.   On: 03/15/2014 17:55   Ct Head Wo Contrast  03/15/2014   CLINICAL DATA:  slurred speech slurred speech  EXAM: CT HEAD WITHOUT CONTRAST  TECHNIQUE: Contiguous axial images were obtained from the base of the skull through the vertex without intravenous contrast.  COMPARISON:  CT HEAD W/O CM dated 01/16/2014  FINDINGS: No acute intracranial abnormality. Specifically, no hemorrhage, hydrocephalus, mass lesion, acute infarction, or significant intracranial injury. No acute calvarial abnormality. Age-appropriate global atrophy. Areas of low-attenuation are appreciated within the subcortical and periventricular white matter regions. Regions of chronic lacunar infarction left basal ganglia . An area of chronic PCA distribution infarction on the right. Basal ganglial calcifications are appreciated. The visualized paranasal sinuses and mastoid air cells are patent.  IMPRESSION: Involutional and chronic changes without acute abnormalities.   Electronically Signed   By: Margaree Mackintosh M.D.   On: 03/15/2014 17:56   Ct Chest Wo Contrast  03/15/2014   CLINICAL DATA:  Pulmonary nodules.  Hyperglycemia.  EXAM: CT CHEST WITHOUT CONTRAST  TECHNIQUE: Multidetector CT imaging of the chest was performed following the standard protocol without IV contrast.  COMPARISON:  DG CHEST 2 VIEW dated 03/15/2014  FINDINGS: Median sternotomy/ CABG. Cardiomegaly is present. Small right dependently layering and trace left pleural effusions are present. Ground-glass attenuation is present in the upper lobes along with interlobular septal thickening compatible with interstitial pulmonary edema.  The nodular densities on the prior chest radiograph represent peripheral pulmonary parenchymal areas of opacity extending to the pleural surface at the right apex in the right upper lobe. These may represent foci bronchopneumonia  or alveolar edema. Underlying pulmonary nodule cannot be excluded.  Incidental imaging of the upper abdomen demonstrates a partially visualized rounded lesion in the interpolar left kidney measuring about 2 cm. Renal mass cannot be excluded. There are also left renal collecting system calculi. A followup renal MRI should be considered for further assessment. Renal ultrasound could also be considered however MRI would be a better definitive test. Non-emergent MRI should be deferred until patient has been discharged for the acute illness, and can optimally cooperate with positioning and breath-holding instructions.  There is no axillary adenopathy. Aortic atherosclerosis. No mediastinal adenopathy.  IMPRESSION: 1. Constellation of findings compatible with mild CHF with small right-greater-than-left bilateral pleural effusions and cardiomegaly. 2. Nodular densities at the right apex on prior plain film may represent alveolar edema, foci of bronchopneumonia, pulmonary parenchymal scarring or pulmonary nodules. Radiographic followup is recommended to assess for clearing after the acute illness. 3. Partially visualized possible left renal mass and left renal calculi. See discussion above. MRI is preferred over renal ultrasound for definitive assessment. Non-emergent  MRI should be deferred until patient has been discharged for the acute illness, and can optimally cooperate with positioning and breath-holding instructions.   Electronically Signed   By: Dereck Ligas M.D.   On: 03/15/2014 20:08   US Venous Img Lower Bilateral  03/15/2014   CLINICAL DATA:  Bilateral calf edema  EXAM: BILATERAL LOWER EXTREMITY VENOUS DOPPLER ULTRASOUND  TECHNIQUE: Gray-scale sonography with graded compression, as well as color Doppler and duplex ultrasound were performed to evaluate the lower extremity deep venous systems from the level of the common femoral vein and including the common femoral, femoral, profunda femoral, popliteal and  calf veins including the posterior tibial, peroneal and gastrocnemius veins when visible. The superficial great saphenous vein was also interrogated. Spectral Doppler was utilized to evaluate flow at rest and with distal augmentation maneuvers in the common femoral, femoral and popliteal veins.  COMPARISON:  None.  FINDINGS: RIGHT LOWER EXTREMITY  Common Femoral Vein: No evidence of thrombus. Normal compressibility, respiratory phasicity and response to augmentation.  Saphenofemoral Junction: No evidence of thrombus. Normal compressibility and flow on color Doppler imaging.  Profunda Femoral Vein: No evidence of thrombus. Normal compressibility and flow on color Doppler imaging.  Femoral Vein: No evidence of thrombus. Normal compressibility, respiratory phasicity and response to augmentation.  Popliteal Vein: No evidence of thrombus. Normal compressibility, respiratory phasicity and response to augmentation.  Calf Veins: No evidence of thrombus. Normal compressibility and flow on color Doppler imaging.  Superficial Great Saphenous Vein: No evidence of thrombus. Normal compressibility and flow on color Doppler imaging.  Venous Reflux:  None.  Other Findings:  Edema is noted in the calf  LEFT LOWER EXTREMITY  Common Femoral Vein: No evidence of thrombus. Normal compressibility, respiratory phasicity and response to augmentation.  Saphenofemoral Junction: No evidence of thrombus. Normal compressibility and flow on color Doppler imaging.  Profunda Femoral Vein: No evidence of thrombus. Normal compressibility and flow on color Doppler imaging.  Femoral Vein: No evidence of thrombus. Normal compressibility, respiratory phasicity and response to augmentation.  Popliteal Vein: No evidence of thrombus. Normal compressibility, respiratory phasicity and response to augmentation.  Calf Veins: No evidence of thrombus. Normal compressibility and flow on color Doppler imaging.  Superficial Great Saphenous Vein: No evidence of  thrombus. Normal compressibility and flow on color Doppler imaging.  Venous Reflux:  None.  Other Findings:  Edema is noted in the calf  IMPRESSION: No evidence of deep venous thrombosis.   Electronically Signed   By: Inez Catalina M.D.   On: 03/15/2014 18:09    Scheduled Meds: . aspirin EC  81 mg Oral Daily  . azithromycin  500 mg Oral Daily  . cefTRIAXone (ROCEPHIN)  IV  1 g Intravenous Q24H  . dextrose      . dipyridamole-aspirin  1 capsule Oral BID  . donepezil  10 mg Oral Daily  . ferrous gluconate  324 mg Oral BID WC  . folic acid  1 mg Oral Daily  . furosemide  40 mg Intravenous BID  . heparin  5,000 Units Subcutaneous 3 times per day  . hydrALAZINE  75 mg Oral 3 times per day  . insulin detemir  6 Units Subcutaneous QHS  . isosorbide mononitrate  15 mg Oral Daily  . lactobacillus acidophilus & bulgar  1 tablet Oral TID WC  . levETIRAcetam  500 mg Oral BID  . metoprolol succinate  100 mg Oral Daily  . mupirocin ointment  1 application Topical BID  . pantoprazole  40  mg Oral Daily  . potassium chloride  10 mEq Oral Daily  . sodium bicarbonate  650 mg Oral TID  . sodium chloride  3 mL Intravenous Q12H  . [START ON 03/17/2014] torsemide  20 mg Oral Daily  . [START ON 03/21/2014] Vitamin D (Ergocalciferol)  50,000 Units Oral Q30 days   Continuous Infusions:   Principal Problem:   Hyperglycemia Active Problems:   Hypertension   Diabetes mellitus   Seizure disorder   ARF (acute renal failure)   Acute on chronic renal failure   Bilateral lower extremity edema   Anemia   Systolic CHF, chronic last EF 40% 3/13   Chronic kidney disease (CKD), stage IV (severe)   CAP (community acquired pneumonia)   Hypoglycemia    Time spent: 35 minutes. Greater than 50% of this time was spent in direct contact with the patient coordinating care.    [facility] Pager [phone]  If 7PM-7AM, please contact night-coverage at [email or web address], password  [facility] 03/16/2014, 12:50 PM  LOS: 1 day

## 2014-03-16 NOTE — Care Management Note (Signed)
    Page 1 of 1   03/16/2014     4:02:25 PM CARE MANAGEMENT NOTE 03/16/2014  Patient:  DONNAE, MICHELS   Account Number:  [id number]  Date Initiated:  03/16/2014  Documentation initiated by:  Claretha Cooper  Subjective/Objective Assessment:   Pt lives with [name]. Currently she is active with [facility] RN and PT. Daughter plans on taking pt back home with resumption of HH     Action/Plan:   Anticipated DC Date:  03/18/2014   Anticipated DC Plan:  [place]  CM consult      [facility] Choice  Resumption Of Svcs/PTA Provider   Choice offered to / List presented to:             [place].   Status of service:   Medicare Important Message given?   (If response is "NO", the following Medicare IM given date fields will be blank) Date Medicare IM given:   Date Additional Medicare IM given:    Discharge Disposition:    Per UR Regulation:    If discussed at Long Length of Stay Meetings, dates discussed:    Comments:  03/16/14 Claretha Cooper RN BSN CM

## 2014-03-16 NOTE — Progress Notes (Signed)
Utilization Review Complete  

## 2014-03-16 NOTE — Evaluation (Signed)
Clinical/Bedside Swallow Evaluation  Patient Details  Name: [name] MRN: [id number] Date of Birth: 1941-04-17  Today's Date: 03/16/2014 Time: 4580-9983 SLP Time Calculation (min): 28 min  Past Medical History:  Past Medical History  Diagnosis Date  . Type 2 diabetes mellitus   . Essential hypertension, benign   . History of stroke     Previously on Coumadin  . MI, old     Reported 27  . Seizure disorder   . Coronary atherosclerosis of native coronary artery     Multivessel status post CABG 2002  . History of GI bleed     Erosive gastritis and duodenitis by EGD 2002  . Mixed hyperlipidemia   . Ischemic cardiomyopathy     LVEF 40-45% March 2013  . Dementia   . CKD (chronic kidney disease) stage 4, GFR 15-29 ml/min   . Stroke   . Seizures    Past Surgical History:  Past Surgical History  Procedure Laterality Date  . Coronary artery bypass graft  July 2002    LIMA to LAD, SVG to OM1, SVG to OM 2, SVG to RCA  . Tee without cardioversion  01/28/2012    Procedure: TRANSESOPHAGEAL ECHOCARDIOGRAM (TEE);  Surgeon: Lelon Perla, MD;  Location: [facility] ENDOSCOPY;  Service: Cardiovascular;  Laterality: [name];  . Colonoscopy  2002  . Esophagogastroduodenoscopy  2002  . Esophagogastroduodenoscopy [name] 01/18/2014    JAS:NKNLZ hiatal hernia. Abnormal gastric and duodenal bulbar mucosa of uncertain significance - status post gastric bx (chronic gastritis/H.pylori +   HPI:  73 year old female with history of systolic CHF with EF of 40 % on recent echo, history of CVA, seizure disorder, chronic kidney disease stage IV with baseline creatinine of 2.2 , diabetes mellitus on oral hypoglycemic, iron deficiency anemia , 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 Daughter reports that for past one week her blood was at home has been persistently above 300 to 350s. She was seen by her PCP 5  days back and her glipizide was switched to trajenda. However patient's fingersticks at home were consistently above 300 and was 460 today. She has also noticed patient to get up more frequently in the night to urinate. Patient denies increased thirst. She has been compliant with diet and her medications. Last A1c in the system it months back was 7.9. Patient denies any change in her weight or appetite, tingling or numbness of her extremities.    Assessment / Plan / Recommendation Clinical Impression  [name] was pleasant and cooperative for clinical swallow evaluation. She denies difficulties with swallowing, no family present for evaluation. Pt has residual oral weakness from previous stroke in 2013, but functional for speech and po intake. Pt shows no outward s/sx of aspiration clinically. If silent aspiration is suspected, MBSS can be completed in light of chest x-ray. Continue diet as ordered and SLP will follow up for diet tolerance x1.    Aspiration Risk  Mild    Diet Recommendation Regular;Thin liquid   Liquid Administration via: Cup;Straw Medication Administration: Whole meds with puree Supervision: Intermittent supervision to cue for compensatory strategies Compensations: Slow rate Postural Changes and/or Swallow Maneuvers: Seated upright 90 degrees;Upright 30-60 min after meal    Other  Recommendations Oral Care Recommendations: Oral care BID Other Recommendations: Clarify dietary restrictions   Follow Up Recommendations  24 hour supervision/assistance    Frequency and Duration min 1 x/week  1 week     Swallow Study Prior Functional Status   Lives at home with daughter    General Date of Onset: 03/15/14 HPI: 73 year old female with history of systolic CHF with EF of 40 % on recent echo, history of CVA, seizure disorder, chronic kidney disease stage IV with baseline creatinine of 2.2 , diabetes mellitus on oral hypoglycemic, iron deficiency anemia , dementia likely  vascular, CAD status post CABG who was brought to the hospital by her daughter today for persistently elevated blood glucose at home. Daughter reports that for past one week her blood was at home has been persistently above 300 to 350s. She was seen by her PCP 5 days back and her glipizide was switched to trajenda. However patient's fingersticks at home were consistently above 300 and was 460 today. She has also noticed patient to get up more frequently in the night to urinate. Patient denies increased thirst. She has been compliant with diet and her medications. Last A1c in the system it months back was 7.9. Patient denies any change in her weight or appetite, tingling or numbness of her extremities.  Type of Study: Bedside swallow evaluation Previous Swallow Assessment: MBSS at [facility] 01/29/12 D3/thin s/p stroke Diet Prior to this Study: Regular;Thin liquids Temperature Spikes Noted: No Respiratory Status: Room air History of Recent Intubation: No Behavior/Cognition: Alert;Cooperative;Pleasant mood Oral Cavity - Dentition: Poor condition Self-Feeding Abilities: Able to feed self Patient Positioning: Upright in bed Baseline Vocal Quality: Clear Volitional Cough: Weak Volitional Swallow:  (very delayed)    Oral/Motor/Sensory Function Overall Oral Motor/Sensory Function: Impaired at baseline Labial ROM: Reduced right;Reduced left Labial Symmetry: Within Functional Limits Labial Strength: Reduced   Ice Chips Ice chips: Within functional limits Presentation: Spoon   Thin Liquid Thin Liquid: Within functional limits Presentation: Cup;Straw;Self Fed    Nectar Thick Nectar Thick Liquid: Not tested   Honey Thick Honey Thick Liquid: Not tested   Puree Puree: Within functional limits Presentation: Spoon   Solid       Solid: Within functional limits Presentation: Self Fed      Thank you,  Genene Churn, [place]  Ephraim Hamburger 03/16/2014,3:24 PM

## 2014-03-16 NOTE — Plan of Care (Addendum)
Pt BS was 68 at 0730.  Juice given.  Rechecked at 0815 and BS was 49.  Juice X2 given, plus glutose 15.  Pt appears to be asymptomatic.  Rechecked BS now 113.  Dr. Ree Kida.

## 2014-03-16 NOTE — Progress Notes (Signed)
ANTIBIOTIC CONSULT NOTE - INITIAL  Pharmacy Consult for Levaquin Indication: pneumonia  No Known Allergies  Patient Measurements: Height: 5\' 5"  (165.1 cm) Weight: 150 lb 1.6 oz (68.085 kg) IBW/kg (Calculated) : 57 Adjusted Body Weight:   Vital Signs: Temp: 97.8 F (36.6 C) (04/23 0356) Temp src: Oral (04/23 0356) BP: 172/82 mmHg (04/23 1128) Pulse Rate: 72 (04/23 0918) Intake/Output from previous day: 04/22 0701 - 04/23 0700 In: 240 [P.O.:240] Out: 600 [Urine:600] Intake/Output from this shift: Total I/O In: 240 [P.O.:240] Out: -   Labs:  Recent Labs  03/15/14 1645 03/16/14 0545  WBC 4.6 4.3  HGB 8.8* 8.7*  PLT 228 250  CREATININE 2.79* 2.45*   Estimated Creatinine Clearance: 18.4 ml/min (by C-G formula based on Cr of 2.45). No results found for this basename: VANCOTROUGH, Corlis Leak, VANCORANDOM, [place], GENTPEAK, GENTRANDOM, TOBRATROUGH, TOBRAPEAK, TOBRARND, AMIKACINPEAK, AMIKACINTROU, AMIKACIN,  in the last 72 hours   Microbiology: Recent Results (from the past 720 hour(s))  CLOSTRIDIUM DIFFICILE BY PCR     Status: None   Collection Time    02/20/14  4:30 AM      Result Value Ref Range Status   C difficile by pcr Not Detected  Not Detected Final   Comment:       This assay detects the presence of Clostridium difficile DNA coding     for toxin B (tcdB) by real-time polymerase chain reaction (PCR)     amplification.     This test was developed and its performance characteristics have been     determined by [employer]. Performance characteristics refer     to the analytical performance of the test. This test has not been     cleared or approved by the [place] Food and Drug Administration. The FDA     has determined that such clearance or approval is not necessary. This     laboratory is certified under the [place] as qualified to perform high complexity clinical     laboratory testing.    Medical  History: Past Medical History  Diagnosis Date  . Type 2 diabetes mellitus   . Essential hypertension, benign   . History of stroke     Previously on Coumadin  . MI, old     Reported 57  . Seizure disorder   . Coronary atherosclerosis of native coronary artery     Multivessel status post CABG 2002  . History of GI bleed     Erosive gastritis and duodenitis by EGD 2002  . Mixed hyperlipidemia   . Ischemic cardiomyopathy     LVEF 40-45% March 2013  . Dementia   . CKD (chronic kidney disease) stage 4, GFR 15-29 ml/min   . Stroke   . Seizures     Medications:  Scheduled:  . aspirin EC  81 mg Oral Daily  . dextrose      . dipyridamole-aspirin  1 capsule Oral BID  . donepezil  10 mg Oral Daily  . ferrous gluconate  324 mg Oral BID WC  . folic acid  1 mg Oral Daily  . furosemide  40 mg Intravenous Daily  . heparin  5,000 Units Subcutaneous 3 times per day  . hydrALAZINE  75 mg Oral 3 times per day  . insulin aspart  0-9 Units Subcutaneous TID WC  . insulin detemir  6 Units Subcutaneous QHS  . isosorbide mononitrate  15 mg Oral Daily  . lactobacillus acidophilus &  bulgar  1 tablet Oral TID WC  . levETIRAcetam  500 mg Oral BID  . [START ON 03/18/2014] levofloxacin (LEVAQUIN) IV  500 mg Intravenous Q48H  . levofloxacin (LEVAQUIN) IV  750 mg Intravenous Once  . metoprolol succinate  100 mg Oral Daily  . mupirocin ointment  1 application Topical BID  . pantoprazole  40 mg Oral Daily  . potassium chloride  10 mEq Oral Daily  . potassium chloride  40 mEq Oral Q4H  . sodium bicarbonate  650 mg Oral TID  . sodium chloride  3 mL Intravenous Q12H  . [START ON 03/17/2014] torsemide  20 mg Oral Daily  . [START ON 03/21/2014] Vitamin D (Ergocalciferol)  50,000 Units Oral Q30 days   Assessment: Levaquin for CAP Reduced renal function   Goal of Therapy:  Eradicate infection  Plan:  Levaquin 750 mg IV today, then 500 mg IV every 48 hours Monitor renal function F/U change to po  when appropriate  Corrissa Martello [employer] 03/16/2014,1:17 PM

## 2014-03-16 NOTE — Plan of Care (Signed)
Doctor informed about pt's lunch BS of 241.  Gave verbal orders to not give coverage till >300. Novolog sliding scale DCd for now - due to low BS this AM.

## 2014-03-17 LAB — GLUCOSE, CAPILLARY
GLUCOSE-CAPILLARY: 133 mg/dL — AB (ref 70–99)
GLUCOSE-CAPILLARY: 207 mg/dL — AB (ref 70–99)
Glucose-Capillary: 155 mg/dL — ABNORMAL HIGH (ref 70–99)
Glucose-Capillary: 283 mg/dL — ABNORMAL HIGH (ref 70–99)
Glucose-Capillary: 329 mg/dL — ABNORMAL HIGH (ref 70–99)

## 2014-03-17 LAB — BASIC METABOLIC PANEL
BUN: 49 mg/dL — AB (ref 6–23)
CALCIUM: 8.6 mg/dL (ref 8.4–10.5)
CO2: 21 mEq/L (ref 19–32)
Chloride: 110 mEq/L (ref 96–112)
Creatinine, Ser: 2.65 mg/dL — ABNORMAL HIGH (ref 0.50–1.10)
GFR, EST AFRICAN AMERICAN: 19 mL/min — AB (ref >90)
GFR, EST NON AFRICAN AMERICAN: 17 mL/min — AB (ref >90)
GLUCOSE: 138 mg/dL — AB (ref 70–99)
POTASSIUM: 4.3 meq/L (ref 3.7–5.3)
Sodium: 143 mEq/L (ref 137–147)

## 2014-03-17 LAB — CBC
HCT: 26 % — ABNORMAL LOW (ref 36.0–46.0)
HEMOGLOBIN: 8.1 g/dL — AB (ref 12.0–15.0)
MCH: 26 pg (ref 26.0–34.0)
MCHC: 31.2 g/dL (ref 30.0–36.0)
MCV: 83.3 fL (ref 78.0–100.0)
Platelets: 221 10*3/uL (ref 150–400)
RBC: 3.12 MIL/uL — ABNORMAL LOW (ref 3.87–5.11)
RDW: 17 % — ABNORMAL HIGH (ref 11.5–15.5)
WBC: 3.5 10*3/uL — ABNORMAL LOW (ref 4.0–10.5)

## 2014-03-17 LAB — URINE CULTURE
COLONY COUNT: NO GROWTH
CULTURE: NO GROWTH

## 2014-03-17 NOTE — Progress Notes (Signed)
Speech Language Pathology Treatment:    Patient Details Name: [name] MRN: [id number] DOB: [birth year] Today's Date: 03/17/2014 Time:  2:00 pm to 225 pm   Assessment / Plan / Recommendation Clinical Impression  Alert and cooperative   Good intake reported with no episodes noted or reported   RN reports difficulty with med adm this a.m. With pt holding pills in mouth for long time before swallowing   Assessed and found whole pill placed in pudding works best for med Probation officer to follow up   HPI No new concerns      SLP Plan  All goals met    Recommendations Liquids provided via: Cup;Straw Medication Administration: Whole meds with puree Supervision: Patient able to self feed Postural Changes and/or Swallow Maneuvers: Seated upright 90 degrees;Upright 30-60 min after meal             Oral Care Recommendations: Oral care BID Plan: All goals met    GO   Pollyann Glen 03/17/2014, 2:47 PM

## 2014-03-17 NOTE — Progress Notes (Signed)
[facility] PROGRESS NOTE  [name] EHU:[id number] DOB: [birth year] DOA: 03/15/2014 PCP: [name], MD  Assessment/Plan: Hyperglycemia -Better controlled. -No longer hypoglycemic.  Acute on Chronic Combined CHF -Continue lasix. -Strive for negative fluid balance (? Adequate documentation of Is and Os?) -No current oxygen requirements. -Still has significant LE edema on exam.  CAP -Has an inflitrate on chest CT, but no clinical signs of PNA. -Will transition to PO antibiotics for a total of 7 days.  Acute on CKD Stage IV -Baseline cr around 2.2. -Cr nearing baseline.  Hypokalemia -repleted. -Mg ok at 1.9. -Likely related to diuresis.  Iron Deficiency Anemia -Continue iron supplementation.  ?UTI -Urine cx with no growth.  ??Left Renal Mass -Will need a follow up MRI as an OP.  Seizure Disorder -Continue Keppra.  Code Status: Full Code Family Communication: Patient only  Disposition Plan: Home when ready   Consultants:  None   Antibiotics:  Levaquin   Subjective: No complaints today. Feels much better.  Objective: Filed Vitals:   03/16/14 2152 03/17/14 0500 03/17/14 0930 03/17/14 1504  BP: 151/62  137/73 123/72  Pulse: 80  79 77  Temp: 97.7 F (36.5 C)   97.8 F (36.6 C)  TempSrc: Oral   Oral  Resp: 15   16  Height:      Weight:  66.9 kg (147 lb 7.8 oz)    SpO2: 99%   100%    Intake/Output Summary (Last 24 hours) at 03/17/14 1634 Last data filed at 03/17/14 1200  Gross per 24 hour  Intake    480 ml  Output      0 ml  Net    480 ml   Filed Weights   03/15/14 1615 03/15/14 2038 03/17/14 0500  Weight: 68.04 kg (150 lb) 68.085 kg (150 lb 1.6 oz) 66.9 kg (147 lb 7.8 oz)    Exam:   General:  AA Ox3  Cardiovascular: RRR  Respiratory: CTA B  Abdomen: S/NT/ND/+BS  Extremities: 3+edema bilaterally   Neurologic:  Non-focal  Data Reviewed: Basic Metabolic Panel:  Recent Labs Lab 03/15/14 1645 03/16/14 0545  03/17/14 0515  NA 139 145 143  K 3.7 3.0* 4.3  CL 105 109 110  CO2 22 24 21   GLUCOSE 463* 76 138*  BUN 56* 53* 49*  CREATININE 2.79* 2.45* 2.65*  CALCIUM 8.3* 8.7 8.6  MG  --  1.9  --    Liver Function Tests:  Recent Labs Lab 03/15/14 1645  AST 24  ALT 19  ALKPHOS 67  BILITOT 0.2*  PROT 7.4  ALBUMIN 2.4*   No results found for this basename: LIPASE, AMYLASE,  in the last 168 hours No results found for this basename: AMMONIA,  in the last 168 hours CBC:  Recent Labs Lab 03/15/14 1645 03/16/14 0545 03/17/14 0515  WBC 4.6 4.3 3.5*  NEUTROABS 3.3  --   --   HGB 8.8* 8.7* 8.1*  HCT 27.9* 27.6* 26.0*  MCV 83.0 82.9 83.3  PLT 228 250 221   Cardiac Enzymes:  Recent Labs Lab 03/15/14 1645  TROPONINI <0.30   BNP (last 3 results)  Recent Labs  02/21/14 1230 02/22/14 0226 03/15/14 1645  PROBNP 35849.0* 38775.0* 37134.0*   CBG:  Recent Labs Lab 03/16/14 1657 03/16/14 2147 03/17/14 0716 03/17/14 1104 03/17/14 1618  GLUCAP 414* 155* 133* 207* 283*    Recent Results (from the past 240 hour(s))  URINE CULTURE     Status: None   Collection Time  03/16/14  3:28 AM      Result Value Ref Range Status   Specimen Description URINE, CLEAN CATCH   Final   Special Requests NONE   Final   Culture  Setup Time     Final   Value: 03/16/2014 10:30     Performed at [facility] Count     Final   Value: NO GROWTH     Performed at [facility]   Culture     Final   Value: NO GROWTH     Performed at [facility]   Report Status 03/17/2014 FINAL   Final     Studies: Dg Chest 2 View  03/15/2014   CLINICAL DATA:  Weakness  EXAM: CHEST  2 VIEW  COMPARISON:  February 21, 2014  FINDINGS: There is a 7 x 7 mm nodular opacity in the right upper lobe, not present previously. There is a nearby opacity measuring 1.3 x 1.0 cm more peripherally in the right upper lobe. No other similar-appearing structures are seen. There is trace interstitial  edema with cardiomegaly and pulmonary venous hypertension. There is no airspace consolidation. Patient is status post coronary artery bypass grafting. There is no appreciable adenopathy. No bone lesions.  IMPRESSION: 7 x 7 mm nodular opacity right upper lobe. There is also a 1.3 x 1.0 cm right upper lobe opacity. These findings were not seen on previous study. Advise correlation with noncontrast enhanced chest CT to further evaluate these apparent nodules.  There is evidence suggesting a degree of congestive heart failure. No airspace consolidation.   Electronically Signed   By: Lowella Grip M.D.   On: 03/15/2014 17:55   Ct Head Wo Contrast  03/15/2014   CLINICAL DATA:  slurred speech slurred speech  EXAM: CT HEAD WITHOUT CONTRAST  TECHNIQUE: Contiguous axial images were obtained from the base of the skull through the vertex without intravenous contrast.  COMPARISON:  CT HEAD W/O CM dated 01/16/2014  FINDINGS: No acute intracranial abnormality. Specifically, no hemorrhage, hydrocephalus, mass lesion, acute infarction, or significant intracranial injury. No acute calvarial abnormality. Age-appropriate global atrophy. Areas of low-attenuation are appreciated within the subcortical and periventricular white matter regions. Regions of chronic lacunar infarction left basal ganglia . An area of chronic PCA distribution infarction on the right. Basal ganglial calcifications are appreciated. The visualized paranasal sinuses and mastoid air cells are patent.  IMPRESSION: Involutional and chronic changes without acute abnormalities.   Electronically Signed   By: Margaree Mackintosh M.D.   On: 03/15/2014 17:56   Ct Chest Wo Contrast  03/15/2014   CLINICAL DATA:  Pulmonary nodules.  Hyperglycemia.  EXAM: CT CHEST WITHOUT CONTRAST  TECHNIQUE: Multidetector CT imaging of the chest was performed following the standard protocol without IV contrast.  COMPARISON:  DG CHEST 2 VIEW dated 03/15/2014  FINDINGS: Median sternotomy/  CABG. Cardiomegaly is present. Small right dependently layering and trace left pleural effusions are present. Ground-glass attenuation is present in the upper lobes along with interlobular septal thickening compatible with interstitial pulmonary edema.  The nodular densities on the prior chest radiograph represent peripheral pulmonary parenchymal areas of opacity extending to the pleural surface at the right apex in the right upper lobe. These may represent foci bronchopneumonia or alveolar edema. Underlying pulmonary nodule cannot be excluded.  Incidental imaging of the upper abdomen demonstrates a partially visualized rounded lesion in the interpolar left kidney measuring about 2 cm. Renal mass cannot be excluded. There are also left renal  collecting system calculi. A followup renal MRI should be considered for further assessment. Renal ultrasound could also be considered however MRI would be a better definitive test. Non-emergent MRI should be deferred until patient has been discharged for the acute illness, and can optimally cooperate with positioning and breath-holding instructions.  There is no axillary adenopathy. Aortic atherosclerosis. No mediastinal adenopathy.  IMPRESSION: 1. Constellation of findings compatible with mild CHF with small right-greater-than-left bilateral pleural effusions and cardiomegaly. 2. Nodular densities at the right apex on prior plain film may represent alveolar edema, foci of bronchopneumonia, pulmonary parenchymal scarring or pulmonary nodules. Radiographic followup is recommended to assess for clearing after the acute illness. 3. Partially visualized possible left renal mass and left renal calculi. See discussion above. MRI is preferred over renal ultrasound for definitive assessment. Non-emergent MRI should be deferred until patient has been discharged for the acute illness, and can optimally cooperate with positioning and breath-holding instructions.   Electronically Signed    By: Dereck Ligas M.D.   On: 03/15/2014 20:08   US Venous Img Lower Bilateral  03/15/2014   CLINICAL DATA:  Bilateral calf edema  EXAM: BILATERAL LOWER EXTREMITY VENOUS DOPPLER ULTRASOUND  TECHNIQUE: Gray-scale sonography with graded compression, as well as color Doppler and duplex ultrasound were performed to evaluate the lower extremity deep venous systems from the level of the common femoral vein and including the common femoral, femoral, profunda femoral, popliteal and calf veins including the posterior tibial, peroneal and gastrocnemius veins when visible. The superficial great saphenous vein was also interrogated. Spectral Doppler was utilized to evaluate flow at rest and with distal augmentation maneuvers in the common femoral, femoral and popliteal veins.  COMPARISON:  None.  FINDINGS: RIGHT LOWER EXTREMITY  Common Femoral Vein: No evidence of thrombus. Normal compressibility, respiratory phasicity and response to augmentation.  Saphenofemoral Junction: No evidence of thrombus. Normal compressibility and flow on color Doppler imaging.  Profunda Femoral Vein: No evidence of thrombus. Normal compressibility and flow on color Doppler imaging.  Femoral Vein: No evidence of thrombus. Normal compressibility, respiratory phasicity and response to augmentation.  Popliteal Vein: No evidence of thrombus. Normal compressibility, respiratory phasicity and response to augmentation.  Calf Veins: No evidence of thrombus. Normal compressibility and flow on color Doppler imaging.  Superficial Great Saphenous Vein: No evidence of thrombus. Normal compressibility and flow on color Doppler imaging.  Venous Reflux:  None.  Other Findings:  Edema is noted in the calf  LEFT LOWER EXTREMITY  Common Femoral Vein: No evidence of thrombus. Normal compressibility, respiratory phasicity and response to augmentation.  Saphenofemoral Junction: No evidence of thrombus. Normal compressibility and flow on color Doppler imaging.   Profunda Femoral Vein: No evidence of thrombus. Normal compressibility and flow on color Doppler imaging.  Femoral Vein: No evidence of thrombus. Normal compressibility, respiratory phasicity and response to augmentation.  Popliteal Vein: No evidence of thrombus. Normal compressibility, respiratory phasicity and response to augmentation.  Calf Veins: No evidence of thrombus. Normal compressibility and flow on color Doppler imaging.  Superficial Great Saphenous Vein: No evidence of thrombus. Normal compressibility and flow on color Doppler imaging.  Venous Reflux:  None.  Other Findings:  Edema is noted in the calf  IMPRESSION: No evidence of deep venous thrombosis.   Electronically Signed   By: Inez Catalina M.D.   On: 03/15/2014 18:09    Scheduled Meds: . aspirin EC  81 mg Oral Daily  . dipyridamole-aspirin  1 capsule Oral BID  . donepezil  10  mg Oral Daily  . ferrous gluconate  324 mg Oral BID WC  . folic acid  1 mg Oral Daily  . furosemide  40 mg Intravenous Daily  . heparin  5,000 Units Subcutaneous 3 times per day  . hydrALAZINE  75 mg Oral 3 times per day  . insulin aspart  0-9 Units Subcutaneous TID WC  . insulin detemir  6 Units Subcutaneous QHS  . isosorbide mononitrate  15 mg Oral Daily  . lactobacillus acidophilus & bulgar  1 tablet Oral TID WC  . levETIRAcetam  500 mg Oral BID  . [START ON 03/18/2014] levofloxacin (LEVAQUIN) IV  500 mg Intravenous Q48H  . metoprolol succinate  100 mg Oral Daily  . mupirocin ointment  1 application Topical BID  . pantoprazole  40 mg Oral Daily  . potassium chloride  10 mEq Oral Daily  . sodium bicarbonate  650 mg Oral TID  . sodium chloride  3 mL Intravenous Q12H  . torsemide  20 mg Oral Daily  . [START ON 03/21/2014] Vitamin D (Ergocalciferol)  50,000 Units Oral Q30 days   Continuous Infusions:   Principal Problem:   Hyperglycemia Active Problems:   Hypertension   Diabetes mellitus   Seizure disorder   ARF (acute renal failure)   Acute on  chronic renal failure   Bilateral lower extremity edema   Anemia   Systolic CHF, chronic last EF 40% 3/13   Chronic kidney disease (CKD), stage IV (severe)   CAP (community acquired pneumonia)   Hypoglycemia    Time spent: 25 minutes. Greater than 50% of this time was spent in direct contact with the patient coordinating care.    [facility] Pager [phone]  If 7PM-7AM, please contact night-coverage at [email or web address], password [facility] 03/17/2014, 4:34 PM  LOS: 2 days

## 2014-03-18 LAB — CBC
HEMATOCRIT: 26.6 % — AB (ref 36.0–46.0)
Hemoglobin: 8.4 g/dL — ABNORMAL LOW (ref 12.0–15.0)
MCH: 25.9 pg — ABNORMAL LOW (ref 26.0–34.0)
MCHC: 31.6 g/dL (ref 30.0–36.0)
MCV: 82.1 fL (ref 78.0–100.0)
PLATELETS: 257 10*3/uL (ref 150–400)
RBC: 3.24 MIL/uL — ABNORMAL LOW (ref 3.87–5.11)
RDW: 17.4 % — ABNORMAL HIGH (ref 11.5–15.5)
WBC: 3.4 10*3/uL — ABNORMAL LOW (ref 4.0–10.5)

## 2014-03-18 LAB — BASIC METABOLIC PANEL
BUN: 49 mg/dL — AB (ref 6–23)
CALCIUM: 8.8 mg/dL (ref 8.4–10.5)
CHLORIDE: 106 meq/L (ref 96–112)
CO2: 23 mEq/L (ref 19–32)
CREATININE: 2.8 mg/dL — AB (ref 0.50–1.10)
GFR calc Af Amer: 18 mL/min — ABNORMAL LOW (ref >90)
GFR calc non Af Amer: 16 mL/min — ABNORMAL LOW (ref >90)
Glucose, Bld: 256 mg/dL — ABNORMAL HIGH (ref 70–99)
Potassium: 4.1 mEq/L (ref 3.7–5.3)
Sodium: 140 mEq/L (ref 137–147)

## 2014-03-18 LAB — GLUCOSE, CAPILLARY
GLUCOSE-CAPILLARY: 231 mg/dL — AB (ref 70–99)
Glucose-Capillary: 214 mg/dL — ABNORMAL HIGH (ref 70–99)

## 2014-03-18 MED ORDER — INSULIN DETEMIR 100 UNIT/ML ~~LOC~~ SOLN: 8.0000 [IU] | Freq: Every day | SUBCUTANEOUS | Status: DC

## 2014-03-18 MED ORDER — LEVOFLOXACIN 750 MG PO TABS: 750.0000 mg | ORAL_TABLET | Freq: Every day | ORAL | Status: DC

## 2014-03-18 MED ORDER — INSULIN DETEMIR 100 UNIT/ML ~~LOC~~ SOLN
8.0000 [IU] | Freq: Every day | SUBCUTANEOUS | Status: DC
Start: 2014-03-18 — End: 2014-04-27

## 2014-03-18 NOTE — Progress Notes (Signed)
Patient states understanding of discharge instructions. Patient's daughter taught how to draw up the correct amount of insulin and how to given her mother injections and where to give injections.

## 2014-03-18 NOTE — Discharge Summary (Signed)
Physician Discharge Summary  [name] [id number] DOB: 1941-01-26 DOA: 03/15/2014  PCP: [name], MD  Admit date: 03/15/2014 Discharge date: 03/18/2014  Time spent: 45 minutes  Recommendations for Outpatient Follow-up:  -Will be discharged home today. -Advised to follow up with PCP in 2 weeks. -Would recommend a repeat CXR in 4-6 weeks to ensure complete resolution of PNA.   Discharge Diagnoses:  Principal Problem:   Hyperglycemia Active Problems:   Hypertension   Diabetes mellitus   Seizure disorder   ARF (acute renal failure)   Acute on chronic renal failure   Bilateral lower extremity edema   Anemia   Systolic CHF, chronic last EF 40% 3/13   Chronic kidney disease (CKD), stage IV (severe)   CAP (community acquired pneumonia)   Hypoglycemia   Discharge Condition: Stable and improved  Filed Weights   03/15/14 2038 03/17/14 0500 03/18/14 0601  Weight: 68.085 kg (150 lb 1.6 oz) 66.9 kg (147 lb 7.8 oz) 65.4 kg (144 lb 2.9 oz)    History of present illness:  73 year old female with history of systolic CHF with EF of 40 % on recent echo, history of CVA, seizure disorder, chronic kidney disease stage IV with baseline creatinine of 2.2 , diabetes mellitus on oral hypoglycemic, iron deficiency anemia , 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 Daughter reports that for past one week her blood was at home has been persistently above 300 to 350s. She was seen by her PCP 5 days back and her glipizide was switched to trajenda. However patient's fingersticks at home were consistently above 300 and was 460 today. She has also noticed patient to get up more frequently in the night to urinate. Patient denies increased thirst. She has been compliant with diet and her medications. Last A1c in the system it months back was 7.9. Patient denies any change in her weight or  appetite, tingling or numbness of her extremities. Denies any blurry vision. patient is never a poor historian.  Daughter reports that she has not noticed any increase in her leg swellings however patient did have some increase dyspnea on exertion and dry cough.  Patient denies headache, dizziness, fever, chills, nausea , vomiting, chest pain, palpitations, abdominal pain, bowel symptoms. Denies change in weight or appetite.  Course in the ED  Patient vitals were stable. Blood work showed anemia with hemoglobin at baseline. Chemistry showed worsening renal function with BUN of 56 and creatinine of 2.79. One set of troponin was negative. ProBNP was 37,000 which seems to be about her baseline. UA showed proteinuria and some bacteria.  Head CT was done as patient's reported patient to have some slurred speech to the ED physician and it was unremarkable for acute findings. Doppler ultrasound of the legs was done given increased swelling and was negative for DVT. Chest x-ray was done which showed nodular opacity of right upper lobe mid some degree of CHF. This was followed by CT of the chest without contrast which showed mild CHF with nodular opacity in the right apical lobe possible for alveolar edema versus bronchopneumonia versus scarring versus nodule and recommended followup CT scan. Also showed a possible left renal mass and recommended MRI as outpatient.  Patient given a dose of IV Lasix and IV units of aspart insulin and hospitalist called for admission to telemetry.   Hospital Course:   Hyperglycemia  -Better controlled.  -No  longer hypoglycemic.  -Will need long-acting insulin for the time being. This can be further addressed in the OP setting by her PCP.  Acute on Chronic Combined CHF  -Continue lasix PO. -No longer has signs of volume overload on exam (Is and Os have not been adequately documented). -No current oxygen requirements.  -ECHO 2/15: EF [id number], grade 1 diastolic dysfunction.  CAP   -Has an inflitrate on chest CT, but no clinical signs of PNA.  -Will transition to PO antibiotics for a total of 7 days.  -Has 5 days of levaquin remaining on DC.  Acute on CKD Stage IV  -Baseline cr around 2.2.  -Cr on DC is 2.8, slight increase related to diuresis.  Hypokalemia  -repleted.  -Mg ok at 1.9.  -Likely related to diuresis.  -Continue PO KCl at home.  Iron Deficiency Anemia  -Continue iron supplementation.   ?UTI  -Urine cx with no growth.   ??Left Renal Mass  -Will need a follow up MRI as an OP.  -This has been discussed with daughter [name] via telephone.  Seizure Disorder  -Continue Keppra. -No active seizures while in the hospital.   Procedures:  None   Consultations:  None  Discharge Instructions  Discharge Orders   Future Appointments Provider Department Dept Phone   05/11/2014 2:00 PM Arnoldo Lenis, MD [facility] [place] [phone]   Future Orders Complete By Expires   Diet - low sodium heart healthy  As directed    Discontinue IV  As directed    Increase activity slowly  As directed        Medication List         aspirin EC 81 MG tablet  Take 81 mg by mouth daily.     dipyridamole-aspirin 200-25 MG per 12 hr capsule  Commonly known as:  AGGRENOX  Take 1 capsule by mouth 2 (two) times daily.     donepezil 10 MG tablet  Commonly known as:  ARICEPT  Take 10 mg by mouth daily.     folic acid 1 MG tablet  Commonly known as:  FOLVITE  Take 1 mg by mouth daily.     furosemide 20 MG tablet  Commonly known as:  LASIX  Take 20 mg by mouth daily.     hydrALAZINE 25 MG tablet  Commonly known as:  APRESOLINE  Take 3 tablets (75 mg total) by mouth every 8 (eight) hours.     insulin detemir 100 UNIT/ML injection  Commonly known as:  LEVEMIR  Inject 0.08 mLs (8 Units total) into the skin at bedtime.     isosorbide mononitrate 15 mg Tb24 24 hr tablet  Commonly known as:  IMDUR  Take 0.5 tablets (15 mg total) by mouth  daily.     levETIRAcetam 100 MG/ML solution  Commonly known as:  KEPPRA  Take 5 mLs by mouth 2 (two) times daily.     levofloxacin 750 MG tablet  Commonly known as:  LEVAQUIN  Take 1 tablet (750 mg total) by mouth daily. For 5 days     metoprolol succinate 100 MG 24 hr tablet  Commonly known as:  TOPROL-XL  Take 1 tablet (100 mg total) by mouth daily. Take with or immediately following a meal.     mupirocin ointment 2 %  Commonly known as:  BACTROBAN  Apply 1 application topically 2 (two) times daily.     pantoprazole 40 MG tablet  Commonly known as:  PROTONIX  Take 1 tablet (40 mg  total) by mouth daily.     potassium chloride 10 MEQ CR capsule  Commonly known as:  MICRO-K  Take 10 mEq by mouth daily.     pravastatin 20 MG tablet  Commonly known as:  PRAVACHOL  Take 20 mg by mouth daily.     RESTORA PO  Take 1 capsule by mouth daily.     sodium bicarbonate 650 MG tablet  Take 650 mg by mouth 3 (three) times daily.     torsemide 20 MG tablet  Commonly known as:  DEMADEX  Take 1 tablet (20 mg total) by mouth daily.     TRADJENTA 5 MG Tabs tablet  Generic drug:  linagliptin  Take 5 mg by mouth daily.     Vitamin D (Ergocalciferol) 50000 UNITS Caps capsule  Commonly known as:  DRISDOL  Take 50,000 Units by mouth every 30 (thirty) days. Takes on Tuesday       No Known Allergies     Follow-up Information   Follow up with [facility], MD. Schedule an appointment as soon as possible for a visit in 2 weeks.   Specialty:  Family Medicine   Contact information:   [address] [place] [address] [phone]        The results of significant diagnostics from this hospitalization (including imaging, microbiology, ancillary and laboratory) are listed below for reference.    Significant Diagnostic Studies: Dg Chest 2 View  03/15/2014   CLINICAL DATA:  Weakness  EXAM: CHEST  2 VIEW  COMPARISON:  February 21, 2014  FINDINGS: There is a 7 x 7 mm nodular  opacity in the right upper lobe, not present previously. There is a nearby opacity measuring 1.3 x 1.0 cm more peripherally in the right upper lobe. No other similar-appearing structures are seen. There is trace interstitial edema with cardiomegaly and pulmonary venous hypertension. There is no airspace consolidation. Patient is status post coronary artery bypass grafting. There is no appreciable adenopathy. No bone lesions.  IMPRESSION: 7 x 7 mm nodular opacity right upper lobe. There is also a 1.3 x 1.0 cm right upper lobe opacity. These findings were not seen on previous study. Advise correlation with noncontrast enhanced chest CT to further evaluate these apparent nodules.  There is evidence suggesting a degree of congestive heart failure. No airspace consolidation.   Electronically Signed   By: Lowella Grip M.D.   On: 03/15/2014 17:55   Ct Head Wo Contrast  03/15/2014   CLINICAL DATA:  slurred speech slurred speech  EXAM: CT HEAD WITHOUT CONTRAST  TECHNIQUE: Contiguous axial images were obtained from the base of the skull through the vertex without intravenous contrast.  COMPARISON:  CT HEAD W/O CM dated 01/16/2014  FINDINGS: No acute intracranial abnormality. Specifically, no hemorrhage, hydrocephalus, mass lesion, acute infarction, or significant intracranial injury. No acute calvarial abnormality. Age-appropriate global atrophy. Areas of low-attenuation are appreciated within the subcortical and periventricular white matter regions. Regions of chronic lacunar infarction left basal ganglia . An area of chronic PCA distribution infarction on the right. Basal ganglial calcifications are appreciated. The visualized paranasal sinuses and mastoid air cells are patent.  IMPRESSION: Involutional and chronic changes without acute abnormalities.   Electronically Signed   By: Margaree Mackintosh M.D.   On: 03/15/2014 17:56   Ct Chest Wo Contrast  03/15/2014   CLINICAL DATA:  Pulmonary nodules.  Hyperglycemia.   EXAM: CT CHEST WITHOUT CONTRAST  TECHNIQUE: Multidetector CT imaging of the chest was performed  following the standard protocol without IV contrast.  COMPARISON:  DG CHEST 2 VIEW dated 03/15/2014  FINDINGS: Median sternotomy/ CABG. Cardiomegaly is present. Small right dependently layering and trace left pleural effusions are present. Ground-glass attenuation is present in the upper lobes along with interlobular septal thickening compatible with interstitial pulmonary edema.  The nodular densities on the prior chest radiograph represent peripheral pulmonary parenchymal areas of opacity extending to the pleural surface at the right apex in the right upper lobe. These may represent foci bronchopneumonia or alveolar edema. Underlying pulmonary nodule cannot be excluded.  Incidental imaging of the upper abdomen demonstrates a partially visualized rounded lesion in the interpolar left kidney measuring about 2 cm. Renal mass cannot be excluded. There are also left renal collecting system calculi. A followup renal MRI should be considered for further assessment. Renal ultrasound could also be considered however MRI would be a better definitive test. Non-emergent MRI should be deferred until patient has been discharged for the acute illness, and can optimally cooperate with positioning and breath-holding instructions.  There is no axillary adenopathy. Aortic atherosclerosis. No mediastinal adenopathy.  IMPRESSION: 1. Constellation of findings compatible with mild CHF with small right-greater-than-left bilateral pleural effusions and cardiomegaly. 2. Nodular densities at the right apex on prior plain film may represent alveolar edema, foci of bronchopneumonia, pulmonary parenchymal scarring or pulmonary nodules. Radiographic followup is recommended to assess for clearing after the acute illness. 3. Partially visualized possible left renal mass and left renal calculi. See discussion above. MRI is preferred over renal ultrasound  for definitive assessment. Non-emergent MRI should be deferred until patient has been discharged for the acute illness, and can optimally cooperate with positioning and breath-holding instructions.   Electronically Signed   By: Dereck Ligas M.D.   On: 03/15/2014 20:08   US Venous Img Lower Bilateral  03/15/2014   CLINICAL DATA:  Bilateral calf edema  EXAM: BILATERAL LOWER EXTREMITY VENOUS DOPPLER ULTRASOUND  TECHNIQUE: Gray-scale sonography with graded compression, as well as color Doppler and duplex ultrasound were performed to evaluate the lower extremity deep venous systems from the level of the common femoral vein and including the common femoral, femoral, profunda femoral, popliteal and calf veins including the posterior tibial, peroneal and gastrocnemius veins when visible. The superficial great saphenous vein was also interrogated. Spectral Doppler was utilized to evaluate flow at rest and with distal augmentation maneuvers in the common femoral, femoral and popliteal veins.  COMPARISON:  None.  FINDINGS: RIGHT LOWER EXTREMITY  Common Femoral Vein: No evidence of thrombus. Normal compressibility, respiratory phasicity and response to augmentation.  Saphenofemoral Junction: No evidence of thrombus. Normal compressibility and flow on color Doppler imaging.  Profunda Femoral Vein: No evidence of thrombus. Normal compressibility and flow on color Doppler imaging.  Femoral Vein: No evidence of thrombus. Normal compressibility, respiratory phasicity and response to augmentation.  Popliteal Vein: No evidence of thrombus. Normal compressibility, respiratory phasicity and response to augmentation.  Calf Veins: No evidence of thrombus. Normal compressibility and flow on color Doppler imaging.  Superficial Great Saphenous Vein: No evidence of thrombus. Normal compressibility and flow on color Doppler imaging.  Venous Reflux:  None.  Other Findings:  Edema is noted in the calf  LEFT LOWER EXTREMITY  Common Femoral  Vein: No evidence of thrombus. Normal compressibility, respiratory phasicity and response to augmentation.  Saphenofemoral Junction: No evidence of thrombus. Normal compressibility and flow on color Doppler imaging.  Profunda Femoral Vein: No evidence of thrombus. Normal compressibility and flow on color  Doppler imaging.  Femoral Vein: No evidence of thrombus. Normal compressibility, respiratory phasicity and response to augmentation.  Popliteal Vein: No evidence of thrombus. Normal compressibility, respiratory phasicity and response to augmentation.  Calf Veins: No evidence of thrombus. Normal compressibility and flow on color Doppler imaging.  Superficial Great Saphenous Vein: No evidence of thrombus. Normal compressibility and flow on color Doppler imaging.  Venous Reflux:  None.  Other Findings:  Edema is noted in the calf  IMPRESSION: No evidence of deep venous thrombosis.   Electronically Signed   By: Inez Catalina M.D.   On: 03/15/2014 18:09   US Venous Img Lower Unilateral Right  02/20/2014   CLINICAL DATA:  Right leg swelling  EXAM: Right LOWER EXTREMITY VENOUS DOPPLER ULTRASOUND  TECHNIQUE: Gray-scale sonography with graded compression, as well as color Doppler and duplex ultrasound, were performed to evaluate the deep venous system from the level of the common femoral vein through the popliteal and proximal calf veins. Spectral Doppler was utilized to evaluate flow at rest and with distal augmentation maneuvers.  COMPARISON:  None.  FINDINGS: Thrombus within deep veins:  None visualized.  Compressibility of deep veins:  Normal.  Duplex waveform respiratory phasicity:  Normal.  Duplex waveform response to augmentation:  Normal.  Venous reflux:  None visualized.  Other findings:  Diffuse calf edema is noted.  IMPRESSION: No evidence of deep venous thrombosis is noted.   Electronically Signed   By: Inez Catalina M.D.   On: 02/20/2014 12:01   Dg Chest Portable 1 View  02/21/2014   CLINICAL DATA:   Shortness of breath since this morning  EXAM: PORTABLE CHEST - 1 VIEW  COMPARISON:  DG CHEST 1V PORT dated 01/19/2014; DG CHEST 2 VIEW dated 07/22/2013; DG CHEST 1 VIEW dated 09/03/2012  FINDINGS: Stable mild to moderate cardiac enlargement in this patient who is status post CABG. There is vascular congestion. There is moderately severe interstitial prominence.  IMPRESSION: Cardiogenic pulmonary edema   Electronically Signed   By: Skipper Cliche M.D.   On: 02/21/2014 13:08    Microbiology: Recent Results (from the past 240 hour(s))  URINE CULTURE     Status: None   Collection Time    03/16/14  3:28 AM      Result Value Ref Range Status   Specimen Description URINE, CLEAN CATCH   Final   Special Requests NONE   Final   Culture  Setup Time     Final   Value: 03/16/2014 10:30     Performed at [facility]     Final   Value: NO GROWTH     Performed at [facility]   Culture     Final   Value: NO GROWTH     Performed at [facility]   Report Status 03/17/2014 FINAL   Final     Labs: Basic Metabolic Panel:  Recent Labs Lab 03/15/14 1645 03/16/14 0545 03/17/14 0515 03/18/14 0622  NA 139 145 143 140  K 3.7 3.0* 4.3 4.1  CL 105 109 110 106  CO2 22 24 21 23   GLUCOSE 463* 76 138* 256*  BUN 56* 53* 49* 49*  CREATININE 2.79* 2.45* 2.65* 2.80*  CALCIUM 8.3* 8.7 8.6 8.8  MG  --  1.9  --   --    Liver Function Tests:  Recent Labs Lab 03/15/14 1645  AST 24  ALT 19  ALKPHOS 67  BILITOT 0.2*  PROT 7.4  ALBUMIN 2.4*  No results found for this basename: LIPASE, AMYLASE,  in the last 168 hours No results found for this basename: AMMONIA,  in the last 168 hours CBC:  Recent Labs Lab 03/15/14 1645 03/16/14 0545 03/17/14 0515 03/18/14 0622  WBC 4.6 4.3 3.5* 3.4*  NEUTROABS 3.3  --   --   --   HGB 8.8* 8.7* 8.1* 8.4*  HCT 27.9* 27.6* 26.0* 26.6*  MCV 83.0 82.9 83.3 82.1  PLT 228 250 221 257   Cardiac Enzymes:  Recent Labs Lab  03/15/14 1645  TROPONINI <0.30   BNP: BNP (last 3 results)  Recent Labs  02/21/14 1230 02/22/14 0226 03/15/14 1645  PROBNP 35849.0* 38775.0* 37134.0*   CBG:  Recent Labs Lab 03/17/14 1104 03/17/14 1618 03/17/14 2103 03/18/14 0716 03/18/14 1130  GLUCAP 207* 283* 329* 214* 231*       Signed:  Erline Hau  [facility] Pager: [phone] 03/18/2014, 3:00 PM

## 2014-04-12 NOTE — Progress Notes (Signed)
Quick Note:  Patient has been in hospital twice since last OV for non-GI reasons. Colonoscopy has not been done due to this. We need to offer OV to consider colonoscopy. ______

## 2014-04-13 NOTE — Progress Notes (Signed)
Quick Note:  Called. Not set up. Mailed a letter for pt to call. ______

## 2014-04-24 NOTE — Progress Notes (Signed)
This encounter was created in error - please disregard.

## 2014-04-27 ENCOUNTER — Inpatient Hospital Stay (HOSPITAL_COMMUNITY)
Admission: EM | Admit: 2014-04-27 | Discharge: 2014-05-02 | DRG: 811 | Disposition: A | Discharge status: Home Health | Payer: Medicare Other | Attending: Internal Medicine | Admitting: Internal Medicine

## 2014-04-27 ENCOUNTER — Emergency Department (HOSPITAL_COMMUNITY): Payer: Medicare Other

## 2014-04-27 ENCOUNTER — Encounter (HOSPITAL_COMMUNITY): Payer: Self-pay | Admitting: Emergency Medicine

## 2014-04-27 DIAGNOSIS — F0390 Unspecified dementia without behavioral disturbance: Secondary | ICD-10-CM | POA: Diagnosis present

## 2014-04-27 DIAGNOSIS — Z833 Family history of diabetes mellitus: Secondary | ICD-10-CM

## 2014-04-27 DIAGNOSIS — E872 Acidosis, unspecified: Secondary | ICD-10-CM | POA: Diagnosis present

## 2014-04-27 DIAGNOSIS — Z8673 Personal history of transient ischemic attack (TIA), and cerebral infarction without residual deficits: Secondary | ICD-10-CM

## 2014-04-27 DIAGNOSIS — E119 Type 2 diabetes mellitus without complications: Secondary | ICD-10-CM

## 2014-04-27 DIAGNOSIS — E782 Mixed hyperlipidemia: Secondary | ICD-10-CM | POA: Diagnosis present

## 2014-04-27 DIAGNOSIS — I252 Old myocardial infarction: Secondary | ICD-10-CM

## 2014-04-27 DIAGNOSIS — Z87891 Personal history of nicotine dependence: Secondary | ICD-10-CM

## 2014-04-27 DIAGNOSIS — Z79899 Other long term (current) drug therapy: Secondary | ICD-10-CM

## 2014-04-27 DIAGNOSIS — D649 Anemia, unspecified: Secondary | ICD-10-CM

## 2014-04-27 DIAGNOSIS — N189 Chronic kidney disease, unspecified: Secondary | ICD-10-CM

## 2014-04-27 DIAGNOSIS — Z7982 Long term (current) use of aspirin: Secondary | ICD-10-CM

## 2014-04-27 DIAGNOSIS — R0989 Other specified symptoms and signs involving the circulatory and respiratory systems: Secondary | ICD-10-CM

## 2014-04-27 DIAGNOSIS — Z823 Family history of stroke: Secondary | ICD-10-CM

## 2014-04-27 DIAGNOSIS — D509 Iron deficiency anemia, unspecified: Secondary | ICD-10-CM | POA: Diagnosis present

## 2014-04-27 DIAGNOSIS — G40909 Epilepsy, unspecified, not intractable, without status epilepticus: Secondary | ICD-10-CM | POA: Diagnosis present

## 2014-04-27 DIAGNOSIS — I5043 Acute on chronic combined systolic (congestive) and diastolic (congestive) heart failure: Secondary | ICD-10-CM | POA: Diagnosis present

## 2014-04-27 DIAGNOSIS — I129 Hypertensive chronic kidney disease with stage 1 through stage 4 chronic kidney disease, or unspecified chronic kidney disease: Secondary | ICD-10-CM | POA: Diagnosis present

## 2014-04-27 DIAGNOSIS — I509 Heart failure, unspecified: Secondary | ICD-10-CM | POA: Diagnosis present

## 2014-04-27 DIAGNOSIS — N179 Acute kidney failure, unspecified: Secondary | ICD-10-CM | POA: Diagnosis present

## 2014-04-27 DIAGNOSIS — I2510 Atherosclerotic heart disease of native coronary artery without angina pectoris: Secondary | ICD-10-CM | POA: Diagnosis present

## 2014-04-27 DIAGNOSIS — E1129 Type 2 diabetes mellitus with other diabetic kidney complication: Secondary | ICD-10-CM | POA: Diagnosis present

## 2014-04-27 DIAGNOSIS — N039 Chronic nephritic syndrome with unspecified morphologic changes: Principal | ICD-10-CM

## 2014-04-27 DIAGNOSIS — I10 Essential (primary) hypertension: Secondary | ICD-10-CM | POA: Diagnosis present

## 2014-04-27 DIAGNOSIS — I2589 Other forms of chronic ischemic heart disease: Secondary | ICD-10-CM | POA: Diagnosis present

## 2014-04-27 DIAGNOSIS — Z951 Presence of aortocoronary bypass graft: Secondary | ICD-10-CM

## 2014-04-27 DIAGNOSIS — D631 Anemia in chronic kidney disease: Principal | ICD-10-CM | POA: Diagnosis present

## 2014-04-27 DIAGNOSIS — N184 Chronic kidney disease, stage 4 (severe): Secondary | ICD-10-CM | POA: Diagnosis present

## 2014-04-27 LAB — COMPREHENSIVE METABOLIC PANEL
ALT: 16 U/L (ref 0–35)
AST: 32 U/L (ref 0–37)
Albumin: 2.6 g/dL — ABNORMAL LOW (ref 3.5–5.2)
Alkaline Phosphatase: 79 U/L (ref 39–117)
BILIRUBIN TOTAL: 0.3 mg/dL (ref 0.3–1.2)
BUN: 80 mg/dL — ABNORMAL HIGH (ref 6–23)
CALCIUM: 8.7 mg/dL (ref 8.4–10.5)
CHLORIDE: 108 meq/L (ref 96–112)
CO2: 16 mEq/L — ABNORMAL LOW (ref 19–32)
Creatinine, Ser: 3.26 mg/dL — ABNORMAL HIGH (ref 0.50–1.10)
GFR calc Af Amer: 15 mL/min — ABNORMAL LOW (ref >90)
GFR calc non Af Amer: 13 mL/min — ABNORMAL LOW (ref >90)
Glucose, Bld: 211 mg/dL — ABNORMAL HIGH (ref 70–99)
Potassium: 4.2 mEq/L (ref 3.7–5.3)
Sodium: 142 mEq/L (ref 137–147)
Total Protein: 8.4 g/dL — ABNORMAL HIGH (ref 6.0–8.3)

## 2014-04-27 LAB — CBC WITH DIFFERENTIAL/PLATELET
BASOS PCT: 0 % (ref 0–1)
Basophils Absolute: 0 10*3/uL (ref 0.0–0.1)
Eosinophils Absolute: 0 10*3/uL (ref 0.0–0.7)
Eosinophils Relative: 0 % (ref 0–5)
HCT: 22.4 % — ABNORMAL LOW (ref 36.0–46.0)
HEMOGLOBIN: 6.8 g/dL — AB (ref 12.0–15.0)
LYMPHS PCT: 17 % (ref 12–46)
Lymphs Abs: 1.2 10*3/uL (ref 0.7–4.0)
MCH: 24.6 pg — ABNORMAL LOW (ref 26.0–34.0)
MCHC: 30.4 g/dL (ref 30.0–36.0)
MCV: 81.2 fL (ref 78.0–100.0)
Monocytes Absolute: 0.6 10*3/uL (ref 0.1–1.0)
Monocytes Relative: 8 % (ref 3–12)
NEUTROS PCT: 75 % (ref 43–77)
Neutro Abs: 5.5 10*3/uL (ref 1.7–7.7)
PLATELETS: 335 10*3/uL (ref 150–400)
RBC: 2.76 MIL/uL — AB (ref 3.87–5.11)
RDW: 17.8 % — ABNORMAL HIGH (ref 11.5–15.5)
WBC: 7.3 10*3/uL (ref 4.0–10.5)

## 2014-04-27 LAB — GLUCOSE, CAPILLARY: Glucose-Capillary: 187 mg/dL — ABNORMAL HIGH (ref 70–99)

## 2014-04-27 LAB — PREPARE RBC (CROSSMATCH)

## 2014-04-27 LAB — PRO B NATRIURETIC PEPTIDE: Pro B Natriuretic peptide (BNP): 51631 pg/mL — ABNORMAL HIGH (ref 0–125)

## 2014-04-27 LAB — TROPONIN I

## 2014-04-27 MED ORDER — VITAMIN D (ERGOCALCIFEROL) 1.25 MG (50000 UNIT) PO CAPS
50000.0000 [IU] | ORAL_CAPSULE | ORAL | Status: DC
  Administered 2014-05-02: 50000 [IU] via ORAL
  Filled 2014-04-27: qty 1

## 2014-04-27 MED ORDER — ONDANSETRON HCL 4 MG PO TABS: 4.0000 mg | ORAL_TABLET | Freq: Four times a day (QID) | ORAL | Status: DC | PRN

## 2014-04-27 MED ORDER — ALBUTEROL SULFATE (2.5 MG/3ML) 0.083% IN NEBU
2.5000 mg | INHALATION_SOLUTION | Freq: Once | RESPIRATORY_TRACT | Status: AC
  Administered 2014-04-27: 2.5 mg via RESPIRATORY_TRACT
  Filled 2014-04-27: qty 3

## 2014-04-27 MED ORDER — SODIUM BICARBONATE 650 MG PO TABS
650.0000 mg | ORAL_TABLET | Freq: Three times a day (TID) | ORAL | Status: DC
  Administered 2014-04-27 – 2014-05-02 (×15): 650 mg via ORAL
  Filled 2014-04-27 (×15): qty 1

## 2014-04-27 MED ORDER — ENOXAPARIN SODIUM 30 MG/0.3ML ~~LOC~~ SOLN: 30.0000 mg | SUBCUTANEOUS | Status: DC

## 2014-04-27 MED ORDER — LEVETIRACETAM 100 MG/ML PO SOLN
500.0000 mg | Freq: Two times a day (BID) | ORAL | Status: DC
  Administered 2014-04-27 – 2014-05-02 (×10): 500 mg via ORAL
  Filled 2014-04-27 (×12): qty 5

## 2014-04-27 MED ORDER — POTASSIUM CHLORIDE CRYS ER 20 MEQ PO TBCR
20.0000 meq | EXTENDED_RELEASE_TABLET | Freq: Every day | ORAL | Status: DC
  Administered 2014-04-28 – 2014-05-02 (×5): 20 meq via ORAL
  Filled 2014-04-27 (×5): qty 1

## 2014-04-27 MED ORDER — ASPIRIN-DIPYRIDAMOLE ER 25-200 MG PO CP12
ORAL_CAPSULE | ORAL | Status: AC
  Filled 2014-04-27: qty 1

## 2014-04-27 MED ORDER — METHYLPREDNISOLONE SODIUM SUCC 125 MG IJ SOLR
125.0000 mg | Freq: Once | INTRAMUSCULAR | Status: AC
  Administered 2014-04-27: 125 mg via INTRAVENOUS
  Filled 2014-04-27: qty 2

## 2014-04-27 MED ORDER — ASPIRIN EC 81 MG PO TBEC
81.0000 mg | DELAYED_RELEASE_TABLET | Freq: Every day | ORAL | Status: DC
  Administered 2014-04-28 – 2014-05-02 (×5): 81 mg via ORAL
  Filled 2014-04-27 (×5): qty 1

## 2014-04-27 MED ORDER — ASPIRIN-DIPYRIDAMOLE ER 25-200 MG PO CP12
1.0000 | ORAL_CAPSULE | Freq: Two times a day (BID) | ORAL | Status: DC
  Administered 2014-04-27 – 2014-05-02 (×10): 1 via ORAL
  Filled 2014-04-27 (×12): qty 1

## 2014-04-27 MED ORDER — FOLIC ACID 1 MG PO TABS
1.0000 mg | ORAL_TABLET | Freq: Every day | ORAL | Status: DC
  Administered 2014-04-28 – 2014-05-02 (×5): 1 mg via ORAL
  Filled 2014-04-27 (×5): qty 1

## 2014-04-27 MED ORDER — MUPIROCIN 2 % EX OINT
1.0000 "application " | TOPICAL_OINTMENT | Freq: Two times a day (BID) | CUTANEOUS | Status: DC
  Administered 2014-04-27 – 2014-05-02 (×10): 1 via TOPICAL
  Filled 2014-04-27 (×2): qty 22

## 2014-04-27 MED ORDER — HYDRALAZINE HCL 25 MG PO TABS
75.0000 mg | ORAL_TABLET | Freq: Three times a day (TID) | ORAL | Status: DC
  Administered 2014-04-27 – 2014-05-02 (×15): 75 mg via ORAL
  Filled 2014-04-27 (×15): qty 3

## 2014-04-27 MED ORDER — METOPROLOL SUCCINATE ER 50 MG PO TB24
100.0000 mg | ORAL_TABLET | Freq: Every day | ORAL | Status: DC
  Administered 2014-04-28 – 2014-05-02 (×5): 100 mg via ORAL
  Filled 2014-04-27 (×5): qty 2

## 2014-04-27 MED ORDER — DONEPEZIL HCL 5 MG PO TABS
10.0000 mg | ORAL_TABLET | Freq: Every day | ORAL | Status: DC
  Administered 2014-04-27 – 2014-05-02 (×6): 10 mg via ORAL
  Filled 2014-04-27 (×6): qty 2

## 2014-04-27 MED ORDER — ISOSORBIDE MONONITRATE ER 30 MG PO TB24
15.0000 mg | ORAL_TABLET | Freq: Every day | ORAL | Status: DC
  Administered 2014-04-28 – 2014-05-02 (×5): 15 mg via ORAL
  Filled 2014-04-27 (×5): qty 1

## 2014-04-27 MED ORDER — PANTOPRAZOLE SODIUM 40 MG PO TBEC
40.0000 mg | DELAYED_RELEASE_TABLET | Freq: Every day | ORAL | Status: DC
  Administered 2014-04-27 – 2014-05-02 (×6): 40 mg via ORAL
  Filled 2014-04-27 (×6): qty 1

## 2014-04-27 MED ORDER — FUROSEMIDE 10 MG/ML IJ SOLN
60.0000 mg | Freq: Two times a day (BID) | INTRAMUSCULAR | Status: DC
  Administered 2014-04-27 – 2014-04-28 (×2): 60 mg via INTRAVENOUS
  Filled 2014-04-27 (×2): qty 6

## 2014-04-27 MED ORDER — ENOXAPARIN SODIUM 30 MG/0.3ML ~~LOC~~ SOLN
30.0000 mg | SUBCUTANEOUS | Status: DC
  Administered 2014-04-27 – 2014-05-01 (×5): 30 mg via SUBCUTANEOUS
  Filled 2014-04-27 (×5): qty 0.3

## 2014-04-27 MED ORDER — INSULIN ASPART 100 UNIT/ML ~~LOC~~ SOLN: 0.0000 [IU] | Freq: Every day | SUBCUTANEOUS | Status: DC

## 2014-04-27 MED ORDER — INSULIN ASPART 100 UNIT/ML ~~LOC~~ SOLN
3.0000 [IU] | Freq: Three times a day (TID) | SUBCUTANEOUS | Status: DC
  Administered 2014-04-28 – 2014-05-02 (×15): 3 [IU] via SUBCUTANEOUS

## 2014-04-27 MED ORDER — IPRATROPIUM-ALBUTEROL 0.5-2.5 (3) MG/3ML IN SOLN
3.0000 mL | Freq: Once | RESPIRATORY_TRACT | Status: AC
  Administered 2014-04-27: 3 mL via RESPIRATORY_TRACT
  Filled 2014-04-27: qty 3

## 2014-04-27 MED ORDER — ONDANSETRON HCL 4 MG/2ML IJ SOLN: 4.0000 mg | Freq: Four times a day (QID) | INTRAMUSCULAR | Status: DC | PRN

## 2014-04-27 MED ORDER — INSULIN ASPART 100 UNIT/ML ~~LOC~~ SOLN
0.0000 [IU] | Freq: Three times a day (TID) | SUBCUTANEOUS | Status: DC
  Administered 2014-04-28: 3 [IU] via SUBCUTANEOUS
  Administered 2014-04-28: 2 [IU] via SUBCUTANEOUS
  Administered 2014-04-28: 3 [IU] via SUBCUTANEOUS
  Administered 2014-04-29 (×2): 1 [IU] via SUBCUTANEOUS
  Administered 2014-04-29 – 2014-04-30 (×2): 2 [IU] via SUBCUTANEOUS
  Administered 2014-04-30: 1 [IU] via SUBCUTANEOUS
  Administered 2014-04-30: 2 [IU] via SUBCUTANEOUS
  Administered 2014-05-01: 1 [IU] via SUBCUTANEOUS
  Administered 2014-05-01: 3 [IU] via SUBCUTANEOUS
  Administered 2014-05-02: 2 [IU] via SUBCUTANEOUS
  Administered 2014-05-02 (×2): 3 [IU] via SUBCUTANEOUS

## 2014-04-27 MED ORDER — BIOTENE DRY MOUTH MT LIQD
15.0000 mL | Freq: Two times a day (BID) | OROMUCOSAL | Status: DC
Start: 2014-04-27 — End: 2014-05-02
  Administered 2014-04-27 – 2014-05-02 (×10): 15 mL via OROMUCOSAL

## 2014-04-27 MED ORDER — FOLIC ACID 1 MG PO TABS: 1.0000 mg | ORAL_TABLET | Freq: Every day | ORAL | Status: DC

## 2014-04-27 MED ORDER — IPRATROPIUM-ALBUTEROL 0.5-2.5 (3) MG/3ML IN SOLN: 3.0000 mL | Freq: Once | RESPIRATORY_TRACT | Status: DC

## 2014-04-27 MED ORDER — POTASSIUM CHLORIDE CRYS ER 20 MEQ PO TBCR: 20.0000 meq | EXTENDED_RELEASE_TABLET | Freq: Every day | ORAL | Status: DC

## 2014-04-27 MED ORDER — SIMVASTATIN 10 MG PO TABS
10.0000 mg | ORAL_TABLET | Freq: Every day | ORAL | Status: DC
  Administered 2014-04-27 – 2014-05-02 (×6): 10 mg via ORAL
  Filled 2014-04-27 (×6): qty 1

## 2014-04-27 NOTE — H&P (Signed)
[facility] History and Physical  [name] CBU:[id number] DOB: 11-01-1941 DOA: 04/27/2014  Referring physician: ER. PCP: [name], MD   Chief Complaint: Dyspnea, symptomatic anemia.  HPI: [name] is a 73 y.o. female  This is a 73 year old moderately demented lady who has chronic kidney disease with a baseline creatinine of around 2.2, who sees nephrology, who presents with a choking episode at home approximately 3-4 hours ago followed by dyspnea. According to the daughter, who is at the bedside, she had a choking episode after having a drink of some sort. She denies any fever. The patient herself is unable to give me a clear history due to her dementia. The daughter describes leg swelling as well as dyspnea today. Evaluation in the emergency room showed worsening anemia with a hemoglobin of 6.8. Previously her hemoglobin had been in the 8 range. She is now being admitted for further investigation and management.     Past Medical History  Diagnosis Date  . Type 2 diabetes mellitus   . Essential hypertension, benign   . History of stroke     Previously on Coumadin  . MI, old     Reported 47  . Seizure disorder   . Coronary atherosclerosis of native coronary artery     Multivessel status post CABG 2002  . History of GI bleed     Erosive gastritis and duodenitis by EGD 2002  . Mixed hyperlipidemia   . Ischemic cardiomyopathy     LVEF 40-45% March 2013  . Dementia   . CKD (chronic kidney disease) stage 4, GFR 15-29 ml/min   . Stroke   . Seizures    Past Surgical History  Procedure Laterality Date  . Coronary artery bypass graft  July 2002    LIMA to LAD, SVG to OM1, SVG to OM 2, SVG to RCA  . Tee without cardioversion  01/28/2012    Procedure: TRANSESOPHAGEAL ECHOCARDIOGRAM (TEE);  Surgeon: Lelon Perla, MD;  Location: [facility] ENDOSCOPY;  Service: Cardiovascular;  Laterality: [name];  . Colonoscopy  2002  . Esophagogastroduodenoscopy  2002  .  Esophagogastroduodenoscopy [name] 01/18/2014    EHO:ZYYQM hiatal hernia. Abnormal gastric and duodenal bulbar mucosa of uncertain significance - status post gastric bx (chronic gastritis/H.pylori +   Social History:  reports that she has quit smoking. Her smoking use included Cigarettes. She has a 50 pack-year smoking history. She does not have any smokeless tobacco history on file. She reports that she does not drink alcohol or use illicit drugs.  No Known Allergies  Family History  Problem Relation Age of Onset  . Stroke Father   . Diabetes type II Mother   . Colon cancer Neg Hx      Prior to Admission medications   Medication Sig Start Date End Date Taking? Authorizing Provider  dipyridamole-aspirin (AGGRENOX) 200-25 MG per 12 hr capsule Take 1 capsule by mouth 2 (two) times daily.   Yes Historical Provider, MD  donepezil (ARICEPT) 10 MG tablet Take 10 mg by mouth at bedtime.  01/30/14  Yes Historical Provider, MD  hydrALAZINE (APRESOLINE) 25 MG tablet Take 75 mg by mouth every 8 (eight) hours.   Yes Historical Provider, MD  levETIRAcetam (KEPPRA) 100 MG/ML solution Take 5 mLs by mouth 2 (two) times daily. 01/13/14  Yes Historical Provider, MD  pravastatin (PRAVACHOL) 20 MG tablet Take 20 mg by mouth at bedtime.    Yes Historical Provider, MD  sodium bicarbonate 650 MG tablet Take 650 mg by mouth  3 (three) times daily.   Yes Historical Provider, MD  aspirin EC 81 MG tablet Take 81 mg by mouth daily.    Historical Provider, MD  folic acid (FOLVITE) 1 MG tablet Take 1 mg by mouth daily.    Historical Provider, MD  linagliptin (TRADJENTA) 5 MG TABS tablet Take 5 mg by mouth daily.    Historical Provider, MD  mupirocin ointment (BACTROBAN) 2 % Apply 1 application topically 2 (two) times daily. 03/13/14   Historical Provider, MD  potassium chloride (MICRO-K) 10 MEQ CR capsule Take 10 mEq by mouth daily.    Historical Provider, MD  Vitamin D, Ergocalciferol, (DRISDOL) 50000 UNITS CAPS Take 50,000  Units by mouth every 30 (thirty) days. Takes on Tuesday    Historical Provider, MD   Physical Exam: Filed Vitals:   04/27/14 1615  BP:   Pulse:   Temp:   Resp: 20    BP 157/78  Pulse 90  Temp(Src) 97.6 F (36.4 C) (Oral)  Resp 20  Ht 5\' 4"  (1.626 m)  Wt 69.854 kg (154 lb)  BMI 26.42 kg/m2  SpO2 100%  General:  Appears calm and comfortable. Does not appear to have increased work of breathing. Does not look pale. There is no peripheral or central cyanosis. Eyes: PERRL, normal lids, irises & conjunctiva ENT: grossly normal hearing, lips & tongue Neck: no LAD, masses or thyromegaly Cardiovascular: Heart sounds are present without any obvious murmurs. Jugular venous pressure does not appear to be elevated. Heart rate is irregular. There is peripheral pitting edema in her lower legs. Telemetry: Appears to be in sinus rhythm with multiple ectopics. Respiratory: CTA bilaterally, no w/r/r. Normal respiratory effort. Abdomen: soft, ntnd Skin: no rash or induration seen on limited exam Musculoskeletal: grossly normal tone BUE/BLE Psychiatric: Not examined. Neurologic: grossly non-focal.          Labs on Admission:  Basic Metabolic Panel:  Recent Labs Lab 04/27/14 1536  NA 142  K 4.2  CL 108  CO2 16*  GLUCOSE 211*  BUN 80*  CREATININE 3.26*  CALCIUM 8.7   Liver Function Tests:  Recent Labs Lab 04/27/14 1536  AST 32  ALT 16  ALKPHOS 79  BILITOT 0.3  PROT 8.4*  ALBUMIN 2.6*     CBC:  Recent Labs Lab 04/27/14 1536  WBC 7.3  NEUTROABS 5.5  HGB 6.8*  HCT 22.4*  MCV 81.2  PLT 335   Cardiac Enzymes:  Recent Labs Lab 04/27/14 1536  TROPONINI <0.30    BNP (last 3 results)  Recent Labs  02/22/14 0226 03/15/14 1645 04/27/14 1536  PROBNP 38775.0* 37134.0* 51631.0*      Radiological Exams on Admission: Dg Chest 2 View  04/27/2014   CLINICAL DATA:  CHOKING SHORTNESS OF BREATH  EXAM: CHEST  2 VIEW  COMPARISON:  Two-view chest 03/15/2014   FINDINGS: Cardiac silhouette is enlarged. Stable median sternotomy changes. Lungs clear. No acute osseous abnormalities. Mild degenerative changes within the shoulders.  IMPRESSION: No active cardiopulmonary disease.   Electronically Signed   By: Margaree Mackintosh M.D.   On: 04/27/2014 15:16    EKG: Independently reviewed. Sinus rhythm with what appears to be trigeminy. No acute ST-T wave elevation.  Assessment/Plan Active Problems:   Anemia   CHF, acute on chronic   Diabetes   Acute on chronic renal failure   Dementia   1. Symptomatic anemia, hemoglobin 6.8, normocytic. No evidence of acute GI bleed. Likely secondary to worsening renal failure. 2. Acute on chronic  renal failure. 3. Possible acute systolic congestive heart failure based on clinical exam. 4. Moderate dementia. 5. Diabetes mellitus. 6. Possible difficulties with swallowing with a history of choking on liquids.  Plan: 1. Admit to telemetry floor. 2. Give 2 units of blood. 3. Intravenous Lasix. 4. Nephrology consultation. 5. Control diabetes currently with sliding scale of insulin. 6. Swallowing evaluation.  Further recommendations will depend on patient's hospital progress.  Code Status: Full code. This was confirmed with the patient's daughter.  Family Communication: I discussed the plan with the daughter at the bedside.  Disposition Plan: Home when medically stable.   Time spent: 60 minutes.  [facility] Pager [phone].  **Disclaimer: This note may have been dictated with voice recognition software. Similar sounding words can inadvertently be transcribed and this note may contain transcription errors which may not have been corrected upon publication of note.**

## 2014-04-27 NOTE — ED Notes (Signed)
Pt was drinking water at home in presence of granddaughter when she began choking on the water. Granddaughter got concerned and called EMS. Pt presents via EMS with labored breathing, decreased O2 sats of 87-88% on room air, and wheezes and crackles audible at bedside.

## 2014-04-27 NOTE — ED Notes (Signed)
CRITICAL VALUE ALERT  Critical value received:  Hgb 6.8  Date of notification:  04/27/14  Time of notification:  1635  Critical value read back:yes  Nurse who received alert: Eilene Ghazi  MD notified (1st page):  Dr Dewayne Hatch  Time of first page: 1635  MD notified (2nd page):  Time of second page:  Responding MD:  Dr Dewayne Hatch  Time MD responded:  [phone]

## 2014-04-27 NOTE — ED Provider Notes (Signed)
CSN: [id number]     Arrival date & time 04/27/14  1450 History   First MD Initiated Contact with Patient 04/27/14 1506     Chief Complaint  Patient presents with  . Choking  . Shortness of Breath     (Consider location/radiation/quality/duration/timing/severity/associated sxs/prior Treatment) Patient is a 73 y.o. female presenting with shortness of breath. The history is provided by the patient (the pt and family states the pt choked on water and is sob).  Shortness of Breath Severity:  Moderate Onset quality:  Sudden Timing:  Constant Progression:  Unchanged Chronicity:  New Context: activity   Associated symptoms: wheezing   Associated symptoms: no abdominal pain, no chest pain, no cough, no headaches and no rash     Past Medical History  Diagnosis Date  . Type 2 diabetes mellitus   . Essential hypertension, benign   . History of stroke     Previously on Coumadin  . MI, old     Reported 45  . Seizure disorder   . Coronary atherosclerosis of native coronary artery     Multivessel status post CABG 2002  . History of GI bleed     Erosive gastritis and duodenitis by EGD 2002  . Mixed hyperlipidemia   . Ischemic cardiomyopathy     LVEF 40-45% March 2013  . Dementia   . CKD (chronic kidney disease) stage 4, GFR 15-29 ml/min   . Stroke   . Seizures    Past Surgical History  Procedure Laterality Date  . Coronary artery bypass graft  July 2002    LIMA to LAD, SVG to OM1, SVG to OM 2, SVG to RCA  . Tee without cardioversion  01/28/2012    Procedure: TRANSESOPHAGEAL ECHOCARDIOGRAM (TEE);  Surgeon: Lelon Perla, MD;  Location: [facility] ENDOSCOPY;  Service: Cardiovascular;  Laterality: [name];  . Colonoscopy  2002  . Esophagogastroduodenoscopy  2002  . Esophagogastroduodenoscopy [name] 01/18/2014    QMV:HQION hiatal hernia. Abnormal gastric and duodenal bulbar mucosa of uncertain significance - status post gastric bx (chronic gastritis/H.pylori +   Family History  Problem Relation  Age of Onset  . Stroke Father   . Diabetes type II Mother   . Colon cancer Neg Hx    History  Substance Use Topics  . Smoking status: Former Smoker -- 1.00 packs/day for 50 years    Types: Cigarettes  . Smokeless tobacco: Not on file  . Alcohol Use: No   OB History   Grav Para Term Preterm Abortions TAB SAB Ect Mult Living                 Review of Systems  Constitutional: Negative for appetite change and fatigue.  HENT: Negative for congestion, ear discharge and sinus pressure.   Eyes: Negative for discharge.  Respiratory: Positive for shortness of breath and wheezing. Negative for cough.   Cardiovascular: Negative for chest pain.  Gastrointestinal: Negative for abdominal pain and diarrhea.  Genitourinary: Negative for frequency and hematuria.  Musculoskeletal: Negative for back pain.  Skin: Negative for rash.  Neurological: Negative for seizures and headaches.  Psychiatric/Behavioral: Negative for hallucinations.      Allergies  Review of patient's allergies indicates no known allergies.  Home Medications   Prior to Admission medications   Medication Sig Start Date End Date Taking? Authorizing Provider  aspirin EC 81 MG tablet Take 81 mg by mouth daily.    Historical Provider, MD  dipyridamole-aspirin (AGGRENOX) 200-25 MG per 12 hr capsule Take 1 capsule by  mouth 2 (two) times daily.    Historical Provider, MD  donepezil (ARICEPT) 10 MG tablet Take 10 mg by mouth daily. 01/30/14   Historical Provider, MD  folic acid (FOLVITE) 1 MG tablet Take 1 mg by mouth daily.    Historical Provider, MD  furosemide (LASIX) 20 MG tablet Take 20 mg by mouth daily.    Historical Provider, MD  hydrALAZINE (APRESOLINE) 25 MG tablet Take 3 tablets (75 mg total) by mouth every 8 (eight) hours. 02/25/14   Samuella Cota, MD  insulin detemir (LEVEMIR) 100 UNIT/ML injection Inject 0.08 mLs (8 Units total) into the skin at bedtime. 03/18/14   Erline Hau, MD  isosorbide  mononitrate (IMDUR) 15 mg TB24 24 hr tablet Take 0.5 tablets (15 mg total) by mouth daily. 01/19/14   Kathie Dike, MD  levETIRAcetam (KEPPRA) 100 MG/ML solution Take 5 mLs by mouth 2 (two) times daily. 01/13/14   Historical Provider, MD  levofloxacin (LEVAQUIN) 750 MG tablet Take 1 tablet (750 mg total) by mouth daily. For 5 days 03/18/14   Erline Hau, MD  linagliptin (TRADJENTA) 5 MG TABS tablet Take 5 mg by mouth daily.    Historical Provider, MD  metoprolol succinate (TOPROL-XL) 100 MG 24 hr tablet Take 1 tablet (100 mg total) by mouth daily. Take with or immediately following a meal. 02/25/14   Samuella Cota, MD  mupirocin ointment (BACTROBAN) 2 % Apply 1 application topically 2 (two) times daily. 03/13/14   Historical Provider, MD  pantoprazole (PROTONIX) 40 MG tablet Take 1 tablet (40 mg total) by mouth daily. 01/19/14   Kathie Dike, MD  potassium chloride (MICRO-K) 10 MEQ CR capsule Take 10 mEq by mouth daily.    Historical Provider, MD  pravastatin (PRAVACHOL) 20 MG tablet Take 20 mg by mouth daily.    Historical Provider, MD  Probiotic Product (RESTORA PO) Take 1 capsule by mouth daily.    Historical Provider, MD  sodium bicarbonate 650 MG tablet Take 650 mg by mouth 3 (three) times daily.    Historical Provider, MD  torsemide (DEMADEX) 20 MG tablet Take 1 tablet (20 mg total) by mouth daily. 03/01/14   Arnoldo Lenis, MD  Vitamin D, Ergocalciferol, (DRISDOL) 50000 UNITS CAPS Take 50,000 Units by mouth every 30 (thirty) days. Takes on Tuesday    Historical Provider, MD   BP 157/78  Pulse 90  Temp(Src) 97.6 F (36.4 C) (Oral)  Resp 20  Ht 5\' 4"  (1.626 m)  Wt 154 lb (69.854 kg)  BMI 26.42 kg/m2  SpO2 100% Physical Exam  Constitutional: She is oriented to person, place, and time. She appears well-developed.  HENT:  Head: Normocephalic.  Eyes: Conjunctivae and EOM are normal. No scleral icterus.  Neck: Neck supple. No thyromegaly present.  Cardiovascular: Normal  rate and regular rhythm.  Exam reveals no gallop and no friction rub.   No murmur heard. Pulmonary/Chest: No stridor. She has wheezes. She has no rales. She exhibits no tenderness.  Abdominal: She exhibits no distension. There is no tenderness. There is no rebound.  Musculoskeletal: Normal range of motion. She exhibits no edema.  Lymphadenopathy:    She has no cervical adenopathy.  Neurological: She is oriented to person, place, and time. She exhibits normal muscle tone. Coordination normal.  Skin: No rash noted. No erythema.  Psychiatric: She has a normal mood and affect. Her behavior is normal.    ED Course  Procedures (including critical care time) Labs Review Labs  Reviewed  CBC WITH DIFFERENTIAL - Abnormal; Notable for the following:    RBC 2.76 (*)    Hemoglobin 6.8 (*)    HCT 22.4 (*)    MCH 24.6 (*)    RDW 17.8 (*)    All other components within normal limits  COMPREHENSIVE METABOLIC PANEL - Abnormal; Notable for the following:    CO2 16 (*)    Glucose, Bld 211 (*)    BUN 80 (*)    Creatinine, Ser 3.26 (*)    Total Protein 8.4 (*)    Albumin 2.6 (*)    GFR calc non Af Amer 13 (*)    GFR calc Af Amer 15 (*)    All other components within normal limits  PRO B NATRIURETIC PEPTIDE - Abnormal; Notable for the following:    Pro B Natriuretic peptide (BNP) 51631.0 (*)    All other components within normal limits  TROPONIN I    Imaging Review Dg Chest 2 View  04/27/2014   CLINICAL DATA:  CHOKING SHORTNESS OF BREATH  EXAM: CHEST  2 VIEW  COMPARISON:  Two-view chest 03/15/2014  FINDINGS: Cardiac silhouette is enlarged. Stable median sternotomy changes. Lungs clear. No acute osseous abnormalities. Mild degenerative changes within the shoulders.  IMPRESSION: No active cardiopulmonary disease.   Electronically Signed   By: Margaree Mackintosh M.D.   On: 04/27/2014 15:16     EKG Interpretation None      MDM   Final diagnoses:  Anemia    Admit for anemia and  dyspnea    Maudry Diego, MD 04/27/14 1722

## 2014-04-27 NOTE — Progress Notes (Signed)
Pt transferred to floor from ED. Pt is in no acute distress, VS are stable. Will continue to monitor.

## 2014-04-28 DIAGNOSIS — G40909 Epilepsy, unspecified, not intractable, without status epilepticus: Secondary | ICD-10-CM

## 2014-04-28 DIAGNOSIS — R0989 Other specified symptoms and signs involving the circulatory and respiratory systems: Secondary | ICD-10-CM | POA: Diagnosis present

## 2014-04-28 DIAGNOSIS — I5043 Acute on chronic combined systolic (congestive) and diastolic (congestive) heart failure: Secondary | ICD-10-CM

## 2014-04-28 LAB — CBC
HCT: 28.1 % — ABNORMAL LOW (ref 36.0–46.0)
Hemoglobin: 8.9 g/dL — ABNORMAL LOW (ref 12.0–15.0)
MCH: 25.5 pg — ABNORMAL LOW (ref 26.0–34.0)
MCHC: 31.7 g/dL (ref 30.0–36.0)
MCV: 80.5 fL (ref 78.0–100.0)
PLATELETS: 281 10*3/uL (ref 150–400)
RBC: 3.49 MIL/uL — AB (ref 3.87–5.11)
RDW: 16.9 % — ABNORMAL HIGH (ref 11.5–15.5)
WBC: 11.5 10*3/uL — AB (ref 4.0–10.5)

## 2014-04-28 LAB — GLUCOSE, CAPILLARY
Glucose-Capillary: 227 mg/dL — ABNORMAL HIGH (ref 70–99)
Glucose-Capillary: 209 mg/dL — ABNORMAL HIGH (ref 70–99)
Glucose-Capillary: 183 mg/dL — ABNORMAL HIGH (ref 70–99)
Glucose-Capillary: 122 mg/dL — ABNORMAL HIGH (ref 70–99)

## 2014-04-28 LAB — COMPREHENSIVE METABOLIC PANEL
ALBUMIN: 2.6 g/dL — AB (ref 3.5–5.2)
ALT: 12 U/L (ref 0–35)
AST: 24 U/L (ref 0–37)
Alkaline Phosphatase: 69 U/L (ref 39–117)
BILIRUBIN TOTAL: 0.7 mg/dL (ref 0.3–1.2)
BUN: 76 mg/dL — AB (ref 6–23)
CO2: 14 mEq/L — ABNORMAL LOW (ref 19–32)
CREATININE: 3 mg/dL — AB (ref 0.50–1.10)
Calcium: 8.8 mg/dL (ref 8.4–10.5)
Chloride: 105 mEq/L (ref 96–112)
GFR calc Af Amer: 17 mL/min — ABNORMAL LOW (ref >90)
GFR calc non Af Amer: 14 mL/min — ABNORMAL LOW (ref >90)
Glucose, Bld: 257 mg/dL — ABNORMAL HIGH (ref 70–99)
Potassium: 4.5 mEq/L (ref 3.7–5.3)
Sodium: 140 mEq/L (ref 137–147)
Total Protein: 8.2 g/dL (ref 6.0–8.3)

## 2014-04-28 MED ORDER — FUROSEMIDE 10 MG/ML IJ SOLN
100.0000 mg | Freq: Two times a day (BID) | INTRAVENOUS | Status: DC
  Administered 2014-04-28 – 2014-04-29 (×2): 100 mg via INTRAVENOUS
  Filled 2014-04-28 (×6): qty 10

## 2014-04-28 MED ORDER — INSULIN DETEMIR 100 UNIT/ML ~~LOC~~ SOLN
5.0000 [IU] | Freq: Every day | SUBCUTANEOUS | Status: DC
  Administered 2014-04-28 – 2014-05-02 (×5): 5 [IU] via SUBCUTANEOUS
  Filled 2014-04-28 (×6): qty 0.05

## 2014-04-28 NOTE — Progress Notes (Signed)
Physical Therapy Treatment Patient Details Name: [name] MRN: [id number] DOB: 1941/07/02 Today's Date: 04/28/2014    History of Present Illness This is a 73 year old moderately demented lady who has chronic kidney disease with a baseline creatinine of around 2.2, who sees nephrology, who presents with a choking episode at home approximately 3-4 hours ago followed by dyspnea. According to the daughter, who is at the bedside, she had a choking episode after having a drink of some sort. She denies any fever. The patient herself is unable to give me a clear history due to her dementia. The daughter describes leg swelling as well as dyspnea today. Evaluation in the emergency room showed worsening anemia with a hemoglobin of 6.8. Previously her hemoglobin had been in the 8 range. She is now being admitted for further investigation and management.    PT Comments    Assessed stair climbing -- patient able to ascend/descend stairs with step to gait pattern, use of (B) handrails, and min guard.  Patient able to ascend/descend 3 stairs x3 trials, with standing rest break between trials.  Noted patient had difficulty clearing step to ascend trailing foot during 2 steps.  Seated exercises completed at EOB with (B) UE support after stair climbing assessed.  Patient had difficulty with marching on the (L) LE requiring AAROM to clear foot.   Follow Up Recommendations  Home health PT           Precautions / Restrictions Precautions Precautions: Fall Restrictions Weight Bearing Restrictions: No    Mobility    Ambulation/Gait Ambulation/Gait assistance: Min guard Ambulation Distance (Feet): 5 Feet Assistive device: Rolling walker (2 wheeled) Gait Pattern/deviations: Decreased step length - right;Decreased step length - left         Stairs Stairs: Yes Stairs assistance: Min guard Stair Management: Two rails;Step to pattern Number of Stairs: 3 (For 3 trials)             Exercises  General Exercises - Lower Extremity Long Arc Quad: AROM;Both;10 reps;Seated;Other (comment) (at EOB with (B) UE support) Hip Flexion/Marching: AROM;AAROM;Both;10 reps;Seated (at EOB with (B) UE support)        Pertinent Vitals/Pain No complaints of pain reported prior to or during treatment.            PT Goals (current goals can now be found in the care plan section) Progress towards PT goals: Progressing toward goals    Frequency  Min 3X/week    PT Plan Current plan remains appropriate       End of Session Equipment Utilized During Treatment: Gait belt;Oxygen Activity Tolerance: Patient tolerated treatment well Patient left: in bed;with call bell/phone within reach;with bed alarm set     Time: 7322-0254 PT Time Calculation (min): 15 min  Charges:  $Therapeutic Activity: 8-22 mins                     [name] 04/28/2014, 4:20 PM

## 2014-04-28 NOTE — Plan of Care (Signed)
Problem: Phase I Progression Outcomes Goal: Voiding-avoid urinary catheter unless indicated Outcome: Not Applicable Date Met:  04/28/14 Pt has foley at this time d/t lasix     

## 2014-04-28 NOTE — Evaluation (Signed)
Clinical/Bedside Swallow Evaluation Patient Details  Name: JOETTE SCHMOKER MRN: [id number] Date of Birth: March 04, 1941  Today's Date: 04/28/2014 Time: 1545-1600 SLP Time Calculation (min): 15 min  Past Medical History:  Past Medical History  Diagnosis Date  . Type 2 diabetes mellitus   . Essential hypertension, benign   . History of stroke     Previously on Coumadin  . MI, old     Reported 15  . Seizure disorder   . Coronary atherosclerosis of native coronary artery     Multivessel status post CABG 2002  . History of GI bleed     Erosive gastritis and duodenitis by EGD 2002  . Mixed hyperlipidemia   . Ischemic cardiomyopathy     LVEF 40-45% March 2013  . Dementia   . CKD (chronic kidney disease) stage 4, GFR 15-29 ml/min   . Stroke   . Seizures    Past Surgical History:  Past Surgical History  Procedure Laterality Date  . Coronary artery bypass graft  July 2002    LIMA to LAD, SVG to OM1, SVG to OM 2, SVG to RCA  . Tee without cardioversion  01/28/2012    Procedure: TRANSESOPHAGEAL ECHOCARDIOGRAM (TEE);  Surgeon: Lelon Perla, MD;  Location: [facility] ENDOSCOPY;  Service: Cardiovascular;  Laterality: [name];  . Colonoscopy  2002  . Esophagogastroduodenoscopy  2002  . Esophagogastroduodenoscopy [name] 01/18/2014    WUJ:WJXBJ hiatal hernia. Abnormal gastric and duodenal bulbar mucosa of uncertain significance - status post gastric bx (chronic gastritis/H.pylori +   HPI:  This is a 73 year old moderately demented lady who has chronic kidney disease with a baseline creatinine of around 2.2, who sees nephrology, who presents with a choking episode at home approximately 3-4 hours ago followed by dyspnea.   BSE ordered due to current episode and PMH significant for dysphagia.   Assessment / Plan / Recommendation Clinical Impression  BSE completed.   No outward clinical s/s of aspiration noted throughout evaluation.  Min oral residue s/p swallow of solids but cleared with cued second swallow  and sips.  Slight WOB noted s/p PO trials.   Recommend to continue regular consistency and thin liquids with full supervision with all meals to assist and cue patient to utilize swallow strategies secondary to noted cognitive deficits.   ST to follow briefly in acute care setting for diet tolerance to ensure safety.  Recommend f/u with [facility] for diet consistency management and provide education to caregivers on swallow strategies and aspiration precautions due to recent choking episode.      Aspiration Risk  Moderate    Diet Recommendation Regular;Thin liquid   Liquid Administration via: Cup;Straw Medication Administration: Crushed with puree Supervision: Staff to assist with self feeding;Full supervision/cueing for compensatory strategies Compensations: Slow rate;Small sips/bites;Check for pocketing;Follow solids with liquid Postural Changes and/or Swallow Maneuvers: Seated upright 90 degrees;Upright 30-60 min after meal    Other  Recommendations Oral Care Recommendations: Oral care Q4 per protocol   Follow Up Recommendations  Home health SLP    Frequency and Duration min 1 x/week  1 week       SLP Swallow Goals Please refer to Care Plans for listed goals.    Swallow Study Prior Functional Status   Lives at home with family members    General Date of Onset: 04/27/14 HPI: This is a 73 year old moderately demented lady who has chronic kidney disease with a baseline creatinine of around 2.2, who sees nephrology, who presents with a choking episode at  home approximately 3-4 hours ago followed by dyspnea. According to the daughter, who is at the bedside, she had a choking episode after having a drink of some sort. She denies any fever. The patient herself is unable to give me a clear history due to her dementia. The daughter describes leg swelling as well as dyspnea today. Evaluation in the emergency room showed worsening anemia with a hemoglobin of 6.8. Previously her hemoglobin  had been in the 8 range. She is now being admitted for further investigation and management. Type of Study: Bedside swallow evaluation Previous Swallow Assessment: BSE 01/27/12 dysphagia 3/nectar, MBS 01/2012 dysphagia 3/thin, BSE 03/16/14 regular/thin liquids  Diet Prior to this Study: Regular;Thin liquids Temperature Spikes Noted: No Respiratory Status: Nasal cannula History of Recent Intubation: No Behavior/Cognition: Alert;Cooperative;Pleasant mood;Confused;Decreased sustained attention;Hard of hearing;Distractible;Requires cueing Oral Cavity - Dentition: Adequate natural dentition Self-Feeding Abilities: Able to feed self;Needs assist;Needs set up Patient Positioning: Upright in bed Baseline Vocal Quality: Breathy;Hoarse;Low vocal intensity Volitional Cough: Cognitively unable to elicit Volitional Swallow: Unable to elicit    Oral/Motor/Sensory Function Overall Oral Motor/Sensory Function: Impaired at baseline   Ice Chips Ice chips: Not tested   Thin Liquid Thin Liquid: Within functional limits Presentation: Cup;Straw    Nectar Thick Nectar Thick Liquid: Not tested   Honey Thick Honey Thick Liquid: Not tested   Puree Puree: Within functional limits   Solid   GO    Solid: Impaired Oral Phase Impairments: Poor awareness of bolus Oral Phase Functional Implications: Oral residue      Sharman Crate [place], [place] Chryl Heck Lilya Smitherman 04/28/2014,5:53 PM

## 2014-04-28 NOTE — Evaluation (Signed)
Occupational Therapy Evaluation Patient Details Name: [name] MRN: [id number] DOB: 16-Aug-1941 Today's Date: 04/28/2014    History of Present Illness This is a 73 year old moderately demented lady who has chronic kidney disease with a baseline creatinine of around 2.2, who sees nephrology, who presents with a choking episode at home approximately 3-4 hours ago followed by dyspnea. According to the daughter, who is at the bedside, she had a choking episode after having a drink of some sort. She denies any fever. The patient herself is unable to give me a clear history due to her dementia. The daughter describes leg swelling as well as dyspnea today. Evaluation in the emergency room showed worsening anemia with a hemoglobin of 6.8. Previously her hemoglobin had been in the 8 range. She is now being admitted for further investigation and management.   Clinical Impression   Pt is presenting to acute OT with above situation.  Family was not present to confirm baseline functioning, but pt reports needing assist with 'everything' at home.  She states that her daughter is capable of assisting with all her needs. Pt presents at grossly setup/supervision level with BADLs.  Pt is near baseline, and does not need further OT services at this time.      Follow Up Recommendations  No OT follow up    Equipment Recommendations  Other (comment);3 in 1 bedside comode (pt reports having seat in shower - confirmation of this with family, or recommend shower seat for safety)    Recommendations for Other Services       Precautions / Restrictions Precautions Precautions: Fall Restrictions Weight Bearing Restrictions: No      Mobility Bed Mobility Overal bed mobility: Modified Independent                Transfers Overall transfer level: Needs assistance Equipment used: Rolling walker (2 wheeled) Transfers: Sit to/from Stand Sit to Stand: Min guard              Balance Overall balance  assessment: Needs assistance Sitting-balance support: No upper extremity supported;Feet supported Sitting balance-Leahy Scale: Good     Standing balance support: Single extremity supported Standing balance-Leahy Scale: Good                              ADL Overall ADL's : At baseline Eating/Feeding: Set up   Grooming: Set up;Wash/dry face;Wash/dry Teacher, music: Proofreader      Pertinent Vitals/Pain Pt denies pain     Hand Dominance Right   Extremity/Trunk Assessment Upper Extremity Assessment Upper Extremity Assessment:  (On RUE anterior shoulder, pt has mass that she reports 'has been there for awhile')           Communication Communication Communication: No difficulties   Cognition Arousal/Alertness: Awake/alert Behavior During Therapy: WFL for tasks assessed/performed Overall Cognitive Status: No family/caregiver present to determine baseline cognitive functioning                     General Comments       Exercises  Shoulder Instructions      Home Living Family/patient expects to be discharged to:: Private residence Living Arrangements: Children (Daughter) Available Help at Discharge: Family         Home Layout: Two level (pt reports liing on second level with 'a lot ' of steps)     Bathroom Shower/Tub: Walk-in Psychologist, prison and probation services: Standard     Home Equipment: Environmental consultant - 2 wheels;Shower seat          Prior Functioning/Environment Level of Independence: Needs assistance  Gait / Transfers Assistance Needed: Pt has been using RW ADL's / Homemaking Assistance Needed: Pt reports that her daughter assists with 'everything,' both BADLs and IADLs.        OT Diagnosis:     OT Problem List:     OT Treatment/Interventions:      OT Goals(Current goals can be found in the care plan section)  Acute Rehab OT Goals Patient Stated Goal: No OT goals needed OT Goal Formulation: With patient  OT Frequency:     Barriers to D/C:            Co-evaluation              End of Session Equipment Utilized During Treatment: Gait belt;Rolling walker  Activity Tolerance: Patient tolerated treatment well Patient left: in bed;with call bell/phone within reach;with bed alarm set   Time: 3570-1779 OT Time Calculation (min): 25 min Charges:  OT General Charges $OT Visit: 1 Procedure OT Evaluation $Initial OT Evaluation Tier I: 1 Procedure G-Codes:     Bea Graff, MS, OTR/L [phone]  04/28/2014, 9:18 AM

## 2014-04-28 NOTE — Progress Notes (Signed)
Utilization Review Complete  

## 2014-04-28 NOTE — Consult Note (Signed)
Reason for Consult: Worsening of renal failure Referring Physician: Dr. Tor Netters [name] is an 73 y.o. female.  HPI: She is a patient who has history of diabetes, hypertension, CVA and chronic renal failure stage IV presently was brought by family because patient had an episode of choking. Patient also her difficulty in breathing seems to be getting better. Presently patient denies any nausea but is still that she has episode of vomiting yesterday. Presently patient offers no complaints.  Past Medical History  Diagnosis Date  . Type 2 diabetes mellitus   . Essential hypertension, benign   . History of stroke     Previously on Coumadin  . MI, old     Reported 41  . Seizure disorder   . Coronary atherosclerosis of native coronary artery     Multivessel status post CABG 2002  . History of GI bleed     Erosive gastritis and duodenitis by EGD 2002  . Mixed hyperlipidemia   . Ischemic cardiomyopathy     LVEF 40-45% March 2013  . Dementia   . CKD (chronic kidney disease) stage 4, GFR 15-29 ml/min   . Stroke   . Seizures     Past Surgical History  Procedure Laterality Date  . Coronary artery bypass graft  July 2002    LIMA to LAD, SVG to OM1, SVG to OM 2, SVG to RCA  . Tee without cardioversion  01/28/2012    Procedure: TRANSESOPHAGEAL ECHOCARDIOGRAM (TEE);  Surgeon: Lelon Perla, MD;  Location: [facility] ENDOSCOPY;  Service: Cardiovascular;  Laterality: [name];  . Colonoscopy  2002  . Esophagogastroduodenoscopy  2002  . Esophagogastroduodenoscopy [name] 01/18/2014    PVX:YIAXK hiatal hernia. Abnormal gastric and duodenal bulbar mucosa of uncertain significance - status post gastric bx (chronic gastritis/H.pylori +    Family History  Problem Relation Age of Onset  . Stroke Father   . Diabetes type II Mother   . Colon cancer Neg Hx     Social History:  reports that she has quit smoking. Her smoking use included Cigarettes. She has a 50 pack-year smoking history. She does not have  any smokeless tobacco history on file. She reports that she does not drink alcohol or use illicit drugs.  Allergies: No Known Allergies  Medications: I have reviewed the patient's current medications.  Results for orders placed during the hospital encounter of 04/27/14 (from the past 48 hour(s))  CBC WITH DIFFERENTIAL     Status: Abnormal   Collection Time    04/27/14  3:36 PM      Result Value Ref Range   WBC 7.3  4.0 - 10.5 K/uL   RBC 2.76 (*) 3.87 - 5.11 MIL/uL   Hemoglobin 6.8 (*) 12.0 - 15.0 g/dL   Comment: CRITICAL RESULT CALLED TO, READ BACK BY AND VERIFIED WITH:     PRUITT,G. AT 1630 ON 04/27/2014 BY BAUGHAM,M.   HCT 22.4 (*) 36.0 - 46.0 %   Comment: RESULT REPEATED AND VERIFIED   MCV 81.2  78.0 - 100.0 fL   MCH 24.6 (*) 26.0 - 34.0 pg   MCHC 30.4  30.0 - 36.0 g/dL   RDW 17.8 (*) 11.5 - 15.5 %   Platelets 335  150 - 400 K/uL   Neutrophils Relative % 75  43 - 77 %   Lymphocytes Relative 17  12 - 46 %   Monocytes Relative 8  3 - 12 %   Eosinophils Relative 0  0 - 5 %   Basophils  Relative 0  0 - 1 %   Neutro Abs 5.5  1.7 - 7.7 K/uL   Lymphs Abs 1.2  0.7 - 4.0 K/uL   Monocytes Absolute 0.6  0.1 - 1.0 K/uL   Eosinophils Absolute 0.0  0.0 - 0.7 K/uL   Basophils Absolute 0.0  0.0 - 0.1 K/uL   RBC Morphology POLYCHROMASIA PRESENT     Comment: SCHISTOCYTES PRESENT (2-5/hpf)   WBC Morphology WHITE COUNT CONFIRMED ON SMEAR     Comment: ATYPICAL LYMPHOCYTES   Smear Review PLATELET COUNT CONFIRMED BY SMEAR     Comment: LARGE PLATELETS PRESENT     GIANT PLATELETS SEEN  COMPREHENSIVE METABOLIC PANEL     Status: Abnormal   Collection Time    04/27/14  3:36 PM      Result Value Ref Range   Sodium 142  137 - 147 mEq/L   Potassium 4.2  3.7 - 5.3 mEq/L   Chloride 108  96 - 112 mEq/L   CO2 16 (*) 19 - 32 mEq/L   Glucose, Bld 211 (*) 70 - 99 mg/dL   BUN 80 (*) 6 - 23 mg/dL   Creatinine, Ser 3.26 (*) 0.50 - 1.10 mg/dL   Calcium 8.7  8.4 - 10.5 mg/dL   Total Protein 8.4 (*) 6.0 -  8.3 g/dL   Albumin 2.6 (*) 3.5 - 5.2 g/dL   AST 32  0 - 37 U/L   ALT 16  0 - 35 U/L   Alkaline Phosphatase 79  39 - 117 U/L   Total Bilirubin 0.3  0.3 - 1.2 mg/dL   GFR calc non Af Amer 13 (*) >90 mL/min   GFR calc Af Amer 15 (*) >90 mL/min   Comment: (NOTE)     The eGFR has been calculated using the CKD EPI equation.     This calculation has not been validated in all clinical situations.     eGFR's persistently <90 mL/min signify possible Chronic Kidney     Disease.  PRO B NATRIURETIC PEPTIDE     Status: Abnormal   Collection Time    04/27/14  3:36 PM      Result Value Ref Range   Pro B Natriuretic peptide (BNP) 51631.0 (*) 0 - 125 pg/mL  TROPONIN I     Status: None   Collection Time    04/27/14  3:36 PM      Result Value Ref Range   Troponin I <0.30  <0.30 ng/mL   Comment:            Due to the release kinetics of cTnI,     a negative result within the first hours     of the onset of symptoms does not rule out     myocardial infarction with certainty.     If myocardial infarction is still suspected,     repeat the test at appropriate intervals.  PREPARE RBC (CROSSMATCH)     Status: None   Collection Time    04/27/14  6:00 PM      Result Value Ref Range   Order Confirmation ORDER PROCESSED BY BLOOD BANK    TYPE AND SCREEN     Status: None   Collection Time    04/27/14  7:25 PM      Result Value Ref Range   ABO/RH(D) A POS     Antibody Screen NEG     Sample Expiration 04/30/2014     Unit Number [id number]     Blood Component  Type RED CELLS,LR     Unit division 00     Status of Unit ISSUED     Transfusion Status OK TO TRANSFUSE     Crossmatch Result Compatible     Unit Number [id number]     Blood Component Type RED CELLS,LR     Unit division 00     Status of Unit ISSUED     Transfusion Status OK TO TRANSFUSE     Crossmatch Result Compatible    GLUCOSE, CAPILLARY     Status: Abnormal   Collection Time    04/27/14  8:46 PM      Result Value Ref Range    Glucose-Capillary 187 (*) 70 - 99 mg/dL  COMPREHENSIVE METABOLIC PANEL     Status: Abnormal   Collection Time    04/28/14  5:52 AM      Result Value Ref Range   Sodium 140  137 - 147 mEq/L   Potassium 4.5  3.7 - 5.3 mEq/L   Chloride 105  96 - 112 mEq/L   CO2 14 (*) 19 - 32 mEq/L   Glucose, Bld 257 (*) 70 - 99 mg/dL   BUN 76 (*) 6 - 23 mg/dL   Creatinine, Ser 3.00 (*) 0.50 - 1.10 mg/dL   Calcium 8.8  8.4 - 10.5 mg/dL   Total Protein 8.2  6.0 - 8.3 g/dL   Albumin 2.6 (*) 3.5 - 5.2 g/dL   AST 24  0 - 37 U/L   ALT 12  0 - 35 U/L   Alkaline Phosphatase 69  39 - 117 U/L   Total Bilirubin 0.7  0.3 - 1.2 mg/dL   GFR calc non Af Amer 14 (*) >90 mL/min   GFR calc Af Amer 17 (*) >90 mL/min   Comment: (NOTE)     The eGFR has been calculated using the CKD EPI equation.     This calculation has not been validated in all clinical situations.     eGFR's persistently <90 mL/min signify possible Chronic Kidney     Disease.  CBC     Status: Abnormal   Collection Time    04/28/14  5:52 AM      Result Value Ref Range   WBC 11.5 (*) 4.0 - 10.5 K/uL   RBC 3.49 (*) 3.87 - 5.11 MIL/uL   Hemoglobin 8.9 (*) 12.0 - 15.0 g/dL   Comment: DELTA CHECK NOTED   HCT 28.1 (*) 36.0 - 46.0 %   MCV 80.5  78.0 - 100.0 fL   MCH 25.5 (*) 26.0 - 34.0 pg   MCHC 31.7  30.0 - 36.0 g/dL   RDW 16.9 (*) 11.5 - 15.5 %   Platelets 281  150 - 400 K/uL    Dg Chest 2 View  04/27/2014   CLINICAL DATA:  CHOKING SHORTNESS OF BREATH  EXAM: CHEST  2 VIEW  COMPARISON:  Two-view chest 03/15/2014  FINDINGS: Cardiac silhouette is enlarged. Stable median sternotomy changes. Lungs clear. No acute osseous abnormalities. Mild degenerative changes within the shoulders.  IMPRESSION: No active cardiopulmonary disease.   Electronically Signed   By: Margaree Mackintosh M.D.   On: 04/27/2014 15:16    Review of Systems  Respiratory: Positive for shortness of breath.   Cardiovascular: Negative for orthopnea.  Gastrointestinal: Positive for  vomiting. Negative for nausea.       Patient was brought because of chocking episode  Neurological: Negative for weakness.   Blood pressure 176/73, pulse 79, temperature 98.1 F (36.7 C),  temperature source Oral, resp. rate 18, height 5' 4"  (1.626 m), weight 72.439 kg (159 lb 11.2 oz), SpO2 100.00%. Physical Exam  Constitutional: No distress.  Eyes: No scleral icterus.  Neck: JVD present.  Cardiovascular: Regular rhythm.   Respiratory: No respiratory distress. She has wheezes. She has rales.  GI: There is no tenderness. There is no rebound.  Musculoskeletal: She exhibits edema.    Assessment/Plan: Problem #1 renal failure chronic and stage IV. Patient has history of chronic renal failure this for the last 3 years. Her last creatinine on 02/22/2014 was 1.93 and had EGFR was27 cc per minute. She has ultrasound of the kidneys on 06/22/2013 showed right kidney to be 10.2 and also her left kidney 10.2. With bilateral increased echogenicity consistent with chronic renal failure. The present increasing BUN and creatinine could be secondary to natural progression of disease. The etiology was thought to be secondary to diabetes/hypertension/ischemic. Problem #2 anemia: Patient with  previous history of iron deficiency anemia. On February 2015 her iron saturation was 4% and ferritin of 28%. Patient had history of GI bleeding from possible erosive gastritis.Superimposed anemia of chronic disease is also a possiblety . Problem #3 history of ischemic cardiomyopathy. Patient with sinus fluid overload. She has leg edema and also elevated JVD. Problem #4 history of diabetes Problem #5 history of CVA Problem #6 coronary artery disease: Status post CABG presently patient denies any chest pain. Problem #7 history of hypertension: Her blood pressure is high Problem #8 history of dementia Problem #9 metabolic bone disease her calcium is range but phosphorus is not available. Problem #10 acid-base balance:  Patient was metabolic acidosis she is on sodium bicarbonate. Plan: We'll check her iron studies If iron studies showed iron deficiency anemia we'll give her IV iron We'll check her basic metabolic panel, phosphorus and CBC in the morning. We'll increase her Lasix to 100 mg IV twice a day. Start on Amelodopine 5 mg po once a day  Harriett Sine 04/28/2014, 7:56 AM

## 2014-04-28 NOTE — Progress Notes (Signed)
[facility] PROGRESS NOTE  [name] XTK:[id number] DOB: 1941-05-12 DOA: 04/27/2014 PCP: [name], MD  Assessment/Plan: #1 symptomatic anemia Likely secondary to chronic kidney disease. Patient with no overt GI bleed. Patient is status post 2 units of packed red blood cells with appropriate response with hemoglobin currently at 8.9. Anemia panel has been ordered by nephrology and is pending. Follow.  #2 probable acute on chronic systolic CHF Patient is noted to be volume overloaded on clinical exam with lower extremity edema, JVD urine, some crackles on lung exam. Patient with clinical improvement with diuresis. Diuretic dose has been increased per nephrology.  #3 acute on chronic kidney disease stage IV versus progressive chronic kidney disease Likely secondary to problem #2. Patient's chronic kidney disease secondary to diabetes, hypertension. Patient has been placed on IV diuretics and dose has been increased per nephrology. Monitor renal function. Nephrology following and appreciate input and recommendations.  #4 acidosis Secondary to problem #3. Continue bicarbonate tablets. If no improvement may need to increase dose.  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 Continue aspirin, hydralazine,imdur, Toprol-XL, Zocor.  #6 history of seizure disorder Stable. Continue Keppra.  #7 history of CVA Stable. Continue Aggrenox for secondary stroke prevention. Continue risk factor modification.  #8 choking Speech therapy evaluation pending.  #9 diabetes mellitus Will place on Levemir 5 units daily. Sliding scale insulin.  #10 moderate dementia Stable. Follow.  #11 prophylaxis Protonix for GI prophylaxis. Lovenox for DVT prophylaxis.  Code Status: Full Family Communication: Updated patient no family at bedside. Disposition Plan: Home when medically stable.   Consultants:  Nephrology: Dr. Hinda Lenis 04/28/2014  Procedures:  Chest x-ray 04/27/2014  2  units packed red blood cells 04/27/2014  Antibiotics:  None  HPI/Subjective: Patient denies any further choking episodes. Patient states shortness of breath has improved.  Objective: Filed Vitals:   04/28/14 0400  BP: 176/73  Pulse: 79  Temp: 98.1 F (36.7 C)  Resp: 18    Intake/Output Summary (Last 24 hours) at 04/28/14 1007 Last data filed at 04/28/14 0900  Gross per 24 hour  Intake    840 ml  Output   1300 ml  Net   -460 ml   Filed Weights   04/27/14 1455 04/27/14 1853  Weight: 69.854 kg (154 lb) 72.439 kg (159 lb 11.2 oz)    Exam:   General:  NAD  Cardiovascular: RRR. Positive JVD  Respiratory: Scattered crackles.  Abdomen: Soft, nontender, nondistended, positive bowel sounds.  Musculoskeletal:  2+ bilateral lower extremity edema.   Data Reviewed: Basic Metabolic Panel:  Recent Labs Lab 04/27/14 1536 04/28/14 0552  NA 142 140  K 4.2 4.5  CL 108 105  CO2 16* 14*  GLUCOSE 211* 257*  BUN 80* 76*  CREATININE 3.26* 3.00*  CALCIUM 8.7 8.8   Liver Function Tests:  Recent Labs Lab 04/27/14 1536 04/28/14 0552  AST 32 24  ALT 16 12  ALKPHOS 79 69  BILITOT 0.3 0.7  PROT 8.4* 8.2  ALBUMIN 2.6* 2.6*   No results found for this basename: LIPASE, AMYLASE,  in the last 168 hours No results found for this basename: AMMONIA,  in the last 168 hours CBC:  Recent Labs Lab 04/27/14 1536 04/28/14 0552  WBC 7.3 11.5*  NEUTROABS 5.5  --   HGB 6.8* 8.9*  HCT 22.4* 28.1*  MCV 81.2 80.5  PLT 335 281   Cardiac Enzymes:  Recent Labs Lab 04/27/14 1536  TROPONINI <0.30   BNP (last 3 results)  Recent Labs  02/22/14 0226 03/15/14 1645 04/27/14 1536  PROBNP 38775.0* 37134.0* 51631.0*   CBG:  Recent Labs Lab 04/27/14 2046 04/28/14 0808  GLUCAP 187* 227*    No results found for this or any previous visit (from the past 240 hour(s)).   Studies: Dg Chest 2 View  04/27/2014   CLINICAL DATA:  CHOKING SHORTNESS OF BREATH  EXAM: CHEST  2  VIEW  COMPARISON:  Two-view chest 03/15/2014  FINDINGS: Cardiac silhouette is enlarged. Stable median sternotomy changes. Lungs clear. No acute osseous abnormalities. Mild degenerative changes within the shoulders.  IMPRESSION: No active cardiopulmonary disease.   Electronically Signed   By: Margaree Mackintosh M.D.   On: 04/27/2014 15:16    Scheduled Meds: . antiseptic oral rinse  15 mL Mouth Rinse BID  . aspirin EC  81 mg Oral Daily  . dipyridamole-aspirin  1 capsule Oral BID  . donepezil  10 mg Oral Daily  . enoxaparin (LOVENOX) injection  30 mg Subcutaneous Q24H  . folic acid  1 mg Oral Daily  . furosemide  100 mg Intravenous Q12H  . hydrALAZINE  75 mg Oral 3 times per day  . insulin aspart  0-5 Units Subcutaneous QHS  . insulin aspart  0-9 Units Subcutaneous TID WC  . insulin aspart  3 Units Subcutaneous TID WC  . insulin detemir  5 Units Subcutaneous Daily  . isosorbide mononitrate  15 mg Oral Daily  . levETIRAcetam  500 mg Oral BID  . metoprolol succinate  100 mg Oral Daily  . mupirocin ointment  1 application Topical BID  . pantoprazole  40 mg Oral Daily  . potassium chloride  20 mEq Oral Daily  . simvastatin  10 mg Oral q1800  . sodium bicarbonate  650 mg Oral TID  . [START ON 05/02/2014] Vitamin D (Ergocalciferol)  50,000 Units Oral Q30 days   Continuous Infusions:   Principal Problem:   Anemia Active Problems:   Acute on chronic renal failure   Acute on chronic combined systolic and diastolic congestive heart failure   Hypertension   Diabetes   Iron deficiency anemia   Dementia   Chronic kidney disease (CKD), stage IV (severe)   Choking episode    Time spent: 35 mins    Kimberly Filler MD  [facility] Pager [phone]. If 7PM-7AM, please contact night-coverage at [email or web address], password [facility] 04/28/2014, 10:07 AM  LOS: 1 day

## 2014-04-28 NOTE — Progress Notes (Signed)
Inpatient Diabetes Program Recommendations  AACE/ADA: New Consensus Statement on Inpatient Glycemic Control (2013)  Target Ranges:  Prepandial:   less than 140 mg/dL      Peak postprandial:   less than 180 mg/dL (1-2 hours)      Critically ill patients:  140 - 180 mg/dL   Results for [name], [name] (MRN [id number]) as of 04/28/2014 07:54  Ref. Range 04/27/2014 15:36 04/28/2014 05:52  Glucose Latest Range: 70-99 mg/dL 211 (H) 257 (H)   Diabetes history: DM2 Outpatient Diabetes medications: Levemir 12 units QHS, Tradjenta 5 mg QAM Current orders for Inpatient glycemic control: Novolog 0-9 AC, Novolog 0-5 units HS, Novolog 3 units TID with meals  Inpatient Diabetes Program Recommendations Insulin - Basal: Please consider ordering low dose Levemir; recommend starting with Levemir 5 units Q24H.  Thanks, Barnie Alderman, RN, MSN, CCRN Diabetes Coordinator Inpatient Diabetes Program [phone] (Team Pager) [phone] ([facility] office) [phone] [facility] office)

## 2014-04-28 NOTE — Care Management Note (Signed)
    Page 1 of 1   05/02/2014     3:32:42 PM CARE MANAGEMENT NOTE 05/02/2014  Patient:  [name], [name]   Account Number:  [id number]  Date Initiated:  04/28/2014  Documentation initiated by:  Claretha Cooper  Subjective/Objective Assessment:   Pt admitted form home where she lives with her daughter. Active with [facility]. Will need PT and RN at DC. No DME needs identified.     Action/Plan:   Anticipated DC Date:  05/02/2014   Anticipated DC Plan:  [place]  CM consult      [facility] Choice  Resumption Of Svcs/PTA Provider   Choice offered to / List presented to:  [name]        [place] arranged  HH-1 RN  [place] [facility]      [place].   Status of service:  Completed, signed off Medicare Important Message given?  YES (If response is "NO", the following Medicare IM given date fields will be blank) Date Medicare IM given:  05/02/2014 Date Additional Medicare IM given:    Discharge Disposition:  [place]  Per UR Regulation:    If discussed at Long Length of Stay Meetings, dates discussed:   05/02/2014    Comments:  04/28/14 Claretha Cooper RN CM

## 2014-04-28 NOTE — Evaluation (Signed)
Physical Therapy Evaluation Patient Details Name: [name] MRN: [id number] DOB: 1941/04/06 Today's Date: 04/28/2014   History of Present Illness  This is a 73 year old moderately demented lady who has chronic kidney disease with a baseline creatinine of around 2.2, who sees nephrology, who presents with a choking episode at home approximately 3-4 hours ago followed by dyspnea. According to the daughter, who is at the bedside, she had a choking episode after having a drink of some sort. She denies any fever. The patient herself is unable to give me a clear history due to her dementia. The daughter describes leg swelling as well as dyspnea today. Evaluation in the emergency room showed worsening anemia with a hemoglobin of 6.8. Previously her hemoglobin had been in the 8 range. She is now being admitted for further investigation and management.  Clinical Impression  Patient presents to PT after referral from MD for assessment of mobility skills.  Information received from patient, as family not present during evaluation, though patient is a questionable historian due to dementia.  Patient lives with her family, who she is reports is available 24/7 to assist as needed.  Patient states she required assistance/supervision with mobility skills at home, and uses a rolling walker for ambulation.  During evaluation, patient required supervision for mobility skills, and min guard/VC for safe transfer skills.  Patient was able to ambulate in room x20 feet with RW and min guard; further ambulation/stairs not assessed due to O2 attachment to wall today.  Recommended continued PT services while patient is in hospital to reinforce safe transfers and progress gait/stair climbing to tolerance as patient lives on the second floor.  With discharge, recommend HHPT to continue strengthening/activity tolerance and home assessment for stair climbing.  Patient has appropriate DME required to return home.     Follow Up  Recommendations Home health PT    Equipment Recommendations  None recommended by PT (Patient has all equiptment needed for d/c)       Precautions / Restrictions Precautions Precautions: Fall (Patient reports 1 fall in the previous year, though patient is a questionable historian) Restrictions Weight Bearing Restrictions: No      Mobility  Bed Mobility Overal bed mobility: Modified Independent                Transfers Overall transfer level: Modified independent Equipment used: Rolling walker (2 wheeled) Transfers: Sit to/from [facility] Sit to Stand: Min guard Stand pivot transfers: Min guard       General transfer comment: VC required for hand placement during sit <-> stand transfers  Ambulation/Gait Ambulation/Gait assistance: Min guard Ambulation Distance (Feet): 20 Feet Assistive device: Rolling walker (2 wheeled) Gait Pattern/deviations: WFL(Within Functional Limits)   Gait velocity interpretation: at or above normal speed for age/gender                   Balance Overall balance assessment: Needs assistance (Min guard for standing balance for safety) Sitting-balance support: No upper extremity supported;Feet supported Sitting balance-Leahy Scale: Good     Standing balance support: Bilateral upper extremity supported (On RW) Standing balance-Leahy Scale: Good                               Pertinent Vitals/Pain No pain reported.     Home Living Family/patient expects to be discharged to:: Private residence Living Arrangements: Other (Comment) (Lives with daughter and granddaughter) Available Help at Discharge: Family Type of  Home: Apartment Home Access: Stairs to enter Entrance Stairs-Rails: Can reach both Entrance Stairs-Number of Steps: 10 Home Layout: One level Home Equipment: Walker - 2 wheels;Shower seat      Prior Function Level of Independence: Needs assistance   Gait / Transfers Assistance  Needed:  (Patient uses a RW for ambulation, and per patient reports supervision/min guard for transfers and gait)  ADL's / Homemaking Assistance Needed: Pt reports that her daughter assists with 'everything,' both BADLs and IADLs.        Hand Dominance   Dominant Hand: Right    Extremity/Trunk Assessment   Upper Extremity Assessment: Defer to OT evaluation           Lower Extremity Assessment: LLE deficits/detail;RLE deficits/detail RLE Deficits / Details: Hip flexor 3+/5, Knee extensor/flexor 4-/5 LLE Deficits / Details: Hip flexor 3/5, Knee extensor/flexor 4-/5     Communication   Communication: No difficulties  Cognition Arousal/Alertness: Awake/alert Behavior During Therapy: WFL for tasks assessed/performed Overall Cognitive Status: No family/caregiver present to determine baseline cognitive functioning                                   PT Assessment Patient needs continued PT services  PT Diagnosis Generalized weakness   PT Problem List Decreased strength;Decreased activity tolerance;Decreased balance;Decreased mobility;Decreased safety awareness  PT Treatment Interventions Gait training;Neuromuscular re-education;Balance training;Stair training;Therapeutic activities;Therapeutic exercise;Functional mobility training   PT Goals (Current goals can be found in the Care Plan section) Acute Rehab PT Goals Patient Stated Goal: No OT goals needed PT Goal Formulation: With patient Time For Goal Achievement: 05/05/14 Potential to Achieve Goals: Good    Frequency Min 3X/week           End of Session Equipment Utilized During Treatment: Gait belt Activity Tolerance: Patient tolerated treatment well Patient left: in bed;with bed alarm set;with call bell/phone within reach           Time: 0920-0945 PT Time Calculation (min): 25 min   Charges:   PT Evaluation $Initial PT Evaluation Tier I: 1 Procedure          Lonna Cobb 04/28/2014,  9:51 AM

## 2014-04-29 DIAGNOSIS — R6889 Other general symptoms and signs: Secondary | ICD-10-CM

## 2014-04-29 LAB — GLUCOSE, CAPILLARY
GLUCOSE-CAPILLARY: 176 mg/dL — AB (ref 70–99)
GLUCOSE-CAPILLARY: 138 mg/dL — AB (ref 70–99)
Glucose-Capillary: 139 mg/dL — ABNORMAL HIGH (ref 70–99)
Glucose-Capillary: 166 mg/dL — ABNORMAL HIGH (ref 70–99)
Glucose-Capillary: 151 mg/dL — ABNORMAL HIGH (ref 70–99)

## 2014-04-29 LAB — TYPE AND SCREEN
ABO/RH(D): A POS
Antibody Screen: NEGATIVE
UNIT DIVISION: 0
Unit division: 0

## 2014-04-29 LAB — CBC
HEMATOCRIT: 27.1 % — AB (ref 36.0–46.0)
HEMOGLOBIN: 8.7 g/dL — AB (ref 12.0–15.0)
MCH: 25.5 pg — ABNORMAL LOW (ref 26.0–34.0)
MCHC: 32.1 g/dL (ref 30.0–36.0)
MCV: 79.5 fL (ref 78.0–100.0)
Platelets: 295 10*3/uL (ref 150–400)
RBC: 3.41 MIL/uL — ABNORMAL LOW (ref 3.87–5.11)
RDW: 16.9 % — ABNORMAL HIGH (ref 11.5–15.5)
WBC: 7.8 10*3/uL (ref 4.0–10.5)

## 2014-04-29 LAB — FERRITIN: FERRITIN: 52 ng/mL (ref 10–291)

## 2014-04-29 LAB — BASIC METABOLIC PANEL
BUN: 88 mg/dL — AB (ref 6–23)
CHLORIDE: 105 meq/L (ref 96–112)
CO2: 17 meq/L — AB (ref 19–32)
Calcium: 8.5 mg/dL (ref 8.4–10.5)
Creatinine, Ser: 3.75 mg/dL — ABNORMAL HIGH (ref 0.50–1.10)
GFR calc Af Amer: 13 mL/min — ABNORMAL LOW (ref >90)
GFR calc non Af Amer: 11 mL/min — ABNORMAL LOW (ref >90)
GLUCOSE: 142 mg/dL — AB (ref 70–99)
POTASSIUM: 4.3 meq/L (ref 3.7–5.3)
Sodium: 138 mEq/L (ref 137–147)

## 2014-04-29 LAB — IRON AND TIBC
IRON: 17 ug/dL — AB (ref 42–135)
Saturation Ratios: 5 % — ABNORMAL LOW (ref 20–55)
TIBC: 311 ug/dL (ref 250–470)
UIBC: 294 ug/dL (ref 125–400)

## 2014-04-29 LAB — PHOSPHORUS: PHOSPHORUS: 5 mg/dL — AB (ref 2.3–4.6)

## 2014-04-29 MED ORDER — TORSEMIDE 20 MG PO TABS
40.0000 mg | ORAL_TABLET | Freq: Every day | ORAL | Status: DC
  Administered 2014-04-29 – 2014-05-01 (×3): 40 mg via ORAL
  Filled 2014-04-29 (×3): qty 2

## 2014-04-29 NOTE — Progress Notes (Signed)
Pharmacist Heart Failure Core Measure Documentation  Assessment: [name] has an EF documented as 40-45% on 01/17/2014 by 2D echo.  Rationale: Heart failure patients with left ventricular systolic dysfunction (LVSD) and an EF < 40% should be prescribed an angiotensin converting enzyme inhibitor (ACEI) or angiotensin receptor blocker (ARB) at discharge unless a contraindication is documented in the medical record.  This patient is not currently on an ACEI or ARB for HF.  This note is being placed in the record in order to provide documentation that a contraindication to the use of these agents is present for this encounter.  ACE Inhibitor or Angiotensin Receptor Blocker is contraindicated (specify all that apply)  []   ACEI allergy AND ARB allergy []   Angioedema []   Moderate or severe aortic stenosis []   Hyperkalemia []   Hypotension []   Renal artery stenosis [x]   Worsening renal function, preexisting renal disease or dysfunction   [name] 04/29/2014 12:22 PM

## 2014-04-29 NOTE — Progress Notes (Signed)
[facility] PROGRESS NOTE  [name] ZOX:[id number] DOB: [birth year] DOA: 04/27/2014 PCP: [name], MD  Assessment/Plan: #1 symptomatic anemia Likely secondary to chronic kidney disease. Patient with no overt GI bleed. Patient is status post 2 units of packed red blood cells with appropriate response with hemoglobin currently at 8.7. Anemia panel has been ordered by nephrology and is pending. Follow.  #2 probable acute on chronic systolic CHF Patient is noted to be volume overloaded on clinical exam with lower extremity edema, JVD, some crackles on lung exam. Patient with clinical improvement with diuresis. I/o -470. Diuretic dose change to oral Demadex per nephrology.   #3 acute on chronic kidney disease stage IV versus progressive chronic kidney disease Likely secondary to problem #2. Patient's chronic kidney disease secondary to diabetes, hypertension. Patient creatinine has increased further. Likely secondary to aggressive diuresis. IV diuretics has been changed to oral Demadex per nephrology. Monitor renal function. Nephrology following and appreciate input and recommendations.  #4 acidosis Secondary to problem #3. Improvement. Continue bicarbonate tablets. Per nephrology.  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 Continue aspirin, hydralazine,imdur, Toprol-XL, Zocor.  #6 history of seizure disorder Stable. Continue Keppra.  #7 history of CVA Stable. Continue Aggrenox for secondary stroke prevention. Continue risk factor modification.  #8 choking Patient has been assessed by speech therapy and patient recommended to be on a regular diet. Continue current renal diet..  #9 diabetes mellitus CBGs have ranged from 122-176. Continue Levemir 5 units daily. Sliding scale insulin.  #10 moderate dementia Stable. Follow.  #11 prophylaxis Protonix for GI prophylaxis. Lovenox for DVT prophylaxis.  Code Status: Full Family Communication: Updated patient no  family at bedside. Disposition Plan: Home when medically stable.   Consultants:  Nephrology: Dr. Hinda Lenis 04/28/2014  Procedures:  Chest x-ray 04/27/2014  2 units packed red blood cells 04/27/2014  Antibiotics:  None  HPI/Subjective: Patient denies any further choking episodes. Patient states shortness of breath has improved. No complaints  Objective: Filed Vitals:   04/29/14 0522  BP: 134/61  Pulse: 63  Temp: 98.2 F (36.8 C)  Resp: 18    Intake/Output Summary (Last 24 hours) at 04/29/14 1117 Last data filed at 04/28/14 2038  Gross per 24 hour  Intake    240 ml  Output    250 ml  Net    -10 ml   Filed Weights   04/27/14 1455 04/27/14 1853  Weight: 69.854 kg (154 lb) 72.439 kg (159 lb 11.2 oz)    Exam:   General:  NAD  Cardiovascular: RRR. Positive JVD  Respiratory: CTAB  Abdomen: Soft, nontender, nondistended, positive bowel sounds.  Musculoskeletal:  1-2+ bilateral lower extremity edema.   Data Reviewed: Basic Metabolic Panel:  Recent Labs Lab 04/27/14 1536 04/28/14 0552 04/29/14 0615  NA 142 140 138  K 4.2 4.5 4.3  CL 108 105 105  CO2 16* 14* 17*  GLUCOSE 211* 257* 142*  BUN 80* 76* 88*  CREATININE 3.26* 3.00* 3.75*  CALCIUM 8.7 8.8 8.5  PHOS  --   --  5.0*   Liver Function Tests:  Recent Labs Lab 04/27/14 1536 04/28/14 0552  AST 32 24  ALT 16 12  ALKPHOS 79 69  BILITOT 0.3 0.7  PROT 8.4* 8.2  ALBUMIN 2.6* 2.6*   No results found for this basename: LIPASE, AMYLASE,  in the last 168 hours No results found for this basename: AMMONIA,  in the last 168 hours CBC:  Recent Labs Lab 04/27/14 1536 04/28/14 0552 04/29/14  0615  WBC 7.3 11.5* 7.8  NEUTROABS 5.5  --   --   HGB 6.8* 8.9* 8.7*  HCT 22.4* 28.1* 27.1*  MCV 81.2 80.5 79.5  PLT 335 281 295   Cardiac Enzymes:  Recent Labs Lab 04/27/14 1536  TROPONINI <0.30   BNP (last 3 results)  Recent Labs  02/22/14 0226 03/15/14 1645 04/27/14 1536  PROBNP 38775.0*  37134.0* 51631.0*   CBG:  Recent Labs Lab 04/28/14 0808 04/28/14 1125 04/28/14 1643 04/28/14 2116 04/29/14 0727  GLUCAP 227* 209* 183* 122* 139*    No results found for this or any previous visit (from the past 240 hour(s)).   Studies: Dg Chest 2 View  04/27/2014   CLINICAL DATA:  CHOKING SHORTNESS OF BREATH  EXAM: CHEST  2 VIEW  COMPARISON:  Two-view chest 03/15/2014  FINDINGS: Cardiac silhouette is enlarged. Stable median sternotomy changes. Lungs clear. No acute osseous abnormalities. Mild degenerative changes within the shoulders.  IMPRESSION: No active cardiopulmonary disease.   Electronically Signed   By: Margaree Mackintosh M.D.   On: 04/27/2014 15:16    Scheduled Meds: . antiseptic oral rinse  15 mL Mouth Rinse BID  . aspirin EC  81 mg Oral Daily  . dipyridamole-aspirin  1 capsule Oral BID  . donepezil  10 mg Oral Daily  . enoxaparin (LOVENOX) injection  30 mg Subcutaneous Q24H  . folic acid  1 mg Oral Daily  . hydrALAZINE  75 mg Oral 3 times per day  . insulin aspart  0-5 Units Subcutaneous QHS  . insulin aspart  0-9 Units Subcutaneous TID WC  . insulin aspart  3 Units Subcutaneous TID WC  . insulin detemir  5 Units Subcutaneous Daily  . isosorbide mononitrate  15 mg Oral Daily  . levETIRAcetam  500 mg Oral BID  . metoprolol succinate  100 mg Oral Daily  . mupirocin ointment  1 application Topical BID  . pantoprazole  40 mg Oral Daily  . potassium chloride  20 mEq Oral Daily  . simvastatin  10 mg Oral q1800  . sodium bicarbonate  650 mg Oral TID  . torsemide  40 mg Oral Daily  . [START ON 05/02/2014] Vitamin D (Ergocalciferol)  50,000 Units Oral Q30 days   Continuous Infusions:   Principal Problem:   Anemia Active Problems:   Acute on chronic renal failure   Acute on chronic combined systolic and diastolic congestive heart failure   Hypertension   Diabetes   Iron deficiency anemia   Dementia   Chronic kidney disease (CKD), stage IV (severe)   Choking  episode    Time spent: 35 mins    [name]  [facility] Pager [phone]. If 7PM-7AM, please contact night-coverage at [email or web address], password [facility] 04/29/2014, 11:17 AM  LOS: 2 days

## 2014-04-29 NOTE — Progress Notes (Signed)
Subjective: Interval History: has no complaint of no difficulty in breathing.. Patient denies any nausea no vomiting. Presently she doesn't offer any complaints..  Objective: Vital signs in last 24 hours: Temp:  [98 F (36.7 C)-98.4 F (36.9 C)] 98.2 F (36.8 C) (06/06 0522) Pulse Rate:  [63-72] 63 (06/06 0522) Resp:  [18-20] 18 (06/06 0522) BP: (105-134)/(53-61) 134/61 mmHg (06/06 0522) SpO2:  [98 %-100 %] 100 % (06/06 0522) Weight change:   Intake/Output from previous day: 06/05 0701 - 06/06 0700 In: 240 [P.O.:240] Out: 800 [Urine:800] Intake/Output this shift:    General appearance: alert, cooperative and no distress Resp: diminished breath sounds posterior - bilateral Cardio: regular rate and rhythm, S1, S2 normal, no murmur, click, rub or gallop GI: soft, non-tender; bowel sounds normal; no masses,  no organomegaly Extremities: edema Trace to 1+ edema  Lab Results:  Recent Labs  04/28/14 0552 04/29/14 0615  WBC 11.5* 7.8  HGB 8.9* 8.7*  HCT 28.1* 27.1*  PLT 281 295   BMET:  Recent Labs  04/28/14 0552 04/29/14 0615  NA 140 138  K 4.5 4.3  CL 105 105  CO2 14* 17*  GLUCOSE 257* 142*  BUN 76* 88*  CREATININE 3.00* 3.75*  CALCIUM 8.8 8.5   No results found for this basename: PTH,  in the last 72 hours Iron Studies: No results found for this basename: IRON, TIBC, TRANSFERRIN, FERRITIN,  in the last 72 hours  Studies/Results: Dg Chest 2 View  04/27/2014   CLINICAL DATA:  CHOKING SHORTNESS OF BREATH  EXAM: CHEST  2 VIEW  COMPARISON:  Two-view chest 03/15/2014  FINDINGS: Cardiac silhouette is enlarged. Stable median sternotomy changes. Lungs clear. No acute osseous abnormalities. Mild degenerative changes within the shoulders.  IMPRESSION: No active cardiopulmonary disease.   Electronically Signed   By: Margaree Mackintosh M.D.   On: 04/27/2014 15:16    I have reviewed the patient's current medications.  Assessment/Plan: Problem #1 renal failure: Her BUN is 88  and creatinine 3.75. Her renal function has this moment seems to be getting worse. Patient doesn't have any uremic sinus symptoms. This could be from fluid removal.  Problem #2 low CO2: Possibly metabolic. Her CO2 is 17 and improving. Problem #3 anemia: Possibly combination of iron deficiency anemia and anemia of chronic disease. Presently iron studies are pending.  Problem #4 hypertension: Her blood pressure reasonably controlled Problem #5 diabetes Problem #6 metabolic bone disease: Calcium in the range but her her phosphorus is pending. Problem #7 history of CHF: Patient on Lasix presently seems to be improving. Patient is a symptomatic. Problem #8 history of CVA Plan: We'll DC Lasix Will start patient on Demadex 40 mg by mouth daily We'll check her basic metabolic panel in the morning. Patient has this moment does not need dialysis.   LOS: 2 days   Harriett Sine 04/29/2014,8:02 AM

## 2014-04-30 LAB — BASIC METABOLIC PANEL
BUN: 95 mg/dL — AB (ref 6–23)
CO2: 18 mEq/L — ABNORMAL LOW (ref 19–32)
Calcium: 8.8 mg/dL (ref 8.4–10.5)
Chloride: 103 mEq/L (ref 96–112)
Creatinine, Ser: 3.47 mg/dL — ABNORMAL HIGH (ref 0.50–1.10)
GFR, EST AFRICAN AMERICAN: 14 mL/min — AB (ref >90)
GFR, EST NON AFRICAN AMERICAN: 12 mL/min — AB (ref >90)
Glucose, Bld: 168 mg/dL — ABNORMAL HIGH (ref 70–99)
POTASSIUM: 4.4 meq/L (ref 3.7–5.3)
Sodium: 137 mEq/L (ref 137–147)

## 2014-04-30 LAB — GLUCOSE, CAPILLARY
GLUCOSE-CAPILLARY: 161 mg/dL — AB (ref 70–99)
GLUCOSE-CAPILLARY: 92 mg/dL (ref 70–99)
Glucose-Capillary: 185 mg/dL — ABNORMAL HIGH (ref 70–99)
Glucose-Capillary: 124 mg/dL — ABNORMAL HIGH (ref 70–99)

## 2014-04-30 LAB — CBC
HCT: 29.3 % — ABNORMAL LOW (ref 36.0–46.0)
Hemoglobin: 9.1 g/dL — ABNORMAL LOW (ref 12.0–15.0)
MCH: 25.1 pg — ABNORMAL LOW (ref 26.0–34.0)
MCHC: 31.1 g/dL (ref 30.0–36.0)
MCV: 80.7 fL (ref 78.0–100.0)
Platelets: 313 10*3/uL (ref 150–400)
RBC: 3.63 MIL/uL — ABNORMAL LOW (ref 3.87–5.11)
RDW: 17.3 % — ABNORMAL HIGH (ref 11.5–15.5)
WBC: 7.6 10*3/uL (ref 4.0–10.5)

## 2014-04-30 MED ORDER — FERUMOXYTOL INJECTION 510 MG/17 ML
510.0000 mg | Freq: Once | INTRAVENOUS | Status: AC
  Administered 2014-04-30: 510 mg via INTRAVENOUS
  Filled 2014-04-30: qty 17

## 2014-04-30 NOTE — Progress Notes (Signed)
Subjective: Interval History: Patient offers o complaint.Cough is better and no difficulty in breathing  Objective: Vital signs in last 24 hours: Temp:  [97.5 F (36.4 C)-98.6 F (37 C)] 98 F (36.7 C) (06/07 0602) Pulse Rate:  [58-78] 78 (06/07 0602) Resp:  [18-20] 20 (06/07 0602) BP: (132-154)/(45-72) 154/72 mmHg (06/07 0602) SpO2:  [97 %-100 %] 97 % (06/07 0602) Weight:  [70.761 kg (156 lb)] 70.761 kg (156 lb) (06/07 0840) Weight change:   Intake/Output from previous day: 06/06 0701 - 06/07 0700 In: 240 [P.O.:240] Out: 1100 [Urine:1100] Intake/Output this shift:    Generally she is alert and in no apparent distress Chest decrease breath sound bilaterally and no rales or rhonchi Heart regular rate and rhythm no murmur Abdomen positive bowl sound and none tender Extremities trace edema on the left and 1+ edema on the right  Lab Results:  Recent Labs  04/29/14 0615 04/30/14 0621  WBC 7.8 7.6  HGB 8.7* 9.1*  HCT 27.1* 29.3*  PLT 295 313   BMET:   Recent Labs  04/29/14 0615 04/30/14 0621  NA 138 137  K 4.3 4.4  CL 105 103  CO2 17* 18*  GLUCOSE 142* 168*  BUN 88* 95*  CREATININE 3.75* 3.47*  CALCIUM 8.5 8.8   No results found for this basename: PTH,  in the last 72 hours Iron Studies:   Recent Labs  04/29/14 0615  IRON 17*  TIBC 311  FERRITIN 52    Studies/Results: No results found.  I have reviewed the patient's current medications.  Assessment/Plan: Problem #1 renal failure: Her BUN is 95 and creatinine 3.47. Her renal function is stable.. Patient doesn't have any uremic sinus symptoms. This could be from fluid removal.  Problem #2 low CO2: Possibly metabolic. Her CO2 is 18 and improving. Problem #3 anemia: Possibly combination of iron deficiency anemia . Her iron saturation is 5% and ferritin is 52 Problem #4 hypertension: Her blood pressure reasonably controlled Problem #5 diabetes Problem #6 metabolic bone disease: Calcium in the range  but her phosphorus is slightly high Problem #7 history of CHF: Patient on Demadex . None oliguric presently seems to be improving. Problem #8 history of CVA Plan: Continue with Demadex FERAHEME 510 mg iv today    LOS: 3 days   Rhyland Hinderliter S Cleotis Sparr 04/30/2014,8:50 AM

## 2014-04-30 NOTE — Progress Notes (Signed)
[facility] PROGRESS NOTE  [name] OFB:[id number] DOB: October 10, 1941 DOA: 04/27/2014 PCP: [name], MD  Assessment/Plan: #1 symptomatic anemia Likely secondary to chronic kidney disease. Patient with no overt GI bleed. Patient is status post 2 units of packed red blood cells with appropriate response with hemoglobin currently at 9.1. Anemia panel c/w iron deficiency anemia/AOCD. Follow.  #2 probable acute on chronic systolic CHF Patient is noted to be volume overloaded on clinical exam with lower extremity edema, JVD, some crackles on lung exam. Patient with clinical improvement with diuresis. I/o -1330. Continue oral Demadex per nephrology.   #3 acute on chronic kidney disease stage IV versus progressive chronic kidney disease Likely secondary to problem #2. Patient's chronic kidney disease secondary to diabetes, hypertension. Patient creatinine trending back down with change to oral diuretics. Likely secondary to aggressive diuresis. IV diuretics has been changed to oral Demadex per nephrology. Monitor renal function. Nephrology following and appreciate input and recommendations.  #4 acidosis Secondary to problem #3. Improvement. Continue bicarbonate tablets. Per nephrology.  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 Continue aspirin, hydralazine,imdur, Toprol-XL, Zocor.  #6 history of seizure disorder Stable. Continue Keppra.  #7 history of CVA Stable. Continue Aggrenox for secondary stroke prevention. Continue risk factor modification.  #8 choking Patient has been assessed by speech therapy and patient recommended to be on a regular diet. No further episodes. Continue current renal diet..  #9 diabetes mellitus CBGs have ranged from 151-185. Continue Levemir 5 units daily. Sliding scale insulin.  #10 moderate dementia Stable. Follow.  #11 prophylaxis Protonix for GI prophylaxis. Lovenox for DVT prophylaxis.  Code Status: Full Family Communication:  Updated patient and daughter at bedside. Disposition Plan: Home when medically stable.   Consultants:  Nephrology: Dr. Hinda Lenis 04/28/2014  Procedures:  Chest x-ray 04/27/2014  2 units packed red blood cells 04/27/2014  Antibiotics:  None  HPI/Subjective: Patient denies any further choking episodes. Patient states shortness of breath has improved. No complaints  Objective: Filed Vitals:   04/30/14 0602  BP: 154/72  Pulse: 78  Temp: 98 F (36.7 C)  Resp: 20    Intake/Output Summary (Last 24 hours) at 04/30/14 1145 Last data filed at 04/30/14 1011  Gross per 24 hour  Intake    480 ml  Output   1300 ml  Net   -820 ml   Filed Weights   04/27/14 1455 04/27/14 1853 04/30/14 0840  Weight: 69.854 kg (154 lb) 72.439 kg (159 lb 11.2 oz) 70.761 kg (156 lb)    Exam:   General:  NAD  Cardiovascular: RRR.  Respiratory: CTAB  Abdomen: Soft, nontender, nondistended, positive bowel sounds.  Musculoskeletal:  1+ bilateral lower extremity edema.   Data Reviewed: Basic Metabolic Panel:  Recent Labs Lab 04/27/14 1536 04/28/14 0552 04/29/14 0615 04/30/14 0621  NA 142 140 138 137  K 4.2 4.5 4.3 4.4  CL 108 105 105 103  CO2 16* 14* 17* 18*  GLUCOSE 211* 257* 142* 168*  BUN 80* 76* 88* 95*  CREATININE 3.26* 3.00* 3.75* 3.47*  CALCIUM 8.7 8.8 8.5 8.8  PHOS  --   --  5.0*  --    Liver Function Tests:  Recent Labs Lab 04/27/14 1536 04/28/14 0552  AST 32 24  ALT 16 12  ALKPHOS 79 69  BILITOT 0.3 0.7  PROT 8.4* 8.2  ALBUMIN 2.6* 2.6*   No results found for this basename: LIPASE, AMYLASE,  in the last 168 hours No results found for this basename: AMMONIA,  in the last 168 hours CBC:  Recent Labs Lab 04/27/14 1536 04/28/14 0552 04/29/14 0615 04/30/14 0621  WBC 7.3 11.5* 7.8 7.6  NEUTROABS 5.5  --   --   --   HGB 6.8* 8.9* 8.7* 9.1*  HCT 22.4* 28.1* 27.1* 29.3*  MCV 81.2 80.5 79.5 80.7  PLT 335 281 295 313   Cardiac Enzymes:  Recent Labs Lab  04/27/14 1536  TROPONINI <0.30   BNP (last 3 results)  Recent Labs  02/22/14 0226 03/15/14 1645 04/27/14 1536  PROBNP 38775.0* 37134.0* 51631.0*   CBG:  Recent Labs Lab 04/29/14 1712 04/29/14 2203 04/29/14 2330 04/30/14 0720 04/30/14 1112  GLUCAP 138* 166* 151* 161* 185*    No results found for this or any previous visit (from the past 240 hour(s)).   Studies: No results found.  Scheduled Meds: . antiseptic oral rinse  15 mL Mouth Rinse BID  . aspirin EC  81 mg Oral Daily  . dipyridamole-aspirin  1 capsule Oral BID  . donepezil  10 mg Oral Daily  . enoxaparin (LOVENOX) injection  30 mg Subcutaneous Q24H  . folic acid  1 mg Oral Daily  . hydrALAZINE  75 mg Oral 3 times per day  . insulin aspart  0-5 Units Subcutaneous QHS  . insulin aspart  0-9 Units Subcutaneous TID WC  . insulin aspart  3 Units Subcutaneous TID WC  . insulin detemir  5 Units Subcutaneous Daily  . isosorbide mononitrate  15 mg Oral Daily  . levETIRAcetam  500 mg Oral BID  . metoprolol succinate  100 mg Oral Daily  . mupirocin ointment  1 application Topical BID  . pantoprazole  40 mg Oral Daily  . potassium chloride  20 mEq Oral Daily  . simvastatin  10 mg Oral q1800  . sodium bicarbonate  650 mg Oral TID  . torsemide  40 mg Oral Daily  . [START ON 05/02/2014] Vitamin D (Ergocalciferol)  50,000 Units Oral Q30 days   Continuous Infusions:   Principal Problem:   Anemia Active Problems:   Acute on chronic renal failure   Acute on chronic combined systolic and diastolic congestive heart failure   Hypertension   Diabetes   Iron deficiency anemia   Dementia   Chronic kidney disease (CKD), stage IV (severe)   Choking episode    Time spent: 35 mins    [name]  [facility] Pager [phone]. If 7PM-7AM, please contact night-coverage at [email or web address], password [facility] 04/30/2014, 11:45 AM  LOS: 3 days

## 2014-05-01 LAB — BASIC METABOLIC PANEL
BUN: 87 mg/dL — ABNORMAL HIGH (ref 6–23)
CO2: 21 mEq/L (ref 19–32)
Calcium: 8.9 mg/dL (ref 8.4–10.5)
Chloride: 104 mEq/L (ref 96–112)
Creatinine, Ser: 3.02 mg/dL — ABNORMAL HIGH (ref 0.50–1.10)
GFR, EST AFRICAN AMERICAN: 17 mL/min — AB (ref >90)
GFR, EST NON AFRICAN AMERICAN: 14 mL/min — AB (ref >90)
Glucose, Bld: 129 mg/dL — ABNORMAL HIGH (ref 70–99)
POTASSIUM: 4.2 meq/L (ref 3.7–5.3)
SODIUM: 140 meq/L (ref 137–147)

## 2014-05-01 LAB — CBC
HEMATOCRIT: 28.3 % — AB (ref 36.0–46.0)
HEMOGLOBIN: 8.8 g/dL — AB (ref 12.0–15.0)
MCH: 25.1 pg — ABNORMAL LOW (ref 26.0–34.0)
MCHC: 31.1 g/dL (ref 30.0–36.0)
MCV: 80.6 fL (ref 78.0–100.0)
Platelets: 283 10*3/uL (ref 150–400)
RBC: 3.51 MIL/uL — AB (ref 3.87–5.11)
RDW: 17.6 % — ABNORMAL HIGH (ref 11.5–15.5)
WBC: 4.6 10*3/uL (ref 4.0–10.5)

## 2014-05-01 LAB — GLUCOSE, CAPILLARY
GLUCOSE-CAPILLARY: 104 mg/dL — AB (ref 70–99)
GLUCOSE-CAPILLARY: 141 mg/dL — AB (ref 70–99)
Glucose-Capillary: 128 mg/dL — ABNORMAL HIGH (ref 70–99)
Glucose-Capillary: 239 mg/dL — ABNORMAL HIGH (ref 70–99)
Glucose-Capillary: 137 mg/dL — ABNORMAL HIGH (ref 70–99)

## 2014-05-01 LAB — PTH, INTACT AND CALCIUM
CALCIUM TOTAL (PTH): 8.3 mg/dL — AB (ref 8.4–10.5)
PTH: 268.4 pg/mL — AB (ref 14.0–72.0)

## 2014-05-01 MED ORDER — TORSEMIDE 20 MG PO TABS
50.0000 mg | ORAL_TABLET | Freq: Every day | ORAL | Status: DC
  Administered 2014-05-02: 50 mg via ORAL
  Filled 2014-05-01: qty 3

## 2014-05-01 NOTE — Progress Notes (Signed)
Subjective: Interval History: Patient offers o complaint. Denies any difficulty in breathing  Objective: Vital signs in last 24 hours: Temp:  [97.5 F (36.4 C)-98.4 F (36.9 C)] 97.5 F (36.4 C) (06/08 0513) Pulse Rate:  [69-80] 80 (06/08 0513) Resp:  [20-22] 20 (06/08 0513) BP: (148-154)/(59-72) 154/72 mmHg (06/08 0513) SpO2:  [98 %-99 %] 99 % (06/08 0513) Weight:  [68.13 kg (150 lb 3.2 oz)] 68.13 kg (150 lb 3.2 oz) (06/08 0526) Weight change:   Intake/Output from previous day: 06/07 0701 - 06/08 0700 In: 720 [P.O.:720] Out: 200 [Urine:200] Intake/Output this shift:    Generally she is alert and in no apparent distress Chest decrease breath sound bilaterally  Heart regular rate and rhythm no murmur Abdomen positive bowl sound and none tender Extremities Bilateral 1+edema  Lab Results:  Recent Labs  04/30/14 0621 05/01/14 0717  WBC 7.6 4.6  HGB 9.1* 8.8*  HCT 29.3* 28.3*  PLT 313 283   BMET:   Recent Labs  04/30/14 0621 05/01/14 0717  NA 137 140  K 4.4 4.2  CL 103 104  CO2 18* 21  GLUCOSE 168* 129*  BUN 95* 87*  CREATININE 3.47* 3.02*  CALCIUM 8.8 8.9   No results found for this basename: PTH,  in the last 72 hours Iron Studies:   Recent Labs  04/29/14 0615  IRON 17*  TIBC 311  FERRITIN 52    Studies/Results: No results found.  I have reviewed the patient's current medications.  Assessment/Plan: Problem #1 renal failure: Her BUN is 95 and creatinine 3.02. Her renal function is improving.. Patient doesn't have any uremic sinus symptoms.   Problem #2 low CO2: Possibly metabolic. Her CO2 is 21 and improving. Problem #3 anemia: s/p iron supplement her hemoglobin is declining Problem #4 hypertension: Her blood pressure reasonably controlled Problem #5 diabetes Problem #6 metabolic bone disease: Calcium in the range but her phosphorus is slightly high Problem #7 history of CHF: Patient on Demadex . None oliguric but still she has edema Problem  #8 history of CVA Plan: increase  Demadex to 50 mg po once a day Basic metabolic panel in am     LOS: 4 days   Jovita Persing S Casey Maxfield 05/01/2014,9:00 AM

## 2014-05-01 NOTE — Progress Notes (Signed)
Patient continues to get out of bed with calling for help. Continues to set off bed alarm and when i remind and ask why she did not call she says she forgot every time. Patient is unstable on her feel and forgetful. Has urgency with urination. William Hamburger RN

## 2014-05-01 NOTE — Progress Notes (Signed)
Patient lying in bed resting quietly with eyes closed at this time. No complaints voiced.

## 2014-05-01 NOTE — Progress Notes (Signed)
[facility] PROGRESS NOTE  [name] WPY:[id number] DOB: 1941-07-07 DOA: 04/27/2014 PCP: [name], MD  Assessment/Plan: #1 symptomatic anemia Likely secondary to chronic kidney disease. Patient with no overt GI bleed. Patient is status post 2 units of packed red blood cells with appropriate response with hemoglobin currently at 8.8. Anemia panel c/w iron deficiency anemia/AOCD. Follow.  #2 probable acute on chronic systolic CHF Patient is noted to be volume overloaded on clinical exam on admission with lower extremity edema, JVD, some crackles on lung exam. Patient with clinical improvement with diuresis. I/o -810. Oral Demadex dose increased per nephrology.   #3 acute on chronic kidney disease stage IV versus progressive chronic kidney disease Likely secondary to problem #2. Patient's chronic kidney disease secondary to diabetes, hypertension. Patient creatinine trending back down with change to oral diuretics. Likely secondary to aggressive diuresis. IV diuretics has been changed to oral Demadex per nephrology. Renal function improving. Monitor renal function. Nephrology following and appreciate input and recommendations.  #4 acidosis Secondary to problem #3. Improvement. Continue bicarbonate tablets. Per nephrology.  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 Continue aspirin, hydralazine,imdur, Toprol-XL, Zocor.  #6 history of seizure disorder Stable. Continue Keppra.  #7 history of CVA Stable. Continue Aggrenox for secondary stroke prevention. Continue risk factor modification.  #8 choking Patient has been assessed by speech therapy and patient recommended to be on a regular diet. No further episodes. Continue current renal diet..  #9 diabetes mellitus CBGs have ranged from 104-239. Continue Levemir 5 units daily. Sliding scale insulin.  #10 moderate dementia Stable. Follow.  #11 prophylaxis Protonix for GI prophylaxis. Lovenox for DVT  prophylaxis.  Code Status: Full Family Communication: Updated patient, no family at bedside. Disposition Plan: Home when medically stable, hopefully tomorrow.   Consultants:  Nephrology: Dr. Hinda Lenis 04/28/2014  Procedures:  Chest x-ray 04/27/2014  2 units packed red blood cells 04/27/2014  Antibiotics:  None  HPI/Subjective: Patient denies any further choking episodes. Patient states shortness of breath has improved. No complaints  Objective: Filed Vitals:   05/01/14 1517  BP: 121/46  Pulse: 71  Temp: 97.4 F (36.3 C)  Resp: 20    Intake/Output Summary (Last 24 hours) at 05/01/14 1748 Last data filed at 05/01/14 1439  Gross per 24 hour  Intake    720 ml  Output      0 ml  Net    720 ml   Filed Weights   04/27/14 1853 04/30/14 0840 05/01/14 0526  Weight: 72.439 kg (159 lb 11.2 oz) 70.761 kg (156 lb) 68.13 kg (150 lb 3.2 oz)    Exam:   General:  NAD  Cardiovascular: RRR.  Respiratory: CTAB  Abdomen: Soft, nontender, nondistended, positive bowel sounds.  Musculoskeletal:  Trace bilateral lower extremity edema.   Data Reviewed: Basic Metabolic Panel:  Recent Labs Lab 04/27/14 1536 04/28/14 0552 04/28/14 0815 04/29/14 0615 04/30/14 0621 05/01/14 0717  NA 142 140  --  138 137 140  K 4.2 4.5  --  4.3 4.4 4.2  CL 108 105  --  105 103 104  CO2 16* 14*  --  17* 18* 21  GLUCOSE 211* 257*  --  142* 168* 129*  BUN 80* 76*  --  88* 95* 87*  CREATININE 3.26* 3.00*  --  3.75* 3.47* 3.02*  CALCIUM 8.7 8.8 8.3* 8.5 8.8 8.9  PHOS  --   --   --  5.0*  --   --    Liver Function Tests:  Recent Labs Lab 04/27/14 1536 04/28/14 0552  AST 32 24  ALT 16 12  ALKPHOS 79 69  BILITOT 0.3 0.7  PROT 8.4* 8.2  ALBUMIN 2.6* 2.6*   No results found for this basename: LIPASE, AMYLASE,  in the last 168 hours No results found for this basename: AMMONIA,  in the last 168 hours CBC:  Recent Labs Lab 04/27/14 1536 04/28/14 0552 04/29/14 0615 04/30/14 0621  05/01/14 0717  WBC 7.3 11.5* 7.8 7.6 4.6  NEUTROABS 5.5  --   --   --   --   HGB 6.8* 8.9* 8.7* 9.1* 8.8*  HCT 22.4* 28.1* 27.1* 29.3* 28.3*  MCV 81.2 80.5 79.5 80.7 80.6  PLT 335 281 295 313 283   Cardiac Enzymes:  Recent Labs Lab 04/27/14 1536  TROPONINI <0.30   BNP (last 3 results)  Recent Labs  02/22/14 0226 03/15/14 1645 04/27/14 1536  PROBNP 38775.0* 37134.0* 51631.0*   CBG:  Recent Labs Lab 05/01/14 0009 05/01/14 0721 05/01/14 1154 05/01/14 1558 05/01/14 1626  GLUCAP 141* 128* 239* 137* 104*    No results found for this or any previous visit (from the past 240 hour(s)).   Studies: No results found.  Scheduled Meds: . antiseptic oral rinse  15 mL Mouth Rinse BID  . aspirin EC  81 mg Oral Daily  . dipyridamole-aspirin  1 capsule Oral BID  . donepezil  10 mg Oral Daily  . enoxaparin (LOVENOX) injection  30 mg Subcutaneous Q24H  . folic acid  1 mg Oral Daily  . hydrALAZINE  75 mg Oral 3 times per day  . insulin aspart  0-5 Units Subcutaneous QHS  . insulin aspart  0-9 Units Subcutaneous TID WC  . insulin aspart  3 Units Subcutaneous TID WC  . insulin detemir  5 Units Subcutaneous Daily  . isosorbide mononitrate  15 mg Oral Daily  . levETIRAcetam  500 mg Oral BID  . metoprolol succinate  100 mg Oral Daily  . mupirocin ointment  1 application Topical BID  . pantoprazole  40 mg Oral Daily  . potassium chloride  20 mEq Oral Daily  . simvastatin  10 mg Oral q1800  . sodium bicarbonate  650 mg Oral TID  . [START ON 05/02/2014] torsemide  50 mg Oral Daily  . [START ON 05/02/2014] Vitamin D (Ergocalciferol)  50,000 Units Oral Q30 days   Continuous Infusions:   Principal Problem:   Anemia Active Problems:   Acute on chronic renal failure   Acute on chronic combined systolic and diastolic congestive heart failure   Hypertension   Diabetes   Iron deficiency anemia   Dementia   Chronic kidney disease (CKD), stage IV (severe)   Choking  episode    Time spent: 35 mins    [name]  [facility] Pager [phone]. If 7PM-7AM, please contact night-coverage at [email or web address], password [facility] 05/01/2014, 5:48 PM  LOS: 4 days

## 2014-05-02 LAB — GLUCOSE, CAPILLARY
GLUCOSE-CAPILLARY: 201 mg/dL — AB (ref 70–99)
GLUCOSE-CAPILLARY: 229 mg/dL — AB (ref 70–99)
Glucose-Capillary: 163 mg/dL — ABNORMAL HIGH (ref 70–99)

## 2014-05-02 LAB — BASIC METABOLIC PANEL
BUN: 77 mg/dL — ABNORMAL HIGH (ref 6–23)
CO2: 24 mEq/L (ref 19–32)
Calcium: 9 mg/dL (ref 8.4–10.5)
Chloride: 106 mEq/L (ref 96–112)
Creatinine, Ser: 2.59 mg/dL — ABNORMAL HIGH (ref 0.50–1.10)
GFR, EST AFRICAN AMERICAN: 20 mL/min — AB (ref >90)
GFR, EST NON AFRICAN AMERICAN: 17 mL/min — AB (ref >90)
GLUCOSE: 188 mg/dL — AB (ref 70–99)
POTASSIUM: 4.1 meq/L (ref 3.7–5.3)
SODIUM: 141 meq/L (ref 137–147)

## 2014-05-02 MED ORDER — CALCITRIOL 0.25 MCG PO CAPS: 0.2500 ug | ORAL_CAPSULE | Freq: Every day | ORAL | Status: AC

## 2014-05-02 MED ORDER — CALCITRIOL 0.25 MCG PO CAPS
0.2500 ug | ORAL_CAPSULE | Freq: Every day | ORAL | Status: DC
  Administered 2014-05-02: 0.25 ug via ORAL
  Filled 2014-05-02: qty 1

## 2014-05-02 MED ORDER — TORSEMIDE 100 MG PO TABS: 50.0000 mg | ORAL_TABLET | Freq: Every day | ORAL | Status: DC

## 2014-05-02 MED ORDER — INSULIN DETEMIR 100 UNIT/ML ~~LOC~~ SOLN: 8.0000 [IU] | Freq: Every day | SUBCUTANEOUS | Status: DC

## 2014-05-02 NOTE — Progress Notes (Signed)
Physical Therapy Treatment Patient Details Name: [name] MRN: [id number] DOB: [birth year] Today's Date: 05/02/2014          PT Comments Patient was able to complete MI bed mobility and therex at EOB, 100' of gait training with RW;min guard without O2 and sats at 96-98% throughout treatment. Patient is somewhat incontinent, without warning and had accident right outside room door.                               Mobility  Bed Mobility Overal bed mobility: Modified Independent                Transfers Overall transfer level: Modified independent Equipment used: Rolling walker (2 wheeled) Transfers: Sit to/from Stand Sit to Stand: Min guard         General transfer comment: VC required for hand placement during sit <-> stand transfers  Ambulation/Gait Ambulation/Gait assistance: Min guard Ambulation Distance (Feet): 100 Feet Assistive device: Rolling walker (2 wheeled) Gait Pattern/deviations: Decreased dorsiflexion - right;Decreased dorsiflexion - left;Decreased step length - left;Decreased step length - right;Step-through pattern   Gait velocity interpretation: Below normal speed for age/gender                                                                           Exercises General Exercises - Lower Extremity Long Arc Quad: AROM;Seated;Both;10 reps Hip Flexion/Marching: AROM;Seated;Both;10 reps Toe Raises: AROM;Seated;Both;10 reps Heel Raises: AROM;Seated;10 reps;Both                 PT Goals (current goals can now be found in the care plan section) Progress towards PT goals: Progressing toward goals     End of Session Equipment Utilized During Treatment: Gait belt Activity Tolerance: Patient tolerated treatment well Patient left: in chair;with call bell/phone within reach;with chair alarm set     Time: 1428-1500 PT Time Calculation (min): 32 min  Charges:  $Gait Training: 8-22  mins $Therapeutic Exercise: 8-22 mins                    G Codes:      Molli Knock 05/02/2014, 3:18 PM

## 2014-05-02 NOTE — Progress Notes (Signed)
Received referral from long length of stay meeting call from the MD Director ( Dr Reynaldo Minium) for [place] [facility] services. Spoke with inpatient RNCM to make aware attempt will be made to reach patient by phone. Made aware that patient's daughter should be spoken to regarding [place] [facility] services. Called into room without answer. Attempted to reach daughter [name] by cell without answer. Will try back again later.  Marthenia Rolling, MSN-RN,BSN- [facility] [phone]

## 2014-05-02 NOTE — Progress Notes (Signed)
Patients O2 sat on room air ambulating 93%. Patient tolerated well.

## 2014-05-02 NOTE — Discharge Summary (Signed)
Physician Discharge Summary  [name] PJA:[id number] DOB: August 25, 1941 DOA: 04/27/2014  PCP: [name], MD  Admit date: 04/27/2014 Discharge date: 05/02/2014  Time spent: 70 minutes  Recommendations for Outpatient Follow-up:  1. Followup with HILL,GERALD K, MD in 1 week. On followup basic metabolic profile needs to be obtained to followup on electrolytes and renal function.  CBC also need to be obtained to followup on patient's hemoglobin. 2. Followup with Dr. Hinda Lenis of nephrology in 3 weeks.  Discharge Diagnoses:  Principal Problem:   Anemia Active Problems:   Acute on chronic renal failure   Acute on chronic combined systolic and diastolic congestive heart failure   Hypertension   Diabetes   Iron deficiency anemia   Dementia   Chronic kidney disease (CKD), stage IV (severe)   Choking episode   Discharge Condition: Stable and improved  Diet recommendation: Heart healthy diet/renal diet  Filed Weights   04/30/14 0840 05/01/14 0526 05/02/14 0541  Weight: 70.761 kg (156 lb) 68.13 kg (150 lb 3.2 oz) 64.9 kg (143 lb 1.3 oz)    History of present illness:  [name] is a 73 y.o. female  This is a 73 year old moderately demented lady who has chronic kidney disease with a baseline creatinine of around 2.2, who sees nephrology, who presents with a choking episode at home approximately 3-4 hours ago followed by dyspnea. According to the daughter, who is at the bedside, she had a choking episode after having a drink of some sort. She denies any fever. The patient herself is unable to give me a clear history due to her dementia. The daughter describes leg swelling as well as dyspnea today. Evaluation in the emergency room showed worsening anemia with a hemoglobin of 6.8. Previously her hemoglobin had been in the 8 range. She is now being admitted for further investigation and management.      Hospital Course:  #1 symptomatic anemia  Likely secondary to chronic kidney  disease. Patient with no overt GI bleed. Patient on admission was noted to have a hemoglobin of 6.8. Patient did not have any overt bleeding. Patient was transfused 2 units of packed red blood cells with appropriate response with hemoglobin , and up to 8.8 by day of discharge. Anemia panel was obtained. Anemia panel c/w iron deficiency anemia/AOCD. Patient was noted to have iron saturation of 5% and a ferritin of 52. Patient was transfused IV iron per nephrology. Patient will followup with PCP and nephrology as outpatient.  #2 probable acute on chronic systolic CHF  Patient is noted to be volume overloaded on clinical exam on admission with lower extremity edema, JVD, some crackles on lung exam. Patient was placed on IV diuretics also followed by nephrology during the hospitalization. Patient diuresed well and was subsequently transitioned from IV Lasix to oral Demadex. Patient's Demadex dose was titrated up she'll be discharged home on 50 mg of Demadex daily. Patient is to followup with nephrology and PCP as outpatient. #3 acute on chronic kidney disease stage IV versus progressive chronic kidney disease  Likely secondary to problem #2. Patient's chronic kidney disease secondary to diabetes, hypertension. Patient creatinine started to trend up during the hospitalization as patient was being diureses aggressively with IV diuretics. Patient was seen by nephrology during the hospitalization. Patient's IV diuretic dose was decreased and subsequently transitioned to oral Demadex. Patient continued to have good urine output and renal function started to trend back down. On day of discharge patient's creatinine was down to 2.59. Patient be  discharged on calcitriol as well as an increased dose of Demadex and 50 mg daily. Patient is to followup with nephrology as outpatient.  #4 acidosis  Secondary to problem #3. Patient was maintained on home regimen of bicarbonate tablets and acidosis had resolved the day of  discharge.   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 Continued on aspirin, hydralazine,imdur, Toprol-XL, Zocor.  #6 history of seizure disorder  Stable. Continued on Keppra.  #7 history of CVA  Stable. Continued on Aggrenox for secondary stroke prevention.  #8 choking  Patient has been assessed by speech therapy and patient recommended to be on a regular diet. No further episodes. Patient was maintained on a renal diet and tolerated it. #9 diabetes mellitus  CBGs have ranged from 104-239. Patient was started on 5 units of Levemir daily, and maintained on sliding scale insulin. Patient was discharged home on 8 units of left knee are daily and is to followup with PCP as outpatient.  #10 moderate dementia  Stable.    Procedures: Chest x-ray 04/27/2014  2 units packed red blood cells 04/27/2014   Consultations: Nephrology: Dr. Hinda Lenis 04/28/2014   Discharge Exam: Filed Vitals:   05/02/14 1506  BP: 125/69  Pulse: 71  Temp: 98.4 F (36.9 C)  Resp: 20    General: NAD Cardiovascular: RRR Respiratory: CTAB  Discharge Instructions You were cared for by a hospitalist during your hospital stay. If you have any questions about your discharge medications or the care you received while you were in the hospital after you are discharged, you can call the unit and asked to speak with the hospitalist on call if the hospitalist that took care of you is not available. Once you are discharged, your primary care physician will handle any further medical issues. Please note that NO REFILLS for any discharge medications will be authorized once you are discharged, as it is imperative that you return to your primary care physician (or establish a relationship with a primary care physician if you do not have one) for your aftercare needs so that they can reassess your need for medications and monitor your lab values.      Discharge Instructions   Diet - low sodium heart healthy     Complete by:  As directed   Renal diet.     Discharge instructions    Complete by:  As directed   Follow up with Dr Hinda Lenis in 3 weeks. Follow up HILL,GERALD K, MD in 1 week.     Increase activity slowly    Complete by:  As directed             Medication List         aspirin EC 81 MG tablet  Take 81 mg by mouth every morning.     calcitRIOL 0.25 MCG capsule  Commonly known as:  ROCALTROL  Take 1 capsule (0.25 mcg total) by mouth daily.     dipyridamole-aspirin 200-25 MG per 12 hr capsule  Commonly known as:  AGGRENOX  Take 1 capsule by mouth 2 (two) times daily.     donepezil 10 MG tablet  Commonly known as:  ARICEPT  Take 10 mg by mouth at bedtime.     folic acid 1 MG tablet  Commonly known as:  FOLVITE  Take 1 mg by mouth every morning.     hydrALAZINE 25 MG tablet  Commonly known as:  APRESOLINE  Take 75 mg by mouth every 8 (eight) hours.  insulin detemir 100 UNIT/ML injection  Commonly known as:  LEVEMIR  Inject 0.08 mLs (8 Units total) into the skin at bedtime.     isosorbide mononitrate 30 MG 24 hr tablet  Commonly known as:  IMDUR  Take 0.5 mg by mouth every morning.     levETIRAcetam 100 MG/ML solution  Commonly known as:  KEPPRA  Take 5 mLs by mouth 2 (two) times daily.     metoprolol succinate 100 MG 24 hr tablet  Commonly known as:  TOPROL-XL  Take 100 mg by mouth every morning. Take with or immediately following a meal.     mupirocin ointment 2 %  Commonly known as:  BACTROBAN  Apply 1 application topically 2 (two) times daily.     pantoprazole 40 MG tablet  Commonly known as:  PROTONIX  Take 40 mg by mouth every morning.     potassium chloride 10 MEQ CR capsule  Commonly known as:  MICRO-K  Take 10-20 mEq by mouth See admin instructions. Alternate taking one Potassium daily with taking one Potassium twice daily with Torsemide as directed     pravastatin 20 MG tablet  Commonly known as:  PRAVACHOL  Take 20 mg by mouth at bedtime.      sodium bicarbonate 650 MG tablet  Take 650 mg by mouth 3 (three) times daily.     torsemide 100 MG tablet  Commonly known as:  DEMADEX  Take 0.5 tablets (50 mg total) by mouth daily.     TRADJENTA 5 MG Tabs tablet  Generic drug:  linagliptin  Take 5 mg by mouth every morning.     Vitamin D (Ergocalciferol) 50000 UNITS Caps capsule  Commonly known as:  DRISDOL  Take 50,000 Units by mouth every 30 (thirty) days. Takes on Tuesday       No Known Allergies Follow-up Information   Follow up with [facility] S, MD In 3 weeks.   Specialty:  Nephrology   Contact information:   [address] [address] [phone]       Follow up with HILL,GERALD K, MD. Schedule an appointment as soon as possible for a visit in 1 week.   Specialty:  Family Medicine   Contact information:   [address] [place] [address] [phone]        The results of significant diagnostics from this hospitalization (including imaging, microbiology, ancillary and laboratory) are listed below for reference.    Significant Diagnostic Studies: Dg Chest 2 View  04/27/2014   CLINICAL DATA:  CHOKING SHORTNESS OF BREATH  EXAM: CHEST  2 VIEW  COMPARISON:  Two-view chest 03/15/2014  FINDINGS: Cardiac silhouette is enlarged. Stable median sternotomy changes. Lungs clear. No acute osseous abnormalities. Mild degenerative changes within the shoulders.  IMPRESSION: No active cardiopulmonary disease.   Electronically Signed   By: Margaree Mackintosh M.D.   On: 04/27/2014 15:16    Microbiology: No results found for this or any previous visit (from the past 240 hour(s)).   Labs: Basic Metabolic Panel:  Recent Labs Lab 04/28/14 0552 04/28/14 0815 04/29/14 0615 04/30/14 3734 05/01/14 0717 05/02/14 0639  NA 140  --  138 137 140 141  K 4.5  --  4.3 4.4 4.2 4.1  CL 105  --  105 103 104 106  CO2 14*  --  17* 18* 21 24  GLUCOSE 257*  --  142* 168* 129* 188*  BUN 76*  --  88* 95*  87* 77*  CREATININE 3.00*  --  3.75* 3.47* 3.02* 2.59*  CALCIUM 8.8 8.3* 8.5 8.8 8.9 9.0  PHOS  --   --  5.0*  --   --   --    Liver Function Tests:  Recent Labs Lab 04/27/14 1536 04/28/14 0552  AST 32 24  ALT 16 12  ALKPHOS 79 69  BILITOT 0.3 0.7  PROT 8.4* 8.2  ALBUMIN 2.6* 2.6*   No results found for this basename: LIPASE, AMYLASE,  in the last 168 hours No results found for this basename: AMMONIA,  in the last 168 hours CBC:  Recent Labs Lab 04/27/14 1536 04/28/14 0552 04/29/14 0615 04/30/14 0621 05/01/14 0717  WBC 7.3 11.5* 7.8 7.6 4.6  NEUTROABS 5.5  --   --   --   --   HGB 6.8* 8.9* 8.7* 9.1* 8.8*  HCT 22.4* 28.1* 27.1* 29.3* 28.3*  MCV 81.2 80.5 79.5 80.7 80.6  PLT 335 281 295 313 283   Cardiac Enzymes:  Recent Labs Lab 04/27/14 1536  TROPONINI <0.30   BNP: BNP (last 3 results)  Recent Labs  02/22/14 0226 03/15/14 1645 04/27/14 1536  PROBNP 38775.0* 37134.0* 51631.0*   CBG:  Recent Labs Lab 05/01/14 1558 05/01/14 1626 05/02/14 0717 05/02/14 1135 05/02/14 1621  GLUCAP 137* 104* 163* 201* 229*       Signed:  [name] [facility] 05/02/2014, 5:22 PM

## 2014-05-02 NOTE — Progress Notes (Signed)
IV removed. Discharge instructions reviewed with patients daughter. Understanding verbalized. Ready for discharge home.

## 2014-05-02 NOTE — Progress Notes (Signed)
Subjective: Interval History:. Denies any difficulty in breathing. Presently no complaint  Objective: Vital signs in last 24 hours: Temp:  [97.4 F (36.3 C)-98 F (36.7 C)] 98 F (36.7 C) (06/09 0541) Pulse Rate:  [71-114] 83 (06/09 0541) Resp:  [20] 20 (06/09 0541) BP: (121-161)/(46-78) 161/78 mmHg (06/09 0541) SpO2:  [96 %-100 %] 97 % (06/09 0541) Weight:  [64.9 kg (143 lb 1.3 oz)] 64.9 kg (143 lb 1.3 oz) (06/09 0541) Weight change: -5.861 kg (-12 lb 14.7 oz)  Intake/Output from previous day: 06/08 0701 - 06/09 0700 In: 720 [P.O.:720] Out: -  Intake/Output this shift:    Generally she is alert and in no apparent distress Chest decrease breath sound bilaterally  Heart regular rate and rhythm no murmur Abdomen positive bowl sound and none tender Extremities Bilateral 1+edema  Lab Results:  Recent Labs  04/30/14 0621 05/01/14 0717  WBC 7.6 4.6  HGB 9.1* 8.8*  HCT 29.3* 28.3*  PLT 313 283   BMET:   Recent Labs  05/01/14 0717 05/02/14 0639  NA 140 141  K 4.2 4.1  CL 104 106  CO2 21 24  GLUCOSE 129* 188*  BUN 87* 77*  CREATININE 3.02* 2.59*  CALCIUM 8.9 9.0   No results found for this basename: PTH,  in the last 72 hours Iron Studies:  No results found for this basename: IRON, TIBC, TRANSFERRIN, FERRITIN,  in the last 72 hours  Studies/Results: No results found.  I have reviewed the patient's current medications.  Assessment/Plan: Problem #1 renal failure: Her BUN is 95 and creatinine 2.59. Her renal function is improving.Marland Kitchen Her renal function is returning to her base line  Problem #2 low CO2: Possibly metabolic. Patient on sodium bicarbonate her CO2 has improved and presently with in acceptable range Problem #3 anemia: s/p iron supplement her hemoglobin is declining Problem #4 hypertension: Her blood pressure reasonably controlled Problem #5 diabetes Problem #6 metabolic bone disease: Calcium in the range but her phosphorus is slightly high Problem  #7 history of CHF: Patient on Demadex . No difficulty in breathing Problem #8 history of CVA Problem#9 History of secondary parathyroid  Plan: continue with Demadex Rocaltrol .25 microgram po once a day Basic metabolic panel in am     LOS: 5 days   Adilynn Bessey S Bobie Caris 05/02/2014,8:13 AM

## 2014-05-11 ENCOUNTER — Ambulatory Visit: Payer: Medicare Other | Admitting: Cardiology

## 2014-05-25 ENCOUNTER — Encounter: Payer: Self-pay | Admitting: Cardiology

## 2014-06-09 ENCOUNTER — Encounter (HOSPITAL_COMMUNITY): Payer: Self-pay | Admitting: Emergency Medicine

## 2014-06-09 ENCOUNTER — Inpatient Hospital Stay (HOSPITAL_COMMUNITY)
Admission: EM | Admit: 2014-06-09 | Discharge: 2014-06-12 | DRG: 811 | Disposition: A | Discharge status: Home / Self Care | Payer: Medicare Other | Attending: Internal Medicine | Admitting: Internal Medicine

## 2014-06-09 ENCOUNTER — Emergency Department (HOSPITAL_COMMUNITY): Payer: Medicare Other

## 2014-06-09 DIAGNOSIS — I252 Old myocardial infarction: Secondary | ICD-10-CM | POA: Diagnosis not present

## 2014-06-09 DIAGNOSIS — I129 Hypertensive chronic kidney disease with stage 1 through stage 4 chronic kidney disease, or unspecified chronic kidney disease: Secondary | ICD-10-CM | POA: Diagnosis present

## 2014-06-09 DIAGNOSIS — I214 Non-ST elevation (NSTEMI) myocardial infarction: Secondary | ICD-10-CM | POA: Diagnosis present

## 2014-06-09 DIAGNOSIS — I5043 Acute on chronic combined systolic (congestive) and diastolic (congestive) heart failure: Secondary | ICD-10-CM | POA: Diagnosis present

## 2014-06-09 DIAGNOSIS — Z7982 Long term (current) use of aspirin: Secondary | ICD-10-CM | POA: Diagnosis not present

## 2014-06-09 DIAGNOSIS — E1121 Type 2 diabetes mellitus with diabetic nephropathy: Secondary | ICD-10-CM

## 2014-06-09 DIAGNOSIS — I2510 Atherosclerotic heart disease of native coronary artery without angina pectoris: Secondary | ICD-10-CM | POA: Diagnosis present

## 2014-06-09 DIAGNOSIS — E119 Type 2 diabetes mellitus without complications: Secondary | ICD-10-CM | POA: Diagnosis present

## 2014-06-09 DIAGNOSIS — N189 Chronic kidney disease, unspecified: Secondary | ICD-10-CM

## 2014-06-09 DIAGNOSIS — F0390 Unspecified dementia without behavioral disturbance: Secondary | ICD-10-CM

## 2014-06-09 DIAGNOSIS — D509 Iron deficiency anemia, unspecified: Principal | ICD-10-CM | POA: Diagnosis present

## 2014-06-09 DIAGNOSIS — I509 Heart failure, unspecified: Secondary | ICD-10-CM | POA: Diagnosis present

## 2014-06-09 DIAGNOSIS — D62 Acute posthemorrhagic anemia: Secondary | ICD-10-CM

## 2014-06-09 DIAGNOSIS — N039 Chronic nephritic syndrome with unspecified morphologic changes: Secondary | ICD-10-CM | POA: Diagnosis present

## 2014-06-09 DIAGNOSIS — Z87891 Personal history of nicotine dependence: Secondary | ICD-10-CM

## 2014-06-09 DIAGNOSIS — I639 Cerebral infarction, unspecified: Secondary | ICD-10-CM | POA: Diagnosis present

## 2014-06-09 DIAGNOSIS — R531 Weakness: Secondary | ICD-10-CM | POA: Diagnosis present

## 2014-06-09 DIAGNOSIS — E782 Mixed hyperlipidemia: Secondary | ICD-10-CM | POA: Diagnosis present

## 2014-06-09 DIAGNOSIS — Z833 Family history of diabetes mellitus: Secondary | ICD-10-CM

## 2014-06-09 DIAGNOSIS — D500 Iron deficiency anemia secondary to blood loss (chronic): Secondary | ICD-10-CM | POA: Diagnosis present

## 2014-06-09 DIAGNOSIS — Z823 Family history of stroke: Secondary | ICD-10-CM | POA: Diagnosis not present

## 2014-06-09 DIAGNOSIS — R7989 Other specified abnormal findings of blood chemistry: Secondary | ICD-10-CM

## 2014-06-09 DIAGNOSIS — I340 Nonrheumatic mitral (valve) insufficiency: Secondary | ICD-10-CM

## 2014-06-09 DIAGNOSIS — Z8673 Personal history of transient ischemic attack (TIA), and cerebral infarction without residual deficits: Secondary | ICD-10-CM | POA: Diagnosis not present

## 2014-06-09 DIAGNOSIS — R778 Other specified abnormalities of plasma proteins: Secondary | ICD-10-CM

## 2014-06-09 DIAGNOSIS — G40909 Epilepsy, unspecified, not intractable, without status epilepticus: Secondary | ICD-10-CM

## 2014-06-09 DIAGNOSIS — N184 Chronic kidney disease, stage 4 (severe): Secondary | ICD-10-CM | POA: Diagnosis present

## 2014-06-09 DIAGNOSIS — N179 Acute kidney failure, unspecified: Secondary | ICD-10-CM

## 2014-06-09 DIAGNOSIS — Z951 Presence of aortocoronary bypass graft: Secondary | ICD-10-CM

## 2014-06-09 DIAGNOSIS — D631 Anemia in chronic kidney disease: Secondary | ICD-10-CM | POA: Diagnosis present

## 2014-06-09 DIAGNOSIS — Z9981 Dependence on supplemental oxygen: Secondary | ICD-10-CM | POA: Diagnosis not present

## 2014-06-09 DIAGNOSIS — I5033 Acute on chronic diastolic (congestive) heart failure: Secondary | ICD-10-CM

## 2014-06-09 DIAGNOSIS — Z794 Long term (current) use of insulin: Secondary | ICD-10-CM

## 2014-06-09 DIAGNOSIS — I2589 Other forms of chronic ischemic heart disease: Secondary | ICD-10-CM | POA: Diagnosis present

## 2014-06-09 DIAGNOSIS — D649 Anemia, unspecified: Secondary | ICD-10-CM

## 2014-06-09 DIAGNOSIS — R5381 Other malaise: Secondary | ICD-10-CM | POA: Diagnosis present

## 2014-06-09 DIAGNOSIS — I059 Rheumatic mitral valve disease, unspecified: Secondary | ICD-10-CM

## 2014-06-09 LAB — CBC WITH DIFFERENTIAL/PLATELET
BASOS PCT: 0 % (ref 0–1)
Basophils Absolute: 0 10*3/uL (ref 0.0–0.1)
EOS ABS: 0 10*3/uL (ref 0.0–0.7)
EOS PCT: 0 % (ref 0–5)
HCT: 18.1 % — ABNORMAL LOW (ref 36.0–46.0)
Hemoglobin: 5.5 g/dL — CL (ref 12.0–15.0)
Lymphocytes Relative: 14 % (ref 12–46)
Lymphs Abs: 0.9 10*3/uL (ref 0.7–4.0)
MCH: 26.8 pg (ref 26.0–34.0)
MCHC: 30.4 g/dL (ref 30.0–36.0)
MCV: 88.3 fL (ref 78.0–100.0)
MONOS PCT: 8 % (ref 3–12)
Monocytes Absolute: 0.5 10*3/uL (ref 0.1–1.0)
Neutro Abs: 4.6 10*3/uL (ref 1.7–7.7)
Neutrophils Relative %: 78 % — ABNORMAL HIGH (ref 43–77)
Platelets: 382 10*3/uL (ref 150–400)
RBC: 2.05 MIL/uL — ABNORMAL LOW (ref 3.87–5.11)
RDW: 19.5 % — ABNORMAL HIGH (ref 11.5–15.5)
WBC: 6 10*3/uL (ref 4.0–10.5)

## 2014-06-09 LAB — URINE MICROSCOPIC-ADD ON

## 2014-06-09 LAB — COMPREHENSIVE METABOLIC PANEL
ALBUMIN: 2.9 g/dL — AB (ref 3.5–5.2)
ALK PHOS: 70 U/L (ref 39–117)
ALT: 8 U/L (ref 0–35)
ANION GAP: 14 (ref 5–15)
AST: 22 U/L (ref 0–37)
BUN: 82 mg/dL — AB (ref 6–23)
CALCIUM: 8.7 mg/dL (ref 8.4–10.5)
CO2: 19 mEq/L (ref 19–32)
Chloride: 106 mEq/L (ref 96–112)
Creatinine, Ser: 2.96 mg/dL — ABNORMAL HIGH (ref 0.50–1.10)
GFR calc non Af Amer: 15 mL/min — ABNORMAL LOW (ref >90)
GFR, EST AFRICAN AMERICAN: 17 mL/min — AB (ref >90)
GLUCOSE: 229 mg/dL — AB (ref 70–99)
POTASSIUM: 4.4 meq/L (ref 3.7–5.3)
Sodium: 139 mEq/L (ref 137–147)
TOTAL PROTEIN: 8.6 g/dL — AB (ref 6.0–8.3)
Total Bilirubin: 0.3 mg/dL (ref 0.3–1.2)

## 2014-06-09 LAB — OCCULT BLOOD, POC DEVICE: Fecal Occult Bld: NEGATIVE

## 2014-06-09 LAB — MRSA PCR SCREENING: MRSA BY PCR: NEGATIVE

## 2014-06-09 LAB — CBC
HCT: 24.7 % — ABNORMAL LOW (ref 36.0–46.0)
Hemoglobin: 8 g/dL — ABNORMAL LOW (ref 12.0–15.0)
MCH: 28.2 pg (ref 26.0–34.0)
MCHC: 32.4 g/dL (ref 30.0–36.0)
MCV: 87 fL (ref 78.0–100.0)
Platelets: 324 10*3/uL (ref 150–400)
RBC: 2.84 MIL/uL — ABNORMAL LOW (ref 3.87–5.11)
RDW: 17.8 % — AB (ref 11.5–15.5)
WBC: 5.7 10*3/uL (ref 4.0–10.5)

## 2014-06-09 LAB — URINALYSIS, ROUTINE W REFLEX MICROSCOPIC
Bilirubin Urine: NEGATIVE
GLUCOSE, UA: NEGATIVE mg/dL
Hgb urine dipstick: NEGATIVE
KETONES UR: NEGATIVE mg/dL
LEUKOCYTES UA: NEGATIVE
Nitrite: NEGATIVE
Protein, ur: 30 mg/dL — AB
Specific Gravity, Urine: 1.015 (ref 1.005–1.030)
Urobilinogen, UA: 0.2 mg/dL (ref 0.0–1.0)
pH: 5.5 (ref 5.0–8.0)

## 2014-06-09 LAB — GLUCOSE, CAPILLARY
GLUCOSE-CAPILLARY: 211 mg/dL — AB (ref 70–99)
Glucose-Capillary: 197 mg/dL — ABNORMAL HIGH (ref 70–99)
Glucose-Capillary: 165 mg/dL — ABNORMAL HIGH (ref 70–99)

## 2014-06-09 LAB — PREPARE RBC (CROSSMATCH)

## 2014-06-09 LAB — PROTIME-INR
INR: 1.19 (ref 0.00–1.49)
Prothrombin Time: 15.1 seconds (ref 11.6–15.2)

## 2014-06-09 LAB — TROPONIN I: Troponin I: 0.48 ng/mL (ref <0.30)

## 2014-06-09 LAB — HEMOGLOBIN A1C
HEMOGLOBIN A1C: 6.7 % — AB (ref <5.7)
Mean Plasma Glucose: 146 mg/dL — ABNORMAL HIGH (ref <117)

## 2014-06-09 LAB — PRO B NATRIURETIC PEPTIDE: Pro B Natriuretic peptide (BNP): 30358 pg/mL — ABNORMAL HIGH (ref 0–125)

## 2014-06-09 MED ORDER — FOLIC ACID 1 MG PO TABS
1.0000 mg | ORAL_TABLET | Freq: Every morning | ORAL | Status: DC
  Administered 2014-06-10 – 2014-06-12 (×3): 1 mg via ORAL
  Filled 2014-06-09 (×3): qty 1

## 2014-06-09 MED ORDER — INSULIN ASPART 100 UNIT/ML ~~LOC~~ SOLN: 0.0000 [IU] | Freq: Every day | SUBCUTANEOUS | Status: DC

## 2014-06-09 MED ORDER — SODIUM BICARBONATE 650 MG PO TABS
650.0000 mg | ORAL_TABLET | Freq: Three times a day (TID) | ORAL | Status: DC
  Administered 2014-06-09 – 2014-06-12 (×9): 650 mg via ORAL
  Filled 2014-06-09 (×9): qty 1

## 2014-06-09 MED ORDER — HYDROCODONE-ACETAMINOPHEN 5-325 MG PO TABS: 1.0000 | ORAL_TABLET | ORAL | Status: DC | PRN

## 2014-06-09 MED ORDER — SIMVASTATIN 10 MG PO TABS
5.0000 mg | ORAL_TABLET | Freq: Every day | ORAL | Status: DC
  Administered 2014-06-09 – 2014-06-11 (×3): 5 mg via ORAL
  Filled 2014-06-09: qty 0.5
  Filled 2014-06-09: qty 1
  Filled 2014-06-09: qty 0.5
  Filled 2014-06-09 (×2): qty 1

## 2014-06-09 MED ORDER — SODIUM CHLORIDE 0.9 % IJ SOLN
3.0000 mL | Freq: Two times a day (BID) | INTRAMUSCULAR | Status: DC
  Administered 2014-06-09 – 2014-06-12 (×6): 3 mL via INTRAVENOUS

## 2014-06-09 MED ORDER — ACETAMINOPHEN 325 MG PO TABS: 650.0000 mg | ORAL_TABLET | Freq: Four times a day (QID) | ORAL | Status: DC | PRN

## 2014-06-09 MED ORDER — LEVETIRACETAM 100 MG/ML PO SOLN
500.0000 mg | Freq: Two times a day (BID) | ORAL | Status: DC
  Administered 2014-06-09 – 2014-06-12 (×6): 500 mg via ORAL
  Filled 2014-06-09 (×11): qty 5

## 2014-06-09 MED ORDER — PANTOPRAZOLE SODIUM 40 MG PO TBEC
40.0000 mg | DELAYED_RELEASE_TABLET | Freq: Every morning | ORAL | Status: DC
  Administered 2014-06-10 – 2014-06-12 (×3): 40 mg via ORAL
  Filled 2014-06-09 (×3): qty 1

## 2014-06-09 MED ORDER — INSULIN ASPART 100 UNIT/ML ~~LOC~~ SOLN
0.0000 [IU] | Freq: Three times a day (TID) | SUBCUTANEOUS | Status: DC
  Administered 2014-06-09 – 2014-06-10 (×2): 3 [IU] via SUBCUTANEOUS
  Administered 2014-06-10: 2 [IU] via SUBCUTANEOUS
  Administered 2014-06-10 – 2014-06-11 (×3): 3 [IU] via SUBCUTANEOUS
  Administered 2014-06-11: 5 [IU] via SUBCUTANEOUS
  Administered 2014-06-12 (×2): 3 [IU] via SUBCUTANEOUS

## 2014-06-09 MED ORDER — ACETAMINOPHEN 650 MG RE SUPP: 650.0000 mg | Freq: Four times a day (QID) | RECTAL | Status: DC | PRN

## 2014-06-09 MED ORDER — FUROSEMIDE 10 MG/ML IJ SOLN
20.0000 mg | Freq: Once | INTRAMUSCULAR | Status: AC
  Administered 2014-06-09: 20 mg via INTRAVENOUS
  Filled 2014-06-09: qty 2

## 2014-06-09 MED ORDER — SODIUM CHLORIDE 0.9 % IV SOLN: 250.0000 mL | INTRAVENOUS | Status: DC | PRN

## 2014-06-09 MED ORDER — CALCITRIOL 0.25 MCG PO CAPS
0.2500 ug | ORAL_CAPSULE | Freq: Every day | ORAL | Status: DC
  Administered 2014-06-10 – 2014-06-12 (×3): 0.25 ug via ORAL
  Filled 2014-06-09 (×3): qty 1

## 2014-06-09 MED ORDER — INSULIN GLARGINE 100 UNIT/ML ~~LOC~~ SOLN
4.0000 [IU] | Freq: Every day | SUBCUTANEOUS | Status: DC
  Filled 2014-06-09 (×3): qty 0.04

## 2014-06-09 MED ORDER — HYDRALAZINE HCL 25 MG PO TABS
75.0000 mg | ORAL_TABLET | Freq: Three times a day (TID) | ORAL | Status: DC
  Administered 2014-06-09 – 2014-06-12 (×10): 75 mg via ORAL
  Filled 2014-06-09 (×10): qty 3

## 2014-06-09 MED ORDER — ISOSORBIDE MONONITRATE ER 30 MG PO TB24: 0.5000 mg | ORAL_TABLET | Freq: Every morning | ORAL | Status: DC

## 2014-06-09 MED ORDER — ALUM & MAG HYDROXIDE-SIMETH 200-200-20 MG/5ML PO SUSP
30.0000 mL | Freq: Four times a day (QID) | ORAL | Status: DC | PRN
  Administered 2014-06-10: 30 mL via ORAL
  Filled 2014-06-09: qty 30

## 2014-06-09 MED ORDER — DONEPEZIL HCL 5 MG PO TABS
10.0000 mg | ORAL_TABLET | Freq: Every day | ORAL | Status: DC
  Administered 2014-06-09 – 2014-06-11 (×3): 10 mg via ORAL
  Filled 2014-06-09 (×3): qty 2

## 2014-06-09 MED ORDER — METOPROLOL SUCCINATE ER 50 MG PO TB24
100.0000 mg | ORAL_TABLET | Freq: Every morning | ORAL | Status: DC
  Administered 2014-06-10 – 2014-06-12 (×3): 100 mg via ORAL
  Filled 2014-06-09 (×3): qty 2

## 2014-06-09 MED ORDER — INSULIN DETEMIR 100 UNIT/ML ~~LOC~~ SOLN
4.0000 [IU] | Freq: Every day | SUBCUTANEOUS | Status: DC
  Administered 2014-06-09 – 2014-06-12 (×4): 4 [IU] via SUBCUTANEOUS
  Filled 2014-06-09 (×5): qty 0.04

## 2014-06-09 MED ORDER — ISOSORBIDE MONONITRATE ER 30 MG PO TB24
15.0000 mg | ORAL_TABLET | Freq: Every day | ORAL | Status: DC
  Administered 2014-06-10 – 2014-06-12 (×3): 15 mg via ORAL
  Filled 2014-06-09 (×3): qty 1

## 2014-06-09 MED ORDER — ONDANSETRON HCL 4 MG PO TABS: 4.0000 mg | ORAL_TABLET | Freq: Four times a day (QID) | ORAL | Status: DC | PRN

## 2014-06-09 MED ORDER — SODIUM CHLORIDE 0.9 % IJ SOLN: 3.0000 mL | INTRAMUSCULAR | Status: DC | PRN

## 2014-06-09 MED ORDER — ONDANSETRON HCL 4 MG/2ML IJ SOLN: 4.0000 mg | Freq: Four times a day (QID) | INTRAMUSCULAR | Status: DC | PRN

## 2014-06-09 NOTE — ED Notes (Signed)
CRITICAL VALUE ALERT  Critical value received:  Troponin  Date of notification:  06/09/14  Time of notification:  3220  Critical value read back:Yes.    Nurse who received alert:  Jeanice Lim, RN  MD notified (1st page):  [place]  Time of first page:  [phone]

## 2014-06-09 NOTE — ED Provider Notes (Signed)
CSN: [id number]     Arrival date & time 06/09/14  1093 History  This chart was scribed for Ezequiel Essex, MD by Starleen Arms, ED Scribe. This patient was seen in room APA18/APA18 and the patient's care was started at 10:03 AM.    Chief Complaint  Patient presents with  . Abnormal Lab    The history is provided by the patient and a relative. No language interpreter was used.    HPI Comments: [name] is a 73 y.o. female who presents to the Emergency Department complaining of low hemoglobin levels from a blood test yesterday. Per daughter, patient was called by PCP who advised her to be evaluated in the ED. Per daughter, the patient has been generally weaker over the last couple of days.  Patient states that she tolerating food and fluids well.  Patient is not on dialysis. Patient takes a daily ASA and began using at home oxygen yesterday.  She denies recent falls. Patient denies dizziness, lightheadedness, chest pain, abdominal pain, constipation, urinary frequency, dysuria, hematochezia.  The patient has had one prior blood transfusion approximately 1 month ago.  The patient has a history of CHF, DM, stage IV chronic kidney disease, CABG.   [facility] Past Medical History  Diagnosis Date  . Type 2 diabetes mellitus   . Essential hypertension, benign   . History of stroke     Previously on Coumadin  . MI, old     Reported 66  . Seizure disorder   . Coronary atherosclerosis of native coronary artery     Multivessel status post CABG 2002  . History of GI bleed     Erosive gastritis and duodenitis by EGD 2002  . Mixed hyperlipidemia   . Ischemic cardiomyopathy     LVEF 40-45% March 2013  . Dementia   . CKD (chronic kidney disease) stage 4, GFR 15-29 ml/min   . Stroke   . Seizures    Past Surgical History  Procedure Laterality Date  . Coronary artery bypass graft  July 2002    LIMA to LAD, SVG to OM1, SVG to OM 2, SVG to RCA  . Tee without cardioversion  01/28/2012     Procedure: TRANSESOPHAGEAL ECHOCARDIOGRAM (TEE);  Surgeon: Lelon Perla, MD;  Location: [facility] ENDOSCOPY;  Service: Cardiovascular;  Laterality: [name];  . Colonoscopy  2002  . Esophagogastroduodenoscopy  2002  . Esophagogastroduodenoscopy [name] 01/18/2014    ATF:TDDUK hiatal hernia. Abnormal gastric and duodenal bulbar mucosa of uncertain significance - status post gastric bx (chronic gastritis/H.pylori +   Family History  Problem Relation Age of Onset  . Stroke Father   . Diabetes type II Mother   . Colon cancer Neg Hx    History  Substance Use Topics  . Smoking status: Former Smoker -- 1.00 packs/day for 50 years    Types: Cigarettes  . Smokeless tobacco: Not on file  . Alcohol Use: No   OB History   Grav Para Term Preterm Abortions TAB SAB Ect Mult Living                 Review of Systems A complete 10 system review of systems was obtained and all systems are negative except as noted in the HPI and PMH.      Allergies  Review of patient's allergies indicates no known allergies.  Home Medications   Prior to Admission medications   Medication Sig Start Date End Date Taking? Authorizing Provider  aspirin EC 81 MG tablet Take  81 mg by mouth every morning.     Historical Provider, MD  calcitRIOL (ROCALTROL) 0.25 MCG capsule Take 1 capsule (0.25 mcg total) by mouth daily. 05/02/14   Eugenie Filler, MD  dipyridamole-aspirin (AGGRENOX) 200-25 MG per 12 hr capsule Take 1 capsule by mouth 2 (two) times daily.    Historical Provider, MD  donepezil (ARICEPT) 10 MG tablet Take 10 mg by mouth at bedtime.  01/30/14   Historical Provider, MD  folic acid (FOLVITE) 1 MG tablet Take 1 mg by mouth every morning.     Historical Provider, MD  hydrALAZINE (APRESOLINE) 25 MG tablet Take 75 mg by mouth every 8 (eight) hours.    Historical Provider, MD  insulin detemir (LEVEMIR) 100 UNIT/ML injection Inject 0.08 mLs (8 Units total) into the skin at bedtime. 05/02/14   Eugenie Filler, MD  isosorbide  mononitrate (IMDUR) 30 MG 24 hr tablet Take 0.5 mg by mouth every morning.    Historical Provider, MD  levETIRAcetam (KEPPRA) 100 MG/ML solution Take 5 mLs by mouth 2 (two) times daily. 01/13/14   Historical Provider, MD  linagliptin (TRADJENTA) 5 MG TABS tablet Take 5 mg by mouth every morning.     Historical Provider, MD  metoprolol succinate (TOPROL-XL) 100 MG 24 hr tablet Take 100 mg by mouth every morning. Take with or immediately following a meal.    Historical Provider, MD  mupirocin ointment (BACTROBAN) 2 % Apply 1 application topically 2 (two) times daily. 03/13/14   Historical Provider, MD  pantoprazole (PROTONIX) 40 MG tablet Take 40 mg by mouth every morning.    Historical Provider, MD  potassium chloride (MICRO-K) 10 MEQ CR capsule Take 10-20 mEq by mouth See admin instructions. Alternate taking one Potassium daily with taking one Potassium twice daily with Torsemide as directed    Historical Provider, MD  pravastatin (PRAVACHOL) 20 MG tablet Take 20 mg by mouth at bedtime.     Historical Provider, MD  sodium bicarbonate 650 MG tablet Take 650 mg by mouth 3 (three) times daily.    Historical Provider, MD  torsemide (DEMADEX) 100 MG tablet Take 0.5 tablets (50 mg total) by mouth daily. 05/02/14   Eugenie Filler, MD  Vitamin D, Ergocalciferol, (DRISDOL) 50000 UNITS CAPS Take 50,000 Units by mouth every 30 (thirty) days. Takes on Tuesday    Historical Provider, MD   BP 156/79  Pulse 76  Temp(Src) 98.2 F (36.8 C) (Oral)  Resp 24  Ht 5\' 1"  (1.549 m)  Wt 137 lb 12.6 oz (62.5 kg)  BMI 26.05 kg/m2  SpO2 100% Physical Exam  Nursing note and vitals reviewed. Constitutional: She is oriented to person, place, and time. She appears well-developed and well-nourished. No distress.  HENT:  Head: Normocephalic and atraumatic.  Mouth/Throat: Oropharynx is clear and moist. Mucous membranes are dry. No oropharyngeal exudate.  Eyes: EOM are normal. Pupils are equal, round, and reactive to light.   Pale conjunctiva.   Neck: Normal range of motion. Neck supple.  No meningismus.  Cardiovascular: Normal rate, regular rhythm and intact distal pulses.   Murmur heard. Pulmonary/Chest: Effort normal and breath sounds normal. No respiratory distress.  Abdominal: Soft. There is no tenderness. There is no rebound and no guarding.  Soft  Genitourinary:  Rectal exam shows no hemorrhoids and no gross blood  Musculoskeletal: Normal range of motion. She exhibits no edema and no tenderness.  Neurological: She is alert and oriented to person, place, and time. No cranial nerve deficit. She exhibits  normal muscle tone. Coordination normal.  Slurred speech at baseline. No ataxia on finger to nose bilaterally. No pronator drift. 5/5 strength throughout. CN 2-12 intact. Negative Romberg. Equal grip strength. Sensation intact. Gait is normal.   Skin: Skin is warm.  Psychiatric: She has a normal mood and affect. Her behavior is normal.    ED Course  Procedures (including critical care time)\  DIAGNOSTIC STUDIES: Oxygen Saturation is 100% on RA, normal by my interpretation.    COORDINATION OF CARE:  10:12 AM Will order labs and imaging.  Discussed possibility of ordering a blood transfusion if labs show low hemoglobin levels.  Patient acknowledges and agrees with plan.   10:57 AM Upon recheck, the patient reports no pain.   Labs Review Labs Reviewed  CBC WITH DIFFERENTIAL - Abnormal; Notable for the following:    RBC 2.05 (*)    Hemoglobin 5.5 (*)    HCT 18.1 (*)    RDW 19.5 (*)    Neutrophils Relative % 78 (*)    All other components within normal limits  COMPREHENSIVE METABOLIC PANEL - Abnormal; Notable for the following:    Glucose, Bld 229 (*)    BUN 82 (*)    Creatinine, Ser 2.96 (*)    Total Protein 8.6 (*)    Albumin 2.9 (*)    GFR calc non Af Amer 15 (*)    GFR calc Af Amer 17 (*)    All other components within normal limits  URINALYSIS, ROUTINE W REFLEX MICROSCOPIC - Abnormal;  Notable for the following:    Protein, ur 30 (*)    All other components within normal limits  TROPONIN I - Abnormal; Notable for the following:    Troponin I 0.48 (*)    All other components within normal limits  PRO B NATRIURETIC PEPTIDE - Abnormal; Notable for the following:    Pro B Natriuretic peptide (BNP) 30358.0 (*)    All other components within normal limits  URINE MICROSCOPIC-ADD ON - Abnormal; Notable for the following:    Squamous Epithelial / LPF FEW (*)    Casts HYALINE CASTS (*)    All other components within normal limits  GLUCOSE, CAPILLARY - Abnormal; Notable for the following:    Glucose-Capillary 197 (*)    All other components within normal limits  MRSA PCR SCREENING  PROTIME-INR  CBC  HEMOGLOBIN D3O  BASIC METABOLIC PANEL  CBC  OCCULT BLOOD, POC DEVICE  TYPE AND SCREEN  PREPARE RBC (CROSSMATCH)    Imaging Review Dg Chest Portable 1 View  06/09/2014   CLINICAL DATA:  Abnormal labs, history hypertension, diabetes, stroke, coronary artery disease post MI  EXAM: PORTABLE CHEST - 1 VIEW  COMPARISON:  Portable exam 1028 hr compared to 04/27/2014  FINDINGS: Enlargement of cardiac silhouette post CABG.  Slight pulmonary vascular congestion.  Mediastinal contours normal.  No acute failure or consolidation.  No pleural effusion or pneumothorax.  Bones unremarkable.  IMPRESSION: Enlargement of cardiac silhouette with mild pulmonary vascular congestion post CABG.  No acute abnormalities.   Electronically Signed   By: Lavonia Dana M.D.   On: 06/09/2014 10:41     EKG Interpretation   Date/Time:  Friday June 09 2014 10:18:58 EDT Ventricular Rate:  79 PR Interval:  220 QRS Duration: 95 QT Interval:  423 QTC Calculation: 485 R Axis:   15 Text Interpretation:  Sinus rhythm Borderline prolonged PR interval  Borderline low voltage, extremity leads Probable LVH with secondary repol  abnrm Abnormal T, probable  ischemia, lateral leads Baseline wander in  lead(s) V6 more  pronounced lateral ST depressions with T wave inversions  Confirmed by Wyvonnia Dusky  MD, Avalene Sealy [phone]) on 06/09/2014 10:35:52 AM      MDM   Final diagnoses:  Anemia, unspecified  Acute on chronic diastolic congestive heart failure  Elevated troponin   Patient from PCPs office with anemia. Patient denies complaint. No chest pain, shortness of breath, dizziness or lightheadedness. No blood in stool.  Abdomen soft. Hemoccult negative. Record review shows history of anemia in the past that she relates to chronic kidney disease. She had EGD in 2015 which showed gastritis. Cr near baseline.  Worsening T wave inversions and ST depressions on EKG.  No chest pain.  Troponin 0.48. Patient denies chest pain. Suspect due to anemia. D/w Dr. Harl Bowie  He agrees probably demand ischemia from anemia.  He will evaluate.  Does not feel patient needs to go to [facility].  Blood tranfusion ordered.  Lasix between units as patient already with some SOB with conversation and congestion on CXR. D/w Dr. Caryn Section.   CRITICAL CARE Performed by: Ezequiel Essex Total critical care time: 30 Critical care time was exclusive of separately billable procedures and treating other patients. Critical care was necessary to treat or prevent imminent or life-threatening deterioration. Critical care was time spent personally by me on the following activities: development of treatment plan with patient and/or surrogate as well as nursing, discussions with consultants, evaluation of patient's response to treatment, examination of patient, obtaining history from patient or surrogate, ordering and performing treatments and interventions, ordering and review of laboratory studies, ordering and review of radiographic studies, pulse oximetry and re-evaluation of patient's condition.   I personally performed the services described in this documentation, which was scribed in my presence. The recorded information has been reviewed and is  accurate.   Ezequiel Essex, MD 06/09/14 [phone]

## 2014-06-09 NOTE — Consult Note (Signed)
Primary cardiologist: Consulting cardiologist:  Clinical Summary [name] is a 73 y.o.female hx of chronic systolic heart failure LVEF 40-45% by echo 06/8890, grade I diastolic dysfunction, severe MR poor surgical candidate due to medical comorbidities and dementia, CAD with prior CABG in 2002, prior admission 12/2013 with anemia and milt troponin elevation thought to be demand ischemia, admitted today with recurrent anemia and mild troponin elevation.   Denies any chest pain,no SOB, orthopnea, PND, or LE edema.   Pro-BNP 30,358, Hgb 5.5, Plt 382, K 4.4, Cr 2.96, GFR 15, INR 1.19, trop 0.48 CXR cardiomegaly, mild pulm congestion. EKG SR, ST depressions lateral precordial leads  No Known Allergies  Medications Scheduled Medications: . furosemide  20 mg Intravenous Once     Infusions:     PRN Medications:     Past Medical History  Diagnosis Date  . Type 2 diabetes mellitus   . Essential hypertension, benign   . History of stroke     Previously on Coumadin  . MI, old     Reported 73  . Seizure disorder   . Coronary atherosclerosis of native coronary artery     Multivessel status post CABG 2002  . History of GI bleed     Erosive gastritis and duodenitis by EGD 2002  . Mixed hyperlipidemia   . Ischemic cardiomyopathy     LVEF 40-45% March 2013  . Dementia   . CKD (chronic kidney disease) stage 4, GFR 15-29 ml/min   . Stroke   . Seizures     Past Surgical History  Procedure Laterality Date  . Coronary artery bypass graft  July 2002    LIMA to LAD, SVG to OM1, SVG to OM 2, SVG to RCA  . Tee without cardioversion  01/28/2012    Procedure: TRANSESOPHAGEAL ECHOCARDIOGRAM (TEE);  Surgeon: Lelon Perla, MD;  Location: [facility] ENDOSCOPY;  Service: Cardiovascular;  Laterality: [name];  . Colonoscopy  2002  . Esophagogastroduodenoscopy  2002  . Esophagogastroduodenoscopy [name] 01/18/2014    QXI:HWTUU hiatal hernia. Abnormal gastric and duodenal bulbar mucosa of uncertain  significance - status post gastric bx (chronic gastritis/H.pylori +    Family History  Problem Relation Age of Onset  . Stroke Father   . Diabetes type II Mother   . Colon cancer Neg Hx     Social History [name] reports that she has quit smoking. Her smoking use included Cigarettes. She has a 50 pack-year smoking history. She does not have any smokeless tobacco history on file. [name] reports that she does not drink alcohol.  Review of Systems CONSTITUTIONAL: No weight loss, fever, chills, weakness or fatigue.  HEENT: Eyes: No visual loss, blurred vision, double vision or yellow sclerae. No hearing loss, sneezing, congestion, runny nose or sore throat.  SKIN: No rash or itching.  CARDIOVASCULAR: No chest pain, chest pressure or chest discomfort. No palpitations or edema.  RESPIRATORY: No shortness of breath, cough or sputum.  GASTROINTESTINAL: No anorexia, nausea, vomiting or diarrhea. No abdominal pain or blood.  GENITOURINARY: no polyuria, no dysuria NEUROLOGICAL: No headache, dizziness, syncope, paralysis, ataxia, numbness or tingling in the extremities. No change in bowel or bladder control.  MUSCULOSKELETAL: No muscle, back pain, joint pain or stiffness.  HEMATOLOGIC: No anemia, bleeding or bruising.  LYMPHATICS: No enlarged nodes. No history of splenectomy.  PSYCHIATRIC: No history of depression or anxiety.      Physical Examination Blood pressure 124/72, pulse 71, temperature 98.1 F (36.7 C), temperature source Oral, height  5\' 4"  (1.626 m), weight 135 lb (61.236 kg), SpO2 98.00%. No intake or output data in the 24 hours ending 06/09/14 1148  HEENT: clear clear  Cardiovascular: RRR, 2/6 systolic murmur at apex, no JVD  Respiratory: CTAB anteriorally  GI: abdomen soft, NT, ND  MSK: LEs are warm, no edema  Neuro: no focal deficits  Psych: appropriate affect   Lab Results  Basic Metabolic Panel:  Recent Labs Lab 06/09/14 1021  NA 139  K 4.4  CL  106  CO2 19  GLUCOSE 229*  BUN 82*  CREATININE 2.96*  CALCIUM 8.7    Liver Function Tests:  Recent Labs Lab 06/09/14 1021  AST 22  ALT 8  ALKPHOS 70  BILITOT 0.3  PROT 8.6*  ALBUMIN 2.9*    CBC:  Recent Labs Lab 06/09/14 1021  WBC 6.0  NEUTROABS 4.6  HGB 5.5*  HCT 18.1*  MCV 88.3  PLT 382    Cardiac Enzymes:  Recent Labs Lab 06/09/14 1021  TROPONINI 0.48*    BNP: No components found with this basename: POCBNP,    Imaging 12/2013 Echo Study Conclusions  - Left ventricle: The cavity size was normal. Wall thickness was increased in a pattern of moderate LVH. Systolic function was mildly to moderately reduced. The estimated ejection fraction was 40%. Doppler parameters are consistent with abnormal left ventricular relaxation (grade 1 diastolic dysfunction). 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 - Aortic valve: Mildly calcified annulus. Mildly thickened leaflets. - Mitral valve: There is mildly restricted mobility of the posterior leaflet due to scarring of the basal inferolateral wall. Moderately thickened anterior and mildly thickened posterior leaflets. Severe posteriorly directed regurgitation. - Left atrium: The atrium was mildly dilated. - Right ventricle: The cavity size was mildly to moderately dilated. Systolic function was moderately reduced. - Right atrium: The atrium was mildly dilated. - Pulmonary arteries: Incomplete spectral Doppler profile to accurately assess pulmonary pressures.    Impression/Recommendations 1. NSTEMI - mild troponin elevation in the setting of severe anemia. She has no cardiac symptoms - she likely has some underlying chronic obstructive  CAD, and combined with severe anemia and decreased oxygen carrying capacity she developed some demand ischemia. Do not suspect acute occlusive disease at this time - transfusion per primary team, avoid antiplatelet agents in setting of possible bleed.  - no indication or plans for invasive testing at this time for multiple reasons including likely etiology is demand ischemia, her advanced age, medical comorbidities and renal dysfunction, and recurrent anemia limiting consideration for DAPT if neccesary.   2. Chronic systolic heart failure - denies signiciant symptoms. Mild edema on CXR with elevated pro-BNP - careful monitoring of fluid status with transfusions, recommend IV lasix at least once in between units.  3. Severe MR - poor surgical candidate, continue conservative management    Carlyle Dolly, M.D., F.A.C.C.

## 2014-06-09 NOTE — H&P (Signed)
The patient was seen and examined. Her chart, vitals signs, and laboratory studies were reviewed. She was discussed with nurse practitioner, Ms. Renard Hamper. Agree with her assessment and plan with additions below. The patient was noted to have a hemoglobin of 5.5 on admission. Her Hemoccult in the ED was actually negative. She has a history of a GI bleed requiring 3 units of packed red blood cells in February 2015, but at the time of the evaluation, the  revealed H. pylori gastritis. A colonoscopy was planned in the outpatient setting, but I do not believe it was actually done. We will consult gastroenterology. Will hold Aggrenox as stated and continue PPI.  Will add an additional dose of IV Lasix following the second unit of packed red blood cells. We'll order another 2-D echocardiogram to assess her LV and RV function as her last echo results were recorded in March 2013. As Dr. branch noted, the patient is a poor candidate for invasive intervention secondary to her comorbid conditions including dementia. Will continue to monitor her hemoglobin and hematocrit. The goal is to keep her hemoglobin at 8 g or above, but we'll transfuse cautiously tomorrow if her hemoglobin is still below 8 g this evening. We'll order a TSH and hemoglobin A1c for further evaluation.

## 2014-06-09 NOTE — Progress Notes (Signed)
Pt came to unit from ED. Pt is in no acute distress, receiving her first unit of blood, and VS are stable at this time. Will continue to monitor.

## 2014-06-09 NOTE — ED Notes (Signed)
Dr. Berneta S[place]'s office stated that patient had labs drawn yesterday, resulting in Hgb of 5.5

## 2014-06-09 NOTE — ED Notes (Signed)
Pt was prescribed O2 at 2L yesterday

## 2014-06-09 NOTE — ED Notes (Signed)
CRITICAL VALUE ALERT  Critical value received: Hgb 5.5  Date of notification:  06/09/14  Time of notification: 1100  Critical value read back: yes Nurse who received alert:  Randell Loop  MD notified (1st page):  Dr. Wyvonnia Dusky  Time of first page:  1102  MD notified (2nd page):  Time of second page:  Responding MD:  Dr. Wyvonnia Dusky  Time MD responded:  1102

## 2014-06-09 NOTE — ED Notes (Signed)
Report given to  Sarah, RN.

## 2014-06-09 NOTE — H&P (Signed)
[facility] History and Physical  [name] WUJ:[id number] DOB: 1941-03-29 DOA: 06/09/2014  Referring physician:  PCP: [name], MD   Chief Complaint: abnormal lab  HPI: [name] is a 73 y.o. female with past medical history that includes moderate dementia, chronic kidney disease, CVA, diabetes, , hypertension, chronic systolic heart failure and diastolic dysfunction presents to the emergency department with the chief complaint of abnormal lab. Initial evaluation reveals hemoglobin of 5.5 and elevated troponin.   Information is obtained from the daughter as information from patient somewhat unreliable do to dementia. Patient hospitalized last month with anemia, acute on chronic systolic heart failure and acute on chronic kidney disease went to her PCP yesterday for followup visit. Lab work was drawn and patient was notified of low hemoglobin in about second to the emergency department. In addition the patient was placed on home oxygen yesterday. Daughter reports that patient's over the last couple of days has developed gradual worsening generalized weakness and increased shortness of breath. She denies lower extremity edema chest pain palpitations orthopnea. She denies dizziness lightheadedness syncope or near-syncope. There is no report of abdominal pain nausea vomiting constipation or diarrhea.  Workup in the emergency department significant for a hemoglobin of 5.5 and troponin of 0.48, creatinine of 2.96 which is very close to her baseline according to chart, serum glucose of 229, BNP 30,358, chest x-ray with enlargement of cardiac silhouette with mild pulmonary vascular congestion post CABG. EKG with sinus rhythm probable LVH, abnormal T. likely ischemia  In the emergency department  20 mg of Lasix intravenously ordered but not given as blood had started,  and the first of 2 units of packed RBCs infusing at the time of my exam. She is hemodynamically stable alert and not  hypoxic.  Review of Systems:  10 point review of systems completed and all systems are negative except as indicated in the history of present illness. Of note information obtained from family members as information from patient somewhat unreliable do to dementia.   Past Medical History  Diagnosis Date  . Type 2 diabetes mellitus   . Essential hypertension, benign   . History of stroke     Previously on Coumadin  . MI, old     Reported 39  . Seizure disorder   . Coronary atherosclerosis of native coronary artery     Multivessel status post CABG 2002  . History of GI bleed     Erosive gastritis and duodenitis by EGD 2002  . Mixed hyperlipidemia   . Ischemic cardiomyopathy     LVEF 40-45% March 2013  . Dementia   . CKD (chronic kidney disease) stage 4, GFR 15-29 ml/min   . Stroke   . Seizures    Past Surgical History  Procedure Laterality Date  . Coronary artery bypass graft  July 2002    LIMA to LAD, SVG to OM1, SVG to OM 2, SVG to RCA  . Tee without cardioversion  01/28/2012    Procedure: TRANSESOPHAGEAL ECHOCARDIOGRAM (TEE);  Surgeon: Lelon Perla, MD;  Location: [facility];  Service: Cardiovascular;  Laterality: [name];  . Colonoscopy  2002  . Esophagogastroduodenoscopy  2002  . Esophagogastroduodenoscopy [name] 01/18/2014    NFA:OZHYQ hiatal hernia. Abnormal gastric and duodenal bulbar mucosa of uncertain significance - status post gastric bx (chronic gastritis/H.pylori +   Social History:  reports that she has quit smoking. Her smoking use included Cigarettes. She has a 50 pack-year smoking history. She does not have any smokeless  tobacco history on file. She reports that she does not drink alcohol or use illicit drugs.  No Known Allergies  Family History  Problem Relation Age of Onset  . Stroke Father   . Diabetes type II Mother   . Colon cancer Neg Hx      Prior to Admission medications   Medication Sig Start Date End Date Taking? Authorizing Provider  aspirin EC  81 MG tablet Take 81 mg by mouth every morning.     Historical Provider, MD  calcitRIOL (ROCALTROL) 0.25 MCG capsule Take 1 capsule (0.25 mcg total) by mouth daily. 05/02/14   Eugenie Filler, MD  dipyridamole-aspirin (AGGRENOX) 200-25 MG per 12 hr capsule Take 1 capsule by mouth 2 (two) times daily.    Historical Provider, MD  donepezil (ARICEPT) 10 MG tablet Take 10 mg by mouth at bedtime.  01/30/14   Historical Provider, MD  folic acid (FOLVITE) 1 MG tablet Take 1 mg by mouth every morning.     Historical Provider, MD  hydrALAZINE (APRESOLINE) 25 MG tablet Take 75 mg by mouth every 8 (eight) hours.    Historical Provider, MD  insulin detemir (LEVEMIR) 100 UNIT/ML injection Inject 0.08 mLs (8 Units total) into the skin at bedtime. 05/02/14   Eugenie Filler, MD  isosorbide mononitrate (IMDUR) 30 MG 24 hr tablet Take 0.5 mg by mouth every morning.    Historical Provider, MD  levETIRAcetam (KEPPRA) 100 MG/ML solution Take 5 mLs by mouth 2 (two) times daily. 01/13/14   Historical Provider, MD  linagliptin (TRADJENTA) 5 MG TABS tablet Take 5 mg by mouth every morning.     Historical Provider, MD  metoprolol succinate (TOPROL-XL) 100 MG 24 hr tablet Take 100 mg by mouth every morning. Take with or immediately following a meal.    Historical Provider, MD  mupirocin ointment (BACTROBAN) 2 % Apply 1 application topically 2 (two) times daily. 03/13/14   Historical Provider, MD  pantoprazole (PROTONIX) 40 MG tablet Take 40 mg by mouth every morning.    Historical Provider, MD  potassium chloride (MICRO-K) 10 MEQ CR capsule Take 10-20 mEq by mouth See admin instructions. Alternate taking one Potassium daily with taking one Potassium twice daily with Torsemide as directed    Historical Provider, MD  pravastatin (PRAVACHOL) 20 MG tablet Take 20 mg by mouth at bedtime.     Historical Provider, MD  sodium bicarbonate 650 MG tablet Take 650 mg by mouth 3 (three) times daily.    Historical Provider, MD  torsemide  (DEMADEX) 100 MG tablet Take 0.5 tablets (50 mg total) by mouth daily. 05/02/14   Eugenie Filler, MD  Vitamin D, Ergocalciferol, (DRISDOL) 50000 UNITS CAPS Take 50,000 Units by mouth every 30 (thirty) days. Takes on Tuesday    Historical Provider, MD   Physical Exam: Filed Vitals:   06/09/14 1157  BP: 135/78  Pulse: 73  Temp: 98.1 F (36.7 C)  Resp: 22    BP 135/78  Pulse 73  Temp(Src) 98.1 F (36.7 C) (Oral)  Resp 22  Ht 5\' 4"  (1.626 m)  Wt 61.236 kg (135 lb)  BMI 23.16 kg/m2  SpO2 100%  General:  Appears calm and comfortable Eyes: PERRL, normal lids, irises & conjunctiva ENT: Ears are clear nose without drainage oropharynx without erythema or exudate. Mucous membranes of her mouth are slightly pale but moist. Very poor dentition Neck: no LAD, masses or thyromegaly Cardiovascular: S1 and S2, regular rate and rhythm, +murmur, no lower extremity edema  Respiratory: Increased work of breathing with conversation. Sounds with good air flow albeit somewhat diminished. I hear no rhonchi no wheezes no crackles Abdomen: soft, ntnd positive bowel sounds throughout no guarding Skin: no rash or induration seen on limited exam Musculoskeletal: Only fair muscle tone. Joints without swelling/erythema moves all extremities Psychiatric: grossly normal mood and affect, speech fluent and appropriate Neurologic: Speech is very slow and somewhat slurred this is her baseline. She answers questions appropriately can follow commands           Labs on Admission:  Basic Metabolic Panel:  Recent Labs Lab 06/09/14 1021  NA 139  K 4.4  CL 106  CO2 19  GLUCOSE 229*  BUN 82*  CREATININE 2.96*  CALCIUM 8.7   Liver Function Tests:  Recent Labs Lab 06/09/14 1021  AST 22  ALT 8  ALKPHOS 70  BILITOT 0.3  PROT 8.6*  ALBUMIN 2.9*   No results found for this basename: LIPASE, AMYLASE,  in the last 168 hours No results found for this basename: AMMONIA,  in the last 168  hours CBC:  Recent Labs Lab 06/09/14 1021  WBC 6.0  NEUTROABS 4.6  HGB 5.5*  HCT 18.1*  MCV 88.3  PLT 382   Cardiac Enzymes:  Recent Labs Lab 06/09/14 1021  TROPONINI 0.48*    BNP (last 3 results)  Recent Labs  03/15/14 1645 04/27/14 1536 06/09/14 1021  PROBNP 37134.0* 51631.0* 30358.0*   CBG: No results found for this basename: GLUCAP,  in the last 168 hours  Radiological Exams on Admission: Dg Chest Portable 1 View  06/09/2014   CLINICAL DATA:  Abnormal labs, history hypertension, diabetes, stroke, coronary artery disease post MI  EXAM: PORTABLE CHEST - 1 VIEW  COMPARISON:  Portable exam 1028 hr compared to 04/27/2014  FINDINGS: Enlargement of cardiac silhouette post CABG.  Slight pulmonary vascular congestion.  Mediastinal contours normal.  No acute failure or consolidation.  No pleural effusion or pneumothorax.  Bones unremarkable.  IMPRESSION: Enlargement of cardiac silhouette with mild pulmonary vascular congestion post CABG.  No acute abnormalities.   Electronically Signed   By: Lavonia Dana M.D.   On: 06/09/2014 10:41    EKG: Independently reviewed. Sinus rhythm with likely LVH. T-wave abnormality possible ischemia  Assessment/Plan Principal Problem:   Anemia: Symptomatic. Will admit.  continue transfusing 2 units of packed RBCs. Will recheck her hemoglobin 6 PM this evening. Obtain FOBT. Chart review indicates she was in the hospital month ago for this same. At that time patient with no overt GI bleed and anemia presumed secondary to chronic kidney disease as well as iron deficiency per anemia panel done at that time. Of note her iron saturation was 5% and a ferritin 52 and she was transfused IV iron per nephrology during that hospitalization. In addition chart review indicates patient was hospitalized in February of this year with severe anemia requiring 3 units of packed RBCs. At that time diagnosed with H. pylori gastritis but according to the notes this was felt  unlikely the cause of bleed. Chart indicates colonoscopy was planned for outpatient followup.  Active Problems: NSTEMI (non-ST elevated myocardial infarction): Likely demand ischemia do to #1. Patient evaluated by cardiology in the emergency department. Chart review indicates patient in the hospital February of this year with anemia and mild troponin elevation thought to be demand ischemia. Cardiology recommendations this hospitalization to proceed with transfusion with Lasix between units. Avoid antiplatelets. Cardiology opined that patient likely has some underlying chronic obstructive  CAD in the setting of severe anemia leads to demand ischemia. He does not suspect acute occlusive disease at this time therefore no indication or plans for invasive testing.    Systolic CHF, chronic last EF 40% 3/13. Patient denies any chest pain lower extremity edema orthopnea. Chest x-ray with mild edema and proBNP is elevated. Will monitor daily weights strict intake and output. Will provide Lasix between units of blood. Of note, lasix ordered in ED before blood but not given as blood started.  Her home medications include demadex, imdur, toprol. Will hold demadex and continue others.     Diabetes mellitus; will obtain a hemoglobin A1c. Will continue her home Levimir at half the dose. Will use sliding scale insulin for optimal control. Will provide a car modified diet    Seizure disorder: No report of recent seizure activity. Likely related to history of CVA. Will continue her home medications    CVA (cerebral infarction): 2012.  Patient's home medication included Aggrenox. Will hold this for now. she is currently at baseline    Weakness generalized: Related to above. Will request physical therapy evaluation      Iron deficiency anemia: Continue iron supplementation. Anemia panel done last month as indicated above. See #1.    Dementia: Appears to be at baseline according to family. Continue home medication     Severe mitral regurgitation: Per cardiology note patient is a poor surgical candidate and recommending continuation of conservative management    Chronic kidney disease (CKD), stage IV (severe): Current creatinine level is within range of baseline. Will monitor intake and output.   Dr Harl Bowie cardiology  Code Status: full Family Communication: daughter at bedside Disposition Plan: home when ready  Time spent: 13 minutes  [facility] Pager [phone]  **Disclaimer: This note may have been dictated with voice recognition software. Similar sounding words can inadvertently be transcribed and this note may contain transcription errors which may not have been corrected upon publication of note.**

## 2014-06-10 DIAGNOSIS — E1129 Type 2 diabetes mellitus with other diabetic kidney complication: Secondary | ICD-10-CM

## 2014-06-10 DIAGNOSIS — I379 Nonrheumatic pulmonary valve disorder, unspecified: Secondary | ICD-10-CM

## 2014-06-10 DIAGNOSIS — D649 Anemia, unspecified: Secondary | ICD-10-CM

## 2014-06-10 DIAGNOSIS — N058 Unspecified nephritic syndrome with other morphologic changes: Secondary | ICD-10-CM

## 2014-06-10 LAB — CBC
HEMATOCRIT: 25.2 % — AB (ref 36.0–46.0)
HEMOGLOBIN: 8.2 g/dL — AB (ref 12.0–15.0)
MCH: 27.7 pg (ref 26.0–34.0)
MCHC: 32.5 g/dL (ref 30.0–36.0)
MCV: 85.1 fL (ref 78.0–100.0)
Platelets: 328 10*3/uL (ref 150–400)
RBC: 2.96 MIL/uL — AB (ref 3.87–5.11)
RDW: 18.3 % — ABNORMAL HIGH (ref 11.5–15.5)
WBC: 5.8 10*3/uL (ref 4.0–10.5)

## 2014-06-10 LAB — TYPE AND SCREEN
ABO/RH(D): A POS
ANTIBODY SCREEN: NEGATIVE
UNIT DIVISION: 0
Unit division: 0

## 2014-06-10 LAB — BASIC METABOLIC PANEL
ANION GAP: 14 (ref 5–15)
BUN: 75 mg/dL — ABNORMAL HIGH (ref 6–23)
CHLORIDE: 105 meq/L (ref 96–112)
CO2: 21 meq/L (ref 19–32)
Calcium: 8.8 mg/dL (ref 8.4–10.5)
Creatinine, Ser: 2.71 mg/dL — ABNORMAL HIGH (ref 0.50–1.10)
GFR calc non Af Amer: 16 mL/min — ABNORMAL LOW (ref >90)
GFR, EST AFRICAN AMERICAN: 19 mL/min — AB (ref >90)
Glucose, Bld: 170 mg/dL — ABNORMAL HIGH (ref 70–99)
POTASSIUM: 3.9 meq/L (ref 3.7–5.3)
Sodium: 140 mEq/L (ref 137–147)

## 2014-06-10 LAB — TSH: TSH: 3.72 u[IU]/mL (ref 0.350–4.500)

## 2014-06-10 LAB — HEMOGLOBIN A1C
HEMOGLOBIN A1C: 6.6 % — AB (ref <5.7)
MEAN PLASMA GLUCOSE: 143 mg/dL — AB (ref <117)

## 2014-06-10 LAB — GLUCOSE, CAPILLARY
GLUCOSE-CAPILLARY: 144 mg/dL — AB (ref 70–99)
Glucose-Capillary: 172 mg/dL — ABNORMAL HIGH (ref 70–99)
Glucose-Capillary: 161 mg/dL — ABNORMAL HIGH (ref 70–99)
Glucose-Capillary: 194 mg/dL — ABNORMAL HIGH (ref 70–99)

## 2014-06-10 MED ORDER — TORSEMIDE 20 MG PO TABS
50.0000 mg | ORAL_TABLET | Freq: Every day | ORAL | Status: DC
  Administered 2014-06-10 – 2014-06-12 (×3): 50 mg via ORAL
  Filled 2014-06-10 (×3): qty 3

## 2014-06-10 NOTE — Consult Note (Signed)
Referring Provider: No ref. provider found Primary Care Physician:  [name], MD Primary Gastroenterologist:  Dr. Gala Romney  Reason for Consultation:  History of iron deficiency anemia  HPI:  73 year old lady with multiple comorbidities admitted to the hospital with a non-ST segment MI the setting of profound anemia. Mr. hemoglobin in the 5 range. She has been transfused up in the 8 range. Stable from cardiac standpoint. No overt GI bleeding. Specifically, no hematemesis, melena or hematochezia. In fact, her Hemoccult was negative this admission. She had a positive Hemoccult back in February of this year. Iron deficiency indices. EGD demonstrated H. pylori gastritis. She took Prevpac. She was to return for colonoscopy but this apparently was never set up. Patient denies nausea, vomiting dysphagia. Denies abdominal pain. Last colonoscopy 2002-reportedly negative.  Past Medical History  Diagnosis Date  . Type 2 diabetes mellitus   . Essential hypertension, benign   . History of stroke     Previously on Coumadin  . MI, old     Reported 57  . Seizure disorder   . Coronary atherosclerosis of native coronary artery     Multivessel status post CABG 2002  . History of GI bleed     Erosive gastritis and duodenitis by EGD 2002  . Mixed hyperlipidemia   . Ischemic cardiomyopathy     LVEF 40-45% March 2013  . Dementia   . CKD (chronic kidney disease) stage 4, GFR 15-29 ml/min   . Stroke   . Seizures     Past Surgical History  Procedure Laterality Date  . Coronary artery bypass graft  July 2002    LIMA to LAD, SVG to OM1, SVG to OM 2, SVG to RCA  . Tee without cardioversion  01/28/2012    Procedure: TRANSESOPHAGEAL ECHOCARDIOGRAM (TEE);  Surgeon: Lelon Perla, MD;  Location: [facility] ENDOSCOPY;  Service: Cardiovascular;  Laterality: [name];  . Colonoscopy  2002  . Esophagogastroduodenoscopy  2002  . Esophagogastroduodenoscopy [name] 01/18/2014    JOI:NOMVE hiatal hernia. Abnormal gastric  and duodenal bulbar mucosa of uncertain significance - status post gastric bx (chronic gastritis/H.pylori +    Prior to Admission medications   Medication Sig Start Date End Date Taking? Authorizing Provider  aspirin EC 81 MG tablet Take 81 mg by mouth every morning.    Yes Historical Provider, MD  calcitRIOL (ROCALTROL) 0.25 MCG capsule Take 1 capsule (0.25 mcg total) by mouth daily. 05/02/14  Yes Eugenie Filler, MD  dipyridamole-aspirin (AGGRENOX) 200-25 MG per 12 hr capsule Take 1 capsule by mouth 2 (two) times daily.   Yes Historical Provider, MD  donepezil (ARICEPT) 10 MG tablet Take 10 mg by mouth at bedtime.  01/30/14  Yes Historical Provider, MD  folic acid (FOLVITE) 1 MG tablet Take 1 mg by mouth every morning.    Yes Historical Provider, MD  hydrALAZINE (APRESOLINE) 25 MG tablet Take 75 mg by mouth every 8 (eight) hours.   Yes Historical Provider, MD  insulin detemir (LEVEMIR) 100 UNIT/ML injection Inject 0.08 mLs (8 Units total) into the skin at bedtime. 05/02/14  Yes Eugenie Filler, MD  isosorbide mononitrate (IMDUR) 30 MG 24 hr tablet Take 0.5 mg by mouth every morning.   Yes Historical Provider, MD  levETIRAcetam (KEPPRA) 100 MG/ML solution Take 5 mLs by mouth 2 (two) times daily. 01/13/14  Yes Historical Provider, MD  linagliptin (TRADJENTA) 5 MG TABS tablet Take 5 mg by mouth every morning.    Yes Historical Provider, MD  metoprolol succinate (  TOPROL-XL) 100 MG 24 hr tablet Take 100 mg by mouth every morning. Take with or immediately following a meal.   Yes Historical Provider, MD  pantoprazole (PROTONIX) 40 MG tablet Take 40 mg by mouth every morning.   Yes Historical Provider, MD  potassium chloride (MICRO-K) 10 MEQ CR capsule Take 10-20 mEq by mouth See admin instructions. Alternate taking one Potassium daily with taking one Potassium twice daily with Torsemide as directed   Yes Historical Provider, MD  pravastatin (PRAVACHOL) 20 MG tablet Take 20 mg by mouth at bedtime.    Yes  Historical Provider, MD  sodium bicarbonate 650 MG tablet Take 650 mg by mouth 3 (three) times daily.   Yes Historical Provider, MD  torsemide (DEMADEX) 100 MG tablet Take 0.5 tablets (50 mg total) by mouth daily. 05/02/14  Yes Eugenie Filler, MD  Vitamin D, Ergocalciferol, (DRISDOL) 50000 UNITS CAPS Take 50,000 Units by mouth every 30 (thirty) days. Takes on Tuesday   Yes Historical Provider, MD    Current Facility-Administered Medications  Medication Dose Route Frequency Provider Last Rate Last Dose  . 0.9 %  sodium chloride infusion  250 mL Intravenous PRN Radene Gunning, NP      . acetaminophen (TYLENOL) tablet 650 mg  650 mg Oral Q6H PRN Radene Gunning, NP       Or  . acetaminophen (TYLENOL) suppository 650 mg  650 mg Rectal Q6H PRN Radene Gunning, NP      . alum & mag hydroxide-simeth (MAALOX/MYLANTA) 200-200-20 MG/5ML suspension 30 mL  30 mL Oral Q6H PRN Radene Gunning, NP      . calcitRIOL (ROCALTROL) capsule 0.25 mcg  0.25 mcg Oral Daily Lezlie Octave Black, NP   0.25 mcg at 06/10/14 7829  . donepezil (ARICEPT) tablet 10 mg  10 mg Oral QHS Radene Gunning, NP   10 mg at 06/09/14 2214  . folic acid (FOLVITE) tablet 1 mg  1 mg Oral q morning - 10a Radene Gunning, NP   1 mg at 06/10/14 5621  . hydrALAZINE (APRESOLINE) tablet 75 mg  75 mg Oral 3 times per day Radene Gunning, NP   75 mg at 06/10/14 0520  . HYDROcodone-acetaminophen (NORCO/VICODIN) 5-325 MG per tablet 1-2 tablet  1-2 tablet Oral Q4H PRN Radene Gunning, NP      . insulin aspart (novoLOG) injection 0-15 Units  0-15 Units Subcutaneous TID WC Radene Gunning, NP   3 Units at 06/10/14 [phone]  . insulin aspart (novoLOG) injection 0-5 Units  0-5 Units Subcutaneous QHS Lezlie Octave Black, NP      . insulin detemir (LEVEMIR) injection 4 Units  4 Units Subcutaneous Daily Radene Gunning, NP   4 Units at 06/10/14 [phone]  . isosorbide mononitrate (IMDUR) 24 hr tablet 15 mg  15 mg Oral Daily Rexene Alberts, MD   15 mg at 06/10/14 0852  . levETIRAcetam (KEPPRA) 100  MG/ML solution 500 mg  500 mg Oral BID Radene Gunning, NP   500 mg at 06/10/14 0851  . metoprolol succinate (TOPROL-XL) 24 hr tablet 100 mg  100 mg Oral q morning - 10a Radene Gunning, NP   100 mg at 06/10/14 0851  . ondansetron (ZOFRAN) tablet 4 mg  4 mg Oral Q6H PRN Radene Gunning, NP       Or  . ondansetron [facility]) injection 4 mg  4 mg Intravenous Q6H PRN Radene Gunning, NP      .  pantoprazole (PROTONIX) EC tablet 40 mg  40 mg Oral q morning - 10a Radene Gunning, NP   40 mg at 06/10/14 0851  . simvastatin (ZOCOR) tablet 5 mg  5 mg Oral q1800 Radene Gunning, NP   5 mg at 06/09/14 1648  . sodium bicarbonate tablet 650 mg  650 mg Oral TID Radene Gunning, NP   650 mg at 06/10/14 9811  . sodium chloride 0.9 % injection 3 mL  3 mL Intravenous Q12H Radene Gunning, NP   3 mL at 06/10/14 0852  . sodium chloride 0.9 % injection 3 mL  3 mL Intravenous PRN Radene Gunning, NP      . torsemide [facility]) tablet 50 mg  50 mg Oral Daily Rexene Alberts, MD        Allergies as of 06/09/2014  . (No Known Allergies)    Family History  Problem Relation Age of Onset  . Stroke Father   . Diabetes type II Mother   . Colon cancer Neg Hx     History   Social History  . Marital Status: Single    Spouse Name: [name]    Number of Children: [name]  . Years of Education: [name]   Occupational History  . Not on file.   Social History Main Topics  . Smoking status: Former Smoker -- 1.00 packs/day for 50 years    Types: Cigarettes  . Smokeless tobacco: Not on file  . Alcohol Use: No  . Drug Use: No  . Sexual Activity: Not on file   Other Topics Concern  . Not on file   Social History Narrative  . No narrative on file    Review of Systems: Gen: Denies any fever, chills, sweats, anorexia, fatigue, weakness, malaise, weight loss, and sleep disorder CV: Denies chest pain, angina, palpitations, syncope, orthopnea, PND, peripheral edema, and claudication. Resp: Denies dyspnea at rest, dyspnea with exercise, cough,  sputum, wheezing, coughing up blood, and pleurisy. GI: Denies vomiting blood, jaundice, and fecal incontinence.   Denies dysphagia or odynophagia. Derm: Denies rash, itching, dry skin, hives, moles, warts, or unhealing ulcers.  Psych: Denies depression, anxiety, memory loss, suicidal ideation, hallucinations, paranoia, and confusion. Heme: Denies bruising, bleeding, and enlarged lymph nodes.   Physical Exam: Vital signs in last 24 hours: Temp:  [97.3 F (36.3 C)-98.6 F (37 C)] 97.6 F (36.4 C) (07/18 0751) Pulse Rate:  [47-110] 78 (07/18 0800) Resp:  [17-24] 20 (07/18 0800) BP: (118-176)/(50-134) 152/68 mmHg (07/18 0800) SpO2:  [80 %-100 %] 99 % (07/18 0800) Weight:  [135 lb 9.3 oz (61.5 kg)-137 lb 12.6 oz (62.5 kg)] 135 lb 9.3 oz (61.5 kg) (07/18 0500) Last BM Date: 06/08/14 General:   Awake and alert. Accompanied by her daughter Sallyanne Havers in ICU room 9  Head:  Normocephalic and atraumatic. Eyes:  Sclera clear, no icterus.   Conjunctiva pink. Ears:  Normal auditory acuity. Nose:  No deformity, discharge,  or lesions. Mouth:  No deformity or lesions, dentition normal. Neck:  Supple; no masses or thyromegaly. Lungs:  Clear throughout to auscultation.   No wheezes, crackles, or rhonchi. No acute distress. Heart:  Regular rate and rhythm; 2/6 systolic ejection murmur Abdomen:  Nondistended. Positive bowel sounds. Soft and nontender without appreciable mass or organomegaly.   Rectal:  Deferred until time of colonoscopy.   Msk:  Symmetrical without gross deformities. Normal posture. Pulses:  Normal pulses noted. Extremities:  Without clubbing or edema. Neurologic:  Alert and  oriented  x4;    Intake/Output from previous day: 07/17 0701 - 07/18 0700 In: 1740 [P.O.:840; I.V.:250; Blood:650] Out: 3350 [Urine:3350] Intake/Output this shift: Total I/O In: 240 [P.O.:240] Out: -   Lab Results:  Recent Labs  06/09/14 1021 06/09/14 1859 06/10/14 0543  WBC 6.0 5.7 5.8  HGB  5.5* 8.0* 8.2*  HCT 18.1* 24.7* 25.2*  PLT 382 324 328   BMET  Recent Labs  06/09/14 1021 06/10/14 0543  NA 139 140  K 4.4 3.9  CL 106 105  CO2 19 21  GLUCOSE 229* 170*  BUN 82* 75*  CREATININE 2.96* 2.71*  CALCIUM 8.7 8.8   LFT  Recent Labs  06/09/14 1021  PROT 8.6*  ALBUMIN 2.9*  AST 22  ALT 8  ALKPHOS 70  BILITOT 0.3    Studies/Results: Dg Chest Portable 1 View  06/09/2014   CLINICAL DATA:  Abnormal labs, history hypertension, diabetes, stroke, coronary artery disease post MI  EXAM: PORTABLE CHEST - 1 VIEW  COMPARISON:  Portable exam 1028 hr compared to 04/27/2014  FINDINGS: Enlargement of cardiac silhouette post CABG.  Slight pulmonary vascular congestion.  Mediastinal contours normal.  No acute failure or consolidation.  No pleural effusion or pneumothorax.  Bones unremarkable.  IMPRESSION: Enlargement of cardiac silhouette with mild pulmonary vascular congestion post CABG.  No acute abnormalities.   Electronically Signed   By: Lavonia Dana M.D.   On: 06/09/2014 10:41    Impression: Pleasant 73 year old lady with multiple medical problems admitted to the hospital acute on chronic anemia. History of documented iron deficiency and probably an element of anemia of chronic disease as well. Non-STEMI MI this admission. Status post 2 unit transfusion. Hemoccult negative. Hemoccult positive back in February of this year. GI evaluation not complete. It was recommended this lady have a colonoscopy earlier in the year but has not been done. She did take treatment for Helicobacter pylori infection.  Recommendations:  Conservative management at this time. Agree with continuing proton pump inhibitor therapy  if she continues her Aggrenox regimen. As discussed with Dr. Caryn Section, the benefits of continuing Aggrenox therapy probably outweigh the risks particularly with concomitant PPI therapy. Would follow her H&H closely keep her hemoglobin in the 8 range. We'll plan to see her back in the  office in about 3-4 weeks and will reassess her fitness for a diagnostic colonoscopy. My impression and recommendations have been discussed with Dr. Caryn Section and the patient's daughter, Sallyanne Havers at length.  I;d  like to thank Dr. Caryn Section for allowing me to see this nice lady once again.   Notice:  This dictation was prepared with Dragon dictation along with smaller phrase technology. Any transcriptional errors that result from this process are unintentional and may not be corrected upon review.

## 2014-06-10 NOTE — Progress Notes (Signed)
Echocardiogram 2D Echocardiogram has been performed.  Doyle Askew 06/10/2014, 11:36 AM

## 2014-06-10 NOTE — Plan of Care (Signed)
Problem: Phase I Progression Outcomes Goal: Pain controlled with appropriate interventions Outcome: Completed/Met Date Met:  06/10/14 No complaints of pain Goal: OOB as tolerated unless otherwise ordered Outcome: Progressing oob to bsc with assistance Goal: Hemodynamically stable Outcome: Progressing Hypertension at onset of shift has resolved with medications

## 2014-06-10 NOTE — Progress Notes (Addendum)
[facility] PROGRESS NOTE  [name] ZES:[id number] DOB: November 30, 1940 DOA: 06/09/2014 PCP: [name], MD    Code Status: Full code Family Communication: Discussed with patient, no family available. Disposition Plan: Discharged when clinically appropriate.   Consultants:  GI pending.  Cardiology  Procedures:  7/17: Transfusion of 2 units of packed red blood cells.  Antibiotics:  None  HPI/Subjective: The patient is lying in bed. She has no complaints. No reports of melena or hematochezia overnight per nursing.  Objective: Filed Vitals:   06/10/14 0800  BP: 152/68  Pulse: 78  Temp:   Resp: 20   temperature 97.6. Oxygen saturation 99%.  Intake/Output Summary (Last 24 hours) at 06/10/14 1002 Last data filed at 06/10/14 0956  Gross per 24 hour  Intake   1980 ml  Output   3350 ml  Net  -1370 ml   Filed Weights   06/09/14 1009 06/09/14 1254 06/10/14 0500  Weight: 61.236 kg (135 lb) 62.5 kg (137 lb 12.6 oz) 61.5 kg (135 lb 9.3 oz)    Exam:   General: Pleasant alert 73 year old woman sitting up in bed, in no acute distress.  Cardiovascular: S1, S2, with a 2-6/3 systolic murmur.  Respiratory: Rare crackles in the bases, otherwise mostly clear anteriorly. Breathing is nonlabored.  Abdomen: Positive bowel sounds, soft, nontender, nondistended.  Musculoskeletal/extremities: No acute hot red joints. Pedal pulses barely palpable. No pedal edema.  Neurologic: She is alert and oriented to herself and hospital. Her speech is clear. Cranial nerves II through XII are grossly intact.   Data Reviewed: Basic Metabolic Panel:  Recent Labs Lab 06/09/14 1021 06/10/14 0543  NA 139 140  K 4.4 3.9  CL 106 105  CO2 19 21  GLUCOSE 229* 170*  BUN 82* 75*  CREATININE 2.96* 2.71*  CALCIUM 8.7 8.8   Liver Function Tests:  Recent Labs Lab 06/09/14 1021  AST 22  ALT 8  ALKPHOS 70  BILITOT 0.3  PROT 8.6*  ALBUMIN 2.9*   No results found for this  basename: LIPASE, AMYLASE,  in the last 168 hours No results found for this basename: AMMONIA,  in the last 168 hours CBC:  Recent Labs Lab 06/09/14 1021 06/09/14 1859 06/10/14 0543  WBC 6.0 5.7 5.8  NEUTROABS 4.6  --   --   HGB 5.5* 8.0* 8.2*  HCT 18.1* 24.7* 25.2*  MCV 88.3 87.0 85.1  PLT 382 324 328   Cardiac Enzymes:  Recent Labs Lab 06/09/14 1021  TROPONINI 0.48*   BNP (last 3 results)  Recent Labs  03/15/14 1645 04/27/14 1536 06/09/14 1021  PROBNP 37134.0* 51631.0* 30358.0*   CBG:  Recent Labs Lab 06/09/14 1616 06/09/14 2100 06/09/14 2212 06/10/14 0742  GLUCAP 197* 211* 165* 172*    Recent Results (from the past 240 hour(s))  MRSA PCR SCREENING     Status: None   Collection Time    06/09/14 12:56 PM      Result Value Ref Range Status   MRSA by PCR NEGATIVE  NEGATIVE Final   Comment:            The GeneXpert MRSA Assay (FDA     approved for NASAL specimens     only), is one component of a     comprehensive MRSA colonization     surveillance program. It is not     intended to diagnose MRSA     infection nor to guide or     monitor treatment for  MRSA infections.     Studies: Dg Chest Portable 1 View  06/09/2014   CLINICAL DATA:  Abnormal labs, history hypertension, diabetes, stroke, coronary artery disease post MI  EXAM: PORTABLE CHEST - 1 VIEW  COMPARISON:  Portable exam 1028 hr compared to 04/27/2014  FINDINGS: Enlargement of cardiac silhouette post CABG.  Slight pulmonary vascular congestion.  Mediastinal contours normal.  No acute failure or consolidation.  No pleural effusion or pneumothorax.  Bones unremarkable.  IMPRESSION: Enlargement of cardiac silhouette with mild pulmonary vascular congestion post CABG.  No acute abnormalities.   Electronically Signed   By: Lavonia Dana M.D.   On: 06/09/2014 10:41    Scheduled Meds: . calcitRIOL  0.25 mcg Oral Daily  . donepezil  10 mg Oral QHS  . folic acid  1 mg Oral q morning - 10a  .  hydrALAZINE  75 mg Oral 3 times per day  . insulin aspart  0-15 Units Subcutaneous TID WC  . insulin aspart  0-5 Units Subcutaneous QHS  . insulin detemir  4 Units Subcutaneous Daily  . isosorbide mononitrate  15 mg Oral Daily  . levETIRAcetam  500 mg Oral BID  . metoprolol succinate  100 mg Oral q morning - 10a  . pantoprazole  40 mg Oral q morning - 10a  . simvastatin  5 mg Oral q1800  . sodium bicarbonate  650 mg Oral TID  . sodium chloride  3 mL Intravenous Q12H   Continuous Infusions:   Assessment/plan:  Principal Problem:   Anemia Active Problems:   NSTEMI (non-ST elevated myocardial infarction)   Acute on chronic combined systolic and diastolic congestive heart failure   Severe mitral regurgitation   Chronic kidney disease (CKD), stage IV (severe)   Diabetes mellitus   Seizure disorder   CVA (cerebral infarction)   Weakness generalized   Iron deficiency anemia   Dementia    1. Acute on chronic anemia, possibly secondary to chronic blood loss versus the consequence of stage IV chronic kidney disease.  Status post transfusion of 2 units of packed red blood cells on 7/17. Her hemoglobin is now above 8 g. Will hold on further transfusion. Her previous hospitalization in February 2015 required 3 units of packed red blood cell transfusions. EGD at that time revealed chronic gastritis and H. pylori positive. Per chart review, outpatient colonoscopy was mentioned, but apparently not done yet. Will continue PPI. Will hold Aggrenox pending GI consultation. If no endoscopic procedures done during this hospitalization, would restart Aggrenox and/or aspirin with close monitoring.  Non-ST elevation myocardial infarction, likely from demand ischemia in the setting of severe anemia. The patient has a history of coronary artery disease with history of CABG in 2002. Her troponin I was elevated on admission, but apparently followup troponin I's were not ordered. Cardiologist, Dr. Harl Bowie  recommends conservative management with no plans for invasive testing due to to her advanced age, renal dysfunction, dementia, and recurrent anemia. We'll continue current management with exception of holding Aggrenox and aspirin until after GI evaluation.  Hypertension. Currently stable. Continue multiple antihypertensive medications.  Mild acute on chronic systolic and diastolic heart failure. The patient was given 20 mg of IV Lasix before and following the blood transfusions on 7/17. We will restart torsemide. She is currently breathing without distress. Her lungs are clear. TSH ordered and is pending. Echocardiogram ordered and is pending.  Severe MR. Per cardiology, she is a poor surgical candidate. Continue conservative management.  Stage IV chronic kidney disease. Her  creatinine is about at baseline, but has improved with IV Lasix and packed red blood cell transfusions. Continue current management.  Diabetes mellitus. Hemoglobin A1c is 6.7. Blood glucose is reasonable, below 200 currently. Continue sliding scale NovoLog and Levemir. Continue to hold Tradjenta for now.  Seizure disorder. Currently stable. Continue Keppra.  Will transfer the patient to telemetry. We'll order PT evaluation.     Time spent: 40 minutes.    [facility] Pager [phone]. If 7PM-7AM, please contact night-coverage at [email or web address], password [email or web address] 06/10/2014, 10:02 AM  LOS: 1 day

## 2014-06-11 DIAGNOSIS — F0390 Unspecified dementia without behavioral disturbance: Secondary | ICD-10-CM

## 2014-06-11 DIAGNOSIS — G40909 Epilepsy, unspecified, not intractable, without status epilepticus: Secondary | ICD-10-CM

## 2014-06-11 DIAGNOSIS — N179 Acute kidney failure, unspecified: Secondary | ICD-10-CM

## 2014-06-11 DIAGNOSIS — N189 Chronic kidney disease, unspecified: Secondary | ICD-10-CM

## 2014-06-11 LAB — BASIC METABOLIC PANEL
ANION GAP: 12 (ref 5–15)
BUN: 67 mg/dL — ABNORMAL HIGH (ref 6–23)
CALCIUM: 9 mg/dL (ref 8.4–10.5)
CO2: 23 mEq/L (ref 19–32)
Chloride: 104 mEq/L (ref 96–112)
Creatinine, Ser: 2.34 mg/dL — ABNORMAL HIGH (ref 0.50–1.10)
GFR calc Af Amer: 23 mL/min — ABNORMAL LOW (ref >90)
GFR, EST NON AFRICAN AMERICAN: 20 mL/min — AB (ref >90)
Glucose, Bld: 159 mg/dL — ABNORMAL HIGH (ref 70–99)
Potassium: 3.7 mEq/L (ref 3.7–5.3)
SODIUM: 139 meq/L (ref 137–147)

## 2014-06-11 LAB — CBC
HCT: 26.1 % — ABNORMAL LOW (ref 36.0–46.0)
Hemoglobin: 8.4 g/dL — ABNORMAL LOW (ref 12.0–15.0)
MCH: 27.6 pg (ref 26.0–34.0)
MCHC: 32.2 g/dL (ref 30.0–36.0)
MCV: 85.9 fL (ref 78.0–100.0)
PLATELETS: 346 10*3/uL (ref 150–400)
RBC: 3.04 MIL/uL — ABNORMAL LOW (ref 3.87–5.11)
RDW: 18.6 % — ABNORMAL HIGH (ref 11.5–15.5)
WBC: 5.3 10*3/uL (ref 4.0–10.5)

## 2014-06-11 LAB — TROPONIN I: Troponin I: <0.3 ng/mL (ref <0.30)

## 2014-06-11 LAB — GLUCOSE, CAPILLARY
GLUCOSE-CAPILLARY: 156 mg/dL — AB (ref 70–99)
Glucose-Capillary: 151 mg/dL — ABNORMAL HIGH (ref 70–99)
Glucose-Capillary: 213 mg/dL — ABNORMAL HIGH (ref 70–99)

## 2014-06-11 MED ORDER — ASPIRIN-DIPYRIDAMOLE ER 25-200 MG PO CP12
1.0000 | ORAL_CAPSULE | Freq: Two times a day (BID) | ORAL | Status: DC
  Administered 2014-06-11 – 2014-06-12 (×3): 1 via ORAL
  Filled 2014-06-11 (×7): qty 1

## 2014-06-11 NOTE — Progress Notes (Signed)
Removed pt's foley per protocol. Pt tolerated removal well. DTV at 16. Will continue to monitor.

## 2014-06-11 NOTE — Progress Notes (Signed)
[facility] PROGRESS NOTE  [name] ZDG:[id number] DOB: 08-Dec-1940 DOA: 06/09/2014 PCP: [name], MD    Code Status: Full code Family Communication: Discussed with patient, no family available. Disposition Plan: Discharged when clinically appropriate. Physical therapy evaluation has been ordered.   Consultants:  GI   Cardiology  Procedures:  7/17: Transfusion of 2 units of packed red blood cells. Echo: - Left ventricle: The cavity size was normal. Wall thickness was increased in a pattern of mild LVH. Systolic function was mildly reduced. The estimated ejection fraction was in the range of 45% to 50%. Features are consistent with a pseudonormal left ventricular filling pattern, with concomitant abnormal relaxation and increased filling pressure (grade 2 diastolic dysfunction). - Aortic valve: Mildly calcified annulus. Trileaflet; mildly thickened leaflets. Valve area (VTI): 2.07 cm^2. Valve area (Vmax): 1.84 cm^2. - Mitral valve: Mildly calcified annulus. Mildly thickened leaflets . There is eccentric posterior MR, the eccentricity of the jet may lead to underestimaton of severity. MR vena contracta is 0.5 cm. - Left atrium: The atrium was moderately dilated. - Right ventricle: The cavity size was mildly dilated. Systolic function was mildly to moderately reduced. RV TAPSE is 1.3 cm. - Right atrium: The atrium was mildly dilated. - Technically adequate study.    Antibiotics:  None  HPI/Subjective: She had a large bowel movement yesterday that was reported to be brown/non bloody. Denies any other complaints.  Objective: Filed Vitals:   06/11/14 0800  BP: 121/47  Pulse: 66  Temp:   Resp: 16  .  Intake/Output Summary (Last 24 hours) at 06/11/14 0923 Last data filed at 06/11/14 0742  Gross per 24 hour  Intake   1083 ml  Output   2750 ml  Net  -1667 ml   Filed Weights   06/09/14 1254 06/10/14 0500 06/11/14 0500  Weight: 62.5 kg (137 lb  12.6 oz) 61.5 kg (135 lb 9.3 oz) 61 kg (134 lb 7.7 oz)    Exam:   General: Pleasant alert 73 year old woman sitting up in bed, in no acute distress.  Cardiovascular: S1, S2, with a 5-9/5 systolic murmur.  Respiratory: Clear to ascultation bilaterally. Breathing is nonlabored.  Abdomen: Positive bowel sounds, soft, nontender, nondistended.  Musculoskeletal/extremities: No pedal edema.  Neurologic: She is alert and oriented to herself and hospital. Her speech is clear. Cranial nerves II through XII are grossly intact.   Data Reviewed: Basic Metabolic Panel:  Recent Labs Lab 06/09/14 1021 06/10/14 0543 06/11/14 0505  NA 139 140 139  K 4.4 3.9 3.7  CL 106 105 104  CO2 19 21 23   GLUCOSE 229* 170* 159*  BUN 82* 75* 67*  CREATININE 2.96* 2.71* 2.34*  CALCIUM 8.7 8.8 9.0   Liver Function Tests:  Recent Labs Lab 06/09/14 1021  AST 22  ALT 8  ALKPHOS 70  BILITOT 0.3  PROT 8.6*  ALBUMIN 2.9*   No results found for this basename: LIPASE, AMYLASE,  in the last 168 hours No results found for this basename: AMMONIA,  in the last 168 hours CBC:  Recent Labs Lab 06/09/14 1021 06/09/14 1859 06/10/14 0543 06/11/14 0505  WBC 6.0 5.7 5.8 5.3  NEUTROABS 4.6  --   --   --   HGB 5.5* 8.0* 8.2* 8.4*  HCT 18.1* 24.7* 25.2* 26.1*  MCV 88.3 87.0 85.1 85.9  PLT 382 324 328 346   Cardiac Enzymes:  Recent Labs Lab 06/09/14 1021 06/11/14 0505  TROPONINI 0.48* <0.30   BNP (last 3 results)  Recent Labs  03/15/14 1645 04/27/14 1536 06/09/14 1021  PROBNP 37134.0* 51631.0* 30358.0*   CBG:  Recent Labs Lab 06/10/14 0742 06/10/14 1149 06/10/14 1638 06/10/14 2125 06/11/14 0800  GLUCAP 172* 161* 144* 194* 151*    Recent Results (from the past 240 hour(s))  MRSA PCR SCREENING     Status: None   Collection Time    06/09/14 12:56 PM      Result Value Ref Range Status   MRSA by PCR NEGATIVE  NEGATIVE Final   Comment:            The GeneXpert MRSA Assay (FDA      approved for NASAL specimens     only), is one component of a     comprehensive MRSA colonization     surveillance program. It is not     intended to diagnose MRSA     infection nor to guide or     monitor treatment for     MRSA infections.     Studies: Dg Chest Portable 1 View  06/09/2014   CLINICAL DATA:  Abnormal labs, history hypertension, diabetes, stroke, coronary artery disease post MI  EXAM: PORTABLE CHEST - 1 VIEW  COMPARISON:  Portable exam 1028 hr compared to 04/27/2014  FINDINGS: Enlargement of cardiac silhouette post CABG.  Slight pulmonary vascular congestion.  Mediastinal contours normal.  No acute failure or consolidation.  No pleural effusion or pneumothorax.  Bones unremarkable.  IMPRESSION: Enlargement of cardiac silhouette with mild pulmonary vascular congestion post CABG.  No acute abnormalities.   Electronically Signed   By: Lavonia Dana M.D.   On: 06/09/2014 10:41    Scheduled Meds: . calcitRIOL  0.25 mcg Oral Daily  . donepezil  10 mg Oral QHS  . folic acid  1 mg Oral q morning - 10a  . hydrALAZINE  75 mg Oral 3 times per day  . insulin aspart  0-15 Units Subcutaneous TID WC  . insulin aspart  0-5 Units Subcutaneous QHS  . insulin detemir  4 Units Subcutaneous Daily  . isosorbide mononitrate  15 mg Oral Daily  . levETIRAcetam  500 mg Oral BID  . metoprolol succinate  100 mg Oral q morning - 10a  . pantoprazole  40 mg Oral q morning - 10a  . simvastatin  5 mg Oral q1800  . sodium bicarbonate  650 mg Oral TID  . sodium chloride  3 mL Intravenous Q12H  . torsemide  50 mg Oral Daily   Continuous Infusions:   Assessment/plan:  Principal Problem:   Anemia Active Problems:   Diabetes mellitus   Seizure disorder   CVA (cerebral infarction)   Weakness generalized   NSTEMI (non-ST elevated myocardial infarction)   Iron deficiency anemia   Dementia   Severe mitral regurgitation   Acute on chronic combined systolic and diastolic congestive heart failure    Chronic kidney disease (CKD), stage IV (severe)    1. Acute on chronic anemia, possibly secondary to chronic blood loss versus the consequence of stage IV chronic kidney disease.  Status post transfusion of 2 units of packed red blood cells on 7/17. Her hemoglobin is now above 8 g. Will hold on further transfusion. Her previous hospitalization in February 2015 required 3 units of packed red blood cell transfusions. EGD at that time revealed chronic gastritis and H. pylori positive. Per chart review, outpatient colonoscopy was mentioned, but apparently not done yet. Will continue PPI and restart aggrenox based on input from GI. It does  not appear that any endoscopic procedures are planned during this hospitalization. Plans are to follow with GI in 3-4 weeks to be considered for outpatient colonoscopy.  Non-ST elevation myocardial infarction, likely from demand ischemia in the setting of severe anemia. The patient has a history of coronary artery disease with history of CABG in 2002. Her troponin I was elevated on admission, but apparently followup troponin I's were not ordered. Cardiologist, Dr. Harl Bowie recommends conservative management with no plans for invasive testing due to to her advanced age, renal dysfunction, dementia, and recurrent anemia. She has been evaluated by Gastroenterology and it was felt that benefits of restarting antiplatelet therapy with concomitant PPI probably outweigh risks.  Will restart aggrenox today.  Hypertension. Currently stable. Continue multiple antihypertensive medications.  Mild acute on chronic systolic and diastolic heart failure. The patient was given 20 mg of IV Lasix before and following the blood transfusions on 7/17. We will restart torsemide. She is currently breathing without distress. Her lungs are clear. TSH is in normal range. Echocardiogram shows ejection fraction comparable to prior studies.  Severe MR. Per cardiology, she is a poor surgical  candidate. Continue conservative management.  Stage IV chronic kidney disease. Her creatinine is about at baseline, but has improved with IV Lasix and packed red blood cell transfusions. Continue current management.  Diabetes mellitus. Hemoglobin A1c is 6.7. Blood glucose is reasonable, below 200 currently. Continue sliding scale NovoLog and Levemir. Continue to hold Tradjenta for now.  Seizure disorder.  Currently stable. Continue Keppra.  Physical therapy consultation pending.     Time spent: 25 minutes.    [facility] Pager [phone]. If 7PM-7AM, please contact night-coverage at [email or web address], password [facility] 06/11/2014, 9:23 AM  LOS: 2 days

## 2014-06-12 ENCOUNTER — Ambulatory Visit: Payer: Medicare Other | Admitting: Gastroenterology

## 2014-06-12 DIAGNOSIS — D649 Anemia, unspecified: Secondary | ICD-10-CM

## 2014-06-12 LAB — CBC
HCT: 28.5 % — ABNORMAL LOW (ref 36.0–46.0)
HEMOGLOBIN: 9.1 g/dL — AB (ref 12.0–15.0)
MCH: 27.8 pg (ref 26.0–34.0)
MCHC: 31.9 g/dL (ref 30.0–36.0)
MCV: 87.2 fL (ref 78.0–100.0)
PLATELETS: 379 10*3/uL (ref 150–400)
RBC: 3.27 MIL/uL — AB (ref 3.87–5.11)
RDW: 18.7 % — ABNORMAL HIGH (ref 11.5–15.5)
WBC: 5.3 10*3/uL (ref 4.0–10.5)

## 2014-06-12 LAB — GLUCOSE, CAPILLARY
Glucose-Capillary: 157 mg/dL — ABNORMAL HIGH (ref 70–99)
Glucose-Capillary: 185 mg/dL — ABNORMAL HIGH (ref 70–99)

## 2014-06-12 NOTE — Discharge Summary (Signed)
Physician Discharge Summary  [name] ZSW:[id number] DOB: [birth year] DOA: 06/09/2014  PCP: [name], MD  Admit date: 06/09/2014 Discharge date: 06/12/2014  Time spent: 40 minutes  Recommendations for Outpatient Follow-up:  1. Followup with gastroenterology in 3-4 weeks to consider outpatient colonoscopy 2. Follow up with her regular physician in one to 2 weeks  Discharge Diagnoses:  Principal Problem:   Anemia Active Problems:   Diabetes mellitus   Seizure disorder   CVA (cerebral infarction)   Weakness generalized   NSTEMI (non-ST elevated myocardial infarction)   Iron deficiency anemia   Dementia   Severe mitral regurgitation   Acute on chronic combined systolic and diastolic congestive heart failure   Chronic kidney disease (CKD), stage IV (severe)   Discharge Condition: Improved  Diet recommendation: Low salt, low carb  Filed Weights   06/10/14 0500 06/11/14 0500 06/12/14 0500  Weight: 61.5 kg (135 lb 9.3 oz) 61 kg (134 lb 7.7 oz) 60.5 kg (133 lb 6.1 oz)    History of present illness:  This patient was admitted from the emergency room she was found to have a hemoglobin of 5.5. She had caught her primary care physician prior to coming to the emergency room due to weakness shortness of breath. When labs were checked at her primary care physician she was found to be significantly anemic she was referred to the emergency room  Hospital Course:  Patient was found to have acute on chronic anemia, felt to be possibly related to chronic blood loss versus stage IV chronic kidney disease. She received 2 units of packed red blood cells on 7/17. Her hemoglobin improved and has been stable since that time. Aspirin Aggrenox was held on admission. She was seen by gastroenterology who did not feel that EGD would need to be repeated at this time. It was felt that it would be reasonable to restart the patient on Aggrenox with concomitant proton pump inhibitors since her hemoglobin  has remained stable. The benefits of Aggrenox or proton pump this was felt to outweigh the risks of bleeding. The patient was started on Aggrenox and did not have any recurrence of bleeding. She will followup with GI in 3-4 weeks to consider outpatient colonoscopy.  She was noted to have a significant elevation in her cardiac enzymes. This is felt to be non-ST elevation MI due to demand ischemia. She was followed by cardiology and no invasive testing were recommended due to her advanced age, renal dysfunction and dementia.  Procedures: 7/17: Transfusion of 2 units of packed red blood cells.  Echo: - Left ventricle: The cavity size was normal. Wall thickness was increased in a pattern of mild LVH. Systolic function was mildly reduced. The estimated ejection fraction was in the range of 45% to 50%. Features are consistent with a pseudonormal left ventricular filling pattern, with concomitant abnormal relaxation and increased filling pressure (grade 2 diastolic dysfunction). - Aortic valve: Mildly calcified annulus. Trileaflet; mildly thickened leaflets. Valve area (VTI): 2.07 cm^2. Valve area (Vmax): 1.84 cm^2. - Mitral valve: Mildly calcified annulus. Mildly thickened leaflets . There is eccentric posterior MR, the eccentricity of the jet may lead to underestimaton of severity. MR vena contracta is 0.5 cm. - Left atrium: The atrium was moderately dilated. - Right ventricle: The cavity size was mildly dilated. Systolic function was mildly to moderately reduced. RV TAPSE is 1.3 cm. - Right atrium: The atrium was mildly dilated. - Technically adequate study.   Consultations:  Gastroenterology  Cardiology  Discharge Exam: Danley Danker  Vitals:   06/12/14 0700  BP: 140/62  Pulse: 62  Temp: 98 F (36.7 C)  Resp: 14    General: NAD Cardiovascular: S1, S2 RRR Respiratory: CTA B   Discharge Instructions You were cared for by a hospitalist during your hospital stay. If you have any  questions about your discharge medications or the care you received while you were in the hospital after you are discharged, you can call the unit and asked to speak with the hospitalist on call if the hospitalist that took care of you is not available. Once you are discharged, your primary care physician will handle any further medical issues. Please note that NO REFILLS for any discharge medications will be authorized once you are discharged, as it is imperative that you return to your primary care physician (or establish a relationship with a primary care physician if you do not have one) for your aftercare needs so that they can reassess your need for medications and monitor your lab values.  Discharge Instructions   Call MD for:  extreme fatigue    Complete by:  As directed      Call MD for:  persistant dizziness or light-headedness    Complete by:  As directed      Diet - low sodium heart healthy    Complete by:  As directed      Increase activity slowly    Complete by:  As directed             Medication List    STOP taking these medications       aspirin EC 81 MG tablet      TAKE these medications       calcitRIOL 0.25 MCG capsule  Commonly known as:  ROCALTROL  Take 1 capsule (0.25 mcg total) by mouth daily.     dipyridamole-aspirin 200-25 MG per 12 hr capsule  Commonly known as:  AGGRENOX  Take 1 capsule by mouth 2 (two) times daily.     donepezil 10 MG tablet  Commonly known as:  ARICEPT  Take 10 mg by mouth at bedtime.     folic acid 1 MG tablet  Commonly known as:  FOLVITE  Take 1 mg by mouth every morning.     hydrALAZINE 25 MG tablet  Commonly known as:  APRESOLINE  Take 75 mg by mouth every 8 (eight) hours.     insulin detemir 100 UNIT/ML injection  Commonly known as:  LEVEMIR  Inject 0.08 mLs (8 Units total) into the skin at bedtime.     isosorbide mononitrate 30 MG 24 hr tablet  Commonly known as:  IMDUR  Take 0.5 mg by mouth every morning.      levETIRAcetam 100 MG/ML solution  Commonly known as:  KEPPRA  Take 5 mLs by mouth 2 (two) times daily.     metoprolol succinate 100 MG 24 hr tablet  Commonly known as:  TOPROL-XL  Take 100 mg by mouth every morning. Take with or immediately following a meal.     pantoprazole 40 MG tablet  Commonly known as:  PROTONIX  Take 40 mg by mouth every morning.     potassium chloride 10 MEQ CR capsule  Commonly known as:  MICRO-K  Take 10-20 mEq by mouth See admin instructions. Alternate taking one Potassium daily with taking one Potassium twice daily with Torsemide as directed     pravastatin 20 MG tablet  Commonly known as:  PRAVACHOL  Take 20 mg by mouth  at bedtime.     sodium bicarbonate 650 MG tablet  Take 650 mg by mouth 3 (three) times daily.     torsemide 100 MG tablet  Commonly known as:  DEMADEX  Take 0.5 tablets (50 mg total) by mouth daily.     TRADJENTA 5 MG Tabs tablet  Generic drug:  linagliptin  Take 5 mg by mouth every morning.     Vitamin D (Ergocalciferol) 50000 UNITS Caps capsule  Commonly known as:  DRISDOL  Take 50,000 Units by mouth every 30 (thirty) days. Takes on Tuesday       No Known Allergies     Follow-up Information   Follow up with [facility], MD. Schedule an appointment as soon as possible for a visit in 2 weeks.   Specialty:  Family Medicine   Contact information:   [address] [place] [address] [phone]       Follow up with Manus Rudd, MD. Schedule an appointment as soon as possible for a visit in 3 weeks.   Specialty:  Gastroenterology   Contact information:   [place] [address] [place] [address] [phone]        The results of significant diagnostics from this hospitalization (including imaging, microbiology, ancillary and laboratory) are listed below for reference.    Significant Diagnostic Studies: Dg Chest Portable 1 View  06/09/2014   CLINICAL DATA:  Abnormal labs,  history hypertension, diabetes, stroke, coronary artery disease post MI  EXAM: PORTABLE CHEST - 1 VIEW  COMPARISON:  Portable exam 1028 hr compared to 04/27/2014  FINDINGS: Enlargement of cardiac silhouette post CABG.  Slight pulmonary vascular congestion.  Mediastinal contours normal.  No acute failure or consolidation.  No pleural effusion or pneumothorax.  Bones unremarkable.  IMPRESSION: Enlargement of cardiac silhouette with mild pulmonary vascular congestion post CABG.  No acute abnormalities.   Electronically Signed   By: Lavonia Dana M.D.   On: 06/09/2014 10:41    Microbiology: Recent Results (from the past 240 hour(s))  MRSA PCR SCREENING     Status: None   Collection Time    06/09/14 12:56 PM      Result Value Ref Range Status   MRSA by PCR NEGATIVE  NEGATIVE Final   Comment:            The GeneXpert MRSA Assay (FDA     approved for NASAL specimens     only), is one component of a     comprehensive MRSA colonization     surveillance program. It is not     intended to diagnose MRSA     infection nor to guide or     monitor treatment for     MRSA infections.     Labs: Basic Metabolic Panel:  Recent Labs Lab 06/09/14 1021 06/10/14 0543 06/11/14 0505  NA 139 140 139  K 4.4 3.9 3.7  CL 106 105 104  CO2 19 21 23   GLUCOSE 229* 170* 159*  BUN 82* 75* 67*  CREATININE 2.96* 2.71* 2.34*  CALCIUM 8.7 8.8 9.0   Liver Function Tests:  Recent Labs Lab 06/09/14 1021  AST 22  ALT 8  ALKPHOS 70  BILITOT 0.3  PROT 8.6*  ALBUMIN 2.9*   No results found for this basename: LIPASE, AMYLASE,  in the last 168 hours No results found for this basename: AMMONIA,  in the last 168 hours CBC:  Recent Labs Lab 06/09/14 1021 06/09/14 1859  06/10/14 0543 06/11/14 0505 06/12/14 0503  WBC 6.0 5.7 5.8 5.3 5.3  NEUTROABS 4.6  --   --   --   --   HGB 5.5* 8.0* 8.2* 8.4* 9.1*  HCT 18.1* 24.7* 25.2* 26.1* 28.5*  MCV 88.3 87.0 85.1 85.9 87.2  PLT 382 324 328 346 379   Cardiac  Enzymes:  Recent Labs Lab 06/09/14 1021 06/11/14 0505  TROPONINI 0.48* <0.30   BNP: BNP (last 3 results)  Recent Labs  03/15/14 1645 04/27/14 1536 06/09/14 1021  PROBNP 37134.0* 51631.0* 30358.0*   CBG:  Recent Labs Lab 06/10/14 2125 06/11/14 0800 06/11/14 1133 06/11/14 1616 06/12/14 0809  GLUCAP 194* 151* 213* 156* 157*       Signed:  Corah Willeford  [facility] 06/12/2014, 10:39 AM

## 2014-06-12 NOTE — Progress Notes (Signed)
UR chart review completed.  

## 2014-06-12 NOTE — Care Management Note (Signed)
    Page 1 of 1   06/12/2014     11:50:17 AM CARE MANAGEMENT NOTE 06/12/2014  Patient:  [name], [name]   Account Number:  [id number]  Date Initiated:  06/12/2014  Documentation initiated by:  Theophilus Kinds  Subjective/Objective Assessment:   Pt admitted from home with anemia. Pt lives with her daughter [name] and will return home at discharge. Pt has PCS aide M-F, 2 hours a day. Pt has a hospital bed, and walker for home use.     Action/Plan:   No CM needs noted. Per pts daughter, no other D/C needs noted.   Anticipated DC Date:  06/12/2014   Anticipated DC Plan:  [place]  CM consult      Choice offered to / List presented to:             Status of service:  Completed, signed off Medicare Important Message given?  YES (If response is "NO", the following Medicare IM given date fields will be blank) Date Medicare IM given:  06/12/2014 Medicare IM given by:  Theophilus Kinds Date Additional Medicare IM given:   Additional Medicare IM given by:    Discharge Disposition:  HOME/SELF CARE  Per UR Regulation:    If discussed at Long Length of Stay Meetings, dates discussed:    Comments:  06/12/14 [place], RN BSN CM

## 2014-06-12 NOTE — Progress Notes (Signed)
    Subjective: Pleasantly confused. No overt GI bleeding. No GI complaints.   Objective: Vital signs in last 24 hours: Temp:  [97.5 F (36.4 C)-98.4 F (36.9 C)] 97.7 F (36.5 C) (07/20 0400) Pulse Rate:  [64-73] 68 (07/20 0400) Resp:  [14-20] 15 (07/20 0400) BP: (122-153)/(50-69) 140/59 mmHg (07/20 0400) SpO2:  [96 %-100 %] 97 % (07/20 0400) Weight:  [133 lb 6.1 oz (60.5 kg)] 133 lb 6.1 oz (60.5 kg) (07/20 0500) Last BM Date: 06/10/14 General:   Alert and oriented to person and place only.  Head:  Normocephalic and atraumatic. Eyes:  No icterus, sclera clear. Conjuctiva pink.  Abdomen:  Bowel sounds present, soft, non-tender, non-distended. No HSM or hernias noted.  Extremities:  Without  edema. Neurologic:  Alert and  oriented to person and place only  Intake/Output from previous day: 07/19 0701 - 07/20 0700 In: 1083 [P.O.:1080; I.V.:3] Out: 2350 [Urine:2350] Intake/Output this shift:    Lab Results:  Recent Labs  06/10/14 0543 06/11/14 0505 06/12/14 0503  WBC 5.8 5.3 5.3  HGB 8.2* 8.4* 9.1*  HCT 25.2* 26.1* 28.5*  PLT 328 346 379   BMET  Recent Labs  06/09/14 1021 06/10/14 0543 06/11/14 0505  NA 139 140 139  K 4.4 3.9 3.7  CL 106 105 104  CO2 19 21 23   GLUCOSE 229* 170* 159*  BUN 82* 75* 67*  CREATININE 2.96* 2.71* 2.34*  CALCIUM 8.7 8.8 9.0   LFT  Recent Labs  06/09/14 1021  PROT 8.6*  ALBUMIN 2.9*  AST 22  ALT 8  ALKPHOS 70  BILITOT 0.3   PT/INR  Recent Labs  06/09/14 1021  LABPROT 15.1  INR 1.19    Assessment: 73 year old female admitted with acute on chronic anemia, known multifactorial anemia with IDA/chronic disease component, NSTEMI this admission. Improvement with 2 units PRBCs and hemoccult negative. EGD performed in Feb 2015 with H.pylori gastritis s/p Prevpac treatment. Ultimately needs colonoscopy as outpatient. Stable from a GI standpoint without any overt signs of GI bleeding. Will likely be discharged today.     Plan: PPI Return in 4 weeks as outpatient to discuss diagnostic colonoscopy Signing off   Orvil Feil, ANP-BC [facility]    LOS: 3 days    06/12/2014, 8:45 AM

## 2014-06-12 NOTE — Progress Notes (Signed)
Pt resting in bed. A/ox1. Saline lock removed. Up with assistance. Denies any distress. Discharge instructions given and discussed with pt daughter, daughter verbalized understanding of instructions. Pt left floor via wheelchair with nursing staff and family.

## 2014-06-12 NOTE — Progress Notes (Signed)
REVIEWED.  

## 2014-06-12 NOTE — Discharge Instructions (Signed)
Anemia, Nonspecific Anemia is a condition in which the concentration of red blood cells or hemoglobin in the blood is below normal. Hemoglobin is a substance in red blood cells that carries oxygen to the tissues of the body. Anemia results in not enough oxygen reaching these tissues.  CAUSES  Common causes of anemia include:   Excessive bleeding. Bleeding may be internal or external. This includes excessive bleeding from periods (in women) or from the intestine.   Poor nutrition.   Chronic kidney, thyroid, and liver disease.  Bone marrow disorders that decrease red blood cell production.  Cancer and treatments for cancer.  HIV, AIDS, and their treatments.  Spleen problems that increase red blood cell destruction.  Blood disorders.  Excess destruction of red blood cells due to infection, medicines, and autoimmune disorders. SIGNS AND SYMPTOMS   Minor weakness.   Dizziness.   Headache.  Palpitations.   Shortness of breath, especially with exercise.   Paleness.  Cold sensitivity.  Indigestion.  Nausea.  Difficulty sleeping.  Difficulty concentrating. Symptoms may occur suddenly or they may develop slowly.  DIAGNOSIS  Additional blood tests are often needed. These help your health care provider determine the best treatment. Your health care provider will check your stool for blood and look for other causes of blood loss.  TREATMENT  Treatment varies depending on the cause of the anemia. Treatment can include:   Supplements of iron, vitamin B12, or folic acid.   Hormone medicines.   A blood transfusion. This may be needed if blood loss is severe.   Hospitalization. This may be needed if there is significant continual blood loss.   Dietary changes.  Spleen removal. HOME CARE INSTRUCTIONS Keep all follow-up appointments. It often takes many weeks to correct anemia, and having your health care provider check on your condition and your response to  treatment is very important. SEEK IMMEDIATE MEDICAL CARE IF:   You develop extreme weakness, shortness of breath, or chest pain.   You become dizzy or have trouble concentrating.  You develop heavy vaginal bleeding.   You develop a rash.   You have bloody or black, tarry stools.   You faint.   You vomit up blood.   You vomit repeatedly.   You have abdominal pain.  You have a fever or persistent symptoms for more than 2-3 days.   You have a fever and your symptoms suddenly get worse.   You are dehydrated.  MAKE SURE YOU:  Understand these instructions.  Will watch your condition.  Will get help right away if you are not doing well or get worse. Document Released: 12/18/2004 Document Revised: 07/13/2013 Document Reviewed: 05/06/2013 [employer] Patient Information 2015 [employer]. This information is not intended to replace advice given to you by your health care provider. Make sure you discuss any questions you have with your health care provider.  

## 2014-06-16 ENCOUNTER — Ambulatory Visit (INDEPENDENT_AMBULATORY_CARE_PROVIDER_SITE_OTHER): Payer: Medicare Other | Admitting: Cardiology

## 2014-06-16 ENCOUNTER — Encounter: Payer: Self-pay | Admitting: Cardiology

## 2014-06-16 VITALS — BP 123/65 | HR 77 | Ht 65.0 in | Wt 131.0 lb

## 2014-06-16 DIAGNOSIS — I2510 Atherosclerotic heart disease of native coronary artery without angina pectoris: Secondary | ICD-10-CM

## 2014-06-16 DIAGNOSIS — I059 Rheumatic mitral valve disease, unspecified: Secondary | ICD-10-CM

## 2014-06-16 DIAGNOSIS — I340 Nonrheumatic mitral (valve) insufficiency: Secondary | ICD-10-CM

## 2014-06-16 DIAGNOSIS — I5042 Chronic combined systolic (congestive) and diastolic (congestive) heart failure: Secondary | ICD-10-CM

## 2014-06-16 DIAGNOSIS — I509 Heart failure, unspecified: Secondary | ICD-10-CM

## 2014-06-16 NOTE — Progress Notes (Signed)
Clinical Summary [name] is a 73 y.o.female seen today for follow up of the following medical problems.   1. Chronic systolic/diastolic heart failure  - echo 12/2013 LVEF 76-16%, grade I diastolic dysfunction  - denies any DOE, no LE edema  2. Anemia  - recent admit 12/2013 with generalized weakness, Hgb ws 4.8.  - transfused 3 units of pRBCs, inpatient EGD without clear source of bleed. She was diagnosed with H.pylori gastritis but per notes not thought to have caused this  - repeat admit 05/2014 with symptomatic anemia, Hgb 5.5, transfused 2 units of pRBCs. Restarted on aggrenox at discharge. Followed by GI  3. CAD  - prior CABG in 2002, LVEF has been stable at 40-45% between 2013 and recent study 12/2013  - mild troponin elevated at 0.76 during recent admit 12/2013 thought to be demand ischemia, had been on antiplatelet at home.  - repeat mild trop elevation 05/2014 with repeat admission with severe anemia. Transient elevation of trop to 0.48.  4. Severe MR  - functional MR by echo 12/2013, poor candidate at this time for consideration for surgery due to overall comorbidities including dementia.  - denies any new symptoms   5. Dementia  - followed by primary  - reports she lives with daughter who helps with her care and making sure she takes her medicines  Past Medical History  Diagnosis Date  . Type 2 diabetes mellitus   . Essential hypertension, benign   . History of stroke     Previously on Coumadin  . MI, old     Reported 24  . Seizure disorder   . Coronary atherosclerosis of native coronary artery     Multivessel status post CABG 2002  . History of GI bleed     Erosive gastritis and duodenitis by EGD 2002  . Mixed hyperlipidemia   . Ischemic cardiomyopathy     LVEF 40-45% March 2013  . Dementia   . CKD (chronic kidney disease) stage 4, GFR 15-29 ml/min   . Stroke   . Seizures      No Known Allergies   Current Outpatient Prescriptions  Medication Sig  Dispense Refill  . calcitRIOL (ROCALTROL) 0.25 MCG capsule Take 1 capsule (0.25 mcg total) by mouth daily.  31 capsule  0  . dipyridamole-aspirin (AGGRENOX) 200-25 MG per 12 hr capsule Take 1 capsule by mouth 2 (two) times daily.      Marland Kitchen donepezil (ARICEPT) 10 MG tablet Take 10 mg by mouth at bedtime.       . folic acid (FOLVITE) 1 MG tablet Take 1 mg by mouth every morning.       . hydrALAZINE (APRESOLINE) 25 MG tablet Take 75 mg by mouth every 8 (eight) hours.      . insulin detemir (LEVEMIR) 100 UNIT/ML injection Inject 0.08 mLs (8 Units total) into the skin at bedtime.  10 mL  0  . isosorbide mononitrate (IMDUR) 30 MG 24 hr tablet Take 0.5 mg by mouth every morning.      . levETIRAcetam (KEPPRA) 100 MG/ML solution Take 5 mLs by mouth 2 (two) times daily.      Marland Kitchen linagliptin (TRADJENTA) 5 MG TABS tablet Take 5 mg by mouth every morning.       . metoprolol succinate (TOPROL-XL) 100 MG 24 hr tablet Take 100 mg by mouth every morning. Take with or immediately following a meal.      . pantoprazole (PROTONIX) 40 MG tablet Take 40 mg  by mouth every morning.      . potassium chloride (MICRO-K) 10 MEQ CR capsule Take 10-20 mEq by mouth See admin instructions. Alternate taking one Potassium daily with taking one Potassium twice daily with Torsemide as directed      . pravastatin (PRAVACHOL) 20 MG tablet Take 20 mg by mouth at bedtime.       . sodium bicarbonate 650 MG tablet Take 650 mg by mouth 3 (three) times daily.      Marland Kitchen torsemide (DEMADEX) 100 MG tablet Take 0.5 tablets (50 mg total) by mouth daily.  30 tablet  0  . Vitamin D, Ergocalciferol, (DRISDOL) 50000 UNITS CAPS Take 50,000 Units by mouth every 30 (thirty) days. Takes on Tuesday       No current facility-administered medications for this visit.     Past Surgical History  Procedure Laterality Date  . Coronary artery bypass graft  July 2002    LIMA to LAD, SVG to OM1, SVG to OM 2, SVG to RCA  . Tee without cardioversion  01/28/2012     Procedure: TRANSESOPHAGEAL ECHOCARDIOGRAM (TEE);  Surgeon: Lelon Perla, MD;  Location: [facility] ENDOSCOPY;  Service: Cardiovascular;  Laterality: [name];  . Colonoscopy  2002  . Esophagogastroduodenoscopy  2002  . Esophagogastroduodenoscopy [name] 01/18/2014    SWF:UXNAT hiatal hernia. Abnormal gastric and duodenal bulbar mucosa of uncertain significance - status post gastric bx (chronic gastritis/H.pylori +     No Known Allergies    Family History  Problem Relation Age of Onset  . Stroke Father   . Diabetes type II Mother   . Colon cancer Neg Hx      Social History [name] reports that she has quit smoking. Her smoking use included Cigarettes. She has a 50 pack-year smoking history. She does not have any smokeless tobacco history on file. [name] reports that she does not drink alcohol.   Review of Systems CONSTITUTIONAL: No weight loss, fever, chills, weakness or fatigue.  HEENT: Eyes: No visual loss, blurred vision, double vision or yellow sclerae.No hearing loss, sneezing, congestion, runny nose or sore throat.  SKIN: No rash or itching.  CARDIOVASCULAR: per HPI RESPIRATORY: No shortness of breath, cough or sputum.  GASTROINTESTINAL: No anorexia, nausea, vomiting or diarrhea. No abdominal pain or blood.  GENITOURINARY: No burning on urination, no polyuria NEUROLOGICAL: No headache, dizziness, syncope, paralysis, ataxia, numbness or tingling in the extremities. No change in bowel or bladder control.  MUSCULOSKELETAL: No muscle, back pain, joint pain or stiffness.  LYMPHATICS: No enlarged nodes. No history of splenectomy.  PSYCHIATRIC: No history of depression or anxiety.  ENDOCRINOLOGIC: No reports of sweating, cold or heat intolerance. No polyuria or polydipsia.  Marland Kitchen   Physical Examination There were no vitals filed for this visit. There were no vitals filed for this visit.  Gen: resting comfortably, no acute distress HEENT: no scleral icterus, pupils equal round and  reactive, no palptable cervical adenopathy,  CV Resp: Clear to auscultation bilaterally GI: abdomen is soft, non-tender, non-distended, normal bowel sounds, no hepatosplenomegaly MSK: extremities are warm, no edema.  Skin: warm, no rash Neuro:  no focal deficits Psych: appropriate affect     Assessment and Plan  1. Chronic combined systolic/diastolc HF - appears euvolemic today - continue current meds  2. CAD - prior NSTEMIs in setting of severe symptomatic anemia, managed medically - continue current medical therapy, no indication for ischemic testing at this time. Given her dementia and advanced multiple comorbidities would be hesitant to  pursue invasive testing at any point  3. Mitral regurgitation - no signficant symptoms - poor candidate for intervention, continue symptoms management with diuretics   F/u  3 months.       Arnoldo Lenis, M.D., F.A.C.C.

## 2014-06-16 NOTE — Patient Instructions (Signed)
Your physician recommends that you schedule a follow-up appointment in: 3 months. Your physician recommends that you continue on your current medications as directed. Please refer to the Current Medication list given to you today. 

## 2014-06-22 ENCOUNTER — Inpatient Hospital Stay (HOSPITAL_COMMUNITY)
Admission: EM | Admit: 2014-06-22 | Discharge: 2014-06-26 | DRG: 378 | Disposition: A | Discharge status: Home Health | Payer: Medicare Other | Attending: Internal Medicine | Admitting: Internal Medicine

## 2014-06-22 ENCOUNTER — Encounter (HOSPITAL_COMMUNITY): Payer: Self-pay | Admitting: Emergency Medicine

## 2014-06-22 DIAGNOSIS — I509 Heart failure, unspecified: Secondary | ICD-10-CM | POA: Diagnosis present

## 2014-06-22 DIAGNOSIS — E782 Mixed hyperlipidemia: Secondary | ICD-10-CM | POA: Diagnosis present

## 2014-06-22 DIAGNOSIS — I129 Hypertensive chronic kidney disease with stage 1 through stage 4 chronic kidney disease, or unspecified chronic kidney disease: Secondary | ICD-10-CM | POA: Diagnosis present

## 2014-06-22 DIAGNOSIS — G40909 Epilepsy, unspecified, not intractable, without status epilepticus: Secondary | ICD-10-CM

## 2014-06-22 DIAGNOSIS — Z823 Family history of stroke: Secondary | ICD-10-CM | POA: Diagnosis not present

## 2014-06-22 DIAGNOSIS — I214 Non-ST elevation (NSTEMI) myocardial infarction: Secondary | ICD-10-CM

## 2014-06-22 DIAGNOSIS — Z951 Presence of aortocoronary bypass graft: Secondary | ICD-10-CM | POA: Diagnosis not present

## 2014-06-22 DIAGNOSIS — Z8673 Personal history of transient ischemic attack (TIA), and cerebral infarction without residual deficits: Secondary | ICD-10-CM

## 2014-06-22 DIAGNOSIS — I5032 Chronic diastolic (congestive) heart failure: Secondary | ICD-10-CM | POA: Diagnosis present

## 2014-06-22 DIAGNOSIS — I2589 Other forms of chronic ischemic heart disease: Secondary | ICD-10-CM | POA: Diagnosis present

## 2014-06-22 DIAGNOSIS — Z794 Long term (current) use of insulin: Secondary | ICD-10-CM | POA: Diagnosis not present

## 2014-06-22 DIAGNOSIS — E119 Type 2 diabetes mellitus without complications: Secondary | ICD-10-CM | POA: Diagnosis present

## 2014-06-22 DIAGNOSIS — F0390 Unspecified dementia without behavioral disturbance: Secondary | ICD-10-CM | POA: Diagnosis present

## 2014-06-22 DIAGNOSIS — R531 Weakness: Secondary | ICD-10-CM

## 2014-06-22 DIAGNOSIS — N179 Acute kidney failure, unspecified: Secondary | ICD-10-CM

## 2014-06-22 DIAGNOSIS — D500 Iron deficiency anemia secondary to blood loss (chronic): Secondary | ICD-10-CM

## 2014-06-22 DIAGNOSIS — I5043 Acute on chronic combined systolic (congestive) and diastolic (congestive) heart failure: Secondary | ICD-10-CM

## 2014-06-22 DIAGNOSIS — J189 Pneumonia, unspecified organism: Secondary | ICD-10-CM

## 2014-06-22 DIAGNOSIS — Z9119 Patient's noncompliance with other medical treatment and regimen: Secondary | ICD-10-CM

## 2014-06-22 DIAGNOSIS — E860 Dehydration: Secondary | ICD-10-CM

## 2014-06-22 DIAGNOSIS — R0989 Other specified symptoms and signs involving the circulatory and respiratory systems: Secondary | ICD-10-CM

## 2014-06-22 DIAGNOSIS — I639 Cerebral infarction, unspecified: Secondary | ICD-10-CM

## 2014-06-22 DIAGNOSIS — N183 Chronic kidney disease, stage 3 unspecified: Secondary | ICD-10-CM

## 2014-06-22 DIAGNOSIS — I2510 Atherosclerotic heart disease of native coronary artery without angina pectoris: Secondary | ICD-10-CM | POA: Diagnosis present

## 2014-06-22 DIAGNOSIS — I5022 Chronic systolic (congestive) heart failure: Secondary | ICD-10-CM

## 2014-06-22 DIAGNOSIS — R739 Hyperglycemia, unspecified: Secondary | ICD-10-CM

## 2014-06-22 DIAGNOSIS — R5383 Other fatigue: Secondary | ICD-10-CM | POA: Diagnosis not present

## 2014-06-22 DIAGNOSIS — Z91199 Patient's noncompliance with other medical treatment and regimen due to unspecified reason: Secondary | ICD-10-CM

## 2014-06-22 DIAGNOSIS — D126 Benign neoplasm of colon, unspecified: Secondary | ICD-10-CM | POA: Diagnosis present

## 2014-06-22 DIAGNOSIS — N189 Chronic kidney disease, unspecified: Secondary | ICD-10-CM

## 2014-06-22 DIAGNOSIS — E162 Hypoglycemia, unspecified: Secondary | ICD-10-CM

## 2014-06-22 DIAGNOSIS — K5730 Diverticulosis of large intestine without perforation or abscess without bleeding: Secondary | ICD-10-CM | POA: Diagnosis present

## 2014-06-22 DIAGNOSIS — I340 Nonrheumatic mitral (valve) insufficiency: Secondary | ICD-10-CM

## 2014-06-22 DIAGNOSIS — Z833 Family history of diabetes mellitus: Secondary | ICD-10-CM | POA: Diagnosis not present

## 2014-06-22 DIAGNOSIS — N184 Chronic kidney disease, stage 4 (severe): Secondary | ICD-10-CM

## 2014-06-22 DIAGNOSIS — Z87891 Personal history of nicotine dependence: Secondary | ICD-10-CM | POA: Diagnosis not present

## 2014-06-22 DIAGNOSIS — D509 Iron deficiency anemia, unspecified: Secondary | ICD-10-CM

## 2014-06-22 DIAGNOSIS — K2971 Gastritis, unspecified, with bleeding: Secondary | ICD-10-CM

## 2014-06-22 DIAGNOSIS — R197 Diarrhea, unspecified: Secondary | ICD-10-CM

## 2014-06-22 DIAGNOSIS — D62 Acute posthemorrhagic anemia: Secondary | ICD-10-CM

## 2014-06-22 DIAGNOSIS — I10 Essential (primary) hypertension: Secondary | ICD-10-CM | POA: Diagnosis present

## 2014-06-22 DIAGNOSIS — K922 Gastrointestinal hemorrhage, unspecified: Secondary | ICD-10-CM | POA: Diagnosis present

## 2014-06-22 DIAGNOSIS — R600 Localized edema: Secondary | ICD-10-CM

## 2014-06-22 DIAGNOSIS — R5381 Other malaise: Secondary | ICD-10-CM | POA: Diagnosis present

## 2014-06-22 DIAGNOSIS — I5030 Unspecified diastolic (congestive) heart failure: Secondary | ICD-10-CM

## 2014-06-22 DIAGNOSIS — F0391 Unspecified dementia with behavioral disturbance: Secondary | ICD-10-CM

## 2014-06-22 DIAGNOSIS — K2991 Gastroduodenitis, unspecified, with bleeding: Secondary | ICD-10-CM

## 2014-06-22 HISTORY — DX: Heart failure, unspecified: I50.9

## 2014-06-22 LAB — CBC WITH DIFFERENTIAL/PLATELET
Basophils Absolute: 0 10*3/uL (ref 0.0–0.1)
Basophils Relative: 1 % (ref 0–1)
EOS ABS: 0.1 10*3/uL (ref 0.0–0.7)
EOS PCT: 1 % (ref 0–5)
HEMATOCRIT: 20.3 % — AB (ref 36.0–46.0)
HEMOGLOBIN: 6.4 g/dL — AB (ref 12.0–15.0)
LYMPHS ABS: 1 10*3/uL (ref 0.7–4.0)
Lymphocytes Relative: 23 % (ref 12–46)
MCH: 27.6 pg (ref 26.0–34.0)
MCHC: 31.5 g/dL (ref 30.0–36.0)
MCV: 87.5 fL (ref 78.0–100.0)
MONO ABS: 0.4 10*3/uL (ref 0.1–1.0)
Monocytes Relative: 9 % (ref 3–12)
Neutro Abs: 2.8 10*3/uL (ref 1.7–7.7)
Neutrophils Relative %: 67 % (ref 43–77)
Platelets: 237 10*3/uL (ref 150–400)
RBC: 2.32 MIL/uL — AB (ref 3.87–5.11)
RDW: 17.5 % — ABNORMAL HIGH (ref 11.5–15.5)
WBC: 4.2 10*3/uL (ref 4.0–10.5)

## 2014-06-22 LAB — BASIC METABOLIC PANEL
Anion gap: 11 (ref 5–15)
BUN: 57 mg/dL — ABNORMAL HIGH (ref 6–23)
CALCIUM: 8.6 mg/dL (ref 8.4–10.5)
CO2: 25 mEq/L (ref 19–32)
Chloride: 105 mEq/L (ref 96–112)
Creatinine, Ser: 2.52 mg/dL — ABNORMAL HIGH (ref 0.50–1.10)
GFR calc Af Amer: 21 mL/min — ABNORMAL LOW (ref >90)
GFR, EST NON AFRICAN AMERICAN: 18 mL/min — AB (ref >90)
Glucose, Bld: 149 mg/dL — ABNORMAL HIGH (ref 70–99)
Potassium: 4.3 mEq/L (ref 3.7–5.3)
Sodium: 141 mEq/L (ref 137–147)

## 2014-06-22 LAB — GLUCOSE, CAPILLARY: GLUCOSE-CAPILLARY: 123 mg/dL — AB (ref 70–99)

## 2014-06-22 LAB — PREPARE RBC (CROSSMATCH)

## 2014-06-22 MED ORDER — INSULIN ASPART 100 UNIT/ML ~~LOC~~ SOLN: 0.0000 [IU] | Freq: Every day | SUBCUTANEOUS | Status: DC

## 2014-06-22 MED ORDER — HYDRALAZINE HCL 25 MG PO TABS
75.0000 mg | ORAL_TABLET | Freq: Three times a day (TID) | ORAL | Status: DC
  Administered 2014-06-22 – 2014-06-26 (×10): 75 mg via ORAL
  Filled 2014-06-22 (×10): qty 3

## 2014-06-22 MED ORDER — METOPROLOL SUCCINATE ER 50 MG PO TB24
100.0000 mg | ORAL_TABLET | Freq: Every morning | ORAL | Status: DC
  Administered 2014-06-23 – 2014-06-26 (×4): 100 mg via ORAL
  Filled 2014-06-22 (×4): qty 2

## 2014-06-22 MED ORDER — FUROSEMIDE 10 MG/ML IJ SOLN
20.0000 mg | Freq: Two times a day (BID) | INTRAMUSCULAR | Status: AC
  Administered 2014-06-22 – 2014-06-23 (×2): 20 mg via INTRAVENOUS
  Filled 2014-06-22 (×2): qty 2

## 2014-06-22 MED ORDER — INSULIN ASPART 100 UNIT/ML ~~LOC~~ SOLN
0.0000 [IU] | Freq: Three times a day (TID) | SUBCUTANEOUS | Status: DC
  Administered 2014-06-23: 2 [IU] via SUBCUTANEOUS
  Administered 2014-06-23 – 2014-06-24 (×3): 1 [IU] via SUBCUTANEOUS
  Administered 2014-06-25: 2 [IU] via SUBCUTANEOUS
  Administered 2014-06-26: 1 [IU] via SUBCUTANEOUS

## 2014-06-22 MED ORDER — SODIUM BICARBONATE 650 MG PO TABS
650.0000 mg | ORAL_TABLET | Freq: Three times a day (TID) | ORAL | Status: DC
  Administered 2014-06-22 – 2014-06-26 (×10): 650 mg via ORAL
  Filled 2014-06-22 (×10): qty 1

## 2014-06-22 MED ORDER — PANTOPRAZOLE SODIUM 40 MG IV SOLR
40.0000 mg | Freq: Every day | INTRAVENOUS | Status: DC
  Administered 2014-06-22: 40 mg via INTRAVENOUS
  Filled 2014-06-22: qty 40

## 2014-06-22 MED ORDER — POTASSIUM CHLORIDE CRYS ER 10 MEQ PO TBCR
10.0000 meq | EXTENDED_RELEASE_TABLET | Freq: Every day | ORAL | Status: DC
  Administered 2014-06-23 – 2014-06-26 (×4): 10 meq via ORAL
  Filled 2014-06-22 (×4): qty 1

## 2014-06-22 MED ORDER — FOLIC ACID 1 MG PO TABS
1.0000 mg | ORAL_TABLET | Freq: Every morning | ORAL | Status: DC
  Administered 2014-06-23 – 2014-06-26 (×4): 1 mg via ORAL
  Filled 2014-06-22 (×4): qty 1

## 2014-06-22 MED ORDER — SODIUM CHLORIDE 0.9 % IJ SOLN
3.0000 mL | Freq: Two times a day (BID) | INTRAMUSCULAR | Status: DC
  Administered 2014-06-22 – 2014-06-26 (×5): 3 mL via INTRAVENOUS

## 2014-06-22 MED ORDER — SODIUM CHLORIDE 0.9 % IV SOLN: 250.0000 mL | INTRAVENOUS | Status: DC | PRN

## 2014-06-22 MED ORDER — ONDANSETRON HCL 4 MG PO TABS: 4.0000 mg | ORAL_TABLET | Freq: Four times a day (QID) | ORAL | Status: DC | PRN

## 2014-06-22 MED ORDER — SODIUM CHLORIDE 0.9 % IJ SOLN: 3.0000 mL | INTRAMUSCULAR | Status: DC | PRN

## 2014-06-22 MED ORDER — ACETAMINOPHEN 325 MG PO TABS: 650.0000 mg | ORAL_TABLET | Freq: Four times a day (QID) | ORAL | Status: DC | PRN

## 2014-06-22 MED ORDER — VITAMIN D (ERGOCALCIFEROL) 1.25 MG (50000 UNIT) PO CAPS
50000.0000 [IU] | ORAL_CAPSULE | ORAL | Status: DC
  Filled 2014-06-22: qty 1

## 2014-06-22 MED ORDER — ISOSORBIDE MONONITRATE ER 30 MG PO TB24
15.0000 mg | ORAL_TABLET | Freq: Every morning | ORAL | Status: DC
  Administered 2014-06-23 – 2014-06-26 (×4): 15 mg via ORAL
  Filled 2014-06-22 (×4): qty 1

## 2014-06-22 MED ORDER — LEVETIRACETAM 100 MG/ML PO SOLN
500.0000 mg | Freq: Two times a day (BID) | ORAL | Status: DC
  Administered 2014-06-22 – 2014-06-26 (×7): 500 mg via ORAL
  Filled 2014-06-22 (×12): qty 5

## 2014-06-22 MED ORDER — DONEPEZIL HCL 5 MG PO TABS
10.0000 mg | ORAL_TABLET | Freq: Every day | ORAL | Status: DC
  Administered 2014-06-22 – 2014-06-25 (×4): 10 mg via ORAL
  Filled 2014-06-22 (×4): qty 2

## 2014-06-22 MED ORDER — SIMVASTATIN 10 MG PO TABS
10.0000 mg | ORAL_TABLET | Freq: Every day | ORAL | Status: DC
Start: 2014-06-22 — End: 2014-06-26
  Administered 2014-06-22 – 2014-06-25 (×4): 10 mg via ORAL
  Filled 2014-06-22 (×5): qty 1

## 2014-06-22 MED ORDER — CALCITRIOL 0.25 MCG PO CAPS
0.2500 ug | ORAL_CAPSULE | Freq: Every day | ORAL | Status: DC
  Administered 2014-06-23 – 2014-06-26 (×4): 0.25 ug via ORAL
  Filled 2014-06-22 (×4): qty 1

## 2014-06-22 MED ORDER — ONDANSETRON HCL 4 MG/2ML IJ SOLN: 4.0000 mg | Freq: Four times a day (QID) | INTRAMUSCULAR | Status: DC | PRN

## 2014-06-22 MED ORDER — INSULIN DETEMIR 100 UNIT/ML ~~LOC~~ SOLN
8.0000 [IU] | Freq: Every day | SUBCUTANEOUS | Status: DC
  Administered 2014-06-22 – 2014-06-25 (×4): 8 [IU] via SUBCUTANEOUS
  Filled 2014-06-22 (×5): qty 0.08

## 2014-06-22 MED ORDER — ACETAMINOPHEN 650 MG RE SUPP: 650.0000 mg | Freq: Four times a day (QID) | RECTAL | Status: DC | PRN

## 2014-06-22 NOTE — H&P (Signed)
PCP:   [name], MD   Chief Complaint:  Anemia  HPI:  73 year old female who  has a past medical history of Type 2 diabetes mellitus; Essential hypertension, benign; History of stroke; MI, old; Seizure disorder; Coronary atherosclerosis of native coronary artery; History of GI bleed; Mixed hyperlipidemia; Ischemic cardiomyopathy; Dementia; Stroke; Seizures; CKD (chronic kidney disease) stage 4, GFR 15-29 ml/min; and CHF (congestive heart failure). Patient was recently discharged from the hospital after she was given blood transfusion for the anemia. EGD was performed in February 2015 which showed H. pylori gastritis, patient was supposed to get colonoscopy as outpatient. She has been feeling generalized weakness, today she went to her primary M.D. there lab work showed that patient had anemia.  Patient is a poor historian, she denies chest pain no shortness of breath no nausea vomiting or diarrhea. She denies passing out. In the ED patient is found to have hemoglobin of 6.4, 1 unit PRBC has been ordered by the ED physician.  Allergies:  No Known Allergies    Past Medical History  Diagnosis Date  . Type 2 diabetes mellitus   . Essential hypertension, benign   . History of stroke     Previously on Coumadin  . MI, old     Reported 43  . Seizure disorder   . Coronary atherosclerosis of native coronary artery     Multivessel status post CABG 2002  . History of GI bleed     Erosive gastritis and duodenitis by EGD 2002  . Mixed hyperlipidemia   . Ischemic cardiomyopathy     LVEF 40-45% March 2013  . Dementia   . Stroke   . Seizures   . CKD (chronic kidney disease) stage 4, GFR 15-29 ml/min   . CHF (congestive heart failure)     Past Surgical History  Procedure Laterality Date  . Coronary artery bypass graft  July 2002    LIMA to LAD, SVG to OM1, SVG to OM 2, SVG to RCA  . Tee without cardioversion  01/28/2012    Procedure: TRANSESOPHAGEAL ECHOCARDIOGRAM (TEE);  Surgeon:  Lelon Perla, MD;  Location: [facility] ENDOSCOPY;  Service: Cardiovascular;  Laterality: [name];  . Colonoscopy  2002  . Esophagogastroduodenoscopy  2002  . Esophagogastroduodenoscopy [name] 01/18/2014    OZH:YQMVH hiatal hernia. Abnormal gastric and duodenal bulbar mucosa of uncertain significance - status post gastric bx (chronic gastritis/H.pylori +    Prior to Admission medications   Medication Sig Start Date End Date Taking? Authorizing Provider  calcitRIOL (ROCALTROL) 0.25 MCG capsule Take 1 capsule (0.25 mcg total) by mouth daily. 05/02/14  Yes Eugenie Filler, MD  dipyridamole-aspirin (AGGRENOX) 200-25 MG per 12 hr capsule Take 1 capsule by mouth 2 (two) times daily.   Yes Historical Provider, MD  donepezil (ARICEPT) 10 MG tablet Take 10 mg by mouth at bedtime.  01/30/14  Yes Historical Provider, MD  folic acid (FOLVITE) 1 MG tablet Take 1 mg by mouth every morning.    Yes Historical Provider, MD  hydrALAZINE (APRESOLINE) 25 MG tablet Take 75 mg by mouth every 8 (eight) hours.   Yes Historical Provider, MD  insulin detemir (LEVEMIR) 100 UNIT/ML injection Inject 0.08 mLs (8 Units total) into the skin at bedtime. 05/02/14  Yes Eugenie Filler, MD  isosorbide mononitrate (IMDUR) 30 MG 24 hr tablet Take 0.5 mg by mouth every morning.   Yes Historical Provider, MD  levETIRAcetam (KEPPRA) 100 MG/ML solution Take 5 mLs by mouth 2 (two) times daily.  01/13/14  Yes Historical Provider, MD  linagliptin (TRADJENTA) 5 MG TABS tablet Take 5 mg by mouth every morning.    Yes Historical Provider, MD  metoprolol succinate (TOPROL-XL) 100 MG 24 hr tablet Take 100 mg by mouth every morning. Take with or immediately following a meal.   Yes Historical Provider, MD  pantoprazole (PROTONIX) 40 MG tablet Take 40 mg by mouth every morning.   Yes Historical Provider, MD  potassium chloride (MICRO-K) 10 MEQ CR capsule Take 10-20 mEq by mouth See admin instructions. Alternate taking one Potassium daily with taking one Potassium  twice daily with Torsemide as directed   Yes Historical Provider, MD  pravastatin (PRAVACHOL) 20 MG tablet Take 20 mg by mouth at bedtime.    Yes Historical Provider, MD  sodium bicarbonate 650 MG tablet Take 650 mg by mouth 3 (three) times daily.   Yes Historical Provider, MD  torsemide (DEMADEX) 100 MG tablet Take 0.5 tablets (50 mg total) by mouth daily. 05/02/14  Yes Eugenie Filler, MD  Vitamin D, Ergocalciferol, (DRISDOL) 50000 UNITS CAPS Take 50,000 Units by mouth every 30 (thirty) days. Takes on Tuesday   Yes Historical Provider, MD    Social History:  reports that she has quit smoking. Her smoking use included Cigarettes. She has a 50 pack-year smoking history. She does not have any smokeless tobacco history on file. She reports that she does not drink alcohol or use illicit drugs.  Family History  Problem Relation Age of Onset  . Stroke Father   . Diabetes type II Mother   . Colon cancer Neg Hx      All the positives are listed in BOLD  Review of Systems:  HEENT: Headache, blurred vision, runny nose, sore throat Neck: Hypothyroidism, hyperthyroidism,,lymphadenopathy Chest : Shortness of breath, history of COPD, Asthma Heart : Chest pain, history of coronary arterey disease GI:  Nausea, vomiting, diarrhea, constipation, GERD GU: Dysuria, urgency, frequency of urination, hematuria Neuro: Stroke, seizures, syncope Psych: Depression, anxiety, hallucinations   Physical Exam: Blood pressure 154/68, pulse 77, temperature 97.8 F (36.6 C), temperature source Oral, resp. rate 21, SpO2 98.00%. Constitutional:   Patient is a well-developed and well-nourished *female in no acute distress and cooperative with exam. Head: Normocephalic and atraumatic Mouth: Mucus membranes moist Eyes: PERRL, EOMI, conjunctivae normal Neck: Supple, No Thyromegaly Cardiovascular: RRR, S1 normal, S2 normal Pulmonary/Chest: CTAB, no wheezes, rales, or rhonchi Abdominal: Soft. Non-tender,  non-distended, bowel sounds are normal, no masses, organomegaly, or guarding present.  Neurological: A&O x3, Strenght is normal and symmetric bilaterally, cranial nerve II-XII are grossly intact, no focal motor deficit, sensory intact to light touch bilaterally.  Extremities : No Cyanosis, Clubbing or Edema  Labs on Admission:  Basic Metabolic Panel:  Recent Labs Lab 06/22/14 1820  NA 141  K 4.3  CL 105  CO2 25  GLUCOSE 149*  BUN 57*  CREATININE 2.52*  CALCIUM 8.6   Liver Function Tests: No results found for this basename: AST, ALT, ALKPHOS, BILITOT, PROT, ALBUMIN,  in the last 168 hours No results found for this basename: LIPASE, AMYLASE,  in the last 168 hours No results found for this basename: AMMONIA,  in the last 168 hours CBC:  Recent Labs Lab 06/22/14 1820  WBC 4.2  NEUTROABS 2.8  HGB 6.4*  HCT 20.3*  MCV 87.5  PLT 237   Cardiac Enzymes: No results found for this basename: CKTOTAL, CKMB, CKMBINDEX, TROPONINI,  in the last 168 hours  BNP (last 3 results)  Recent Labs  03/15/14 1645 04/27/14 1536 06/09/14 1021  PROBNP 37134.0* 51631.0* 30358.0*       Assessment/Plan Principal Problem:   Anemia Active Problems:   Hypertension   Diabetes mellitus   CKD (chronic kidney disease) stage 3, GFR 30-59 ml/min   Acute on chronic renal failure   GI bleeding  Anemia Likely GI bleeding, ? Source. Will transfuse 1 unit of PRBC, check CBC in a.m.  ? GI bleed Patient has been seen by GI in the past, the plan was to get colonoscopy as outpatient.  we'll keep the patient n.p.o. for possible colonoscopy in a.m. Patient has been taking Aggrenox at home, I will hold it at this time IV Protonix 40 mg daily  GI consultation in the a.m.  History of diastolic heart failure Patient had an echocardiogram done on 06/10/2014 which showed EF in the range of 14-43%, grade 2 diastolic dysfunction. Patient has been on Demadex at home, she is not displaying any symptoms  and signs of CHF exacerbation at this time Since she is getting blood transfusion, will give Lasix 20 mg IV every 12 hours x2 doses. Further need for Lasix as per patient's clinical course over next 24-48 hours  Diabetes mellitus Will continue patient's levemir 8 units subcutaneously at bedtime We'll also initiate sliding scale insulin with NovoLog  Seizure disorder Continue Keppra  CKD stage IV Patient's baseline creatinine is around 2.5-3. Today creatinine is 2.52, at baseline We'll monitor patient's BMP in a.m. since we're giving the Lasix for CHF.  Hypertension Continue hydralazine 75 mg every 8 hours Continue metoprolol 100 mg daily  Dementia Continue Aricept  DVT prophylaxis SCDs  Code status: Patient is full code  Family discussion: Admission, patients condition and plan of care including tests being ordered have been discussed with the patient and her daughter Zenita Kister who indicate understanding and agree with the plan and Code Status.   Time Spent on Admission: 65 minutes [place] [facility] Pager: [phone] 06/22/2014, 8:53 PM  If 7PM-7AM, please contact night-coverage  [email or web address]  Password [email or web address]

## 2014-06-22 NOTE — ED Notes (Signed)
CRITICAL VALUE ALERT  Critical value received:  Hemoglobin - 6.4  Date of notification:  06/22/2014  Time of notification:  1908  Critical value read back: yes  Nurse who received alert:  [id number]  MD notified (1st page):  Dr Dewayne Hatch  Time of first page:  1908  MD notified (2nd page):  Time of second page:  Responding MD:  Dr Dewayne Hatch  Time MD responded:  [id number]

## 2014-06-22 NOTE — ED Provider Notes (Signed)
CSN: [id number]     Arrival date & time 06/22/14  1701 History   First MD Initiated Contact with Patient 06/22/14 1721     No chief complaint on file.    (Consider location/radiation/quality/duration/timing/severity/associated sxs/prior Treatment) Patient is a 73 y.o. female presenting with weakness. The history is provided by a relative (states the pt went to her md yesterday and had low blood.  she got blood in the hospital a couple weeks ago).  Weakness This is a new problem. The current episode started 2 days ago. The problem occurs constantly. The problem has not changed since onset.Pertinent negatives include no chest pain, no abdominal pain and no headaches. Nothing aggravates the symptoms. Nothing relieves the symptoms.    Past Medical History  Diagnosis Date  . Type 2 diabetes mellitus   . Essential hypertension, benign   . History of stroke     Previously on Coumadin  . MI, old     Reported 46  . Seizure disorder   . Coronary atherosclerosis of native coronary artery     Multivessel status post CABG 2002  . History of GI bleed     Erosive gastritis and duodenitis by EGD 2002  . Mixed hyperlipidemia   . Ischemic cardiomyopathy     LVEF 40-45% March 2013  . Dementia   . Stroke   . Seizures   . CKD (chronic kidney disease) stage 4, GFR 15-29 ml/min   . CHF (congestive heart failure)    Past Surgical History  Procedure Laterality Date  . Coronary artery bypass graft  July 2002    LIMA to LAD, SVG to OM1, SVG to OM 2, SVG to RCA  . Tee without cardioversion  01/28/2012    Procedure: TRANSESOPHAGEAL ECHOCARDIOGRAM (TEE);  Surgeon: Lelon Perla, MD;  Location: [facility] ENDOSCOPY;  Service: Cardiovascular;  Laterality: [name];  . Colonoscopy  2002  . Esophagogastroduodenoscopy  2002  . Esophagogastroduodenoscopy [name] 01/18/2014    GEX:BMWUX hiatal hernia. Abnormal gastric and duodenal bulbar mucosa of uncertain significance - status post gastric bx (chronic gastritis/H.pylori +    Family History  Problem Relation Age of Onset  . Stroke Father   . Diabetes type II Mother   . Colon cancer Neg Hx    History  Substance Use Topics  . Smoking status: Former Smoker -- 1.00 packs/day for 50 years    Types: Cigarettes  . Smokeless tobacco: Not on file  . Alcohol Use: No   OB History   Grav Para Term Preterm Abortions TAB SAB Ect Mult Living                 Review of Systems  Constitutional: Positive for fatigue. Negative for appetite change.  HENT: Negative for congestion, ear discharge and sinus pressure.   Eyes: Negative for discharge.  Respiratory: Negative for cough.   Cardiovascular: Negative for chest pain.  Gastrointestinal: Negative for abdominal pain and diarrhea.  Genitourinary: Negative for frequency and hematuria.  Musculoskeletal: Negative for back pain.  Skin: Negative for rash.  Neurological: Positive for weakness. Negative for seizures and headaches.  Psychiatric/Behavioral: Negative for hallucinations.      Allergies  Review of patient's allergies indicates no known allergies.  Home Medications   Prior to Admission medications   Medication Sig Start Date End Date Taking? Authorizing Provider  calcitRIOL (ROCALTROL) 0.25 MCG capsule Take 1 capsule (0.25 mcg total) by mouth daily. 05/02/14  Yes Eugenie Filler, MD  dipyridamole-aspirin (AGGRENOX) 200-25 MG per 12 hr  capsule Take 1 capsule by mouth 2 (two) times daily.   Yes Historical Provider, MD  donepezil (ARICEPT) 10 MG tablet Take 10 mg by mouth at bedtime.  01/30/14  Yes Historical Provider, MD  folic acid (FOLVITE) 1 MG tablet Take 1 mg by mouth every morning.    Yes Historical Provider, MD  hydrALAZINE (APRESOLINE) 25 MG tablet Take 75 mg by mouth every 8 (eight) hours.   Yes Historical Provider, MD  insulin detemir (LEVEMIR) 100 UNIT/ML injection Inject 0.08 mLs (8 Units total) into the skin at bedtime. 05/02/14  Yes Eugenie Filler, MD  isosorbide mononitrate (IMDUR) 30 MG 24 hr  tablet Take 0.5 mg by mouth every morning.   Yes Historical Provider, MD  levETIRAcetam (KEPPRA) 100 MG/ML solution Take 5 mLs by mouth 2 (two) times daily. 01/13/14  Yes Historical Provider, MD  linagliptin (TRADJENTA) 5 MG TABS tablet Take 5 mg by mouth every morning.    Yes Historical Provider, MD  metoprolol succinate (TOPROL-XL) 100 MG 24 hr tablet Take 100 mg by mouth every morning. Take with or immediately following a meal.   Yes Historical Provider, MD  pantoprazole (PROTONIX) 40 MG tablet Take 40 mg by mouth every morning.   Yes Historical Provider, MD  potassium chloride (MICRO-K) 10 MEQ CR capsule Take 10-20 mEq by mouth See admin instructions. Alternate taking one Potassium daily with taking one Potassium twice daily with Torsemide as directed   Yes Historical Provider, MD  pravastatin (PRAVACHOL) 20 MG tablet Take 20 mg by mouth at bedtime.    Yes Historical Provider, MD  sodium bicarbonate 650 MG tablet Take 650 mg by mouth 3 (three) times daily.   Yes Historical Provider, MD  torsemide (DEMADEX) 100 MG tablet Take 0.5 tablets (50 mg total) by mouth daily. 05/02/14  Yes Eugenie Filler, MD  Vitamin D, Ergocalciferol, (DRISDOL) 50000 UNITS CAPS Take 50,000 Units by mouth every 30 (thirty) days. Takes on Tuesday   Yes Historical Provider, MD   BP 154/68  Pulse 76  Temp(Src) 97.8 F (36.6 C) (Oral)  Resp 24  SpO2 100% Physical Exam  Constitutional: She is oriented to person, place, and time. She appears well-developed.  HENT:  Head: Normocephalic.  Eyes: Conjunctivae and EOM are normal. No scleral icterus.  Neck: Neck supple. No thyromegaly present.  Cardiovascular: Normal rate and regular rhythm.  Exam reveals no gallop and no friction rub.   No murmur heard. Pulmonary/Chest: No stridor. She has no wheezes. She has no rales. She exhibits no tenderness.  Abdominal: She exhibits no distension. There is no tenderness. There is no rebound.  Musculoskeletal: Normal range of motion.  She exhibits no edema.  Lymphadenopathy:    She has no cervical adenopathy.  Neurological: She is oriented to person, place, and time. She exhibits normal muscle tone. Coordination normal.  Skin: No rash noted. No erythema.  Psychiatric: She has a normal mood and affect. Her behavior is normal.    ED Course  Procedures (including critical care time) Labs Review Labs Reviewed  CBC WITH DIFFERENTIAL - Abnormal; Notable for the following:    RBC 2.32 (*)    Hemoglobin 6.4 (*)    HCT 20.3 (*)    RDW 17.5 (*)    All other components within normal limits  BASIC METABOLIC PANEL - Abnormal; Notable for the following:    Glucose, Bld 149 (*)    BUN 57 (*)    Creatinine, Ser 2.52 (*)    GFR calc  non Af Amer 18 (*)    GFR calc Af Amer 21 (*)    All other components within normal limits  PREPARE RBC (CROSSMATCH)  TYPE AND SCREEN    Imaging Review No results found.   EKG Interpretation None      MDM   Final diagnoses:  Iron deficiency anemia due to chronic blood loss    Admit      Maudry Diego, MD 06/22/14 2011

## 2014-06-22 NOTE — ED Notes (Signed)
Patient's daughter states she has to leave but wanted to leave her number to call if needed. Name is [name], phone numbers are [phone] or [phone].

## 2014-06-22 NOTE — ED Notes (Signed)
Up ambulatory to the bathroom without difficulty.

## 2014-06-22 NOTE — ED Notes (Signed)
Pt had blood work drawn yesterday and  Daughter says pt 's hgb is 7.

## 2014-06-23 DIAGNOSIS — F03918 Unspecified dementia, unspecified severity, with other behavioral disturbance: Secondary | ICD-10-CM

## 2014-06-23 DIAGNOSIS — I5022 Chronic systolic (congestive) heart failure: Secondary | ICD-10-CM

## 2014-06-23 DIAGNOSIS — F0391 Unspecified dementia with behavioral disturbance: Secondary | ICD-10-CM

## 2014-06-23 DIAGNOSIS — D500 Iron deficiency anemia secondary to blood loss (chronic): Secondary | ICD-10-CM

## 2014-06-23 DIAGNOSIS — D509 Iron deficiency anemia, unspecified: Secondary | ICD-10-CM

## 2014-06-23 LAB — COMPREHENSIVE METABOLIC PANEL
ALT: 10 U/L (ref 0–35)
AST: 19 U/L (ref 0–37)
Albumin: 2.9 g/dL — ABNORMAL LOW (ref 3.5–5.2)
Alkaline Phosphatase: 64 U/L (ref 39–117)
Anion gap: 12 (ref 5–15)
BUN: 51 mg/dL — ABNORMAL HIGH (ref 6–23)
CALCIUM: 8.8 mg/dL (ref 8.4–10.5)
CO2: 25 mEq/L (ref 19–32)
CREATININE: 2.28 mg/dL — AB (ref 0.50–1.10)
Chloride: 107 mEq/L (ref 96–112)
GFR calc Af Amer: 23 mL/min — ABNORMAL LOW (ref >90)
GFR, EST NON AFRICAN AMERICAN: 20 mL/min — AB (ref >90)
Glucose, Bld: 134 mg/dL — ABNORMAL HIGH (ref 70–99)
Potassium: 3.9 mEq/L (ref 3.7–5.3)
Sodium: 144 mEq/L (ref 137–147)
Total Bilirubin: 0.3 mg/dL (ref 0.3–1.2)
Total Protein: 8.4 g/dL — ABNORMAL HIGH (ref 6.0–8.3)

## 2014-06-23 LAB — CBC
HCT: 27.3 % — ABNORMAL LOW (ref 36.0–46.0)
Hemoglobin: 8.6 g/dL — ABNORMAL LOW (ref 12.0–15.0)
MCH: 27.7 pg (ref 26.0–34.0)
MCHC: 31.5 g/dL (ref 30.0–36.0)
MCV: 87.8 fL (ref 78.0–100.0)
PLATELETS: 250 10*3/uL (ref 150–400)
RBC: 3.11 MIL/uL — AB (ref 3.87–5.11)
RDW: 16.5 % — ABNORMAL HIGH (ref 11.5–15.5)
WBC: 4.3 10*3/uL (ref 4.0–10.5)

## 2014-06-23 LAB — GLUCOSE, CAPILLARY
GLUCOSE-CAPILLARY: 136 mg/dL — AB (ref 70–99)
GLUCOSE-CAPILLARY: 192 mg/dL — AB (ref 70–99)
Glucose-Capillary: 75 mg/dL (ref 70–99)
Glucose-Capillary: 128 mg/dL — ABNORMAL HIGH (ref 70–99)

## 2014-06-23 MED ORDER — PANTOPRAZOLE SODIUM 40 MG PO TBEC
40.0000 mg | DELAYED_RELEASE_TABLET | Freq: Every day | ORAL | Status: DC
  Administered 2014-06-23 – 2014-06-26 (×4): 40 mg via ORAL
  Filled 2014-06-23 (×4): qty 1

## 2014-06-23 NOTE — Progress Notes (Signed)
[facility] PROGRESS NOTE  [name] QJJ:[id number] DOB: 1941/03/14 DOA: 06/22/2014 PCP: [name], MD  Assessment/Plan: Anemia  Likely GI bleeding, s/p 1 unit PRBC with appropriate response. Monitor cbc  ? GI bleed   No sign active bleeding. Await GI recommendations. Patient has been seen by GI in the past, the plan was to get colonoscopy as outpatient. EGD recently with h pylori gastritis but not thought to be source bleeding. Keep the patient n.p.o. for possible colonoscopy .  Patient has been taking Aggrenox at home and this is on hold. IV Protonix 40 mg daily   History of diastolic heart failure  Patient had an echocardiogram done on 06/10/2014 which showed EF in the range of 48-18%, grade 2 diastolic dysfunction. Patient has been on Demadex at home, she is not displaying any symptoms and signs of CHF exacerbation. She received IV lasix after blood transfusion and is on 20mg  IV q12 as well. Monitor daily weight and intake and output   Diabetes mellitus   CBG range 123-146. Will continue patient's levemir 8 units subcutaneously at bedtime  Continue sliding scale insulin with NovoLog   Seizure disorder   no s/sx seizure disorder. Continue Keppra   CKD stage IV   remains at baseline which appears to be 2.5-3. Monitor closely given use of IV lasix.  Hypertension   Controlled. Continue hydralazine 75 mg every 8 hours.Continue metoprolol 100 mg daily   Dementia   appears to be at baseline. Continue Aricept  DVT prophylaxis  SCDs    Code Status: full Family Communication: none present Disposition Plan: home when ready   Consultants:  Gastroenterology  Procedures:  none  Antibiotics:  none  HPI/Subjective: Awake. Oriented to self only. Denies pain/discomfort  Objective: Filed Vitals:   06/23/14 0440  BP: 153/75  Pulse: 71  Temp: 97.7 F (36.5 C)  Resp: 20    Intake/Output Summary (Last 24 hours) at 06/23/14 0957 Last data filed at 06/23/14  0556  Gross per 24 hour  Intake 383.75 ml  Output    900 ml  Net -516.25 ml   Filed Weights   06/22/14 2245  Weight: 61.4 kg (135 lb 5.8 oz)    Exam:   General:  Somewhat frail appearing  Cardiovascular: RRR No MGR No LE edema  Respiratory: normal effort BS clear no wheeze  Abdomen: non-distended +BS non-tender   Musculoskeletal: joint without swelling/erythema   Data Reviewed: Basic Metabolic Panel:  Recent Labs Lab 06/22/14 1820 06/23/14 0607  NA 141 144  K 4.3 3.9  CL 105 107  CO2 25 25  GLUCOSE 149* 134*  BUN 57* 51*  CREATININE 2.52* 2.28*  CALCIUM 8.6 8.8   Liver Function Tests:  Recent Labs Lab 06/23/14 0607  AST 19  ALT 10  ALKPHOS 64  BILITOT 0.3  PROT 8.4*  ALBUMIN 2.9*   No results found for this basename: LIPASE, AMYLASE,  in the last 168 hours No results found for this basename: AMMONIA,  in the last 168 hours CBC:  Recent Labs Lab 06/22/14 1820 06/23/14 0607  WBC 4.2 4.3  NEUTROABS 2.8  --   HGB 6.4* 8.6*  HCT 20.3* 27.3*  MCV 87.5 87.8  PLT 237 250   Cardiac Enzymes: No results found for this basename: CKTOTAL, CKMB, CKMBINDEX, TROPONINI,  in the last 168 hours BNP (last 3 results)  Recent Labs  03/15/14 1645 04/27/14 1536 06/09/14 1021  PROBNP 37134.0* 51631.0* 30358.0*   CBG:  Recent Labs Lab 06/22/14  2243 06/23/14 0729  GLUCAP 123* 136*    No results found for this or any previous visit (from the past 240 hour(s)).   Studies: No results found.  Scheduled Meds: . calcitRIOL  0.25 mcg Oral Daily  . donepezil  10 mg Oral QHS  . folic acid  1 mg Oral q morning - 10a  . furosemide  20 mg Intravenous Q12H  . hydrALAZINE  75 mg Oral 3 times per day  . insulin aspart  0-5 Units Subcutaneous QHS  . insulin aspart  0-9 Units Subcutaneous TID WC  . insulin detemir  8 Units Subcutaneous QHS  . isosorbide mononitrate  15 mg Oral q morning - 10a  . levETIRAcetam  500 mg Oral BID  . metoprolol succinate  100 mg  Oral q morning - 10a  . pantoprazole (PROTONIX) IV  40 mg Intravenous QHS  . potassium chloride  10 mEq Oral Daily  . simvastatin  10 mg Oral q1800  . sodium bicarbonate  650 mg Oral TID  . sodium chloride  3 mL Intravenous Q12H  . [START ON 06/25/2014] Vitamin D (Ergocalciferol)  50,000 Units Oral Q30 days   Continuous Infusions:   Principal Problem:   Anemia Active Problems:   Hypertension   Diabetes mellitus   CKD (chronic kidney disease) stage 3, GFR 30-59 ml/min   Acute on chronic renal failure   GI bleeding    Time spent: 35 minutes    [facility] Pager [phone]. If 7PM-7AM, please contact night-coverage at [email or web address], password [facility] 06/23/2014, 9:57 AM  LOS: 1 day

## 2014-06-23 NOTE — Progress Notes (Signed)
Patient admitted with recurrent anemia. She has had multiple admits with anemia and mild troponin elevation over the last several months likely demand ischemia. Colonscopy is considered a low risk procedure. From cardiac standpoint recommend proceeding with planned colonoscopy without prior cardiac testing or interventions in order to diagnose any potential cause of her recurrent severe anemia    [name] MD

## 2014-06-23 NOTE — Consult Note (Signed)
Referring Provider: Charlynne Cousins, MD Primary Care Physician:  [name], MD Primary Gastroenterologist:  Garfield Cornea, MD   Reason for Consultation:  Anemia, GI bleed  HPI: [name] is a 72 y.o. female presented secondary to profound anemia. Recently discharge on July 20 after admission for NSTEMI in setting of profound anemia. Hgb 5 range at that time. Transfused up to 8 range. Heme negative and no overt GI bleeding during last hospitalization. EGD in 12/2013 showed H.pylori gastritis which was treated with Prevpac. Plans for outpatient colonoscopy since 12/2013 but has not been completely due to multiple hospitalization as well as patient noncompliance. Patient reportedly seen by PCP for ongoing/progressive weakness and found to have profound anemia again.   In the emergency department her hemoglobin was 6.4. 10 days previously at time of discharge hemoglobin 9.1. She received one unit of packed red blood cells her for this admission. Her hemoglobin was 8.6 this morning. No Hemoccults thus far this admission. She has been on Aggrenox, previously felt benefits outweigh the risk of bleeding. On concomitant PPI. Patient has history of some degree of dementia. She is alert and oriented to person place and time today. She did not recall the year but knew that it was summer time and who our President is. She gave completely negative review of systems. Question reliability.    Prior to Admission medications   Medication Sig Start Date End Date Taking? Authorizing Provider  calcitRIOL (ROCALTROL) 0.25 MCG capsule Take 1 capsule (0.25 mcg total) by mouth daily. 05/02/14  Yes Eugenie Filler, MD  dipyridamole-aspirin (AGGRENOX) 200-25 MG per 12 hr capsule Take 1 capsule by mouth 2 (two) times daily.   Yes Historical Provider, MD  donepezil (ARICEPT) 10 MG tablet Take 10 mg by mouth at bedtime.  01/30/14  Yes Historical Provider, MD  folic acid (FOLVITE) 1 MG tablet Take 1 mg by mouth every  morning.    Yes Historical Provider, MD  hydrALAZINE (APRESOLINE) 25 MG tablet Take 75 mg by mouth every 8 (eight) hours.   Yes Historical Provider, MD  insulin detemir (LEVEMIR) 100 UNIT/ML injection Inject 0.08 mLs (8 Units total) into the skin at bedtime. 05/02/14  Yes Eugenie Filler, MD  isosorbide mononitrate (IMDUR) 30 MG 24 hr tablet Take 0.5 mg by mouth every morning.   Yes Historical Provider, MD  levETIRAcetam (KEPPRA) 100 MG/ML solution Take 5 mLs by mouth 2 (two) times daily. 01/13/14  Yes Historical Provider, MD  linagliptin (TRADJENTA) 5 MG TABS tablet Take 5 mg by mouth every morning.    Yes Historical Provider, MD  metoprolol succinate (TOPROL-XL) 100 MG 24 hr tablet Take 100 mg by mouth every morning. Take with or immediately following a meal.   Yes Historical Provider, MD  pantoprazole (PROTONIX) 40 MG tablet Take 40 mg by mouth every morning.   Yes Historical Provider, MD  potassium chloride (MICRO-K) 10 MEQ CR capsule Take 10-20 mEq by mouth See admin instructions. Alternate taking one Potassium daily with taking one Potassium twice daily with Torsemide as directed   Yes Historical Provider, MD  pravastatin (PRAVACHOL) 20 MG tablet Take 20 mg by mouth at bedtime.    Yes Historical Provider, MD  sodium bicarbonate 650 MG tablet Take 650 mg by mouth 3 (three) times daily.   Yes Historical Provider, MD  torsemide (DEMADEX) 100 MG tablet Take 0.5 tablets (50 mg total) by mouth daily. 05/02/14  Yes Eugenie Filler, MD  Vitamin D, Ergocalciferol, (DRISDOL) 50000  UNITS CAPS Take 50,000 Units by mouth every 30 (thirty) days. Takes on Tuesday   Yes Historical Provider, MD    Current Facility-Administered Medications  Medication Dose Route Frequency Provider Last Rate Last Dose  . 0.9 %  sodium chloride infusion  250 mL Intravenous PRN Oswald Hillock, MD      . acetaminophen (TYLENOL) tablet 650 mg  650 mg Oral Q6H PRN Oswald Hillock, MD       Or  . acetaminophen (TYLENOL) suppository 650  mg  650 mg Rectal Q6H PRN Oswald Hillock, MD      . calcitRIOL (ROCALTROL) capsule 0.25 mcg  0.25 mcg Oral Daily Oswald Hillock, MD      . donepezil (ARICEPT) tablet 10 mg  10 mg Oral QHS Oswald Hillock, MD   10 mg at 06/22/14 2314  . folic acid (FOLVITE) tablet 1 mg  1 mg Oral q morning - 10a Oswald Hillock, MD      . furosemide (LASIX) injection 20 mg  20 mg Intravenous Q12H Oswald Hillock, MD   20 mg at 06/22/14 2311  . hydrALAZINE (APRESOLINE) tablet 75 mg  75 mg Oral 3 times per day Oswald Hillock, MD   75 mg at 06/23/14 0553  . insulin aspart (novoLOG) injection 0-5 Units  0-5 Units Subcutaneous QHS Oswald Hillock, MD      . insulin aspart (novoLOG) injection 0-9 Units  0-9 Units Subcutaneous TID WC Oswald Hillock, MD   1 Units at 06/23/14 0902  . insulin detemir (LEVEMIR) injection 8 Units  8 Units Subcutaneous QHS Oswald Hillock, MD   8 Units at 06/22/14 2314  . isosorbide mononitrate (IMDUR) 24 hr tablet 15 mg  15 mg Oral q morning - 10a Oswald Hillock, MD      . levETIRAcetam (KEPPRA) 100 MG/ML solution 500 mg  500 mg Oral BID Oswald Hillock, MD   500 mg at 06/22/14 2314  . metoprolol succinate (TOPROL-XL) 24 hr tablet 100 mg  100 mg Oral q morning - 10a Oswald Hillock, MD      . ondansetron (ZOFRAN) tablet 4 mg  4 mg Oral Q6H PRN Oswald Hillock, MD       Or  . ondansetron (ZOFRAN) injection 4 mg  4 mg Intravenous Q6H PRN Oswald Hillock, MD      . pantoprazole (PROTONIX) injection 40 mg  40 mg Intravenous QHS Oswald Hillock, MD   40 mg at 06/22/14 2313  . potassium chloride (K-DUR,KLOR-CON) CR tablet 10 mEq  10 mEq Oral Daily Oswald Hillock, MD      . simvastatin (ZOCOR) tablet 10 mg  10 mg Oral q1800 Oswald Hillock, MD   10 mg at 06/22/14 2314  . sodium bicarbonate tablet 650 mg  650 mg Oral TID Oswald Hillock, MD   650 mg at 06/22/14 2314  . sodium chloride 0.9 % injection 3 mL  3 mL Intravenous Q12H Oswald Hillock, MD   3 mL at 06/22/14 2245  . sodium chloride 0.9 % injection 3 mL  3 mL Intravenous PRN Oswald Hillock, MD       . Derrill Memo ON 06/25/2014] Vitamin D (Ergocalciferol) (DRISDOL) capsule 50,000 Units  50,000 Units Oral Q30 days Oswald Hillock, MD        Allergies as of 06/22/2014  . (No Known Allergies)    Past Medical History  Diagnosis Date  . Type  2 diabetes mellitus   . Essential hypertension, benign   . History of stroke     Previously on Coumadin  . MI, old     Reported 47  . Seizure disorder   . Coronary atherosclerosis of native coronary artery     Multivessel status post CABG 2002  . History of GI bleed     Erosive gastritis and duodenitis by EGD 2002  . Mixed hyperlipidemia   . Ischemic cardiomyopathy     LVEF 40-45% March 2013  . Dementia   . Stroke   . Seizures   . CKD (chronic kidney disease) stage 4, GFR 15-29 ml/min   . CHF (congestive heart failure)     Past Surgical History  Procedure Laterality Date  . Coronary artery bypass graft  July 2002    LIMA to LAD, SVG to OM1, SVG to OM 2, SVG to RCA  . Tee without cardioversion  01/28/2012    Procedure: TRANSESOPHAGEAL ECHOCARDIOGRAM (TEE);  Surgeon: Lelon Perla, MD;  Location: [facility] ENDOSCOPY;  Service: Cardiovascular;  Laterality: [name];  . Colonoscopy  2002  . Esophagogastroduodenoscopy  2002  . Esophagogastroduodenoscopy [name] 01/18/2014    NWG:NFAOZ hiatal hernia. Abnormal gastric and duodenal bulbar mucosa of uncertain significance - status post gastric bx (chronic gastritis/H.pylori +    Family History  Problem Relation Age of Onset  . Stroke Father   . Diabetes type II Mother   . Colon cancer Neg Hx     History   Social History  . Marital Status: Single    Spouse Name: [name]    Number of Children: [name]  . Years of Education: [name]   Occupational History  . Not on file.   Social History Main Topics  . Smoking status: Former Smoker -- 1.00 packs/day for 50 years    Types: Cigarettes  . Smokeless tobacco: Not on file  . Alcohol Use: No  . Drug Use: No  . Sexual Activity: Not on file   Other Topics Concern   . Not on file   Social History Narrative  . No narrative on file     ROS: Question reliability  General: Negative for anorexia, weight loss, fever, chills, fatigue, weakness. Eyes: Negative for vision changes.  ENT: Negative for hoarseness, difficulty swallowing , nasal congestion. CV: Negative for chest pain, angina, palpitations, dyspnea on exertion, peripheral edema.  Respiratory: Negative for dyspnea at rest, dyspnea on exertion, cough, sputum, wheezing.  GI: See history of present illness. GU:  Negative for dysuria, hematuria, urinary incontinence, urinary frequency, nocturnal urination.  MS: Negative for joint pain, low back pain.  Derm: Negative for rash or itching.  Neuro: Negative for weakness, abnormal sensation, seizure, frequent headaches, memory loss, confusion.  Psych: Negative for anxiety, depression, suicidal ideation, hallucinations.  Endo: Negative for unusual weight change.  Heme: Negative for bruising or bleeding. Allergy: Negative for rash or hives.       Physical Examination: Vital signs in last 24 hours: Temp:  [97.7 F (36.5 C)-98.5 F (36.9 C)] 97.7 F (36.5 C) (07/31 0440) Pulse Rate:  [70-99] 71 (07/31 0440) Resp:  [18-24] 20 (07/31 0440) BP: (126-154)/(59-85) 153/75 mmHg (07/31 0440) SpO2:  [91 %-100 %] 100 % (07/31 0440) Weight:  [135 lb 5.8 oz (61.4 kg)] 135 lb 5.8 oz (61.4 kg) (07/30 2245) Last BM Date: 06/21/14  General: Well-nourished, well-developed in no acute distress.  Head: Normocephalic, atraumatic.   Eyes: Conjunctiva pink, no icterus. Mouth: Oropharyngeal mucosa moist and pink ,  no lesions erythema or exudate. Neck: Supple without thyromegaly, masses, or lymphadenopathy.  Lungs: Clear to auscultation bilaterally.  Heart: Regular rate and rhythm, no murmurs rubs or gallops.  Abdomen: Bowel sounds are normal, nontender, nondistended, no hepatosplenomegaly or masses, no abdominal bruits or    hernia , no rebound or guarding.    Rectal: Not performed Extremities: No lower extremity edema, clubbing, deformity.  Neuro: Alert and oriented x 4 , grossly normal neurologically.  Skin: Warm and dry, no rash or jaundice.   Psych: Alert and cooperative, normal mood and affect.        Intake/Output from previous day: 07/30 0701 - 07/31 0700 In: 383.8 [Blood:383.8] Out: 900 [Urine:900] Intake/Output this shift:    Lab Results: CBC  Recent Labs  06/22/14 1820 06/23/14 0607  WBC 4.2 4.3  HGB 6.4* 8.6*  HCT 20.3* 27.3*  MCV 87.5 87.8  PLT 237 250   BMET  Recent Labs  06/22/14 1820 06/23/14 0607  NA 141 144  K 4.3 3.9  CL 105 107  CO2 25 25  GLUCOSE 149* 134*  BUN 57* 51*  CREATININE 2.52* 2.28*  CALCIUM 8.6 8.8   LFT  Recent Labs  06/23/14 0607  BILITOT 0.3  ALKPHOS 64  AST 19  ALT 10  PROT 8.4*  ALBUMIN 2.9*    Lipase No results found for this basename: LIPASE,  in the last 72 hours  PT/INR No results found for this basename: LABPROT, INR,  in the last 72 hours    Imaging Studies: Dg Chest Portable 1 View  06/09/2014   CLINICAL DATA:  Abnormal labs, history hypertension, diabetes, stroke, coronary artery disease post MI  EXAM: PORTABLE CHEST - 1 VIEW  COMPARISON:  Portable exam 1028 hr compared to 04/27/2014  FINDINGS: Enlargement of cardiac silhouette post CABG.  Slight pulmonary vascular congestion.  Mediastinal contours normal.  No acute failure or consolidation.  No pleural effusion or pneumothorax.  Bones unremarkable.  IMPRESSION: Enlargement of cardiac silhouette with mild pulmonary vascular congestion post CABG.  No acute abnormalities.   Electronically Signed   By: Lavonia Dana M.D.   On: 06/09/2014 10:41  [4 week]   Impression: 73 year old lady who presents with acute on chronic anemia. Recent hospitalization for the same, discharged on July 20. At that time had non-ST segment MI in the setting of profound anemia. EGD back in February showed H. pylori gastritis. She underwent  Prevpac therapy. We have been suggesting colonoscopy, planned as an outpatient previously but has not yet been done given multiple hospitalizations. At this time she does not have any overt GI bleeding. As an outpatient has been on Aggrenox, previously felt that benefits outweighed the risk. Suspect occult GI bleeding as culprit of drop in her hemoglobin.  Plan: 1. Discussed with Dr. Oneida Alar. She will determine when colonoscopy would be appropriate based on recent cardiac issues. We'll start full liquids diet for the meantime. 2. Continue PPI therapy.   We would like to thank you for the opportunity to participate in the care of Sherrilynn [place].   LOS: 1 day   Neil Crouch  06/23/2014, 9:32 AM

## 2014-06-23 NOTE — Progress Notes (Signed)
Dr. Oneida Alar paged about possible colonoscopy.  Stated that patient will not have a colonoscopy tomorrow.  Patient currently NPO.  Dr. Aileen Fass paged for possible diet order.

## 2014-06-23 NOTE — Progress Notes (Signed)
-   Have reviewed the information as stated above and agree with plan. - Colonoscopy is a low risk procedure patient is on no antiplatelet therapy. - Awaiting GI recommendations patient currently n.p.o. - Patient currently euvolemic no JVD or crackles bases. Continue to diuretics.

## 2014-06-23 NOTE — Consult Note (Signed)
REVIEWED.  

## 2014-06-23 NOTE — Care Management Note (Addendum)
    Page 1 of 1   06/26/2014     3:27:14 PM CARE MANAGEMENT NOTE 06/26/2014  Patient:  [name], [name]   Account Number:  [id number]  Date Initiated:  06/23/2014  Documentation initiated by:  Jolene Provost  Subjective/Objective Assessment:   Patient admited with anemia. Pt from home, lives with daughter. Patient is active with [facility] RN services. Patient has a walker and home o2 but does not remember from what company they come from.     Action/Plan:   Patient plans to go home at discharge with resumption of [facility] services. No CM needs at this time.   Anticipated DC Date:  06/26/2014   Anticipated DC Plan:  [place]  CM consult      [facility] Choice  Resumption Of Svcs/PTA Provider   Choice offered to / List presented to:  [name]        [place] arranged  HH-1 RN      [place].   Status of service:  Completed, signed off Medicare Important Message given?  YES (If response is "NO", the following Medicare IM given date fields will be blank) Date Medicare IM given:  06/23/2014 Medicare IM given by:  Jolene Provost Date Additional Medicare IM given:  06/26/2014 Additional Medicare IM given by:  Vladimir Creeks  Discharge Disposition:  [place]  Per UR Regulation:  Reviewed for med. necessity/level of care/duration of stay  If discussed at [place] of Stay Meetings, dates discussed:    Comments:  06/26/14 1500 Vladimir Creeks RN/CM Pt being D/C home today with [facility] to resume. She will follow-up with GI for further testing 06/23/2014 1330 Jolene Provost, RN, MSN, PCCN Possiblity of Pt being d/c'd home over weekend. [place] is active with [facility] and services will be resumed upon d/c. [name] with [facility] aware of patients admission. Note left to RN to call [facility] and fax [place] orders upon D/C if over the weekend. No CM needs noted at this time.

## 2014-06-23 NOTE — Progress Notes (Signed)
UR chart review completed.  

## 2014-06-24 DIAGNOSIS — K922 Gastrointestinal hemorrhage, unspecified: Secondary | ICD-10-CM

## 2014-06-24 DIAGNOSIS — D649 Anemia, unspecified: Secondary | ICD-10-CM

## 2014-06-24 LAB — CBC
HEMATOCRIT: 26.5 % — AB (ref 36.0–46.0)
Hemoglobin: 8.6 g/dL — ABNORMAL LOW (ref 12.0–15.0)
MCH: 28.9 pg (ref 26.0–34.0)
MCHC: 32.5 g/dL (ref 30.0–36.0)
MCV: 88.9 fL (ref 78.0–100.0)
Platelets: 300 10*3/uL (ref 150–400)
RBC: 2.98 MIL/uL — ABNORMAL LOW (ref 3.87–5.11)
RDW: 16.9 % — ABNORMAL HIGH (ref 11.5–15.5)
WBC: 4 10*3/uL (ref 4.0–10.5)

## 2014-06-24 LAB — GLUCOSE, CAPILLARY
GLUCOSE-CAPILLARY: 142 mg/dL — AB (ref 70–99)
GLUCOSE-CAPILLARY: 123 mg/dL — AB (ref 70–99)
GLUCOSE-CAPILLARY: 166 mg/dL — AB (ref 70–99)
Glucose-Capillary: 114 mg/dL — ABNORMAL HIGH (ref 70–99)

## 2014-06-24 LAB — BASIC METABOLIC PANEL
Anion gap: 11 (ref 5–15)
BUN: 42 mg/dL — AB (ref 6–23)
CO2: 24 mEq/L (ref 19–32)
Calcium: 8.7 mg/dL (ref 8.4–10.5)
Chloride: 105 mEq/L (ref 96–112)
Creatinine, Ser: 2.19 mg/dL — ABNORMAL HIGH (ref 0.50–1.10)
GFR calc Af Amer: 24 mL/min — ABNORMAL LOW (ref >90)
GFR, EST NON AFRICAN AMERICAN: 21 mL/min — AB (ref >90)
GLUCOSE: 127 mg/dL — AB (ref 70–99)
Potassium: 3.9 mEq/L (ref 3.7–5.3)
SODIUM: 140 meq/L (ref 137–147)

## 2014-06-24 MED ORDER — PEG 3350-KCL-NA BICARB-NACL 420 G PO SOLR
4000.0000 mL | Freq: Once | ORAL | Status: AC
  Administered 2014-06-24: 4000 mL via ORAL
  Filled 2014-06-24: qty 4000

## 2014-06-24 MED ORDER — TORSEMIDE 20 MG PO TABS
50.0000 mg | ORAL_TABLET | Freq: Every day | ORAL | Status: DC
  Administered 2014-06-24 – 2014-06-26 (×3): 50 mg via ORAL
  Filled 2014-06-24 (×3): qty 3

## 2014-06-24 NOTE — Progress Notes (Addendum)
Subjective:  Patient has no complaints. She states she has good appetite and ate most of her breakfast. She denies nausea vomiting abdominal pain or rectal bleeding. She wants to proceed with colonoscopy. She says her daughter would be in later today after work.    Objective: Blood pressure 138/60, pulse 66, temperature 98.4 F (36.9 C), temperature source Oral, resp. rate 20, height 5\' 5"  (1.651 m), weight 134 lb 4.2 oz (60.9 kg), SpO2 100.00%. Patient is alert and in no acute distress. Conjunctiva is pale. Sclera is nonicteric Abdomen is soft and nontender. No organomegaly or masses. No LE edema or clubbing noted.  Labs/studies Results:   Recent Labs  06/22/14 1820 06/23/14 0607 06/24/14 0500  WBC 4.2 4.3 4.0  HGB 6.4* 8.6* 8.6*  HCT 20.3* 27.3* 26.5*  PLT 237 250 300    BMET   Recent Labs  06/22/14 1820 06/23/14 0607 06/24/14 0500  NA 141 144 140  K 4.3 3.9 3.9  CL 105 107 105  CO2 25 25 24   GLUCOSE 149* 134* 127*  BUN 57* 51* 42*  CREATININE 2.52* 2.28* 2.19*  CALCIUM 8.6 8.8 8.7      Assessment:  #1. Acute on chronic anemia. No overt GI bleed. She was heme-positive and she presented back in February and had EGD and was treated for H. pylori gastritis. Colonoscopy was recommended but has not been accomplished. Iron studies in June 2015 revealed low serum iron low saturation and low normal ferritin. Therefore her anemia would appear to be multifactorial with occultGI bleed being one of the causes. Patient has been cleared by cardiology for colonoscopy.   Recommendations;  CBC in a.m. Prep with GoLYTELY this afternoon. Colonoscopy tomorrow morning.

## 2014-06-24 NOTE — Progress Notes (Signed)
[facility] PROGRESS NOTE  [name] SFK:[id number] DOB: 10-Jul-1941 DOA: 06/22/2014 PCP: Kimberly Font, MD  Assessment/Plan: Anemia  Likely GI bleeding, s/p 1 unit PRBC with appropriate response. Monitor cbc   Acute GI bleed  - No sign active bleeding. GI recommended Colonscopy on 8.2.2015.  - Patient has been taking Aggrenox at home and this is on hold.Protonix 40 mg daily   Chronic diastolic heart failure  - Monitor daily weight and intake and output - Compensated. Cont current home meds.  Diabetes mellitus  - CBG range 123-146.  - Continue long acting insulin and  sliding scale insulin with NovoLog   Seizure disorder   no s/sx seizure disorder. Continue Keppra   CKD stage IV  - Remains at baseline which appears to be 2.5-3.   Hypertension  - Controlled. Continue hydralazine 75 mg every 8 hours.Continue metoprolol 100 mg daily   Dementia   appears to be at baseline. Continue Aricept.  DVT prophylaxis  SCDs    Code Status: full Family Communication: none present Disposition Plan: home when ready   Consultants:  Gastroenterology  Procedures:  none  Antibiotics:  none  HPI/Subjective: Awake. Oriented to self only. Denies pain/discomfort  Objective: Filed Vitals:   06/24/14 0512  BP: 138/60  Pulse: 66  Temp: 98.4 F (36.9 C)  Resp: 20    Intake/Output Summary (Last 24 hours) at 06/24/14 1051 Last data filed at 06/24/14 0900  Gross per 24 hour  Intake    120 ml  Output      0 ml  Net    120 ml   Filed Weights   06/22/14 2245 06/24/14 0512  Weight: 61.4 kg (135 lb 5.8 oz) 60.9 kg (134 lb 4.2 oz)    Exam:   General:  Somewhat frail appearing  Cardiovascular: RRR No MGR No LE edema  Respiratory: normal effort BS clear no wheeze  Abdomen: non-distended +BS non-tender   Musculoskeletal: joint without swelling/erythema   Data Reviewed: Basic Metabolic Panel:  Recent Labs Lab 06/22/14 1820 06/23/14 0607 06/24/14 0500   NA 141 144 140  K 4.3 3.9 3.9  CL 105 107 105  CO2 25 25 24   GLUCOSE 149* 134* 127*  BUN 57* 51* 42*  CREATININE 2.52* 2.28* 2.19*  CALCIUM 8.6 8.8 8.7   Liver Function Tests:  Recent Labs Lab 06/23/14 0607  AST 19  ALT 10  ALKPHOS 64  BILITOT 0.3  PROT 8.4*  ALBUMIN 2.9*   No results found for this basename: LIPASE, AMYLASE,  in the last 168 hours No results found for this basename: AMMONIA,  in the last 168 hours CBC:  Recent Labs Lab 06/22/14 1820 06/23/14 0607 06/24/14 0500  WBC 4.2 4.3 4.0  NEUTROABS 2.8  --   --   HGB 6.4* 8.6* 8.6*  HCT 20.3* 27.3* 26.5*  MCV 87.5 87.8 88.9  PLT 237 250 300   Cardiac Enzymes: No results found for this basename: CKTOTAL, CKMB, CKMBINDEX, TROPONINI,  in the last 168 hours BNP (last 3 results)  Recent Labs  03/15/14 1645 04/27/14 1536 06/09/14 1021  PROBNP 37134.0* 51631.0* 30358.0*   CBG:  Recent Labs Lab 06/23/14 0729 06/23/14 1201 06/23/14 1637 06/23/14 2243 06/24/14 0736  GLUCAP 136* 75 192* 128* 114*    No results found for this or any previous visit (from the past 240 hour(s)).   Studies: No results found.  Scheduled Meds: . calcitRIOL  0.25 mcg Oral Daily  . donepezil  10 mg Oral  QHS  . folic acid  1 mg Oral q morning - 10a  . hydrALAZINE  75 mg Oral 3 times per day  . insulin aspart  0-5 Units Subcutaneous QHS  . insulin aspart  0-9 Units Subcutaneous TID WC  . insulin detemir  8 Units Subcutaneous QHS  . isosorbide mononitrate  15 mg Oral q morning - 10a  . levETIRAcetam  500 mg Oral BID  . metoprolol succinate  100 mg Oral q morning - 10a  . pantoprazole  40 mg Oral QAC supper  . potassium chloride  10 mEq Oral Daily  . simvastatin  10 mg Oral q1800  . sodium bicarbonate  650 mg Oral TID  . sodium chloride  3 mL Intravenous Q12H  . [START ON 06/25/2014] Vitamin D (Ergocalciferol)  50,000 Units Oral Q30 days   Continuous Infusions:   Principal Problem:   Anemia Active Problems:    Hypertension   Diabetes mellitus   CKD (chronic kidney disease) stage 3, GFR 30-59 ml/min   Acute on chronic renal failure   GI bleeding    Time spent: 35 minutes    [name]  [facility] Pager [phone]. If 7PM-7AM, please contact night-coverage at [email or web address], password [facility] 06/24/2014, 10:51 AM  LOS: 2 days

## 2014-06-25 ENCOUNTER — Encounter (HOSPITAL_COMMUNITY)
Admission: EM | Disposition: A | Discharge status: Home Health | Payer: Self-pay | Source: Home / Self Care | Attending: Internal Medicine

## 2014-06-25 ENCOUNTER — Encounter (HOSPITAL_COMMUNITY): Payer: Self-pay | Admitting: *Deleted

## 2014-06-25 DIAGNOSIS — K5730 Diverticulosis of large intestine without perforation or abscess without bleeding: Secondary | ICD-10-CM

## 2014-06-25 DIAGNOSIS — N184 Chronic kidney disease, stage 4 (severe): Secondary | ICD-10-CM

## 2014-06-25 DIAGNOSIS — D126 Benign neoplasm of colon, unspecified: Secondary | ICD-10-CM

## 2014-06-25 HISTORY — PX: COLONOSCOPY: SHX5424

## 2014-06-25 LAB — CBC
HCT: 25.2 % — ABNORMAL LOW (ref 36.0–46.0)
HEMOGLOBIN: 8.1 g/dL — AB (ref 12.0–15.0)
MCH: 28.4 pg (ref 26.0–34.0)
MCHC: 32.1 g/dL (ref 30.0–36.0)
MCV: 88.4 fL (ref 78.0–100.0)
Platelets: 266 10*3/uL (ref 150–400)
RBC: 2.85 MIL/uL — AB (ref 3.87–5.11)
RDW: 16.9 % — ABNORMAL HIGH (ref 11.5–15.5)
WBC: 3.9 10*3/uL — ABNORMAL LOW (ref 4.0–10.5)

## 2014-06-25 LAB — GLUCOSE, CAPILLARY
GLUCOSE-CAPILLARY: 84 mg/dL (ref 70–99)
GLUCOSE-CAPILLARY: 113 mg/dL — AB (ref 70–99)
GLUCOSE-CAPILLARY: 151 mg/dL — AB (ref 70–99)
Glucose-Capillary: 135 mg/dL — ABNORMAL HIGH (ref 70–99)

## 2014-06-25 SURGERY — COLONOSCOPY
Anesthesia: Moderate Sedation

## 2014-06-25 MED ORDER — SODIUM CHLORIDE 0.9 % IJ SOLN
10.0000 mL | Freq: Two times a day (BID) | INTRAMUSCULAR | Status: DC
  Administered 2014-06-25: 10 mL
  Administered 2014-06-26: 20 mL

## 2014-06-25 MED ORDER — SODIUM CHLORIDE 0.9 % IJ SOLN: 10.0000 mL | INTRAMUSCULAR | Status: DC | PRN

## 2014-06-25 MED ORDER — MIDAZOLAM HCL 5 MG/5ML IJ SOLN
INTRAMUSCULAR | Status: DC | PRN
  Administered 2014-06-25: 2 mg via INTRAVENOUS
  Administered 2014-06-25: 1 mg via INTRAVENOUS

## 2014-06-25 MED ORDER — ACETAMINOPHEN 325 MG PO TABS
650.0000 mg | ORAL_TABLET | Freq: Once | ORAL | Status: AC
  Administered 2014-06-25: 650 mg via ORAL
  Filled 2014-06-25: qty 2

## 2014-06-25 MED ORDER — SODIUM CHLORIDE 0.9 % IV SOLN
INTRAVENOUS | Status: DC | PRN
  Administered 2014-06-25: 1000 mL

## 2014-06-25 MED ORDER — MEPERIDINE HCL 50 MG/ML IJ SOLN
INTRAMUSCULAR | Status: AC
  Filled 2014-06-25: qty 1

## 2014-06-25 MED ORDER — DIPHENHYDRAMINE HCL 25 MG PO CAPS
25.0000 mg | ORAL_CAPSULE | Freq: Once | ORAL | Status: AC
  Administered 2014-06-25: 25 mg via ORAL
  Filled 2014-06-25: qty 1

## 2014-06-25 MED ORDER — SODIUM CHLORIDE 0.9 % IV SOLN: Freq: Once | INTRAVENOUS | Status: DC

## 2014-06-25 MED ORDER — MIDAZOLAM HCL 5 MG/5ML IJ SOLN
INTRAMUSCULAR | Status: AC
  Filled 2014-06-25: qty 10

## 2014-06-25 MED ORDER — DEXTROSE-NACL 5-0.9 % IV SOLN
INTRAVENOUS | Status: DC | PRN
  Administered 2014-06-25: 75 mL/h via INTRAVENOUS

## 2014-06-25 MED ORDER — MEPERIDINE HCL 50 MG/ML IJ SOLN
INTRAMUSCULAR | Status: DC | PRN
  Administered 2014-06-25: 25 mg

## 2014-06-25 NOTE — Op Note (Addendum)
COLONOSCOPY PROCEDURE REPORT  PATIENT:  [name]  MR#:  [id number] Birthdate:  [birth year], 73 y.o., female Endoscopist:  Dr. Rogene Houston, MD Referred By:  Dr. Oswald Hillock, MD Procedure Date: 06/25/2014  Procedure:   Colonoscopy with snare polypectomy.  Indications:  Patient is a 73 year old African female with multiple medical problems including history of GI bleed with anemia and need for transfusion. She had EGD back in February this year and has been treated for H. pylori gastritis. She suffered non-STEMI last month felt to be secondary to mismatch and has been cleared by cardiology for conscious sedation. Informed consent for the procedure was obtained from [name].  Informed Consent:  The procedure and risks were reviewed with the patient and informed consent was obtained.  Medications:  Demerol 25 mg IV Versed 3 mg IV  Description of procedure:  After a digital rectal exam was performed, that colonoscope was advanced from the anus through the rectum and colon to the area of the cecum, ileocecal valve and appendiceal orifice. The cecum was deeply intubated. These structures were well-seen and photographed for the record. From the level of the cecum and ileocecal valve, the scope was slowly and cautiously withdrawn. The mucosal surfaces were carefully surveyed utilizing scope tip to flexion to facilitate fold flattening as needed. The scope was pulled down into the rectum where a thorough exam including retroflexion was performed.  Findings:  Prep satisfactory. She had patchy coating of mucosa of with iron. Moderate number of diverticula is noted at sigmoid colon and a few more involving rest of the colon. 8 mm polyp hot snared from ascending colon. Another small polyp at ascending colon was coagulated using snare tip. Normal rectal mucosa and anal rectal junction.   Therapeutic/Diagnostic Maneuvers Performed:  See above  Complications:  None  Cecal  Withdrawal Time:  13 minutes  Impression:  Examination performed to cecum. 8 mm polyp hot snared from ascending colon. Another small polyp at ascending colon was correlated using snare tip. Pancolonic diverticulosis. Most of the diverticula are located at sigmoid colon.  Comment; Bleeding lesion not identified. She may have bled from colonic diverticulosis. She will need  given capsule study to complete her workup.    Recommendations:  No aspirin for 5 days. Transfuse 1 unit of PRBCs today. Small bowel given capsule study into 10-14 days. No oral iron until given capsule study completed.  Solana Coggin U  06/25/2014 12:58 PM  CC: Dr. [name], MD & Dr. Rayne Du ref. provider found

## 2014-06-25 NOTE — Progress Notes (Signed)
[facility] PROGRESS NOTE  [name] IHK:[id number] DOB: February 22, 1941 DOA: 06/22/2014 PCP: Kimberly Font, MD  Assessment/Plan: Anemia  - Likely GI bleeding, s/p 2 unit PRBC with appropriate response.  - Hbg 10.0, cbc in am monitor for bleeding.  Acute GI bleed: - No sign active bleeding. GI recommended Colonscopy on 8.2.2015 showed: 8 mm polyp hot snared from ascending colon.  Another small polyp at ascending colon was correlated using snare tip.  Pancolonic diverticulosis. - Patient has been taking Aggrenox at home and this is on hold.Protonix 40 mg daily . Holding Aggrenox.  Chronic diastolic heart failure: - Monitor daily weight and intake and output. - Compensated. Cont current home meds.  Diabetes mellitus: - CBG range 123-146.  - Continue long acting insulin and  sliding scale insulin with NovoLog   Seizure disorder: - no s/sx seizure disorder. Continue Keppra   CKD stage IV: - Remains at baseline which appears to be 2.5-3.   Essential Hypertension:  - Controlled. Continue hydralazine 75 mg every 8 hours.Continue metoprolol 100 mg daily   Dementia:  appears to be at baseline. Continue Aricept.  DVT prophylaxis  SCDs   Code Status: full Family Communication: none present Disposition Plan: hopefully in am.   Consultants:  Gastroenterology  Procedures:  none  Antibiotics:  none  HPI/Subjective: Awake.  Denies pain/discomfort  Objective: Filed Vitals:   06/25/14 1413  BP: 141/66  Pulse: 74  Temp: 97.7 F (36.5 C)  Resp: 20    Intake/Output Summary (Last 24 hours) at 06/25/14 1450 Last data filed at 06/25/14 1400  Gross per 24 hour  Intake    360 ml  Output      0 ml  Net    360 ml   Filed Weights   06/22/14 2245 06/24/14 0512 06/25/14 0604  Weight: 61.4 kg (135 lb 5.8 oz) 60.9 kg (134 lb 4.2 oz) 60.7 kg (133 lb 13.1 oz)    Exam:   General:  Alert  Cardiovascular: RRR No MGR No LE edema  Respiratory: normal effort BS  clear no wheeze  Abdomen: non-distended +BS non-tender    Data Reviewed: Basic Metabolic Panel:  Recent Labs Lab 06/22/14 1820 06/23/14 0607 06/24/14 0500  NA 141 144 140  K 4.3 3.9 3.9  CL 105 107 105  CO2 25 25 24   GLUCOSE 149* 134* 127*  BUN 57* 51* 42*  CREATININE 2.52* 2.28* 2.19*  CALCIUM 8.6 8.8 8.7   Liver Function Tests:  Recent Labs Lab 06/23/14 0607  AST 19  ALT 10  ALKPHOS 64  BILITOT 0.3  PROT 8.4*  ALBUMIN 2.9*   No results found for this basename: LIPASE, AMYLASE,  in the last 168 hours No results found for this basename: AMMONIA,  in the last 168 hours CBC:  Recent Labs Lab 06/22/14 1820 06/23/14 0607 06/24/14 0500 06/25/14 0630  WBC 4.2 4.3 4.0 3.9*  NEUTROABS 2.8  --   --   --   HGB 6.4* 8.6* 8.6* 8.1*  HCT 20.3* 27.3* 26.5* 25.2*  MCV 87.5 87.8 88.9 88.4  PLT 237 250 300 266   Cardiac Enzymes: No results found for this basename: CKTOTAL, CKMB, CKMBINDEX, TROPONINI,  in the last 168 hours BNP (last 3 results)  Recent Labs  03/15/14 1645 04/27/14 1536 06/09/14 1021  PROBNP 37134.0* 51631.0* 30358.0*   CBG:  Recent Labs Lab 06/24/14 1147 06/24/14 1714 06/24/14 2054 06/25/14 0748 06/25/14 1150  GLUCAP 142* 123* 166* 84 113*    No results  found for this or any previous visit (from the past 240 hour(s)).   Studies: No results found.  Scheduled Meds: . sodium chloride   Intravenous Once  . acetaminophen  650 mg Oral Once  . calcitRIOL  0.25 mcg Oral Daily  . diphenhydrAMINE  25 mg Oral Once  . donepezil  10 mg Oral QHS  . folic acid  1 mg Oral q morning - 10a  . hydrALAZINE  75 mg Oral 3 times per day  . insulin aspart  0-5 Units Subcutaneous QHS  . insulin aspart  0-9 Units Subcutaneous TID WC  . insulin detemir  8 Units Subcutaneous QHS  . isosorbide mononitrate  15 mg Oral q morning - 10a  . levETIRAcetam  500 mg Oral BID  . meperidine      . metoprolol succinate  100 mg Oral q morning - 10a  . midazolam       . pantoprazole  40 mg Oral QAC supper  . potassium chloride  10 mEq Oral Daily  . simvastatin  10 mg Oral q1800  . sodium bicarbonate  650 mg Oral TID  . sodium chloride  10-40 mL Intracatheter Q12H  . sodium chloride  3 mL Intravenous Q12H  . torsemide  50 mg Oral Daily  . Vitamin D (Ergocalciferol)  50,000 Units Oral Q30 days   Continuous Infusions:   Principal Problem:   Anemia Active Problems:   Hypertension   Diabetes mellitus   CKD (chronic kidney disease) stage 3, GFR 30-59 ml/min   Acute on chronic renal failure   GI bleeding    Time spent: 35 minutes    [name]  [facility] Pager [phone]. If 7PM-7AM, please contact night-coverage at [email or web address], password [facility] 06/25/2014, 2:50 PM  LOS: 3 days

## 2014-06-25 NOTE — OR Nursing (Signed)
Patient to Endoscopy after having PICC placed. Will proceed with colonoscopy.

## 2014-06-25 NOTE — OR Nursing (Signed)
Patient arrived to endoscopy and IV was swollen and painful. IV removed with tip intact. IV was attempted in left hand and left wrist by Jeralyn Bennett RN, IV attempted in right thumb, right forearm and right hand by Toy Baker RN, 1 attempt in right should by Tresa Garter RN, and 1 attempt in left [facility] by Dr. Laural Golden. All attempts were unsuccessful. Patient to have a PICC placed prior to procedure. Dr. Laural Golden spoke with patient's daughter-Vicky Carp and consent was obtained.

## 2014-06-26 ENCOUNTER — Other Ambulatory Visit: Payer: Self-pay | Admitting: Gastroenterology

## 2014-06-26 ENCOUNTER — Telehealth: Payer: Self-pay | Admitting: Gastroenterology

## 2014-06-26 DIAGNOSIS — N179 Acute kidney failure, unspecified: Secondary | ICD-10-CM

## 2014-06-26 DIAGNOSIS — I5043 Acute on chronic combined systolic (congestive) and diastolic (congestive) heart failure: Secondary | ICD-10-CM

## 2014-06-26 DIAGNOSIS — N189 Chronic kidney disease, unspecified: Secondary | ICD-10-CM

## 2014-06-26 LAB — TYPE AND SCREEN
ABO/RH(D): A POS
Antibody Screen: NEGATIVE
Unit division: 0
Unit division: 0

## 2014-06-26 LAB — CBC
HCT: 30.2 % — ABNORMAL LOW (ref 36.0–46.0)
Hemoglobin: 9.7 g/dL — ABNORMAL LOW (ref 12.0–15.0)
MCH: 27.8 pg (ref 26.0–34.0)
MCHC: 32.1 g/dL (ref 30.0–36.0)
MCV: 86.5 fL (ref 78.0–100.0)
PLATELETS: 289 10*3/uL (ref 150–400)
RBC: 3.49 MIL/uL — AB (ref 3.87–5.11)
RDW: 17.8 % — AB (ref 11.5–15.5)
WBC: 5.4 10*3/uL (ref 4.0–10.5)

## 2014-06-26 LAB — GLUCOSE, CAPILLARY
GLUCOSE-CAPILLARY: 87 mg/dL (ref 70–99)
Glucose-Capillary: 86 mg/dL (ref 70–99)
Glucose-Capillary: 128 mg/dL — ABNORMAL HIGH (ref 70–99)

## 2014-06-26 MED ORDER — ASPIRIN-DIPYRIDAMOLE ER 25-200 MG PO CP12: 1.0000 | ORAL_CAPSULE | Freq: Two times a day (BID) | ORAL | Status: DC

## 2014-06-26 NOTE — Progress Notes (Signed)
    Subjective: Pleasantly confused. No overt GI bleeding. Denies abdominal pain, N/V. Ate breakfast this morning.   Objective: Vital signs in last 24 hours: Temp:  [97 F (36.1 C)-98.1 F (36.7 C)] 98 F (36.7 C) (08/03 0628) Pulse Rate:  [39-108] 57 (08/03 0628) Resp:  [16-21] 20 (08/03 0628) BP: (104-186)/(51-96) 134/96 mmHg (08/03 0628) SpO2:  [85 %-100 %] 99 % (08/03 0628) Weight:  [138 lb 7.2 oz (62.8 kg)] 138 lb 7.2 oz (62.8 kg) (08/03 0628) Last BM Date: 06/25/14 General:   Alert and oriented to person and place, unknown year and situation.  Head:  Normocephalic and atraumatic. Abdomen:  Bowel sounds present, soft, non-tender, non-distended. No HSM or hernias noted.  Psych:  Alert and cooperative. Normal mood and affect.  Intake/Output from previous day: 08/02 0701 - 08/03 0700 In: 1142.5 [P.O.:360; I.V.:500; Blood:282.5] Out: -  Intake/Output this shift:    Lab Results:  Recent Labs  06/24/14 0500 06/25/14 0630 06/26/14 0544  WBC 4.0 3.9* 5.4  HGB 8.6* 8.1* 9.7*  HCT 26.5* 25.2* 30.2*  PLT 300 266 289   BMET  Recent Labs  06/24/14 0500  NA 140  K 3.9  CL 105  CO2 24  GLUCOSE 127*  BUN 42*  CREATININE 2.19*  CALCIUM 8.7    Assessment/Plan: 73 year old female with acute on chronic anemia with history of H.pylori gastritis in February treated with Prevpac. Colonoscopy over the weekend with pancolonic diverticulosis, s/p polypectomy X 2. Ultimately needs outpatient capsule study to wrap up GI work-up. Anemia multifactorial. No overt signs of GI bleeding and Hgb stable.   Agile capsule was to be completed today to ensure she would be able to complete a capsule study; however, she has eaten breakfast and would need to be NPO after midnight. She is appropriate for discharge home with close outpatient follow-up. Discussed with hospitalist service. We will arrange outpatient agile capsule study prior to actual capsule study ordered. Patient to be discharged  today. Please hold iron until after capsule completed.   Orvil Feil, ANP-BC [facility]    LOS: 4 days    06/26/2014, 7:52 AM

## 2014-06-26 NOTE — Discharge Summary (Signed)
Physician Discharge Summary  [name] RKY:[id number] DOB: 23-May-1941 DOA: 06/22/2014  PCP: [name], MD  Admit date: 06/22/2014 Discharge date: 06/26/2014  Time spent: 40 minutes  Recommendations for Outpatient Follow-up:  1. Follow up PCP 1 week for evaluation of symptoms and tracking of Hg. 2. Kimberly Emperor NP gastroenterology arranging OP capsule study scheduled for 07/06/14 3. Home health services resumed  Discharge Diagnoses:  Principal Problem:   Anemia Active Problems:   Hypertension   Diabetes mellitus   CKD (chronic kidney disease) stage 3, GFR 30-59 ml/min   Acute on chronic renal failure   GI bleeding   Discharge Condition: stable  Diet recommendation: carb modified  Filed Weights   06/24/14 0512 06/25/14 0604 06/26/14 0628  Weight: 60.9 kg (134 lb 4.2 oz) 60.7 kg (133 lb 13.1 oz) 62.8 kg (138 lb 7.2 oz)    History of present illness:  73 year old female who has a past medical history of Type 2 diabetes mellitus; Essential hypertension, benign; History of stroke; MI, old; Seizure disorder; Coronary atherosclerosis of native coronary artery; History of GI bleed; Mixed hyperlipidemia; Ischemic cardiomyopathy; Dementia; Stroke; Seizures; CKD (chronic kidney disease) stage 4, GFR 15-29 ml/min; and CHF (congestive heart failure), recently discharged from the hospital after she was given blood transfusion for the anemia. EGD was performed in February 2015 which showed H. pylori gastritis, patient was supposed to get colonoscopy as outpatient. She presented to ED on 06/22/14 with generalized weakness. She had been to PCP and lab work showed that patient had anemia. Patient  poor historian, she denied chest pain no shortness of breath no nausea vomiting or diarrhea. She denied passing out. In the ED patient is found to have hemoglobin of 6.4 and was transfused 1 unit [facility]   Hospital Course:  Anemia  - Likely GI bleeding, s/p 2 unit PRBC with appropriate response.  - Hbg  10.7 at discharge  Acute GI bleed:  - No sign active bleeding. Colonscopy on 8.2.2015 showed: 8 mm polyp hot snared from ascending colon. Another small polyp at ascending colon was correlated using snare tip. Pancolonic diverticulosis. Bleeding lesion not identified per GI - Patient has been taking Aggrenox at home and this was held . Resume home Protonix 40 mg daily . resume Aggrenox with instructions to discontinue if any bleeding noted.   -Gastroenterology will contact to schedule OP capsule study Chronic diastolic heart failure:  - Remained compensated during this hospitalization. - Cont current home meds.  Diabetes mellitus:  - A1C 6.6 -resume home regimen   Seizure disorder:  - no s/sx seizure disorder. Continue Keppra  CKD stage IV:  - Remained at baseline which appears to be 2.5-3.  Essential Hypertension:  - Controlled.   Dementia:  Remained at baseline. Continue Aricept.    Procedures: Colonoscopy 8/2/15Examination performed to cecum.  8 mm polyp hot snared from ascending colon.  Another small polyp at ascending colon was correlated using snare tip.  Pancolonic diverticulosis. Most of the diverticula are located at sigmoid colon.   Consultations:  GI  Discharge Exam: Filed Vitals:   06/26/14 0958  BP:   Pulse: 71  Temp:   Resp:     General: well nourished NAD Cardiovascular: RRR +murmur no gallup no rub no LE edema Respiratory: normal effort BS clear bilaterally no wheeze Abdomen: soft +BS non-tender to palpation  Discharge Instructions You were cared for by a hospitalist during your hospital stay. If you have any questions about your discharge medications or the care  you received while you were in the hospital after you are discharged, you can call the unit and asked to speak with the hospitalist on call if the hospitalist that took care of you is not available. Once you are discharged, your primary care physician will handle any further medical issues. Please  note that NO REFILLS for any discharge medications will be authorized once you are discharged, as it is imperative that you return to your primary care physician (or establish a relationship with a primary care physician if you do not have one) for your aftercare needs so that they can reassess your need for medications and monitor your lab values.     Medication List         calcitRIOL 0.25 MCG capsule  Commonly known as:  ROCALTROL  Take 1 capsule (0.25 mcg total) by mouth daily.     dipyridamole-aspirin 200-25 MG per 12 hr capsule  Commonly known as:  AGGRENOX  Take 1 capsule by mouth 2 (two) times daily. Stop taking if any bleeding noted     donepezil 10 MG tablet  Commonly known as:  ARICEPT  Take 10 mg by mouth at bedtime.     folic acid 1 MG tablet  Commonly known as:  FOLVITE  Take 1 mg by mouth every morning.     hydrALAZINE 25 MG tablet  Commonly known as:  APRESOLINE  Take 75 mg by mouth every 8 (eight) hours.     insulin detemir 100 UNIT/ML injection  Commonly known as:  LEVEMIR  Inject 0.08 mLs (8 Units total) into the skin at bedtime.     isosorbide mononitrate 30 MG 24 hr tablet  Commonly known as:  IMDUR  Take 0.5 mg by mouth every morning.     levETIRAcetam 100 MG/ML solution  Commonly known as:  KEPPRA  Take 5 mLs by mouth 2 (two) times daily.     metoprolol succinate 100 MG 24 hr tablet  Commonly known as:  TOPROL-XL  Take 100 mg by mouth every morning. Take with or immediately following a meal.     pantoprazole 40 MG tablet  Commonly known as:  PROTONIX  Take 40 mg by mouth every morning.     potassium chloride 10 MEQ CR capsule  Commonly known as:  MICRO-K  Take 10-20 mEq by mouth See admin instructions. Alternate taking one Potassium daily with taking one Potassium twice daily with Torsemide as directed     pravastatin 20 MG tablet  Commonly known as:  PRAVACHOL  Take 20 mg by mouth at bedtime.     sodium bicarbonate 650 MG tablet  Take  650 mg by mouth 3 (three) times daily.     torsemide 100 MG tablet  Commonly known as:  DEMADEX  Take 0.5 tablets (50 mg total) by mouth daily.     TRADJENTA 5 MG Tabs tablet  Generic drug:  linagliptin  Take 5 mg by mouth every morning.     Vitamin D (Ergocalciferol) 50000 UNITS Caps capsule  Commonly known as:  DRISDOL  Take 50,000 Units by mouth every 30 (thirty) days. Takes on Tuesday       No Known Allergies Follow-up Information   Follow up with [facility].   Contact information:   [address] [place] [address] [phone]       Follow up with [facility] K, MD. Schedule an appointment as soon as possible for a visit in 1 week. (evaluation of symptoms and  monitoring of Hg)    Specialty:  Family Medicine   Contact information:   [address] [place] [address] [phone]        The results of significant diagnostics from this hospitalization (including imaging, microbiology, ancillary and laboratory) are listed below for reference.    Significant Diagnostic Studies: Dg Chest Portable 1 View  06/09/2014   CLINICAL DATA:  Abnormal labs, history hypertension, diabetes, stroke, coronary artery disease post MI  EXAM: PORTABLE CHEST - 1 VIEW  COMPARISON:  Portable exam 1028 hr compared to 04/27/2014  FINDINGS: Enlargement of cardiac silhouette post CABG.  Slight pulmonary vascular congestion.  Mediastinal contours normal.  No acute failure or consolidation.  No pleural effusion or pneumothorax.  Bones unremarkable.  IMPRESSION: Enlargement of cardiac silhouette with mild pulmonary vascular congestion post CABG.  No acute abnormalities.   Electronically Signed   By: Lavonia Dana M.D.   On: 06/09/2014 10:41    Microbiology: No results found for this or any previous visit (from the past 240 hour(s)).   Labs: Basic Metabolic Panel:  Recent Labs Lab 06/22/14 1820 06/23/14 0607 06/24/14 0500  NA 141 144 140  K 4.3 3.9 3.9  CL  105 107 105  CO2 25 25 24   GLUCOSE 149* 134* 127*  BUN 57* 51* 42*  CREATININE 2.52* 2.28* 2.19*  CALCIUM 8.6 8.8 8.7   Liver Function Tests:  Recent Labs Lab 06/23/14 0607  AST 19  ALT 10  ALKPHOS 64  BILITOT 0.3  PROT 8.4*  ALBUMIN 2.9*   No results found for this basename: LIPASE, AMYLASE,  in the last 168 hours No results found for this basename: AMMONIA,  in the last 168 hours CBC:  Recent Labs Lab 06/22/14 1820 06/23/14 0607 06/24/14 0500 06/25/14 0630 06/26/14 0544  WBC 4.2 4.3 4.0 3.9* 5.4  NEUTROABS 2.8  --   --   --   --   HGB 6.4* 8.6* 8.6* 8.1* 9.7*  HCT 20.3* 27.3* 26.5* 25.2* 30.2*  MCV 87.5 87.8 88.9 88.4 86.5  PLT 237 250 300 266 289   Cardiac Enzymes: No results found for this basename: CKTOTAL, CKMB, CKMBINDEX, TROPONINI,  in the last 168 hours BNP: BNP (last 3 results)  Recent Labs  03/15/14 1645 04/27/14 1536 06/09/14 1021  PROBNP 37134.0* 51631.0* 30358.0*   CBG:  Recent Labs Lab 06/25/14 1150 06/25/14 1636 06/25/14 2044 06/26/14 0713 06/26/14 1117  GLUCAP 113* 151* 135* 86 128*    Signed:  BLACK,KAREN M  [facility] 06/26/2014, 11:33 AM   Patient seen and examined at bedside. The patient underwent colonoscopy. Per GI recommendations she will need capsule endoscopy. Patient can resume Aggrenox but was instructed to stop it immediately if any sign of bleeding. Patient is stable for discharge today. Patient agrees with the discharge home.  Leisa Lenz [facility] [phone]

## 2014-06-26 NOTE — Telephone Encounter (Signed)
Agile Study is scheduled for Thursday August 13th and I have mailed Mrs Konitzer instructions and I will call her once she is home

## 2014-06-26 NOTE — Progress Notes (Signed)
Discharge education completed by RN. Pt and daughter received a copy of discharge paperwork and confirm understanding of follow up appointments and discharge medications. Both deny any questions at this time. IV removed, site is within normal limits. Pt will discharge from the unit via wheelchair. 

## 2014-06-26 NOTE — Progress Notes (Signed)
REVIEWED.  

## 2014-06-26 NOTE — Telephone Encounter (Signed)
Patient will be discharged today. Hx of acute on chronic anemia, no overt GI bleeding.   Needs AGILE CAPSULE STUDY ordered as outpatient. Once this is reviewed and noted to be appropriate, can proceed with [facility]. However, we need to make sure to do the agile patency capsule study first.

## 2014-06-27 NOTE — Progress Notes (Signed)
UR chart review completed.  

## 2014-06-28 ENCOUNTER — Encounter (HOSPITAL_COMMUNITY): Payer: Self-pay | Admitting: Pharmacy Technician

## 2014-06-28 ENCOUNTER — Encounter (HOSPITAL_COMMUNITY): Payer: Self-pay | Admitting: Internal Medicine

## 2014-07-06 ENCOUNTER — Ambulatory Visit (HOSPITAL_COMMUNITY)
Admission: RE | Admit: 2014-07-06 | Discharge: 2014-07-06 | Disposition: A | Discharge status: Home / Self Care | Payer: Medicare Other | Source: Ambulatory Visit | Attending: Gastroenterology | Admitting: Gastroenterology

## 2014-07-06 ENCOUNTER — Encounter (HOSPITAL_COMMUNITY)
Admission: RE | Disposition: A | Discharge status: Home / Self Care | Payer: Self-pay | Source: Ambulatory Visit | Attending: Gastroenterology

## 2014-07-06 DIAGNOSIS — D649 Anemia, unspecified: Secondary | ICD-10-CM | POA: Insufficient documentation

## 2014-07-06 DIAGNOSIS — Z538 Procedure and treatment not carried out for other reasons: Secondary | ICD-10-CM | POA: Insufficient documentation

## 2014-07-06 DIAGNOSIS — K922 Gastrointestinal hemorrhage, unspecified: Secondary | ICD-10-CM | POA: Diagnosis not present

## 2014-07-06 HISTORY — PX: AGILE CAPSULE: SHX5420

## 2014-07-06 SURGERY — AGILE CAPSULE

## 2014-07-06 NOTE — Progress Notes (Signed)
Patient unable to swallow agile patency capsule and Dr.Fields notified.

## 2014-07-11 ENCOUNTER — Encounter (HOSPITAL_COMMUNITY): Payer: Self-pay | Admitting: Gastroenterology

## 2014-07-17 NOTE — Telephone Encounter (Signed)
OPV SEP 3 E30 at 1130 am

## 2014-07-17 NOTE — Telephone Encounter (Signed)
PLEASE CALL PT. SHE NEEDS AN OPV TO DISCUSS [facility]IVENS CAPSULE PLACEMENT E30 ANEMIA/OBSCURE GI BLEED.

## 2014-07-18 ENCOUNTER — Inpatient Hospital Stay (HOSPITAL_COMMUNITY)
Admission: EM | Admit: 2014-07-18 | Discharge: 2014-07-22 | DRG: 378 | Disposition: A | Discharge status: Home Health | Payer: Medicare Other | Attending: Internal Medicine | Admitting: Internal Medicine

## 2014-07-18 ENCOUNTER — Encounter (HOSPITAL_COMMUNITY): Payer: Self-pay | Admitting: Emergency Medicine

## 2014-07-18 DIAGNOSIS — Z794 Long term (current) use of insulin: Secondary | ICD-10-CM

## 2014-07-18 DIAGNOSIS — R5381 Other malaise: Secondary | ICD-10-CM | POA: Diagnosis present

## 2014-07-18 DIAGNOSIS — I10 Essential (primary) hypertension: Secondary | ICD-10-CM | POA: Diagnosis present

## 2014-07-18 DIAGNOSIS — I2510 Atherosclerotic heart disease of native coronary artery without angina pectoris: Secondary | ICD-10-CM | POA: Diagnosis present

## 2014-07-18 DIAGNOSIS — I2589 Other forms of chronic ischemic heart disease: Secondary | ICD-10-CM | POA: Diagnosis present

## 2014-07-18 DIAGNOSIS — N179 Acute kidney failure, unspecified: Secondary | ICD-10-CM | POA: Diagnosis present

## 2014-07-18 DIAGNOSIS — G40909 Epilepsy, unspecified, not intractable, without status epilepticus: Secondary | ICD-10-CM | POA: Diagnosis present

## 2014-07-18 DIAGNOSIS — E860 Dehydration: Secondary | ICD-10-CM | POA: Diagnosis present

## 2014-07-18 DIAGNOSIS — Z7982 Long term (current) use of aspirin: Secondary | ICD-10-CM | POA: Diagnosis not present

## 2014-07-18 DIAGNOSIS — E782 Mixed hyperlipidemia: Secondary | ICD-10-CM | POA: Diagnosis present

## 2014-07-18 DIAGNOSIS — K449 Diaphragmatic hernia without obstruction or gangrene: Secondary | ICD-10-CM | POA: Diagnosis present

## 2014-07-18 DIAGNOSIS — Z823 Family history of stroke: Secondary | ICD-10-CM

## 2014-07-18 DIAGNOSIS — Z87891 Personal history of nicotine dependence: Secondary | ICD-10-CM | POA: Diagnosis not present

## 2014-07-18 DIAGNOSIS — D500 Iron deficiency anemia secondary to blood loss (chronic): Secondary | ICD-10-CM | POA: Diagnosis present

## 2014-07-18 DIAGNOSIS — I252 Old myocardial infarction: Secondary | ICD-10-CM

## 2014-07-18 DIAGNOSIS — K922 Gastrointestinal hemorrhage, unspecified: Secondary | ICD-10-CM | POA: Diagnosis present

## 2014-07-18 DIAGNOSIS — N189 Chronic kidney disease, unspecified: Secondary | ICD-10-CM | POA: Diagnosis not present

## 2014-07-18 DIAGNOSIS — F0390 Unspecified dementia without behavioral disturbance: Secondary | ICD-10-CM | POA: Diagnosis present

## 2014-07-18 DIAGNOSIS — N184 Chronic kidney disease, stage 4 (severe): Secondary | ICD-10-CM | POA: Diagnosis present

## 2014-07-18 DIAGNOSIS — Z833 Family history of diabetes mellitus: Secondary | ICD-10-CM

## 2014-07-18 DIAGNOSIS — Z951 Presence of aortocoronary bypass graft: Secondary | ICD-10-CM | POA: Diagnosis not present

## 2014-07-18 DIAGNOSIS — I129 Hypertensive chronic kidney disease with stage 1 through stage 4 chronic kidney disease, or unspecified chronic kidney disease: Secondary | ICD-10-CM | POA: Diagnosis present

## 2014-07-18 DIAGNOSIS — D509 Iron deficiency anemia, unspecified: Secondary | ICD-10-CM

## 2014-07-18 DIAGNOSIS — I509 Heart failure, unspecified: Secondary | ICD-10-CM | POA: Diagnosis present

## 2014-07-18 DIAGNOSIS — N289 Disorder of kidney and ureter, unspecified: Secondary | ICD-10-CM

## 2014-07-18 DIAGNOSIS — Z8673 Personal history of transient ischemic attack (TIA), and cerebral infarction without residual deficits: Secondary | ICD-10-CM | POA: Diagnosis not present

## 2014-07-18 DIAGNOSIS — I255 Ischemic cardiomyopathy: Secondary | ICD-10-CM | POA: Diagnosis present

## 2014-07-18 DIAGNOSIS — D131 Benign neoplasm of stomach: Secondary | ICD-10-CM | POA: Diagnosis present

## 2014-07-18 DIAGNOSIS — E119 Type 2 diabetes mellitus without complications: Secondary | ICD-10-CM | POA: Diagnosis present

## 2014-07-18 DIAGNOSIS — R531 Weakness: Secondary | ICD-10-CM

## 2014-07-18 DIAGNOSIS — D649 Anemia, unspecified: Secondary | ICD-10-CM

## 2014-07-18 DIAGNOSIS — D62 Acute posthemorrhagic anemia: Secondary | ICD-10-CM

## 2014-07-18 LAB — BASIC METABOLIC PANEL
Anion gap: 16 — ABNORMAL HIGH (ref 5–15)
BUN: 63 mg/dL — AB (ref 6–23)
CALCIUM: 8.5 mg/dL (ref 8.4–10.5)
CO2: 22 mEq/L (ref 19–32)
CREATININE: 3.08 mg/dL — AB (ref 0.50–1.10)
Chloride: 105 mEq/L (ref 96–112)
GFR calc Af Amer: 16 mL/min — ABNORMAL LOW (ref >90)
GFR, EST NON AFRICAN AMERICAN: 14 mL/min — AB (ref >90)
GLUCOSE: 143 mg/dL — AB (ref 70–99)
Potassium: 4 mEq/L (ref 3.7–5.3)
Sodium: 143 mEq/L (ref 137–147)

## 2014-07-18 LAB — CBC WITH DIFFERENTIAL/PLATELET
BASOS PCT: 0 % (ref 0–1)
Basophils Absolute: 0 10*3/uL (ref 0.0–0.1)
Eosinophils Absolute: 0 10*3/uL (ref 0.0–0.7)
Eosinophils Relative: 1 % (ref 0–5)
HCT: 16.7 % — ABNORMAL LOW (ref 36.0–46.0)
HEMOGLOBIN: 5.1 g/dL — AB (ref 12.0–15.0)
LYMPHS ABS: 1.2 10*3/uL (ref 0.7–4.0)
Lymphocytes Relative: 20 % (ref 12–46)
MCH: 27.3 pg (ref 26.0–34.0)
MCHC: 30.5 g/dL (ref 30.0–36.0)
MCV: 89.3 fL (ref 78.0–100.0)
MONOS PCT: 7 % (ref 3–12)
Monocytes Absolute: 0.4 10*3/uL (ref 0.1–1.0)
NEUTROS PCT: 72 % (ref 43–77)
Neutro Abs: 4.2 10*3/uL (ref 1.7–7.7)
Platelets: 318 10*3/uL (ref 150–400)
RBC: 1.87 MIL/uL — AB (ref 3.87–5.11)
RDW: 18.7 % — ABNORMAL HIGH (ref 11.5–15.5)
WBC: 5.9 10*3/uL (ref 4.0–10.5)

## 2014-07-18 LAB — TROPONIN I
Troponin I: <0.3 ng/mL (ref <0.30)
Troponin I: <0.3 ng/mL (ref <0.30)
Troponin I: <0.3 ng/mL (ref <0.30)

## 2014-07-18 LAB — PREPARE RBC (CROSSMATCH)

## 2014-07-18 LAB — GLUCOSE, CAPILLARY
GLUCOSE-CAPILLARY: 116 mg/dL — AB (ref 70–99)
Glucose-Capillary: 156 mg/dL — ABNORMAL HIGH (ref 70–99)

## 2014-07-18 MED ORDER — INSULIN ASPART 100 UNIT/ML ~~LOC~~ SOLN
0.0000 [IU] | Freq: Three times a day (TID) | SUBCUTANEOUS | Status: DC
  Administered 2014-07-19: 2 [IU] via SUBCUTANEOUS
  Administered 2014-07-19 – 2014-07-20 (×3): 1 [IU] via SUBCUTANEOUS
  Administered 2014-07-21: 2 [IU] via SUBCUTANEOUS

## 2014-07-18 MED ORDER — HYDRALAZINE HCL 25 MG PO TABS
75.0000 mg | ORAL_TABLET | Freq: Three times a day (TID) | ORAL | Status: DC
  Administered 2014-07-18 – 2014-07-22 (×10): 75 mg via ORAL
  Filled 2014-07-18 (×10): qty 3

## 2014-07-18 MED ORDER — CALCITRIOL 0.25 MCG PO CAPS
0.2500 ug | ORAL_CAPSULE | Freq: Every day | ORAL | Status: DC
  Administered 2014-07-19: 0.25 ug via ORAL
  Filled 2014-07-18: qty 1

## 2014-07-18 MED ORDER — SODIUM CHLORIDE 0.9 % IJ SOLN
3.0000 mL | Freq: Two times a day (BID) | INTRAMUSCULAR | Status: DC
  Administered 2014-07-19 – 2014-07-21 (×3): 3 mL via INTRAVENOUS

## 2014-07-18 MED ORDER — SODIUM BICARBONATE 650 MG PO TABS
650.0000 mg | ORAL_TABLET | Freq: Three times a day (TID) | ORAL | Status: DC
  Administered 2014-07-18 – 2014-07-22 (×10): 650 mg via ORAL
  Filled 2014-07-18 (×10): qty 1

## 2014-07-18 MED ORDER — SODIUM CHLORIDE 0.9 % IV SOLN
INTRAVENOUS | Status: DC
  Administered 2014-07-18 – 2014-07-21 (×4): via INTRAVENOUS

## 2014-07-18 MED ORDER — FOLIC ACID 1 MG PO TABS
1.0000 mg | ORAL_TABLET | Freq: Every morning | ORAL | Status: DC
  Administered 2014-07-19: 1 mg via ORAL
  Filled 2014-07-18: qty 1

## 2014-07-18 MED ORDER — PANTOPRAZOLE SODIUM 40 MG PO TBEC: 40.0000 mg | DELAYED_RELEASE_TABLET | Freq: Every morning | ORAL | Status: DC

## 2014-07-18 MED ORDER — LINAGLIPTIN 5 MG PO TABS
5.0000 mg | ORAL_TABLET | Freq: Every morning | ORAL | Status: DC
  Administered 2014-07-19: 5 mg via ORAL
  Filled 2014-07-18: qty 1

## 2014-07-18 MED ORDER — ISOSORBIDE MONONITRATE ER 30 MG PO TB24
15.0000 mg | ORAL_TABLET | Freq: Every morning | ORAL | Status: DC
  Administered 2014-07-19: 15 mg via ORAL
  Filled 2014-07-18: qty 1

## 2014-07-18 MED ORDER — LEVETIRACETAM 100 MG/ML PO SOLN
500.0000 mg | Freq: Two times a day (BID) | ORAL | Status: DC
  Administered 2014-07-18 – 2014-07-22 (×7): 500 mg via ORAL
  Filled 2014-07-18 (×15): qty 5

## 2014-07-18 MED ORDER — ONDANSETRON HCL 4 MG PO TABS: 4.0000 mg | ORAL_TABLET | Freq: Four times a day (QID) | ORAL | Status: DC | PRN

## 2014-07-18 MED ORDER — ONDANSETRON HCL 4 MG/2ML IJ SOLN: 4.0000 mg | Freq: Four times a day (QID) | INTRAMUSCULAR | Status: DC | PRN

## 2014-07-18 MED ORDER — POTASSIUM CHLORIDE CRYS ER 10 MEQ PO TBCR
10.0000 meq | EXTENDED_RELEASE_TABLET | Freq: Every day | ORAL | Status: DC
  Administered 2014-07-18 – 2014-07-19 (×2): 10 meq via ORAL
  Filled 2014-07-18 (×2): qty 1

## 2014-07-18 MED ORDER — METOPROLOL SUCCINATE ER 50 MG PO TB24
50.0000 mg | ORAL_TABLET | Freq: Every morning | ORAL | Status: DC
  Administered 2014-07-19 – 2014-07-20 (×2): 50 mg via ORAL
  Filled 2014-07-18 (×2): qty 1

## 2014-07-18 MED ORDER — VITAMIN D (ERGOCALCIFEROL) 1.25 MG (50000 UNIT) PO CAPS: 50000.0000 [IU] | ORAL_CAPSULE | ORAL | Status: DC

## 2014-07-18 MED ORDER — PANTOPRAZOLE SODIUM 40 MG IV SOLR
40.0000 mg | INTRAVENOUS | Status: DC
  Administered 2014-07-19 – 2014-07-20 (×2): 40 mg via INTRAVENOUS
  Filled 2014-07-18 (×2): qty 40

## 2014-07-18 MED ORDER — INSULIN DETEMIR 100 UNIT/ML ~~LOC~~ SOLN
8.0000 [IU] | Freq: Every day | SUBCUTANEOUS | Status: DC
  Administered 2014-07-18 – 2014-07-21 (×4): 8 [IU] via SUBCUTANEOUS
  Filled 2014-07-18 (×6): qty 0.08

## 2014-07-18 MED ORDER — SIMVASTATIN 10 MG PO TABS
10.0000 mg | ORAL_TABLET | Freq: Every day | ORAL | Status: DC
  Administered 2014-07-18 – 2014-07-19 (×2): 10 mg via ORAL
  Filled 2014-07-18 (×2): qty 1

## 2014-07-18 MED ORDER — INSULIN ASPART 100 UNIT/ML ~~LOC~~ SOLN
0.0000 [IU] | Freq: Every day | SUBCUTANEOUS | Status: DC
  Administered 2014-07-19: 2 [IU] via SUBCUTANEOUS

## 2014-07-18 MED ORDER — DONEPEZIL HCL 5 MG PO TABS
10.0000 mg | ORAL_TABLET | Freq: Every day | ORAL | Status: DC
  Administered 2014-07-18 – 2014-07-21 (×4): 10 mg via ORAL
  Filled 2014-07-18 (×4): qty 2

## 2014-07-18 MED ORDER — SODIUM CHLORIDE 0.9 % IV SOLN: Freq: Once | INTRAVENOUS | Status: DC

## 2014-07-18 MED ORDER — TORSEMIDE 20 MG PO TABS
50.0000 mg | ORAL_TABLET | Freq: Every day | ORAL | Status: DC
Start: 2014-07-18 — End: 2014-07-19

## 2014-07-18 NOTE — ED Notes (Signed)
CRITICAL VALUE ALERT  Critical value received:  hgb 5.1/ hct 16.7  Date of notification:  07/18/14  Time of notification:  7564  Critical value read back: yes Nurse who received alert:  S Madisyn Mawhinney  MD notified (1st page):  Alvino Chapel  Time of first page:  [phone]  MD notified (2nd page):  Time of second page:  Responding MD:  Alvino Chapel  Time MD responded:  [phone]

## 2014-07-18 NOTE — H&P (Addendum)
[facility] History and Physical  [place]ace [place] Sayer YHC:[id number] DOB: [birth year] DOA: 07/18/2014  Referring physician: ER. PCP: [name], MD   Chief Complaint: Weakness.  HPI: [name] is a 73 y.o. female  This is a 73 year old lady who has dementia and is not able to give me a clear history. Her daughter, who is at the bedside, given the history. She describes that the patient has become weaker and blood work done with home health care services showed that she was severely anemic again. She is therefore now being admitted for further investigations. She was admitted at the end of July with severe anemia than with a hemoglobin of 6.4. She was investigated with a colonoscopy which, proximal polyp, was unremarkable. Patient was due to have a outpatient capsule study but apparently could not tolerate swallowing the capsule. Today, she comes in with a hemoglobin of 5.1.   Review of Systems:  Patient unable to give me a clear review of systems secondary to her dementia.   Past Medical History  Diagnosis Date  . Type 2 diabetes mellitus   . Essential hypertension, benign   . History of stroke     Previously on Coumadin  . MI, old     Reported 82  . Seizure disorder   . Coronary atherosclerosis of native coronary artery     Multivessel status post CABG 2002  . History of GI bleed     Erosive gastritis and duodenitis by EGD 2002  . Mixed hyperlipidemia   . Ischemic cardiomyopathy     LVEF 40-45% March 2013  . Dementia   . Stroke   . Seizures   . CKD (chronic kidney disease) stage 4, GFR 15-29 ml/min   . CHF (congestive heart failure)    Past Surgical History  Procedure Laterality Date  . Coronary artery bypass graft  July 2002    LIMA to LAD, SVG to OM1, SVG to OM 2, SVG to RCA  . Tee without cardioversion  01/28/2012    Procedure: TRANSESOPHAGEAL ECHOCARDIOGRAM (TEE);  Surgeon: Lelon Perla, MD;  Location: [facility] ENDOSCOPY;  Service: Cardiovascular;   Laterality: [name];  . Colonoscopy  2002  . Esophagogastroduodenoscopy  2002  . Esophagogastroduodenoscopy [name] 01/18/2014    DVV:OHYWV hiatal hernia. Abnormal gastric and duodenal bulbar mucosa of uncertain significance - status post gastric bx (chronic gastritis/H.pylori +  . Colonoscopy [name] 06/25/2014    Procedure: COLONOSCOPY;  Surgeon: Rogene Houston, MD;  [facility];  Service: Endoscopy;  Laterality: [name];  . Agile capsule [name] 07/06/2014    Procedure: AGILE CAPSULE;  Surgeon: Danie Binder, MD;  Location: [facility] ENDO [facility];  Service: Endoscopy;  Laterality: [name];  730   Social History:  reports that she has quit smoking. She does not have any smokeless tobacco history on file. She reports that she does not drink alcohol or use illicit drugs.  No Known Allergies  Family History  Problem Relation Age of Onset  . Stroke Father   . Diabetes type II Mother   . Colon cancer Neg Hx      Prior to Admission medications   Medication Sig Start Date End Date Taking? Authorizing Provider  aspirin EC 81 MG tablet Take 81 mg by mouth daily.   Yes Historical Provider, MD  calcitRIOL (ROCALTROL) 0.25 MCG capsule Take 1 capsule (0.25 mcg total) by mouth daily. 05/02/14  Yes Eugenie Filler, MD  dipyridamole-aspirin (AGGRENOX) 200-25 MG per 12 hr capsule Take 1 capsule  by mouth 2 (two) times daily. Stop taking if any bleeding noted 06/26/14  Yes Lezlie Octave Black, NP  donepezil (ARICEPT) 10 MG tablet Take 10 mg by mouth at bedtime.  01/30/14  Yes Historical Provider, MD  folic acid (FOLVITE) 1 MG tablet Take 1 mg by mouth every morning.    Yes Historical Provider, MD  hydrALAZINE (APRESOLINE) 25 MG tablet Take 75 mg by mouth every 8 (eight) hours.   Yes Historical Provider, MD  insulin detemir (LEVEMIR) 100 UNIT/ML injection Inject 0.08 mLs (8 Units total) into the skin at bedtime. 05/02/14  Yes Eugenie Filler, MD  isosorbide mononitrate (IMDUR) 30 MG 24 hr tablet Take 0.5 mg by mouth every morning.   Yes  Historical Provider, MD  levETIRAcetam (KEPPRA) 100 MG/ML solution Take 5 mLs by mouth 2 (two) times daily. 01/13/14  Yes Historical Provider, MD  linagliptin (TRADJENTA) 5 MG TABS tablet Take 5 mg by mouth every morning.    Yes Historical Provider, MD  metoprolol succinate (TOPROL-XL) 100 MG 24 hr tablet Take 50 mg by mouth every morning. Take with or immediately following a meal.   Yes Historical Provider, MD  pantoprazole (PROTONIX) 40 MG tablet Take 40 mg by mouth every morning.   Yes Historical Provider, MD  potassium chloride (MICRO-K) 10 MEQ CR capsule Take 10 mEq by mouth See admin instructions. Alternate taking one Potassium daily with taking one Potassium twice daily with Torsemide as directed   Yes Historical Provider, MD  pravastatin (PRAVACHOL) 20 MG tablet Take 20 mg by mouth at bedtime.    Yes Historical Provider, MD  sodium bicarbonate 650 MG tablet Take 650 mg by mouth 3 (three) times daily.   Yes Historical Provider, MD  torsemide (DEMADEX) 100 MG tablet Take 0.5 tablets (50 mg total) by mouth daily. 05/02/14  Yes Eugenie Filler, MD  Vitamin D, Ergocalciferol, (DRISDOL) 50000 UNITS CAPS Take 50,000 Units by mouth every 7 (seven) days. Takes on Tuesday    Historical Provider, MD   Physical Exam: Filed Vitals:   07/18/14 1348  BP: 92/74  Pulse: 78  Temp: 97.5 F (36.4 C)  TempSrc: Oral  Resp: 18  Height: 5\' 5"  (1.651 m)  Weight: 62.596 kg (138 lb)  SpO2: 100%    Wt Readings from Last 3 Encounters:  07/18/14 62.596 kg (138 lb)  06/26/14 62.8 kg (138 lb 7.2 oz)  06/26/14 62.8 kg (138 lb 7.2 oz)    General:  Appears calm and comfortable. She looks chronically sick and is pale. Eyes: PERRL, normal lids, irises & conjunctiva ENT: grossly normal hearing, lips & tongue Neck: no LAD, masses or thyromegaly Cardiovascular: RRR, no m/r/g. No LE edema. Telemetry: SR, no arrhythmias  Respiratory: CTA bilaterally, no w/r/r. Normal respiratory effort. Abdomen: soft, ntnd Skin:  no rash or induration seen on limited exam Musculoskeletal: grossly normal tone BUE/BLE  Neurologic: grossly non-focal.          Labs on Admission:  Basic Metabolic Panel:  Recent Labs Lab 07/18/14 1432  NA 143  K 4.0  CL 105  CO2 22  GLUCOSE 143*  BUN 63*  CREATININE 3.08*  CALCIUM 8.5   Liver Function Tests: No results found for this basename: AST, ALT, ALKPHOS, BILITOT, PROT, ALBUMIN,  in the last 168 hours No results found for this basename: LIPASE, AMYLASE,  in the last 168 hours No results found for this basename: AMMONIA,  in the last 168 hours CBC:  Recent Labs Lab 07/18/14 1432  WBC 5.9  NEUTROABS 4.2  HGB 5.1*  HCT 16.7*  MCV 89.3  PLT 318   Cardiac Enzymes:  Recent Labs Lab 07/18/14 1432  TROPONINI <0.30    BNP (last 3 results)  Recent Labs  03/15/14 1645 04/27/14 1536 06/09/14 1021  PROBNP 37134.0* 51631.0* 30358.0*   CBG: No results found for this basename: GLUCAP,  in the last 168 hours  Radiological Exams on Admission: No results found.    Assessment/Plan   1. Severity  anemia with a hemoglobin of 5.1. Possible GI bleed. 2. Acute on chronic kidney disease. There is a degree of dehydration. 3. Hypertension. 4. Diabetes. 5. Dementia which is probably moderate.  Plan: 1. Admit. 2. Give 3 units blood transfusion. 3. PPI. 4. Gastroenterology consultation. 5. IV fluids. 6. Monitor and control diabetes. 7. Hold Aggrenox.  Further recommendations will depend on patient's hospital progress.   Code Status: Full code.  DVT Prophylaxis: SCDs.  Family Communication: I discussed the plan with the patient's daughter at the bedside.   Disposition Plan: Home when medically stable.   Time spent: 60 minutes.  Doree Albee [facility] Pager [phone].  **Disclaimer: This note may have been dictated with voice recognition software. Similar sounding words can inadvertently be transcribed and this note may contain  transcription errors which may not have been corrected upon publication of note.**

## 2014-07-18 NOTE — ED Notes (Addendum)
Pt reports doctor called and said pts hemoglobin was low and was told to come to [facility]. Pt reports weakness.

## 2014-07-18 NOTE — ED Provider Notes (Signed)
CSN: [id number]     Arrival date & time 07/18/14  1346 History  This chart was scribed for [employer]. Alvino Chapel, MD by Einar Pheasant, ED Scribe. This patient was seen in room [facility]06/[facility] and the patient's care was started at 2:00 PM.    Chief Complaint  Patient presents with  . Anemia   Patient is a 73 y.o. female presenting with anemia.  Anemia   HPI Comments: [name] is a 73 y.o. female who presents to the Emergency Department complaining of anemia today. Pt states that she was seen by her PCP who referred her to come to the ED because her hemoglobin level was low. Family states that they are still not sure were the blood is going, despite several endoscopies. Denies any fever, chills, nausea, emesis, SOB, chest pain, congestion, or cough.   Past Medical History  Diagnosis Date  . Type 2 diabetes mellitus   . Essential hypertension, benign   . History of stroke     Previously on Coumadin  . MI, old     Reported 2  . Seizure disorder   . Coronary atherosclerosis of native coronary artery     Multivessel status post CABG 2002  . History of GI bleed     Erosive gastritis and duodenitis by EGD 2002  . Mixed hyperlipidemia   . Ischemic cardiomyopathy     LVEF 40-45% March 2013  . Dementia   . Stroke   . Seizures   . CKD (chronic kidney disease) stage 4, GFR 15-29 ml/min   . CHF (congestive heart failure)    Past Surgical History  Procedure Laterality Date  . Coronary artery bypass graft  July 2002    LIMA to LAD, SVG to OM1, SVG to OM 2, SVG to RCA  . Tee without cardioversion  01/28/2012    Procedure: TRANSESOPHAGEAL ECHOCARDIOGRAM (TEE);  Surgeon: Lelon Perla, MD;  Location: [facility] ENDOSCOPY;  Service: Cardiovascular;  Laterality: [name];  . Colonoscopy  2002  . Esophagogastroduodenoscopy  2002  . Esophagogastroduodenoscopy [name] 01/18/2014    EPP:IRJJO hiatal hernia. Abnormal gastric and duodenal bulbar mucosa of uncertain significance - status post gastric bx  (chronic gastritis/H.pylori +  . Colonoscopy [name] 06/25/2014    Procedure: COLONOSCOPY;  Surgeon: Rogene Houston, MD;  Location[facility];  Service: Endoscopy;  Laterality: [name];  . Agile capsule [name] 07/06/2014    Procedure: AGILE CAPSULE;  Surgeon: Danie Binder, MD;  Location: [facility]O [facility];  Service: Endoscopy;  Laterality: [name];  730   Family History  Problem Relation Age of Onset  . Stroke Father   . Diabetes type II Mother   . Colon cancer Neg Hx    History  Substance Use Topics  . Smoking status: Former Smoker -- 58 years  . Smokeless tobacco: Not on file  . Alcohol Use: No   OB History   Grav Para Term Preterm Abortions TAB SAB Ect Mult Living                 Review of Systems    Allergies  Review of patient's allergies indicates no known allergies.  Home Medications   Prior to Admission medications   Medication Sig Start Date End Date Taking? Authorizing Provider  aspirin EC 81 MG tablet Take 81 mg by mouth daily.   Yes Historical Provider, MD  calcitRIOL (ROCALTROL) 0.25 MCG capsule Take 1 capsule (0.25 mcg total) by mouth daily. 05/02/14  Yes Eugenie Filler, MD  dipyridamole-aspirin (AGGRENOX) 200-25 MG per 12 hr capsule Take 1 capsule by mouth 2 (two) times daily. Stop taking if any bleeding noted 06/26/14  Yes Lezlie Octave Black, NP  donepezil (ARICEPT) 10 MG tablet Take 10 mg by mouth at bedtime.  01/30/14  Yes Historical Provider, MD  folic acid (FOLVITE) 1 MG tablet Take 1 mg by mouth every morning.    Yes Historical Provider, MD  hydrALAZINE (APRESOLINE) 25 MG tablet Take 75 mg by mouth every 8 (eight) hours.   Yes Historical Provider, MD  insulin detemir (LEVEMIR) 100 UNIT/ML injection Inject 0.08 mLs (8 Units total) into the skin at bedtime. 05/02/14  Yes Eugenie Filler, MD  isosorbide mononitrate (IMDUR) 30 MG 24 hr tablet Take 0.5 mg by mouth every morning.   Yes Historical Provider, MD  levETIRAcetam (KEPPRA) 100 MG/ML solution Take 5 mLs by mouth 2 (two)  times daily. 01/13/14  Yes Historical Provider, MD  linagliptin (TRADJENTA) 5 MG TABS tablet Take 5 mg by mouth every morning.    Yes Historical Provider, MD  metoprolol succinate (TOPROL-XL) 100 MG 24 hr tablet Take 50 mg by mouth every morning. Take with or immediately following a meal.   Yes Historical Provider, MD  pantoprazole (PROTONIX) 40 MG tablet Take 40 mg by mouth every morning.   Yes Historical Provider, MD  potassium chloride (MICRO-K) 10 MEQ CR capsule Take 10 mEq by mouth See admin instructions. Alternate taking one Potassium daily with taking one Potassium twice daily with Torsemide as directed   Yes Historical Provider, MD  pravastatin (PRAVACHOL) 20 MG tablet Take 20 mg by mouth at bedtime.    Yes Historical Provider, MD  sodium bicarbonate 650 MG tablet Take 650 mg by mouth 3 (three) times daily.   Yes Historical Provider, MD  torsemide (DEMADEX) 100 MG tablet Take 0.5 tablets (50 mg total) by mouth daily. 05/02/14  Yes Eugenie Filler, MD  Vitamin D, Ergocalciferol, (DRISDOL) 50000 UNITS CAPS Take 50,000 Units by mouth every 7 (seven) days. Takes on Tuesday    Historical Provider, MD   Triage Vitals: BP 92/74  Pulse 78  Temp(Src) 97.5 F (36.4 C) (Oral)  Resp 18  Ht 5\' 5"  (1.651 m)  Wt 138 lb (62.596 kg)  BMI 22.96 kg/m2  SpO2 100%  Physical Exam  Nursing note and vitals reviewed. Constitutional: She appears well-developed and well-nourished. No distress.  Pt awake and pleasant.  HENT:  Head: Normocephalic and atraumatic.  mucosal membranes are slightly pale.   Eyes: Conjunctivae are normal. Right eye exhibits no discharge. Left eye exhibits no discharge.  Neck: Neck supple.  Cardiovascular: Normal rate, regular rhythm and normal heart sounds.  Exam reveals no gallop and no friction rub.   No murmur heard. Pulmonary/Chest: Effort normal and breath sounds normal. No respiratory distress.  Abdominal: Soft. She exhibits no distension. There is no tenderness.   Musculoskeletal: She exhibits no edema and no tenderness.  Neurological: She is alert.  Skin: Skin is warm and dry. There is pallor.  Psychiatric: She has a normal mood and affect. Her behavior is normal. Thought content normal.    ED Course  Procedures (including critical care time)  2:04 PM- Pt advised of plan for treatment and pt agrees.  Results for orders placed during the hospital encounter of 07/18/14  CBC WITH DIFFERENTIAL      Result Value Ref Range   WBC 5.9  4.0 - 10.5 K/uL   RBC 1.87 (*) 3.87 - 5.11 MIL/uL  Hemoglobin 5.1 (*) 12.0 - 15.0 g/dL   HCT 16.7 (*) 36.0 - 46.0 %   MCV 89.3  78.0 - 100.0 fL   MCH 27.3  26.0 - 34.0 pg   MCHC 30.5  30.0 - 36.0 g/dL   RDW 18.7 (*) 11.5 - 15.5 %   Platelets 318  150 - 400 K/uL   Neutrophils Relative % 72  43 - 77 %   Neutro Abs 4.2  1.7 - 7.7 K/uL   Lymphocytes Relative 20  12 - 46 %   Lymphs Abs 1.2  0.7 - 4.0 K/uL   Monocytes Relative 7  3 - 12 %   Monocytes Absolute 0.4  0.1 - 1.0 K/uL   Eosinophils Relative 1  0 - 5 %   Eosinophils Absolute 0.0  0.0 - 0.7 K/uL   Basophils Relative 0  0 - 1 %   Basophils Absolute 0.0  0.0 - 0.1 K/uL  BASIC METABOLIC PANEL      Result Value Ref Range   Sodium 143  137 - 147 mEq/L   Potassium 4.0  3.7 - 5.3 mEq/L   Chloride 105  96 - 112 mEq/L   CO2 22  19 - 32 mEq/L   Glucose, Bld 143 (*) 70 - 99 mg/dL   BUN 63 (*) 6 - 23 mg/dL   Creatinine, Ser 3.08 (*) 0.50 - 1.10 mg/dL   Calcium 8.5  8.4 - 10.5 mg/dL   GFR calc non Af Amer 14 (*) >90 mL/min   GFR calc Af Amer 16 (*) >90 mL/min   Anion gap 16 (*) 5 - 15  TROPONIN I      Result Value Ref Range   Troponin I <0.30  <0.30 ng/mL  TROPONIN I      Result Value Ref Range   Troponin I <0.30  <0.30 ng/mL  GLUCOSE, CAPILLARY      Result Value Ref Range   Glucose-Capillary 116 (*) 70 - 99 mg/dL  TYPE AND SCREEN      Result Value Ref Range   ABO/RH(D) A POS     Antibody Screen NEG     Sample Expiration 07/21/2014     Unit Number  [id number]     Blood Component Type RED CELLS,LR     Unit division 00     Status of Unit ISSUED     Transfusion Status OK TO TRANSFUSE     Crossmatch Result Compatible     Unit Number [id number]     Blood Component Type RED CELLS,LR     Unit division 00     Status of Unit ALLOCATED     Transfusion Status OK TO TRANSFUSE     Crossmatch Result Compatible     Unit Number [id number]     Blood Component Type RED CELLS,LR     Unit division 00     Status of Unit ALLOCATED     Transfusion Status OK TO TRANSFUSE     Crossmatch Result Compatible     Unit Number [id number]     Blood Component Type RED CELLS,LR     Unit division 00     Status of Unit ALLOCATED     Transfusion Status OK TO TRANSFUSE     Crossmatch Result Compatible    PREPARE RBC (CROSSMATCH)      Result Value Ref Range   Order Confirmation ORDER PROCESSED BY BLOOD BANK     No results found.    MDM   Final diagnoses:  Anemia, unspecified anemia type  Renal insufficiency    Patient sent in by her PCP for anemia. History of same with presumed GI cause. She's been unable to manage capsule endoscopy. Will admit to internal medicine.  I personally performed the services described in this documentation, which was scribed in my presence. The recorded information has been reviewed and is accurate.     Jasper Riling. Alvino Chapel, MD 07/18/14 2140

## 2014-07-18 NOTE — ED Notes (Signed)
Attempted to call report, nurse to call back. 

## 2014-07-18 NOTE — ED Notes (Signed)
Attempted to call report again, nurse to call back

## 2014-07-19 DIAGNOSIS — D62 Acute posthemorrhagic anemia: Secondary | ICD-10-CM

## 2014-07-19 DIAGNOSIS — D509 Iron deficiency anemia, unspecified: Secondary | ICD-10-CM

## 2014-07-19 LAB — CBC
HCT: 31.4 % — ABNORMAL LOW (ref 36.0–46.0)
Hemoglobin: 10.5 g/dL — ABNORMAL LOW (ref 12.0–15.0)
MCH: 29.1 pg (ref 26.0–34.0)
MCHC: 33.4 g/dL (ref 30.0–36.0)
MCV: 87 fL (ref 78.0–100.0)
Platelets: 270 10*3/uL (ref 150–400)
RBC: 3.61 MIL/uL — AB (ref 3.87–5.11)
RDW: 16.4 % — ABNORMAL HIGH (ref 11.5–15.5)
WBC: 6 10*3/uL (ref 4.0–10.5)

## 2014-07-19 LAB — GLUCOSE, CAPILLARY
GLUCOSE-CAPILLARY: 157 mg/dL — AB (ref 70–99)
Glucose-Capillary: 137 mg/dL — ABNORMAL HIGH (ref 70–99)
Glucose-Capillary: 132 mg/dL — ABNORMAL HIGH (ref 70–99)
Glucose-Capillary: 221 mg/dL — ABNORMAL HIGH (ref 70–99)

## 2014-07-19 LAB — COMPREHENSIVE METABOLIC PANEL
ALK PHOS: 75 U/L (ref 39–117)
ALT: 15 U/L (ref 0–35)
AST: 29 U/L (ref 0–37)
Albumin: 3.1 g/dL — ABNORMAL LOW (ref 3.5–5.2)
Anion gap: 13 (ref 5–15)
BUN: 59 mg/dL — ABNORMAL HIGH (ref 6–23)
CALCIUM: 8.5 mg/dL (ref 8.4–10.5)
CO2: 22 meq/L (ref 19–32)
Chloride: 109 mEq/L (ref 96–112)
Creatinine, Ser: 2.65 mg/dL — ABNORMAL HIGH (ref 0.50–1.10)
GFR calc non Af Amer: 17 mL/min — ABNORMAL LOW (ref >90)
GFR, EST AFRICAN AMERICAN: 19 mL/min — AB (ref >90)
Glucose, Bld: 154 mg/dL — ABNORMAL HIGH (ref 70–99)
Potassium: 4 mEq/L (ref 3.7–5.3)
SODIUM: 144 meq/L (ref 137–147)
TOTAL PROTEIN: 8.8 g/dL — AB (ref 6.0–8.3)
Total Bilirubin: 1.3 mg/dL — ABNORMAL HIGH (ref 0.3–1.2)

## 2014-07-19 LAB — TROPONIN I

## 2014-07-19 MED ORDER — FUROSEMIDE 10 MG/ML IJ SOLN
20.0000 mg | Freq: Once | INTRAMUSCULAR | Status: AC
  Administered 2014-07-19: 20 mg via INTRAVENOUS
  Filled 2014-07-19: qty 2

## 2014-07-19 MED ORDER — DOCUSATE SODIUM 100 MG PO CAPS
200.0000 mg | ORAL_CAPSULE | Freq: Every day | ORAL | Status: DC
  Administered 2014-07-19 – 2014-07-21 (×3): 200 mg via ORAL
  Filled 2014-07-19 (×3): qty 2

## 2014-07-19 MED ORDER — SODIUM CHLORIDE 0.9 % IV SOLN: INTRAVENOUS | Status: DC

## 2014-07-19 NOTE — Care Management Note (Addendum)
    Page 1 of 2   07/21/2014     1:58:07 PM CARE MANAGEMENT NOTE 07/21/2014  Patient:  [name], [name]   Account Number:  [id number]  Date Initiated:  07/19/2014  Documentation initiated by:  Theophilus Kinds  Subjective/Objective Assessment:   Pt admitted from home with GI bleed/anemia. Pt lives with her daughter and will return home at discharge. Pt is active with [facility] RN and has a walker and home O2.     Action/Plan:   Will arrange resumption of [facility] at discharge. Will continue to follow for discharge planning needs.   Anticipated DC Date:  07/22/2014   Anticipated DC Plan:  [place]  CM consult      [facility] Choice  Resumption Of Svcs/PTA Provider   Choice offered to / List presented to:  [name]        [place] arranged  HH-1 RN      [place].   Status of service:  Completed, signed off Medicare Important Message given?  YES (If response is "NO", the following Medicare IM given date fields will be blank) Date Medicare IM given:  07/21/2014 Medicare IM given by:  Theophilus Kinds Date Additional Medicare IM given:   Additional Medicare IM given by:    Discharge Disposition:  [place]  Per UR Regulation:    If discussed at Long Length of Stay Meetings, dates discussed:    Comments:  07/21/14 [place], RN BSN CM Pt potential discharge over the weekend. Pts home health RN arranged with [facility] (per pts daughter choice). [name] of [facility] is aware and will collect the pts information from the chart. [place] services to resume within 48 hours of discharge. Weekend staff to call and fax orders to [facility] when discharged. Pts daughter is aware of discharge arrangements.  07/19/14 [place], RN BSN CM

## 2014-07-19 NOTE — Progress Notes (Signed)
UR chart review completed.  

## 2014-07-19 NOTE — Progress Notes (Signed)
[facility] PROGRESS NOTE  HA [name] VEH:[id number] DOB: [birth year] DOA: 07/18/2014 PCP: [name], MD   Brief narrative 73 year old female with dementia, type 2 diabetes mellitus, history of stroke, NSTEMI, seizure disorders, CAD S/p CABG, ischemic cardiomyopathy, iron deficiency anemia with colonoscopy done one month back and plan for outpatient capsule study (as she could not tolerate it as inpatient) was brought into the ED as she was progressively weaker and blood work done by home health for her to be severely anemic. [place] in the ED was 5.1. Patient admitted to telemetry and transfused with 3 units PRBC.  Assessment/Plan: Severe iron deficiency anemia Possible GI bleed with no clear source identified so far. EGD done in February this year showed a spider the gastritis. A colonoscopy done 3 weeks back during hospitalization for severe anemia showed 2 small polyps. She could not tolerate capsule study as inpatient and was scheduled as outpatient for next week. -3 units PRBC given. Subsequent hemoglobin improved to 10.5 this morning. -Hold Aggrenox. -Monitor serial H&H. GI consulted and will follow up with recommendations. Possibly perform capsule endoscopy inpatient. -Continue PPI  Acute on chronic kidney disease Possibly prerenal in the involved dehydration. Monitor with gentle IV fluids. Will hold torsemide. Monitor renal function in the a.m. Monitor I/O.  Diabetes mellitus type 2 Continue home dose of Levemir insulin and sliding scale insulin.  Coronary artery disease Continue iimdur, Toprol, and statin  Ischemic cardiomyopathy with diastolic dysfunction Appears euvolemic currently. Holding torsemide. Monitor I/O   Dementia Continue Aricept  DVT prophylaxis: SCDs Diet: Carb modified    Code Status: Full code Family Communication: None at bedside. Will inform daughter  Disposition Plan: Once improved and workup  completed   Consultants:  GI  Procedures:  None  Antibiotics:  None  HPI/Subjective: Patient seen and examined this afternoon. Denies any specific symptoms. Unable to provide much history due to dementia.  Objective: Filed Vitals:   07/19/14 1432  BP: 152/58  Pulse: 85  Temp: 98.6 F (37 C)  Resp: 20    Intake/Output Summary (Last 24 hours) at 07/19/14 1443 Last data filed at 07/19/14 1207  Gross per 24 hour  Intake 1264.17 ml  Output   1600 ml  Net -335.83 ml   Filed Weights   07/18/14 1348  Weight: 62.596 kg (138 lb)    Exam:   General:  Elderly female lying in bed in no acute distress  HEENT: Pallor present, moist oral mucosa  Cardiovascular: Normal S1 and S2, no murmurs  Respiratory: Clear bilaterally, no added sounds  Abdomen: Soft, nontender, nondistended, bowel sounds present  Musculoskeletal: Warm, no edema  CNS: AAO x 1-2   Data Reviewed: Basic Metabolic Panel:  Recent Labs Lab 07/18/14 1432 07/19/14 0857  NA 143 144  K 4.0 4.0  CL 105 109  CO2 22 22  GLUCOSE 143* 154*  BUN 63* 59*  CREATININE 3.08* 2.65*  CALCIUM 8.5 8.5   Liver Function Tests:  Recent Labs Lab 07/19/14 0857  AST 29  ALT 15  ALKPHOS 75  BILITOT 1.3*  PROT 8.8*  ALBUMIN 3.1*   No results found for this basename: LIPASE, AMYLASE,  in the last 168 hours No results found for this basename: AMMONIA,  in the last 168 hours CBC:  Recent Labs Lab 07/18/14 1432 07/19/14 0857  WBC 5.9 6.0  NEUTROABS 4.2  --   HGB 5.1* 10.5*  HCT 16.7* 31.4*  MCV 89.3 87.0  PLT 318 270   Cardiac Enzymes:  Recent Labs Lab 07/18/14 1432 07/18/14 1654 07/18/14 2239 07/19/14 0857  TROPONINI <0.30 <0.30 <0.30 <0.30   BNP (last 3 results)  Recent Labs  03/15/14 1645 04/27/14 1536 06/09/14 1021  PROBNP 37134.0* 51631.0* 30358.0*   CBG:  Recent Labs Lab 07/18/14 1813 07/18/14 2207 07/19/14 0804 07/19/14 1108  GLUCAP 116* 156* 137* 157*    No  results found for this or any previous visit (from the past 240 hour(s)).   Studies: No results found.  Scheduled Meds: . sodium chloride   Intravenous Once  . calcitRIOL  0.25 mcg Oral Daily  . donepezil  10 mg Oral QHS  . folic acid  1 mg Oral q morning - 10a  . hydrALAZINE  75 mg Oral 3 times per day  . insulin aspart  0-5 Units Subcutaneous QHS  . insulin aspart  0-9 Units Subcutaneous TID WC  . insulin detemir  8 Units Subcutaneous QHS  . isosorbide mononitrate  15 mg Oral q morning - 10a  . levETIRAcetam  500 mg Oral BID  . linagliptin  5 mg Oral q morning - 10a  . metoprolol succinate  50 mg Oral q morning - 10a  . pantoprazole (PROTONIX) IV  40 mg Intravenous Q24H  . potassium chloride  10 mEq Oral Daily  . simvastatin  10 mg Oral q1800  . sodium bicarbonate  650 mg Oral TID  . sodium chloride  3 mL Intravenous Q12H  . [START ON 07/25/2014] Vitamin D (Ergocalciferol)  50,000 Units Oral Q Tue   Continuous Infusions: . sodium chloride 75 mL/hr at 07/19/14 0805      Time spent: 25 minutes    Kolyn Rozario  [facility] Pager [phone] If 7PM-7AM, please contact night-coverage at [email or web address], password [facility] 07/19/2014, 2:43 PM  LOS: 1 day

## 2014-07-19 NOTE — Telephone Encounter (Signed)
PATIENT SCHEDULED FOR THAT DAY

## 2014-07-19 NOTE — Consult Note (Signed)
Reason for Consult:GI bleed Referring Physician: Hospitalist  [name] is an 73 y.o. female. Patient is somewhat confused.  Per records patient has been weak. She apparently had blood work by [employer] which showed she was anemic again.  Hx of severe anemia in the past. She underwent a colonoscopy by Dr.Rehman 1st of this month for GI bleed and anemia which did not reveal a bleeding lesion.  EGD in February (see below).  Admission hemoglobin 5.1. She has received 3 units of PRBCs. Post CBC is pending. Recently admitted in July for IDA. Underwent a colonoscopy by Dr. Laural Golden.  Patient tells me this am she has been passing black stools at home. There has been no nausea or vomiting. She says her appetite has been good.  NSTEMI 12/2013. Fecal occults have been negative except for one in February.  Home Medications include ASA and Aggrenox .Colonoscopy 06/25/2014: Dr.Rehman   Indications: Patient is a 73 year old African female with multiple medical problems including history of GI bleed with anemia and need for transfusion. She had EGD back in February this year and has been treated for H. pylori gastritis. She suffered non-STEMI last month felt to be secondary to mismatch and has been cleared by cardiology for conscious sedation. Impression:  Examination performed to cecum.  8 mm polyp hot snared from ascending colon.  Another small polyp at ascending colon was correlated using snare tip.  Pan colonic diverticulosis. Most of the diverticula are located at sigmoid colon. Biopsy: Tubular adenoma. Comment;  Bleeding lesion not identified. She may have bled from colonic diverticulosis.   She will need given capsule study to complete her workup.    01/18/2014 EGD: GI bleed: Dr. Gala Romney: Profound IDA Small hiatal hernia. Abnormal gastric and duodenal bulbar mucosa of uncertain significance. Status post gastric biopsy.   Biopsy: Chronic, active gastritis with H. Pylori. (Treated with  Prevpac)    Past Medical History  Diagnosis Date  . Type 2 diabetes mellitus   . Essential hypertension, benign   . History of stroke     Previously on Coumadin  . MI, old     Reported 63  . Seizure disorder   . Coronary atherosclerosis of native coronary artery     Multivessel status post CABG 2002  . History of GI bleed     Erosive gastritis and duodenitis by EGD 2002  . Mixed hyperlipidemia   . Ischemic cardiomyopathy     LVEF 40-45% March 2013  . Dementia   . Stroke   . Seizures   . CKD (chronic kidney disease) stage 4, GFR 15-29 ml/min   . CHF (congestive heart failure)     Past Surgical History  Procedure Laterality Date  . Coronary artery bypass graft  July 2002    LIMA to LAD, SVG to OM1, SVG to OM 2, SVG to RCA  . Tee without cardioversion  01/28/2012    Procedure: TRANSESOPHAGEAL ECHOCARDIOGRAM (TEE);  Surgeon: Lelon Perla, MD;  Location: [facility] ENDOSCOPY;  Service: Cardiovascular;  Laterality: [name];  . Colonoscopy  2002  . Esophagogastroduodenoscopy  2002  . Esophagogastroduodenoscopy [name] 01/18/2014    DGU:YQIHK hiatal hernia. Abnormal gastric and duodenal bulbar mucosa of uncertain significance - status post gastric bx (chronic gastritis/H.pylori +  . Colonoscopy [name] 06/25/2014    Procedure: COLONOSCOPY;  Surgeon: Rogene Houston, MD;  Location: [facility] ENDO SUITE;  Service: Endoscopy;  Laterality: [name];  . Agile capsule [name] 07/06/2014    Procedure: AGILE CAPSULE;  Surgeon: Marga Melnick  Fields, MD;  Location: [facility] ENDO SUITE;  Service: Endoscopy;  Laterality: [name];  730    Family History  Problem Relation Age of Onset  . Stroke Father   . Diabetes type II Mother   . Colon cancer Neg Hx     Social History:  reports that she has quit smoking. She does not have any smokeless tobacco history on file. She reports that she does not drink alcohol or use illicit drugs.  Allergies: No Known Allergies  Medications: I have reviewed the patient's current medications.  Results  for orders placed during the hospital encounter of 07/18/14 (from the past 48 hour(s))  CBC WITH DIFFERENTIAL     Status: Abnormal   Collection Time    07/18/14  2:32 PM      Result Value Ref Range   WBC 5.9  4.0 - 10.5 K/uL   RBC 1.87 (*) 3.87 - 5.11 MIL/uL   Hemoglobin 5.1 (*) 12.0 - 15.0 g/dL   Comment: CRITICAL RESULT CALLED TO, READ BACK BY AND VERIFIED WITH:     HONEYCUTT,S. AT 1515 ON 07/18/2014 BY BAUGHAM,M.   HCT 16.7 (*) 36.0 - 46.0 %   Comment: RESULT REPEATED AND VERIFIED   MCV 89.3  78.0 - 100.0 fL   MCH 27.3  26.0 - 34.0 pg   MCHC 30.5  30.0 - 36.0 g/dL   RDW 18.7 (*) 11.5 - 15.5 %   Platelets 318  150 - 400 K/uL   Neutrophils Relative % 72  43 - 77 %   Neutro Abs 4.2  1.7 - 7.7 K/uL   Lymphocytes Relative 20  12 - 46 %   Lymphs Abs 1.2  0.7 - 4.0 K/uL   Monocytes Relative 7  3 - 12 %   Monocytes Absolute 0.4  0.1 - 1.0 K/uL   Eosinophils Relative 1  0 - 5 %   Eosinophils Absolute 0.0  0.0 - 0.7 K/uL   Basophils Relative 0  0 - 1 %   Basophils Absolute 0.0  0.0 - 0.1 K/uL  TYPE AND SCREEN     Status: None   Collection Time    07/18/14  2:32 PM      Result Value Ref Range   ABO/RH(D) A POS     Antibody Screen NEG     Sample Expiration 07/21/2014     Unit Number [id number]     Blood Component Type RED CELLS,LR     Unit division 00     Status of Unit ISSUED     Transfusion Status OK TO TRANSFUSE     Crossmatch Result Compatible     Unit Number [id number]     Blood Component Type RED CELLS,LR     Unit division 00     Status of Unit ISSUED     Transfusion Status OK TO TRANSFUSE     Crossmatch Result Compatible     Unit Number [id number]     Blood Component Type RED CELLS,LR     Unit division 00     Status of Unit ISSUED     Transfusion Status OK TO TRANSFUSE     Crossmatch Result Compatible     Unit Number [id number]     Blood Component Type RED CELLS,LR     Unit division 00     Status of Unit ALLOCATED     Transfusion Status OK TO  TRANSFUSE     Crossmatch Result Compatible    BASIC METABOLIC PANEL  Status: Abnormal   Collection Time    07/18/14  2:32 PM      Result Value Ref Range   Sodium 143  137 - 147 mEq/L   Potassium 4.0  3.7 - 5.3 mEq/L   Chloride 105  96 - 112 mEq/L   CO2 22  19 - 32 mEq/L   Glucose, Bld 143 (*) 70 - 99 mg/dL   BUN 63 (*) 6 - 23 mg/dL   Creatinine, Ser 3.08 (*) 0.50 - 1.10 mg/dL   Calcium 8.5  8.4 - 10.5 mg/dL   GFR calc non Af Amer 14 (*) >90 mL/min   GFR calc Af Amer 16 (*) >90 mL/min   Comment: (NOTE)     The eGFR has been calculated using the CKD EPI equation.     This calculation has not been validated in all clinical situations.     eGFR's persistently <90 mL/min signify possible Chronic Kidney     Disease.   Anion gap 16 (*) 5 - 15  TROPONIN I     Status: None   Collection Time    07/18/14  2:32 PM      Result Value Ref Range   Troponin I <0.30  <0.30 ng/mL   Comment:            Due to the release kinetics of cTnI,     a negative result within the first hours     of the onset of symptoms does not rule out     myocardial infarction with certainty.     If myocardial infarction is still suspected,     repeat the test at appropriate intervals.  PREPARE RBC (CROSSMATCH)     Status: None   Collection Time    07/18/14  2:32 PM      Result Value Ref Range   Order Confirmation ORDER PROCESSED BY BLOOD BANK    TROPONIN I     Status: None   Collection Time    07/18/14  4:54 PM      Result Value Ref Range   Troponin I <0.30  <0.30 ng/mL   Comment:            Due to the release kinetics of cTnI,     a negative result within the first hours     of the onset of symptoms does not rule out     myocardial infarction with certainty.     If myocardial infarction is still suspected,     repeat the test at appropriate intervals.  GLUCOSE, CAPILLARY     Status: Abnormal   Collection Time    07/18/14  6:13 PM      Result Value Ref Range   Glucose-Capillary 116 (*) 70 - 99 mg/dL   GLUCOSE, CAPILLARY     Status: Abnormal   Collection Time    07/18/14 10:07 PM      Result Value Ref Range   Glucose-Capillary 156 (*) 70 - 99 mg/dL  TROPONIN I     Status: None   Collection Time    07/18/14 10:39 PM      Result Value Ref Range   Troponin I <0.30  <0.30 ng/mL   Comment:            Due to the release kinetics of cTnI,     a negative result within the first hours     of the onset of symptoms does not rule out     myocardial infarction with  certainty.     If myocardial infarction is still suspected,     repeat the test at appropriate intervals.  GLUCOSE, CAPILLARY     Status: Abnormal   Collection Time    07/19/14  8:04 AM      Result Value Ref Range   Glucose-Capillary 137 (*) 70 - 99 mg/dL    No results found.  ROS Blood pressure 153/73, pulse 83, temperature 98.4 F (36.9 C), temperature source Oral, resp. rate 18, height _0  (1.651 m), weight 138 lb (62.596 kg), SpO2 100.00%. Physical Exam   Alert. Skin warm and dry. Oral mucosa is moist.   . Sclera anicteric, conjunctivae is pink. Thyroid not enlarged. No cervical lymphadenopathy. Lungs clear. Heart regular rate and rhythm.  Abdomen is soft. Bowel sounds are positive. No hepatomegaly. No abdominal masses felt. No tenderness.  No edema to lower extremities       Assessment/Plan Iron deficiency anemia. Source of blood loss has not found.  Will discuss with Dr. Laural Golden.   SETZER,TERRI W 07/19/2014, 8:22 AM    GI attending note; Patient interviewed and examined; condition reviewed with patient's daughter [name]. Patient is 70 year old African American female with multiple medical problems who is on low-dose aspirin along with Aggrenox who presents with profound anemia and history of melena. She has been evaluated with EGD back in February this year by Dr. Gala Romney and colonoscopy by me 3 weeks ago while covering for Dr. Oneida Alar. Patient was to undergo small bowel given capsule study but she was unable to  swallow the capsule and therefore study could not be completed. Patient presents again with hemoglobin of 5.1 g. She denies rectal bleeding abdominal pain nausea or vomiting. She does give history of passing black stools. Patient has received 3 units of PRBCs and feels better. Patient's abdominal exam is within normal limits. Patient needs to be further evaluated with small bowel given capsule study hoping to find out the source of recurrent GI bleed. Patient and [name] are agreeable to proceed with further evaluation. Condition also discussed with Dr. Oneida Alar. She is not hospital-based tomorrow, she would like for Dr. Gala Romney or myself to proceed with endoscopic placement of given capsule which would be planned for 07/20/2014.

## 2014-07-20 ENCOUNTER — Encounter (HOSPITAL_COMMUNITY)
Admission: EM | Disposition: A | Discharge status: Home Health | Payer: Self-pay | Source: Home / Self Care | Attending: Internal Medicine

## 2014-07-20 ENCOUNTER — Encounter (HOSPITAL_COMMUNITY): Payer: Self-pay | Admitting: *Deleted

## 2014-07-20 DIAGNOSIS — I255 Ischemic cardiomyopathy: Secondary | ICD-10-CM | POA: Diagnosis present

## 2014-07-20 HISTORY — PX: ESOPHAGOGASTRODUODENOSCOPY: SHX5428

## 2014-07-20 HISTORY — PX: GIVENS CAPSULE STUDY: SHX5432

## 2014-07-20 LAB — CBC
HCT: 27.5 % — ABNORMAL LOW (ref 36.0–46.0)
Hemoglobin: 8.9 g/dL — ABNORMAL LOW (ref 12.0–15.0)
MCH: 28.1 pg (ref 26.0–34.0)
MCHC: 32.4 g/dL (ref 30.0–36.0)
MCV: 86.8 fL (ref 78.0–100.0)
PLATELETS: 266 10*3/uL (ref 150–400)
RBC: 3.17 MIL/uL — AB (ref 3.87–5.11)
RDW: 17.3 % — ABNORMAL HIGH (ref 11.5–15.5)
WBC: 5.2 10*3/uL (ref 4.0–10.5)

## 2014-07-20 LAB — BASIC METABOLIC PANEL
Anion gap: 11 (ref 5–15)
BUN: 51 mg/dL — ABNORMAL HIGH (ref 6–23)
CHLORIDE: 113 meq/L — AB (ref 96–112)
CO2: 21 mEq/L (ref 19–32)
Calcium: 8.4 mg/dL (ref 8.4–10.5)
Creatinine, Ser: 2.36 mg/dL — ABNORMAL HIGH (ref 0.50–1.10)
GFR calc non Af Amer: 19 mL/min — ABNORMAL LOW (ref >90)
GFR, EST AFRICAN AMERICAN: 22 mL/min — AB (ref >90)
Glucose, Bld: 114 mg/dL — ABNORMAL HIGH (ref 70–99)
POTASSIUM: 3.9 meq/L (ref 3.7–5.3)
Sodium: 145 mEq/L (ref 137–147)

## 2014-07-20 LAB — GLUCOSE, CAPILLARY
GLUCOSE-CAPILLARY: 89 mg/dL (ref 70–99)
Glucose-Capillary: 135 mg/dL — ABNORMAL HIGH (ref 70–99)
Glucose-Capillary: 101 mg/dL — ABNORMAL HIGH (ref 70–99)
Glucose-Capillary: 89 mg/dL (ref 70–99)
Glucose-Capillary: 89 mg/dL (ref 70–99)

## 2014-07-20 SURGERY — EGD (ESOPHAGOGASTRODUODENOSCOPY)
Anesthesia: Moderate Sedation

## 2014-07-20 MED ORDER — LIDOCAINE VISCOUS 2 % MT SOLN
OROMUCOSAL | Status: DC | PRN
  Administered 2014-07-20: 4 mL via OROMUCOSAL

## 2014-07-20 MED ORDER — STERILE WATER FOR IRRIGATION IR SOLN
Status: DC | PRN
  Administered 2014-07-20: 16:00:00

## 2014-07-20 MED ORDER — SODIUM CHLORIDE 0.9 % IV SOLN: INTRAVENOUS | Status: DC

## 2014-07-20 MED ORDER — LINAGLIPTIN 5 MG PO TABS
5.0000 mg | ORAL_TABLET | Freq: Every morning | ORAL | Status: DC
  Administered 2014-07-20 – 2014-07-22 (×2): 5 mg via ORAL
  Filled 2014-07-20 (×2): qty 1

## 2014-07-20 MED ORDER — MEPERIDINE HCL 100 MG/ML IJ SOLN
INTRAMUSCULAR | Status: AC
  Filled 2014-07-20: qty 2

## 2014-07-20 MED ORDER — ISOSORBIDE MONONITRATE ER 30 MG PO TB24
15.0000 mg | ORAL_TABLET | Freq: Every morning | ORAL | Status: DC
  Administered 2014-07-20 – 2014-07-22 (×2): 15 mg via ORAL
  Filled 2014-07-20 (×2): qty 1

## 2014-07-20 MED ORDER — SIMVASTATIN 10 MG PO TABS
10.0000 mg | ORAL_TABLET | Freq: Every day | ORAL | Status: DC
  Administered 2014-07-20 – 2014-07-21 (×2): 10 mg via ORAL
  Filled 2014-07-20 (×2): qty 1

## 2014-07-20 MED ORDER — METOPROLOL SUCCINATE ER 50 MG PO TB24
50.0000 mg | ORAL_TABLET | Freq: Every morning | ORAL | Status: DC
  Administered 2014-07-20 – 2014-07-22 (×2): 50 mg via ORAL
  Filled 2014-07-20 (×2): qty 1

## 2014-07-20 MED ORDER — LIDOCAINE VISCOUS 2 % MT SOLN
OROMUCOSAL | Status: AC
  Filled 2014-07-20: qty 15

## 2014-07-20 MED ORDER — MEPERIDINE HCL 100 MG/ML IJ SOLN
INTRAMUSCULAR | Status: DC | PRN
  Administered 2014-07-20 (×2): 25 mg via INTRAVENOUS

## 2014-07-20 MED ORDER — MIDAZOLAM HCL 5 MG/5ML IJ SOLN
INTRAMUSCULAR | Status: DC | PRN
  Administered 2014-07-20 (×2): 1 mg via INTRAVENOUS

## 2014-07-20 MED ORDER — MIDAZOLAM HCL 5 MG/5ML IJ SOLN
INTRAMUSCULAR | Status: AC
  Filled 2014-07-20: qty 10

## 2014-07-20 MED ORDER — POTASSIUM CHLORIDE CRYS ER 10 MEQ PO TBCR
10.0000 meq | EXTENDED_RELEASE_TABLET | Freq: Every day | ORAL | Status: DC
  Administered 2014-07-20 – 2014-07-22 (×3): 10 meq via ORAL
  Filled 2014-07-20 (×3): qty 1

## 2014-07-20 MED ORDER — CALCITRIOL 0.25 MCG PO CAPS
0.2500 ug | ORAL_CAPSULE | Freq: Every day | ORAL | Status: DC
  Administered 2014-07-20 – 2014-07-22 (×3): 0.25 ug via ORAL
  Filled 2014-07-20 (×3): qty 1

## 2014-07-20 MED ORDER — FOLIC ACID 1 MG PO TABS
1.0000 mg | ORAL_TABLET | Freq: Every morning | ORAL | Status: DC
  Administered 2014-07-20 – 2014-07-22 (×2): 1 mg via ORAL
  Filled 2014-07-20 (×2): qty 1

## 2014-07-20 MED ORDER — ONDANSETRON HCL 4 MG/2ML IJ SOLN
INTRAMUSCULAR | Status: DC | PRN
  Administered 2014-07-20: 4 mg via INTRAVENOUS

## 2014-07-20 MED ORDER — ONDANSETRON HCL 4 MG/2ML IJ SOLN
INTRAMUSCULAR | Status: AC
  Filled 2014-07-20: qty 2

## 2014-07-20 NOTE — Op Note (Addendum)
[facility] [address] [place], [address]   ENDOSCOPY PROCEDURE REPORT  PATIENT: [name], [name]  MR#: [id number] BIRTHDATE: 1941/06/28 , 73  yrs. old GENDER: Female ENDOSCOPIST: Bridgette Habermann, MD FACP [facility] REFERRED BY: PROCEDURE DATE:  07/20/2014 PROCEDURE:  INDICATIONS:  INFORMED CONSENT:   The risks, benefits, limitations, alternatives and imponderables have been discussed.  The potential for biopsy, esophogeal dilation, etc. have also been reviewed.  Questions have been answered.  All parties agreeable.  Please see the history and physical in the medical record for more information.  MEDICATIONS:  DESCRIPTION OF PROCEDURE:   The EG-[id number] ([id number])  endoscope was introduced through the mouth and advanced to the second portion of the duodenum without difficulty or limitations.  The mucosal surfaces were surveyed very carefully during advancement of the scope and upon withdrawal.  Retroflexion view of the proximal stomach and esophagogastric junction was performed.      FINDINGS: Undulating Z line; otherwise normal esophagus. Stomach empty. Small hiatal hernia. Couple of 1-3 mm benign hyperplastic polyps in the antrum and  the cardia. No ulcer or infiltrating process. No bleeding lesions seen. Patent pylorus. Examination of the bulb and second portion revealed some fine pigmentation of the duodenal bulb, otherwise, the duodenum through the second portion appeared normal.  THERAPEUTIC / DIAGNOSTIC MANEUVERS PERFORMED:  Scope was removed and thesmall bowel video capsule deployment device was loaded through the scope with a capsule afixed. The scope was reintroduced into the stomach and the capsule was launched into the duodenum under endoscopic control without difficulty or apparent complication.   COMPLICATIONS:  None  IMPRESSION:    Tiny gastric polyps of doubtful clinical significance - not manipulated. Some minimal pigmentation of the  duodenal bulbar of doubtful clinical significance - status post placement of small bowel video capsule endoscopically as described above.    RECOMMENDATIONS:   Capsule images to be reviewed tomorrow as they become available.  Findings and recommendations discussed with patient's daughter in the endoscopy Department    _______________________________ R. Garfield Cornea, MD FACP [facility] eSigned:  R. Garfield Cornea, MD FACP [facility] 07/20/2014 4:15 PM     CC:  PATIENT NAME:  [name], [name] MR#: [id number]

## 2014-07-20 NOTE — Progress Notes (Signed)
Patient's CBG was 89 tonight. MD notified on whether to give or hold Levemir 8 units. Order given to give the Levemir. Will continue to monitor.

## 2014-07-20 NOTE — Progress Notes (Signed)
Patient and family is aware that patient can have nothing by mouth until 8:00 PM tonight.  At 8:00 PM, patient can only have clear, colorless liquids.  Pharmacy had been notified and meds have been reschedule.  Will inform oncoming nurse of patient's diet and that [facility] is to come remove the box and create the video at 12:30AM.    Will continue to monitor patient.

## 2014-07-20 NOTE — Progress Notes (Signed)
Spoke with Dr. Laural Golden who has requested Dr. Gala Romney take over care of patient today (covering for primary gastroenterologist, Dr. Oneida Alar). Plans for EGD with Givens capsule placement today. I have spoken with the patient who is mildly demented, she is agreeable. I have tried to contact daughter, [name], to let her know Dr. Gala Romney will be performing procedure today at the following numbers with no answer [phone], [phone]). Called home number [phone] and spoke with Vicki's daughter and advised what was going on. She states [name] was planning on coming to hospital later this morning.

## 2014-07-20 NOTE — Clinical Documentation Improvement (Addendum)
Chart documentation reflects "acute on chronic kidney disease" and "CKD stage 4".   Please document if a condition below provides greater specificity regarding 'acute on chronic kidney disease'.    -Acute kidney injury on CKD stage 4 -Other Condition -Cannot Clinically Determine   Thank you, Mateo Flow, RN [phone] Clinical Documentation Specialist

## 2014-07-20 NOTE — Clinical Documentation Improvement (Addendum)
"  Ischemic cardiomyopathy with diastolic dysfunction" documented in chart.  Please refer to echo report July 2015.    Please document if a condition below provides greater specificity regarding the pt's ischemic cardiomyopathy with diastolic dysfunction.  -Chronic diastolic heart failure (appears euvolemic) -Other condition -Unable to determine   Thank you, Mateo Flow, RN [phone] Clinical Documentation Specialist

## 2014-07-20 NOTE — Progress Notes (Signed)
[facility] PROGRESS NOTE  [name] TIR:[id number] DOB: Mar 18, 1941 DOA: 07/18/2014 PCP: [name], MD   Brief narrative 73 year old female with dementia, type 2 diabetes mellitus, history of stroke, NSTEMI, seizure disorders, CAD S/p CABG, ischemic cardiomyopathy, iron deficiency anemia with colonoscopy done one month back and plan for outpatient capsule study (as she could not tolerate it as inpatient) was brought into the ED as she was progressively weaker and blood work done by home health for her to be severely anemic. Houghtaling in the ED was 5.1. Patient admitted to telemetry and transfused with 3 units PRBC.  Assessment/Plan: Severe iron deficiency anemia Possible GI bleed with no clear source identified so far. EGD done in February this year showed a spider the gastritis. A colonoscopy done 3 weeks back during hospitalization for severe anemia showed 2 small polyps. She could not tolerate capsule study as inpatient and was scheduled as outpatient for next week. -3 units PRBC given. Hemoglobin now 8.9. -Hold Aggrenox. If capsule study negative ,  would need to be continued permanently. -Monitor serial H&H. GI plan on inpatient capsule study -Continue PPI  Acute on chronic kidney disease Possibly prerenal in the involved dehydration. Slowly improving with IV fluids. Will hold torsemide. Monitor renal function daily. Monitor I/O.  Diabetes mellitus type 2 Continue home dose of Levemir insulin and sliding scale insulin.  Coronary artery disease Continue iimdur, Toprol, and statin  Ischemic cardiomyopathy with diastolic dysfunction Appears euvolemic currently. Holding torsemide. Monitor I/O  History of CVA Holding Aggrenox. Continue statin  Dementia Continue Aricept  History of seizures Continue Keppra  DVT prophylaxis: SCDs Diet: Carb modified    Code Status: Full code Family Communication: None at bedside. Will call daughter Disposition Plan: Once  improved and workup completed   Consultants:  GI  Procedures:  None  Antibiotics:  None  HPI/Subjective: Pt seen and examined. Denies any specific symptoms   Objective: Filed Vitals:   07/20/14 0959  BP: 147/70  Pulse: 81  Temp:   Resp:     Intake/Output Summary (Last 24 hours) at 07/20/14 1312 Last data filed at 07/20/14 0901  Gross per 24 hour  Intake 1118.67 ml  Output    125 ml  Net 993.67 ml   Filed Weights   07/18/14 1348 07/20/14 0527  Weight: 62.596 kg (138 lb) 63.594 kg (140 lb 3.2 oz)    Exam:   General:  no acute distress  HEENT: Pallor present, moist oral mucosa  Cardiovascular: Normal S1 and S2, no murmurs  Respiratory: Clear bilaterally, no added sounds  Abdomen: Soft, nontender, nondistended, bowel sounds present  Musculoskeletal: Warm, no edema  CNS: AAO x 1-2   Data Reviewed: Basic Metabolic Panel:  Recent Labs Lab 07/18/14 1432 07/19/14 0857 07/20/14 0611  NA 143 144 145  K 4.0 4.0 3.9  CL 105 109 113*  CO2 22 22 21   GLUCOSE 143* 154* 114*  BUN 63* 59* 51*  CREATININE 3.08* 2.65* 2.36*  CALCIUM 8.5 8.5 8.4   Liver Function Tests:  Recent Labs Lab 07/19/14 0857  AST 29  ALT 15  ALKPHOS 75  BILITOT 1.3*  PROT 8.8*  ALBUMIN 3.1*   No results found for this basename: LIPASE, AMYLASE,  in the last 168 hours No results found for this basename: AMMONIA,  in the last 168 hours CBC:  Recent Labs Lab 07/18/14 1432 07/19/14 0857 07/20/14 0611  WBC 5.9 6.0 5.2  NEUTROABS 4.2  --   --   HGB 5.1*  10.5* 8.9*  HCT 16.7* 31.4* 27.5*  MCV 89.3 87.0 86.8  PLT 318 270 266   Cardiac Enzymes:  Recent Labs Lab 07/18/14 1432 07/18/14 1654 07/18/14 2239 07/19/14 0857  TROPONINI <0.30 <0.30 <0.30 <0.30   BNP (last 3 results)  Recent Labs  03/15/14 1645 04/27/14 1536 06/09/14 1021  PROBNP 37134.0* 51631.0* 30358.0*   CBG:  Recent Labs Lab 07/19/14 1108 07/19/14 1632 07/19/14 2117 07/20/14 0754  07/20/14 1130  GLUCAP 157* 132* 221* 135* 101*    No results found for this or any previous visit (from the past 240 hour(s)).   Studies: No results found.  Scheduled Meds: . sodium chloride   Intravenous Once  . calcitRIOL  0.25 mcg Oral Daily  . docusate sodium  200 mg Oral QHS  . donepezil  10 mg Oral QHS  . folic acid  1 mg Oral q morning - 10a  . hydrALAZINE  75 mg Oral 3 times per day  . insulin aspart  0-5 Units Subcutaneous QHS  . insulin aspart  0-9 Units Subcutaneous TID WC  . insulin detemir  8 Units Subcutaneous QHS  . isosorbide mononitrate  15 mg Oral q morning - 10a  . levETIRAcetam  500 mg Oral BID  . linagliptin  5 mg Oral q morning - 10a  . metoprolol succinate  50 mg Oral q morning - 10a  . pantoprazole (PROTONIX) IV  40 mg Intravenous Q24H  . potassium chloride  10 mEq Oral Daily  . simvastatin  10 mg Oral q1800  . sodium bicarbonate  650 mg Oral TID  . sodium chloride  3 mL Intravenous Q12H  . [START ON 07/25/2014] Vitamin D (Ergocalciferol)  50,000 Units Oral Q Tue   Continuous Infusions: . sodium chloride 50 mL/hr at 07/19/14 1524  . sodium chloride 20 mL/hr at 07/19/14 2019      Time spent: 25 minutes    Louellen Molder  [facility] Pager [phone] If 7PM-7AM, please contact night-coverage at [email or web address], password [facility] 07/20/2014, 1:12 PM  LOS: 2 days

## 2014-07-20 NOTE — Progress Notes (Signed)
Patient visiting with sister at bedside.  Spoke to daughter, [name] on the phone and [name] stated that sister could sign the consent form.  [name] stated that she would also be visiting the hospital later today. Will continue to monitor patient.

## 2014-07-21 ENCOUNTER — Encounter (HOSPITAL_COMMUNITY): Payer: Self-pay | Admitting: Internal Medicine

## 2014-07-21 LAB — GLUCOSE, CAPILLARY
GLUCOSE-CAPILLARY: 119 mg/dL — AB (ref 70–99)
GLUCOSE-CAPILLARY: 160 mg/dL — AB (ref 70–99)
Glucose-Capillary: 151 mg/dL — ABNORMAL HIGH (ref 70–99)
Glucose-Capillary: 110 mg/dL — ABNORMAL HIGH (ref 70–99)

## 2014-07-21 LAB — CBC
HCT: 29.1 % — ABNORMAL LOW (ref 36.0–46.0)
Hemoglobin: 9.1 g/dL — ABNORMAL LOW (ref 12.0–15.0)
MCH: 28.4 pg (ref 26.0–34.0)
MCHC: 31.3 g/dL (ref 30.0–36.0)
MCV: 90.9 fL (ref 78.0–100.0)
Platelets: 265 10*3/uL (ref 150–400)
RBC: 3.2 MIL/uL — ABNORMAL LOW (ref 3.87–5.11)
RDW: 18.1 % — ABNORMAL HIGH (ref 11.5–15.5)
WBC: 4.7 10*3/uL (ref 4.0–10.5)

## 2014-07-21 LAB — TYPE AND SCREEN
ABO/RH(D): A POS
ANTIBODY SCREEN: NEGATIVE
UNIT DIVISION: 0
UNIT DIVISION: 0
Unit division: 0
Unit division: 0

## 2014-07-21 LAB — BASIC METABOLIC PANEL
Anion gap: 10 (ref 5–15)
BUN: 35 mg/dL — AB (ref 6–23)
CHLORIDE: 115 meq/L — AB (ref 96–112)
CO2: 20 mEq/L (ref 19–32)
CREATININE: 2.12 mg/dL — AB (ref 0.50–1.10)
Calcium: 8.1 mg/dL — ABNORMAL LOW (ref 8.4–10.5)
GFR calc Af Amer: 25 mL/min — ABNORMAL LOW (ref >90)
GFR calc non Af Amer: 22 mL/min — ABNORMAL LOW (ref >90)
GLUCOSE: 195 mg/dL — AB (ref 70–99)
Potassium: 4.6 mEq/L (ref 3.7–5.3)
Sodium: 145 mEq/L (ref 137–147)

## 2014-07-21 MED ORDER — PANTOPRAZOLE SODIUM 40 MG PO TBEC
40.0000 mg | DELAYED_RELEASE_TABLET | Freq: Every day | ORAL | Status: DC
  Administered 2014-07-21 – 2014-07-22 (×2): 40 mg via ORAL
  Filled 2014-07-21 (×2): qty 1

## 2014-07-21 NOTE — Progress Notes (Signed)
Box from capsule study was removed by [facility] to create video. Will continue to monitor.

## 2014-07-21 NOTE — Progress Notes (Signed)
  Subjective:  Without complaints. Hungry.   Objective: Vital signs in last 24 hours: Temp:  [98.7 F (37.1 C)-99.1 F (37.3 C)] 98.8 F (37.1 C) (08/28 0521) Pulse Rate:  [68-93] 80 (08/28 0521) Resp:  [14-23] 16 (08/28 0521) BP: (121-176)/(51-151) 144/58 mmHg (08/28 0521) SpO2:  [89 %-100 %] 97 % (08/28 0521) Last BM Date: 07/19/14 General:   Alert,  Well-developed, well-nourished, pleasant and cooperative in NAD Head:  Normocephalic and atraumatic. Eyes:  Sclera clear, no icterus.   Abdomen:  Soft, nontender and nondistended.  Normal bowel sounds, without guarding, and without rebound.   Extremities:  Without clubbing, deformity or edema. Neurologic:  Alert and  oriented x4;  grossly normal neurologically. Skin:  Intact without significant lesions or rashes. Psych:  Alert and cooperative. Normal mood and affect.  Intake/Output from previous day: 08/27 0701 - 08/28 0700 In: 1372 [P.O.:222; I.V.:1150] Out: 200 [Urine:200] Intake/Output this shift:    Lab Results: CBC  Recent Labs  07/19/14 0857 07/20/14 0611 07/21/14 0616  WBC 6.0 5.2 4.7  HGB 10.5* 8.9* 9.1*  HCT 31.4* 27.5* 29.1*  MCV 87.0 86.8 90.9  PLT 270 266 265   BMET  Recent Labs  07/18/14 1432 07/19/14 0857 07/20/14 0611  NA 143 144 145  K 4.0 4.0 3.9  CL 105 109 113*  CO2 22 22 21   GLUCOSE 143* 154* 114*  BUN 63* 59* 51*  CREATININE 3.08* 2.65* 2.36*  CALCIUM 8.5 8.5 8.4   LFTs  Recent Labs  07/19/14 0857  BILITOT 1.3*  ALKPHOS 75  AST 29  ALT 15  PROT 8.8*  ALBUMIN 3.1*   No results found for this basename: LIPASE,  in the last 72 hours PT/INR No results found for this basename: LABPROT, INR,  in the last 72 hours    Imaging Studies: No results found.[2 weeks]   Assessment: 73 y/o AA female on low-dose ASA and Aggrenox who presented with recurrent profound anemia and H/O melena. EGD 12/2013 by Dr. Gala Romney showed abnormal gastric and duodenal bulbar mucosa (H.pylori) now s/p tx,  colonoscopy 06/25/2014 by Dr. Laural Golden 84mm polyp (tubular adenoma), pandiverticulosis. Underwent EGD with capsule placement yesterday. Tiny gastric polyps not manipulated, minimal pigmentation of duodenal bulbar likely insignificant. Capsule was placed. Images to be read and results available later today.   H/H stable s/p 3 units of prbcs.   Plan: 1. Dr. Oneida Alar to provide [facility] results later today. 2. Continue clear liquid diet until then.   LOS: 3 days   Neil Crouch  07/21/2014, 9:40 AM

## 2014-07-21 NOTE — Progress Notes (Signed)
[facility] PROGRESS NOTE  [name] BZJ:[id number] DOB: [birth year] DOA: 07/18/2014 PCP: [name], MD   Brief narrative 73 year old female with dementia, type 2 diabetes mellitus, history of stroke, NSTEMI, seizure disorders, CAD S/p CABG, ischemic cardiomyopathy, iron deficiency anemia with colonoscopy done one month back and plan for outpatient capsule study (as she could not tolerate it as inpatient) was brought into the ED as she was progressively weaker and blood work done by home health for her to be severely anemic. Houghtaling in the ED was 5.1. Patient admitted to telemetry and transfused with 3 units PRBC.  Assessment/Plan: Severe iron deficiency anemia Possible GI bleed with no clear source identified so far. EGD done in February this year showed Erosive gastritis. A colonoscopy done 3 weeks back during hospitalization for severe anemia showed 2 small polyps. She could not tolerate capsule study as inpatient and was scheduled as outpatient for next week. -3 units PRBC given. Hemoglobin now 9 Capsule study performed last evening will be resulted this afternoon  -Hold Aggrenox. If capsule study negative ,  would need to be discontinued permanently. -Monitor serial H&H.  -Continue PPI  Acute on chronic kidney disease Possibly prerenal in the involved dehydration. Slowly improving with IV fluids. holding  torsemide. Monitor renal function daily.   Diabetes mellitus type 2 Continue home dose of Levemir insulin and sliding scale insulin.  Coronary artery disease Continue iimdur, Toprol, and statin  Ischemic cardiomyopathy with diastolic dysfunction Appears euvolemic currently. Holding torsemide. Monitor I/O  History of CVA Holding Aggrenox. Continue statin  Dementia Continue Aricept  History of seizures Continue Keppra  DVT prophylaxis: SCDs  Diet: clears    Code Status: Full code Family Communication: None at bedside. Called daughter but unable to  reach Disposition Plan: home with [facility] Once improved and workup completed   Consultants:  GI  Procedures:  None  Antibiotics:  None  HPI/Subjective: Pt seen and examined. No overnight issues   Objective: Filed Vitals:   07/21/14 0521  BP: 144/58  Pulse: 80  Temp: 98.8 F (37.1 C)  Resp: 16    Intake/Output Summary (Last 24 hours) at 07/21/14 1437 Last data filed at 07/21/14 1300  Gross per 24 hour  Intake 1593.66 ml  Output    200 ml  Net 1393.66 ml   Filed Weights   07/18/14 1348 07/20/14 0527  Weight: 62.596 kg (138 lb) 63.594 kg (140 lb 3.2 oz)    Exam:   General:  no acute distress  HEENT: Pallor present, moist mucosa  Cardiovascular: Normal S1 and S2, no murmurs  Respiratory: Clear bilaterally, no added sounds  Abdomen: Soft, nontender, nondistended, bowel sounds present  Musculoskeletal: Warm, no edema  CNS: AAO x 1-2   Data Reviewed: Basic Metabolic Panel:  Recent Labs Lab 07/18/14 1432 07/19/14 0857 07/20/14 0611  NA 143 144 145  K 4.0 4.0 3.9  CL 105 109 113*  CO2 22 22 21   GLUCOSE 143* 154* 114*  BUN 63* 59* 51*  CREATININE 3.08* 2.65* 2.36*  CALCIUM 8.5 8.5 8.4   Liver Function Tests:  Recent Labs Lab 07/19/14 0857  AST 29  ALT 15  ALKPHOS 75  BILITOT 1.3*  PROT 8.8*  ALBUMIN 3.1*   No results found for this basename: LIPASE, AMYLASE,  in the last 168 hours No results found for this basename: AMMONIA,  in the last 168 hours CBC:  Recent Labs Lab 07/18/14 1432 07/19/14 0857 07/20/14 0611 07/21/14 0616  WBC 5.9 6.0 5.2  4.7  NEUTROABS 4.2  --   --   --   HGB 5.1* 10.5* 8.9* 9.1*  HCT 16.7* 31.4* 27.5* 29.1*  MCV 89.3 87.0 86.8 90.9  PLT 318 270 266 265   Cardiac Enzymes:  Recent Labs Lab 07/18/14 1432 07/18/14 1654 07/18/14 2239 07/19/14 0857  TROPONINI <0.30 <0.30 <0.30 <0.30   BNP (last 3 results)  Recent Labs  03/15/14 1645 04/27/14 1536 06/09/14 1021  PROBNP 37134.0* 51631.0* 30358.0*    CBG:  Recent Labs Lab 07/20/14 1448 07/20/14 1623 07/20/14 2113 07/21/14 0719 07/21/14 1143  GLUCAP 89 89 89 119* 151*    No results found for this or any previous visit (from the past 240 hour(s)).   Studies: No results found.  Scheduled Meds: . calcitRIOL  0.25 mcg Oral Daily  . docusate sodium  200 mg Oral QHS  . donepezil  10 mg Oral QHS  . folic acid  1 mg Oral q morning - 10a  . hydrALAZINE  75 mg Oral 3 times per day  . insulin aspart  0-5 Units Subcutaneous QHS  . insulin aspart  0-9 Units Subcutaneous TID WC  . insulin detemir  8 Units Subcutaneous QHS  . isosorbide mononitrate  15 mg Oral q morning - 10a  . levETIRAcetam  500 mg Oral BID  . linagliptin  5 mg Oral q morning - 10a  . metoprolol succinate  50 mg Oral q morning - 10a  . pantoprazole (PROTONIX) IV  40 mg Intravenous Q24H  . potassium chloride  10 mEq Oral Daily  . simvastatin  10 mg Oral q1800  . sodium bicarbonate  650 mg Oral TID  . sodium chloride  3 mL Intravenous Q12H  . [START ON 07/25/2014] Vitamin D (Ergocalciferol)  50,000 Units Oral Q Tue   Continuous Infusions: . sodium chloride 50 mL/hr at 07/20/14 2253      Time spent: 25 minutes    Jourden Delmont  [facility] Pager [phone] If 7PM-7AM, please contact night-coverage at [email or web address], password [facility] 07/21/2014, 2:37 PM  LOS: 3 days

## 2014-07-21 NOTE — Progress Notes (Signed)
REVIEWED-NO BRBPR OR MELENA. Hb stable. CAPSULE STUDY INCOMPLETE. PT WILL NEED OP TO [facility] FOR DBE. WILL DISCUSS WITH DAUGHTER.

## 2014-07-22 DIAGNOSIS — Z7982 Long term (current) use of aspirin: Secondary | ICD-10-CM

## 2014-07-22 DIAGNOSIS — D649 Anemia, unspecified: Secondary | ICD-10-CM

## 2014-07-22 DIAGNOSIS — K922 Gastrointestinal hemorrhage, unspecified: Secondary | ICD-10-CM | POA: Diagnosis present

## 2014-07-22 DIAGNOSIS — N179 Acute kidney failure, unspecified: Secondary | ICD-10-CM | POA: Diagnosis present

## 2014-07-22 DIAGNOSIS — R5381 Other malaise: Secondary | ICD-10-CM

## 2014-07-22 DIAGNOSIS — R5383 Other fatigue: Secondary | ICD-10-CM

## 2014-07-22 DIAGNOSIS — N184 Chronic kidney disease, stage 4 (severe): Secondary | ICD-10-CM

## 2014-07-22 LAB — BASIC METABOLIC PANEL
Anion gap: 10 (ref 5–15)
BUN: 31 mg/dL — ABNORMAL HIGH (ref 6–23)
CHLORIDE: 111 meq/L (ref 96–112)
CO2: 21 mEq/L (ref 19–32)
CREATININE: 2.02 mg/dL — AB (ref 0.50–1.10)
Calcium: 8.2 mg/dL — ABNORMAL LOW (ref 8.4–10.5)
GFR calc non Af Amer: 23 mL/min — ABNORMAL LOW (ref >90)
GFR, EST AFRICAN AMERICAN: 27 mL/min — AB (ref >90)
Glucose, Bld: 77 mg/dL (ref 70–99)
Potassium: 4.4 mEq/L (ref 3.7–5.3)
Sodium: 142 mEq/L (ref 137–147)

## 2014-07-22 LAB — CBC
HCT: 29.1 % — ABNORMAL LOW (ref 36.0–46.0)
Hemoglobin: 9 g/dL — ABNORMAL LOW (ref 12.0–15.0)
MCH: 28.5 pg (ref 26.0–34.0)
MCHC: 30.9 g/dL (ref 30.0–36.0)
MCV: 92.1 fL (ref 78.0–100.0)
Platelets: 230 10*3/uL (ref 150–400)
RBC: 3.16 MIL/uL — ABNORMAL LOW (ref 3.87–5.11)
RDW: 18.4 % — ABNORMAL HIGH (ref 11.5–15.5)
WBC: 4 10*3/uL (ref 4.0–10.5)

## 2014-07-22 LAB — GLUCOSE, CAPILLARY
Glucose-Capillary: 80 mg/dL (ref 70–99)
Glucose-Capillary: 114 mg/dL — ABNORMAL HIGH (ref 70–99)

## 2014-07-22 NOTE — Progress Notes (Signed)
  Subjective:  Patient has no complaints. She states she offer breakfast. She denies nausea vomiting heartburn abdominal pain melena or rectal bleeding. She states she did have a bowel movement this morning and was small.   Objective: Blood pressure 143/73, pulse 74, temperature 98.2 F (36.8 C), temperature source Oral, resp. rate 18, height 5\' 5"  (1.651 m), weight 140 lb 3.2 oz (63.594 kg), SpO2 99.00%. Patient is alert and in no acute distress. Abdomen is soft and nontender without organomegaly or masses.  Labs/studies Results:   Recent Labs  07/20/14 0611 07/21/14 0616 07/22/14 0829  WBC 5.2 4.7 4.0  HGB 8.9* 9.1* 9.0*  HCT 27.5* 29.1* 29.1*  PLT 266 265 230    BMET   Recent Labs  07/20/14 0611 07/21/14 1505 07/22/14 0829  NA 145 145 142  K 3.9 4.6 4.4  CL 113* 115* 111  CO2 21 20 21   GLUCOSE 114* 195* 77  BUN 51* 35* 31*  CREATININE 2.36* 2.12* 2.02*  CALCIUM 8.4 8.1* 8.2*      Assessment:  #1. Anemia secondary to recurrent GI bleed; source not identified. She has received 3 units of PRBCs during this admission. She had EGD back in February this year and colonoscopy 4 weeks ago. She also underwent a given capsule study which is incomplete as capsule did not make it to the colon. Dr. Oneida Alar planning to send patient to [facility] for double balloon enteroscopy. Patient is at risk for recurrent bleed since she is on aspirin and Aggrenox. #2. Multiple other problems stable.    Recommendations;   Agree with DC planning. Referral to [facility] for small bowel endoscopy to be arranged by Dr. Oneida Alar.

## 2014-07-22 NOTE — Discharge Summary (Signed)
Physician Discharge Summary  [name] SNK:[id number] DOB: 01/06/1941 DOA: 07/18/2014  PCP: [name], MD  Admit date: 07/18/2014 Discharge date: 07/22/2014  Time spent: 35 minutes  Recommendations for Outpatient Follow-up:  1. Discharged home with home health PT 2. Followup with PCP within one week to have repeat H&H checked. 3. Discontinued baby aspirin and Aggrenox in the setting of recurrent GI bleed without clear source. 4. Dr. Oneida Alar will arrange outpatient referral appointment at [facility] for outpatient double balloon enteroscopy.   Discharge Diagnoses:  Principal Problem:   GI bleed   Active Problems:   Severe anemia   Hypertension   Diabetes mellitus   Seizure disorder   Dementia   Cardiomyopathy, ischemic   Acute renal failure superimposed on stage 4 chronic kidney disease   Discharge Condition: Fair  Diet recommendation: Cardiac/diabetic  CODE STATUS: Full code  Filed Weights   07/18/14 1348 07/20/14 0527  Weight: 62.596 kg (138 lb) 63.594 kg (140 lb 3.2 oz)    History of present illness:  Please refer to admission H&P for details, but in brief,73 year old female with dementia, type 2 diabetes mellitus, history of stroke, NSTEMI, seizure disorders, CAD S/p CABG, ischemic cardiomyopathy, iron deficiency anemia with colonoscopy done one month back and plan for outpatient capsule study (as she could not tolerate it as inpatient) was brought into the ED as she was progressively weaker and blood work done by home health for her to be severely anemic.  Hemoglobin in the ED was 5.1. Patient admitted to telemetry and transfused with 3 units PRBC.   Hospital Course:  Severe iron deficiency anemia with GI bleed No clear source of GI be identified.  EGD done in February this year showed Erosive gastritis. A colonoscopy done 3 weeks back during previous hospitalization for severe anemia showed 2 small polyps which were to rule out adenoma on  biopsy. She could not tolerate capsule study as inpatient at that time and was scheduled as outpatient and discharged. -Patient was transfused with 3 units PRBC given. Hemoglobin stable at 9 post transfusion. -Capsule study performed and reported to be incomplete as per GI. Recommends need for outpatient double balloon enteroscopy to be done at [facility] and GI will arrange for an appointment. -Given unknown source of GI bleed, I will discontinue her Aggrenox and baby aspirin until outp workup is completed. If no source of GI bleeding is found even on double balloon enteroscopy she should be taken off Aggrenox and aspirin given  severe symptomatic anemia. -Continue PPI.  Acute on chronic kidney disease  Possibly prerenal in the involved dehydration. Torsemide was held and given gentle hydration week improvement in her renal function to baseline. We'll resume torsemide upon discharge  Diabetes mellitus type 2  Continue home dose of Levemir insulin and tradjenta.  Coronary artery disease  Continue imdur, Toprol, and statin   Ischemic cardiomyopathy with diastolic dysfunction  Appears euvolemic. We'll resume torsemide. Continue metoprolol, and/or and hydralazine. Not on ACE inhibitor due to  impaired renal function . Followup as outpatient  History of CVA  disontinue Aggrenox. Continue statin   Dementia  Continue Aricept   History of seizures  Continue Keppra     Code Status: Full code  Family Communication: Spoke with the daughter [name] and explained the plan in detail  Disposition Plan: home with [facility] with outpatient followup  Consultants:  GI   Procedures:  Capsule Endoscopy  Antibiotics:  None    Discharge Exam: PFXTK  Vitals:   07/22/14 0714  BP: 143/73  Pulse: 74  Temp: 98.2 F (36.8 C)  Resp: 18    General: Elderly female in no acute distress  HEENT: Pallor present, moist mucosa  Cardiovascular: Normal S1 and S2, no murmurs  Respiratory:  Clear bilaterally, no added sounds  Abdomen: Soft, nontender, nondistended, bowel sounds present  Musculoskeletal: Warm, no edema  CNS: AAO x 1-2    Discharge Instructions You were cared for by a hospitalist during your hospital stay. If you have any questions about your discharge medications or the care you received while you were in the hospital after you are discharged, you can call the unit and asked to speak with the hospitalist on call if the hospitalist that took care of you is not available. Once you are discharged, your primary care physician will handle any further medical issues. Please note that NO REFILLS for any discharge medications will be authorized once you are discharged, as it is imperative that you return to your primary care physician (or establish a relationship with a primary care physician if you do not have one) for your aftercare needs so that they can reassess your need for medications and monitor your lab values.     Medication List    STOP taking these medications       aspirin EC 81 MG tablet     dipyridamole-aspirin 200-25 MG per 12 hr capsule  Commonly known as:  AGGRENOX      TAKE these medications       calcitRIOL 0.25 MCG capsule  Commonly known as:  ROCALTROL  Take 1 capsule (0.25 mcg total) by mouth daily.     donepezil 10 MG tablet  Commonly known as:  ARICEPT  Take 10 mg by mouth at bedtime.     folic acid 1 MG tablet  Commonly known as:  FOLVITE  Take 1 mg by mouth every morning.     hydrALAZINE 25 MG tablet  Commonly known as:  APRESOLINE  Take 75 mg by mouth every 8 (eight) hours.     insulin detemir 100 UNIT/ML injection  Commonly known as:  LEVEMIR  Inject 0.08 mLs (8 Units total) into the skin at bedtime.     isosorbide mononitrate 30 MG 24 hr tablet  Commonly known as:  IMDUR  Take 0.5 mg by mouth every morning.     levETIRAcetam 100 MG/ML solution  Commonly known as:  KEPPRA  Take 5 mLs by mouth 2 (two) times daily.      metoprolol succinate 100 MG 24 hr tablet  Commonly known as:  TOPROL-XL  Take 50 mg by mouth every morning. Take with or immediately following a meal.     pantoprazole 40 MG tablet  Commonly known as:  PROTONIX  Take 40 mg by mouth every morning.     potassium chloride 10 MEQ CR capsule  Commonly known as:  MICRO-K  Take 10 mEq by mouth See admin instructions. Alternate taking one Potassium daily with taking one Potassium twice daily with Torsemide as directed     pravastatin 20 MG tablet  Commonly known as:  PRAVACHOL  Take 20 mg by mouth at bedtime.     sodium bicarbonate 650 MG tablet  Take 650 mg by mouth 3 (three) times daily.     torsemide 100 MG tablet  Commonly known as:  DEMADEX  Take 0.5 tablets (50 mg total) by mouth daily.     TRADJENTA 5 MG Tabs tablet  Generic drug:  linagliptin  Take 5 mg by mouth every morning.     Vitamin D (Ergocalciferol) 50000 UNITS Caps capsule  Commonly known as:  DRISDOL  Take 50,000 Units by mouth every 7 (seven) days. Takes on Tuesday       No Known Allergies     Follow-up Information   Follow up with [place].   Contact information:   [address] [place] [address] [phone]       Follow up with [facility], MD In 1 week.   Specialty:  Family Medicine   Contact information:   [address] [place] [address] [phone]       Follow up with Barney Drain, MD In 1 week. (Office will call for referral appt to [facility])    Specialty:  Gastroenterology   Contact information:   [place] [address] [place] [address] [phone]        The results of significant diagnostics from this hospitalization (including imaging, microbiology, ancillary and laboratory) are listed below for reference.    Significant Diagnostic Studies: No results found.  Microbiology: No results found for this or any previous visit (from the  past 240 hour(s)).   Labs: Basic Metabolic Panel:  Recent Labs Lab 07/18/14 1432 07/19/14 0857 07/20/14 0611 07/21/14 1505 07/22/14 0829  NA 143 144 145 145 142  K 4.0 4.0 3.9 4.6 4.4  CL 105 109 113* 115* 111  CO2 22 22 21 20 21   GLUCOSE 143* 154* 114* 195* 77  BUN 63* 59* 51* 35* 31*  CREATININE 3.08* 2.65* 2.36* 2.12* 2.02*  CALCIUM 8.5 8.5 8.4 8.1* 8.2*   Liver Function Tests:  Recent Labs Lab 07/19/14 0857  AST 29  ALT 15  ALKPHOS 75  BILITOT 1.3*  PROT 8.8*  ALBUMIN 3.1*   No results found for this basename: LIPASE, AMYLASE,  in the last 168 hours No results found for this basename: AMMONIA,  in the last 168 hours CBC:  Recent Labs Lab 07/18/14 1432 07/19/14 0857 07/20/14 0611 07/21/14 0616 07/22/14 0829  WBC 5.9 6.0 5.2 4.7 4.0  NEUTROABS 4.2  --   --   --   --   HGB 5.1* 10.5* 8.9* 9.1* 9.0*  HCT 16.7* 31.4* 27.5* 29.1* 29.1*  MCV 89.3 87.0 86.8 90.9 92.1  PLT 318 270 266 265 230   Cardiac Enzymes:  Recent Labs Lab 07/18/14 1432 07/18/14 1654 07/18/14 2239 07/19/14 0857  TROPONINI <0.30 <0.30 <0.30 <0.30   BNP: BNP (last 3 results)  Recent Labs  03/15/14 1645 04/27/14 1536 06/09/14 1021  PROBNP 37134.0* 51631.0* 30358.0*   CBG:  Recent Labs Lab 07/21/14 0719 07/21/14 1143 07/21/14 1634 07/21/14 2132 07/22/14 0725  GLUCAP 119* 151* 110* 160* 80       Signed:  Kennidi Yoshida  [facility] 07/22/2014, 10:38 AM

## 2014-07-22 NOTE — Discharge Instructions (Signed)
Gastrointestinal Bleeding °Gastrointestinal bleeding is bleeding somewhere along the path that food travels through the body (digestive tract). This path is anywhere between the mouth and the opening of the butt (anus). You may have blood in your throw up (vomit) or in your poop (stools). If there is a lot of bleeding, you may need to stay in the hospital. °HOME CARE °· Only take medicine as told by your doctor. °· Eat foods with fiber such as whole grains, fruits, and vegetables. You can also try eating 1 to 3 prunes a day. °· Drink enough fluids to keep your pee (urine) clear or pale yellow. °GET HELP RIGHT AWAY IF:  °· Your bleeding gets worse. °· You feel dizzy, weak, or you pass out (faint). °· You have bad cramps in your back or belly (abdomen). °· You have large blood clumps (clots) in your poop. °· Your problems are getting worse. °MAKE SURE YOU:  °· Understand these instructions. °· Will watch your condition. °· Will get help right away if you are not doing well or get worse. °Document Released: 08/19/2008 Document Revised: 10/27/2012 Document Reviewed: 10/20/2011 °Exit[employer]® Patient Information ©2015 [employer]. This information is not intended to replace advice given to you by your health care provider. Make sure you discuss any questions you have with your health care provider. ° ° °

## 2014-07-22 NOTE — Progress Notes (Signed)
Patient discharged home with family.  HH orders faxed to [facility] and called and notified of patients return to home.  IV removed - WNL.  Reviewed DC instructions with patients daughter as patient has dementia and confusion.  No changes to meds made, reviewed times when meds are due again for the rest of the day.  Instructed to make follow up with PCP in 1 week as well as with GI - who should be calling to notify time of appt at [facility].  Educated and instructed family to monitor patients stool for blood and to encourage PO fluid in take.  Family verbalizes understanding.  No questions at this time.  Pt stable to DC home, left floor via WC with NT assist.

## 2014-07-24 ENCOUNTER — Telehealth: Payer: Self-pay | Admitting: Gastroenterology

## 2014-07-24 ENCOUNTER — Other Ambulatory Visit: Payer: Self-pay | Admitting: Gastroenterology

## 2014-07-24 DIAGNOSIS — D649 Anemia, unspecified: Secondary | ICD-10-CM

## 2014-07-24 NOTE — Telephone Encounter (Signed)
Patient needs a KUB tomorrow to make sure givens capsule passed. It did not make it to cecum during the study.

## 2014-07-24 NOTE — Progress Notes (Signed)
UR chart review completed.  

## 2014-07-24 NOTE — Telephone Encounter (Signed)
Order for KUB is placed I spoke to [name] and she is aware that she needs to go tomorrow to Radiology to have the x-ray

## 2014-07-25 ENCOUNTER — Telehealth: Payer: Self-pay | Admitting: Gastroenterology

## 2014-07-25 ENCOUNTER — Ambulatory Visit (HOSPITAL_COMMUNITY)
Admission: RE | Admit: 2014-07-25 | Discharge: 2014-07-25 | Disposition: A | Discharge status: Home / Self Care | Payer: Medicare Other | Source: Ambulatory Visit | Attending: Gastroenterology | Admitting: Gastroenterology

## 2014-07-25 DIAGNOSIS — D649 Anemia, unspecified: Secondary | ICD-10-CM | POA: Diagnosis not present

## 2014-07-25 NOTE — Telephone Encounter (Signed)
Received call from Dr. Nevada Crane with radiology. No evidence of capsule in abdomen or pelvis. From review of notes, appears capsule did not make it to the cecum. This imaging alleviates concern for retained capsule.

## 2014-07-26 NOTE — Telephone Encounter (Signed)
Noted  

## 2014-07-27 ENCOUNTER — Telehealth: Payer: Self-pay

## 2014-07-27 ENCOUNTER — Encounter: Payer: Self-pay | Admitting: Gastroenterology

## 2014-07-27 ENCOUNTER — Ambulatory Visit (INDEPENDENT_AMBULATORY_CARE_PROVIDER_SITE_OTHER): Payer: Medicare Other | Admitting: Gastroenterology

## 2014-07-27 ENCOUNTER — Other Ambulatory Visit: Payer: Self-pay

## 2014-07-27 VITALS — BP 139/71 | HR 75 | Temp 97.2°F | Ht 65.0 in | Wt 131.4 lb

## 2014-07-27 DIAGNOSIS — D649 Anemia, unspecified: Secondary | ICD-10-CM

## 2014-07-27 DIAGNOSIS — I2510 Atherosclerotic heart disease of native coronary artery without angina pectoris: Secondary | ICD-10-CM

## 2014-07-27 NOTE — Progress Notes (Signed)
Cc to pcp °

## 2014-07-27 NOTE — Telephone Encounter (Signed)
Pt's daughter, Jyl Chico, called to check on her mom's appts where she has been referred. I told her about the one with the cancer center on 08/14/2014. I told her that she was being referred to [facility] and they will notify her of that appt. Reminded them to pick up reports at the hospital and take to that appt.  Vicky's daughter, pt's granddaughter, Deborra Medina, was with pt at the office today and she thought pt had an appt for tomorrow. I told her that I did not see anything about that, but I will send to Darius Bump who schedules and find out for her.

## 2014-07-27 NOTE — Telephone Encounter (Signed)
LMOM to call back

## 2014-07-27 NOTE — Progress Notes (Signed)
Subjective:    Patient ID: [name], female    DOB: [birth year], 73 y.o.   MRN: [id number] [name], MD  HPI Pt had Ferritin 21 Jan 2014. June 2015 began to require pRBCs TO KEEP Hb ABOVE 8-9. PRESENTED WITH LOW Hb 4 TIMES SINCE June 4(JUN 4 6.8, JUL 17 5.5, Cr 2.96; JUL 30 Hb 6.4, Cr 2.52, AUG 25 Hb 5.1, Cr 3.08). PT HAS LAD 2 EGDs IN 2015(FEB/AUG), ONE TCS(AUG 2015), AND AN INCOMPLETE GIVENS STUDY(AUG 2015). NO SOURCE OF BLEEDING HAS BEEN IDENTIFIED. PT HAS DEMENTIA. FAMILY AND PT DENY MELENA/HEMATEMESIS/BRBPR.  PT DENIES FEVER, CHILLS, nausea, vomiting, melena, diarrhea, CHEST PAIN, SHORTNESS OF BREATH,   abdominal pain, problems swallowing, OR heartburn or indigestion.   Past Medical History  Diagnosis Date  . Type 2 diabetes mellitus   . Essential hypertension, benign   . History of stroke     Previously on Coumadin  . MI, old     Reported 52  . Seizure disorder   . Coronary atherosclerosis of native coronary artery     Multivessel status post CABG 2002  . History of GI bleed     Erosive gastritis and duodenitis by EGD 2002  . Mixed hyperlipidemia   . Ischemic cardiomyopathy     LVEF 40-45% March 2013  . Dementia   . Stroke   . Seizures   . CKD (chronic kidney disease) stage 4, GFR 15-29 ml/min   . CHF (congestive heart failure)    Past Surgical History  Procedure Laterality Date  . Coronary artery bypass graft  July 2002    LIMA to LAD, SVG to OM1, SVG to OM 2, SVG to RCA  . Tee without cardioversion  01/28/2012    Procedure: TRANSESOPHAGEAL ECHOCARDIOGRAM (TEE);  Surgeon: Lelon Perla, MD;  Location: [facility] ENDOSCOPY;  Service: Cardiovascular;  Laterality: [name];  . Colonoscopy  2002  . Esophagogastroduodenoscopy  2002  . Esophagogastroduodenoscopy [name] 01/18/2014    MOQ:HUTML hiatal hernia. Abnormal gastric and duodenal bulbar mucosa of uncertain significance - status post gastric bx (chronic gastritis/H.pylori +  . Colonoscopy [name] 06/25/2014    Procedure:  COLONOSCOPY;  Surgeon: Rogene Houston, MD;  Location: [facility] ENDO SUITE;  Service: Endoscopy;  Laterality: [name];  . Agile capsule [name] 07/06/2014    Procedure: AGILE CAPSULE;  Surgeon: Danie Binder, MD;  Location: [facility] ENDO SUITE;  Service: Endoscopy;  Laterality: [name];  730  . Esophagogastroduodenoscopy [name] 07/20/2014    Procedure: ESOPHAGOGASTRODUODENOSCOPY (EGD);  Surgeon: Daneil Dolin, MD;  Location: [facility] ENDO SUITE;  Service: Endoscopy;  Laterality: [name];  . Givens capsule study [name] 07/20/2014    Procedure: GIVENS CAPSULE STUDY;  Surgeon: Daneil Dolin, MD;  Location: [facility] ENDO SUITE;  Service: Endoscopy;  Laterality: [name];    No Known Allergies  Current Outpatient Prescriptions  Medication Sig Dispense Refill  . donepezil (ARICEPT) 10 MG tablet Take 10 mg by mouth at bedtime.       . folic acid (FOLVITE) 1 MG tablet Take 1 mg by mouth every morning.       . hydrALAZINE (APRESOLINE) 25 MG tablet Take 75 mg by mouth every 8 (eight) hours.    . insulin detemir (LEVEMIR) 100 UNIT/ML injection Inject 0.08 mLs (8 Units total) into the skin at bedtime.    . isosorbide mononitrate (IMDUR) 30 MG 24 hr tablet Take 0.5 mg by mouth every morning.    . levETIRAcetam (KEPPRA) 100 MG/ML solution Take 5 mLs by  mouth 2 (two) times daily.    Marland Kitchen linagliptin (TRADJENTA) 5 MG TABS tablet Take 5 mg by mouth every morning.     . metoprolol succinate (TOPROL-XL) 100 MG 24 hr tablet Take 50 mg by mouth every morning. Take with or immediately following a meal.    . pantoprazole (PROTONIX) 40 MG tablet Take 40 mg by mouth every morning.    . potassium chloride (MICRO-K) 10 MEQ CR capsule Take 10 mEq by mouth See admin instructions. Alternate taking one Potassium daily with taking one Potassium twice daily with Torsemide as directed    . pravastatin (PRAVACHOL) 20 MG tablet Take 20 mg by mouth at bedtime.     . sodium bicarbonate 650 MG tablet Take 650 mg by mouth 3 (three) times daily.    Marland Kitchen torsemide (DEMADEX) 100 MG tablet  Take 0.5 tablets (50 mg total) by mouth daily.    . Vitamin D, Ergocalciferol, (DRISDOL) 50000 UNITS CAPS Take 50,000 Units by mouth every 7 (seven) days. Takes on Tuesday    . calcitRIOL (ROCALTROL) 0.25 MCG capsule Take 1 capsule (0.25 mcg total) by mouth daily.       Review of Systems     Objective:   Physical Exam  Vitals reviewed. Constitutional: She is oriented to person, place, and time. She appears well-developed and well-nourished. No distress.  HENT:  Head: Normocephalic and atraumatic.  Mouth/Throat: Oropharynx is clear and moist. No oropharyngeal exudate.  Eyes: Pupils are equal, round, and reactive to light. No scleral icterus.  Neck: Normal range of motion. Neck supple.  Cardiovascular: Normal rate, regular rhythm and normal heart sounds.   Pulmonary/Chest: Effort normal and breath sounds normal. No respiratory distress.  Abdominal: Soft. Bowel sounds are normal. She exhibits no distension. There is no tenderness.  Musculoskeletal: She exhibits no edema.  WALKS ASSISTED WITH A WALKER.  Lymphadenopathy:    She has no cervical adenopathy.  Neurological: She is alert and oriented to person, place, and time.  NO  NEW FOCAL DEFICITS   Psychiatric:  FLAT AFFECT, NL MOOD           Assessment & Plan:

## 2014-07-27 NOTE — Progress Notes (Signed)
Referral made to [facility]

## 2014-07-27 NOTE — Telephone Encounter (Signed)
Per Darius Bump, that is all that we have scheduled pt for.

## 2014-07-27 NOTE — Procedures (Signed)
EGD-FEB 2015: H PYLORI GASTRITIS. AUG 2015-GASTRITIS, TCS: AUG 2015: SIMPLE ADENOMA  PATIENT DATA: WEIGHT: 140 lbs    HEIGHT: 65 in    GASTRIC PASSAGE TIME: n/a  SB PASSAGE TIME: n/a 10m  RESULTS: Capsule released into duodenum. Occasional flecks of BRB SEEN ON INITIAL IMAGES. .  STUDY INCOMPLETE AND DID NOT REACH THE CECUM. No ULCERS, masses or AVMs seen.  NO OLD OR FRESH BLOOD SEEN IN SMALL BOWEL LUMEN.    DIAGNOSIS: INCOMPLETE GIVENS STUDY. AAS SEP 1-NO CAPSULE IN COLON OR SMALL BOWEL.  Plan: 1. HEMATOLOGY REFERRAL FOR IV FE AND PROCRIT. TRANSFUSE AS NEEDED.. 2. REFER TO [facility] FOR DBE(RETROGRADE) 3. OPV IN 3 MOS

## 2014-07-27 NOTE — Patient Instructions (Signed)
I SPOKE WITH DR. Bladen. HE WILL SEE PT FOR IV IRON INFUSIONAS AND PROCRIT SHOTS TO BUILD UP HER BLOOD.  I WILL REFER HER TO [facility] FOR OBSCURE GI BLEED/TRANSFUSION DEPENDENT ANEMIA. PLEASE PICK UP PACKET WITH REPORTS AND CD TO TAKE TO VISIT.  WE WILL CLOSELY  MONITOR HER BLOOD COUNT AND GIVER HER BLOOD TO KEEP HER HEMOGLOBIN OVER 8.0   I PERSONALLY REVIEWED ABDOMEN FILMS WITH DR BOLES AND NO CAPSULE IS IN THE BOWEL OR LUNG. 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  PLEASE CALL WITH QUESTIONS OR CONCERNSBLACK TARRY STOOLS, RECTAL BLEEDING, PROBLEMS WITH YOUR APPOINTMENTS) AND HAVE DORIS PAGE ME.  FOLLOW UP IN 3 MOS.

## 2014-07-27 NOTE — Assessment & Plan Note (Addendum)
REQUIRING pRBCs Q2-3 WEEKS DUE TO Hb DROPPING TO 5-6. ENDOSCOPIC EVALUATION HAS NOT REVEALED THE SOURCE OF BLEEDING. MAY BE DUE TO SML VOLUME OCCULT GI BLOOD LOSS IN SETTING OF RENAL INSUFFICIENCY.  DISCUSSED WITH DR. [place]. WILL SEE PT FOR IVFE AND PROCRIT. REFER TO [facility] FOR OBSCURE GI BLEED/TRANSFUSION DEPENDENT ANEMIA. FAMILY WILL PICK UP GIVENS [place] AND ENDO REPORTS TO TAKE TO VISIT. CLOSE MONITORING OF CBC. TRNASFUSE PRN FOR Hb < 8.0  I PERSONALLY REVIEWED ABD FILMS WITH DR BOLES AND NO CAPSULE IN BOWEL AND POSSIBLE LEFT KIDNEY STONE. FOLLOW UP IN 3 MOS.

## 2014-07-27 NOTE — Progress Notes (Signed)
PATIENT NIC'D  °

## 2014-07-28 NOTE — Progress Notes (Signed)
Quick Note:  No need for CXR. Capsule clearly was in the small bowel during study. Given no capsule seen on KUB, capsule has passed.  Films reviewed by Dr. Oneida Alar. See OV note documentation. ______

## 2014-08-01 ENCOUNTER — Telehealth: Payer: Self-pay

## 2014-08-01 NOTE — Telephone Encounter (Signed)
Sarah from [facility] called and said that she is seeing this pt every week. She said the pt's daughter said that she thought Dr. Oneida Alar wanted to order some labs on the pt soon. [name] said she will be back to see the pt this week on Thurs if Dr. Oneida Alar would like for her to draw some labs, she will be glad to. Please advise!

## 2014-08-02 ENCOUNTER — Other Ambulatory Visit: Payer: Self-pay

## 2014-08-02 DIAGNOSIS — D509 Iron deficiency anemia, unspecified: Secondary | ICD-10-CM

## 2014-08-02 NOTE — Telephone Encounter (Signed)
Per Dr. Oneida Alar, pt needs CBC.

## 2014-08-02 NOTE — Telephone Encounter (Signed)
Called and informed [name] and she said she could just take a verbal for the CBC.

## 2014-08-02 NOTE — Telephone Encounter (Signed)
Pt needs a CBC.

## 2014-08-09 NOTE — Progress Notes (Unsigned)
Patient ID: [name], female   DOB: 26-May-1941, 73 y.o.   MRN: [id number] [name] has an appointment at [facility] on October 18, 2014 @ 10:00 am. Patients daughter is aware of appointment and time.

## 2014-08-14 ENCOUNTER — Encounter (HOSPITAL_COMMUNITY): Payer: Medicare Other | Attending: Hematology and Oncology

## 2014-08-14 ENCOUNTER — Encounter (HOSPITAL_COMMUNITY): Payer: Self-pay

## 2014-08-14 VITALS — BP 142/92 | HR 74 | Temp 97.5°F | Resp 16 | Ht 64.0 in | Wt 126.3 lb

## 2014-08-14 DIAGNOSIS — N184 Chronic kidney disease, stage 4 (severe): Secondary | ICD-10-CM | POA: Insufficient documentation

## 2014-08-14 DIAGNOSIS — I2510 Atherosclerotic heart disease of native coronary artery without angina pectoris: Secondary | ICD-10-CM | POA: Insufficient documentation

## 2014-08-14 DIAGNOSIS — Z79899 Other long term (current) drug therapy: Secondary | ICD-10-CM | POA: Insufficient documentation

## 2014-08-14 DIAGNOSIS — I129 Hypertensive chronic kidney disease with stage 1 through stage 4 chronic kidney disease, or unspecified chronic kidney disease: Secondary | ICD-10-CM | POA: Diagnosis not present

## 2014-08-14 DIAGNOSIS — E7800 Pure hypercholesterolemia, unspecified: Secondary | ICD-10-CM | POA: Diagnosis not present

## 2014-08-14 DIAGNOSIS — E119 Type 2 diabetes mellitus without complications: Secondary | ICD-10-CM | POA: Insufficient documentation

## 2014-08-14 DIAGNOSIS — Z87891 Personal history of nicotine dependence: Secondary | ICD-10-CM | POA: Insufficient documentation

## 2014-08-14 DIAGNOSIS — D649 Anemia, unspecified: Secondary | ICD-10-CM | POA: Diagnosis not present

## 2014-08-14 DIAGNOSIS — R569 Unspecified convulsions: Secondary | ICD-10-CM | POA: Diagnosis not present

## 2014-08-14 DIAGNOSIS — I252 Old myocardial infarction: Secondary | ICD-10-CM | POA: Diagnosis not present

## 2014-08-14 DIAGNOSIS — Z87898 Personal history of other specified conditions: Secondary | ICD-10-CM | POA: Diagnosis not present

## 2014-08-14 DIAGNOSIS — N189 Chronic kidney disease, unspecified: Secondary | ICD-10-CM

## 2014-08-14 DIAGNOSIS — Z8673 Personal history of transient ischemic attack (TIA), and cerebral infarction without residual deficits: Secondary | ICD-10-CM | POA: Insufficient documentation

## 2014-08-14 DIAGNOSIS — I509 Heart failure, unspecified: Secondary | ICD-10-CM | POA: Insufficient documentation

## 2014-08-14 DIAGNOSIS — Z794 Long term (current) use of insulin: Secondary | ICD-10-CM | POA: Insufficient documentation

## 2014-08-14 DIAGNOSIS — Z951 Presence of aortocoronary bypass graft: Secondary | ICD-10-CM | POA: Diagnosis not present

## 2014-08-14 DIAGNOSIS — F0390 Unspecified dementia without behavioral disturbance: Secondary | ICD-10-CM | POA: Insufficient documentation

## 2014-08-14 DIAGNOSIS — N039 Chronic nephritic syndrome with unspecified morphologic changes: Secondary | ICD-10-CM

## 2014-08-14 DIAGNOSIS — D631 Anemia in chronic kidney disease: Secondary | ICD-10-CM

## 2014-08-14 LAB — CBC WITH DIFFERENTIAL/PLATELET
Basophils Absolute: 0 10*3/uL (ref 0.0–0.1)
Basophils Relative: 0 % (ref 0–1)
EOS PCT: 1 % (ref 0–5)
Eosinophils Absolute: 0 10*3/uL (ref 0.0–0.7)
HEMATOCRIT: 27.8 % — AB (ref 36.0–46.0)
Hemoglobin: 8.7 g/dL — ABNORMAL LOW (ref 12.0–15.0)
LYMPHS ABS: 1.2 10*3/uL (ref 0.7–4.0)
LYMPHS PCT: 17 % (ref 12–46)
MCH: 27.1 pg (ref 26.0–34.0)
MCHC: 31.3 g/dL (ref 30.0–36.0)
MCV: 86.6 fL (ref 78.0–100.0)
MONO ABS: 0.7 10*3/uL (ref 0.1–1.0)
MONOS PCT: 9 % (ref 3–12)
Neutro Abs: 5 10*3/uL (ref 1.7–7.7)
Neutrophils Relative %: 73 % (ref 43–77)
Platelets: 488 10*3/uL — ABNORMAL HIGH (ref 150–400)
RBC: 3.21 MIL/uL — AB (ref 3.87–5.11)
RDW: 19 % — ABNORMAL HIGH (ref 11.5–15.5)
WBC: 6.9 10*3/uL (ref 4.0–10.5)

## 2014-08-14 LAB — COMPREHENSIVE METABOLIC PANEL
ALT: 10 U/L (ref 0–35)
AST: 24 U/L (ref 0–37)
Albumin: 2.8 g/dL — ABNORMAL LOW (ref 3.5–5.2)
Alkaline Phosphatase: 91 U/L (ref 39–117)
Anion gap: 12 (ref 5–15)
BUN: 55 mg/dL — ABNORMAL HIGH (ref 6–23)
CO2: 29 meq/L (ref 19–32)
CREATININE: 2.5 mg/dL — AB (ref 0.50–1.10)
Calcium: 9.5 mg/dL (ref 8.4–10.5)
Chloride: 105 mEq/L (ref 96–112)
GFR, EST AFRICAN AMERICAN: 21 mL/min — AB (ref >90)
GFR, EST NON AFRICAN AMERICAN: 18 mL/min — AB (ref >90)
GLUCOSE: 119 mg/dL — AB (ref 70–99)
Potassium: 3.8 mEq/L (ref 3.7–5.3)
Sodium: 146 mEq/L (ref 137–147)
Total Bilirubin: 0.2 mg/dL — ABNORMAL LOW (ref 0.3–1.2)
Total Protein: 10.3 g/dL — ABNORMAL HIGH (ref 6.0–8.3)

## 2014-08-14 LAB — IRON AND TIBC
Iron: 27 ug/dL — ABNORMAL LOW (ref 42–135)
Saturation Ratios: 11 % — ABNORMAL LOW (ref 20–55)
TIBC: 252 ug/dL (ref 250–470)
UIBC: 225 ug/dL (ref 125–400)

## 2014-08-14 LAB — LACTATE DEHYDROGENASE: LDH: 236 U/L (ref 94–250)

## 2014-08-14 NOTE — Progress Notes (Signed)
Newberry A. Barnet Glasgow, M.D.  NEW PATIENT EVALUATION   Name: [name] Date: 08/14/2014 MRN: [id number] DOB: 1941/05/31  PCP: [name], MD   REFERRING PHYSICIAN: Iona Beard, MD  REASON FOR REFERRAL: Anemia with profound transfusion requirement since 06/22/2014.      HISTORY OF PRESENT ILLNESS:[name] is a 73 y.o. female who is referred by her family physician and gastroenterologist for anemia requiring a total of 7 units of packed red blood cells over the past 2 months. Her hemoglobin has been chaotic as evidenced from the graph demonstrated below. She has not seen red blood in her stool but does have occasional black stools and never took iron supplements. She denies any hematuria, vaginal bleeding, epistaxis, hemoptysis, or hematochezia. . Her daughter is not sure whether or not fecal occult blood testing has been done. She has not lost weight and denies abdominal pain, nausea, or vomiting but did have abdominal discomfort in February of 2015 at which time H. pylori gastritis was diagnosed. She was treated appropriately. She does have chronic kidney disease. She denies use of NSAIDs and currently takes proton pump inhibitors as well as folic acid and vitamin D supplements. Diabetes is controlled with Levemir and Tradjenta.   PAST MEDICAL HISTORY:  has a past medical history of Type 2 diabetes mellitus; Essential hypertension, benign; History of stroke; MI, old; Seizure disorder; Coronary atherosclerosis of native coronary artery; History of GI bleed; Mixed hyperlipidemia; Ischemic cardiomyopathy; Dementia; Stroke; Seizures; CKD (chronic kidney disease) stage 4, GFR 15-29 ml/min; and CHF (congestive heart failure).     PAST SURGICAL HISTORY: Past Surgical History  Procedure Laterality Date  . Coronary artery bypass graft  July 2002    LIMA to LAD, SVG to OM1, SVG to OM 2, SVG to RCA  . Tee without cardioversion   01/28/2012    Procedure: TRANSESOPHAGEAL ECHOCARDIOGRAM (TEE);  Surgeon: Lelon Perla, MD;  Location: [facility] ENDOSCOPY;  Service: Cardiovascular;  Laterality: [name];  . Colonoscopy  2002  . Esophagogastroduodenoscopy  2002  . Esophagogastroduodenoscopy [name] 01/18/2014    RDE:YCXKG hiatal hernia. Abnormal gastric and duodenal bulbar mucosa of uncertain significance - status post gastric bx (chronic gastritis/H.pylori +  . Colonoscopy [name] 06/25/2014    Procedure: COLONOSCOPY;  Surgeon: Rogene Houston, MD;  Location: [facility] ENDO SU[facility];  Service: Endoscopy;  Laterality: [name];  . Agile capsule [name] 07/06/2014    Procedure: AGILE CAPSULE;  Surgeon: Danie Binder, MD;  Location: [facility] ENDO SUITE;  Service: Endoscopy;  Laterality: [name];  730  . Esophagogastroduodenoscopy [name] 07/20/2014    Procedure: ESOPHAGOGASTRODUODENOSCOPY (EGD);  Surgeon: Daneil Dolin, MD;  Location: [facility] ENDO SU[facility];  Service: Endoscopy;  Laterality: [name];  . Givens capsule study [name] 07/20/2014    Procedure: GIVENS CAPSULE STUDY;  Surgeon: Daneil Dolin, MD;  Location: [facility] ENDO [facility];  Service: Endoscopy;  Laterality: [name];     CURRENT MEDICATIONS: has a current medication list which includes the following prescription(s): calcitriol, donepezil, folic acid, hydralazine, insulin detemir, isosorbide mononitrate, levetiracetam, linagliptin, metoprolol succinate, pantoprazole, potassium chloride, pravastatin, sodium bicarbonate, torsemide, and vitamin d (ergocalciferol).   ALLERGIES: Review of patient's allergies indicates no known allergies.   SOCIAL HISTORY:  reports that she has quit smoking. She does not have any smokeless tobacco history on file. She reports that she does not drink alcohol or use illicit drugs.   FAMILY HISTORY: family history includes Diabetes type II in  her mother; Stroke in her father. There is no history of Colon cancer.    REVIEW OF SYSTEMS:  Other than that discussed above is noncontributory.    PHYSICAL EXAM:   height is 5\' 4"  (1.626 m) and weight is 126 lb 4.8 oz (57.289 kg). Her oral temperature is 97.5 F (36.4 C). Her blood pressure is 142/92 and her pulse is 74. Her respiration is 16 and oxygen saturation is 100%.    GENERAL:alert, no distress and comfortable SKIN: skin color, texture, turgor are normal, no rashes or significant lesions EYES: normal, Conjunctiva are pink and non-injected, sclera clear OROPHARYNX:no exudate, no erythema and lips, buccal mucosa, and tongue normal  NECK: supple, thyroid normal size, non-tender, without nodularity CHEST: Normal AP diameter with no breast masses. LYMPH:  no palpable lymphadenopathy in the cervical, axillary or inguinal LUNGS: clear to auscultation and percussion with normal breathing effort HEART: regular rate & rhythm and no murmurs. Grade 5-2/7 ejection systolic murmur at the lower left sternal border that is nonradiating. ABDOMEN:abdomen soft, non-tender and normal bowel sounds. No hepatomegaly, ascites, or CVA tenderness. MUSCULOSKELETALl:no cyanosis of digits, no clubbing or edema  NEURO: alert & oriented x 3 with fluent speech, no focal motor/sensory deficits. Forgetful of recent events.    LABORATORY DATA:      Transfuse 7 units total of red cells from 06/22/2014 through 07/18/2014 01/16/2014: Ferritin 28 04/29/2014:  Ferritin 52 06/09/2014 WBC 6.0, hemoglobin 5.5, platelets 382,000 06/22/2014: WBC 4.2, hemoglobin 6.4, platelets 237,000 07/22/2014: WBC 4.0, hemoglobin 9.0, platelets 230,000, MCV 92,    Office Visit on 08/14/2014  Component Date Value Ref Range Status  . WBC 08/14/2014 6.9  4.0 - 10.5 K/uL Final  . RBC 08/14/2014 3.21* 3.87 - 5.11 MIL/uL Final  . Hemoglobin 08/14/2014 8.7* 12.0 - 15.0 g/dL Final  . HCT 08/14/2014 27.8* 36.0 - 46.0 % Final  . MCV 08/14/2014 86.6  78.0 - 100.0 fL Final  . MCH 08/14/2014 27.1  26.0 - 34.0 pg Final  . MCHC 08/14/2014 31.3  30.0 - 36.0 g/dL Final  . RDW 08/14/2014 19.0* 11.5 - 15.5 % Final    . Platelets 08/14/2014 488* 150 - 400 K/uL Final  . Neutrophils Relative % 08/14/2014 73  43 - 77 % Final  . Neutro Abs 08/14/2014 5.0  1.7 - 7.7 K/uL Final  . Lymphocytes Relative 08/14/2014 17  12 - 46 % Final  . Lymphs Abs 08/14/2014 1.2  0.7 - 4.0 K/uL Final  . Monocytes Relative 08/14/2014 9  3 - 12 % Final  . Monocytes Absolute 08/14/2014 0.7  0.1 - 1.0 K/uL Final  . Eosinophils Relative 08/14/2014 1  0 - 5 % Final  . Eosinophils Absolute 08/14/2014 0.0  0.0 - 0.7 K/uL Final  . Basophils Relative 08/14/2014 0  0 - 1 % Final  . Basophils Absolute 08/14/2014 0.0  0.0 - 0.1 K/uL Final  Admission on 07/18/2014, Discharged on 07/22/2014  No results displayed because visit has over 200 results.      Urinalysis    Component Value Date/Time   COLORURINE YELLOW 06/09/2014 1439   APPEARANCEUR CLEAR 06/09/2014 1439   LABSPEC 1.015 06/09/2014 1439   PHURINE 5.5 06/09/2014 1439   GLUCOSEU NEGATIVE 06/09/2014 1439   HGBUR NEGATIVE 06/09/2014 1439   BILIRUBINUR NEGATIVE 06/09/2014 1439   [place] 06/09/2014 1439   PROTEINUR 30* 06/09/2014 1439   UROBILINOGEN 0.2 06/09/2014 1439   NITRITE NEGATIVE 06/09/2014 1439   LEUKOCYTESUR NEGATIVE 06/09/2014 1439      @  RADIOGRAPHY: Dg Abd 2 Views  07/25/2014   ADDENDUM REPORT: 07/25/2014 16:19  ADDENDUM: Study discussed by telephone with Nurse Practitioner Laban Emperor on 07/25/2014 at 1559 hrs. She advises that the notes regarding the endoscopy images make it more likely that the capsule did reach the large bowel and was passed, but will follow-up with chest x-ray if necessary.   Electronically Signed   By: Lars Pinks M.D.   On: 07/25/2014 16:19   07/25/2014   CLINICAL DATA:  73 year old female status post PO administration of endoscopy capsule 5 days ago, that did not transit to the cecum. Initial encounter.  EXAM: ABDOMEN - 2 VIEW  COMPARISON:  Chest radiographs 06/09/2014 and earlier.  FINDINGS: Portable AP supine view at 1525 hrs. There are scattered  vascular calcifications throughout the abdomen and pelvis, and probable 9 mm left lower pole region renal calculus, but no retained endoscopy capsule identified. Non obstructed bowel gas pattern. The level of the diaphragm is visible. The patient is status post prior CABG. Evidence of a small right pleural effusion. No acute osseous abnormality identified.  IMPRESSION: 1. No retained endoscopy capsule identified. Multiple hyperdense foci in the abdomen and pelvis, but all appear related to urologic or vascular calcifications. 2. Recommend follow-up chest x-ray (sometimes endoscopy capsules are aspirated into the airway upon ingestion).  Electronically Signed: By: Lars Pinks M.D. On: 07/25/2014 15:53    PATHOLOGY:  for ARIBA, LEHNEN ([id number]) Patient: GEARLDENE, FIORENZA Collected: 01/18/2014 Client: [facility] Accession: [id number] Received: 01/19/2014 Manus Rudd DOB: 03-16-1941 Age: 79 Gender: F Reported: 01/20/2014 [address] Patient Ph: [phone] MRN #: [id number] Linna Hoff [place] [id number] Visit #: [id number] Chart #: Phone: [phone] Fax: CC: REPORT OF SURGICAL PATHOLOGY FINAL DIAGNOSIS Diagnosis Stomach, biopsy - CHRONIC ACTIVE GASTRITIS WITH HELICOBACTER PYLORI. - NO INTESTINAL METAPLASIA, DYSPLASIA, OR MALIGNANCY. Microscopic Comment A Warthin-Starry stain is performed to determine the possibility of the presence of Helicobacter pylori. Organisms of Helicobacter pylori are identified on the Warthin-Starry stain. Vicente Males MD Pathologist, Electronic Signature (Case signed 01/20/2014) Specimen Gross and Clinical Information Specimen(s) Obtained: Stomach, biopsy Specimen Clinical Information Pre-op: profound anemia; Post-op: small hiatal hernia; gastric biopsies Gross Received in formalin are tan, soft tissue fragments that are submitted in toto. Number: 2, Size: 0.3 cm each, (1 B) (KL:kh 01-19-14) Stain(s) used in Diagnosis: The following stain(s) were used in  diagnosing the case: Warthin-Starry Stain. The control(s) stained appropriately. Report signed out from the following location(s) Technical Component performed at [facility] [address],[place] [address].[id number]., Technical Component performed at [address], 1 of 2 FINAL for CYNTHIA, COGLE ([id number]) Report signed out from the following location(s)(continued) [place], [place] [address]. CLIA #: [id number], Interpretation performed at [place], [place], [place] [address]. CLIA #: [id number],    for JUNG, INGERSON ([id number]) Patient: ABBAGAIL, SCAFF Collected: 06/26/2014 Client: [facility] Accession: [id number] Received: 06/26/2014 Hildred Laser DOB: [birth year] Age: 60 Gender: F Reported: 06/27/2014 [address] Patient Ph: [phone] MRN #: [id number] Linna Hoff  [address] Visit #: [id number] Chart #: Phone: [phone] Fax: CC: Eleonore Chiquito, MD REPORT OF SURGICAL PATHOLOGY FINAL DIAGNOSIS Diagnosis Colon, polyp(s), ascending - TUBULAR ADENOMA. - NO HIGH GRADE DYSPLASIA OR MALIGNANCY. Vicente Males MD Pathologist, Electronic Signature (Case signed 06/27/2014) Specimen Gross and Clinical Information Specimen(s) Obtained: Colon, polyp(s), ascending Specimen Clinical Information GI bleed and anemia, diverticulosis, (je) Gross In formalin is a 0.5 cm tan  polyp, bisected, submitted in one block. (SW:ds 06/26/14) Report signed out from the following location(s) Technical Component and Interpretation performed at [place], [place], [place] [address]. CLIA     IMPRESSION:  #1. Episodes of profound anemia requiring transfusion with no significant etiology in gastrointestinal tract I EGD, colonoscopy, or capsule study, most consistent with blood loss as opposed to primary bone marrow disorder, with reactive  thrombocytosis. #2. Chronic kidney disease, on treatment, requiring sodium bicarbonate supplementation. #3. Diabetes mellitus, type II, insulin requiring. #4. Hypertension, controlled. #5. History of H. pylori gastritis in February 2015, treated appropriately, maintain a long-term proton pump inhibitor therapy. #6. Hypercholesterolemia, on treatment.   PLAN:  #1. Ferritin erythropoietin levels will be done today in addition to tests for iron malabsorption as well as inflammatory processes. #2. If ferritin is normal there is a posterior level is normal or low, Procrit will be administered. If ferritin is low, intravenous iron therapy will be given first followed by Procrit. #3. Apparently the patient has been referred to [facility] for enteroscopy. #4. Diagnostically, the patient may benefit from a bleeding scan since she appears to be losing significant amounts of blood in short period of time. #5. Followup in 2 weeks but the patient will be contacted prior to that if additional therapeutic or diagnostic intervention is necessary.    I appreciate the opportunity of sharing in her care.   Doroteo Bradford, MD 08/14/2014 4:37 PM   DISCLAIMER:  This note was dictated with voice recognition softwre.  Similar sounding words can inadvertently be transcribed inaccurately and may not be corrected upon review.               h

## 2014-08-14 NOTE — Progress Notes (Signed)
[name]'s reason for visit today is for labs as scheduled per MD orders.  Venipuncture performed with a 23 gauge butterfly needle to R Cephalic.  [name] tolerated procedure well and without incident; questions were answered and patient was discharged.

## 2014-08-14 NOTE — Patient Instructions (Signed)
[place] Discharge Instructions  RECOMMENDATIONS MADE BY THE CONSULTANT AND ANY TEST RESULTS WILL BE SENT TO YOUR REFERRING PHYSICIAN.  EXAM FINDINGS BY THE PHYSICIAN TODAY AND SIGNS OR SYMPTOMS TO REPORT TO CLINIC OR PRIMARY PHYSICIAN: You seen Dr Barnet Glasgow today  Follow up in 2 weeks with cbc  Thank you for choosing [facility] to provide your oncology and hematology care.  To afford each patient quality time with our providers, please arrive at least 15 minutes before your scheduled appointment time.  With your help, our goal is to use those 15 minutes to complete the necessary work-up to ensure our physicians have the information they need to help with your evaluation and healthcare recommendations.    Effective January 1st, 2014, we ask that you re-schedule your appointment with our physicians should you arrive 10 or more minutes late for your appointment.  We strive to give you quality time with our providers, and arriving late affects you and other patients whose appointments are after yours.    Again, thank you for choosing [facility].  Our hope is that these requests will decrease the amount of time that you wait before being seen by our physicians.       _____________________________________________________________  Should you have questions after your visit to [facility], please contact our office at [phone] between the hours of 8:30 a.m. and 5:00 p.m.  Voicemails left after 4:30 p.m. will not be returned until the following business day.  For prescription refill requests, have your pharmacy contact our office with your prescription refill request.

## 2014-08-15 LAB — VITAMIN B12: Vitamin B-12: 959 pg/mL — ABNORMAL HIGH (ref 211–911)

## 2014-08-15 LAB — FERRITIN: FERRITIN: 196 ng/mL (ref 10–291)

## 2014-08-15 LAB — ANTI-NUCLEAR AB-TITER (ANA TITER): ANA Titer 1: 1:160 {titer} — ABNORMAL HIGH

## 2014-08-15 LAB — FOLATE

## 2014-08-15 LAB — ANA: ANA: POSITIVE — AB

## 2014-08-16 LAB — PROTEIN ELECTROPHORESIS, SERUM
Albumin ELP: 34.6 % — ABNORMAL LOW (ref 55.8–66.1)
Alpha-1-Globulin: 6.4 % — ABNORMAL HIGH (ref 2.9–4.9)
Alpha-2-Globulin: 13.3 % — ABNORMAL HIGH (ref 7.1–11.8)
BETA GLOBULIN: 5.2 % (ref 4.7–7.2)
Beta 2: 7.7 % — ABNORMAL HIGH (ref 3.2–6.5)
GAMMA GLOBULIN: 32.8 % — AB (ref 11.1–18.8)
M-SPIKE, %: NOT DETECTED g/dL
Total Protein ELP: 9.3 g/dL — ABNORMAL HIGH (ref 6.0–8.3)

## 2014-08-16 LAB — ERYTHROPOIETIN: ERYTHROPOIETIN: 32.9 m[IU]/mL — AB (ref 2.6–18.5)

## 2014-08-16 LAB — INTRINSIC FACTOR ANTIBODIES: Intrinsic Factor: NEGATIVE

## 2014-08-17 LAB — ANTI-PARIETAL ANTIBODY: PARIETAL CELL ANTIBODY-IGG: NEGATIVE

## 2014-08-29 ENCOUNTER — Ambulatory Visit (HOSPITAL_COMMUNITY): Payer: Medicare Other

## 2014-08-29 ENCOUNTER — Other Ambulatory Visit (HOSPITAL_COMMUNITY): Payer: Medicare Other

## 2014-09-04 ENCOUNTER — Encounter (HOSPITAL_COMMUNITY): Payer: Self-pay

## 2014-09-04 ENCOUNTER — Encounter (HOSPITAL_BASED_OUTPATIENT_CLINIC_OR_DEPARTMENT_OTHER): Payer: Medicare Other

## 2014-09-04 ENCOUNTER — Encounter (HOSPITAL_COMMUNITY): Payer: Medicare Other | Attending: Hematology and Oncology

## 2014-09-04 VITALS — HR 80 | Temp 97.7°F | Resp 16 | Wt 135.0 lb

## 2014-09-04 DIAGNOSIS — E780 Pure hypercholesterolemia: Secondary | ICD-10-CM | POA: Insufficient documentation

## 2014-09-04 DIAGNOSIS — I252 Old myocardial infarction: Secondary | ICD-10-CM | POA: Diagnosis not present

## 2014-09-04 DIAGNOSIS — Z87898 Personal history of other specified conditions: Secondary | ICD-10-CM | POA: Diagnosis not present

## 2014-09-04 DIAGNOSIS — N189 Chronic kidney disease, unspecified: Principal | ICD-10-CM

## 2014-09-04 DIAGNOSIS — I129 Hypertensive chronic kidney disease with stage 1 through stage 4 chronic kidney disease, or unspecified chronic kidney disease: Secondary | ICD-10-CM | POA: Diagnosis not present

## 2014-09-04 DIAGNOSIS — Z794 Long term (current) use of insulin: Secondary | ICD-10-CM | POA: Insufficient documentation

## 2014-09-04 DIAGNOSIS — I10 Essential (primary) hypertension: Secondary | ICD-10-CM

## 2014-09-04 DIAGNOSIS — Z87891 Personal history of nicotine dependence: Secondary | ICD-10-CM | POA: Diagnosis not present

## 2014-09-04 DIAGNOSIS — F0390 Unspecified dementia without behavioral disturbance: Secondary | ICD-10-CM | POA: Diagnosis not present

## 2014-09-04 DIAGNOSIS — E119 Type 2 diabetes mellitus without complications: Secondary | ICD-10-CM | POA: Diagnosis not present

## 2014-09-04 DIAGNOSIS — N184 Chronic kidney disease, stage 4 (severe): Secondary | ICD-10-CM

## 2014-09-04 DIAGNOSIS — D649 Anemia, unspecified: Secondary | ICD-10-CM | POA: Diagnosis present

## 2014-09-04 DIAGNOSIS — Z79899 Other long term (current) drug therapy: Secondary | ICD-10-CM | POA: Insufficient documentation

## 2014-09-04 DIAGNOSIS — Z951 Presence of aortocoronary bypass graft: Secondary | ICD-10-CM | POA: Diagnosis not present

## 2014-09-04 DIAGNOSIS — D631 Anemia in chronic kidney disease: Secondary | ICD-10-CM | POA: Diagnosis not present

## 2014-09-04 DIAGNOSIS — I509 Heart failure, unspecified: Secondary | ICD-10-CM | POA: Diagnosis not present

## 2014-09-04 DIAGNOSIS — Z8673 Personal history of transient ischemic attack (TIA), and cerebral infarction without residual deficits: Secondary | ICD-10-CM | POA: Insufficient documentation

## 2014-09-04 DIAGNOSIS — R569 Unspecified convulsions: Secondary | ICD-10-CM | POA: Insufficient documentation

## 2014-09-04 DIAGNOSIS — I2510 Atherosclerotic heart disease of native coronary artery without angina pectoris: Secondary | ICD-10-CM | POA: Diagnosis not present

## 2014-09-04 LAB — CBC WITH DIFFERENTIAL/PLATELET
BASOS PCT: 1 % (ref 0–1)
Basophils Absolute: 0 10*3/uL (ref 0.0–0.1)
EOS ABS: 0.1 10*3/uL (ref 0.0–0.7)
EOS PCT: 1 % (ref 0–5)
HCT: 27.6 % — ABNORMAL LOW (ref 36.0–46.0)
Hemoglobin: 8.6 g/dL — ABNORMAL LOW (ref 12.0–15.0)
Lymphocytes Relative: 21 % (ref 12–46)
Lymphs Abs: 1.1 10*3/uL (ref 0.7–4.0)
MCH: 26.6 pg (ref 26.0–34.0)
MCHC: 31.2 g/dL (ref 30.0–36.0)
MCV: 85.4 fL (ref 78.0–100.0)
MONOS PCT: 6 % (ref 3–12)
Monocytes Absolute: 0.3 10*3/uL (ref 0.1–1.0)
NEUTROS ABS: 3.9 10*3/uL (ref 1.7–7.7)
Neutrophils Relative %: 71 % (ref 43–77)
Platelets: 365 10*3/uL (ref 150–400)
RBC: 3.23 MIL/uL — ABNORMAL LOW (ref 3.87–5.11)
RDW: 19.9 % — AB (ref 11.5–15.5)
WBC: 5.5 10*3/uL (ref 4.0–10.5)

## 2014-09-04 MED ORDER — DARBEPOETIN ALFA-POLYSORBATE 500 MCG/ML IJ SOLN
INTRAMUSCULAR | Status: AC
  Filled 2014-09-04: qty 1

## 2014-09-04 MED ORDER — DARBEPOETIN ALFA-POLYSORBATE 500 MCG/ML IJ SOLN
500.0000 ug | Freq: Once | INTRAMUSCULAR | Status: AC
  Administered 2014-09-04: 500 ug via SUBCUTANEOUS

## 2014-09-04 NOTE — Patient Instructions (Signed)
[place] Discharge Instructions  RECOMMENDATIONS MADE BY THE CONSULTANT AND ANY TEST RESULTS WILL BE SENT TO YOUR REFERRING PHYSICIAN.  Your hemoglobin today is 8.6 - we will give you Aranesp to try to get that number to a higher level. Follow-up in 3 weeks for blood work and possible Aranesp injection. Office visit in 6 weeks.   Thank you for choosing [facility] to provide your oncology and hematology care.  To afford each patient quality time with our providers, please arrive at least 15 minutes before your scheduled appointment time.  With your help, our goal is to use those 15 minutes to complete the necessary work-up to ensure our physicians have the information they need to help with your evaluation and healthcare recommendations.    Effective January 1st, 2014, we ask that you re-schedule your appointment with our physicians should you arrive 10 or more minutes late for your appointment.  We strive to give you quality time with our providers, and arriving late affects you and other patients whose appointments are after yours.    Again, thank you for choosing [facility].  Our hope is that these requests will decrease the amount of time that you wait before being seen by our physicians.       _____________________________________________________________  Should you have questions after your visit to [facility], please contact our office at [phone] between the hours of 8:30 a.m. and 4:30 p.m.  Voicemails left after 4:30 p.m. will not be returned until the following business day.  For prescription refill requests, have your pharmacy contact our office with your prescription refill request.    _______________________________________________________________  We hope that we have given you very good care.  You may receive a patient satisfaction survey in the mail, please complete it and return it as soon as possible.  We  value your feedback!  _______________________________________________________________  Have you asked about our STAR program?  STAR stands for Survivorship Training and Rehabilitation, and this is a nationally recognized cancer care program that focuses on survivorship and rehabilitation.  Cancer and cancer treatments may cause problems, such as, pain, making you feel tired and keeping you from doing the things that you need or want to do. Cancer rehabilitation can help. Our goal is to reduce these troubling effects and help you have the best quality of life possible.  You may receive a survey from a nurse that asks questions about your current state of health.  Based on the survey results, all eligible patients will be referred to the [facility] program for an evaluation so we can better serve you!  A frequently asked questions sheet is available upon request.

## 2014-09-04 NOTE — Progress Notes (Signed)
[name]  OFFICE PROGRESS NOTE  [name], MD [address] [place] [address]  DIAGNOSIS: Anemia in stage 4 chronic kidney disease - Plan: ARANESP TREATMENT CONDITION, darbepoetin alfa-polysorbate (ARANESP) injection 500 mcg  Anemia in chronic kidney disease  Chief Complaint  Patient presents with  . anemia in chronic kidney disease    CURRENT THERAPY: Anemia workup completed  INTERVAL HISTORY: [name] 73 y.o. female returns for followup after additional workup for transfusion-dependent anemia requiring 7 units of packed red blood cells over the past 2 months.  She denies any episodes of melena, hematochezia, hematuria, vaginal bleeding, hemoptysis, or epistaxis. She denies any increasing shortness of breath, PND, orthopnea, palpitations, lower extremity swelling or redness, focal weakness, skin rash, headache, or seizures.  MEDICAL HISTORY: Past Medical History  Diagnosis Date  . Type 2 diabetes mellitus   . Essential hypertension, benign   . History of stroke     Previously on Coumadin  . MI, old     Reported 77  . Seizure disorder   . Coronary atherosclerosis of native coronary artery     Multivessel status post CABG 2002  . History of GI bleed     Erosive gastritis and duodenitis by EGD 2002  . Mixed hyperlipidemia   . Ischemic cardiomyopathy     LVEF 40-45% March 2013  . Dementia   . Stroke   . Seizures   . CKD (chronic kidney disease) stage 4, GFR 15-29 ml/min   . CHF (congestive heart failure)     INTERIM HISTORY: has Acute CVA Left frontal lobe,; Hypertension; Diabetes mellitus; Seizure disorder; ARF (acute renal failure); Neutropenia; CVA (cerebral infarction); Hyperglycemia; CKD (chronic kidney disease) stage 3, GFR 30-59 ml/min; Diabetes; Dehydration; Bilateral lower extremity edema; GI bleeding; Weakness generalized; Anemia associated with acute blood loss; NSTEMI (non-ST elevated  myocardial infarction); Systolic CHF, chronic last EF 40% 3/13; Iron deficiency anemia; Diarrhea; Edema of right lower extremity; CHF, acute on chronic; Dementia; CHF (congestive heart failure); Severe mitral regurgitation; Acute on chronic combined systolic and diastolic congestive heart failure; Chronic kidney disease (CKD), stage IV (severe); CAP (community acquired pneumonia); Hypoglycemia; Choking episode; Severe anemia; Cardiomyopathy, ischemic; Acute renal failure superimposed on stage 4 chronic kidney disease; and Anemia in stage 4 chronic kidney disease on her problem list.    ALLERGIES:  has No Known Allergies.  MEDICATIONS: has a current medication list which includes the following prescription(s): calcitriol, donepezil, folic acid, hydralazine, insulin detemir, isosorbide mononitrate, levetiracetam, linagliptin, metoprolol succinate, pantoprazole, potassium chloride, pravastatin, sodium bicarbonate, torsemide, and vitamin d (ergocalciferol).  SURGICAL HISTORY:  Past Surgical History  Procedure Laterality Date  . Coronary artery bypass graft  July 2002    LIMA to LAD, SVG to OM1, SVG to OM 2, SVG to RCA  . Tee without cardioversion  01/28/2012    Procedure: TRANSESOPHAGEAL ECHOCARDIOGRAM (TEE);  Surgeon: Lelon Perla, MD;  Location: [facility] ENDOSCOPY;  Service: Cardiovascular;  Laterality: [name];  . Colonoscopy  2002  . Esophagogastroduodenoscopy  2002  . Esophagogastroduodenoscopy [name] 01/18/2014    JOA:CZYSA hiatal hernia. Abnormal gastric and duodenal bulbar mucosa of uncertain significance - status post gastric bx (chronic gastritis/H.pylori +  . Colonoscopy [name] 06/25/2014    Procedure: COLONOSCOPY;  Surgeon: Rogene Houston, MD;  Location: [facility] ENDO SUITE;  Service: Endoscopy;  Laterality: [name];  . Agile capsule [name] 07/06/2014    Procedure: AGILE CAPSULE;  Surgeon: Carlyon Prows  Rexene Edison, MD;  Location: [facility] ENDO SUITE;  Service: Endoscopy;  Laterality: [name];  730  . Esophagogastroduodenoscopy [name]  07/20/2014    Procedure: ESOPHAGOGASTRODUODENOSCOPY (EGD);  Surgeon: Daneil Dolin, MD;  Location: [facility] ENDO SUITE;  Service: Endoscopy;  Laterality: [name];  . Givens capsule study [name] 07/20/2014    Procedure: GIVENS CAPSULE STUDY;  Surgeon: Daneil Dolin, MD;  Location: [facility] ENDO SUITE;  Service: Endoscopy;  Laterality: [name];    FAMILY HISTORY: family history includes Diabetes type II in her mother; Stroke in her father. There is no history of Colon cancer.  SOCIAL HISTORY:  reports that she has quit smoking. She does not have any smokeless tobacco history on file. She reports that she does not drink alcohol or use illicit drugs.  REVIEW OF SYSTEMS:  Other than that discussed above is noncontributory.  PHYSICAL EXAMINATION: ECOG PERFORMANCE STATUS: 1 - Symptomatic but completely ambulatory  Pulse 80, temperature 97.7 F (36.5 C), temperature source Oral, resp. rate 16, weight 135 lb (61.236 kg), SpO2 98.00%.  GENERAL:alert, no distress and comfortable SKIN: skin color, texture, turgor are normal, no rashes or significant lesions EYES: PERLA; Conjunctiva are pink and non-injected, sclera clear SINUSES: No redness or tenderness over maxillary or ethmoid sinuses OROPHARYNX:no exudate, no erythema on lips, buccal mucosa, or tongue. NECK: supple, thyroid normal size, non-tender, without nodularity. No masses CHEST: Normal AP diameter with no breast masses. LYMPH:  no palpable lymphadenopathy in the cervical, axillary or inguinal LUNGS: clear to auscultation and percussion with normal breathing effort HEART: Grade 1-3/2 ejection systolic murmur at the lower left sternal border, nonradiating.. ABDOMEN:abdomen soft, non-tender and normal bowel sounds. No hepatosplenomegaly, ascites, or CVA tenderness. MUSCULOSKELETAL:no cyanosis of digits and no clubbing. Range of motion normal.  NEURO: alert & oriented x 3 with fluent speech, no focal motor/sensory deficits. Forgetful of recent  events.   LABORATORY DATA:   Transfuse 7 units total of red cells from 06/22/2014 through 07/18/2014    Lab on 09/04/2014  Component Date Value Ref Range Status  . WBC 09/04/2014 5.5  4.0 - 10.5 K/uL Final  . RBC 09/04/2014 3.23* 3.87 - 5.11 MIL/uL Final  . Hemoglobin 09/04/2014 8.6* 12.0 - 15.0 g/dL Final  . HCT 09/04/2014 27.6* 36.0 - 46.0 % Final  . MCV 09/04/2014 85.4  78.0 - 100.0 fL Final  . MCH 09/04/2014 26.6  26.0 - 34.0 pg Final  . MCHC 09/04/2014 31.2  30.0 - 36.0 g/dL Final  . RDW 09/04/2014 19.9* 11.5 - 15.5 % Final  . Platelets 09/04/2014 365  150 - 400 K/uL Final  . Neutrophils Relative % 09/04/2014 71  43 - 77 % Final  . Neutro Abs 09/04/2014 3.9  1.7 - 7.7 K/uL Final  . Lymphocytes Relative 09/04/2014 21  12 - 46 % Final  . Lymphs Abs 09/04/2014 1.1  0.7 - 4.0 K/uL Final  . Monocytes Relative 09/04/2014 6  3 - 12 % Final  . Monocytes Absolute 09/04/2014 0.3  0.1 - 1.0 K/uL Final  . Eosinophils Relative 09/04/2014 1  0 - 5 % Final  . Eosinophils Absolute 09/04/2014 0.1  0.0 - 0.7 K/uL Final  . Basophils Relative 09/04/2014 1  0 - 1 % Final  . Basophils Absolute 09/04/2014 0.0  0.0 - 0.1 K/uL Final  Office Visit on 08/14/2014  Component Date Value Ref Range Status  . WBC 08/14/2014 6.9  4.0 - 10.5 K/uL Final  . RBC 08/14/2014 3.21* 3.87 - 5.11 MIL/uL Final  .  Hemoglobin 08/14/2014 8.7* 12.0 - 15.0 g/dL Final  . HCT 08/14/2014 27.8* 36.0 - 46.0 % Final  . MCV 08/14/2014 86.6  78.0 - 100.0 fL Final  . MCH 08/14/2014 27.1  26.0 - 34.0 pg Final  . MCHC 08/14/2014 31.3  30.0 - 36.0 g/dL Final  . RDW 08/14/2014 19.0* 11.5 - 15.5 % Final  . Platelets 08/14/2014 488* 150 - 400 K/uL Final  . Neutrophils Relative % 08/14/2014 73  43 - 77 % Final  . Neutro Abs 08/14/2014 5.0  1.7 - 7.7 K/uL Final  . Lymphocytes Relative 08/14/2014 17  12 - 46 % Final  . Lymphs Abs 08/14/2014 1.2  0.7 - 4.0 K/uL Final  . Monocytes Relative 08/14/2014 9  3 - 12 % Final  . Monocytes  Absolute 08/14/2014 0.7  0.1 - 1.0 K/uL Final  . Eosinophils Relative 08/14/2014 1  0 - 5 % Final  . Eosinophils Absolute 08/14/2014 0.0  0.0 - 0.7 K/uL Final  . Basophils Relative 08/14/2014 0  0 - 1 % Final  . Basophils Absolute 08/14/2014 0.0  0.0 - 0.1 K/uL Final  . Sodium 08/14/2014 146  137 - 147 mEq/L Final  . Potassium 08/14/2014 3.8  3.7 - 5.3 mEq/L Final  . Chloride 08/14/2014 105  96 - 112 mEq/L Final  . CO2 08/14/2014 29  19 - 32 mEq/L Final  . Glucose, Bld 08/14/2014 119* 70 - 99 mg/dL Final  . BUN 08/14/2014 55* 6 - 23 mg/dL Final  . Creatinine, Ser 08/14/2014 2.50* 0.50 - 1.10 mg/dL Final  . Calcium 08/14/2014 9.5  8.4 - 10.5 mg/dL Final  . Total Protein 08/14/2014 10.3* 6.0 - 8.3 g/dL Final  . Albumin 08/14/2014 2.8* 3.5 - 5.2 g/dL Final  . AST 08/14/2014 24  0 - 37 U/L Final  . ALT 08/14/2014 10  0 - 35 U/L Final  . Alkaline Phosphatase 08/14/2014 91  39 - 117 U/L Final  . Total Bilirubin 08/14/2014 0.2* 0.3 - 1.2 mg/dL Final  . GFR calc non Af Amer 08/14/2014 18* >90 mL/min Final  . GFR calc Af Amer 08/14/2014 21* >90 mL/min Final   Comment: (NOTE)                          The eGFR has been calculated using the CKD EPI equation.                          This calculation has not been validated in all clinical situations.                          eGFR's persistently <90 mL/min signify possible Chronic Kidney                          Disease.  . Anion gap 08/14/2014 12  5 - 15 Final  . LDH 08/14/2014 236  94 - 250 U/L Final  . Total Protein ELP 08/14/2014 9.3* 6.0 - 8.3 g/dL Final  . Albumin ELP 08/14/2014 34.6* 55.8 - 66.1 % Final  . Alpha-1-Globulin 08/14/2014 6.4* 2.9 - 4.9 % Final  . Alpha-2-Globulin 08/14/2014 13.3* 7.1 - 11.8 % Final  . Beta Globulin 08/14/2014 5.2  4.7 - 7.2 % Final  . Beta 2 08/14/2014 7.7* 3.2 - 6.5 % Final  . Gamma Globulin 08/14/2014 32.8* 11.1 - 18.8 %  Final  . M-Spike, % 08/14/2014 NOT DETECTED   Final  . SPE Interp. 08/14/2014 (NOTE)    Final   Comment: The possibility of a faint restricted band(s) cannot be completely                          excluded in the BETA-2 and GAMMA region.                          Results are consistent with SPE performed on 08/08/2013                           Reviewed by Odis Hollingshead, MD, PhD, FCAP (Electronic Signature on                          File)  . Comment 08/14/2014 (NOTE)   Final   Comment: ---------------                          Serum protein electrophoresis is a useful screening procedure in the                          detection of various pathophysiologic states such as inflammation,                          gammopathies, protein loss and other dysproteinemias.  Immunofixation                          electrophoresis (IFE) is a more sensitive technique for the                          identification of M-proteins found in patients with monoclonal                          gammopathy of unknown significance (MGUS), amyloidosis, early or                          treated myeloma or macroglobulinemia, solitary plasmacytoma or                          extramedullary plasmacytoma.                          Performed at [facility]  . Erythropoietin 08/14/2014 32.9* 2.6 - 18.5 mIU/mL Final   Performed at [facility]  . Ferritin 08/14/2014 196  10 - 291 ng/mL Final   Performed at [facility]  . Iron 08/14/2014 27* 42 - 135 ug/dL Final  . TIBC 08/14/2014 252  250 - 470 ug/dL Final  . Saturation Ratios 08/14/2014 11* 20 - 55 % Final  . UIBC 08/14/2014 225  125 - 400 ug/dL Final   Performed at [facility]  . Vitamin B-12 08/14/2014 959* 211 - 911 pg/mL Final   Performed at [facility]  . Folate 08/14/2014 >20.0   Final   Comment: (NOTE)  Reference Ranges                                 Deficient:       0.4 - 3.3 ng/mL                                 Indeterminate:   3.4 - 5.4 ng/mL                                  Normal:              > 5.4 ng/mL                          Performed at [facility]  . ANA 08/14/2014 POSITIVE* NEGATIVE Final   Performed at [facility]  . Parietal Cell Antibody-IgG 08/14/2014 Negative  Negative Final   Performed at [facility]  . Intrinsic Factor 08/14/2014 Negative  Negative Final   Performed at [facility]  . ANA Titer 1 08/14/2014 1:160* <1:40 Final   Comment: (NOTE)                          Reference Ranges:                          1:40 - 1:80 Weakly positive, usually not clinically significant.                          > or = to 1:160 Result may be clinically significant.                                                                                                 . ANA Pattern 1 08/14/2014 HOMOGENOUS   Final   Performed at [place]: Peripheral smear with normocytic normochromic red cells  Urinalysis    Component Value Date/Time   COLORURINE YELLOW 06/09/2014 [place] 06/09/2014 1439   LABSPEC 1.015 06/09/2014 1439   PHURINE 5.5 06/09/2014 1439   GLUCOSEU NEGATIVE 06/09/2014 1439   HGBUR NEGATIVE 06/09/2014 1439   BILIRUBINUR NEGATIVE 06/09/2014 1439   KETONESUR NEGATIVE 06/09/2014 1439   PROTEINUR 30* 06/09/2014 1439   UROBILINOGEN 0.2 06/09/2014 1439   NITRITE NEGATIVE 06/09/2014 1439   LEUKOCYTESUR NEGATIVE 06/09/2014 1439    RADIOGRAPHIC STUDIES: No results found.  ASSESSMENT:  #1. Anemia in chronic renal disease plus chronic blood loss with replenishment of iron stores to do receiving 7 units of red cell transfusions. #2. Chronic kidney disease, stage IV. #3. Diabetes mellitus, type II, insulin requiring. #4. Hypertension, controlled. #5. History of H. pylori gastritis and femora 2015, treated appropriately, maintained on long-term proton pump inhibitor therapy.   PLAN:  #1. Aranesp 500 mcg  subcutaneously every 3 weeks as long as hemoglobin is less than 11. #2. Followup  in 6 weeks with CBC and ferritin. #3. Followup with appointment at [facility] for enteroscopy.   All questions were answered. The patient knows to call the clinic with any problems, questions or concerns. We can certainly see the patient much sooner if necessary.   I spent 25 minutes counseling the patient face to face. The total time spent in the appointment was 30 minutes.    Doroteo Bradford, MD 09/04/2014 4:14 PM  DISCLAIMER:  This note was dictated with voice recognition software.  Similar sounding words can inadvertently be transcribed inaccurately and may not be corrected upon review.

## 2014-09-04 NOTE — Progress Notes (Signed)
1435:  [name] presented for labwork. Labs per MD order drawn via Peripheral Line 21 gauge needle inserted in right hand  Good blood return present. Procedure without incident.  Needle removed intact. Patient tolerated procedure well.

## 2014-09-25 ENCOUNTER — Other Ambulatory Visit (HOSPITAL_COMMUNITY): Payer: Self-pay | Admitting: Oncology

## 2014-09-25 ENCOUNTER — Encounter (HOSPITAL_BASED_OUTPATIENT_CLINIC_OR_DEPARTMENT_OTHER): Payer: Medicare Other

## 2014-09-25 ENCOUNTER — Encounter (HOSPITAL_COMMUNITY): Payer: Self-pay

## 2014-09-25 ENCOUNTER — Encounter (HOSPITAL_COMMUNITY): Payer: Medicare Other | Attending: Hematology and Oncology

## 2014-09-25 VITALS — BP 146/71 | HR 83 | Temp 98.2°F | Resp 18

## 2014-09-25 DIAGNOSIS — D62 Acute posthemorrhagic anemia: Secondary | ICD-10-CM

## 2014-09-25 DIAGNOSIS — Z794 Long term (current) use of insulin: Secondary | ICD-10-CM | POA: Insufficient documentation

## 2014-09-25 DIAGNOSIS — E780 Pure hypercholesterolemia: Secondary | ICD-10-CM | POA: Diagnosis not present

## 2014-09-25 DIAGNOSIS — Z951 Presence of aortocoronary bypass graft: Secondary | ICD-10-CM | POA: Diagnosis not present

## 2014-09-25 DIAGNOSIS — I2510 Atherosclerotic heart disease of native coronary artery without angina pectoris: Secondary | ICD-10-CM | POA: Diagnosis not present

## 2014-09-25 DIAGNOSIS — I129 Hypertensive chronic kidney disease with stage 1 through stage 4 chronic kidney disease, or unspecified chronic kidney disease: Secondary | ICD-10-CM | POA: Diagnosis not present

## 2014-09-25 DIAGNOSIS — D631 Anemia in chronic kidney disease: Secondary | ICD-10-CM

## 2014-09-25 DIAGNOSIS — Z8673 Personal history of transient ischemic attack (TIA), and cerebral infarction without residual deficits: Secondary | ICD-10-CM | POA: Diagnosis not present

## 2014-09-25 DIAGNOSIS — Z87891 Personal history of nicotine dependence: Secondary | ICD-10-CM | POA: Diagnosis not present

## 2014-09-25 DIAGNOSIS — F0390 Unspecified dementia without behavioral disturbance: Secondary | ICD-10-CM | POA: Insufficient documentation

## 2014-09-25 DIAGNOSIS — D649 Anemia, unspecified: Secondary | ICD-10-CM | POA: Diagnosis not present

## 2014-09-25 DIAGNOSIS — I252 Old myocardial infarction: Secondary | ICD-10-CM | POA: Insufficient documentation

## 2014-09-25 DIAGNOSIS — E119 Type 2 diabetes mellitus without complications: Secondary | ICD-10-CM | POA: Diagnosis not present

## 2014-09-25 DIAGNOSIS — R569 Unspecified convulsions: Secondary | ICD-10-CM | POA: Insufficient documentation

## 2014-09-25 DIAGNOSIS — Z87898 Personal history of other specified conditions: Secondary | ICD-10-CM | POA: Insufficient documentation

## 2014-09-25 DIAGNOSIS — Z79899 Other long term (current) drug therapy: Secondary | ICD-10-CM | POA: Insufficient documentation

## 2014-09-25 DIAGNOSIS — N184 Chronic kidney disease, stage 4 (severe): Secondary | ICD-10-CM | POA: Diagnosis not present

## 2014-09-25 DIAGNOSIS — N189 Chronic kidney disease, unspecified: Principal | ICD-10-CM

## 2014-09-25 DIAGNOSIS — D509 Iron deficiency anemia, unspecified: Secondary | ICD-10-CM

## 2014-09-25 DIAGNOSIS — I509 Heart failure, unspecified: Secondary | ICD-10-CM | POA: Insufficient documentation

## 2014-09-25 LAB — CBC WITH DIFFERENTIAL/PLATELET
Basophils Absolute: 0 10*3/uL (ref 0.0–0.1)
Basophils Relative: 0 % (ref 0–1)
EOS ABS: 0.1 10*3/uL (ref 0.0–0.7)
EOS PCT: 1 % (ref 0–5)
HCT: 25 % — ABNORMAL LOW (ref 36.0–46.0)
Hemoglobin: 7.5 g/dL — ABNORMAL LOW (ref 12.0–15.0)
Lymphocytes Relative: 13 % (ref 12–46)
Lymphs Abs: 0.8 10*3/uL (ref 0.7–4.0)
MCH: 24.1 pg — AB (ref 26.0–34.0)
MCHC: 30 g/dL (ref 30.0–36.0)
MCV: 80.4 fL (ref 78.0–100.0)
MONO ABS: 0.4 10*3/uL (ref 0.1–1.0)
Monocytes Relative: 7 % (ref 3–12)
NEUTROS ABS: 4.9 10*3/uL (ref 1.7–7.7)
Neutrophils Relative %: 79 % — ABNORMAL HIGH (ref 43–77)
Platelets: 357 10*3/uL (ref 150–400)
RBC: 3.11 MIL/uL — ABNORMAL LOW (ref 3.87–5.11)
RDW: 20.3 % — ABNORMAL HIGH (ref 11.5–15.5)
WBC: 6.2 10*3/uL (ref 4.0–10.5)

## 2014-09-25 LAB — PREPARE RBC (CROSSMATCH)

## 2014-09-25 MED ORDER — DARBEPOETIN ALFA 500 MCG/ML IJ SOSY
500.0000 ug | PREFILLED_SYRINGE | Freq: Once | INTRAMUSCULAR | Status: AC
  Administered 2014-09-25: 500 ug via SUBCUTANEOUS

## 2014-09-25 MED ORDER — DARBEPOETIN ALFA 500 MCG/ML IJ SOSY
PREFILLED_SYRINGE | INTRAMUSCULAR | Status: AC
  Filled 2014-09-25: qty 1

## 2014-09-25 NOTE — Patient Instructions (Signed)
[facility] Discharge Instructions  RECOMMENDATIONS MADE BY THE CONSULTANT AND ANY TEST RESULTS WILL BE SENT TO YOUR REFERRING PHYSICIAN.  You received Aranesp today for your low hemoglobin (7.5).  Return tomorrow for blood transfusion as scheduled.   Thank you for choosing [place] [facility] to provide your oncology and hematology care.  To afford each patient quality time with our providers, please arrive at least 15 minutes before your scheduled appointment time.  With your help, our goal is to use those 15 minutes to complete the necessary work-up to ensure our physicians have the information they need to help with your evaluation and healthcare recommendations.    Effective January 1st, 2014, we ask that you re-schedule your appointment with our physicians should you arrive 10 or more minutes late for your appointment.  We strive to give you quality time with our providers, and arriving late affects you and other patients whose appointments are after yours.    Again, thank you for choosing [facility].  Our hope is that these requests will decrease the amount of time that you wait before being seen by our physicians.       _____________________________________________________________  Should you have questions after your visit to [facility], please contact our office at [phone] between the hours of 8:30 a.m. and 4:30 p.m.  Voicemails left after 4:30 p.m. will not be returned until the following business day.  For prescription refill requests, have your pharmacy contact our office with your prescription refill request.    _______________________________________________________________  We hope that we have given you very good care.  You may receive a patient satisfaction survey in the mail, please complete it and return it as soon as possible.  We value your  feedback!  _______________________________________________________________  Have you asked about our STAR program?  STAR stands for Survivorship Training and Rehabilitation, and this is a nationally recognized cancer care program that focuses on survivorship and rehabilitation.  Cancer and cancer treatments may cause problems, such as, pain, making you feel tired and keeping you from doing the things that you need or want to do. Cancer rehabilitation can help. Our goal is to reduce these troubling effects and help you have the best quality of life possible.  You may receive a survey from a nurse that asks questions about your current state of health.  Based on the survey results, all eligible patients will be referred to the [facility] program for an evaluation so we can better serve you!  A frequently asked questions sheet is available upon request.

## 2014-09-25 NOTE — Progress Notes (Signed)
LABS FOR [facility] 

## 2014-09-25 NOTE — Addendum Note (Signed)
Addended by: Joie Bimler on: 09/25/2014 02:58 PM   Modules accepted: Orders

## 2014-09-25 NOTE — Progress Notes (Signed)
Kirby Crigler, PA notified of pt's Hgb of 7.5.  Per his orders, Aranesp 500 mcg administered SQ LLQ. Was also instructed to offer pt blood transfusion for tomorrow if symptomatic; per family member and pt, pt has been more fatigued, c/o generalized weakness, and shortness of breath. Scheduled to receive 2 units PRBCs tomorrow, 09/26/14.

## 2014-09-26 ENCOUNTER — Encounter (HOSPITAL_BASED_OUTPATIENT_CLINIC_OR_DEPARTMENT_OTHER): Payer: Medicare Other

## 2014-09-26 ENCOUNTER — Encounter (HOSPITAL_COMMUNITY): Payer: Self-pay

## 2014-09-26 VITALS — BP 156/76 | HR 76 | Temp 97.2°F | Resp 16 | Wt 128.2 lb

## 2014-09-26 DIAGNOSIS — D509 Iron deficiency anemia, unspecified: Secondary | ICD-10-CM

## 2014-09-26 DIAGNOSIS — N184 Chronic kidney disease, stage 4 (severe): Secondary | ICD-10-CM

## 2014-09-26 DIAGNOSIS — D631 Anemia in chronic kidney disease: Secondary | ICD-10-CM

## 2014-09-26 DIAGNOSIS — D62 Acute posthemorrhagic anemia: Secondary | ICD-10-CM

## 2014-09-26 DIAGNOSIS — D649 Anemia, unspecified: Secondary | ICD-10-CM

## 2014-09-26 MED ORDER — ACETAMINOPHEN 325 MG PO TABS
650.0000 mg | ORAL_TABLET | Freq: Once | ORAL | Status: AC
  Administered 2014-09-26: 650 mg via ORAL
  Filled 2014-09-26: qty 2

## 2014-09-26 MED ORDER — DIPHENHYDRAMINE HCL 25 MG PO CAPS
25.0000 mg | ORAL_CAPSULE | Freq: Once | ORAL | Status: AC
  Administered 2014-09-26: 25 mg via ORAL
  Filled 2014-09-26: qty 1

## 2014-09-26 MED ORDER — SODIUM CHLORIDE 0.9 % IJ SOLN
10.0000 mL | INTRAMUSCULAR | Status: AC | PRN
  Administered 2014-09-26: 10 mL

## 2014-09-26 MED ORDER — SODIUM CHLORIDE 0.9 % IV SOLN
250.0000 mL | Freq: Once | INTRAVENOUS | Status: AC
  Administered 2014-09-26: 250 mL via INTRAVENOUS

## 2014-09-26 NOTE — Patient Instructions (Signed)
[facility] Discharge Instructions  RECOMMENDATIONS MADE BY THE CONSULTANT AND ANY TEST RESULTS WILL BE SENT TO YOUR REFERRING PHYSICIAN. INSTRUCTIONS/FOLLOW-UP: You received 2 units packed red blood cell transfusion today. Return to clinic as scheduled Oct 16, 2014.  Thank you for choosing [facility] to provide your oncology and hematology care.  To afford each patient quality time with our providers, please arrive at least 15 minutes before your scheduled appointment time.  With your help, our goal is to use those 15 minutes to complete the necessary work-up to ensure our physicians have the information they need to help with your evaluation and healthcare recommendations.    Effective January 1st, 2014, we ask that you re-schedule your appointment with our physicians should you arrive 10 or more minutes late for your appointment.  We strive to give you quality time with our providers, and arriving late affects you and other patients whose appointments are after yours.    Again, thank you for choosing [facility].  Our hope is that these requests will decrease the amount of time that you wait before being seen by our physicians.       _____________________________________________________________  Should you have questions after your visit to [facility], please contact our office at [phone] between the hours of 8:30 a.m. and 4:30 p.m.  Voicemails left after 4:30 p.m. will not be returned until the following business day.  For prescription refill requests, have your pharmacy contact our office with your prescription refill request.    _______________________________________________________________  We hope that we have given you very good care.  You may receive a patient satisfaction survey in the mail, please complete it and return it as soon as possible.  We value your  feedback!  _______________________________________________________________  Have you asked about our STAR program?  STAR stands for Survivorship Training and Rehabilitation, and this is a nationally recognized cancer care program that focuses on survivorship and rehabilitation.  Cancer and cancer treatments may cause problems, such as, pain, making you feel tired and keeping you from doing the things that you need or want to do. Cancer rehabilitation can help. Our goal is to reduce these troubling effects and help you have the best quality of life possible.  You may receive a survey from a nurse that asks questions about your current state of health.  Based on the survey results, all eligible patients will be referred to the [facility] program for an evaluation so we can better serve you!  A frequently asked questions sheet is available upon request.

## 2014-09-26 NOTE — Progress Notes (Signed)
Tolerated transfusion without s/s adverse reaction. 

## 2014-09-27 LAB — TYPE AND SCREEN
ABO/RH(D): A POS
ANTIBODY SCREEN: NEGATIVE
UNIT DIVISION: 0
Unit division: 0

## 2014-10-04 ENCOUNTER — Encounter: Payer: Self-pay | Admitting: Gastroenterology

## 2014-10-07 ENCOUNTER — Emergency Department (HOSPITAL_COMMUNITY): Payer: Medicare Other

## 2014-10-07 ENCOUNTER — Encounter (HOSPITAL_COMMUNITY): Payer: Self-pay

## 2014-10-07 ENCOUNTER — Emergency Department (HOSPITAL_COMMUNITY)
Admission: EM | Admit: 2014-10-07 | Discharge: 2014-10-07 | Disposition: A | Discharge status: Home / Self Care | Payer: Medicare Other | Attending: Emergency Medicine | Admitting: Emergency Medicine

## 2014-10-07 DIAGNOSIS — Z8719 Personal history of other diseases of the digestive system: Secondary | ICD-10-CM | POA: Diagnosis not present

## 2014-10-07 DIAGNOSIS — E782 Mixed hyperlipidemia: Secondary | ICD-10-CM | POA: Diagnosis not present

## 2014-10-07 DIAGNOSIS — E1165 Type 2 diabetes mellitus with hyperglycemia: Secondary | ICD-10-CM | POA: Insufficient documentation

## 2014-10-07 DIAGNOSIS — Z8673 Personal history of transient ischemic attack (TIA), and cerebral infarction without residual deficits: Secondary | ICD-10-CM | POA: Insufficient documentation

## 2014-10-07 DIAGNOSIS — N184 Chronic kidney disease, stage 4 (severe): Secondary | ICD-10-CM | POA: Insufficient documentation

## 2014-10-07 DIAGNOSIS — G40909 Epilepsy, unspecified, not intractable, without status epilepticus: Secondary | ICD-10-CM | POA: Diagnosis not present

## 2014-10-07 DIAGNOSIS — E876 Hypokalemia: Secondary | ICD-10-CM | POA: Diagnosis not present

## 2014-10-07 DIAGNOSIS — Z87891 Personal history of nicotine dependence: Secondary | ICD-10-CM | POA: Insufficient documentation

## 2014-10-07 DIAGNOSIS — R0602 Shortness of breath: Secondary | ICD-10-CM

## 2014-10-07 DIAGNOSIS — I129 Hypertensive chronic kidney disease with stage 1 through stage 4 chronic kidney disease, or unspecified chronic kidney disease: Secondary | ICD-10-CM | POA: Diagnosis not present

## 2014-10-07 DIAGNOSIS — F0390 Unspecified dementia without behavioral disturbance: Secondary | ICD-10-CM | POA: Insufficient documentation

## 2014-10-07 DIAGNOSIS — I493 Ventricular premature depolarization: Secondary | ICD-10-CM | POA: Insufficient documentation

## 2014-10-07 DIAGNOSIS — Z79899 Other long term (current) drug therapy: Secondary | ICD-10-CM | POA: Insufficient documentation

## 2014-10-07 DIAGNOSIS — Z951 Presence of aortocoronary bypass graft: Secondary | ICD-10-CM | POA: Insufficient documentation

## 2014-10-07 DIAGNOSIS — I252 Old myocardial infarction: Secondary | ICD-10-CM | POA: Insufficient documentation

## 2014-10-07 DIAGNOSIS — Z794 Long term (current) use of insulin: Secondary | ICD-10-CM | POA: Insufficient documentation

## 2014-10-07 DIAGNOSIS — I509 Heart failure, unspecified: Secondary | ICD-10-CM | POA: Insufficient documentation

## 2014-10-07 DIAGNOSIS — I25811 Atherosclerosis of native coronary artery of transplanted heart without angina pectoris: Secondary | ICD-10-CM | POA: Insufficient documentation

## 2014-10-07 LAB — COMPREHENSIVE METABOLIC PANEL
ALBUMIN: 2.3 g/dL — AB (ref 3.5–5.2)
ALT: 35 U/L (ref 0–35)
AST: 41 U/L — ABNORMAL HIGH (ref 0–37)
Alkaline Phosphatase: 127 U/L — ABNORMAL HIGH (ref 39–117)
Anion gap: 14 (ref 5–15)
BUN: 68 mg/dL — ABNORMAL HIGH (ref 6–23)
CO2: 24 mEq/L (ref 19–32)
CREATININE: 2.92 mg/dL — AB (ref 0.50–1.10)
Calcium: 8.4 mg/dL (ref 8.4–10.5)
Chloride: 102 mEq/L (ref 96–112)
GFR calc Af Amer: 17 mL/min — ABNORMAL LOW (ref >90)
GFR calc non Af Amer: 15 mL/min — ABNORMAL LOW (ref >90)
Glucose, Bld: 199 mg/dL — ABNORMAL HIGH (ref 70–99)
POTASSIUM: 3.4 meq/L — AB (ref 3.7–5.3)
Sodium: 140 mEq/L (ref 137–147)
TOTAL PROTEIN: 8 g/dL (ref 6.0–8.3)
Total Bilirubin: 0.4 mg/dL (ref 0.3–1.2)

## 2014-10-07 LAB — CBC WITH DIFFERENTIAL/PLATELET
BASOS PCT: 0 % (ref 0–1)
Basophils Absolute: 0 10*3/uL (ref 0.0–0.1)
Eosinophils Absolute: 0.1 10*3/uL (ref 0.0–0.7)
Eosinophils Relative: 1 % (ref 0–5)
HCT: 37.7 % (ref 36.0–46.0)
HEMOGLOBIN: 11.6 g/dL — AB (ref 12.0–15.0)
Lymphocytes Relative: 12 % (ref 12–46)
Lymphs Abs: 0.7 10*3/uL (ref 0.7–4.0)
MCH: 24.8 pg — AB (ref 26.0–34.0)
MCHC: 30.8 g/dL (ref 30.0–36.0)
MCV: 80.6 fL (ref 78.0–100.0)
MONO ABS: 0.7 10*3/uL (ref 0.1–1.0)
MONOS PCT: 13 % — AB (ref 3–12)
Neutro Abs: 3.9 10*3/uL (ref 1.7–7.7)
Neutrophils Relative %: 74 % (ref 43–77)
Platelets: 290 10*3/uL (ref 150–400)
RBC: 4.68 MIL/uL (ref 3.87–5.11)
RDW: 20 % — AB (ref 11.5–15.5)
WBC: 5.3 10*3/uL (ref 4.0–10.5)

## 2014-10-07 LAB — TROPONIN I

## 2014-10-07 MED ORDER — TORSEMIDE 100 MG PO TABS
ORAL_TABLET | ORAL | Status: AC
  Filled 2014-10-07: qty 1

## 2014-10-07 MED ORDER — TORSEMIDE 100 MG PO TABS
100.0000 mg | ORAL_TABLET | Freq: Once | ORAL | Status: AC
  Administered 2014-10-07: 100 mg via ORAL
  Filled 2014-10-07: qty 1

## 2014-10-07 MED ORDER — POTASSIUM CHLORIDE CRYS ER 20 MEQ PO TBCR
40.0000 meq | EXTENDED_RELEASE_TABLET | Freq: Two times a day (BID) | ORAL | Status: DC
  Administered 2014-10-07: 40 meq via ORAL
  Filled 2014-10-07: qty 2

## 2014-10-07 NOTE — ED Notes (Signed)
Ambulated to BR w/use of walker.

## 2014-10-07 NOTE — ED Notes (Signed)
Pt c/o of SOB since awakening and BLE edema. Patient uses 2L/Painter intermittently at home.

## 2014-10-07 NOTE — ED Notes (Signed)
Patient with no complaints at this time. Respirations even and unlabored. Skin warm/dry. Discharge instructions reviewed with patient at this time. Patient given opportunity to voice concerns/ask questions. Patient discharged at this time and left Emergency Department with steady gait.   

## 2014-10-07 NOTE — ED Provider Notes (Signed)
CSN: [id number]     Arrival date & time 10/07/14  1555 History   First MD Initiated Contact with Patient 10/07/14 1638     This chart was scribed for Kimberly Dakin, MD by Forrestine Him, ED Scribe. This patient was seen in room APA07/APA07 and the patient's care was started 4:40 PM.   Chief Complaint  Patient presents with  . Shortness of Breath   The history is provided by the patient and a relative. No language interpreter was used.  level V caveat dementia  HPI Comments: [name] is a 73 y.o. female with a PMHx of DM, stroke, CKD, and CHF who presents to the Emergency Department complaining of constant, moderate BLE edema x 2 days that has progressively worsened. Daughter states pt has gained 10 pounds in last 2 weeks. Pt denies any SOB or fever at this time. Daughter states one lower extremity is weaker than other due to previous stroke. No recent changes in Lasix or Demadex medications. Ms. Arenivas walks with a walker at baseline. No known allergies to medications. No treatment prior to coming here. Daughter states that she sounded congested in her chest earlier today. No fever. No other associated symptoms  She is followed by: [name], MD in [facility]  Past Medical History  Diagnosis Date  . Type 2 diabetes mellitus   . Essential hypertension, benign   . History of stroke     Previously on Coumadin  . MI, old     Reported 39  . Seizure disorder   . Coronary atherosclerosis of native coronary artery     Multivessel status post CABG 2002  . History of GI bleed     Erosive gastritis and duodenitis by EGD 2002  . Mixed hyperlipidemia   . Ischemic cardiomyopathy     LVEF 40-45% March 2013  . Dementia   . Stroke   . Seizures   . CKD (chronic kidney disease) stage 4, GFR 15-29 ml/min   . CHF (congestive heart failure)    Past Surgical History  Procedure Laterality Date  . Coronary artery bypass graft  July 2002    LIMA to LAD, SVG to OM1, SVG to OM 2, SVG to RCA   . Tee without cardioversion  01/28/2012    Procedure: TRANSESOPHAGEAL ECHOCARDIOGRAM (TEE);  Surgeon: Lelon Perla, MD;  Location: [facility] ENDOSCOPY;  Service: Cardiovascular;  Laterality: [name];  . Colonoscopy  2002  . Esophagogastroduodenoscopy  2002  . Esophagogastroduodenoscopy [name] 01/18/2014    TWS:FKCLE hiatal hernia. Abnormal gastric and duodenal bulbar mucosa of uncertain significance - status post gastric bx (chronic gastritis/H.pylori +  . Colonoscopy [name] 06/25/2014    Procedure: COLONOSCOPY;  Surgeon: Rogene Houston, MD;  Location: [facility] ENDO SUITE;  Service: Endoscopy;  Laterality: [name];  . Agile capsule [name] 07/06/2014    Procedure: AGILE CAPSULE;  Surgeon: Danie Binder, MD;  Location: [facility] ENDO SUITE;  Service: Endoscopy;  Laterality: [name];  730  . Esophagogastroduodenoscopy [name] 07/20/2014    Procedure: ESOPHAGOGASTRODUODENOSCOPY (EGD);  Surgeon: Daneil Dolin, MD;  Location: [facility] ENDO SUITE;  Service: Endoscopy;  Laterality: [name];  . Givens capsule study [name] 07/20/2014    Procedure: GIVENS CAPSULE STUDY;  Surgeon: Daneil Dolin, MD;  Location: [facility] ENDO SUITE;  Service: Endoscopy;  Laterality: [name];   Family History  Problem Relation Age of Onset  . Stroke Father   . Diabetes type II Mother   . Colon cancer Neg Hx    History  Substance  Use Topics  . Smoking status: Former Smoker -- 56 years  . Smokeless tobacco: Not on file  . Alcohol Use: No   OB History    No data available     Review of Systems  Unable to perform ROS Cardiovascular: Positive for leg swelling.  All other systems reviewed and are negative.     Allergies  Review of patient's allergies indicates no known allergies.  Home Medications   Prior to Admission medications   Medication Sig Start Date End Date Taking? Authorizing Provider  calcitRIOL (ROCALTROL) 0.25 MCG capsule Take 1 capsule (0.25 mcg total) by mouth daily. 05/02/14   Eugenie Filler, MD  donepezil (ARICEPT) 10 MG tablet Take 10 mg by mouth at  bedtime.  01/30/14   Historical Provider, MD  folic acid (FOLVITE) 1 MG tablet Take 1 mg by mouth every morning.     Historical Provider, MD  hydrALAZINE (APRESOLINE) 25 MG tablet Take 75 mg by mouth every 8 (eight) hours.    Historical Provider, MD  insulin detemir (LEVEMIR) 100 UNIT/ML injection Inject 0.08 mLs (8 Units total) into the skin at bedtime. 05/02/14   Eugenie Filler, MD  isosorbide mononitrate (IMDUR) 30 MG 24 hr tablet Take 0.5 mg by mouth every morning.    Historical Provider, MD  levETIRAcetam (KEPPRA) 100 MG/ML solution Take 5 mLs by mouth 2 (two) times daily. 01/13/14   Historical Provider, MD  linagliptin (TRADJENTA) 5 MG TABS tablet Take 5 mg by mouth every morning.     Historical Provider, MD  metoprolol succinate (TOPROL-XL) 100 MG 24 hr tablet Take 50 mg by mouth every morning. Take with or immediately following a meal.    Historical Provider, MD  pantoprazole (PROTONIX) 40 MG tablet Take 40 mg by mouth every morning.    Historical Provider, MD  potassium chloride (MICRO-K) 10 MEQ CR capsule Take 10 mEq by mouth See admin instructions. Alternate taking one Potassium daily with taking one Potassium twice daily with Torsemide as directed    Historical Provider, MD  pravastatin (PRAVACHOL) 20 MG tablet Take 20 mg by mouth at bedtime.     Historical Provider, MD  sodium bicarbonate 650 MG tablet Take 650 mg by mouth 3 (three) times daily.    Historical Provider, MD  torsemide (DEMADEX) 100 MG tablet Take 0.5 tablets (50 mg total) by mouth daily. 05/02/14   Eugenie Filler, MD  Vitamin D, Ergocalciferol, (DRISDOL) 50000 UNITS CAPS Take 50,000 Units by mouth every 7 (seven) days. Takes on Tuesday    Historical Provider, MD   Triage Vitals: BP 153/97 mmHg  Pulse 64  Temp(Src) 97.7 F (36.5 C) (Oral)  Resp 16  Ht 5\' 4"  (1.626 m)  Wt 130 lb (58.968 kg)  BMI 22.30 kg/m2  SpO2 98%   Physical Exam  Constitutional: She appears well-developed and well-nourished. No distress.  HENT:   Head: Normocephalic and atraumatic.  Eyes: Conjunctivae are normal. Pupils are equal, round, and reactive to light.  Neck: Neck supple. JVD present. No tracheal deviation present. No thyromegaly present.  Cardiovascular: Normal rate and regular rhythm.   No murmur heard. Pulmonary/Chest: Effort normal and breath sounds normal.  Abdominal: Soft. Bowel sounds are normal. She exhibits no distension. There is no tenderness.  Musculoskeletal: Normal range of motion. She exhibits edema. She exhibits no tenderness.  2+ pitting pretibial edema bilaterally  Neurological: She is alert.  Skin: Skin is warm and dry. No rash noted. She is not diaphoretic.  Psychiatric:  She has a normal mood and affect.  Nursing note and vitals reviewed.   ED Course  Procedures (including critical care time)  DIAGNOSTIC STUDIES: Oxygen Saturation is 98% on RA, Normal by my interpretation.    COORDINATION OF CARE: 4:40 PM- Will order DG chest 2 view, CBC, CMP, and troponin I. Discussed treatment plan with pt at bedside and pt agreed to plan.     Labs Review Labs Reviewed - No data to display  Imaging Review No results found.   EKG Interpretation   Date/Time:  Saturday October 07 2014 16:39:56 EST Ventricular Rate:  83 PR Interval:  135 QRS Duration: 88 QT Interval:  432 QTC Calculation: 508 R Axis:   46 Text Interpretation:  Age not entered, assumed to be  73 years old for  purpose of ECG interpretation Sinus or ectopic atrial rhythm Multiple  premature complexes, vent  Consider left ventricular hypertrophy  Nonspecific T abnormalities, diffuse leads Premature ventricular complexes  New since previous tracing Confirmed by Winfred Leeds  MD, Adien Kimmel [phone]) on  10/07/2014 4:51:26 PM     7:40 PM patient continues to deny shortness of breath. She is resting comfortably. Speaks in paragraphs. Chest x-ray viewed by me Results for orders placed or performed during the hospital encounter of 10/07/14  CBC  with Differential  Result Value Ref Range   WBC 5.3 4.0 - 10.5 K/uL   RBC 4.68 3.87 - 5.11 MIL/uL   Hemoglobin 11.6 (L) 12.0 - 15.0 g/dL   HCT 37.7 36.0 - 46.0 %   MCV 80.6 78.0 - 100.0 fL   MCH 24.8 (L) 26.0 - 34.0 pg   MCHC 30.8 30.0 - 36.0 g/dL   RDW 20.0 (H) 11.5 - 15.5 %   Platelets 290 150 - 400 K/uL   Neutrophils Relative % 74 43 - 77 %   Neutro Abs 3.9 1.7 - 7.7 K/uL   Lymphocytes Relative 12 12 - 46 %   Lymphs Abs 0.7 0.7 - 4.0 K/uL   Monocytes Relative 13 (H) 3 - 12 %   Monocytes Absolute 0.7 0.1 - 1.0 K/uL   Eosinophils Relative 1 0 - 5 %   Eosinophils Absolute 0.1 0.0 - 0.7 K/uL   Basophils Relative 0 0 - 1 %   Basophils Absolute 0.0 0.0 - 0.1 K/uL  Comprehensive metabolic panel  Result Value Ref Range   Sodium 140 137 - 147 mEq/L   Potassium 3.4 (L) 3.7 - 5.3 mEq/L   Chloride 102 96 - 112 mEq/L   CO2 24 19 - 32 mEq/L   Glucose, Bld 199 (H) 70 - 99 mg/dL   BUN 68 (H) 6 - 23 mg/dL   Creatinine, Ser 2.92 (H) 0.50 - 1.10 mg/dL   Calcium 8.4 8.4 - 10.5 mg/dL   Total Protein 8.0 6.0 - 8.3 g/dL   Albumin 2.3 (L) 3.5 - 5.2 g/dL   AST 41 (H) 0 - 37 U/L   ALT 35 0 - 35 U/L   Alkaline Phosphatase 127 (H) 39 - 117 U/L   Total Bilirubin 0.4 0.3 - 1.2 mg/dL   GFR calc non Af Amer 15 (L) >90 mL/min   GFR calc Af Amer 17 (L) >90 mL/min   Anion gap 14 5 - 15  Troponin I  Result Value Ref Range   Troponin I <0.30 <0.30 ng/mL   Dg Chest 2 View  10/07/2014   CLINICAL DATA:  Shortness of breath.  EXAM: CHEST  2 VIEW  COMPARISON:  06/09/2014  and prior radiograph  FINDINGS: Cardiomegaly, pulmonary vascular congestion and question of mild interstitial edema noted.  Cardiac surgical changes again noted.  Elevation of the right hemidiaphragm is present.  There is no evidence of pleural effusion or pneumothorax.  IMPRESSION: Cardiomegaly with pulmonary vascular congestion and question mild interstitial edema.   Electronically Signed   By: Hassan Rowan M.D.   On: 10/07/2014 18:24    MDM   Plan torsemide 1 dose100 mg poprior to discharge as well as potassium chloride40 mEq by mouth prior to discharge Final diagnoses:  None  clinically patient and very mild congestive heart failure.resume current medication regimen Suggests follow-up with Dr. Berdine Addison nextto recheck serum potassium, and kidney function  Diagnosis #1 congestive heart failure #2 hypokalemia #3 chronic renal insufficiency #4 hyperglycemia #6 pvcs I personally performed the services described in this documentation, which was scribed in my presence. The recorded information has been reviewed and is accurate.    Kimberly Dakin, MD 10/07/14 [phone]

## 2014-10-07 NOTE — Discharge Instructions (Signed)
Heart Failure  Today [name]'s serum creatinine was 2.92,slightly worse than a month ago. Blood potassium level was 3.4,slightly low and blood sugar was 199 which is slightly elevated. Call Dr. Berdine Addison on Monday, 10/09/2014 to arrange to be seen in his office next week. Ask him to compare these levels to most recent values that he has. He may need to recheck blood work on her. Please take these instructions with you to his office. Return if her condition worsens for any reason prior to seeing Dr. Berdine Addison Heart failure is a condition in which the heart has trouble pumping blood. This means your heart does not pump blood efficiently for your body to work well. In some cases of heart failure, fluid may back up into your lungs or you may have swelling (edema) in your lower legs. Heart failure is usually a long-term (chronic) condition. It is important for you to take good care of yourself and follow your health care provider's treatment plan. CAUSES  Some health conditions can cause heart failure. Those health conditions include:  High blood pressure (hypertension). Hypertension causes the heart muscle to work harder than normal. When pressure in the blood vessels is high, the heart needs to pump (contract) with more force in order to circulate blood throughout the body. High blood pressure eventually causes the heart to become stiff and weak.  Coronary artery disease (CAD). CAD is the buildup of cholesterol and fat (plaque) in the arteries of the heart. The blockage in the arteries deprives the heart muscle of oxygen and blood. This can cause chest pain and may lead to a heart attack. High blood pressure can also contribute to CAD.  Heart attack (myocardial infarction). A heart attack occurs when one or more arteries in the heart become blocked. The loss of oxygen damages the muscle tissue of the heart. When this happens, part of the heart muscle dies. The injured tissue does not contract as well and weakens the  heart's ability to pump blood.  Abnormal heart valves. When the heart valves do not open and close properly, it can cause heart failure. This makes the heart muscle pump harder to keep the blood flowing.  Heart muscle disease (cardiomyopathy or myocarditis). Heart muscle disease is damage to the heart muscle from a variety of causes. These can include drug or alcohol abuse, infections, or unknown reasons. These can increase the risk of heart failure.  Lung disease. Lung disease makes the heart work harder because the lungs do not work properly. This can cause a strain on the heart, leading it to fail.  Diabetes. Diabetes increases the risk of heart failure. High blood sugar contributes to high fat (lipid) levels in the blood. Diabetes can also cause slow damage to tiny blood vessels that carry important nutrients to the heart muscle. When the heart does not get enough oxygen and food, it can cause the heart to become weak and stiff. This leads to a heart that does not contract efficiently.  Other conditions can contribute to heart failure. These include abnormal heart rhythms, thyroid problems, and low blood counts (anemia). Certain unhealthy behaviors can increase the risk of heart failure, including:  Being overweight.  Smoking or chewing tobacco.  Eating foods high in fat and cholesterol.  Abusing illicit drugs or alcohol.  Lacking physical activity. SYMPTOMS  Heart failure symptoms may vary and can be hard to detect. Symptoms may include:  Shortness of breath with activity, such as climbing stairs.  Persistent cough.  Swelling of the  feet, ankles, legs, or abdomen.  Unexplained weight gain.  Difficulty breathing when lying flat (orthopnea).  Waking from sleep because of the need to sit up and get more air.  Rapid heartbeat.  Fatigue and loss of energy.  Feeling light-headed, dizzy, or close to fainting.  Loss of appetite.  Nausea.  Increased urination during the  night (nocturia). DIAGNOSIS  A diagnosis of heart failure is based on your history, symptoms, physical examination, and diagnostic tests. Diagnostic tests for heart failure may include:  Echocardiography.  Electrocardiography.  Chest X-ray.  Blood tests.  Exercise stress test.  Cardiac angiography.  Radionuclide scans. TREATMENT  Treatment is aimed at managing the symptoms of heart failure. Medicines, behavioral changes, or surgical intervention may be necessary to treat heart failure.  Medicines to help treat heart failure may include:  Angiotensin-converting enzyme (ACE) inhibitors. This type of medicine blocks the effects of a blood protein called angiotensin-converting enzyme. ACE inhibitors relax (dilate) the blood vessels and help lower blood pressure.  Angiotensin receptor blockers (ARBs). This type of medicine blocks the actions of a blood protein called angiotensin. Angiotensin receptor blockers dilate the blood vessels and help lower blood pressure.  Water pills (diuretics). Diuretics cause the kidneys to remove salt and water from the blood. The extra fluid is removed through urination. This loss of extra fluid lowers the volume of blood the heart pumps.  Beta blockers. These prevent the heart from beating too fast and improve heart muscle strength.  Digitalis. This increases the force of the heartbeat.  Healthy behavior changes include:  Obtaining and maintaining a healthy weight.  Stopping smoking or chewing tobacco.  Eating heart-healthy foods.  Limiting or avoiding alcohol.  Stopping illicit drug use.  Physical activity as directed by your health care provider.  Surgical treatment for heart failure may include:  A procedure to open blocked arteries, repair damaged heart valves, or remove damaged heart muscle tissue.  A pacemaker to improve heart muscle function and control certain abnormal heart rhythms.  An internal cardioverter defibrillator to  treat certain serious abnormal heart rhythms.  A left ventricular assist device (LVAD) to assist the pumping ability of the heart. HOME CARE INSTRUCTIONS   Take medicines only as directed by your health care provider. Medicines are important in reducing the workload of your heart, slowing the progression of heart failure, and improving your symptoms.  Do not stop taking your medicine unless directed by your health care provider.  Do not skip any dose of medicine.  Refill your prescriptions before you run out of medicine. Your medicines are needed every day.  Engage in moderate physical activity if directed by your health care provider. Moderate physical activity can benefit some people. The elderly and people with severe heart failure should consult with a health care provider for physical activity recommendations.  Eat heart-healthy foods. Food choices should be free of trans fat and low in saturated fat, cholesterol, and salt (sodium). Healthy choices include fresh or frozen fruits and vegetables, fish, lean meats, legumes, fat-free or low-fat dairy products, and whole grain or high fiber foods. Talk to a dietitian to learn more about heart-healthy foods.  Limit sodium if directed by your health care provider. Sodium restriction may reduce symptoms of heart failure in some people. Talk to a dietitian to learn more about heart-healthy seasonings.  Use healthy cooking methods. Healthy cooking methods include roasting, grilling, broiling, baking, poaching, steaming, or stir-frying. Talk to a dietitian to learn more about healthy cooking methods.  Limit fluids if directed by your health care provider. Fluid restriction may reduce symptoms of heart failure in some people.  Weigh yourself every day. Daily weights are important in the early recognition of excess fluid. You should weigh yourself every morning after you urinate and before you eat breakfast. Wear the same amount of clothing each time  you weigh yourself. Record your daily weight. Provide your health care provider with your weight record.  Monitor and record your blood pressure if directed by your health care provider.  Check your pulse if directed by your health care provider.  Lose weight if directed by your health care provider. Weight loss may reduce symptoms of heart failure in some people.  Stop smoking or chewing tobacco. Nicotine makes your heart work harder by causing your blood vessels to constrict. Do not use nicotine gum or patches before talking to your health care provider.  Keep all follow-up visits as directed by your health care provider. This is important.  Limit alcohol intake to no more than 1 drink per day for nonpregnant women and 2 drinks per day for men. One drink equals 12 ounces of beer, 5 ounces of wine, or 1 ounces of hard liquor. Drinking more than that is harmful to your heart. Tell your health care provider if you drink alcohol several times a week. Talk with your health care provider about whether alcohol is safe for you. If your heart has already been damaged by alcohol or you have severe heart failure, drinking alcohol should be stopped completely.  Stop illicit drug use.  Stay up-to-date with immunizations. It is especially important to prevent respiratory infections through current pneumococcal and influenza immunizations.  Manage other health conditions such as hypertension, diabetes, thyroid disease, or abnormal heart rhythms as directed by your health care provider.  Learn to manage stress.  Plan rest periods when fatigued.  Learn strategies to manage high temperatures. If the weather is extremely hot:  Avoid vigorous physical activity.  Use air conditioning or fans or seek a cooler location.  Avoid caffeine and alcohol.  Wear loose-fitting, lightweight, and light-colored clothing.  Learn strategies to manage cold temperatures. If the weather is extremely cold:  Avoid  vigorous physical activity.  Layer clothes.  Wear mittens or gloves, a hat, and a scarf when going outside.  Avoid alcohol.  Obtain ongoing education and support as needed.  Participate in or seek rehabilitation as needed to maintain or improve independence and quality of life. SEEK MEDICAL CARE IF:   Your weight increases by 03 lb/1.4 kg in 1 day or 05 lb/2.3 kg in a week.  You have increasing shortness of breath that is unusual for you.  You are unable to participate in your usual physical activities.  You tire easily.  You cough more than normal, especially with physical activity.  You have any or more swelling in areas such as your hands, feet, ankles, or abdomen.  You are unable to sleep because it is hard to breathe.  You feel like your heart is beating fast (palpitations).  You become dizzy or light-headed upon standing up. SEEK IMMEDIATE MEDICAL CARE IF:   You have difficulty breathing.  There is a change in mental status such as decreased alertness or difficulty with concentration.  You have a pain or discomfort in your chest.  You have an episode of fainting (syncope). MAKE SURE YOU:   Understand these instructions.  Will watch your condition.  Will get help right away if you are  not doing well or get worse. Document Released: 11/10/2005 Document Revised: 03/27/2014 Document Reviewed: 12/10/2012 [facility] Patient Information 2015 [place], [place]. This information is not intended to replace advice given to you by your health care provider. Make sure you discuss any questions you have with your health care provider.

## 2014-10-16 ENCOUNTER — Encounter (HOSPITAL_COMMUNITY): Payer: Medicare Other | Attending: Hematology and Oncology

## 2014-10-16 ENCOUNTER — Encounter (HOSPITAL_COMMUNITY): Payer: Medicare Other

## 2014-10-16 ENCOUNTER — Encounter (HOSPITAL_BASED_OUTPATIENT_CLINIC_OR_DEPARTMENT_OTHER): Payer: Medicare Other

## 2014-10-16 ENCOUNTER — Telehealth: Payer: Self-pay | Admitting: Gastroenterology

## 2014-10-16 ENCOUNTER — Encounter (HOSPITAL_COMMUNITY): Payer: Self-pay

## 2014-10-16 VITALS — BP 146/64 | HR 62 | Temp 97.8°F | Resp 22 | Wt 154.2 lb

## 2014-10-16 DIAGNOSIS — R0602 Shortness of breath: Secondary | ICD-10-CM

## 2014-10-16 DIAGNOSIS — E119 Type 2 diabetes mellitus without complications: Secondary | ICD-10-CM | POA: Diagnosis not present

## 2014-10-16 DIAGNOSIS — N184 Chronic kidney disease, stage 4 (severe): Secondary | ICD-10-CM

## 2014-10-16 DIAGNOSIS — N189 Chronic kidney disease, unspecified: Secondary | ICD-10-CM | POA: Diagnosis not present

## 2014-10-16 DIAGNOSIS — D631 Anemia in chronic kidney disease: Secondary | ICD-10-CM | POA: Diagnosis not present

## 2014-10-16 LAB — CBC WITH DIFFERENTIAL/PLATELET
BASOS PCT: 0 % (ref 0–1)
Basophils Absolute: 0 10*3/uL (ref 0.0–0.1)
EOS ABS: 0 10*3/uL (ref 0.0–0.7)
EOS PCT: 0 % (ref 0–5)
HCT: 33.6 % — ABNORMAL LOW (ref 36.0–46.0)
HEMOGLOBIN: 10.4 g/dL — AB (ref 12.0–15.0)
LYMPHS PCT: 15 % (ref 12–46)
Lymphs Abs: 0.9 10*3/uL (ref 0.7–4.0)
MCH: 24.4 pg — ABNORMAL LOW (ref 26.0–34.0)
MCHC: 31 g/dL (ref 30.0–36.0)
MCV: 78.9 fL (ref 78.0–100.0)
Monocytes Absolute: 0.7 10*3/uL (ref 0.1–1.0)
Monocytes Relative: 13 % — ABNORMAL HIGH (ref 3–12)
NEUTROS PCT: 72 % (ref 43–77)
Neutro Abs: 4.1 10*3/uL (ref 1.7–7.7)
Platelets: 242 10*3/uL (ref 150–400)
RBC: 4.26 MIL/uL (ref 3.87–5.11)
RDW: 20.4 % — ABNORMAL HIGH (ref 11.5–15.5)
WBC: 5.7 10*3/uL (ref 4.0–10.5)

## 2014-10-16 MED ORDER — DARBEPOETIN ALFA 500 MCG/ML IJ SOSY
500.0000 ug | PREFILLED_SYRINGE | Freq: Once | INTRAMUSCULAR | Status: AC
  Administered 2014-10-16: 500 ug via SUBCUTANEOUS
  Filled 2014-10-16: qty 1

## 2014-10-16 NOTE — Progress Notes (Signed)
See office visit for today.

## 2014-10-16 NOTE — Progress Notes (Signed)
[facility]  OFFICE PROGRESS NOTE  [name], MD [address] [place] [address]  DIAGNOSIS: Anemia in chronic kidney disease - Plan: CBC with Differential  Anemia in stage 4 chronic kidney disease - Plan: ARANESP TREATMENT CONDITION, SCHEDULING COMMUNICATION INJECTION, SCHEDULING COMMUNICATION LAB, Darbepoetin Alfa (ARANESP) injection 500 mcg  Chief Complaint  Patient presents with  . Anemia    CURRENT THERAPY: Aranesp 500 g subcutaneously every 3 weeks for anemia in chronic kidney disease, stage IV.  INTERVAL HISTORY: [name] 73 y.o. female returns for follow-up while receiving Aranesp subcutaneously every 3 weeks for anemia in chronic kidney disease, stage IV with last hemoglobin on 10/07/2014 reported as 11.6. She had received Aranesp 500 g on 09/25/2014 and is here today for continuation of treatment if hemoglobin is less than 11.  She has noticed some increased shortness of breath but denies PND, orthopnea, or palpitations. Lower extremities are not swollen. She denies any cough, wheezing, abdominal pain, melena, hematochezia, hematuria, epistaxis, or hemoptysis.  MEDICAL HISTORY: Past Medical History  Diagnosis Date  . Type 2 diabetes mellitus   . Essential hypertension, benign   . History of stroke     Previously on Coumadin  . MI, old     Reported 65  . Seizure disorder   . Coronary atherosclerosis of native coronary artery     Multivessel status post CABG 2002  . History of GI bleed     Erosive gastritis and duodenitis by EGD 2002  . Mixed hyperlipidemia   . Ischemic cardiomyopathy     LVEF 40-45% March 2013  . Dementia   . Stroke   . Seizures   . CKD (chronic kidney disease) stage 4, GFR 15-29 ml/min   . CHF (congestive heart failure)     INTERIM HISTORY: has Acute CVA Left frontal lobe,; Hypertension; Diabetes mellitus; Seizure disorder; ARF (acute renal failure); Neutropenia; CVA (cerebral  infarction); Hyperglycemia; CKD (chronic kidney disease) stage 3, GFR 30-59 ml/min; Diabetes; Dehydration; Bilateral lower extremity edema; GI bleeding; Weakness generalized; Anemia associated with acute blood loss; NSTEMI (non-ST elevated myocardial infarction); Systolic CHF, chronic last EF 40% 3/13; Iron deficiency anemia; Diarrhea; Edema of right lower extremity; CHF, acute on chronic; Dementia; CHF (congestive heart failure); Severe mitral regurgitation; Acute on chronic combined systolic and diastolic congestive heart failure; Chronic kidney disease (CKD), stage IV (severe); CAP (community acquired pneumonia); Hypoglycemia; Choking episode; Severe anemia; Cardiomyopathy, ischemic; Acute renal failure superimposed on stage 4 chronic kidney disease; and Anemia in stage 4 chronic kidney disease on her problem list.    ALLERGIES:  has No Known Allergies.  MEDICATIONS: has a current medication list which includes the following prescription(s): calcitriol, donepezil, folic acid, hydralazine, insulin detemir, isosorbide mononitrate, levetiracetam, linagliptin, metoprolol succinate, pantoprazole, potassium chloride, potassium chloride, pravastatin, sodium bicarbonate, torsemide, and vitamin d (ergocalciferol).  SURGICAL HISTORY:  Past Surgical History  Procedure Laterality Date  . Coronary artery bypass graft  July 2002    LIMA to LAD, SVG to OM1, SVG to OM 2, SVG to RCA  . Tee without cardioversion  01/28/2012    Procedure: TRANSESOPHAGEAL ECHOCARDIOGRAM (TEE);  Surgeon: Lelon Perla, MD;  Location: [facility] ENDOSCOPY;  Service: Cardiovascular;  Laterality: [name];  . Colonoscopy  2002  . Esophagogastroduodenoscopy  2002  . Esophagogastroduodenoscopy [name] 01/18/2014    IRW:ERXVQ hiatal hernia. Abnormal gastric and duodenal bulbar mucosa of uncertain significance - status post gastric bx (chronic  gastritis/H.pylori +  . Colonoscopy [name] 06/25/2014    Procedure: COLONOSCOPY;  Surgeon: Rogene Houston, MD;   Location: [facility] ENDO SU[facility];  Service: Endoscopy;  Laterality: [name];  . Agile capsule [name] 07/06/2014    Procedure: AGILE CAPSULE;  Surgeon: Danie Binder, MD;  Location: [facility] ENDO SUITE;  Service: Endoscopy;  Laterality: [name];  730  . Esophagogastroduodenoscopy [name] 07/20/2014    Procedure: ESOPHAGOGASTRODUODENOSCOPY (EGD);  Surgeon: Daneil Dolin, MD;  Location: [facility] ENDO [facility];  Service: Endoscopy;  Laterality: [name];  . Givens capsule study [name] 07/20/2014    Procedure: GIVENS CAPSULE STUDY;  Surgeon: Daneil Dolin, MD;  Location: [facility] END[facility];  Service: Endoscopy;  Laterality: [name];    FAMILY HISTORY: family history includes Diabetes type II in her mother; Stroke in her father. There is no history of Colon cancer.  SOCIAL HISTORY:  reports that she has quit smoking. She does not have any smokeless tobacco history on file. She reports that she does not drink alcohol or use illicit drugs.  REVIEW OF SYSTEMS:  Other than that discussed above is noncontributory.  PHYSICAL EXAMINATION: ECOG PERFORMANCE STATUS: 1 - Symptomatic but completely ambulatory  Blood pressure 146/64, pulse 62, temperature 97.8 F (36.6 C), resp. rate 22, weight 154 lb 3.2 oz (69.945 kg), SpO2 100 %.  GENERAL:alert, no distress and comfortable SKIN: skin color, texture, turgor are normal, no rashes or significant lesions EYES: PERLA; Conjunctiva are pink and non-injected, sclera clear SINUSES: No redness or tenderness over maxillary or ethmoid sinuses OROPHARYNX:no exudate, no erythema on lips, buccal mucosa, or tongue. NECK: supple, thyroid normal size, non-tender, without nodularity. No masses CHEST: Normal AP diameter with no breast masses. LYMPH:  no palpable lymphadenopathy in the cervical, axillary or inguinal LUNGS: clear to auscultation and percussion with normal breathing effort HEART: regular rate & rhythm and no murmurs. ABDOMEN:abdomen soft, non-tender and normal bowel sounds MUSCULOSKELETAL:no cyanosis of digits  and no clubbing. Range of motion normal.  NEURO: alert & oriented x 3 with fluent speech, no focal motor/sensory deficits   LABORATORY DATA:  Transfuse    Aranesp given on 09/25/2014  Transfusion 2 units of packed red blood cells on 09/25/2014  Results for EUFELIA, VENO (MRN [id number]) as of 10/16/2014 14:21  Ref. Range 09/25/2014 12:27 10/07/2014 17:39  Hemoglobin Latest Range: 12.0-15.0 g/dL 7.5 (L) 11.6 (L)     Lab on 10/16/2014  Component Date Value Ref Range Status  . WBC 10/16/2014 5.7  4.0 - 10.5 K/uL Final  . RBC 10/16/2014 4.26  3.87 - 5.11 MIL/uL Final  . Hemoglobin 10/16/2014 10.4* 12.0 - 15.0 g/dL Final  . HCT 10/16/2014 33.6* 36.0 - 46.0 % Final  . MCV 10/16/2014 78.9  78.0 - 100.0 fL Final  . MCH 10/16/2014 24.4* 26.0 - 34.0 pg Final  . MCHC 10/16/2014 31.0  30.0 - 36.0 g/dL Final  . RDW 10/16/2014 20.4* 11.5 - 15.5 % Final  . Platelets 10/16/2014 242  150 - 400 K/uL Final  . Neutrophils Relative % 10/16/2014 72  43 - 77 % Final  . Lymphocytes Relative 10/16/2014 15  12 - 46 % Final  . Monocytes Relative 10/16/2014 13* 3 - 12 % Final  . Eosinophils Relative 10/16/2014 0  0 - 5 % Final  . Basophils Relative 10/16/2014 0  0 - 1 % Final  . Neutro Abs 10/16/2014 4.1  1.7 - 7.7 K/uL Final  . Lymphs Abs 10/16/2014 0.9  0.7 - 4.0 K/uL Final  .  Monocytes Absolute 10/16/2014 0.7  0.1 - 1.0 K/uL Final  . Eosinophils Absolute 10/16/2014 0.0  0.0 - 0.7 K/uL Final  . Basophils Absolute 10/16/2014 0.0  0.0 - 0.1 K/uL Final  . RBC Morphology 10/16/2014 POLYCHROMASIA PRESENT   Final   [place]  . Smear Review 10/16/2014 PLATELET COUNT CONFIRMED BY SMEAR   Final   Comment: [place] PLATELETS SEEN   Admission on 10/07/2014, Discharged on 10/07/2014  Component Date Value Ref Range Status  . WBC 10/07/2014 5.3  4.0 - 10.5 K/uL Final  . RBC 10/07/2014 4.68  3.87 - 5.11 MIL/uL Final  . Hemoglobin 10/07/2014 11.6* 12.0 - 15.0 g/dL Final  . HCT 10/07/2014  37.7  36.0 - 46.0 % Final  . MCV 10/07/2014 80.6  78.0 - 100.0 fL Final  . MCH 10/07/2014 24.8* 26.0 - 34.0 pg Final  . MCHC 10/07/2014 30.8  30.0 - 36.0 g/dL Final  . RDW 10/07/2014 20.0* 11.5 - 15.5 % Final  . Platelets 10/07/2014 290  150 - 400 K/uL Final  . Neutrophils Relative % 10/07/2014 74  43 - 77 % Final  . Neutro Abs 10/07/2014 3.9  1.7 - 7.7 K/uL Final  . Lymphocytes Relative 10/07/2014 12  12 - 46 % Final  . Lymphs Abs 10/07/2014 0.7  0.7 - 4.0 K/uL Final  . Monocytes Relative 10/07/2014 13* 3 - 12 % Final  . Monocytes Absolute 10/07/2014 0.7  0.1 - 1.0 K/uL Final  . Eosinophils Relative 10/07/2014 1  0 - 5 % Final  . Eosinophils Absolute 10/07/2014 0.1  0.0 - 0.7 K/uL Final  . Basophils Relative 10/07/2014 0  0 - 1 % Final  . Basophils Absolute 10/07/2014 0.0  0.0 - 0.1 K/uL Final  . Sodium 10/07/2014 140  137 - 147 mEq/L Final  . Potassium 10/07/2014 3.4* 3.7 - 5.3 mEq/L Final  . Chloride 10/07/2014 102  96 - 112 mEq/L Final  . CO2 10/07/2014 24  19 - 32 mEq/L Final  . Glucose, Bld 10/07/2014 199* 70 - 99 mg/dL Final  . BUN 10/07/2014 68* 6 - 23 mg/dL Final  . Creatinine, Ser 10/07/2014 2.92* 0.50 - 1.10 mg/dL Final  . Calcium 10/07/2014 8.4  8.4 - 10.5 mg/dL Final  . Total Protein 10/07/2014 8.0  6.0 - 8.3 g/dL Final  . Albumin 10/07/2014 2.3* 3.5 - 5.2 g/dL Final  . AST 10/07/2014 41* 0 - 37 U/L Final  . ALT 10/07/2014 35  0 - 35 U/L Final  . Alkaline Phosphatase 10/07/2014 127* 39 - 117 U/L Final  . Total Bilirubin 10/07/2014 0.4  0.3 - 1.2 mg/dL Final  . GFR calc non Af Amer 10/07/2014 15* >90 mL/min Final  . GFR calc Af Amer 10/07/2014 17* >90 mL/min Final   Comment: (NOTE) The eGFR has been calculated using the CKD EPI equation. This calculation has not been validated in all clinical situations. eGFR's persistently <90 mL/min signify possible Chronic Kidney Disease.   . Anion gap 10/07/2014 14  5 - 15 Final  . Troponin I 10/07/2014 <0.30  <0.30 ng/mL Final     Comment:        Due to the release kinetics of cTnI, a negative result within the first hours of the onset of symptoms does not rule out myocardial infarction with certainty. If myocardial infarction is still suspected, repeat the test at appropriate intervals.   Infusion on 09/25/2014  Component Date Value Ref Range Status  . Order Confirmation 09/25/2014 ORDER  PROCESSED BY BLOOD BANK   Final  . ABO/RH(D) 09/25/2014 A POS   Final  . Antibody Screen 09/25/2014 NEG   Final  . Sample Expiration 09/25/2014 09/28/2014   Final  . Unit Number 09/25/2014 [id number]   Final  . Blood Component Type 09/25/2014 RED CELLS,LR   Final  . Unit division 09/25/2014 00   Final  . Status of Unit 09/25/2014 ISSUED,FINAL   Final  . Transfusion Status 09/25/2014 OK TO TRANSFUSE   Final  . Crossmatch Result 09/25/2014 Compatible   Final  . Unit Number 09/25/2014 [id number]   Final  . Blood Component Type 09/25/2014 RED CELLS,LR   Final  . Unit division 09/25/2014 00   Final  . Status of Unit 09/25/2014 ISSUED,FINAL   Final  . Transfusion Status 09/25/2014 OK TO TRANSFUSE   Final  . Crossmatch Result 09/25/2014 Compatible   Final  Lab on 09/25/2014  Component Date Value Ref Range Status  . WBC 09/25/2014 6.2  4.0 - 10.5 K/uL Final  . RBC 09/25/2014 3.11* 3.87 - 5.11 MIL/uL Final  . Hemoglobin 09/25/2014 7.5* 12.0 - 15.0 g/dL Final  . HCT 09/25/2014 25.0* 36.0 - 46.0 % Final  . MCV 09/25/2014 80.4  78.0 - 100.0 fL Final  . MCH 09/25/2014 24.1* 26.0 - 34.0 pg Final  . MCHC 09/25/2014 30.0  30.0 - 36.0 g/dL Final  . RDW 09/25/2014 20.3* 11.5 - 15.5 % Final  . Platelets 09/25/2014 357  150 - 400 K/uL Final  . Neutrophils Relative % 09/25/2014 79* 43 - 77 % Final  . Lymphocytes Relative 09/25/2014 13  12 - 46 % Final  . Monocytes Relative 09/25/2014 7  3 - 12 % Final  . Eosinophils Relative 09/25/2014 1  0 - 5 % Final  . Basophils Relative 09/25/2014 0  0 - 1 % Final  . Neutro Abs 09/25/2014  4.9  1.7 - 7.7 K/uL Final  . Lymphs Abs 09/25/2014 0.8  0.7 - 4.0 K/uL Final  . Monocytes Absolute 09/25/2014 0.4  0.1 - 1.0 K/uL Final  . Eosinophils Absolute 09/25/2014 0.1  0.0 - 0.7 K/uL Final  . Basophils Absolute 09/25/2014 0.0  0.0 - 0.1 K/uL Final    PATHOLOGY: No new pathology.  Urinalysis    Component Value Date/Time   COLORURINE YELLOW 06/09/2014 1439   APPEARANCEUR CLEAR 06/09/2014 1439   LABSPEC 1.015 06/09/2014 1439   PHURINE 5.5 06/09/2014 1439   GLUCOSEU NEGATIVE 06/09/2014 1439   HGBUR NEGATIVE 06/09/2014 1439   BILIRUBINUR NEGATIVE 06/09/2014 1439   KETONESUR NEGATIVE 06/09/2014 1439   PROTEINUR 30* 06/09/2014 1439   UROBILINOGEN 0.2 06/09/2014 1439   NITRITE NEGATIVE 06/09/2014 1439   LEUKOCYTESUR NEGATIVE 06/09/2014 1439    RADIOGRAPHIC STUDIES: Dg Chest 2 View  10/07/2014   CLINICAL DATA:  Shortness of breath.  EXAM: CHEST  2 VIEW  COMPARISON:  06/09/2014 and prior radiograph  FINDINGS: Cardiomegaly, pulmonary vascular congestion and question of mild interstitial edema noted.  Cardiac surgical changes again noted.  Elevation of the right hemidiaphragm is present.  There is no evidence of pleural effusion or pneumothorax.  IMPRESSION: Cardiomegaly with pulmonary vascular congestion and question mild interstitial edema.   Electronically Signed   By: Hassan Rowan M.D.   On: 10/07/2014 18:24    ASSESSMENT:  #1. Anemia in chronic renal disease plus chronic blood loss with replenishment of iron stores to do receiving 7 units of red cell transfusions. #2. Chronic kidney disease, stage IV. #3. Diabetes mellitus,  type II, insulin requiring. #4. Hypertension, controlled. #5. History of H. pylori gastritis in February 2015, treated appropriately, maintained on long-term proton pump inhibitor therapy.  PLAN:  #1. Possible enteroscopy at [facility]. #2. Aranesp 500 g subcutaneously  and repeated every 3 weeks if hemoglobin is less than 11.. #3. Follow-up in 9  weeks with CBC ferritin.   All questions were answered. The patient knows to call the clinic with any problems, questions or concerns. We can certainly see the patient much sooner if necessary.   I spent 25 minutes counseling the patient face to face. The total time spent in the appointment was 30 minutes.    Doroteo Bradford, MD 10/16/2014 3:36 PM  DISCLAIMER:  This note was dictated with voice recognition software.  Similar sounding words can inadvertently be transcribed inaccurately and may not be corrected upon review.

## 2014-10-16 NOTE — Telephone Encounter (Signed)
Talked with Maudie Mercury and told her what the patient needed. She was going to see if someone could help her get the disc and reports.

## 2014-10-16 NOTE — Patient Instructions (Signed)
[place] Discharge Instructions  RECOMMENDATIONS MADE BY THE CONSULTANT AND ANY TEST RESULTS WILL BE SENT TO YOUR REFERRING PHYSICIAN.  EXAM FINDINGS BY THE PHYSICIAN TODAY AND SIGNS OR SYMPTOMS TO REPORT TO CLINIC OR PRIMARY PHYSICIAN: Exam and findings as discussed by Dr. Barnet Glasgow.  You will get aranesp today as your hemoglobin is less than 11 grams. Report increased fatigue or increase shortness of breath.  INSTRUCTIONS/FOLLOW-UP: Labs and aranesp every 3 weeks and office visit in 9 weeks.  Thank you for choosing [facility] to provide your oncology and hematology care.  To afford each patient quality time with our providers, please arrive at least 15 minutes before your scheduled appointment time.  With your help, our goal is to use those 15 minutes to complete the necessary work-up to ensure our physicians have the information they need to help with your evaluation and healthcare recommendations.    Effective January 1st, 2014, we ask that you re-schedule your appointment with our physicians should you arrive 10 or more minutes late for your appointment.  We strive to give you quality time with our providers, and arriving late affects you and other patients whose appointments are after yours.    Again, thank you for choosing [facility].  Our hope is that these requests will decrease the amount of time that you wait before being seen by our physicians.       _____________________________________________________________  Should you have questions after your visit to [facility], please contact our office at [phone] between the hours of 8:30 a.m. and 4:30 p.m.  Voicemails left after 4:30 p.m. will not be returned until the following business day.  For prescription refill requests, have your pharmacy contact our office with your prescription refill request.     _______________________________________________________________  We hope that we have given you very good care.  You may receive a patient satisfaction survey in the mail, please complete it and return it as soon as possible.  We value your feedback!  _______________________________________________________________  Have you asked about our STAR program?  STAR stands for Survivorship Training and Rehabilitation, and this is a nationally recognized cancer care program that focuses on survivorship and rehabilitation.  Cancer and cancer treatments may cause problems, such as, pain, making you feel tired and keeping you from doing the things that you need or want to do. Cancer rehabilitation can help. Our goal is to reduce these troubling effects and help you have the best quality of life possible.  You may receive a survey from a nurse that asks questions about your current state of health.  Based on the survey results, all eligible patients will be referred to the [facility] program for an evaluation so we can better serve you!  A frequently asked questions sheet is available upon request.

## 2014-10-16 NOTE — Progress Notes (Signed)
[name] presented for labwork. Labs per MD order drawn via Peripheral Line 23 gauge needle inserted in right forearm  Good blood return present. Procedure without incident.  Needle removed intact. Patient tolerated procedure well.

## 2014-10-16 NOTE — Telephone Encounter (Signed)
Trish from [facility] records called to let us know that the patient's daughter was there to pick up papers that she needed to take to [facility] per SF. I told her that I didn't see anything documented in patient's chart and wasn't sure what the patient was needing. I told Wannetta Sender if [facility] needed anything from [place] that they could call and we will get it to them.

## 2014-10-16 NOTE — Progress Notes (Signed)
[name] presents today for injection per MD orders. Aranesp 500 mcg administered SQ in left Abdomen. Administration without incident. Patient tolerated well.

## 2014-10-22 ENCOUNTER — Encounter (HOSPITAL_COMMUNITY): Payer: Self-pay

## 2014-10-22 ENCOUNTER — Inpatient Hospital Stay (HOSPITAL_COMMUNITY)
Admission: EM | Admit: 2014-10-22 | Discharge: 2014-10-25 | DRG: 394 | Disposition: A | Discharge status: Home Health | Payer: Medicare Other | Attending: Internal Medicine | Admitting: Internal Medicine

## 2014-10-22 ENCOUNTER — Emergency Department (HOSPITAL_COMMUNITY): Payer: Medicare Other

## 2014-10-22 DIAGNOSIS — I255 Ischemic cardiomyopathy: Secondary | ICD-10-CM | POA: Diagnosis present

## 2014-10-22 DIAGNOSIS — E1165 Type 2 diabetes mellitus with hyperglycemia: Secondary | ICD-10-CM | POA: Diagnosis present

## 2014-10-22 DIAGNOSIS — Z823 Family history of stroke: Secondary | ICD-10-CM

## 2014-10-22 DIAGNOSIS — D62 Acute posthemorrhagic anemia: Secondary | ICD-10-CM | POA: Diagnosis present

## 2014-10-22 DIAGNOSIS — I10 Essential (primary) hypertension: Secondary | ICD-10-CM | POA: Diagnosis present

## 2014-10-22 DIAGNOSIS — E782 Mixed hyperlipidemia: Secondary | ICD-10-CM | POA: Diagnosis present

## 2014-10-22 DIAGNOSIS — G40909 Epilepsy, unspecified, not intractable, without status epilepticus: Secondary | ICD-10-CM | POA: Diagnosis present

## 2014-10-22 DIAGNOSIS — N183 Chronic kidney disease, stage 3 (moderate): Secondary | ICD-10-CM

## 2014-10-22 DIAGNOSIS — I2510 Atherosclerotic heart disease of native coronary artery without angina pectoris: Secondary | ICD-10-CM | POA: Diagnosis present

## 2014-10-22 DIAGNOSIS — M25472 Effusion, left ankle: Secondary | ICD-10-CM

## 2014-10-22 DIAGNOSIS — I129 Hypertensive chronic kidney disease with stage 1 through stage 4 chronic kidney disease, or unspecified chronic kidney disease: Secondary | ICD-10-CM | POA: Diagnosis present

## 2014-10-22 DIAGNOSIS — K649 Unspecified hemorrhoids: Secondary | ICD-10-CM | POA: Diagnosis not present

## 2014-10-22 DIAGNOSIS — Z833 Family history of diabetes mellitus: Secondary | ICD-10-CM

## 2014-10-22 DIAGNOSIS — N189 Chronic kidney disease, unspecified: Secondary | ICD-10-CM | POA: Diagnosis present

## 2014-10-22 DIAGNOSIS — M25471 Effusion, right ankle: Secondary | ICD-10-CM | POA: Diagnosis present

## 2014-10-22 DIAGNOSIS — E119 Type 2 diabetes mellitus without complications: Secondary | ICD-10-CM

## 2014-10-22 DIAGNOSIS — F0390 Unspecified dementia without behavioral disturbance: Secondary | ICD-10-CM | POA: Diagnosis not present

## 2014-10-22 DIAGNOSIS — K922 Gastrointestinal hemorrhage, unspecified: Secondary | ICD-10-CM | POA: Diagnosis present

## 2014-10-22 DIAGNOSIS — I252 Old myocardial infarction: Secondary | ICD-10-CM

## 2014-10-22 DIAGNOSIS — Z87891 Personal history of nicotine dependence: Secondary | ICD-10-CM

## 2014-10-22 DIAGNOSIS — Z794 Long term (current) use of insulin: Secondary | ICD-10-CM

## 2014-10-22 DIAGNOSIS — Z8673 Personal history of transient ischemic attack (TIA), and cerebral infarction without residual deficits: Secondary | ICD-10-CM

## 2014-10-22 DIAGNOSIS — N184 Chronic kidney disease, stage 4 (severe): Secondary | ICD-10-CM | POA: Diagnosis present

## 2014-10-22 DIAGNOSIS — Z951 Presence of aortocoronary bypass graft: Secondary | ICD-10-CM

## 2014-10-22 DIAGNOSIS — I509 Heart failure, unspecified: Secondary | ICD-10-CM | POA: Diagnosis present

## 2014-10-22 LAB — CBC WITH DIFFERENTIAL/PLATELET
Basophils Absolute: 0 10*3/uL (ref 0.0–0.1)
Basophils Relative: 0 % (ref 0–1)
Eosinophils Absolute: 0 10*3/uL (ref 0.0–0.7)
Eosinophils Relative: 0 % (ref 0–5)
HEMATOCRIT: 34.4 % — AB (ref 36.0–46.0)
HEMOGLOBIN: 10.6 g/dL — AB (ref 12.0–15.0)
LYMPHS PCT: 12 % (ref 12–46)
Lymphs Abs: 0.7 10*3/uL (ref 0.7–4.0)
MCH: 24.3 pg — ABNORMAL LOW (ref 26.0–34.0)
MCHC: 30.8 g/dL (ref 30.0–36.0)
MCV: 78.9 fL (ref 78.0–100.0)
MONO ABS: 0.4 10*3/uL (ref 0.1–1.0)
Monocytes Relative: 7 % (ref 3–12)
Neutro Abs: 4.5 10*3/uL (ref 1.7–7.7)
Neutrophils Relative %: 80 % — ABNORMAL HIGH (ref 43–77)
Platelets: 339 10*3/uL (ref 150–400)
RBC: 4.36 MIL/uL (ref 3.87–5.11)
RDW: 21.2 % — AB (ref 11.5–15.5)
WBC: 5.6 10*3/uL (ref 4.0–10.5)

## 2014-10-22 LAB — BASIC METABOLIC PANEL
Anion gap: 14 (ref 5–15)
BUN: 68 mg/dL — ABNORMAL HIGH (ref 6–23)
CO2: 24 mEq/L (ref 19–32)
Calcium: 8.5 mg/dL (ref 8.4–10.5)
Chloride: 105 mEq/L (ref 96–112)
Creatinine, Ser: 2.89 mg/dL — ABNORMAL HIGH (ref 0.50–1.10)
GFR calc Af Amer: 18 mL/min — ABNORMAL LOW (ref >90)
GFR calc non Af Amer: 15 mL/min — ABNORMAL LOW (ref >90)
Glucose, Bld: 201 mg/dL — ABNORMAL HIGH (ref 70–99)
Potassium: 3.6 mEq/L — ABNORMAL LOW (ref 3.7–5.3)
Sodium: 143 mEq/L (ref 137–147)

## 2014-10-22 LAB — HEMOGLOBIN
HEMOGLOBIN: 10.2 g/dL — AB (ref 12.0–15.0)
Hemoglobin: 10.1 g/dL — ABNORMAL LOW (ref 12.0–15.0)

## 2014-10-22 LAB — TYPE AND SCREEN
ABO/RH(D): A POS
ANTIBODY SCREEN: NEGATIVE

## 2014-10-22 LAB — POC OCCULT BLOOD, ED: Fecal Occult Bld: POSITIVE — AB

## 2014-10-22 MED ORDER — ACETAMINOPHEN 650 MG RE SUPP: 650.0000 mg | Freq: Four times a day (QID) | RECTAL | Status: DC | PRN

## 2014-10-22 MED ORDER — DONEPEZIL HCL 5 MG PO TABS
10.0000 mg | ORAL_TABLET | Freq: Every day | ORAL | Status: DC
  Administered 2014-10-22 – 2014-10-24 (×3): 10 mg via ORAL
  Filled 2014-10-22 (×3): qty 2

## 2014-10-22 MED ORDER — TORSEMIDE 20 MG PO TABS
50.0000 mg | ORAL_TABLET | Freq: Every day | ORAL | Status: DC
  Administered 2014-10-22 – 2014-10-25 (×4): 50 mg via ORAL
  Filled 2014-10-22 (×4): qty 3

## 2014-10-22 MED ORDER — FUROSEMIDE 10 MG/ML IJ SOLN: 80.0000 mg | INTRAMUSCULAR | Status: DC

## 2014-10-22 MED ORDER — INSULIN DETEMIR 100 UNIT/ML ~~LOC~~ SOLN
8.0000 [IU] | Freq: Every day | SUBCUTANEOUS | Status: DC
  Administered 2014-10-22 – 2014-10-24 (×3): 8 [IU] via SUBCUTANEOUS
  Filled 2014-10-22 (×4): qty 0.08

## 2014-10-22 MED ORDER — LINAGLIPTIN 5 MG PO TABS
5.0000 mg | ORAL_TABLET | Freq: Every morning | ORAL | Status: DC
  Administered 2014-10-23 – 2014-10-25 (×3): 5 mg via ORAL
  Filled 2014-10-22 (×3): qty 1

## 2014-10-22 MED ORDER — HYDRALAZINE HCL 25 MG PO TABS
75.0000 mg | ORAL_TABLET | Freq: Three times a day (TID) | ORAL | Status: DC
  Administered 2014-10-22 – 2014-10-25 (×9): 75 mg via ORAL
  Filled 2014-10-22 (×9): qty 3

## 2014-10-22 MED ORDER — PRAVASTATIN SODIUM 10 MG PO TABS
20.0000 mg | ORAL_TABLET | Freq: Every day | ORAL | Status: DC
  Administered 2014-10-22 – 2014-10-24 (×3): 20 mg via ORAL
  Filled 2014-10-22 (×3): qty 2

## 2014-10-22 MED ORDER — ONDANSETRON HCL 4 MG PO TABS: 4.0000 mg | ORAL_TABLET | Freq: Four times a day (QID) | ORAL | Status: DC | PRN

## 2014-10-22 MED ORDER — ONDANSETRON HCL 4 MG/2ML IJ SOLN: 4.0000 mg | Freq: Four times a day (QID) | INTRAMUSCULAR | Status: DC | PRN

## 2014-10-22 MED ORDER — METOPROLOL SUCCINATE ER 50 MG PO TB24
50.0000 mg | ORAL_TABLET | Freq: Every morning | ORAL | Status: DC
  Administered 2014-10-23 – 2014-10-25 (×3): 50 mg via ORAL
  Filled 2014-10-22 (×3): qty 1

## 2014-10-22 MED ORDER — ACETAMINOPHEN 325 MG PO TABS: 650.0000 mg | ORAL_TABLET | Freq: Four times a day (QID) | ORAL | Status: DC | PRN

## 2014-10-22 MED ORDER — PANTOPRAZOLE SODIUM 40 MG PO TBEC
40.0000 mg | DELAYED_RELEASE_TABLET | Freq: Every morning | ORAL | Status: DC
  Administered 2014-10-22 – 2014-10-25 (×4): 40 mg via ORAL
  Filled 2014-10-22 (×4): qty 1

## 2014-10-22 MED ORDER — SODIUM CHLORIDE 0.9 % IV SOLN
INTRAVENOUS | Status: AC
  Administered 2014-10-22: 100 mL via INTRAVENOUS

## 2014-10-22 MED ORDER — ISOSORBIDE MONONITRATE ER 30 MG PO TB24
15.0000 mg | ORAL_TABLET | Freq: Every morning | ORAL | Status: DC
  Administered 2014-10-23 – 2014-10-25 (×3): 15 mg via ORAL
  Filled 2014-10-22 (×3): qty 1

## 2014-10-22 MED ORDER — SODIUM CHLORIDE 0.9 % IJ SOLN
3.0000 mL | Freq: Two times a day (BID) | INTRAMUSCULAR | Status: DC
  Administered 2014-10-22 – 2014-10-25 (×6): 3 mL via INTRAVENOUS

## 2014-10-22 MED ORDER — MORPHINE SULFATE 2 MG/ML IJ SOLN: 2.0000 mg | INTRAMUSCULAR | Status: DC | PRN

## 2014-10-22 MED ORDER — SODIUM BICARBONATE 650 MG PO TABS
650.0000 mg | ORAL_TABLET | Freq: Three times a day (TID) | ORAL | Status: DC
  Administered 2014-10-22 – 2014-10-25 (×10): 650 mg via ORAL
  Filled 2014-10-22 (×10): qty 1

## 2014-10-22 MED ORDER — LEVETIRACETAM 100 MG/ML PO SOLN
500.0000 mg | Freq: Two times a day (BID) | ORAL | Status: DC
  Administered 2014-10-22: 500 mg via ORAL
  Filled 2014-10-22 (×3): qty 5

## 2014-10-22 NOTE — ED Notes (Signed)
Not able to obtain IV access, Dr Winfred Leeds notified.  Well be attempted by 3rd Nurse.

## 2014-10-22 NOTE — ED Provider Notes (Signed)
CSN: [id number]     Arrival date & time 10/22/14  6073 History  This chart was scribed for Orlie Dakin, MD by [facility], ED Scribe. The patient was seen in APA02/APA02 and the patient's care was started at 10:21 AM.    Chief Complaint  Patient presents with  . GI Bleeding   level V caveat  Dementia,, history is obtained from the daughter HPI  HPI Comments: [name] is a 73 y.o. female who presents to the Emergency Department complaining of rectal bleeding which began this morning while in the shower. Pt's daughter first noticed it in her bed sheets.This is the first time her daughter has seen any rectal bleeding. Pt currently lives with her daughter. Dr. Berdine Addison is her PCP. Her GI doctor is Dr. Oneida Alar.She had a colonoscopy and had to got [place] for surgery. She will follow up in 3 days. Pt does not smoke, or drink EtOH. She denies SOB and blood in her vomit. No other associated symptoms.  Past Medical History  Diagnosis Date  . Type 2 diabetes mellitus   . Essential hypertension, benign   . History of stroke     Previously on Coumadin  . MI, old     Reported 58  . Seizure disorder   . Coronary atherosclerosis of native coronary artery     Multivessel status post CABG 2002  . History of GI bleed     Erosive gastritis and duodenitis by EGD 2002  . Mixed hyperlipidemia   . Ischemic cardiomyopathy     LVEF 40-45% March 2013  . Dementia   . Stroke   . Seizures   . CKD (chronic kidney disease) stage 4, GFR 15-29 ml/min   . CHF (congestive heart failure)    Past Surgical History  Procedure Laterality Date  . Coronary artery bypass graft  July 2002    LIMA to LAD, SVG to OM1, SVG to OM 2, SVG to RCA  . Tee without cardioversion  01/28/2012    Procedure: TRANSESOPHAGEAL ECHOCARDIOGRAM (TEE);  Surgeon: Lelon Perla, MD;  Location: [facility] ENDOSCOPY;  Service: Cardiovascular;  Laterality: [name];  . Colonoscopy  2002  . Esophagogastroduodenoscopy  2002  .  Esophagogastroduodenoscopy [name] 01/18/2014    XTG:GYIRS hiatal hernia. Abnormal gastric and duodenal bulbar mucosa of uncertain significance - status post gastric bx (chronic gastritis/H.pylori +  . Colonoscopy [name] 06/25/2014    Procedure: COLONOSCOPY;  Surgeon: Rogene Houston, MD;  [facility];  Service: Endoscopy;  Laterality: [name];  . Agile capsule [name] 07/06/2014    Procedure: AGILE CAPSULE;  Surgeon: Danie Binder, MD;  Location: [facility] END[facility];  Service: Endoscopy;  Laterality: [name];  730  . Esophagogastroduodenoscopy [name] 07/20/2014    Procedure: ESOPHAGOGASTRODUODENOSCOPY (EGD);  Surgeon: Daneil Dolin, MD;  [facility];  Service: Endoscopy;  Laterality: [name];  . Givens capsule study [name] 07/20/2014    Procedure: GIVENS CAPSULE STUDY;  Surgeon: Daneil Dolin, MD;  Location: [facility] END[facility];  Service: Endoscopy;  Laterality: [name];   Family History  Problem Relation Age of Onset  . Stroke Father   . Diabetes type II Mother   . Colon cancer Neg Hx    History  Substance Use Topics  . Smoking status: Former Smoker -- 66 years  . Smokeless tobacco: Not on file  . Alcohol Use: No   OB History    No data available     Review of Systems  Unable to perform  ROS HENT: Negative.   Cardiovascular: Positive for leg swelling.       Daughter reports chronic leg edema  Gastrointestinal: Positive for blood in stool. Negative for vomiting.  Musculoskeletal: Negative.   Skin: Negative.   Allergic/Immunologic: Negative.   Neurological: Negative.   Psychiatric/Behavioral: Negative.   All other systems reviewed and are negative.   Allergies  Review of patient's allergies indicates no known allergies.  Home Medications   Prior to Admission medications   Medication Sig Start Date End Date Taking? Authorizing Provider  calcitRIOL (ROCALTROL) 0.25 MCG capsule Take 1 capsule (0.25 mcg total) by mouth daily. 05/02/14   Eugenie Filler, MD  donepezil (ARICEPT) 10 MG tablet Take  10 mg by mouth at bedtime.  01/30/14   Historical Provider, MD  folic acid (FOLVITE) 1 MG tablet Take 1 mg by mouth every morning.     Historical Provider, MD  hydrALAZINE (APRESOLINE) 25 MG tablet Take 75 mg by mouth every 8 (eight) hours.    Historical Provider, MD  insulin detemir (LEVEMIR) 100 UNIT/ML injection Inject 0.08 mLs (8 Units total) into the skin at bedtime. 05/02/14   Eugenie Filler, MD  isosorbide mononitrate (IMDUR) 30 MG 24 hr tablet Take 0.5 mg by mouth every morning.    Historical Provider, MD  levETIRAcetam (KEPPRA) 100 MG/ML solution Take 5 mLs by mouth 2 (two) times daily. 01/13/14   Historical Provider, MD  linagliptin (TRADJENTA) 5 MG TABS tablet Take 5 mg by mouth every morning.     Historical Provider, MD  metoprolol succinate (TOPROL-XL) 100 MG 24 hr tablet Take 50 mg by mouth every morning. Take with or immediately following a meal.    Historical Provider, MD  pantoprazole (PROTONIX) 40 MG tablet Take 40 mg by mouth every morning.    Historical Provider, MD  potassium chloride (K-DUR) 10 MEQ tablet Take 10 mEq by mouth daily. Takes with torsemide 09/21/14   Historical Provider, MD  potassium chloride (MICRO-K) 10 MEQ CR capsule Take 10 mEq by mouth See admin instructions. Alternate taking one Potassium daily with taking one Potassium twice daily with Torsemide as directed    Historical Provider, MD  pravastatin (PRAVACHOL) 20 MG tablet Take 20 mg by mouth at bedtime.     Historical Provider, MD  sodium bicarbonate 650 MG tablet Take 650 mg by mouth 3 (three) times daily.    Historical Provider, MD  torsemide (DEMADEX) 100 MG tablet daily. Taking 1/2 tablet 09/21/14   Historical Provider, MD  Vitamin D, Ergocalciferol, (DRISDOL) 50000 UNITS CAPS Take 50,000 Units by mouth every 7 (seven) days. Takes on Tuesday    Historical Provider, MD   There were no vitals taken for this visit. Physical Exam  Constitutional: She appears well-developed and well-nourished.  HENT:  Head:  Normocephalic and atraumatic.  Eyes: Conjunctivae are normal. Pupils are equal, round, and reactive to light.  Neck: Neck supple. No tracheal deviation present. No thyromegaly present.  Cardiovascular: Normal rate and regular rhythm.   No murmur heard. Pulmonary/Chest: Effort normal and breath sounds normal.  Abdominal: Soft. Bowel sounds are normal. She exhibits no distension. There is no tenderness.  Obese  Genitourinary: Guaiac positive stool.  Brown stool mixed with red blood  Musculoskeletal: Normal range of motion. She exhibits edema. She exhibits no tenderness.  2+ pretibial pitting edema bilaterally  Neurological: She is alert. Coordination normal.  Skin: Skin is warm and dry. No rash noted.  Psychiatric: She has a normal mood and affect.  Nursing note and vitals reviewed.   ED Course  Procedures  DIAGNOSTIC STUDIES: Oxygen Saturation is 95% on room air, adequate by my interpretation.    COORDINATION OF CARE: 10:29 AM Discussed treatment plan with pt at bedside and pt agreed to plan.   Labs Review Labs Reviewed  CBC WITH DIFFERENTIAL  BASIC METABOLIC PANEL  URINALYSIS, ROUTINE W REFLEX MICROSCOPIC   Imaging Review No results found.   EKG Interpretation   Date/Time:  Sunday October 22 2014 10:23:06 EST Ventricular Rate:  87 PR Interval:  133 QRS Duration: 82 QT Interval:  490 QTC Calculation: 590 R Axis:   46 Text Interpretation:  Sinus rhythm Ventricular bigeminy Low voltage,  extremity leads Nonspecific T abnormalities, lateral leads Prolonged QT  interval No significant change since last tracing Confirmed by Winfred Leeds   MD, Jaymond Waage [phone]) on 10/22/2014 10:40:53 AM     Results for orders placed or performed during the hospital encounter of 10/22/14  CBC with Differential  Result Value Ref Range   WBC 5.6 4.0 - 10.5 K/uL   RBC 4.36 3.87 - 5.11 MIL/uL   Hemoglobin 10.6 (L) 12.0 - 15.0 g/dL   HCT 34.4 (L) 36.0 - 46.0 %   MCV 78.9 78.0 - 100.0 fL   MCH  24.3 (L) 26.0 - 34.0 pg   MCHC 30.8 30.0 - 36.0 g/dL   RDW 21.2 (H) 11.5 - 15.5 %   Platelets 339 150 - 400 K/uL   Neutrophils Relative % 80 (H) 43 - 77 %   Neutro Abs 4.5 1.7 - 7.7 K/uL   Lymphocytes Relative 12 12 - 46 %   Lymphs Abs 0.7 0.7 - 4.0 K/uL   Monocytes Relative 7 3 - 12 %   Monocytes Absolute 0.4 0.1 - 1.0 K/uL   Eosinophils Relative 0 0 - 5 %   Eosinophils Absolute 0.0 0.0 - 0.7 K/uL   Basophils Relative 0 0 - 1 %   Basophils Absolute 0.0 0.0 - 0.1 K/uL   Smear Review LARGE PLATELETS PRESENT   Basic metabolic panel  Result Value Ref Range   Sodium 143 137 - 147 mEq/L   Potassium 3.6 (L) 3.7 - 5.3 mEq/L   Chloride 105 96 - 112 mEq/L   CO2 24 19 - 32 mEq/L   Glucose, Bld 201 (H) 70 - 99 mg/dL   BUN 68 (H) 6 - 23 mg/dL   Creatinine, Ser 2.89 (H) 0.50 - 1.10 mg/dL   Calcium 8.5 8.4 - 10.5 mg/dL   GFR calc non Af Amer 15 (L) >90 mL/min   GFR calc Af Amer 18 (L) >90 mL/min   Anion gap 14 5 - 15  POC occult blood, ED  Result Value Ref Range   Fecal Occult Bld POSITIVE (A) NEGATIVE  Type and screen  Result Value Ref Range   ABO/RH(D) A POS    Antibody Screen NEG    Sample Expiration 10/25/2014    Dg Chest 2 View  10/07/2014   CLINICAL DATA:  Shortness of breath.  EXAM: CHEST  2 VIEW  COMPARISON:  06/09/2014 and prior radiograph  FINDINGS: Cardiomegaly, pulmonary vascular congestion and question of mild interstitial edema noted.  Cardiac surgical changes again noted.  Elevation of the right hemidiaphragm is present.  There is no evidence of pleural effusion or pneumothorax.  IMPRESSION: Cardiomegaly with pulmonary vascular congestion and question mild interstitial edema.   Electronically Signed   By: Hassan Rowan M.D.   On: 10/07/2014 18:24   Dg  Chest Portable 1 View  10/22/2014   CLINICAL DATA:  Rectal bleeding  EXAM: PORTABLE CHEST - 1 VIEW  COMPARISON:  10/07/2014  FINDINGS: Lungs are essentially clear. No focal consolidation. No pleural effusion or pneumothorax.   Cardiomegaly.  Postsurgical changes related to prior CABG.  IMPRESSION: No evidence of acute cardiopulmonary disease.   Electronically Signed   By: Julian Hy M.D.   On: 10/22/2014 11:29  i spiok wih nurse regarding bradycardic pulses, recorded, she feel readings to be factitious  MDM  Spoke with Dr.chiu Lan 23 hour observation telemetry diagnosis #1 GI bleeding #2 anemia #3 renal insufficiency #4 hyperglycemia Final diagnoses:  Swelling of both ankles       Orlie Dakin, MD 10/22/14 1209

## 2014-10-22 NOTE — ED Notes (Signed)
MD at bedside. 

## 2014-10-22 NOTE — ED Notes (Signed)
Disregard Hr of 33 and 41 recorded at 10:25,10:24, and 10:18.

## 2014-10-22 NOTE — H&P (Signed)
[facility] History and Physical  [name] KDT:[id number] DOB: [birth year] DOA: 10/22/2014  Referring physician: Emergency Department PCP: [name], MD  Specialists: Dr. Oneida Alar  Chief Complaint: Rectal bleed  HPI: [name] is a 73 y.o. female  With a hx of dm, htn, prior cva, actively treated seizures, CAD s/p CABG, hx of erosive gastritis and duodenitis and who is currently being worked up for GI bleeding as an outpatient who is brought to the ED with gross BRBPR on the AM of admission. In the ED, pt was noted to be heme pos with hgb of 10.6. Given concerns of recurrent GI bleed, hospitalist was consulted.  Pt denies sob or chest pain. No abd pain  Review of Systems:   Per above, the remainder of the 10pt ros reviewed and are neg  Past Medical History  Diagnosis Date  . Type 2 diabetes mellitus   . Essential hypertension, benign   . History of stroke     Previously on Coumadin  . MI, old     Reported 42  . Seizure disorder   . Coronary atherosclerosis of native coronary artery     Multivessel status post CABG 2002  . History of GI bleed     Erosive gastritis and duodenitis by EGD 2002  . Mixed hyperlipidemia   . Ischemic cardiomyopathy     LVEF 40-45% March 2013  . Dementia   . Stroke   . Seizures   . CKD (chronic kidney disease) stage 4, GFR 15-29 ml/min   . CHF (congestive heart failure)    Past Surgical History  Procedure Laterality Date  . Coronary artery bypass graft  July 2002    LIMA to LAD, SVG to OM1, SVG to OM 2, SVG to RCA  . Tee without cardioversion  01/28/2012    Procedure: TRANSESOPHAGEAL ECHOCARDIOGRAM (TEE);  Surgeon: Lelon Perla, MD;  Location: [facility] ENDOSCOPY;  Service: Cardiovascular;  Laterality: [name];  . Colonoscopy  2002  . Esophagogastroduodenoscopy  2002  . Esophagogastroduodenoscopy [name] 01/18/2014    DXI:PJASN hiatal hernia. Abnormal gastric and duodenal bulbar mucosa of uncertain significance - status post gastric bx  (chronic gastritis/H.pylori +  . Colonoscopy [name] 06/25/2014    Procedure: COLONOSCOPY;  Surgeon: Rogene Houston, MD;  Location[facility]O [facility];  Service: Endoscopy;  Laterality: [name];  . Agile capsule [name] 07/06/2014    Procedure: AGILE CAPSULE;  Surgeon: Danie Binder, MD;  [facility];  Service: Endoscopy;  Laterality: [name];  730  . Esophagogastroduodenoscopy [name] 07/20/2014    Procedure: ESOPHAGOGASTRODUODENOSCOPY (EGD);  Surgeon: Daneil Dolin, MD;  Location: [facility];  Service: Endoscopy;  Laterality: [name];  . Givens capsule study [name] 07/20/2014    Procedure: GIVENS CAPSULE STUDY;  Surgeon: Daneil Dolin, MD;  [facility];  Service: Endoscopy;  Laterality: [name];   Social History:  reports that she has quit smoking. She does not have any smokeless tobacco history on file. She reports that she does not drink alcohol or use illicit drugs.  where does patient live--home, ALF, SNF? and with whom if at home?  Can patient participate in ADLs?  No Known Allergies  Family History  Problem Relation Age of Onset  . Stroke Father   . Diabetes type II Mother   . Colon cancer Neg Hx     (be sure to complete)  Prior to Admission medications   Medication Sig Start Date End Date Taking? Authorizing Provider  calcitRIOL (ROCALTROL)  0.25 MCG capsule Take 1 capsule (0.25 mcg total) by mouth daily. 05/02/14   Eugenie Filler, MD  donepezil (ARICEPT) 10 MG tablet Take 10 mg by mouth at bedtime.  01/30/14   Historical Provider, MD  folic acid (FOLVITE) 1 MG tablet Take 1 mg by mouth every morning.     Historical Provider, MD  hydrALAZINE (APRESOLINE) 25 MG tablet Take 75 mg by mouth every 8 (eight) hours.    Historical Provider, MD  insulin detemir (LEVEMIR) 100 UNIT/ML injection Inject 0.08 mLs (8 Units total) into the skin at bedtime. 05/02/14   Eugenie Filler, MD  isosorbide mononitrate (IMDUR) 30 MG 24 hr tablet Take 0.5 mg by mouth every morning.    Historical Provider, MD   levETIRAcetam (KEPPRA) 100 MG/ML solution Take 5 mLs by mouth 2 (two) times daily. 01/13/14   Historical Provider, MD  linagliptin (TRADJENTA) 5 MG TABS tablet Take 5 mg by mouth every morning.     Historical Provider, MD  metoprolol succinate (TOPROL-XL) 100 MG 24 hr tablet Take 50 mg by mouth every morning. Take with or immediately following a meal.    Historical Provider, MD  pantoprazole (PROTONIX) 40 MG tablet Take 40 mg by mouth every morning.    Historical Provider, MD  potassium chloride (K-DUR) 10 MEQ tablet Take 10 mEq by mouth daily. Takes with torsemide 09/21/14   Historical Provider, MD  potassium chloride (MICRO-K) 10 MEQ CR capsule Take 10 mEq by mouth See admin instructions. Alternate taking one Potassium daily with taking one Potassium twice daily with Torsemide as directed    Historical Provider, MD  pravastatin (PRAVACHOL) 20 MG tablet Take 20 mg by mouth at bedtime.     Historical Provider, MD  sodium bicarbonate 650 MG tablet Take 650 mg by mouth 3 (three) times daily.    Historical Provider, MD  torsemide (DEMADEX) 100 MG tablet daily. Taking 1/2 tablet 09/21/14   Historical Provider, MD  Vitamin D, Ergocalciferol, (DRISDOL) 50000 UNITS CAPS Take 50,000 Units by mouth every 7 (seven) days. Takes on Tuesday    Historical Provider, MD   Physical Exam: Filed Vitals:   10/22/14 1024 10/22/14 1025 10/22/14 1030 10/22/14 1200  BP:  154/97  150/67  Pulse: 41 41    Temp:  98 F (36.7 C)    TempSrc:  Oral    Resp: 21 21 23 22   SpO2: 96% 95%       General:  Awake, in nad  Eyes: PERRL B  ENT: membranes moist, dentition fair  Neck: trachea midline, neck supple  Cardiovascular: regular, s1, s2  Respiratory: normal resp effort, no wheezing  Abdomen: soft,nondistended  Skin: normal skin turgor, no abnormal skin lesions seen  Musculoskeletal: perfused, no clubbing  Psychiatric: confused, no auditory/visual hallucinations  Neurologic: cn2-12 grossly intact,  strength/sensation intact  Labs on Admission:  Basic Metabolic Panel:  Recent Labs Lab 10/22/14 1102  NA 143  K 3.6*  CL 105  CO2 24  GLUCOSE 201*  BUN 68*  CREATININE 2.89*  CALCIUM 8.5   Liver Function Tests: No results for input(s): AST, ALT, ALKPHOS, BILITOT, PROT, ALBUMIN in the last 168 hours. No results for input(s): LIPASE, AMYLASE in the last 168 hours. No results for input(s): AMMONIA in the last 168 hours. CBC:  Recent Labs Lab 10/16/14 1410 10/22/14 1102  WBC 5.7 5.6  NEUTROABS 4.1 4.5  HGB 10.4* 10.6*  HCT 33.6* 34.4*  MCV 78.9 78.9  PLT 242 339   Cardiac  Enzymes: No results for input(s): CKTOTAL, CKMB, CKMBINDEX, TROPONINI in the last 168 hours.  BNP (last 3 results)  Recent Labs  03/15/14 1645 04/27/14 1536 06/09/14 1021  PROBNP 37134.0* 51631.0* 30358.0*   CBG: No results for input(s): GLUCAP in the last 168 hours.  Radiological Exams on Admission: Dg Chest Portable 1 View  10/22/2014   CLINICAL DATA:  Rectal bleeding  EXAM: PORTABLE CHEST - 1 VIEW  COMPARISON:  10/07/2014  FINDINGS: Lungs are essentially clear. No focal consolidation. No pleural effusion or pneumothorax.  Cardiomegaly.  Postsurgical changes related to prior CABG.  IMPRESSION: No evidence of acute cardiopulmonary disease.   Electronically Signed   By: Julian Hy M.D.   On: 10/22/2014 11:29    Assessment/Plan Principal Problem:   Lower GI bleeding Active Problems:   Hypertension   Diabetes mellitus   Seizure disorder   CKD (chronic kidney disease) stage 3, GFR 30-59 ml/min   Dementia   CHF (congestive heart failure)   Acute blood loss anemia   1. Recurrent lower GI bleed 1. Followed by Dr. Oneida Alar. S/p endoscopic eval not revealing source of bleeding. As of 9/15, recs for referal to [facility] for further work up. No documentation from [facility] is available at this time 2. Hgb otherwise stable at this time, not requiring tx yet 3. Will check q4 h/h 4. Consult GI  for further recs 5. Cont on clear liquid diet for now 6. Admit to med-tele 2. HTN 1. Stable 2. Cont home meds 3. DM 1. Will cont home lantus 2. Will add SSI coverage 4. Seizures 1. Cont AED from per home regimen 5. CKD 1. Stable 2. Follow renal fx 6. Dementia 1. Seems stable 2. On dementia meds. Will cont for now 7. CHF 1. Euvolemic in ED 2. Monitor 3. Cont home diuretics 8. DVT prophylaxis 1. SCD's  Code Status: Full (must indicate code status--if unknown or must be presumed, indicate so) Family Communication: pt in room (indicate person spoken with, if applicable, with phone number if by telephone) Disposition Plan: Pending (indicate anticipated LOS)  Time spent: 74min  [facility], [facility] Pager [phone]  If 7PM-7AM, please contact night-coverage [email or web address] Password [facility] 10/22/2014, 12:44 PM

## 2014-10-22 NOTE — ED Notes (Addendum)
Daughter reports pt had bright red blood coming out of her rectum this morning in the shower.  Pt denies any pain, nausea, dizziness, or sob.  Daughter reports pt has had multiple blood transfusions since April and had colonoscopy in aug or sept.  Reports last blood transfusion was approx 3 weeks ago and pt is waiting to have another procedure scheduled at [facility] to see why pt is losing blood.  Family unsure of the name of the procedure.   Also reports pt has gained weight recently due to fluid.  Reports swelling to lower extremities.  Denies SOB or cough.

## 2014-10-23 ENCOUNTER — Encounter (HOSPITAL_COMMUNITY): Payer: Self-pay | Admitting: Gastroenterology

## 2014-10-23 DIAGNOSIS — E1165 Type 2 diabetes mellitus with hyperglycemia: Secondary | ICD-10-CM | POA: Diagnosis present

## 2014-10-23 DIAGNOSIS — Z833 Family history of diabetes mellitus: Secondary | ICD-10-CM | POA: Diagnosis not present

## 2014-10-23 DIAGNOSIS — F0390 Unspecified dementia without behavioral disturbance: Secondary | ICD-10-CM | POA: Diagnosis present

## 2014-10-23 DIAGNOSIS — Z951 Presence of aortocoronary bypass graft: Secondary | ICD-10-CM | POA: Diagnosis not present

## 2014-10-23 DIAGNOSIS — I2510 Atherosclerotic heart disease of native coronary artery without angina pectoris: Secondary | ICD-10-CM | POA: Diagnosis present

## 2014-10-23 DIAGNOSIS — Z794 Long term (current) use of insulin: Secondary | ICD-10-CM | POA: Diagnosis not present

## 2014-10-23 DIAGNOSIS — N184 Chronic kidney disease, stage 4 (severe): Secondary | ICD-10-CM | POA: Diagnosis present

## 2014-10-23 DIAGNOSIS — I509 Heart failure, unspecified: Secondary | ICD-10-CM | POA: Diagnosis present

## 2014-10-23 DIAGNOSIS — I129 Hypertensive chronic kidney disease with stage 1 through stage 4 chronic kidney disease, or unspecified chronic kidney disease: Secondary | ICD-10-CM | POA: Diagnosis present

## 2014-10-23 DIAGNOSIS — I255 Ischemic cardiomyopathy: Secondary | ICD-10-CM | POA: Diagnosis present

## 2014-10-23 DIAGNOSIS — K649 Unspecified hemorrhoids: Secondary | ICD-10-CM | POA: Diagnosis present

## 2014-10-23 DIAGNOSIS — Z823 Family history of stroke: Secondary | ICD-10-CM | POA: Diagnosis not present

## 2014-10-23 DIAGNOSIS — I252 Old myocardial infarction: Secondary | ICD-10-CM | POA: Diagnosis not present

## 2014-10-23 DIAGNOSIS — Z8673 Personal history of transient ischemic attack (TIA), and cerebral infarction without residual deficits: Secondary | ICD-10-CM | POA: Diagnosis not present

## 2014-10-23 DIAGNOSIS — G40909 Epilepsy, unspecified, not intractable, without status epilepticus: Secondary | ICD-10-CM | POA: Diagnosis present

## 2014-10-23 DIAGNOSIS — E782 Mixed hyperlipidemia: Secondary | ICD-10-CM | POA: Diagnosis present

## 2014-10-23 DIAGNOSIS — K922 Gastrointestinal hemorrhage, unspecified: Secondary | ICD-10-CM | POA: Diagnosis present

## 2014-10-23 DIAGNOSIS — D62 Acute posthemorrhagic anemia: Secondary | ICD-10-CM | POA: Diagnosis present

## 2014-10-23 DIAGNOSIS — Z87891 Personal history of nicotine dependence: Secondary | ICD-10-CM | POA: Diagnosis not present

## 2014-10-23 LAB — CBC
HCT: 32.8 % — ABNORMAL LOW (ref 36.0–46.0)
HCT: 34.2 % — ABNORMAL LOW (ref 36.0–46.0)
HCT: 32.9 % — ABNORMAL LOW (ref 36.0–46.0)
HEMATOCRIT: 33.4 % — AB (ref 36.0–46.0)
HEMOGLOBIN: 10.1 g/dL — AB (ref 12.0–15.0)
HEMOGLOBIN: 10.2 g/dL — AB (ref 12.0–15.0)
Hemoglobin: 10 g/dL — ABNORMAL LOW (ref 12.0–15.0)
Hemoglobin: 10 g/dL — ABNORMAL LOW (ref 12.0–15.0)
MCH: 23.6 pg — AB (ref 26.0–34.0)
MCH: 23.7 pg — AB (ref 26.0–34.0)
MCH: 23.9 pg — ABNORMAL LOW (ref 26.0–34.0)
MCH: 24.2 pg — AB (ref 26.0–34.0)
MCHC: 29.8 g/dL — AB (ref 30.0–36.0)
MCHC: 29.9 g/dL — AB (ref 30.0–36.0)
MCHC: 30.4 g/dL (ref 30.0–36.0)
MCHC: 30.8 g/dL (ref 30.0–36.0)
MCV: 78.7 fL (ref 78.0–100.0)
MCV: 78.7 fL (ref 78.0–100.0)
MCV: 79 fL (ref 78.0–100.0)
MCV: 79.5 fL (ref 78.0–100.0)
PLATELETS: 245 10*3/uL (ref 150–400)
PLATELETS: 269 10*3/uL (ref 150–400)
PLATELETS: 288 10*3/uL (ref 150–400)
Platelets: 329 10*3/uL (ref 150–400)
RBC: 4.17 MIL/uL (ref 3.87–5.11)
RBC: 4.18 MIL/uL (ref 3.87–5.11)
RBC: 4.23 MIL/uL (ref 3.87–5.11)
RBC: 4.3 MIL/uL (ref 3.87–5.11)
RDW: 20.8 % — ABNORMAL HIGH (ref 11.5–15.5)
RDW: 20.9 % — ABNORMAL HIGH (ref 11.5–15.5)
RDW: 21.1 % — ABNORMAL HIGH (ref 11.5–15.5)
RDW: 21.4 % — AB (ref 11.5–15.5)
WBC: 4.4 10*3/uL (ref 4.0–10.5)
WBC: 4.6 10*3/uL (ref 4.0–10.5)
WBC: 4.6 10*3/uL (ref 4.0–10.5)
WBC: 6.1 10*3/uL (ref 4.0–10.5)

## 2014-10-23 LAB — GLUCOSE, CAPILLARY
GLUCOSE-CAPILLARY: 155 mg/dL — AB (ref 70–99)
Glucose-Capillary: 160 mg/dL — ABNORMAL HIGH (ref 70–99)
Glucose-Capillary: 114 mg/dL — ABNORMAL HIGH (ref 70–99)
Glucose-Capillary: 97 mg/dL (ref 70–99)
Glucose-Capillary: 161 mg/dL — ABNORMAL HIGH (ref 70–99)
Glucose-Capillary: 235 mg/dL — ABNORMAL HIGH (ref 70–99)

## 2014-10-23 LAB — COMPREHENSIVE METABOLIC PANEL
ALT: 19 U/L (ref 0–35)
AST: 27 U/L (ref 0–37)
Albumin: 2.1 g/dL — ABNORMAL LOW (ref 3.5–5.2)
Alkaline Phosphatase: 88 U/L (ref 39–117)
Anion gap: 13 (ref 5–15)
BUN: 62 mg/dL — ABNORMAL HIGH (ref 6–23)
CO2: 25 mEq/L (ref 19–32)
Calcium: 8 mg/dL — ABNORMAL LOW (ref 8.4–10.5)
Chloride: 108 mEq/L (ref 96–112)
Creatinine, Ser: 2.7 mg/dL — ABNORMAL HIGH (ref 0.50–1.10)
GFR calc Af Amer: 19 mL/min — ABNORMAL LOW (ref >90)
GFR calc non Af Amer: 16 mL/min — ABNORMAL LOW (ref >90)
GLUCOSE: 140 mg/dL — AB (ref 70–99)
Potassium: 3 mEq/L — ABNORMAL LOW (ref 3.7–5.3)
SODIUM: 146 meq/L (ref 137–147)
TOTAL PROTEIN: 7.5 g/dL (ref 6.0–8.3)
Total Bilirubin: 0.4 mg/dL (ref 0.3–1.2)

## 2014-10-23 LAB — MAGNESIUM: MAGNESIUM: 2 mg/dL (ref 1.5–2.5)

## 2014-10-23 LAB — MRSA PCR SCREENING: MRSA by PCR: NEGATIVE

## 2014-10-23 MED ORDER — LEVETIRACETAM IN NACL 500 MG/100ML IV SOLN
500.0000 mg | Freq: Two times a day (BID) | INTRAVENOUS | Status: DC
  Administered 2014-10-24 – 2014-10-25 (×3): 500 mg via INTRAVENOUS
  Filled 2014-10-23 (×5): qty 100

## 2014-10-23 MED ORDER — PSYLLIUM 95 % PO PACK
1.0000 | PACK | Freq: Every day | ORAL | Status: DC
  Administered 2014-10-23 – 2014-10-25 (×3): 1 via ORAL
  Filled 2014-10-23 (×5): qty 1

## 2014-10-23 MED ORDER — CETYLPYRIDINIUM CHLORIDE 0.05 % MT LIQD
7.0000 mL | Freq: Two times a day (BID) | OROMUCOSAL | Status: DC
  Administered 2014-10-23 – 2014-10-25 (×6): 7 mL via OROMUCOSAL

## 2014-10-23 MED ORDER — HYDROCORTISONE ACETATE 25 MG RE SUPP
25.0000 mg | Freq: Two times a day (BID) | RECTAL | Status: DC
Start: 2014-10-23 — End: 2014-10-25
  Administered 2014-10-23 – 2014-10-25 (×5): 25 mg via RECTAL
  Filled 2014-10-23 (×9): qty 1

## 2014-10-23 MED ORDER — SODIUM CHLORIDE 0.9 % IV SOLN
INTRAVENOUS | Status: AC
  Administered 2014-10-23 (×2): via INTRAVENOUS

## 2014-10-23 MED ORDER — POTASSIUM CHLORIDE 10 MEQ/100ML IV SOLN
10.0000 meq | INTRAVENOUS | Status: AC
  Administered 2014-10-23 (×4): 10 meq via INTRAVENOUS
  Filled 2014-10-23 (×4): qty 100

## 2014-10-23 MED ORDER — CHLORHEXIDINE GLUCONATE 0.12 % MT SOLN
15.0000 mL | Freq: Two times a day (BID) | OROMUCOSAL | Status: DC
  Administered 2014-10-23 – 2014-10-25 (×5): 15 mL via OROMUCOSAL
  Filled 2014-10-23 (×5): qty 15

## 2014-10-23 NOTE — Care Management Note (Addendum)
    Page 1 of 2   10/25/2014     2:10:42 PM CARE MANAGEMENT NOTE 10/25/2014  Patient:  [name], [name]   Account Number:  [id number]  Date Initiated:  10/23/2014  Documentation initiated by:  Jolene Provost  Subjective/Objective Assessment:   Pt is from home, lives with daughter, pt is independent with ADL's. Pt has walker at home, no [facility] services or medication needs prior to admission.     Action/Plan:   Plan to dishcarge home with self care. No CM needs identified at this time. Will continue to follow for CM needs.   Anticipated DC Date:  10/24/2014   Anticipated DC Plan:  [place]  CM consult      [facility] Choice  HOME HEALTH   Choice offered to / List presented to:  [name]        [place] arranged  HH-1 RN  [place].   Status of service:  Completed, signed off Medicare Important Message given?  YES (If response is "NO", the following Medicare IM given date fields will be blank) Date Medicare IM given:  10/25/2014 Medicare IM given by:  Theophilus Kinds Date Additional Medicare IM given:   Additional Medicare IM given by:    Discharge Disposition:  [place]  Per UR Regulation:    If discussed at Long Length of Stay Meetings, dates discussed:    Comments:  10/24/14 [place], RN BSN CM Pt discharged home today with [facility] RN and Pt (pt daughter choice). [name] of [facility] is aware and will collect the pts information from the chart. Hh services to start within 48 hours of discharge. No DME needs noted. Pts daughter and pts nurse aware of discharge arrangements.  10/24/2014 [place], RN, MSN, [facility] Plan for pt to [place] with [place]. Emma, of [facility], per pt's choice, aware of [place] needs and will obtain information from pt's chart. Pt's daughter called and made aware of discharge plans. No further CM needs at this time.  10/23/2014 [place], RN, MSN, [facility]

## 2014-10-23 NOTE — Progress Notes (Signed)
UR completed 

## 2014-10-23 NOTE — Progress Notes (Signed)
Pt transferred to step down. Report was given to [place] Master, Larwance Rote D, RN

## 2014-10-23 NOTE — Consult Note (Addendum)
Referring Provider: Dr. Marylu Lund Primary Care Physician:  [name], MD Primary Gastroenterologist:  Dr. Oneida Alar   Date of Admission: 10/22/14 Date of Consultation: 10/23/14  Reason for Consultation:  Rectal bleeding  HPI:  [name] is a 73 year old female with a history of recurrent profound anemia and history of melena, with work-up to include EGD in 12/2013 noting abnormal  gastric and duodenal bulbar mucosa (H.pylori) now s/p treatment, colonoscopy 06/25/2014 by Dr. Laural Golden with 24mm polyp (tubular adenoma), pandiverticulosis. Underwent EGD with capsule placement in Aug 2015 during hospitalization: tiny gastric polyps not manipulated, minimal pigmentation of duodenal bulbar likely insignificant. Capsule was placed. Unfortunately, capsule study was not complete; images reviewed by Dr. Oneida Alar. Last seen as outpatient by our office in Sept 2015 and referred to [facility] secondary to obscure GI bleed and transfusion dependent anemia. She has been followed by hematology and receiving Aranesp every 3 weeks if Hgb less than 11. Evaluated at [facility] Oct 18, 2014. Felt to have bleeding from small AVMs and/or erosions of proximal jejunum, likely accessible by push enteroscopy. Capsule images reviewed by Dr. Arsenio Loader. Dr. Arsenio Loader did not feel she would be appropriate for general anesthesia for attempted double balloon enteroscopy. Daughter states an upcoming appt is being arranged.   Admitted 11/29 with rectal bleeding. Hgb 10.6 on admission. Daughter states was constipated prior to incidence of rectal bleeding. States small amount of blood on sheets yesterday morning, with bright red blood per rectum noticed while in shower.Review of notes overnight reveal "gushes of blood with slightest movement" while on third floor, around 12am. Transferred to ICU. No further evidence of rectal bleeding since that time. Denies abdominal pain, N/V, fever. Daughter denies any NSAIDs, aspirin powders. Hgb today  10.1.   Past Medical History  Diagnosis Date  . Type 2 diabetes mellitus   . Essential hypertension, benign   . History of stroke     Previously on Coumadin  . MI, old     Reported 19  . Seizure disorder   . Coronary atherosclerosis of native coronary artery     Multivessel status post CABG 2002  . History of GI bleed     Erosive gastritis and duodenitis by EGD 2002  . Mixed hyperlipidemia   . Ischemic cardiomyopathy     LVEF 40-45% March 2013  . Dementia   . Stroke   . Seizures   . CKD (chronic kidney disease) stage 4, GFR 15-29 ml/min   . CHF (congestive heart failure)     Past Surgical History  Procedure Laterality Date  . Coronary artery bypass graft  July 2002    LIMA to LAD, SVG to OM1, SVG to OM 2, SVG to RCA  . Tee without cardioversion  01/28/2012    Procedure: TRANSESOPHAGEAL ECHOCARDIOGRAM (TEE);  Surgeon: Lelon Perla, MD;  Location: [facility] ENDOSCOPY;  Service: Cardiovascular;  Laterality: [name];  . Colonoscopy  2002  . Esophagogastroduodenoscopy  2002  . Esophagogastroduodenoscopy [name] 01/18/2014    [id number] hiatal hernia. Abnormal gastric and duodenal bulbar mucosa of uncertain significance - status post gastric bx (chronic gastritis/H.pylori +  . Colonoscopy [name] 06/25/2014    Dr. Laural Golden: tubular adenoma, pandiverticulosis.   . Agile capsule [name] 07/06/2014    Procedure: AGILE CAPSULE;  Surgeon: Danie Binder, MD;  Location: [facility] ENDO SUITE;  Service: Endoscopy;  Laterality: [name];  730  . Esophagogastroduodenoscopy [name] 07/20/2014    Dr. Gala Romney: tiny gastric polyps not manipulated. minimal pigmentation of duodenal bulb  likely not significant  . Givens capsule study [name] 07/20/2014    incomplete    Prior to Admission medications   Medication Sig Start Date End Date Taking? Authorizing Provider  calcitRIOL (ROCALTROL) 0.25 MCG capsule Take 1 capsule (0.25 mcg total) by mouth daily. 05/02/14  Yes Eugenie Filler, MD  folic acid (FOLVITE) 1 MG tablet Take 1 mg by mouth  every morning.    Yes Historical Provider, MD  hydrALAZINE (APRESOLINE) 25 MG tablet Take 75 mg by mouth every 8 (eight) hours.   Yes Historical Provider, MD  isosorbide mononitrate (IMDUR) 30 MG 24 hr tablet Take 0.5 mg by mouth every morning.   Yes Historical Provider, MD  metoprolol succinate (TOPROL-XL) 100 MG 24 hr tablet Take 50 mg by mouth every morning. Take with or immediately following a meal.   Yes Historical Provider, MD  torsemide (DEMADEX) 100 MG tablet daily. Taking 1/2 tablet 09/21/14  Yes Historical Provider, MD  donepezil (ARICEPT) 10 MG tablet Take 10 mg by mouth at bedtime.  01/30/14   Historical Provider, MD  insulin detemir (LEVEMIR) 100 UNIT/ML injection Inject 0.08 mLs (8 Units total) into the skin at bedtime. 05/02/14   Eugenie Filler, MD  levETIRAcetam (KEPPRA) 100 MG/ML solution Take 5 mLs by mouth 2 (two) times daily. 01/13/14   Historical Provider, MD  linagliptin (TRADJENTA) 5 MG TABS tablet Take 5 mg by mouth every morning.     Historical Provider, MD  pantoprazole (PROTONIX) 40 MG tablet Take 40 mg by mouth every morning.    Historical Provider, MD  potassium chloride (K-DUR) 10 MEQ tablet Take 10 mEq by mouth daily. Takes with torsemide 09/21/14   Historical Provider, MD  potassium chloride (MICRO-K) 10 MEQ CR capsule Take 10 mEq by mouth See admin instructions. Alternate taking one Potassium daily with taking one Potassium twice daily with Torsemide as directed    Historical Provider, MD  pravastatin (PRAVACHOL) 20 MG tablet Take 20 mg by mouth at bedtime.     Historical Provider, MD  sodium bicarbonate 650 MG tablet Take 650 mg by mouth 3 (three) times daily.    Historical Provider, MD  Vitamin D, Ergocalciferol, (DRISDOL) 50000 UNITS CAPS Take 50,000 Units by mouth every 7 (seven) days. Takes on Tuesday    Historical Provider, MD    Current Facility-Administered Medications  Medication Dose Route Frequency Provider Last Rate Last Dose  . 0.9 %  sodium chloride  infusion   Intravenous Continuous Rise Patience, MD 100 mL/hr at 10/23/14 0151    . acetaminophen (TYLENOL) tablet 650 mg  650 mg Oral Q6H PRN Donne Hazel, MD       Or  . acetaminophen (TYLENOL) suppository 650 mg  650 mg Rectal Q6H PRN Donne Hazel, MD      . antiseptic oral rinse (CPC / CETYLPYRIDINIUM CHLORIDE 0.05%) solution 7 mL  7 mL Mouth Rinse q12n4p Rise Patience, MD      . chlorhexidine (PERIDEX) 0.12 % solution 15 mL  15 mL Mouth Rinse BID Rise Patience, MD   15 mL at 10/23/14 0733  . donepezil (ARICEPT) tablet 10 mg  10 mg Oral QHS Donne Hazel, MD   10 mg at 10/22/14 2137  . hydrALAZINE (APRESOLINE) tablet 75 mg  75 mg Oral 3 times per day Donne Hazel, MD   75 mg at 10/23/14 0541  . insulin detemir (LEVEMIR) injection 8 Units  8 Units Subcutaneous QHS Donne Hazel, MD  8 Units at 10/22/14 2137  . isosorbide mononitrate (IMDUR) 24 hr tablet 15 mg  15 mg Oral q morning - 10a Donne Hazel, MD   15 mg at 10/22/14 1245  . [START ON 10/24/2014] levETIRAcetam (KEPPRA) IVPB 500 mg/100 mL premix  500 mg Intravenous Q12H Rise Patience, MD      . linagliptin (TRADJENTA) tablet 5 mg  5 mg Oral q morning - 10a Donne Hazel, MD   5 mg at 10/22/14 1245  . metoprolol succinate (TOPROL-XL) 24 hr tablet 50 mg  50 mg Oral q morning - 10a Donne Hazel, MD   50 mg at 10/22/14 1245  . morphine 2 MG/ML injection 2 mg  2 mg Intravenous Q4H PRN Donne Hazel, MD      . ondansetron [facility]) tablet 4 mg  4 mg Oral Q6H PRN Donne Hazel, MD       Or  . ondansetron [facility]) injection 4 mg  4 mg Intravenous Q6H PRN Donne Hazel, MD      . pantoprazole (PROTONIX) EC tablet 40 mg  40 mg Oral q morning - 10a Donne Hazel, MD   40 mg at 10/22/14 1407  . potassium chloride 10 mEq in 100 mL IVPB  10 mEq Intravenous Q1 Hr x 4 Donne Hazel, MD   10 mEq at 10/23/14 0844  . pravastatin (PRAVACHOL) tablet 20 mg  20 mg Oral QHS Donne Hazel, MD   20 mg at 10/22/14 2137  .  sodium bicarbonate tablet 650 mg  650 mg Oral TID Donne Hazel, MD   650 mg at 10/22/14 2137  . sodium chloride 0.9 % injection 3 mL  3 mL Intravenous Q12H Donne Hazel, MD   3 mL at 10/22/14 2138  . torsemide (DEMADEX) tablet 50 mg  50 mg Oral Daily Donne Hazel, MD   50 mg at 10/22/14 1407    Allergies as of 10/22/2014  . (No Known Allergies)    Family History  Problem Relation Age of Onset  . Stroke Father   . Diabetes type II Mother   . Colon cancer Neg Hx     History   Social History  . Marital Status: Single    Spouse Name: [name]    Number of Children: [name]  . Years of Education: [name]   Occupational History  . Not on file.   Social History Main Topics  . Smoking status: Former Smoker -- 73 years  . Smokeless tobacco: Not on file  . Alcohol Use: No  . Drug Use: No  . Sexual Activity: Not on file   Other Topics Concern  . Not on file   Social History Narrative    Review of Systems: limited due to patient's cognitive status, answers "no" to majority of questions.  Gen: Denies fever, chills, loss of appetite, change in weight or weight loss CV: Denies chest pain, heart palpitations, syncope, edema  Resp: Denies shortness of breath with rest, cough, wheezing GI: see HPI GU : Denies urinary burning, urinary frequency, urinary incontinence.  MS: Denies joint pain,swelling, cramping Derm: Denies rash, itching, dry skin Psych: Denies depression, anxiety,confusion, or memory loss Heme: see HPI  Physical Exam: Vital signs in last 24 hours: Temp:  [97 F (36.1 C)-98 F (36.7 C)] 97.9 F (36.6 C) (11/30 0751) Pulse Rate:  [33-128] 128 (11/30 0800) Resp:  [14-25] 21 (11/30 0800) BP: (143-178)/(62-97) 147/66 mmHg (11/30 0800) SpO2:  [94 %-100 %]  98 % (11/30 0800) Weight:  [162 lb 14.7 oz (73.9 kg)-163 lb 5.8 oz (74.1 kg)] 162 lb 14.7 oz (73.9 kg) (11/30 0500) Last BM Date: 10/22/14 General:   Alert and oriented to person and place only, no distress, pleasant.   Head:  Normocephalic and atraumatic. Eyes:  Sclera clear, no icterus.   Conjunctiva pink. Ears:  Normal auditory acuity. Nose:  No deformity, discharge,  or lesions. Mouth:  Poor dentition Lungs:  Scattered rhonchi Heart:  S1 S2 present, irregular Abdomen:  Soft, nontender and nondistended. No masses, hepatosplenomegaly or hernias noted. Normal bowel sounds, without guarding, and without rebound.   Rectal:  External exam only during wound consult, no obvious external hemorrhoids Msk:  Symmetrical without gross deformities. Normal posture. Extremities:  2+ lower extremity pitting edema Neurologic:  Alert and  oriented to person and place only, unsure year.  Skin:  Intact without significant lesions or rashes. Psych:  Alert and cooperative. Normal mood and affect.  Intake/Output from previous day: 11/29 0701 - 11/30 0700 In: 1830 [P.O.:360; I.V.:1470] Out: 500 [Urine:500] Intake/Output this shift:    Lab Results:  Recent Labs  10/22/14 1102  10/22/14 2059 10/23/14 0034 10/23/14 0508  WBC 5.6  --   --  6.1 4.6  HGB 10.6*  < > 10.2* 10.0* 10.1*  HCT 34.4*  --   --  33.4* 32.8*  PLT 339  --   --  269 329  < > = values in this interval not displayed. BMET  Recent Labs  10/22/14 1102 10/23/14 0508  NA 143 146  K 3.6* 3.0*  CL 105 108  CO2 24 25  GLUCOSE 201* 140*  BUN 68* 62*  CREATININE 2.89* 2.70*  CALCIUM 8.5 8.0*   LFT  Recent Labs  10/23/14 0508  PROT 7.5  ALBUMIN 2.1*  AST 27  ALT 19  ALKPHOS 88  BILITOT 0.4    Studies/Results: Dg Chest Portable 1 View  10/22/2014   CLINICAL DATA:  Rectal bleeding  EXAM: PORTABLE CHEST - 1 VIEW  COMPARISON:  10/07/2014  FINDINGS: Lungs are essentially clear. No focal consolidation. No pleural effusion or pneumothorax.  Cardiomegaly.  Postsurgical changes related to prior CABG.  IMPRESSION: No evidence of acute cardiopulmonary disease.   Electronically Signed   By: Julian Hy M.D.   On: 10/22/2014 11:29     Impression: 73 year old female admitted with acute onset of painless rectal bleeding without evidence of significant acute blood loss anemia. Hemodynamically stable. Last episode of bleeding around 03UU. History complicated by recurrent profound anemia and likely small bowel etiology to obscure GI bleed; evaluated at [facility] with plans for further push enteroscopy. Thorough work-up with colonoscopy/EGD/capsule study as outlined above. Currently, would continue to observe for any further evidence of overt GI bleeding; query benign source, diverticular origin. Does not appear to be hemodynamically significant. If further rectal bleeding, consider repeat colonoscopy this admission. Otherwise, will continue with serial H/H, monitoring, and follow-up with [facility] as outpatient.   Plan: Serial H/H Full liquid diet Monitor for further overt GI bleeding Outpatient follow-up at [facility] PPI daily   Orvil Feil, ANP-BC [facility] Gastroenterology     LOS: 1 day    10/23/2014, 8:45 AM    Addendum at 1050: add benefiber, anusol suppositories. If rebleeds, consider flex sig vs colonoscopy.  Orvil Feil, ANP-BC [facility] Gastroenterology   Attending note:  Pt seen and examined this afternoon; no further bleeding at this time.  Agree with above assessment and recommendations.

## 2014-10-23 NOTE — Consult Note (Signed)
WOC wound consult note Consultation completed via [place] with bedside nurse assistance  Reason for Consult: evaluation of pressure ulcers Wound type: healed Stage III Pressure ulcers left and right buttocks  Pressure Ulcer POA: Yes Measurement: Left buttock scar tissue with scant dried blood: area that is healed measurement you will see documented on the nursing flow sheet.  I did not really appreciate any major skin openings today. Right buttock scar tissue with scant dried blood at the proximal edges of the wound, measurement you will see on the nursing flow sheet is the healed tissue.  I also did not see any significant skin openings, only scared tissue.   Wound bed: none Drainage (amount, consistency, odor) scant old blood  Periwound: intact  Dressing procedure/placement/frequency: Silicone foam to protect and insulate.  May need to use barrier cream only if dressing adherence becomes an issue. Pt able to change positions and turn easily in bed, will not add air mattress at this time.   Discussed POC with patient and bedside nurse.  Re consult if needed, will not follow at this time. Thanks  Manasi Dishon Kellogg, [place] [phone])

## 2014-10-23 NOTE — Progress Notes (Signed)
E-link RN/MD updated on 19:32 cbg=235 with no RX for s/s insulin @ this time  Pt to get pm levemir.

## 2014-10-23 NOTE — Progress Notes (Signed)
[facility] PROGRESS NOTE  Clarise Chacko Gagan ZHG:[id number] DOB: 06-26-1941 DOA: 10/22/2014 PCP: [name], MD  Assessment/Plan: 1. Recurrent lower GI bleed 1. Followed by Dr. Oneida Alar as an outpatient. S/p endoscopic eval not revealing source of bleeding. As of 9/15, recs for referal to [facility] for further work up. Pt reportedly had not followed up at [facility] 2. Hgb otherwise stable at this time, not requiring tx yet 3. Consulted GI with recs to continue monitoring for now. Would need follow up with [facility] 4. Cont on full liquid diet for now 2. HTN 1. Stable 2. Cont home meds 3. DM 1. Will cont home lantus 2. Will add SSI coverage 4. Seizures 1. Cont AED from per home regimen 5. CKD 1. Stable 2. Follow renal fx 6. Dementia 1. Seems stable 2. On dementia meds. Will cont for now 7. CHF 1. Euvolemic in ED 2. Monitor 3. Cont home diuretics 8. DVT prophylaxis 1. SCD's  Code Status: Full Family Communication: Pt in room (indicate person spoken with, relationship, and if by phone, the number) Disposition Plan: Pending   Consultants:  GI  Procedures:    Antibiotics:   (indicate start date, and stop date if known)  HPI/Subjective: Pt noted to have BRBPR overnight, prompting transfer to stepdown  Objective: Filed Vitals:   10/23/14 1200 10/23/14 1300 10/23/14 1400 10/23/14 1458  BP: 184/71 122/90 130/65 130/65  Pulse: 62 78 41   Temp: 97.7 F (36.5 C)     TempSrc: Oral     Resp: 19 16 19    Height:      Weight:      SpO2: 98% 99% 97%     Intake/Output Summary (Last 24 hours) at 10/23/14 1612 Last data filed at 10/23/14 1507  Gross per 24 hour  Intake   1930 ml  Output   1425 ml  Net    505 ml   Filed Weights   10/22/14 1420 10/23/14 0100 10/23/14 0500  Weight: 74.1 kg (163 lb 5.8 oz) 73.9 kg (162 lb 14.7 oz) 73.9 kg (162 lb 14.7 oz)    Exam:   General:  Awake, in nad  Cardiovascular: regular, s1, s2  Respiratory: normal resp effort, no  wheezing  Abdomen: soft,nondistended  Musculoskeletal: perfused, no clubbing   Data Reviewed: Basic Metabolic Panel:  Recent Labs Lab 10/22/14 1102 10/23/14 0034 10/23/14 0508  NA 143  --  146  K 3.6*  --  3.0*  CL 105  --  108  CO2 24  --  25  GLUCOSE 201*  --  140*  BUN 68*  --  62*  CREATININE 2.89*  --  2.70*  CALCIUM 8.5  --  8.0*  MG  --  2.0  --    Liver Function Tests:  Recent Labs Lab 10/23/14 0508  AST 27  ALT 19  ALKPHOS 88  BILITOT 0.4  PROT 7.5  ALBUMIN 2.1*   No results for input(s): LIPASE, AMYLASE in the last 168 hours. No results for input(s): AMMONIA in the last 168 hours. CBC:  Recent Labs Lab 10/22/14 1102  10/22/14 2059 10/23/14 0034 10/23/14 0508 10/23/14 0852 10/23/14 1223  WBC 5.6  --   --  6.1 4.6 4.6 4.4  NEUTROABS 4.5  --   --   --   --   --   --   HGB 10.6*  < > 10.2* 10.0* 10.1* 10.2* 10.0*  HCT 34.4*  --   --  33.4* 32.8* 34.2* 32.9*  MCV  78.9  --   --  79.0 78.7 79.5 78.7  PLT 339  --   --  269 329 245 288  < > = values in this interval not displayed. Cardiac Enzymes: No results for input(s): CKTOTAL, CKMB, CKMBINDEX, TROPONINI in the last 168 hours. BNP (last 3 results)  Recent Labs  03/15/14 1645 04/27/14 1536 06/09/14 1021  PROBNP 37134.0* 51631.0* 30358.0*   CBG:  Recent Labs Lab 10/23/14 0038 10/23/14 0357 10/23/14 0729 10/23/14 1152  GLUCAP 160* 155* 114* 97    Recent Results (from the past 240 hour(s))  MRSA PCR Screening     Status: None   Collection Time: 10/23/14  1:00 AM  Result Value Ref Range Status   MRSA by PCR NEGATIVE NEGATIVE Final    Comment:        The GeneXpert MRSA Assay (FDA approved for NASAL specimens only), is one component of a comprehensive MRSA colonization surveillance program. It is not intended to diagnose MRSA infection nor to guide or monitor treatment for MRSA infections.      Studies: Dg Chest Portable 1 View  10/22/2014   CLINICAL DATA:  Rectal bleeding   EXAM: PORTABLE CHEST - 1 VIEW  COMPARISON:  10/07/2014  FINDINGS: Lungs are essentially clear. No focal consolidation. No pleural effusion or pneumothorax.  Cardiomegaly.  Postsurgical changes related to prior CABG.  IMPRESSION: No evidence of acute cardiopulmonary disease.   Electronically Signed   By: Julian Hy M.D.   On: 10/22/2014 11:29    Scheduled Meds: . antiseptic oral rinse  7 mL Mouth Rinse q12n4p  . chlorhexidine  15 mL Mouth Rinse BID  . donepezil  10 mg Oral QHS  . hydrALAZINE  75 mg Oral 3 times per day  . hydrocortisone  25 mg Rectal BID  . insulin detemir  8 Units Subcutaneous QHS  . isosorbide mononitrate  15 mg Oral q morning - 10a  . [START ON 10/24/2014] levETIRAcetam  500 mg Intravenous Q12H  . linagliptin  5 mg Oral q morning - 10a  . metoprolol succinate  50 mg Oral q morning - 10a  . pantoprazole  40 mg Oral q morning - 10a  . pravastatin  20 mg Oral QHS  . psyllium  1 packet Oral Daily  . sodium bicarbonate  650 mg Oral TID  . sodium chloride  3 mL Intravenous Q12H  . torsemide  50 mg Oral Daily   Continuous Infusions: . sodium chloride 100 mL/hr at 10/23/14 0151    Principal Problem:   Lower GI bleeding Active Problems:   Hypertension   Diabetes mellitus   Seizure disorder   CKD (chronic kidney disease) stage 3, GFR 30-59 ml/min   Dementia   CHF (congestive heart failure)   Acute blood loss anemia  Time spent: 66min  [facility], [facility] Pager [phone]. If 7PM-7AM, please contact night-coverage at [email or web address], password [facility] 10/23/2014, 4:12 PM  LOS: 1 day

## 2014-10-23 NOTE — Plan of Care (Signed)
Problem: Phase I Progression Outcomes Goal: Pain controlled with appropriate interventions Outcome: Completed/Met Date Met:  10/23/14 Goal: OOB as tolerated unless otherwise ordered Outcome: Progressing Goal: Initial discharge plan identified Outcome: Completed/Met Date Met:  10/23/14 Goal: Voiding-avoid urinary catheter unless indicated Outcome: Progressing Goal: Hemodynamically stable Outcome: Completed/Met Date Met:  10/23/14  Problem: Phase II Progression Outcomes Goal: No active bleeding Outcome: Progressing Goal: Hemodynamically stable Outcome: Completed/Met Date Met:  10/23/14 Goal: H&H stablized < 1gm drop in 24 hrs Outcome: Progressing Goal: Progress activity as tolerated unless otherwise ordered Outcome: Progressing Goal: Tolerating diet Outcome: Completed/Met Date Met:  10/23/14

## 2014-10-23 NOTE — Progress Notes (Signed)
Patient is experiencing heavy bleeding from rectum. Gushes of blood with the slightest movement. MD is made aware. George Hugh D, RN

## 2014-10-23 NOTE — Progress Notes (Signed)
Pt had a 17 beat run of V-Tach. MD is made aware. George Hugh D, RN

## 2014-10-24 DIAGNOSIS — M25472 Effusion, left ankle: Secondary | ICD-10-CM

## 2014-10-24 DIAGNOSIS — M25471 Effusion, right ankle: Secondary | ICD-10-CM

## 2014-10-24 LAB — GLUCOSE, CAPILLARY
GLUCOSE-CAPILLARY: 166 mg/dL — AB (ref 70–99)
Glucose-Capillary: 196 mg/dL — ABNORMAL HIGH (ref 70–99)
Glucose-Capillary: 158 mg/dL — ABNORMAL HIGH (ref 70–99)
Glucose-Capillary: 224 mg/dL — ABNORMAL HIGH (ref 70–99)
Glucose-Capillary: 123 mg/dL — ABNORMAL HIGH (ref 70–99)
Glucose-Capillary: 187 mg/dL — ABNORMAL HIGH (ref 70–99)

## 2014-10-24 LAB — CBC
HCT: 32.6 % — ABNORMAL LOW (ref 36.0–46.0)
HCT: 31.9 % — ABNORMAL LOW (ref 36.0–46.0)
HEMOGLOBIN: 9.6 g/dL — AB (ref 12.0–15.0)
Hemoglobin: 9.7 g/dL — ABNORMAL LOW (ref 12.0–15.0)
MCH: 23.7 pg — ABNORMAL LOW (ref 26.0–34.0)
MCH: 23.9 pg — ABNORMAL LOW (ref 26.0–34.0)
MCHC: 29.8 g/dL — AB (ref 30.0–36.0)
MCHC: 30.1 g/dL (ref 30.0–36.0)
MCV: 79.5 fL (ref 78.0–100.0)
MCV: 79.6 fL (ref 78.0–100.0)
PLATELETS: 323 10*3/uL (ref 150–400)
Platelets: 311 10*3/uL (ref 150–400)
RBC: 4.1 MIL/uL (ref 3.87–5.11)
RBC: 4.01 MIL/uL (ref 3.87–5.11)
RDW: 21.6 % — AB (ref 11.5–15.5)
RDW: 21 % — ABNORMAL HIGH (ref 11.5–15.5)
WBC: 5.4 10*3/uL (ref 4.0–10.5)
WBC: 4.4 10*3/uL (ref 4.0–10.5)

## 2014-10-24 LAB — BASIC METABOLIC PANEL
Anion gap: 13 (ref 5–15)
BUN: 52 mg/dL — AB (ref 6–23)
CALCIUM: 7.9 mg/dL — AB (ref 8.4–10.5)
CO2: 19 meq/L (ref 19–32)
CREATININE: 2.48 mg/dL — AB (ref 0.50–1.10)
Chloride: 109 mEq/L (ref 96–112)
GFR calc Af Amer: 21 mL/min — ABNORMAL LOW (ref >90)
GFR calc non Af Amer: 18 mL/min — ABNORMAL LOW (ref >90)
Glucose, Bld: 171 mg/dL — ABNORMAL HIGH (ref 70–99)
Potassium: 4.3 mEq/L (ref 3.7–5.3)
Sodium: 141 mEq/L (ref 137–147)

## 2014-10-24 NOTE — Clinical Documentation Improvement (Signed)
  Query #1 "CHF" documented in current medical record.  Treated with Lasix 80 mg IV x 1 on admission and Demadex daily per current MAR.  Please document the ACUITY and TYPE of CHF monitored and treated this admission.  Query #2 K+ 3.0 on 10/23/14.  Patient received KCL 10 mEq in 100 ml IVPB x 4 on 10/23/14.  Please document a diagnosis related to the abnormal potassium level, including treatment provided.   Thank You, Erling Conte ,RN Clinical Documentation Specialist:  [phone] [facility]

## 2014-10-24 NOTE — Telephone Encounter (Signed)
REVIEWED-NO ADDITIONAL RECOMMENDATIONS. 

## 2014-10-24 NOTE — Progress Notes (Signed)
[facility] PROGRESS NOTE  [name] LNL:[id number] DOB: 1941/07/12 DOA: 10/22/2014 PCP: [name], MD  Assessment/Plan: 1. Recurrent lower GI bleed 1. Followed by Dr. Oneida Alar as an outpatient. S/p endoscopic eval not revealing source of bleeding. As of 9/15, recs for referal to [facility] for further work up. Pt reportedly had not followed up at [facility] 2. Slight drop in hgb overnight with no reports of gross bleeding 3. GI following with recs to continue monitoring for now.  4. Would need follow up with [facility] when discharged 5. Cont on soft diet for now 2. HTN 1. Stable 2. Cont home meds 3. DM 1. Will cont home lantus 2. Will add SSI coverage 4. Seizures 1. Cont AED from per home regimen 5. CKD 1. Stable 2. Follow renal fx 6. Dementia 1. Seems stable 2. On dementia meds. Will cont for now 7. CHF 1. Euvolemic in ED 2. Monitor 3. Cont home diuretics 8. DVT prophylaxis 1. SCD's  Code Status: Full Family Communication: Pt in room Disposition Plan: Possible home 12/2 if hgb stable and if OK with GI   Consultants:  GI  Procedures:    Antibiotics:    HPI/Subjective: No acute events noted overnight. Pt is eager to go home  Objective: Filed Vitals:   10/24/14 1000 10/24/14 1100 10/24/14 1200 10/24/14 1300  BP: 146/119 144/74 157/76 119/59  Pulse: 98 94 99 87  Temp:      TempSrc:      Resp: 19 18 18 16   Height:      Weight:      SpO2: 100% 100% 98% 97%    Intake/Output Summary (Last 24 hours) at 10/24/14 1309 Last data filed at 10/24/14 0700  Gross per 24 hour  Intake   1900 ml  Output   1425 ml  Net    475 ml   Filed Weights   10/23/14 0100 10/23/14 0500 10/24/14 0500  Weight: 73.9 kg (162 lb 14.7 oz) 73.9 kg (162 lb 14.7 oz) 78.5 kg (173 lb 1 oz)    Exam:   General:  Awake, in nad  Cardiovascular: regular, s1, s2  Respiratory: normal resp effort, no wheezing  Abdomen: soft,nondistended  Musculoskeletal: perfused, no  clubbing   Data Reviewed: Basic Metabolic Panel:  Recent Labs Lab 10/22/14 1102 10/23/14 0034 10/23/14 0508 10/24/14 0830  NA 143  --  146 141  K 3.6*  --  3.0* 4.3  CL 105  --  108 109  CO2 24  --  25 19  GLUCOSE 201*  --  140* 171*  BUN 68*  --  62* 52*  CREATININE 2.89*  --  2.70* 2.48*  CALCIUM 8.5  --  8.0* 7.9*  MG  --  2.0  --   --    Liver Function Tests:  Recent Labs Lab 10/23/14 0508  AST 27  ALT 19  ALKPHOS 88  BILITOT 0.4  PROT 7.5  ALBUMIN 2.1*   No results for input(s): LIPASE, AMYLASE in the last 168 hours. No results for input(s): AMMONIA in the last 168 hours. CBC:  Recent Labs Lab 10/22/14 1102  10/23/14 0034 10/23/14 0508 10/23/14 0852 10/23/14 1223 10/24/14 0404  WBC 5.6  --  6.1 4.6 4.6 4.4 5.4  NEUTROABS 4.5  --   --   --   --   --   --   HGB 10.6*  < > 10.0* 10.1* 10.2* 10.0* 9.7*  HCT 34.4*  --  33.4* 32.8* 34.2* 32.9* 32.6*  MCV 78.9  --  79.0 78.7 79.5 78.7 79.5  PLT 339  --  269 329 245 288 323  < > = values in this interval not displayed. Cardiac Enzymes: No results for input(s): CKTOTAL, CKMB, CKMBINDEX, TROPONINI in the last 168 hours. BNP (last 3 results)  Recent Labs  03/15/14 1645 04/27/14 1536 06/09/14 1021  PROBNP 37134.0* 51631.0* 30358.0*   CBG:  Recent Labs Lab 10/23/14 1930 10/24/14 0025 10/24/14 0416 10/24/14 0734 10/24/14 1128  GLUCAP 235* 196* 166* 158* 224*    Recent Results (from the past 240 hour(s))  MRSA PCR Screening     Status: None   Collection Time: 10/23/14  1:00 AM  Result Value Ref Range Status   MRSA by PCR NEGATIVE NEGATIVE Final    Comment:        The GeneXpert MRSA Assay (FDA approved for NASAL specimens only), is one component of a comprehensive MRSA colonization surveillance program. It is not intended to diagnose MRSA infection nor to guide or monitor treatment for MRSA infections.      Studies: No results found.  Scheduled Meds: . antiseptic oral rinse  7 mL  Mouth Rinse q12n4p  . chlorhexidine  15 mL Mouth Rinse BID  . donepezil  10 mg Oral QHS  . hydrALAZINE  75 mg Oral 3 times per day  . hydrocortisone  25 mg Rectal BID  . insulin detemir  8 Units Subcutaneous QHS  . isosorbide mononitrate  15 mg Oral q morning - 10a  . levETIRAcetam  500 mg Intravenous Q12H  . linagliptin  5 mg Oral q morning - 10a  . metoprolol succinate  50 mg Oral q morning - 10a  . pantoprazole  40 mg Oral q morning - 10a  . pravastatin  20 mg Oral QHS  . psyllium  1 packet Oral Daily  . sodium bicarbonate  650 mg Oral TID  . sodium chloride  3 mL Intravenous Q12H  . torsemide  50 mg Oral Daily   Continuous Infusions:    Principal Problem:   Lower GI bleeding Active Problems:   Hypertension   Diabetes mellitus   Seizure disorder   CKD (chronic kidney disease) stage 3, GFR 30-59 ml/min   Dementia   CHF (congestive heart failure)   Acute blood loss anemia  Time spent: 91min  [facility], [place] [facility] Pager [phone]. If 7PM-7AM, please contact night-coverage at [email or web address], password [facility] 10/24/2014, 1:09 PM  LOS: 2 days

## 2014-10-24 NOTE — Progress Notes (Signed)
    Subjective: Patient states she had one BM yesterday which was "normal" for her. Denies abdominal pain, N/V/D, hematochezia, and melena. Also denies chest pain, shortness of breath. Spoke with nursing staff due to patient dementia and states previous shift indicated the patient attempted to have a BM yesterday but withotu success.  No further overt GI bleeding noted.   Objective: Vital signs in last 24 hours: Temp:  [97.3 F (36.3 C)-98.1 F (36.7 C)] 97.6 F (36.4 C) (12/01 0400) Pulse Rate:  [41-95] 58 (12/01 0700) Resp:  [12-29] 15 (12/01 0700) BP: (122-184)/(49-90) 128/62 mmHg (12/01 0700) SpO2:  [95 %-100 %] 100 % (12/01 0700) Weight:  [173 lb 1 oz (78.5 kg)] 173 lb 1 oz (78.5 kg) (12/01 0500) Last BM Date: 10/22/14 General:   Alert and oriented to person and place only, pleasant Head:  Normocephalic and atraumatic. Eyes:  No icterus, sclera clear. Conjuctiva pink.  Mouth:  Poor dentition.  Neck:  Supple, without thyromegaly or masses.  Heart:  S1, S2 present, no murmurs noted.  Lungs: Scattered rhonchi, no other adventitious sounds, no signs of respiratory distress.  Abdomen:  Bowel sounds present, soft, non-tender, non-distended. No HSM or hernias noted. No rebound or guarding. No masses appreciated  Neurologic:  Alert and  oriented to person and place;  Otherwise grossly normal neurologically. Skin:  Warm and dry, intact without significant lesions.  Psych:  Alert and cooperative. Normal mood and affect.  Intake/Output from previous day: 11/30 0701 - 12/01 0700 In: 2420 [P.O.:120; I.V.:1900; IV Piggyback:400] Out: 1425 [Urine:1425] Intake/Output this shift:     Lab Results:  Recent Labs  10/23/14 0852 10/23/14 1223 10/24/14 0404  WBC 4.6 4.4 5.4  HGB 10.2* 10.0* 9.7*  HCT 34.2* 32.9* 32.6*  PLT 245 288 323   BMET  Recent Labs  10/22/14 1102 10/23/14 0508  NA 143 146  K 3.6* 3.0*  CL 105 108  CO2 24 25  GLUCOSE 201* 140*  BUN 68* 62*  CREATININE  2.89* 2.70*  CALCIUM 8.5 8.0*   LFT  Recent Labs  10/23/14 0508  PROT 7.5  ALBUMIN 2.1*  AST 27  ALT 19  ALKPHOS 88  BILITOT 0.4     Assessment: 73 year old female admitted with painless rectal bleeding, unknown etiology query benign source versus diverticular. No overt GI bleed since 11/30 approximately 12:01am. Non-symptomatic and continued hemodynamically stable. H/H drifted some this morning to 9.7/32.6 from 10.0/32.9 the previous day. Possibly dilutional, IVF order expired approx 8 hours ago. Will continue to monitor and recheck CBC later today for stability or continued downward trend. If H/H stable and no further GI bleeding may be able to sign off and plan for GI follow-up at [place]: Recheck CBC later today and tomorrow morning Continue to monitor for GI bleed Outpatient follow-up with [facility] Continue daily PPI Advance diet to soft mechanical  Walden Field, AGNP-C Adult & Gerontological Nurse Practitioner [facility]    LOS: 2 days    10/24/2014, 8:54 AM

## 2014-10-24 NOTE — Progress Notes (Signed)
Inpatient Diabetes Program Recommendations  AACE/ADA: New Consensus Statement on Inpatient Glycemic Control (2013)  Target Ranges:  Prepandial:   less than 140 mg/dL      Peak postprandial:   less than 180 mg/dL (1-2 hours)      Critically ill patients:  140 - 180 mg/dL   Results for NORELL, BRISBIN (MRN [id number]) as of 10/24/2014 09:36  Ref. Range 10/23/2014 07:29 10/23/2014 11:52 10/23/2014 16:37 10/23/2014 19:30 10/24/2014 00:25 10/24/2014 04:16 10/24/2014 07:34  Glucose-Capillary Latest Range: 70-99 mg/dL 114 (H) 97 161 (H) 235 (H) 196 (H) 166 (H) 158 (H)   Diabetes history: DM2 Outpatient Diabetes medications: Levemir 8 units QHS, Tradjenta 5 mg QAM Current orders for Inpatient glycemic control: Levemir 8 units QHS, Tradjenta 5 mg QAM  Inpatient Diabetes Program Recommendations Correction (SSI): Please consider ordering CBGs with Novolog correction ACHS (if patient is not eating well, please order Q4H). Diet: Noted diet was advanced this morning to Soft diet; added Carb Modified to diet order.  Thanks, Barnie Alderman, RN, MSN, CCRN, CDE Diabetes Coordinator Inpatient Diabetes Program [phone] (Team Pager) [phone] (AP office) [phone] [facility] office)

## 2014-10-25 DIAGNOSIS — N189 Chronic kidney disease, unspecified: Secondary | ICD-10-CM | POA: Diagnosis present

## 2014-10-25 DIAGNOSIS — N184 Chronic kidney disease, stage 4 (severe): Secondary | ICD-10-CM

## 2014-10-25 LAB — CBC
HCT: 32 % — ABNORMAL LOW (ref 36.0–46.0)
Hemoglobin: 9.7 g/dL — ABNORMAL LOW (ref 12.0–15.0)
MCH: 24.1 pg — ABNORMAL LOW (ref 26.0–34.0)
MCHC: 30.3 g/dL (ref 30.0–36.0)
MCV: 79.6 fL (ref 78.0–100.0)
Platelets: 326 10*3/uL (ref 150–400)
RBC: 4.02 MIL/uL (ref 3.87–5.11)
RDW: 20.8 % — ABNORMAL HIGH (ref 11.5–15.5)
WBC: 6.2 10*3/uL (ref 4.0–10.5)

## 2014-10-25 LAB — GLUCOSE, CAPILLARY
GLUCOSE-CAPILLARY: 148 mg/dL — AB (ref 70–99)
GLUCOSE-CAPILLARY: 137 mg/dL — AB (ref 70–99)
Glucose-Capillary: 182 mg/dL — ABNORMAL HIGH (ref 70–99)
Glucose-Capillary: 109 mg/dL — ABNORMAL HIGH (ref 70–99)

## 2014-10-25 MED ORDER — HYDROCORTISONE ACETATE 25 MG RE SUPP: 25.0000 mg | Freq: Two times a day (BID) | RECTAL | Status: DC

## 2014-10-25 NOTE — Plan of Care (Signed)
Problem: Phase I Progression Outcomes Goal: Voiding-avoid urinary catheter unless indicated Outcome: Completed/Met Date Met:  10/25/14     

## 2014-10-25 NOTE — Progress Notes (Signed)
    Subjective: Patient did well last night, no overt bleeding episodes per patient and per nursing staff. Denies abdominal pain, N/V/D, hematochezia or melena. Also denies chest pain and shortness of breath. States "I'm feeling pretty good, I'd like to go home."   Objective: Vital signs in last 24 hours: Temp:  [97.1 F (36.2 C)-97.9 F (36.6 C)] 97.4 F (36.3 C) (12/02 0558) Pulse Rate:  [71-99] 95 (12/02 0558) Resp:  [16-19] 18 (12/02 0558) BP: (103-157)/(39-119) 140/82 mmHg (12/02 0558) SpO2:  [95 %-100 %] 95 % (12/02 0558) Weight:  [173 lb 12.8 oz (78.835 kg)] 173 lb 12.8 oz (78.835 kg) (12/02 0558) Last BM Date: 10/22/14 General:   Alert and oriented x 2, pleasant, resting calmly in hospital bed. Head:  Normocephalic and atraumatic. Eyes:  No icterus, sclera clear. Conjuctiva pink.   Heart:  S1, S2 present, no murmurs noted.  Lungs: Scattered rhonchi noted.  Abdomen:  Bowel sounds present, soft, non-tender, non-distended. No HSM or hernias noted. No rebound or guarding. No masses appreciated  Neurologic:  Alert and  oriented to person and place;  Otherwise grossly normal neurologically. Skin:  Warm and dry, intact without significant lesions.  Psych:  Alert and cooperative. Normal mood and affect.  Intake/Output from previous day: 12/01 0701 - 12/02 0700 In: 240 [P.O.:240] Out: 400 [Urine:400] Intake/Output this shift:    Lab Results:  Recent Labs  10/24/14 0404 10/24/14 1425 10/25/14 0426  WBC 5.4 4.4 6.2  HGB 9.7* 9.6* 9.7*  HCT 32.6* 31.9* 32.0*  PLT 323 311 326   BMET  Recent Labs  10/22/14 1102 10/23/14 0508 10/24/14 0830  NA 143 146 141  K 3.6* 3.0* 4.3  CL 105 108 109  CO2 24 25 19   GLUCOSE 201* 140* 171*  BUN 68* 62* 52*  CREATININE 2.89* 2.70* 2.48*  CALCIUM 8.5 8.0* 7.9*   LFT  Recent Labs  10/23/14 0508  PROT 7.5  ALBUMIN 2.1*  AST 27  ALT 19  ALKPHOS 88  BILITOT 0.4    Studies/Results: No results found.  Assessment: 73  year old female admitted with painless rectal bleeding, unknown etiology query benign source versus diverticular. No overt GI bleed since 11/30 approximately 12:01am. Non-symptomatic and continued hemodynamically stable. H/H yesterday and this morning stabalized at 9.6/31.9 and 9.7/32 with no further downward trend and no further overt s/s of bleeding. Plan follow-up with [facility] for continued workup post-discharge with appointment already scheduled per patient's daughter.   Plan: Stable from a GI standpoint for outpatient follow-up Outpatient follow-up with [facility], appointment already scheduled per patient's daughter  Continue supportive measures with annusol  Walden Field, AGNP-C Adult & Gerontological Nurse Practitioner [facility]    LOS: 3 days    10/25/2014, 8:27 AM

## 2014-10-25 NOTE — Progress Notes (Signed)
[name] discharged home with daughter per MD order.  Discharge instructions reviewed and discussed with the patient and daughter at bedside, all questions and concerns answered. Copy of instructions and scripts given to patient.    Medication List    TAKE these medications        calcitRIOL 0.25 MCG capsule  Commonly known as:  ROCALTROL  Take 1 capsule (0.25 mcg total) by mouth daily.     donepezil 10 MG tablet  Commonly known as:  ARICEPT  Take 10 mg by mouth at bedtime.     folic acid 1 MG tablet  Commonly known as:  FOLVITE  Take 1 mg by mouth every morning.     hydrALAZINE 25 MG tablet  Commonly known as:  APRESOLINE  Take 75 mg by mouth every 8 (eight) hours.     hydrocortisone 25 MG suppository  Commonly known as:  ANUSOL-HC  Place 1 suppository (25 mg total) rectally 2 (two) times daily.     insulin detemir 100 UNIT/ML injection  Commonly known as:  LEVEMIR  Inject 0.08 mLs (8 Units total) into the skin at bedtime.     isosorbide mononitrate 30 MG 24 hr tablet  Commonly known as:  IMDUR  Take 0.5 mg by mouth every morning.     levETIRAcetam 100 MG/ML solution  Commonly known as:  KEPPRA  Take 5 mLs by mouth 2 (two) times daily.     metoprolol succinate 100 MG 24 hr tablet  Commonly known as:  TOPROL-XL  Take 50 mg by mouth every morning. Take with or immediately following a meal.     pantoprazole 40 MG tablet  Commonly known as:  PROTONIX  Take 40 mg by mouth every morning.     potassium chloride 10 MEQ tablet  Commonly known as:  K-DUR  Take 10 mEq by mouth daily. Takes with torsemide     pravastatin 20 MG tablet  Commonly known as:  PRAVACHOL  Take 20 mg by mouth at bedtime.     sodium bicarbonate 650 MG tablet  Take 650 mg by mouth 3 (three) times daily.     torsemide 100 MG tablet  Commonly known as:  DEMADEX  Take 50 mg by mouth daily.     TRADJENTA 5 MG Tabs tablet  Generic drug:  linagliptin  Take 5 mg by mouth every morning.         IV site discontinued and catheter remains intact. Site without signs and symptoms of complications. Dressing and pressure applied.  Patient escorted to car by Lovena Le, RN in a wheelchair,  no distress noted upon discharge.  [name] 10/25/2014 4:21 PM

## 2014-10-25 NOTE — Plan of Care (Signed)
Problem: Phase I Progression Outcomes Goal: OOB as tolerated unless otherwise ordered Outcome: Completed/Met Date Met:  10/25/14     

## 2014-10-25 NOTE — Progress Notes (Signed)
Inpatient Diabetes Program Recommendations  AACE/ADA: New Consensus Statement on Inpatient Glycemic Control (2013)  Target Ranges:  Prepandial:   less than 140 mg/dL      Peak postprandial:   less than 180 mg/dL (1-2 hours)      Critically ill patients:  140 - 180 mg/dL   Results for LAKIYA, COTTAM (MRN [id number]) as of 10/25/2014 07:52  Ref. Range 10/24/2014 04:16 10/24/2014 07:34 10/24/2014 11:28 10/24/2014 18:19 10/24/2014 20:13 10/25/2014 00:07 10/25/2014 04:34 10/25/2014 07:34  Glucose-Capillary Latest Range: 70-99 mg/dL 166 (H) 158 (H) 224 (H) 123 (H) 187 (H) 182 (H) 148 (H) 109 (H)   Diabetes history: DM2 Outpatient Diabetes medications: Levemir 8 units QHS, Tradjenta 5 mg QAM Current orders for Inpatient glycemic control: Levemir 8 units QHS, Tradjenta 5 mg QAM  Inpatient Diabetes Program Recommendations Correction (SSI): Please consider ordering CBGs with Novolog correction ACHS.  Note: Noted in MD progress note for 10/24/14 that CBGs with SSI would be ordered but there is not any orders to reflect.   Thanks, Barnie Alderman, RN, MSN, CCRN, CDE Diabetes Coordinator Inpatient Diabetes Program [phone] (Team Pager) [phone] ([place] office) [phone] [facility] office)

## 2014-10-25 NOTE — Discharge Summary (Signed)
Physician Discharge Summary  [name] KVQ:[id number] DOB: 1941-01-13 DOA: 10/22/2014  PCP: [name], MD  Admit date: 10/22/2014 Discharge date: 10/25/2014  Time spent: 40 minutes  Recommendations for Outpatient Follow-up:  1. Follow up with GI clinic at [facility]  Discharge Diagnoses:  Principal Problem:   Lower GI bleeding Active Problems:   Hypertension   Diabetes mellitus   Seizure disorder   CKD (chronic kidney disease) stage 4, GFR 15-29 ml/min   Dementia   CHF (congestive heart failure)   Acute blood loss anemia   Swelling of both ankles   CKD (chronic kidney disease)   Discharge Condition: improved  Diet recommendation: low salt, low carb  Filed Weights   10/23/14 0500 10/24/14 0500 10/25/14 0558  Weight: 73.9 kg (162 lb 14.7 oz) 78.5 kg (173 lb 1 oz) 78.835 kg (173 lb 12.8 oz)    History of present illness:  This patient was admitted to the hospital with bright red blood per rectum on the morning of admission. She has a chronic history of GI bleeding and has had several admissions for the same. Hemoglobin was 10.6 on admission and she was found to have heme-positive stools. She was admitted for further evaluation.  Hospital Course:  Patient was monitored in the hospital and was followed by gastroenterology. She was started on Anusol for possible hemorrhoidal component. Her bleeding spontaneously resolved and her hemoglobin remained stable during her hospital stay. She did not require transfusion of PRBCs. Her diet was advanced and she did not have any recurrent bleeding. It was recommended that she follow-up with her outpatient physicians at [facility]. She is otherwise felt stable for discharge per GI. The remainder of her medical issues remained stable.  Procedures:    Consultations:  gastroenterology  Discharge Exam: Filed Vitals:   10/25/14 0558  BP: 140/82  Pulse: 95  Temp: 97.4 F (36.3 C)  Resp: 18     General: NAD Cardiovascular: S1, S2 RRR Respiratory: CTA B  Discharge Instructions You were cared for by a hospitalist during your hospital stay. If you have any questions about your discharge medications or the care you received while you were in the hospital after you are discharged, you can call the unit and asked to speak with the hospitalist on call if the hospitalist that took care of you is not available. Once you are discharged, your primary care physician will handle any further medical issues. Please note that NO REFILLS for any discharge medications will be authorized once you are discharged, as it is imperative that you return to your primary care physician (or establish a relationship with a primary care physician if you do not have one) for your aftercare needs so that they can reassess your need for medications and monitor your lab values.  Discharge Instructions    Diet - low sodium heart healthy    Complete by:  As directed      Diet Carb Modified    Complete by:  As directed      Increase activity slowly    Complete by:  As directed           Current Discharge Medication List    START taking these medications   Details  hydrocortisone (ANUSOL-HC) 25 MG suppository Place 1 suppository (25 mg total) rectally 2 (two) times daily. Qty: 12 suppository, Refills: 0      CONTINUE these medications which have NOT CHANGED   Details  calcitRIOL (ROCALTROL) 0.25 MCG  capsule Take 1 capsule (0.25 mcg total) by mouth daily. Qty: 31 capsule, Refills: 0    donepezil (ARICEPT) 10 MG tablet Take 10 mg by mouth at bedtime.     folic acid (FOLVITE) 1 MG tablet Take 1 mg by mouth every morning.     hydrALAZINE (APRESOLINE) 25 MG tablet Take 75 mg by mouth every 8 (eight) hours.    insulin detemir (LEVEMIR) 100 UNIT/ML injection Inject 0.08 mLs (8 Units total) into the skin at bedtime. Qty: 10 mL, Refills: 0    isosorbide mononitrate (IMDUR) 30 MG 24 hr tablet Take 0.5 mg by  mouth every morning.    levETIRAcetam (KEPPRA) 100 MG/ML solution Take 5 mLs by mouth 2 (two) times daily.    linagliptin (TRADJENTA) 5 MG TABS tablet Take 5 mg by mouth every morning.     metoprolol succinate (TOPROL-XL) 100 MG 24 hr tablet Take 50 mg by mouth every morning. Take with or immediately following a meal.    pantoprazole (PROTONIX) 40 MG tablet Take 40 mg by mouth every morning.    potassium chloride (K-DUR) 10 MEQ tablet Take 10 mEq by mouth daily. Takes with torsemide    pravastatin (PRAVACHOL) 20 MG tablet Take 20 mg by mouth at bedtime.     sodium bicarbonate 650 MG tablet Take 650 mg by mouth 3 (three) times daily.    torsemide (DEMADEX) 100 MG tablet Take 50 mg by mouth daily.        No Known Allergies Follow-up Information    Follow up with [facility].   Contact information:   [address] [place] [address] [phone]       Follow up with follow up with GI clinic at [facility].       The results of significant diagnostics from this hospitalization (including imaging, microbiology, ancillary and laboratory) are listed below for reference.    Significant Diagnostic Studies: Dg Chest 2 View  10/07/2014   CLINICAL DATA:  Shortness of breath.  EXAM: CHEST  2 VIEW  COMPARISON:  06/09/2014 and prior radiograph  FINDINGS: Cardiomegaly, pulmonary vascular congestion and question of mild interstitial edema noted.  Cardiac surgical changes again noted.  Elevation of the right hemidiaphragm is present.  There is no evidence of pleural effusion or pneumothorax.  IMPRESSION: Cardiomegaly with pulmonary vascular congestion and question mild interstitial edema.   Electronically Signed   By: Hassan Rowan M.D.   On: 10/07/2014 18:24   Dg Chest Portable 1 View  10/22/2014   CLINICAL DATA:  Rectal bleeding  EXAM: PORTABLE CHEST - 1 VIEW  COMPARISON:  10/07/2014  FINDINGS: Lungs are essentially clear. No focal consolidation. No pleural  effusion or pneumothorax.  Cardiomegaly.  Postsurgical changes related to prior CABG.  IMPRESSION: No evidence of acute cardiopulmonary disease.   Electronically Signed   By: Julian Hy M.D.   On: 10/22/2014 11:29    Microbiology: Recent Results (from the past 240 hour(s))  MRSA PCR Screening     Status: None   Collection Time: 10/23/14  1:00 AM  Result Value Ref Range Status   MRSA by PCR NEGATIVE NEGATIVE Final    Comment:        The GeneXpert MRSA Assay (FDA approved for NASAL specimens only), is one component of a comprehensive MRSA colonization surveillance program. It is not intended to diagnose MRSA infection nor to guide or monitor treatment for MRSA infections.      Labs: Basic Metabolic Panel:  Recent Labs Lab 10/22/14 1102 10/23/14 0034 10/23/14 0508 10/24/14 0830  NA 143  --  146 141  K 3.6*  --  3.0* 4.3  CL 105  --  108 109  CO2 24  --  25 19  GLUCOSE 201*  --  140* 171*  BUN 68*  --  62* 52*  CREATININE 2.89*  --  2.70* 2.48*  CALCIUM 8.5  --  8.0* 7.9*  MG  --  2.0  --   --    Liver Function Tests:  Recent Labs Lab 10/23/14 0508  AST 27  ALT 19  ALKPHOS 88  BILITOT 0.4  PROT 7.5  ALBUMIN 2.1*   No results for input(s): LIPASE, AMYLASE in the last 168 hours. No results for input(s): AMMONIA in the last 168 hours. CBC:  Recent Labs Lab 10/22/14 1102  10/23/14 0852 10/23/14 1223 10/24/14 0404 10/24/14 1425 10/25/14 0426  WBC 5.6  < > 4.6 4.4 5.4 4.4 6.2  NEUTROABS 4.5  --   --   --   --   --   --   HGB 10.6*  < > 10.2* 10.0* 9.7* 9.6* 9.7*  HCT 34.4*  < > 34.2* 32.9* 32.6* 31.9* 32.0*  MCV 78.9  < > 79.5 78.7 79.5 79.6 79.6  PLT 339  < > 245 288 323 311 326  < > = values in this interval not displayed. Cardiac Enzymes: No results for input(s): CKTOTAL, CKMB, CKMBINDEX, TROPONINI in the last 168 hours. BNP: BNP (last 3 results)  Recent Labs  03/15/14 1645 04/27/14 1536 06/09/14 1021  PROBNP 37134.0* 51631.0* 30358.0*    CBG:  Recent Labs Lab 10/24/14 2013 10/25/14 0007 10/25/14 0434 10/25/14 0734 10/25/14 1122  GLUCAP 187* 182* 148* 109* 137*       Signed:  [name],[name]  [facility] 10/25/2014, 1:31 PM

## 2014-11-02 ENCOUNTER — Other Ambulatory Visit (HOSPITAL_COMMUNITY): Payer: Self-pay

## 2014-11-02 DIAGNOSIS — D631 Anemia in chronic kidney disease: Secondary | ICD-10-CM

## 2014-11-02 DIAGNOSIS — N184 Chronic kidney disease, stage 4 (severe): Principal | ICD-10-CM

## 2014-11-06 ENCOUNTER — Encounter (HOSPITAL_COMMUNITY): Payer: Medicare Other | Attending: Hematology and Oncology

## 2014-11-06 ENCOUNTER — Encounter (HOSPITAL_BASED_OUTPATIENT_CLINIC_OR_DEPARTMENT_OTHER): Payer: Medicare Other

## 2014-11-06 DIAGNOSIS — R569 Unspecified convulsions: Secondary | ICD-10-CM | POA: Insufficient documentation

## 2014-11-06 DIAGNOSIS — E119 Type 2 diabetes mellitus without complications: Secondary | ICD-10-CM | POA: Diagnosis not present

## 2014-11-06 DIAGNOSIS — D631 Anemia in chronic kidney disease: Secondary | ICD-10-CM | POA: Diagnosis not present

## 2014-11-06 DIAGNOSIS — E780 Pure hypercholesterolemia: Secondary | ICD-10-CM | POA: Diagnosis not present

## 2014-11-06 DIAGNOSIS — Z951 Presence of aortocoronary bypass graft: Secondary | ICD-10-CM | POA: Insufficient documentation

## 2014-11-06 DIAGNOSIS — Z87898 Personal history of other specified conditions: Secondary | ICD-10-CM | POA: Diagnosis not present

## 2014-11-06 DIAGNOSIS — Z794 Long term (current) use of insulin: Secondary | ICD-10-CM | POA: Diagnosis not present

## 2014-11-06 DIAGNOSIS — F0390 Unspecified dementia without behavioral disturbance: Secondary | ICD-10-CM | POA: Insufficient documentation

## 2014-11-06 DIAGNOSIS — Z8673 Personal history of transient ischemic attack (TIA), and cerebral infarction without residual deficits: Secondary | ICD-10-CM | POA: Insufficient documentation

## 2014-11-06 DIAGNOSIS — Z79899 Other long term (current) drug therapy: Secondary | ICD-10-CM | POA: Diagnosis not present

## 2014-11-06 DIAGNOSIS — I129 Hypertensive chronic kidney disease with stage 1 through stage 4 chronic kidney disease, or unspecified chronic kidney disease: Secondary | ICD-10-CM | POA: Insufficient documentation

## 2014-11-06 DIAGNOSIS — I252 Old myocardial infarction: Secondary | ICD-10-CM | POA: Insufficient documentation

## 2014-11-06 DIAGNOSIS — I2510 Atherosclerotic heart disease of native coronary artery without angina pectoris: Secondary | ICD-10-CM | POA: Diagnosis not present

## 2014-11-06 DIAGNOSIS — N184 Chronic kidney disease, stage 4 (severe): Secondary | ICD-10-CM | POA: Diagnosis not present

## 2014-11-06 DIAGNOSIS — Z87891 Personal history of nicotine dependence: Secondary | ICD-10-CM | POA: Diagnosis not present

## 2014-11-06 DIAGNOSIS — D649 Anemia, unspecified: Secondary | ICD-10-CM | POA: Insufficient documentation

## 2014-11-06 DIAGNOSIS — I509 Heart failure, unspecified: Secondary | ICD-10-CM | POA: Insufficient documentation

## 2014-11-06 LAB — CBC
HCT: 30.5 % — ABNORMAL LOW (ref 36.0–46.0)
HEMOGLOBIN: 9 g/dL — AB (ref 12.0–15.0)
MCH: 23 pg — ABNORMAL LOW (ref 26.0–34.0)
MCHC: 29.5 g/dL — ABNORMAL LOW (ref 30.0–36.0)
MCV: 78 fL (ref 78.0–100.0)
Platelets: 236 10*3/uL (ref 150–400)
RBC: 3.91 MIL/uL (ref 3.87–5.11)
RDW: 21 % — ABNORMAL HIGH (ref 11.5–15.5)
WBC: 6 10*3/uL (ref 4.0–10.5)

## 2014-11-06 MED ORDER — DARBEPOETIN ALFA 500 MCG/ML IJ SOSY
PREFILLED_SYRINGE | INTRAMUSCULAR | Status: AC
  Filled 2014-11-06: qty 1

## 2014-11-06 MED ORDER — DARBEPOETIN ALFA 500 MCG/ML IJ SOSY
500.0000 ug | PREFILLED_SYRINGE | Freq: Once | INTRAMUSCULAR | Status: AC
  Administered 2014-11-06: 500 ug via SUBCUTANEOUS

## 2014-11-06 NOTE — Progress Notes (Signed)
Labs for cbc 

## 2014-11-06 NOTE — Progress Notes (Signed)
[name] presents today for injection per MD orders. Aranesp 500 mcg administered SQ in left Abdomen. Administration without incident. Patient tolerated well.

## 2014-11-06 NOTE — Patient Instructions (Signed)
[facility] Discharge Instructions  RECOMMENDATIONS MADE BY THE CONSULTANT AND ANY TEST RESULTS WILL BE SENT TO YOUR REFERRING PHYSICIAN.  MEDICATIONS PRESCRIBED:  Aranesp 500 mcg injection today.  INSTRUCTIONS/FOLLOW-UP: Continue lab work every 3 weeks and Aranesp injection if hemoglobin less than 11. Return as scheduled.  Thank you for choosing [facility] to provide your oncology and hematology care.  To afford each patient quality time with our providers, please arrive at least 15 minutes before your scheduled appointment time.  With your help, our goal is to use those 15 minutes to complete the necessary work-up to ensure our physicians have the information they need to help with your evaluation and healthcare recommendations.    Effective January 1st, 2014, we ask that you re-schedule your appointment with our physicians should you arrive 10 or more minutes late for your appointment.  We strive to give you quality time with our providers, and arriving late affects you and other patients whose appointments are after yours.    Again, thank you for choosing [facility].  Our hope is that these requests will decrease the amount of time that you wait before being seen by our physicians.       _____________________________________________________________  Should you have questions after your visit to [facility], please contact our office at [phone] between the hours of 8:30 a.m. and 4:30 p.m.  Voicemails left after 4:30 p.m. will not be returned until the following business day.  For prescription refill requests, have your pharmacy contact our office with your prescription refill request.    _______________________________________________________________  We hope that we have given you very good care.  You may receive a patient satisfaction survey in the mail, please complete it and return it as soon as possible.  We value  your feedback!  _______________________________________________________________  Have you asked about our STAR program?  STAR stands for Survivorship Training and Rehabilitation, and this is a nationally recognized cancer care program that focuses on survivorship and rehabilitation.  Cancer and cancer treatments may cause problems, such as, pain, making you feel tired and keeping you from doing the things that you need or want to do. Cancer rehabilitation can help. Our goal is to reduce these troubling effects and help you have the best quality of life possible.  You may receive a survey from a nurse that asks questions about your current state of health.  Based on the survey results, all eligible patients will be referred to the [facility] program for an evaluation so we can better serve you!  A frequently asked questions sheet is available upon request.

## 2014-11-18 IMAGING — CT CT CHEST W/O CM
2 of 3 series · 15 of 36 positions shown, 18 images · non-contrast
Comparison: DG CHEST 2 VIEW dated 03/15/2014

CLINICAL DATA: Pulmonary nodules.  Hyperglycemia.

EXAM:
CT CHEST WITHOUT CONTRAST
TECHNIQUE: Multidetector CT imaging of the chest was performed following the
standard protocol without IV contrast.

[Series 2: chestroutine 5.0 b40f · axial · 0.66mm/px · z∈[-388,-118]mm · 12 of 64 slices shown, 15 images]
[im 5/64  mediastinal]
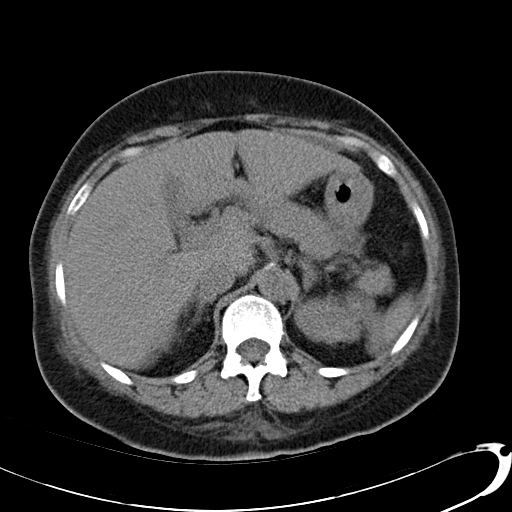
[im 5/64  lung]
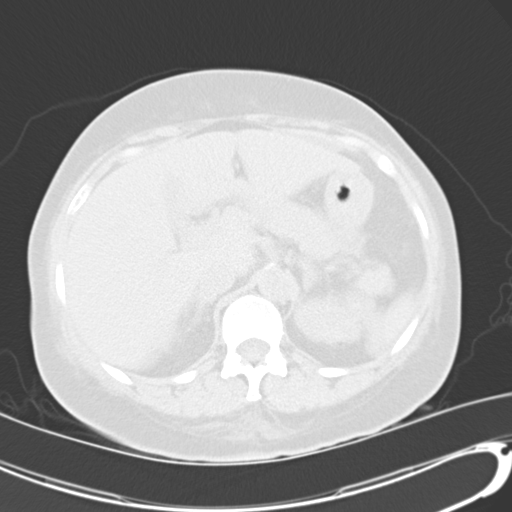
[im 10/64  lung]
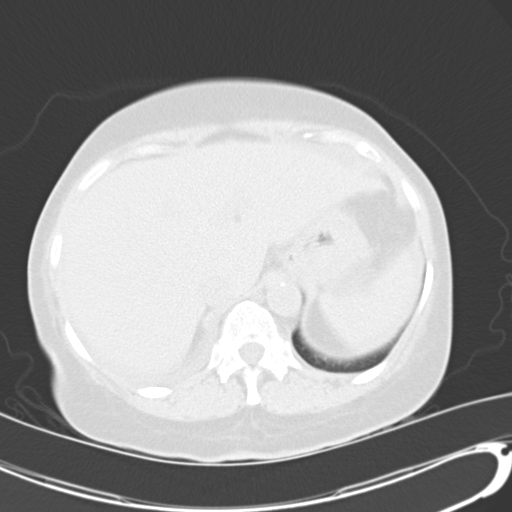
[im 15/64  lung]
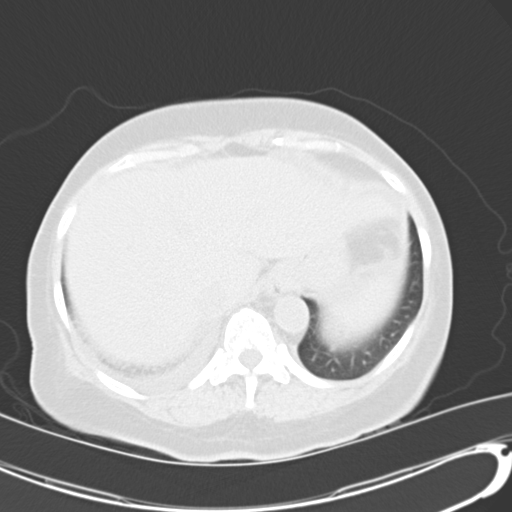
[im 19/64  lung]
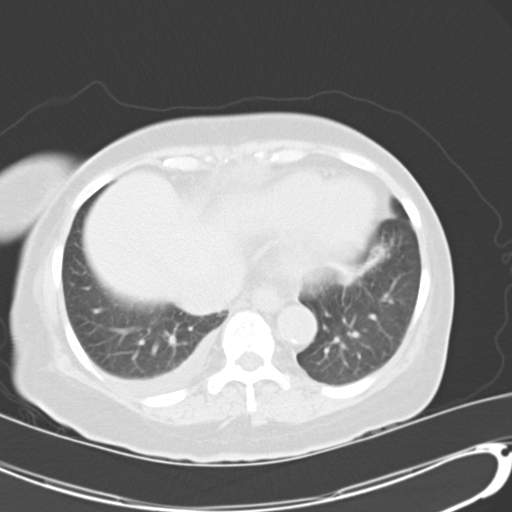
[im 24/64  mediastinal]
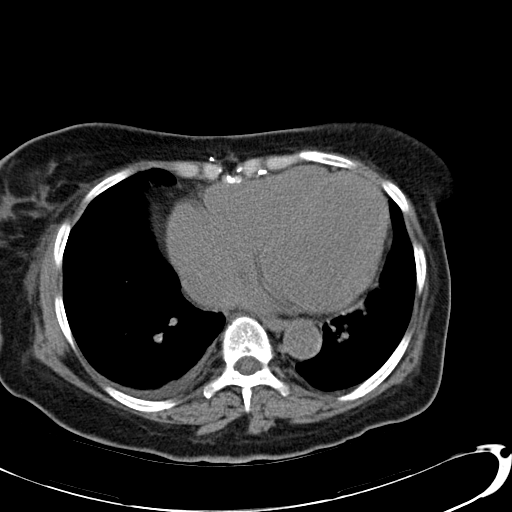
[im 24/64  lung]
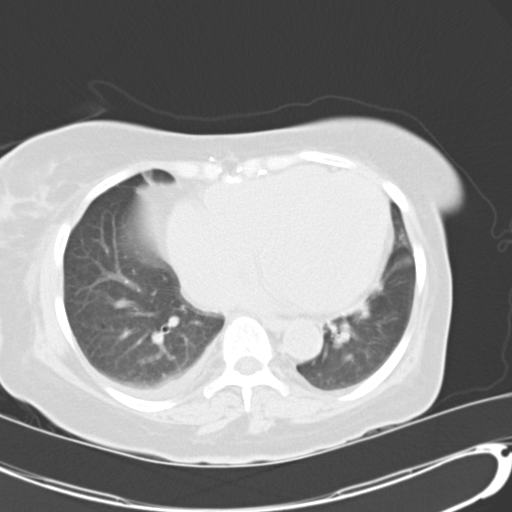
[im 29/64  lung]
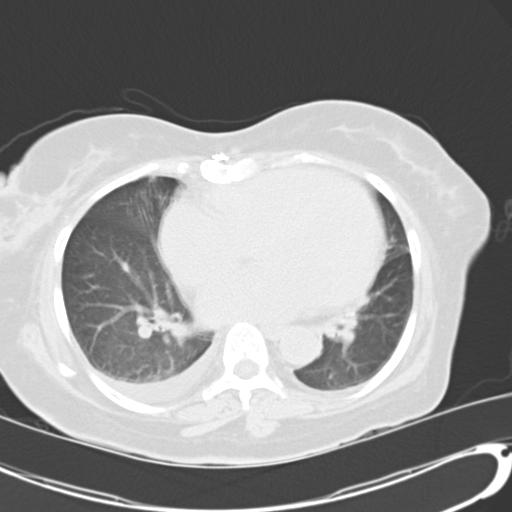
[im 36/64  lung]
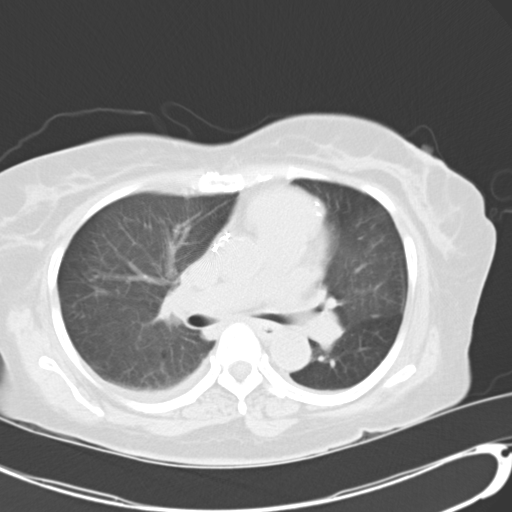
[im 40/64  lung]
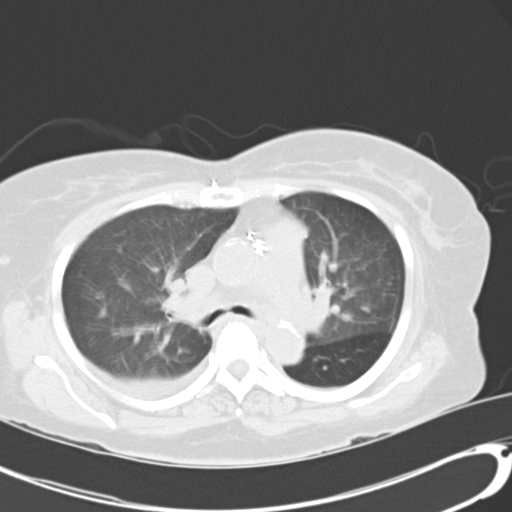
[im 45/64  mediastinal]
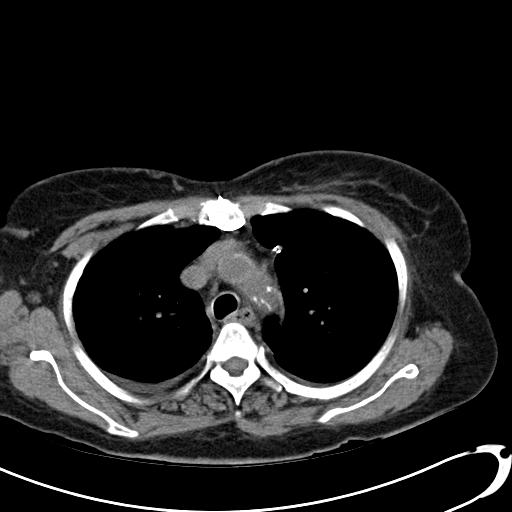
[im 45/64  lung]
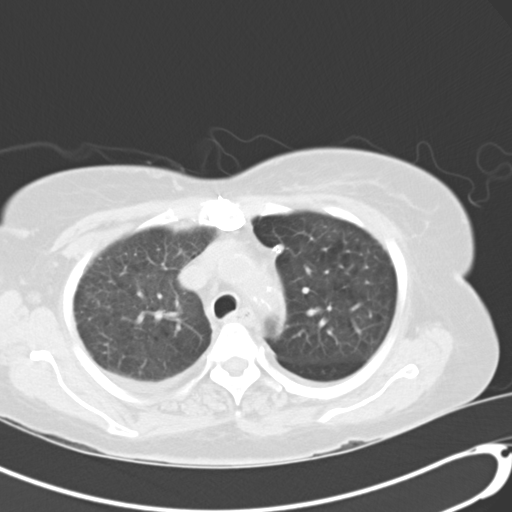
[im 50/64  lung]
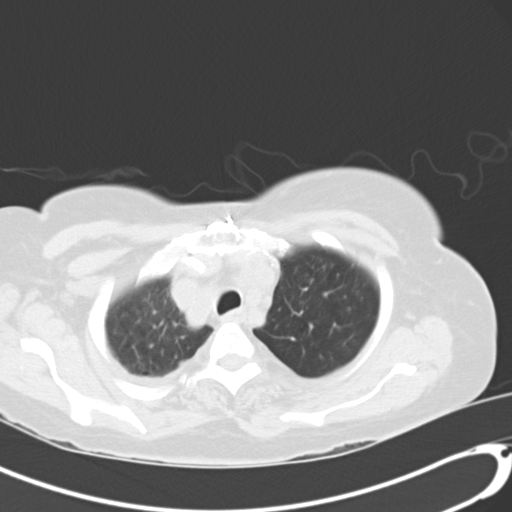
[im 54/64  lung]
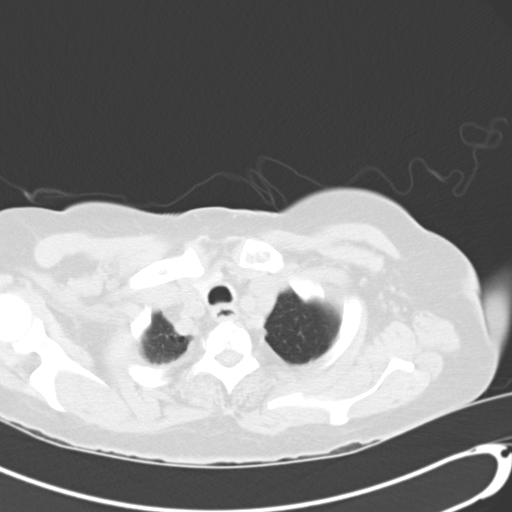
[im 59/64  lung]
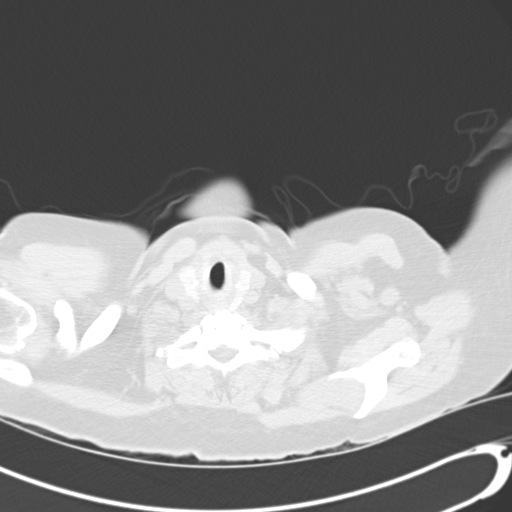

[Series 4: mpr coro 3mm · coronal · 0.56mm/px · 3 of 65 slices shown]
[im 13/65  lung]
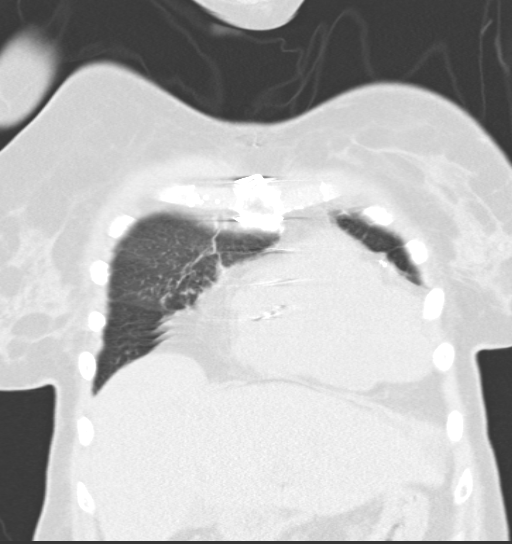
[im 26/65  lung]
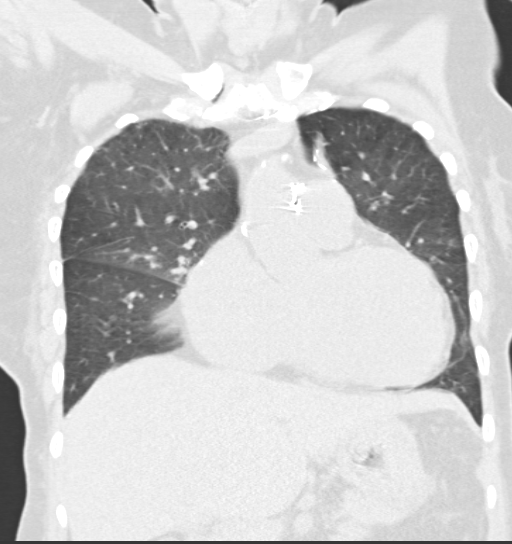
[im 39/65  lung]
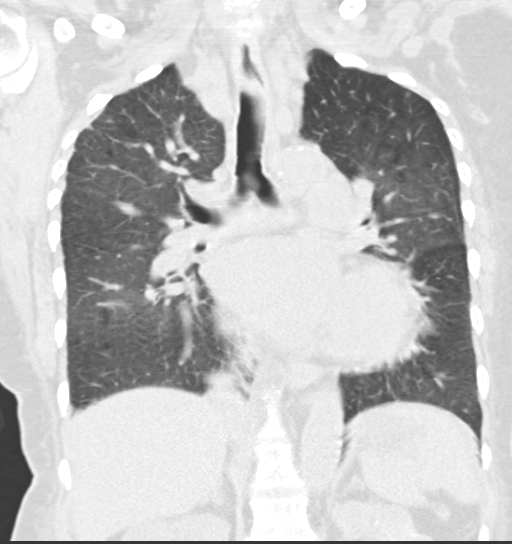

[15 of 36 positions shown; findings below may reference images not displayed]

FINDINGS: Median sternotomy/ CABG. Cardiomegaly is present. Small right
dependently layering and trace left pleural effusions are present.
Ground-glass attenuation is present in the upper lobes along with
interlobular septal thickening compatible with interstitial
pulmonary edema.

The nodular densities on the prior chest radiograph represent
peripheral pulmonary parenchymal areas of opacity extending to the
pleural surface at the right apex in the right upper lobe. These may
represent foci bronchopneumonia or alveolar edema. Underlying
pulmonary nodule cannot be excluded.

Incidental imaging of the upper abdomen demonstrates a partially
visualized rounded lesion in the interpolar left kidney measuring
about 2 cm. Renal mass cannot be excluded. There are also left renal
collecting system calculi. A followup renal MRI should be considered
for further assessment. Renal ultrasound could also be considered
however MRI would be a better definitive test. Non-emergent MRI
should be deferred until patient has been discharged for the acute
illness, and can optimally cooperate with positioning and
breath-holding instructions.

There is no axillary adenopathy. Aortic atherosclerosis. No
mediastinal adenopathy.
IMPRESSION: 1. Constellation of findings compatible with mild CHF with small
right-greater-than-left bilateral pleural effusions and
cardiomegaly.
2. Nodular densities at the right apex on prior plain film may
represent alveolar edema, foci of bronchopneumonia, pulmonary
parenchymal scarring or pulmonary nodules. Radiographic followup is
recommended to assess for clearing after the acute illness.
3. Partially visualized possible left renal mass and left renal
calculi. See discussion above. MRI is preferred over renal
ultrasound for definitive assessment. Non-emergent MRI should be
deferred until patient has been discharged for the acute illness,
and can optimally cooperate with positioning and breath-holding
instructions.

## 2014-11-18 IMAGING — CR DG CHEST 2V
2 series · 2 of 2 positions shown · non-contrast
Comparison: February 21, 2014

CLINICAL DATA: Weakness

EXAM:
CHEST  2 VIEW

[view not recorded (1 of 2)]
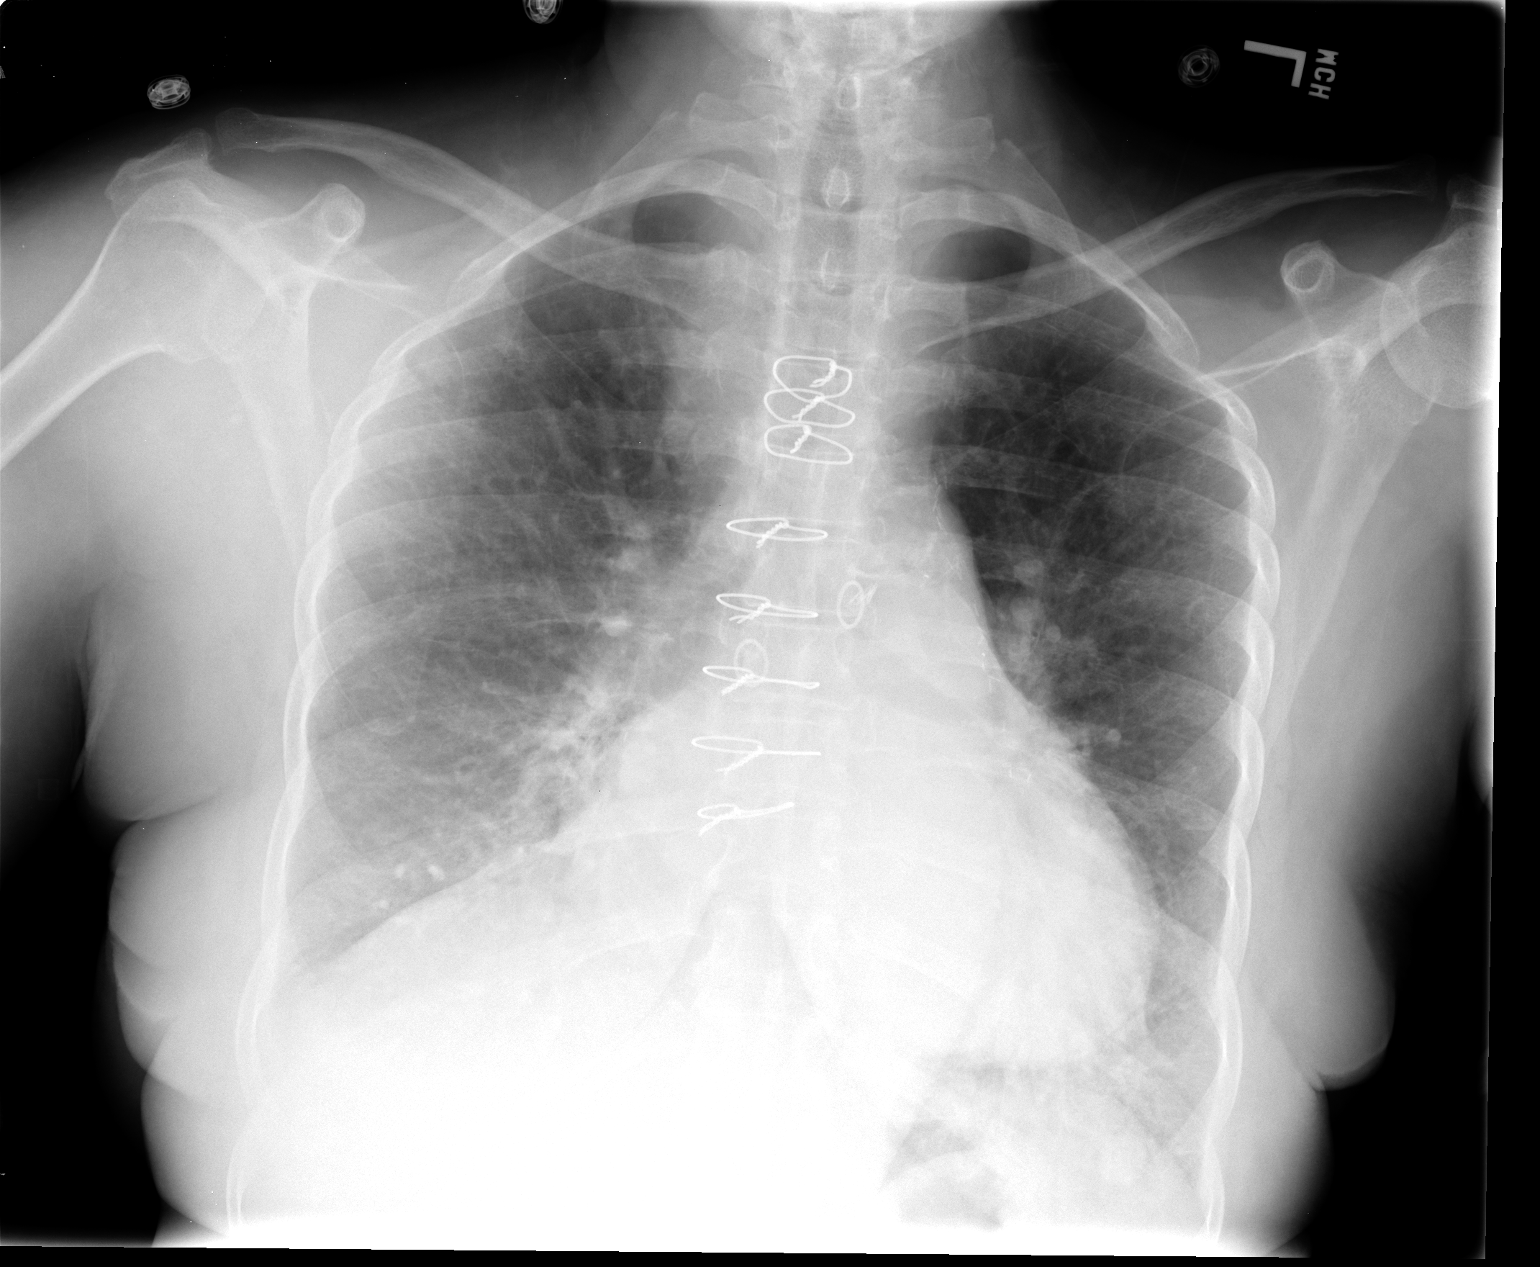

[view not recorded (2 of 2)]
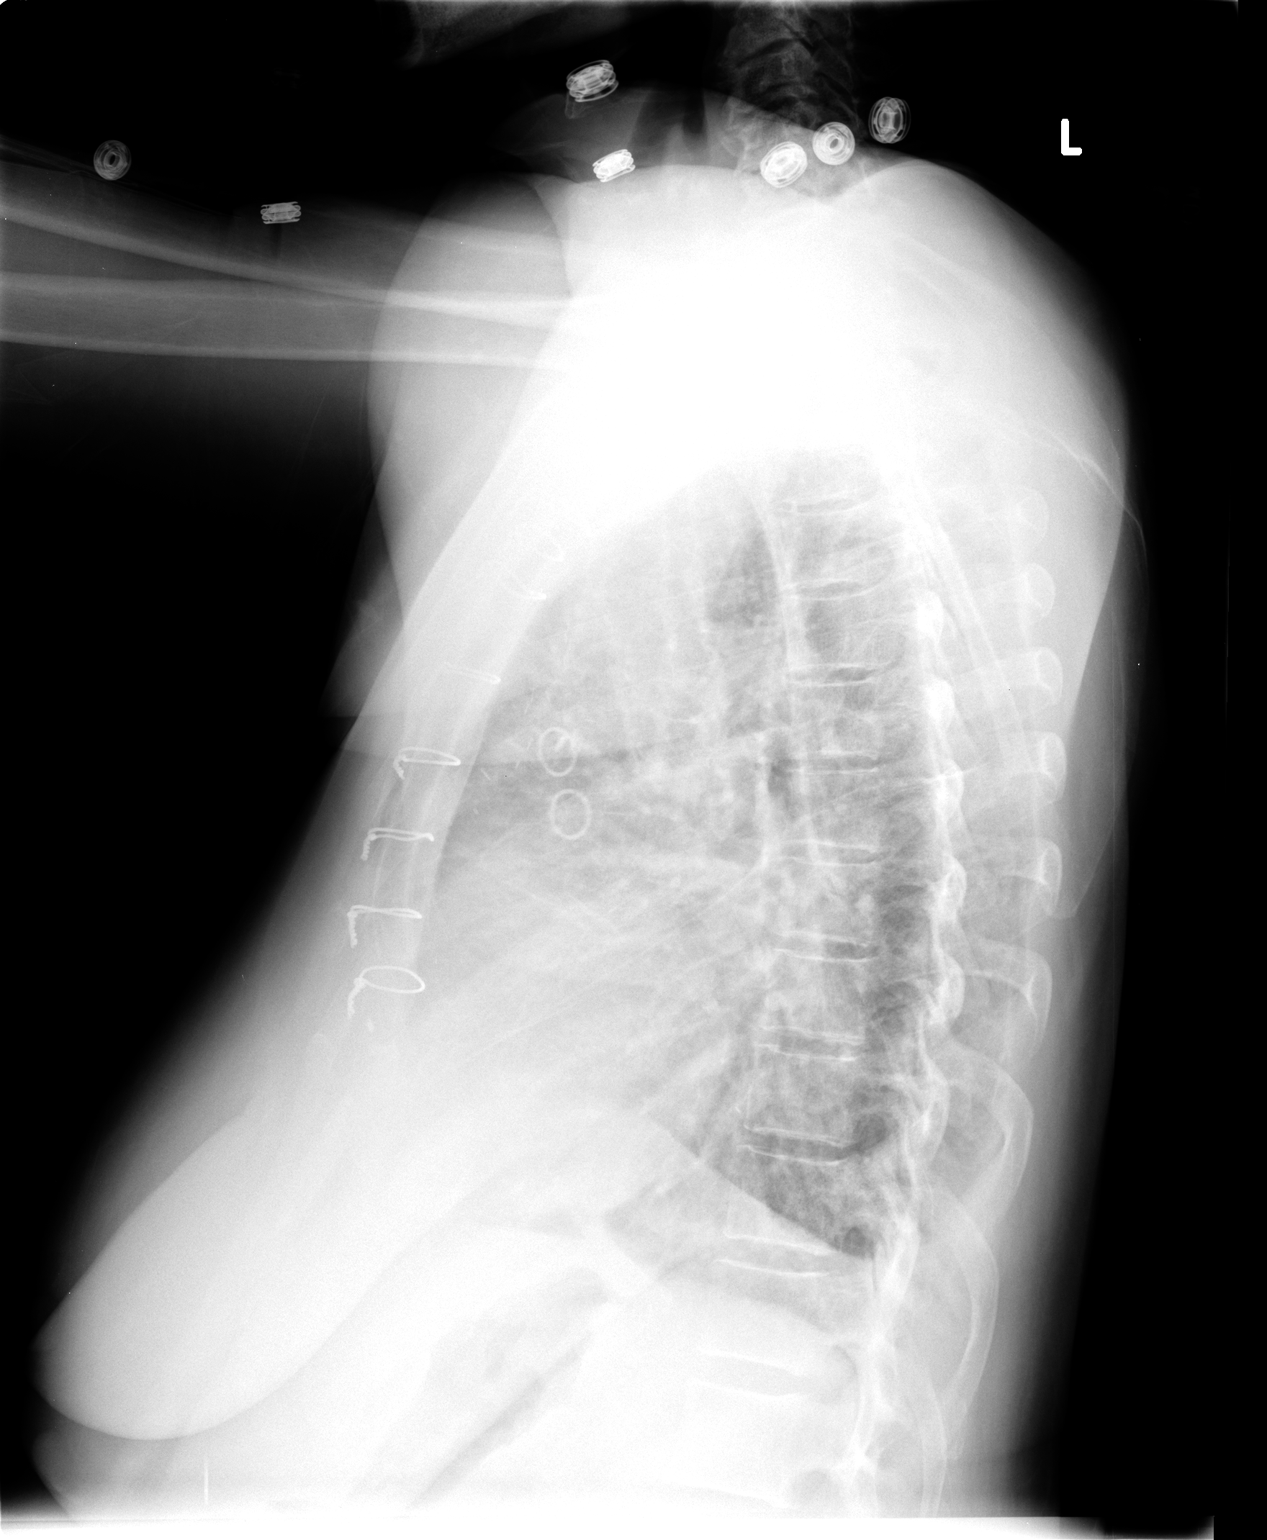

[2 of 2 positions shown; findings below may reference images not displayed]

FINDINGS: There is a 7 x 7 mm nodular opacity in the right upper lobe, not
present previously. There is a nearby opacity measuring 1.3 x 1.0 cm
more peripherally in the right upper lobe. No other
similar-appearing structures are seen. There is trace interstitial
edema with cardiomegaly and pulmonary venous hypertension. There is
no airspace consolidation. Patient is status post coronary artery
bypass grafting. There is no appreciable adenopathy. No bone
lesions.
IMPRESSION: 7 x 7 mm nodular opacity right upper lobe. There is also a 1.3 x
cm right upper lobe opacity. These findings were not seen on
previous study. Advise correlation with noncontrast enhanced chest
CT to further evaluate these apparent nodules.

There is evidence suggesting a degree of congestive heart failure.
No airspace consolidation.

## 2014-11-18 IMAGING — CT CT HEAD W/O CM
1 series · 16 of 30 positions shown, 20 images · non-contrast
Comparison: CT HEAD W/O CM dated 01/16/2014

CLINICAL DATA: slurred speech slurred speech

EXAM:
CT HEAD WITHOUT CONTRAST
TECHNIQUE: Contiguous axial images were obtained from the base of the skull
through the vertex without intravenous contrast.

[Series 2: headseq 4.8 h37s · axial · 0.43mm/px · z∈[+1248,+1401]mm · 16 of 36 slices shown, 20 images]
[im 2/36  brain]
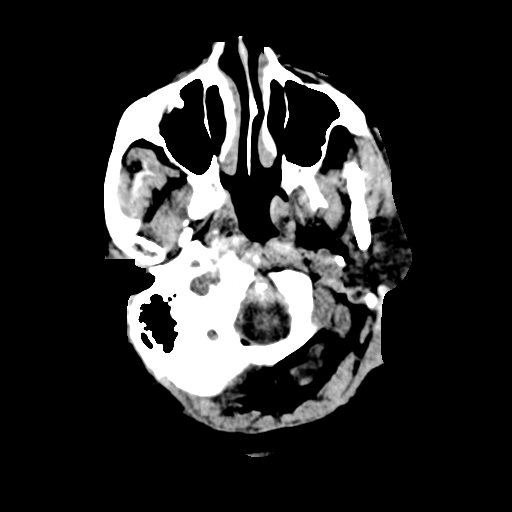
[im 2/36  bone]
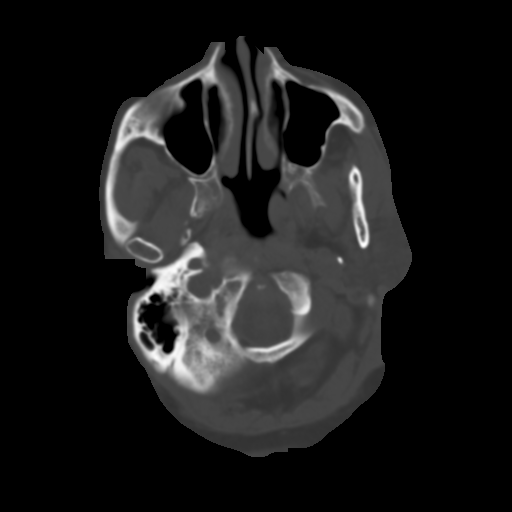
[im 4/36  brain]
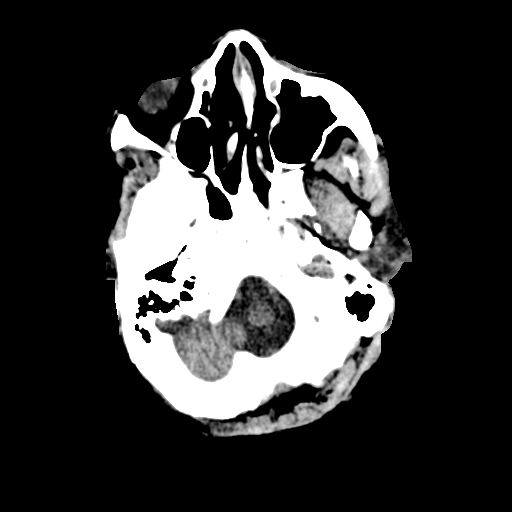
[im 7/36  brain]
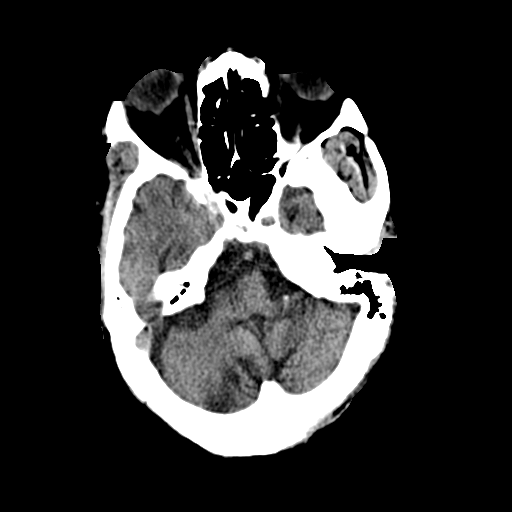
[im 9/36  brain]
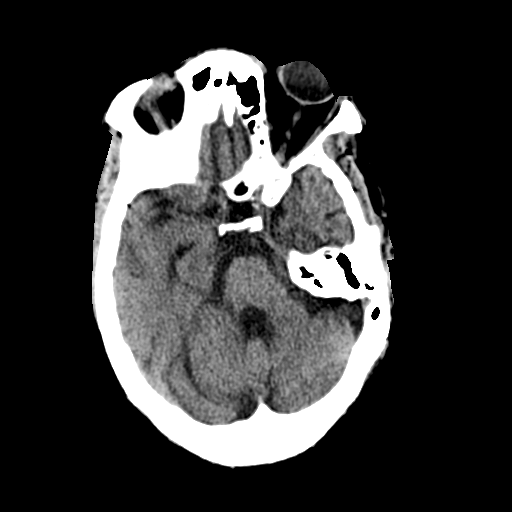
[im 10/36  brain]
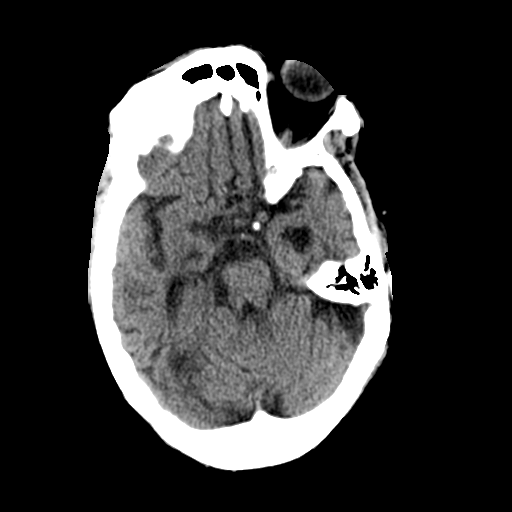
[im 10/36  bone]
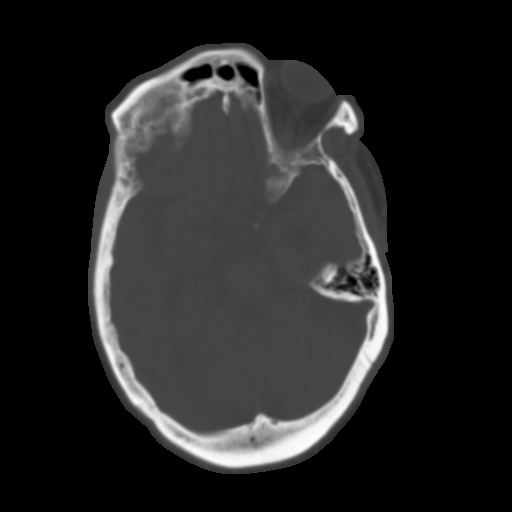
[im 13/36  brain]
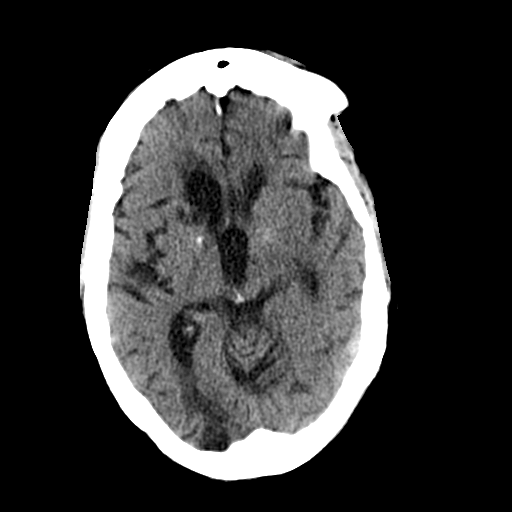
[im 15/36  brain]
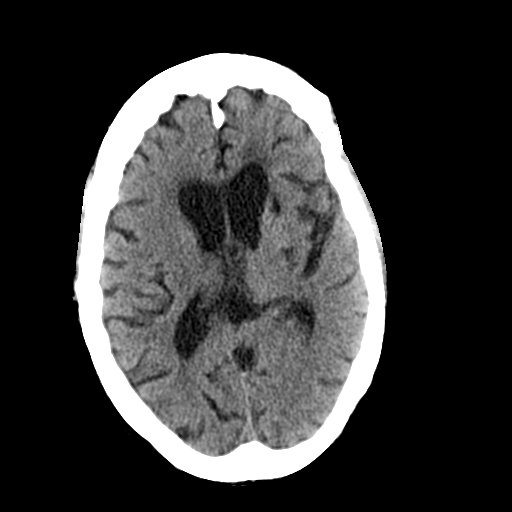
[im 17/36  brain]
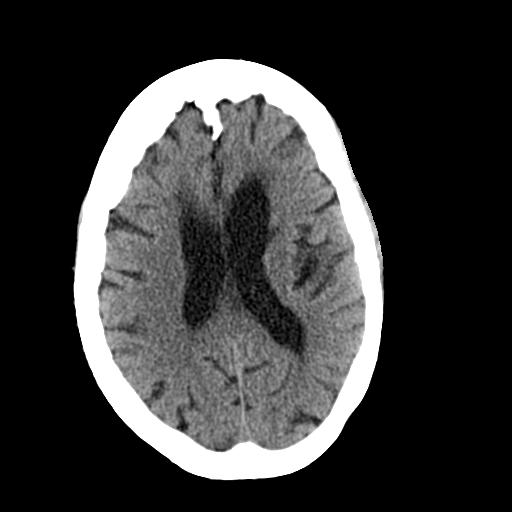
[im 19/36  brain]
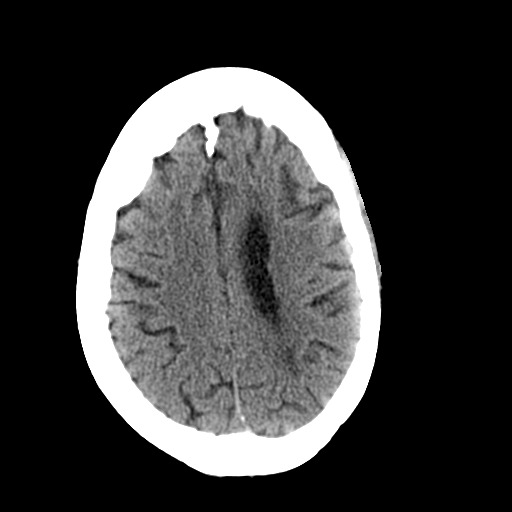
[im 19/36  bone]
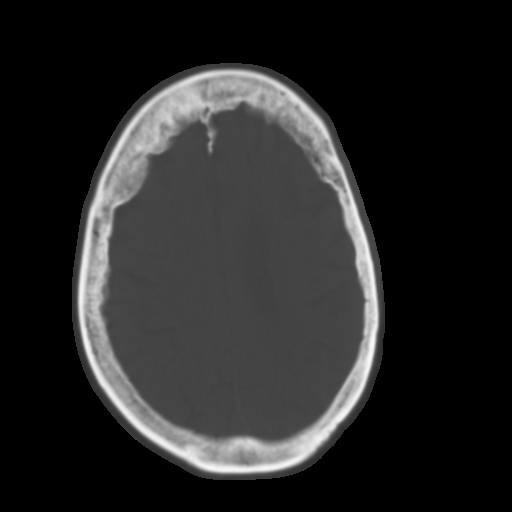
[im 21/36  brain]
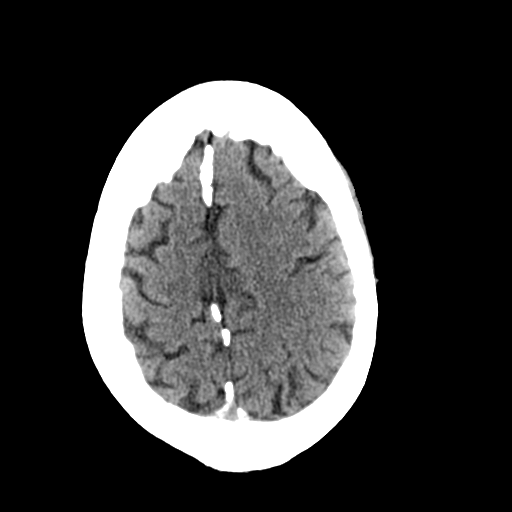
[im 23/36  brain]
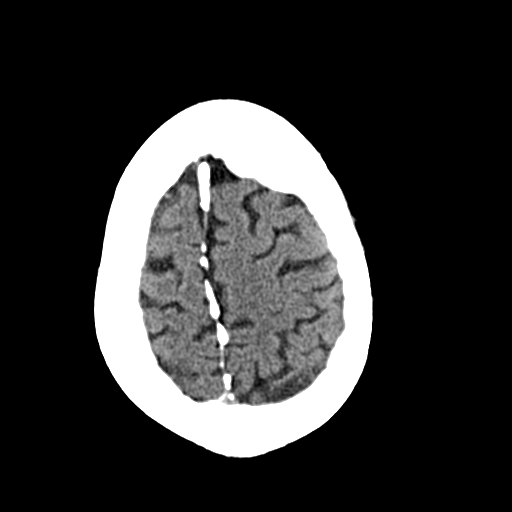
[im 26/36  brain]
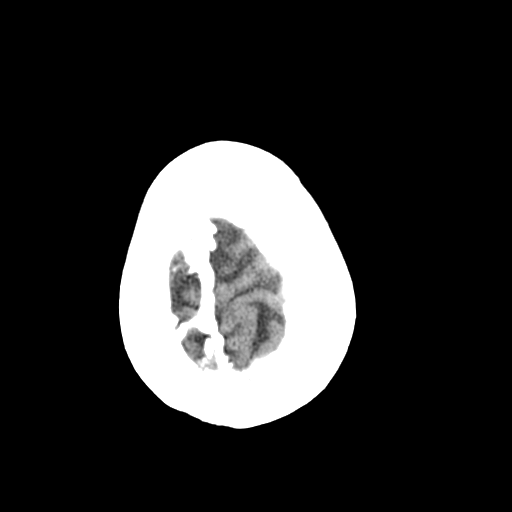
[im 27/36  brain]
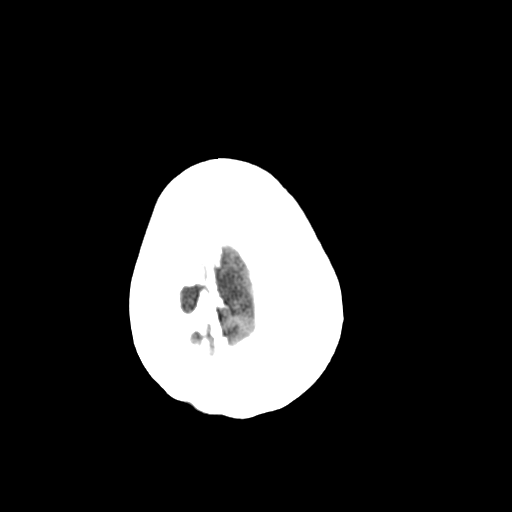
[im 27/36  bone]
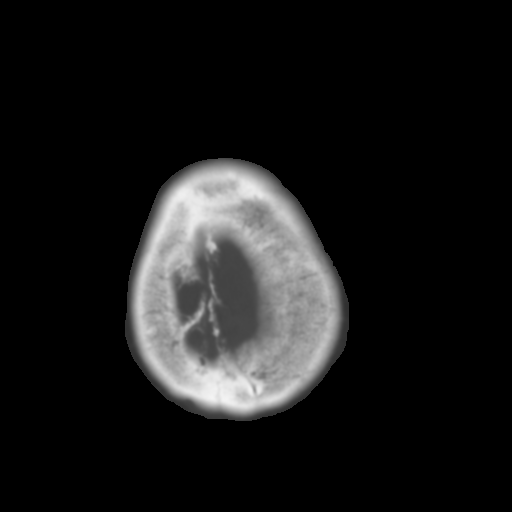
[im 29/36  brain]
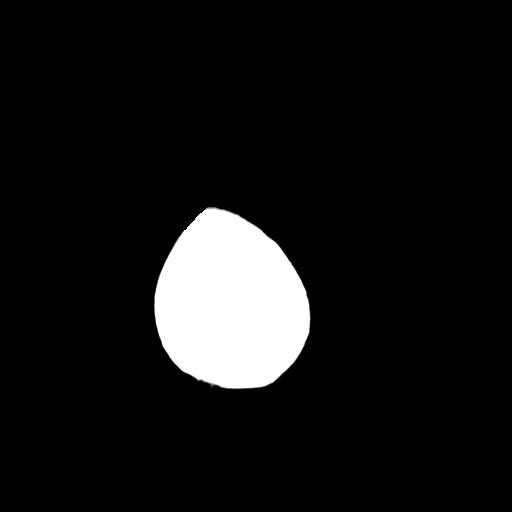
[im 32/36  brain]
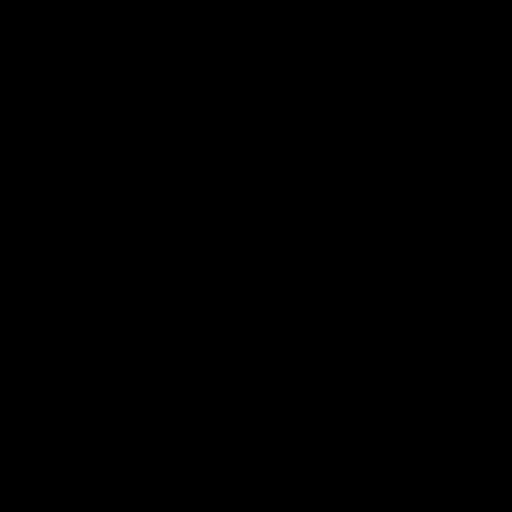
[im 34/36  brain]
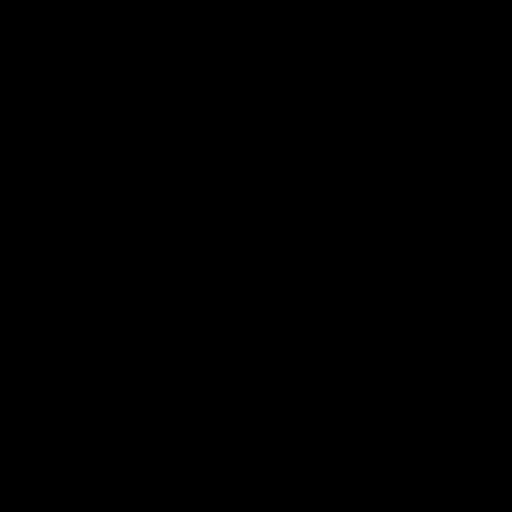

[16 of 30 positions shown; findings below may reference images not displayed]

FINDINGS: No acute intracranial abnormality. Specifically, no hemorrhage,
hydrocephalus, mass lesion, acute infarction, or significant
intracranial injury. No acute calvarial abnormality. Age-appropriate
global atrophy. Areas of low-attenuation are appreciated within the
subcortical and periventricular white matter regions. Regions of
chronic lacunar infarction left basal ganglia . An area of chronic
PCA distribution infarction on the right. Basal ganglial
calcifications are appreciated. The visualized paranasal sinuses and
mastoid air cells are patent.
IMPRESSION: Involutional and chronic changes without acute abnormalities.

## 2014-11-27 ENCOUNTER — Encounter (HOSPITAL_COMMUNITY): Payer: Medicare Other | Attending: Hematology & Oncology

## 2014-11-27 ENCOUNTER — Encounter (HOSPITAL_COMMUNITY): Payer: Self-pay

## 2014-11-27 ENCOUNTER — Encounter (HOSPITAL_COMMUNITY): Payer: Medicare Other | Attending: Hematology and Oncology

## 2014-11-27 VITALS — BP 137/66 | HR 51 | Resp 16

## 2014-11-27 DIAGNOSIS — N184 Chronic kidney disease, stage 4 (severe): Secondary | ICD-10-CM | POA: Insufficient documentation

## 2014-11-27 DIAGNOSIS — Z87891 Personal history of nicotine dependence: Secondary | ICD-10-CM | POA: Insufficient documentation

## 2014-11-27 DIAGNOSIS — D631 Anemia in chronic kidney disease: Secondary | ICD-10-CM

## 2014-11-27 DIAGNOSIS — I2510 Atherosclerotic heart disease of native coronary artery without angina pectoris: Secondary | ICD-10-CM | POA: Diagnosis not present

## 2014-11-27 DIAGNOSIS — D649 Anemia, unspecified: Secondary | ICD-10-CM | POA: Insufficient documentation

## 2014-11-27 DIAGNOSIS — I509 Heart failure, unspecified: Secondary | ICD-10-CM | POA: Insufficient documentation

## 2014-11-27 DIAGNOSIS — Z794 Long term (current) use of insulin: Secondary | ICD-10-CM | POA: Insufficient documentation

## 2014-11-27 DIAGNOSIS — K625 Hemorrhage of anus and rectum: Secondary | ICD-10-CM

## 2014-11-27 DIAGNOSIS — D62 Acute posthemorrhagic anemia: Secondary | ICD-10-CM

## 2014-11-27 DIAGNOSIS — R569 Unspecified convulsions: Secondary | ICD-10-CM | POA: Diagnosis not present

## 2014-11-27 DIAGNOSIS — E780 Pure hypercholesterolemia: Secondary | ICD-10-CM | POA: Diagnosis not present

## 2014-11-27 DIAGNOSIS — Z87898 Personal history of other specified conditions: Secondary | ICD-10-CM | POA: Insufficient documentation

## 2014-11-27 DIAGNOSIS — D509 Iron deficiency anemia, unspecified: Secondary | ICD-10-CM

## 2014-11-27 DIAGNOSIS — I252 Old myocardial infarction: Secondary | ICD-10-CM | POA: Insufficient documentation

## 2014-11-27 DIAGNOSIS — Z79899 Other long term (current) drug therapy: Secondary | ICD-10-CM | POA: Insufficient documentation

## 2014-11-27 DIAGNOSIS — Z951 Presence of aortocoronary bypass graft: Secondary | ICD-10-CM | POA: Diagnosis not present

## 2014-11-27 DIAGNOSIS — I129 Hypertensive chronic kidney disease with stage 1 through stage 4 chronic kidney disease, or unspecified chronic kidney disease: Secondary | ICD-10-CM | POA: Insufficient documentation

## 2014-11-27 DIAGNOSIS — E119 Type 2 diabetes mellitus without complications: Secondary | ICD-10-CM | POA: Insufficient documentation

## 2014-11-27 DIAGNOSIS — F0390 Unspecified dementia without behavioral disturbance: Secondary | ICD-10-CM | POA: Diagnosis not present

## 2014-11-27 DIAGNOSIS — Z8673 Personal history of transient ischemic attack (TIA), and cerebral infarction without residual deficits: Secondary | ICD-10-CM | POA: Diagnosis not present

## 2014-11-27 DIAGNOSIS — N189 Chronic kidney disease, unspecified: Secondary | ICD-10-CM | POA: Insufficient documentation

## 2014-11-27 LAB — CBC
HCT: 26.6 % — ABNORMAL LOW (ref 36.0–46.0)
Hemoglobin: 7.8 g/dL — ABNORMAL LOW (ref 12.0–15.0)
MCH: 22.3 pg — ABNORMAL LOW (ref 26.0–34.0)
MCHC: 29.3 g/dL — ABNORMAL LOW (ref 30.0–36.0)
MCV: 76 fL — AB (ref 78.0–100.0)
PLATELETS: 216 10*3/uL (ref 150–400)
RBC: 3.5 MIL/uL — AB (ref 3.87–5.11)
RDW: 21.5 % — ABNORMAL HIGH (ref 11.5–15.5)
WBC: 3.5 10*3/uL — ABNORMAL LOW (ref 4.0–10.5)

## 2014-11-27 LAB — PREPARE RBC (CROSSMATCH)

## 2014-11-27 LAB — FERRITIN: FERRITIN: 34 ng/mL (ref 10–291)

## 2014-11-27 MED ORDER — DARBEPOETIN ALFA 100 MCG/0.5ML IJ SOSY
60.0000 ug | PREFILLED_SYRINGE | Freq: Once | INTRAMUSCULAR | Status: AC
  Administered 2014-11-27: 60 ug via SUBCUTANEOUS

## 2014-11-27 MED ORDER — DARBEPOETIN ALFA 60 MCG/0.3ML IJ SOSY
PREFILLED_SYRINGE | INTRAMUSCULAR | Status: AC
  Filled 2014-11-27: qty 0.3

## 2014-11-27 NOTE — Patient Instructions (Signed)
[facility] Discharge Instructions  RECOMMENDATIONS MADE BY THE CONSULTANT AND ANY TEST RESULTS WILL BE SENT TO YOUR REFERRING PHYSICIAN.  You received Aranesp today.  Return tomorrow for 2 units of blood  Thank you for choosing [facility] to provide your oncology and hematology care.  To afford each patient quality time with our providers, please arrive at least 15 minutes before your scheduled appointment time.  With your help, our goal is to use those 15 minutes to complete the necessary work-up to ensure our physicians have the information they need to help with your evaluation and healthcare recommendations.    Effective January 1st, 2014, we ask that you re-schedule your appointment with our physicians should you arrive 10 or more minutes late for your appointment.  We strive to give you quality time with our providers, and arriving late affects you and other patients whose appointments are after yours.    Again, thank you for choosing [facility].  Our hope is that these requests will decrease the amount of time that you wait before being seen by our physicians.       _____________________________________________________________  Should you have questions after your visit to [facility], please contact our office at [phone] between the hours of 8:30 a.m. and 5:00 p.m.  Voicemails left after 4:30 p.m. will not be returned until the following business day.  For prescription refill requests, have your pharmacy contact our office with your prescription refill request.

## 2014-11-27 NOTE — Progress Notes (Signed)
LABS FOR CBC,FERR,TYSCR

## 2014-11-27 NOTE — Progress Notes (Signed)
[name]'s reason for visit today is for an injection and labs as scheduled per MD orders.  Labs were drawn prior to administration of ordered medication.    [name] also received aranesp 60 mcg per MD orders; see [facility] for administration details.  [name] tolerated all procedures well and without incident; questions were answered and patient was discharged.

## 2014-11-28 ENCOUNTER — Other Ambulatory Visit (HOSPITAL_COMMUNITY): Payer: Self-pay | Admitting: Oncology

## 2014-11-28 ENCOUNTER — Encounter (HOSPITAL_BASED_OUTPATIENT_CLINIC_OR_DEPARTMENT_OTHER): Payer: Medicare Other

## 2014-11-28 ENCOUNTER — Encounter (HOSPITAL_COMMUNITY): Payer: Self-pay

## 2014-11-28 VITALS — BP 142/85 | HR 72 | Temp 97.7°F | Resp 20

## 2014-11-28 DIAGNOSIS — D62 Acute posthemorrhagic anemia: Secondary | ICD-10-CM

## 2014-11-28 DIAGNOSIS — D649 Anemia, unspecified: Secondary | ICD-10-CM | POA: Diagnosis not present

## 2014-11-28 DIAGNOSIS — K625 Hemorrhage of anus and rectum: Secondary | ICD-10-CM

## 2014-11-28 DIAGNOSIS — D509 Iron deficiency anemia, unspecified: Secondary | ICD-10-CM

## 2014-11-28 MED ORDER — FUROSEMIDE 10 MG/ML IJ SOLN
INTRAMUSCULAR | Status: AC
  Filled 2014-11-28: qty 2

## 2014-11-28 MED ORDER — DIPHENHYDRAMINE HCL 25 MG PO CAPS
ORAL_CAPSULE | ORAL | Status: AC
  Filled 2014-11-28: qty 1

## 2014-11-28 MED ORDER — ACETAMINOPHEN 325 MG PO TABS
650.0000 mg | ORAL_TABLET | Freq: Once | ORAL | Status: AC
  Administered 2014-11-28: 650 mg via ORAL

## 2014-11-28 MED ORDER — SODIUM CHLORIDE 0.9 % IV SOLN
250.0000 mL | Freq: Once | INTRAVENOUS | Status: AC
  Administered 2014-11-28: 250 mL via INTRAVENOUS

## 2014-11-28 MED ORDER — ACETAMINOPHEN 325 MG PO TABS
ORAL_TABLET | ORAL | Status: AC
  Filled 2014-11-28: qty 2

## 2014-11-28 MED ORDER — FUROSEMIDE 10 MG/ML IJ SOLN: 20.0000 mg | Freq: Once | INTRAMUSCULAR | Status: DC

## 2014-11-28 MED ORDER — FUROSEMIDE 10 MG/ML IJ SOLN
20.0000 mg | Freq: Once | INTRAMUSCULAR | Status: AC
  Administered 2014-11-28: 20 mg via INTRAVENOUS

## 2014-11-28 MED ORDER — SODIUM CHLORIDE 0.9 % IJ SOLN
10.0000 mL | INTRAMUSCULAR | Status: AC | PRN
  Administered 2014-11-28: 10 mL

## 2014-11-28 MED ORDER — DIPHENHYDRAMINE HCL 25 MG PO CAPS
25.0000 mg | ORAL_CAPSULE | Freq: Once | ORAL | Status: AC
Start: 2014-11-28 — End: 2014-11-28
  Administered 2014-11-28: 25 mg via ORAL

## 2014-11-28 NOTE — Progress Notes (Signed)
[name] Milian Tolerated two units of PRBCs today

## 2014-11-28 NOTE — Patient Instructions (Signed)
[facility] Discharge Instructions  RECOMMENDATIONS MADE BY THE CONSULTANT AND ANY TEST RESULTS WILL BE SENT TO YOUR REFERRING PHYSICIAN.  You received 2 units of blood today.  Please follow up as scheduled.  Please call the clinic if you have any questions or concerns  Thank you for choosing [facility] to provide your oncology and hematology care.  To afford each patient quality time with our providers, please arrive at least 15 minutes before your scheduled appointment time.  With your help, our goal is to use those 15 minutes to complete the necessary work-up to ensure our physicians have the information they need to help with your evaluation and healthcare recommendations.    Effective January 1st, 2014, we ask that you re-schedule your appointment with our physicians should you arrive 10 or more minutes late for your appointment.  We strive to give you quality time with our providers, and arriving late affects you and other patients whose appointments are after yours.    Again, thank you for choosing [facility].  Our hope is that these requests will decrease the amount of time that you wait before being seen by our physicians.       _____________________________________________________________  Should you have questions after your visit to [facility], please contact our office at [phone] between the hours of 8:30 a.m. and 5:00 p.m.  Voicemails left after 4:30 p.m. will not be returned until the following business day.  For prescription refill requests, have your pharmacy contact our office with your prescription refill request.     

## 2014-11-29 LAB — TYPE AND SCREEN
ABO/RH(D): A POS
ANTIBODY SCREEN: NEGATIVE
UNIT DIVISION: 0
UNIT DIVISION: 0

## 2014-12-07 ENCOUNTER — Encounter (HOSPITAL_COMMUNITY): Payer: Self-pay | Admitting: Internal Medicine

## 2014-12-11 ENCOUNTER — Encounter (HOSPITAL_COMMUNITY): Payer: Medicare Other

## 2014-12-11 ENCOUNTER — Encounter (HOSPITAL_BASED_OUTPATIENT_CLINIC_OR_DEPARTMENT_OTHER): Payer: Medicare Other

## 2014-12-11 DIAGNOSIS — D509 Iron deficiency anemia, unspecified: Secondary | ICD-10-CM

## 2014-12-11 DIAGNOSIS — D631 Anemia in chronic kidney disease: Secondary | ICD-10-CM

## 2014-12-11 DIAGNOSIS — N184 Chronic kidney disease, stage 4 (severe): Secondary | ICD-10-CM

## 2014-12-11 DIAGNOSIS — D649 Anemia, unspecified: Secondary | ICD-10-CM | POA: Diagnosis not present

## 2014-12-11 LAB — CBC
HEMATOCRIT: 39.1 % (ref 36.0–46.0)
Hemoglobin: 11 g/dL — ABNORMAL LOW (ref 12.0–15.0)
MCH: 23.8 pg — AB (ref 26.0–34.0)
MCHC: 28.1 g/dL — AB (ref 30.0–36.0)
MCV: 84.6 fL (ref 78.0–100.0)
Platelets: 182 10*3/uL (ref 150–400)
RBC: 4.62 MIL/uL (ref 3.87–5.11)
RDW: 26.7 % — ABNORMAL HIGH (ref 11.5–15.5)
WBC: 5.5 10*3/uL (ref 4.0–10.5)

## 2014-12-11 LAB — FERRITIN: FERRITIN: 62 ng/mL (ref 10–291)

## 2014-12-11 NOTE — Progress Notes (Signed)
LABS FOR FERR CBC

## 2014-12-11 NOTE — Progress Notes (Signed)
Hgb 11.  Aranesp injection held

## 2014-12-12 ENCOUNTER — Other Ambulatory Visit (HOSPITAL_COMMUNITY): Payer: Self-pay | Admitting: Oncology

## 2014-12-12 DIAGNOSIS — D509 Iron deficiency anemia, unspecified: Secondary | ICD-10-CM

## 2014-12-12 DIAGNOSIS — K625 Hemorrhage of anus and rectum: Secondary | ICD-10-CM

## 2014-12-18 ENCOUNTER — Ambulatory Visit (HOSPITAL_COMMUNITY): Payer: Medicare Other | Admitting: Hematology & Oncology

## 2014-12-18 ENCOUNTER — Ambulatory Visit (HOSPITAL_COMMUNITY): Payer: Medicare Other

## 2014-12-18 ENCOUNTER — Encounter (HOSPITAL_COMMUNITY): Payer: Medicare Other

## 2014-12-18 ENCOUNTER — Other Ambulatory Visit (HOSPITAL_COMMUNITY): Payer: Medicare Other

## 2014-12-18 NOTE — Progress Notes (Deleted)
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    CURRENT THERAPY:  INTERVAL HISTORY: [name] 74 y.o. female returns for   MEDICAL HISTORY: Past Medical History  Diagnosis Date  . Type 2 diabetes mellitus   . Essential hypertension, benign   . History of stroke     Previously on Coumadin  . MI, old     Reported 69  . Seizure disorder   . Coronary atherosclerosis of native coronary artery     Multivessel status post CABG 2002  . History of GI bleed     Erosive gastritis and duodenitis by EGD 2002  . Mixed hyperlipidemia   . Ischemic cardiomyopathy     LVEF 40-45% March 2013  . Dementia   . Stroke   . Seizures   . CKD (chronic kidney disease) stage 4, GFR 15-29 ml/min   . CHF (congestive heart failure)     has Acute CVA Left frontal lobe,; Hypertension; Diabetes mellitus; Seizure disorder; ARF (acute renal failure); Neutropenia; CVA (cerebral infarction); Hyperglycemia; CKD (chronic kidney disease) stage 4, GFR 15-29 ml/min; Diabetes; Dehydration; Bilateral lower extremity edema; GI bleeding; Weakness generalized; Anemia associated with acute blood loss; NSTEMI (non-ST elevated myocardial infarction); Systolic CHF, chronic last EF 40% 3/13; Iron deficiency anemia; Diarrhea; Edema of right lower extremity; CHF, acute on chronic; Dementia; CHF (congestive heart failure); Severe mitral regurgitation; Acute on chronic combined systolic and diastolic congestive heart failure; Chronic kidney disease (CKD), stage IV (severe); CAP (community acquired pneumonia); Hypoglycemia; Choking episode; Severe anemia; Cardiomyopathy, ischemic; Acute renal failure superimposed on stage 4 chronic kidney disease; Anemia in stage 4 chronic kidney disease; Lower GI bleeding; Acute blood loss anemia;  Swelling of both ankles; and CKD (chronic kidney disease) on her problem list.     has No Known Allergies.  [name] does not currently have medications on file.  SURGICAL HISTORY: Past Surgical History  Procedure Laterality Date  . Coronary artery bypass graft  July 2002    LIMA to LAD, SVG to OM1, SVG to OM 2, SVG to RCA  . Tee without cardioversion  01/28/2012    Procedure: TRANSESOPHAGEAL ECHOCARDIOGRAM (TEE);  Surgeon: Lelon Perla, MD;  Location: [facility] ENDOSCOPY;  Service: Cardiovascular;  Laterality: [name];  . Colonoscopy  2002  . Esophagogastroduodenoscopy  2002  . Esophagogastroduodenoscopy [name] 01/18/2014    TTS:VXBLT hiatal hernia. Abnormal gastric and duodenal bulbar mucosa of uncertain significance - status post gastric bx (chronic gastritis/H.pylori +  . Colonoscopy [name] 06/25/2014    Dr. Laural Golden: tubular adenoma, pandiverticulosis.   . Agile capsule [name] 07/06/2014    Procedure: AGILE CAPSULE;  Surgeon: Danie Binder, MD;  Location: [facility] ENDO SUITE;  Service: Endoscopy;  Laterality: [name];  730  . Esophagogastroduodenoscopy [name] 07/20/2014    Dr. Gala Romney: tiny gastric polyps not manipulated. minimal pigmentation of duodenal bulb likely not significant  . Givens capsule study [name] 07/20/2014    incomplete    SOCIAL HISTORY: History   Social History  . Marital Status: Single    Spouse Name: [name]    Number of Children: [name]  . Years of Education: [name]   Occupational History  . Not on file.   Social History Main Topics  . Smoking status: Former Smoker -- 68 years  .  Smokeless tobacco: Not on file  . Alcohol Use: No  . Drug Use: No  . Sexual Activity: Not on file   Other Topics Concern  . Not on file   Social History Narrative    FAMILY HISTORY: Family History  Problem Relation Age of Onset  . Stroke Father   . Diabetes type II Mother   . Colon cancer Neg Hx     ROS  PHYSICAL EXAMINATION  ECOG PERFORMANCE STATUS: {CHL ONC ECOG [phone]}  There were no  vitals filed for this visit.  Physical Exam  LABORATORY DATA:  CBC    Component Value Date/Time   WBC 5.5 12/11/2014 1238   RBC 4.62 12/11/2014 1238   RBC 1.74* 01/16/2014 1918   HGB 11.0* 12/11/2014 1238   HCT 39.1 12/11/2014 1238   PLT 182 12/11/2014 1238   MCV 84.6 12/11/2014 1238   MCH 23.8* 12/11/2014 1238   MCHC 28.1* 12/11/2014 1238   RDW 26.7* 12/11/2014 1238   LYMPHSABS 0.7 10/22/2014 1102   MONOABS 0.4 10/22/2014 1102   EOSABS 0.0 10/22/2014 1102   BASOSABS 0.0 10/22/2014 1102   CMP     Component Value Date/Time   NA 141 10/24/2014 0830   K 4.3 10/24/2014 0830   CL 109 10/24/2014 0830   CO2 19 10/24/2014 0830   GLUCOSE 171* 10/24/2014 0830   BUN 52* 10/24/2014 0830   CREATININE 2.48* 10/24/2014 0830   CALCIUM 7.9* 10/24/2014 0830   CALCIUM 8.3* 04/28/2014 0815   PROT 7.5 10/23/2014 0508   ALBUMIN 2.1* 10/23/2014 0508   AST 27 10/23/2014 0508   ALT 19 10/23/2014 0508   ALKPHOS 88 10/23/2014 0508   BILITOT 0.4 10/23/2014 0508   GFRNONAA 18* 10/24/2014 0830   GFRAA 21* 10/24/2014 0830     PENDING LABS:   RADIOGRAPHIC STUDIES:  No results found.   PATHOLOGY:     ASSESSMENT and THERAPY PLAN:    No problem-specific assessment & plan notes found for this encounter.   All questions were answered. The patient knows to call the clinic with any problems, questions or concerns. We can certainly see the patient much sooner if necessary.  [name] 12/18/2014

## 2014-12-22 ENCOUNTER — Encounter (HOSPITAL_COMMUNITY): Payer: Self-pay | Admitting: Emergency Medicine

## 2014-12-22 ENCOUNTER — Emergency Department (HOSPITAL_COMMUNITY): Payer: Medicare Other

## 2014-12-22 ENCOUNTER — Inpatient Hospital Stay (HOSPITAL_COMMUNITY)
Admission: EM | Admit: 2014-12-22 | Discharge: 2014-12-31 | DRG: 683 | Disposition: A | Discharge status: Home / Self Care | Payer: Medicare Other | Attending: Family Medicine | Admitting: Family Medicine

## 2014-12-22 DIAGNOSIS — F0390 Unspecified dementia without behavioral disturbance: Secondary | ICD-10-CM | POA: Diagnosis present

## 2014-12-22 DIAGNOSIS — E870 Hyperosmolality and hypernatremia: Secondary | ICD-10-CM | POA: Diagnosis present

## 2014-12-22 DIAGNOSIS — I252 Old myocardial infarction: Secondary | ICD-10-CM

## 2014-12-22 DIAGNOSIS — Z951 Presence of aortocoronary bypass graft: Secondary | ICD-10-CM

## 2014-12-22 DIAGNOSIS — N184 Chronic kidney disease, stage 4 (severe): Secondary | ICD-10-CM | POA: Diagnosis present

## 2014-12-22 DIAGNOSIS — N19 Unspecified kidney failure: Secondary | ICD-10-CM

## 2014-12-22 DIAGNOSIS — I2510 Atherosclerotic heart disease of native coronary artery without angina pectoris: Secondary | ICD-10-CM | POA: Diagnosis present

## 2014-12-22 DIAGNOSIS — T502X5A Adverse effect of carbonic-anhydrase inhibitors, benzothiadiazides and other diuretics, initial encounter: Secondary | ICD-10-CM | POA: Diagnosis present

## 2014-12-22 DIAGNOSIS — Z87891 Personal history of nicotine dependence: Secondary | ICD-10-CM

## 2014-12-22 DIAGNOSIS — I129 Hypertensive chronic kidney disease with stage 1 through stage 4 chronic kidney disease, or unspecified chronic kidney disease: Secondary | ICD-10-CM | POA: Diagnosis present

## 2014-12-22 DIAGNOSIS — Z833 Family history of diabetes mellitus: Secondary | ICD-10-CM

## 2014-12-22 DIAGNOSIS — D631 Anemia in chronic kidney disease: Secondary | ICD-10-CM | POA: Diagnosis present

## 2014-12-22 DIAGNOSIS — R569 Unspecified convulsions: Secondary | ICD-10-CM

## 2014-12-22 DIAGNOSIS — K625 Hemorrhage of anus and rectum: Secondary | ICD-10-CM

## 2014-12-22 DIAGNOSIS — T17908A Unspecified foreign body in respiratory tract, part unspecified causing other injury, initial encounter: Secondary | ICD-10-CM

## 2014-12-22 DIAGNOSIS — I10 Essential (primary) hypertension: Secondary | ICD-10-CM | POA: Diagnosis present

## 2014-12-22 DIAGNOSIS — N179 Acute kidney failure, unspecified: Secondary | ICD-10-CM | POA: Diagnosis not present

## 2014-12-22 DIAGNOSIS — D649 Anemia, unspecified: Secondary | ICD-10-CM | POA: Diagnosis present

## 2014-12-22 DIAGNOSIS — D509 Iron deficiency anemia, unspecified: Secondary | ICD-10-CM

## 2014-12-22 DIAGNOSIS — I255 Ischemic cardiomyopathy: Secondary | ICD-10-CM | POA: Diagnosis present

## 2014-12-22 DIAGNOSIS — R1310 Dysphagia, unspecified: Secondary | ICD-10-CM

## 2014-12-22 DIAGNOSIS — E782 Mixed hyperlipidemia: Secondary | ICD-10-CM | POA: Diagnosis present

## 2014-12-22 DIAGNOSIS — Z823 Family history of stroke: Secondary | ICD-10-CM

## 2014-12-22 DIAGNOSIS — Z8673 Personal history of transient ischemic attack (TIA), and cerebral infarction without residual deficits: Secondary | ICD-10-CM

## 2014-12-22 DIAGNOSIS — I509 Heart failure, unspecified: Secondary | ICD-10-CM | POA: Diagnosis present

## 2014-12-22 DIAGNOSIS — E860 Dehydration: Secondary | ICD-10-CM | POA: Diagnosis present

## 2014-12-22 DIAGNOSIS — E119 Type 2 diabetes mellitus without complications: Secondary | ICD-10-CM | POA: Diagnosis present

## 2014-12-22 DIAGNOSIS — E876 Hypokalemia: Secondary | ICD-10-CM | POA: Diagnosis present

## 2014-12-22 NOTE — ED Notes (Signed)
Informed Dr. Lita Mains of difficulty in placing IV. He stated that an IV was not needed at this time.

## 2014-12-22 NOTE — ED Notes (Signed)
Per EMS: they were called out for "choking" pt had witnessed seizure by family. Last seizure 2 years ago. Pt alert and follows commands at present.

## 2014-12-22 NOTE — ED Notes (Signed)
EMS reports pt was post ictal upon arrival, pt was suctioned en route.

## 2014-12-23 ENCOUNTER — Emergency Department (HOSPITAL_COMMUNITY): Payer: Medicare Other

## 2014-12-23 ENCOUNTER — Encounter (HOSPITAL_COMMUNITY): Payer: Self-pay | Admitting: Internal Medicine

## 2014-12-23 ENCOUNTER — Inpatient Hospital Stay (HOSPITAL_COMMUNITY): Payer: Medicare Other

## 2014-12-23 DIAGNOSIS — E876 Hypokalemia: Secondary | ICD-10-CM | POA: Diagnosis present

## 2014-12-23 DIAGNOSIS — T502X5A Adverse effect of carbonic-anhydrase inhibitors, benzothiadiazides and other diuretics, initial encounter: Secondary | ICD-10-CM | POA: Diagnosis present

## 2014-12-23 DIAGNOSIS — E782 Mixed hyperlipidemia: Secondary | ICD-10-CM | POA: Diagnosis present

## 2014-12-23 DIAGNOSIS — R1310 Dysphagia, unspecified: Secondary | ICD-10-CM | POA: Diagnosis not present

## 2014-12-23 DIAGNOSIS — R569 Unspecified convulsions: Secondary | ICD-10-CM | POA: Diagnosis not present

## 2014-12-23 DIAGNOSIS — I129 Hypertensive chronic kidney disease with stage 1 through stage 4 chronic kidney disease, or unspecified chronic kidney disease: Secondary | ICD-10-CM | POA: Diagnosis present

## 2014-12-23 DIAGNOSIS — N184 Chronic kidney disease, stage 4 (severe): Secondary | ICD-10-CM | POA: Diagnosis present

## 2014-12-23 DIAGNOSIS — E870 Hyperosmolality and hypernatremia: Secondary | ICD-10-CM | POA: Diagnosis present

## 2014-12-23 DIAGNOSIS — I2510 Atherosclerotic heart disease of native coronary artery without angina pectoris: Secondary | ICD-10-CM | POA: Diagnosis present

## 2014-12-23 DIAGNOSIS — I252 Old myocardial infarction: Secondary | ICD-10-CM | POA: Diagnosis not present

## 2014-12-23 DIAGNOSIS — D631 Anemia in chronic kidney disease: Secondary | ICD-10-CM | POA: Diagnosis present

## 2014-12-23 DIAGNOSIS — D649 Anemia, unspecified: Secondary | ICD-10-CM | POA: Diagnosis present

## 2014-12-23 DIAGNOSIS — E119 Type 2 diabetes mellitus without complications: Secondary | ICD-10-CM | POA: Diagnosis present

## 2014-12-23 DIAGNOSIS — Z87891 Personal history of nicotine dependence: Secondary | ICD-10-CM | POA: Diagnosis not present

## 2014-12-23 DIAGNOSIS — I255 Ischemic cardiomyopathy: Secondary | ICD-10-CM | POA: Diagnosis present

## 2014-12-23 DIAGNOSIS — Z951 Presence of aortocoronary bypass graft: Secondary | ICD-10-CM | POA: Diagnosis not present

## 2014-12-23 DIAGNOSIS — Z8673 Personal history of transient ischemic attack (TIA), and cerebral infarction without residual deficits: Secondary | ICD-10-CM | POA: Diagnosis not present

## 2014-12-23 DIAGNOSIS — N179 Acute kidney failure, unspecified: Secondary | ICD-10-CM | POA: Diagnosis present

## 2014-12-23 DIAGNOSIS — I509 Heart failure, unspecified: Secondary | ICD-10-CM | POA: Diagnosis present

## 2014-12-23 DIAGNOSIS — E860 Dehydration: Secondary | ICD-10-CM | POA: Diagnosis present

## 2014-12-23 DIAGNOSIS — Z833 Family history of diabetes mellitus: Secondary | ICD-10-CM | POA: Diagnosis not present

## 2014-12-23 DIAGNOSIS — F0390 Unspecified dementia without behavioral disturbance: Secondary | ICD-10-CM | POA: Diagnosis present

## 2014-12-23 DIAGNOSIS — Z823 Family history of stroke: Secondary | ICD-10-CM | POA: Diagnosis not present

## 2014-12-23 DIAGNOSIS — E118 Type 2 diabetes mellitus with unspecified complications: Secondary | ICD-10-CM | POA: Diagnosis not present

## 2014-12-23 LAB — CBC WITH DIFFERENTIAL/PLATELET
BASOS ABS: 0 10*3/uL (ref 0.0–0.1)
Basophils Relative: 0 % (ref 0–1)
Eosinophils Absolute: 0 10*3/uL (ref 0.0–0.7)
Eosinophils Relative: 0 % (ref 0–5)
HEMATOCRIT: 38.1 % (ref 36.0–46.0)
HEMOGLOBIN: 10.5 g/dL — AB (ref 12.0–15.0)
LYMPHS PCT: 20 % (ref 12–46)
Lymphs Abs: 1.1 10*3/uL (ref 0.7–4.0)
MCH: 23.8 pg — AB (ref 26.0–34.0)
MCHC: 27.6 g/dL — ABNORMAL LOW (ref 30.0–36.0)
MCV: 86.2 fL (ref 78.0–100.0)
Monocytes Absolute: 0.4 10*3/uL (ref 0.1–1.0)
Monocytes Relative: 8 % (ref 3–12)
NEUTROS PCT: 72 % (ref 43–77)
Neutro Abs: 4.1 10*3/uL (ref 1.7–7.7)
Platelets: 161 10*3/uL (ref 150–400)
RBC: 4.42 MIL/uL (ref 3.87–5.11)
RDW: 27.6 % — ABNORMAL HIGH (ref 11.5–15.5)
WBC: 5.7 10*3/uL (ref 4.0–10.5)

## 2014-12-23 LAB — URINE MICROSCOPIC-ADD ON

## 2014-12-23 LAB — URINALYSIS, ROUTINE W REFLEX MICROSCOPIC
Bilirubin Urine: NEGATIVE
Glucose, UA: NEGATIVE mg/dL
Ketones, ur: NEGATIVE mg/dL
Leukocytes, UA: NEGATIVE
Nitrite: NEGATIVE
PH: 6.5 (ref 5.0–8.0)
PROTEIN: 100 mg/dL — AB
Specific Gravity, Urine: 1.025 (ref 1.005–1.030)
Urobilinogen, UA: 0.2 mg/dL (ref 0.0–1.0)

## 2014-12-23 LAB — COMPREHENSIVE METABOLIC PANEL
ALT: 19 U/L (ref 0–35)
ANION GAP: 14 (ref 5–15)
AST: 35 U/L (ref 0–37)
Albumin: 2.7 g/dL — ABNORMAL LOW (ref 3.5–5.2)
Alkaline Phosphatase: 119 U/L — ABNORMAL HIGH (ref 39–117)
BUN: 80 mg/dL — ABNORMAL HIGH (ref 6–23)
CO2: 28 mmol/L (ref 19–32)
CREATININE: 4.15 mg/dL — AB (ref 0.50–1.10)
Calcium: 8.4 mg/dL (ref 8.4–10.5)
Chloride: 118 mmol/L — ABNORMAL HIGH (ref 96–112)
GFR calc Af Amer: 11 mL/min — ABNORMAL LOW (ref >90)
GFR, EST NON AFRICAN AMERICAN: 10 mL/min — AB (ref >90)
Glucose, Bld: 146 mg/dL — ABNORMAL HIGH (ref 70–99)
Potassium: 4 mmol/L (ref 3.5–5.1)
SODIUM: 160 mmol/L — AB (ref 135–145)
Total Bilirubin: 1.1 mg/dL (ref 0.3–1.2)
Total Protein: 8.1 g/dL (ref 6.0–8.3)

## 2014-12-23 LAB — BASIC METABOLIC PANEL
ANION GAP: 10 (ref 5–15)
BUN: 85 mg/dL — ABNORMAL HIGH (ref 6–23)
CHLORIDE: 118 mmol/L — AB (ref 96–112)
CO2: 27 mmol/L (ref 19–32)
CREATININE: 4.29 mg/dL — AB (ref 0.50–1.10)
Calcium: 7.8 mg/dL — ABNORMAL LOW (ref 8.4–10.5)
GFR calc Af Amer: 11 mL/min — ABNORMAL LOW (ref >90)
GFR calc non Af Amer: 9 mL/min — ABNORMAL LOW (ref >90)
GLUCOSE: 191 mg/dL — AB (ref 70–99)
Potassium: 3.9 mmol/L (ref 3.5–5.1)
SODIUM: 155 mmol/L — AB (ref 135–145)

## 2014-12-23 LAB — CBC
HEMATOCRIT: 36.4 % (ref 36.0–46.0)
Hemoglobin: 10.3 g/dL — ABNORMAL LOW (ref 12.0–15.0)
MCH: 24.1 pg — ABNORMAL LOW (ref 26.0–34.0)
MCHC: 28.3 g/dL — ABNORMAL LOW (ref 30.0–36.0)
MCV: 85.2 fL (ref 78.0–100.0)
Platelets: 153 10*3/uL (ref 150–400)
RBC: 4.27 MIL/uL (ref 3.87–5.11)
RDW: 27.5 % — AB (ref 11.5–15.5)
WBC: 4.6 10*3/uL (ref 4.0–10.5)

## 2014-12-23 LAB — GLUCOSE, CAPILLARY
GLUCOSE-CAPILLARY: 127 mg/dL — AB (ref 70–99)
GLUCOSE-CAPILLARY: 111 mg/dL — AB (ref 70–99)
Glucose-Capillary: 100 mg/dL — ABNORMAL HIGH (ref 70–99)

## 2014-12-23 LAB — SODIUM, URINE, RANDOM: Sodium, Ur: 74 mmol/L

## 2014-12-23 LAB — CBG MONITORING, ED: GLUCOSE-CAPILLARY: 164 mg/dL — AB (ref 70–99)

## 2014-12-23 MED ORDER — LEVETIRACETAM 100 MG/ML PO SOLN
500.0000 mg | Freq: Two times a day (BID) | ORAL | Status: DC
  Administered 2014-12-23 – 2014-12-31 (×17): 500 mg via ORAL
  Filled 2014-12-23 (×20): qty 5

## 2014-12-23 MED ORDER — DEXTROSE-NACL 5-0.45 % IV SOLN: INTRAVENOUS | Status: DC

## 2014-12-23 MED ORDER — SODIUM BICARBONATE 650 MG PO TABS
650.0000 mg | ORAL_TABLET | Freq: Three times a day (TID) | ORAL | Status: DC
Start: 2014-12-23 — End: 2014-12-31
  Administered 2014-12-23 – 2014-12-31 (×26): 650 mg via ORAL
  Filled 2014-12-23 (×27): qty 1

## 2014-12-23 MED ORDER — CALCITRIOL 0.25 MCG PO CAPS
0.2500 ug | ORAL_CAPSULE | Freq: Every day | ORAL | Status: DC
  Administered 2014-12-23 – 2014-12-31 (×9): 0.25 ug via ORAL
  Filled 2014-12-23 (×9): qty 1

## 2014-12-23 MED ORDER — ISOSORBIDE MONONITRATE ER 30 MG PO TB24
15.0000 mg | ORAL_TABLET | Freq: Every morning | ORAL | Status: DC
  Administered 2014-12-23 – 2014-12-31 (×9): 15 mg via ORAL
  Filled 2014-12-23 (×10): qty 1

## 2014-12-23 MED ORDER — SODIUM CHLORIDE 0.9 % IJ SOLN
3.0000 mL | Freq: Two times a day (BID) | INTRAMUSCULAR | Status: DC
  Administered 2014-12-23 – 2014-12-31 (×14): 3 mL via INTRAVENOUS

## 2014-12-23 MED ORDER — SODIUM CHLORIDE 0.9 % IV SOLN
INTRAVENOUS | Status: DC
  Administered 2014-12-23 (×2): via INTRAVENOUS

## 2014-12-23 MED ORDER — FOLIC ACID 1 MG PO TABS
1.0000 mg | ORAL_TABLET | Freq: Every morning | ORAL | Status: DC
Start: 2014-12-23 — End: 2014-12-31
  Administered 2014-12-23 – 2014-12-31 (×9): 1 mg via ORAL
  Filled 2014-12-23 (×10): qty 1

## 2014-12-23 MED ORDER — ACETAMINOPHEN 325 MG PO TABS
650.0000 mg | ORAL_TABLET | Freq: Four times a day (QID) | ORAL | Status: DC | PRN
Start: 2014-12-23 — End: 2014-12-31

## 2014-12-23 MED ORDER — ENOXAPARIN SODIUM 30 MG/0.3ML ~~LOC~~ SOLN
30.0000 mg | SUBCUTANEOUS | Status: DC
  Administered 2014-12-23 – 2014-12-31 (×9): 30 mg via SUBCUTANEOUS
  Filled 2014-12-23 (×9): qty 0.3

## 2014-12-23 MED ORDER — METOPROLOL SUCCINATE ER 50 MG PO TB24
50.0000 mg | ORAL_TABLET | Freq: Every morning | ORAL | Status: DC
Start: 2014-12-23 — End: 2014-12-31
  Administered 2014-12-23 – 2014-12-31 (×9): 50 mg via ORAL
  Filled 2014-12-23 (×10): qty 1

## 2014-12-23 MED ORDER — DEXTROSE-NACL 5-0.45 % IV SOLN
INTRAVENOUS | Status: DC
  Administered 2014-12-23 (×2): via INTRAVENOUS

## 2014-12-23 MED ORDER — DONEPEZIL HCL 5 MG PO TABS
10.0000 mg | ORAL_TABLET | Freq: Every day | ORAL | Status: DC
Start: 2014-12-23 — End: 2014-12-31
  Administered 2014-12-23 – 2014-12-30 (×8): 10 mg via ORAL
  Filled 2014-12-23 (×8): qty 2

## 2014-12-23 MED ORDER — HYDRALAZINE HCL 25 MG PO TABS
75.0000 mg | ORAL_TABLET | Freq: Three times a day (TID) | ORAL | Status: DC
  Administered 2014-12-23 – 2014-12-31 (×24): 75 mg via ORAL
  Filled 2014-12-23 (×25): qty 3

## 2014-12-23 MED ORDER — INSULIN ASPART 100 UNIT/ML ~~LOC~~ SOLN
0.0000 [IU] | Freq: Three times a day (TID) | SUBCUTANEOUS | Status: DC
  Administered 2014-12-23 – 2014-12-24 (×2): 1 [IU] via SUBCUTANEOUS
  Administered 2014-12-24: 2 [IU] via SUBCUTANEOUS
  Administered 2014-12-25: 1 [IU] via SUBCUTANEOUS
  Administered 2014-12-25 (×2): 2 [IU] via SUBCUTANEOUS
  Administered 2014-12-26 (×2): 3 [IU] via SUBCUTANEOUS
  Administered 2014-12-26: 1 [IU] via SUBCUTANEOUS
  Administered 2014-12-27: 3 [IU] via SUBCUTANEOUS
  Administered 2014-12-27: 2 [IU] via SUBCUTANEOUS
  Administered 2014-12-27: 1 [IU] via SUBCUTANEOUS
  Administered 2014-12-28: 5 [IU] via SUBCUTANEOUS
  Administered 2014-12-28 – 2014-12-29 (×3): 2 [IU] via SUBCUTANEOUS
  Administered 2014-12-29: 1 [IU] via SUBCUTANEOUS
  Administered 2014-12-29 – 2014-12-30 (×2): 2 [IU] via SUBCUTANEOUS
  Administered 2014-12-30: 3 [IU] via SUBCUTANEOUS
  Administered 2014-12-30: 1 [IU] via SUBCUTANEOUS
  Administered 2014-12-31 (×2): 2 [IU] via SUBCUTANEOUS

## 2014-12-23 MED ORDER — HYDRALAZINE HCL 20 MG/ML IJ SOLN: 5.0000 mg | Freq: Four times a day (QID) | INTRAMUSCULAR | Status: DC | PRN

## 2014-12-23 MED ORDER — ACETAMINOPHEN 650 MG RE SUPP: 650.0000 mg | Freq: Four times a day (QID) | RECTAL | Status: DC | PRN

## 2014-12-23 MED ORDER — LINAGLIPTIN 5 MG PO TABS
5.0000 mg | ORAL_TABLET | Freq: Every morning | ORAL | Status: DC
  Administered 2014-12-23 – 2014-12-27 (×5): 5 mg via ORAL
  Filled 2014-12-23 (×5): qty 1

## 2014-12-23 MED ORDER — PANTOPRAZOLE SODIUM 40 MG PO TBEC
40.0000 mg | DELAYED_RELEASE_TABLET | Freq: Every morning | ORAL | Status: DC
  Administered 2014-12-23 – 2014-12-31 (×9): 40 mg via ORAL
  Filled 2014-12-23 (×9): qty 1

## 2014-12-23 MED ORDER — PRAVASTATIN SODIUM 10 MG PO TABS
20.0000 mg | ORAL_TABLET | Freq: Every day | ORAL | Status: DC
  Administered 2014-12-23 – 2014-12-30 (×8): 20 mg via ORAL
  Filled 2014-12-23 (×8): qty 2

## 2014-12-23 NOTE — ED Provider Notes (Signed)
CSN: [id number]     Arrival date & time 12/22/14  2242 History   First MD Initiated Contact with Patient 12/22/14 2303     Chief Complaint  Patient presents with  . Seizures     (Consider location/radiation/quality/duration/timing/severity/associated sxs/prior Treatment) HPI Patient presents with witnessed seizure lasting several minutes around 10:30 PM. Witness states patient was drinking water and had choking-like episode and the eyes rolled back in her head. She began convulsing. She was unresponsive afterwards. She is now at her mental baseline. Patient has a history of seizure disorder. Last seizure was 2 years ago. She is taking Keppra and has not missed any doses recently. Patient denies any current pain. There was no trauma, specifically to the head and neck. Past Medical History  Diagnosis Date  . Type 2 diabetes mellitus   . Essential hypertension, benign   . History of stroke     Previously on Coumadin  . MI, old     Reported 53  . Seizure disorder   . Coronary atherosclerosis of native coronary artery     Multivessel status post CABG 2002  . History of GI bleed     Erosive gastritis and duodenitis by EGD 2002  . Mixed hyperlipidemia   . Ischemic cardiomyopathy     LVEF 40-45% March 2013  . Dementia   . Stroke   . Seizures   . CKD (chronic kidney disease) stage 4, GFR 15-29 ml/min   . CHF (congestive heart failure)    Past Surgical History  Procedure Laterality Date  . Coronary artery bypass graft  July 2002    LIMA to LAD, SVG to OM1, SVG to OM 2, SVG to RCA  . Tee without cardioversion  01/28/2012    Procedure: TRANSESOPHAGEAL ECHOCARDIOGRAM (TEE);  Surgeon: Lelon Perla, MD;  Location: [facility] ENDOSCOPY;  Service: Cardiovascular;  Laterality: [name];  . Colonoscopy  2002  . Esophagogastroduodenoscopy  2002  . Esophagogastroduodenoscopy [name] 01/18/2014    NUU:[id number] hiatal hernia. Abnormal gastric and duodenal bulbar mucosa of uncertain significance - status post  gastric bx (chronic gastritis/H.pylori +  . Colonoscopy [name] 06/25/2014    Dr. Laural Golden: tubular adenoma, pandiverticulosis.   . Agile capsule [name] 07/06/2014    Procedure: AGILE CAPSULE;  Surgeon: Danie Binder, MD;  Location: [facility] ENDO SUITE;  Service: Endoscopy;  Laterality: [name];  730  . Esophagogastroduodenoscopy [name] 07/20/2014    Dr. Gala Romney: tiny gastric polyps not manipulated. minimal pigmentation of duodenal bulb likely not significant  . Givens capsule study [name] 07/20/2014    incomplete   Family History  Problem Relation Age of Onset  . Stroke Father   . Diabetes type II Mother   . Colon cancer Neg Hx    History  Substance Use Topics  . Smoking status: Former Smoker -- 82 years  . Smokeless tobacco: Not on file  . Alcohol Use: No   OB History    No data available     Review of Systems  Constitutional: Negative for fever and chills.  Respiratory: Positive for choking. Negative for cough, shortness of breath and wheezing.   Cardiovascular: Negative for chest pain.  Gastrointestinal: Negative for nausea, vomiting and abdominal pain.  Musculoskeletal: Negative for back pain, neck pain and neck stiffness.  Skin: Negative for rash and wound.  Neurological: Positive for seizures. Negative for dizziness, numbness and headaches.  All other systems reviewed and are negative.     Allergies  Review of patient's allergies indicates no known allergies.  Home Medications   Prior to Admission medications   Medication Sig Start Date End Date Taking? Authorizing Provider  calcitRIOL (ROCALTROL) 0.25 MCG capsule Take 1 capsule (0.25 mcg total) by mouth daily. 05/02/14  Yes Eugenie Filler, MD  donepezil (ARICEPT) 10 MG tablet Take 10 mg by mouth at bedtime.  01/30/14  Yes Historical Provider, MD  folic acid (FOLVITE) 1 MG tablet Take 1 mg by mouth every morning.    Yes Historical Provider, MD  hydrocortisone (ANUSOL-HC) 25 MG suppository Place 1 suppository (25 mg total) rectally 2 (two)  times daily. 10/25/14  Yes Kathie Dike, MD  insulin detemir (LEVEMIR) 100 UNIT/ML injection Inject 0.08 mLs (8 Units total) into the skin at bedtime. 05/02/14  Yes Eugenie Filler, MD  isosorbide mononitrate (IMDUR) 30 MG 24 hr tablet Take 0.5 mg by mouth every morning.   Yes Historical Provider, MD  levETIRAcetam (KEPPRA) 100 MG/ML solution Take 5 mLs by mouth 2 (two) times daily. 01/13/14  Yes Historical Provider, MD  linagliptin (TRADJENTA) 5 MG TABS tablet Take 5 mg by mouth every morning.    Yes Historical Provider, MD  metoprolol succinate (TOPROL-XL) 100 MG 24 hr tablet Take 50 mg by mouth every morning. Take with or immediately following a meal.   Yes Historical Provider, MD  pantoprazole (PROTONIX) 40 MG tablet Take 40 mg by mouth every morning.   Yes Historical Provider, MD  potassium chloride (K-DUR) 10 MEQ tablet Take 10 mEq by mouth daily. Takes with torsemide 09/21/14  Yes Historical Provider, MD  pravastatin (PRAVACHOL) 20 MG tablet Take 20 mg by mouth at bedtime.    Yes Historical Provider, MD  sodium bicarbonate 650 MG tablet Take 650 mg by mouth 3 (three) times daily.   Yes Historical Provider, MD  torsemide (DEMADEX) 100 MG tablet Take 50 mg by mouth daily.  09/21/14  Yes Historical Provider, MD  hydrALAZINE (APRESOLINE) 25 MG tablet Take 75 mg by mouth every 8 (eight) hours.    Historical Provider, MD   BP 164/82 mmHg  Pulse 59  Temp(Src) 97.6 F (36.4 C) (Oral)  Resp 33  Ht 5\' 6"  (1.676 m)  Wt 173 lb (78.472 kg)  BMI 27.94 kg/m2  SpO2 95% Physical Exam  Constitutional: She is oriented to person, place, and time. She appears well-developed and well-nourished. No distress.  HENT:  Head: Normocephalic and atraumatic.  Mouth/Throat: Oropharynx is clear and moist.  Small abrasion to the left tip of tongue.  Eyes: EOM are normal. Pupils are equal, round, and reactive to light.  Neck: Normal range of motion. Neck supple.  No posterior midline cervical tenderness to  palpation.  Cardiovascular: Normal rate and regular rhythm.  Exam reveals no gallop and no friction rub.   No murmur heard. Pulmonary/Chest: Effort normal and breath sounds normal. No respiratory distress. She has no wheezes. She has no rales.  Abdominal: Soft. Bowel sounds are normal. She exhibits no distension and no mass. There is no tenderness. There is no rebound and no guarding.  Musculoskeletal: Normal range of motion. She exhibits no edema or tenderness.  No thoracic or lumbar tenderness. No calf swelling or pain.  Neurological: She is alert and oriented to person, place, and time.  5/5 motor in all extremities. Sensation is fully intact.  Skin: Skin is warm and dry. No rash noted. No erythema.  Psychiatric: She has a normal mood and affect. Her behavior is normal.  Nursing note and vitals reviewed.   ED Course  Angiocath insertion Date/Time: 12/23/2014 1:20 AM Performed by: Julianne Rice Authorized by: Lita Mains, Deaundra Dupriest Consent: Verbal consent obtained. Preparation: Patient was prepped and draped in the usual sterile fashion. Local anesthesia used: no Patient sedated: no Patient tolerance: Patient tolerated the procedure well with no immediate complications Comments: 57-WIOMB peripheral IV placed and the patient's left external jugular vein. Good venous flow. No complications.   (including critical care time) Labs Review Labs Reviewed  BASIC METABOLIC PANEL - Abnormal; Notable for the following:    Sodium 155 (*)    Chloride 118 (*)    Glucose, Bld 191 (*)    BUN 85 (*)    Creatinine, Ser 4.29 (*)    Calcium 7.8 (*)    GFR calc non Af Amer 9 (*)    GFR calc Af Amer 11 (*)    All other components within normal limits  CBC - Abnormal; Notable for the following:    Hemoglobin 10.3 (*)    MCH 24.1 (*)    MCHC 28.3 (*)    RDW 27.5 (*)    All other components within normal limits  CBG MONITORING, ED - Abnormal; Notable for the following:    Glucose-Capillary 164 (*)     All other components within normal limits  URINALYSIS, ROUTINE W REFLEX MICROSCOPIC    Imaging Review Dg Chest Port 1 View  12/22/2014   CLINICAL DATA:  Witnessed seizure. Choking sensation tonight. History of stroke, MI, chronic kidney disease, heart disease, ischemic cardiomyopathy.  EXAM: PORTABLE CHEST - 1 VIEW  COMPARISON:  10/22/2014  FINDINGS: Postoperative changes in the mediastinum. Mild cardiac enlargement with increased pulmonary vascularity. No evidence of significant edema or consolidation. No blunting of costophrenic angles. No pneumothorax. Calcified and tortuous aorta.  IMPRESSION: Cardiac enlargement with mild pulmonary vascular congestion.   Electronically Signed   By: Lucienne Capers M.D.   On: 12/22/2014 23:36     EKG Interpretation   Date/Time:  Friday December 22 2014 22:48:33 EST Ventricular Rate:  72 PR Interval:  177 QRS Duration: 87 QT Interval:  495 QTC Calculation: 542 R Axis:   63 Text Interpretation:  Sinus rhythm Ventricular premature complex Low  voltage, extremity leads Borderline repolarization abnormality Prolonged  QT interval Confirmed by Lita Mains  MD, Herchel Hopkin ([id number]) on 12/23/2014  12:08:30 AM      MDM   Final diagnoses:  Seizure  Hypernatremia  Renal failure    Patient with history of seizure disorder with witnessed seizure. Mental status now resolving to her baseline. Patient has follow-up with her neurologist next week.  Patient has acute on chronic renal failure. Also is hyponatremic. Chest x-ray with mild congestion. Discussed with [facility] hospitalist. Will admit to telemetry bed. Asked to get CT head in the emergency department.  Julianne Rice, MD 12/23/14 [phone]

## 2014-12-23 NOTE — H&P (Addendum)
[name] is an 74 y.o. female.    [place] (pcp)  Chief Complaint: seizure HPI: 74 yo dm2, htn, seizure, ckd stage 3 was found to have seizure ?  After having difficulty swallowing water.  Family says that the shaking might have been related to her having choking.  "strangled on water". Shaking lasted about 10-15 minutes.  Pt appears to be at baseline per her family.  Pt was brought to ED and found to have ARF, and hypernatremia.   Past Medical History  Diagnosis Date  . Type 2 diabetes mellitus   . Essential hypertension, benign   . History of stroke     Previously on Coumadin  . MI, old     Reported 29  . Seizure disorder   . Coronary atherosclerosis of native coronary artery     Multivessel status post CABG 2002  . History of GI bleed     Erosive gastritis and duodenitis by EGD 2002  . Mixed hyperlipidemia   . Ischemic cardiomyopathy     LVEF 40-45% March 2013  . Dementia   . Stroke   . Seizures   . CKD (chronic kidney disease) stage 4, GFR 15-29 ml/min   . CHF (congestive heart failure)     Past Surgical History  Procedure Laterality Date  . Coronary artery bypass graft  July 2002    LIMA to LAD, SVG to OM1, SVG to OM 2, SVG to RCA  . Tee without cardioversion  01/28/2012    Procedure: TRANSESOPHAGEAL ECHOCARDIOGRAM (TEE);  Surgeon: Lelon Perla, MD;  Location: [facility] ENDOSCOPY;  Service: Cardiovascular;  Laterality: [name];  . Colonoscopy  2002  . Esophagogastroduodenoscopy  2002  . Esophagogastroduodenoscopy [name] 01/18/2014    MMN:[id number] hiatal hernia. Abnormal gastric and duodenal bulbar mucosa of uncertain significance - status post gastric bx (chronic gastritis/H.pylori +  . Colonoscopy [name] 06/25/2014    Dr. Laural Golden: tubular adenoma, pandiverticulosis.   . Agile capsule [name] 07/06/2014    Procedure: AGILE CAPSULE;  Surgeon: Danie Binder, MD;  Location: [facility] ENDO SU[facility];  Service: Endoscopy;  Laterality: [name];  730  . Esophagogastroduodenoscopy [name] 07/20/2014    Dr.  Gala Romney: tiny gastric polyps not manipulated. minimal pigmentation of duodenal bulb likely not significant  . Givens capsule study [name] 07/20/2014    incomplete    Family History  Problem Relation Age of Onset  . Stroke Father   . Diabetes type II Mother   . Colon cancer Neg Hx    Social History:  reports that she has quit smoking. Her smoking use included Cigarettes. She has a 50 pack-year smoking history. She does not have any smokeless tobacco history on file. She reports that she does not drink alcohol or use illicit drugs.  Allergies: No Known Allergies Medications reviewed   (Not in a hospital admission)  Results for orders placed or performed during the hospital encounter of 12/22/14 (from the past 48 hour(s))  Basic metabolic panel (if new onset seizures)     Status: Abnormal   Collection Time: 12/22/14 11:40 PM  Result Value Ref Range   Sodium 155 (H) 135 - 145 mmol/L   Potassium 3.9 3.5 - 5.1 mmol/L   Chloride 118 (H) 96 - 112 mmol/L   CO2 27 19 - 32 mmol/L   Glucose, Bld 191 (H) 70 - 99 mg/dL   BUN 85 (H) 6 - 23 mg/dL   Creatinine, Ser 4.29 (H) 0.50 - 1.10 mg/dL   Calcium 7.8 (L)  8.4 - 10.5 mg/dL   GFR calc non Af Amer 9 (L) >90 mL/min   GFR calc Af Amer 11 (L) >90 mL/min    Comment: (NOTE) The eGFR has been calculated using the CKD EPI equation. This calculation has not been validated in all clinical situations. eGFR's persistently <90 mL/min signify possible Chronic Kidney Disease.    Anion gap 10 5 - 15  CBC (if new onset seizures)     Status: Abnormal   Collection Time: 12/22/14 11:40 PM  Result Value Ref Range   WBC 4.6 4.0 - 10.5 K/uL   RBC 4.27 3.87 - 5.11 MIL/uL   Hemoglobin 10.3 (L) 12.0 - 15.0 g/dL   HCT 36.4 36.0 - 46.0 %   MCV 85.2 78.0 - 100.0 fL   MCH 24.1 (L) 26.0 - 34.0 pg   MCHC 28.3 (L) 30.0 - 36.0 g/dL   RDW 27.5 (H) 11.5 - 15.5 %   Platelets 153 150 - 400 K/uL  CBG monitoring, ED     Status: Abnormal   Collection Time: 12/23/14 12:33 AM   Result Value Ref Range   Glucose-Capillary 164 (H) 70 - 99 mg/dL   Dg Chest Port 1 View  12/22/2014   CLINICAL DATA:  Witnessed seizure. Choking sensation tonight. History of stroke, MI, chronic kidney disease, heart disease, ischemic cardiomyopathy.  EXAM: PORTABLE CHEST - 1 VIEW  COMPARISON:  10/22/2014  FINDINGS: Postoperative changes in the mediastinum. Mild cardiac enlargement with increased pulmonary vascularity. No evidence of significant edema or consolidation. No blunting of costophrenic angles. No pneumothorax. Calcified and tortuous aorta.  IMPRESSION: Cardiac enlargement with mild pulmonary vascular congestion.   Electronically Signed   By: Lucienne Capers M.D.   On: 12/22/2014 23:36    Review of Systems  Constitutional: Negative for fever, chills, weight loss, malaise/fatigue and diaphoresis.  HENT: Negative for congestion, ear discharge, ear pain, hearing loss, nosebleeds, sore throat and tinnitus.   Eyes: Negative for blurred vision, double vision, photophobia, pain, discharge and redness.  Respiratory: Negative for cough, hemoptysis, sputum production, shortness of breath, wheezing and stridor.   Cardiovascular: Negative for chest pain, palpitations, orthopnea, claudication, leg swelling and PND.  Gastrointestinal: Negative for heartburn, nausea, vomiting, abdominal pain, diarrhea, constipation, blood in stool and melena.  Genitourinary: Negative for dysuria, urgency, frequency, hematuria and flank pain.  Musculoskeletal: Negative for myalgias, back pain, joint pain, falls and neck pain.  Skin: Negative for itching and rash.  Neurological: Positive for seizures. Negative for dizziness, tingling, tremors, sensory change, speech change, focal weakness, weakness and headaches.  Endo/Heme/Allergies: Negative for environmental allergies and polydipsia. Does not bruise/bleed easily.  Psychiatric/Behavioral: Positive for memory loss. Negative for depression, suicidal ideas,  hallucinations and substance abuse. The patient is not nervous/anxious and does not have insomnia.     Blood pressure 164/82, pulse 59, temperature 97.6 F (36.4 C), temperature source Oral, resp. rate 33, height 5' 6"  (1.676 m), weight 78.472 kg (173 lb), SpO2 95 %. Physical Exam  Constitutional: She appears well-developed and well-nourished.  HENT:  Head: Normocephalic and atraumatic.  Eyes: Conjunctivae and EOM are normal. Pupils are equal, round, and reactive to light. No scleral icterus.  Neck: Normal range of motion. Neck supple. No JVD present. No tracheal deviation present. No thyromegaly present.  Cardiovascular: Normal rate and regular rhythm.  Exam reveals no gallop and no friction rub.   No murmur heard. Respiratory: Effort normal and breath sounds normal. No respiratory distress. She has no wheezes.  GI: Soft.  Bowel sounds are normal. She exhibits no distension. There is no tenderness. There is no rebound and no guarding.  Musculoskeletal: Normal range of motion. She exhibits no edema or tenderness.  Lymphadenopathy:    She has no cervical adenopathy.  Neurological: She is alert. She has normal reflexes. She displays normal reflexes. No cranial nerve deficit. She exhibits normal muscle tone. Coordination normal.  Skin: Skin is warm and dry. No rash noted. No erythema. No pallor.  Psychiatric: She has a normal mood and affect. Her behavior is normal. Judgment and thought content normal.     Assessment/Plan Acute renal failure Check urine sodium, urine creatinine, urine eosinophils Renal ultrasound Hydrate with normal saline Family denies nsaid use Hold torsemide (note per family dose recently increased for edema) Hold ace-i/arb  Seizure do Cont keppra Ct scan brain EEG  Hypertension uncontrolled Hydralazine 109m iv q6h prn   dm2 fsbs ac and qhs, iss  CHF (EF 40%) Strict i and o, daily wt  Hypernatremia Ns iv  Garrus Gauthreaux 12/23/2014, 1:55 AM

## 2014-12-23 NOTE — Progress Notes (Signed)
Patient admitted by Dr. Maudie Mercury earlier this morning.  Patient seen and examined.  She has been admitted for possible seizure, but was also found to have acute on chronic kidney disease stage 4. She also has significant hypernatremia. On examination, she has evidence of peripheral edema and crackles at bases on lung exam. She has been started on IV fluids. Will request nephrology consultation to follow. Renal ultrasound does not show any obstruction. She was taking diuretics and ACE prior to admission.  [name]

## 2014-12-23 NOTE — ED Notes (Signed)
Daughter: Cherise Fedder [phone]

## 2014-12-24 DIAGNOSIS — I10 Essential (primary) hypertension: Secondary | ICD-10-CM

## 2014-12-24 DIAGNOSIS — R1310 Dysphagia, unspecified: Secondary | ICD-10-CM

## 2014-12-24 DIAGNOSIS — N184 Chronic kidney disease, stage 4 (severe): Secondary | ICD-10-CM

## 2014-12-24 DIAGNOSIS — E118 Type 2 diabetes mellitus with unspecified complications: Secondary | ICD-10-CM

## 2014-12-24 LAB — GLUCOSE, CAPILLARY
GLUCOSE-CAPILLARY: 99 mg/dL (ref 70–99)
GLUCOSE-CAPILLARY: 131 mg/dL — AB (ref 70–99)
Glucose-Capillary: 157 mg/dL — ABNORMAL HIGH (ref 70–99)
Glucose-Capillary: 140 mg/dL — ABNORMAL HIGH (ref 70–99)

## 2014-12-24 LAB — CBC
HEMATOCRIT: 37.6 % (ref 36.0–46.0)
Hemoglobin: 10.6 g/dL — ABNORMAL LOW (ref 12.0–15.0)
MCH: 24 pg — ABNORMAL LOW (ref 26.0–34.0)
MCHC: 28.2 g/dL — ABNORMAL LOW (ref 30.0–36.0)
MCV: 85.3 fL (ref 78.0–100.0)
Platelets: 156 10*3/uL (ref 150–400)
RBC: 4.41 MIL/uL (ref 3.87–5.11)
RDW: 27.7 % — AB (ref 11.5–15.5)
WBC: 4.2 10*3/uL (ref 4.0–10.5)

## 2014-12-24 LAB — BASIC METABOLIC PANEL
Anion gap: 8 (ref 5–15)
BUN: 73 mg/dL — ABNORMAL HIGH (ref 6–23)
CHLORIDE: 120 mmol/L — AB (ref 96–112)
CO2: 31 mmol/L (ref 19–32)
Calcium: 8.2 mg/dL — ABNORMAL LOW (ref 8.4–10.5)
Creatinine, Ser: 3.74 mg/dL — ABNORMAL HIGH (ref 0.50–1.10)
GFR calc non Af Amer: 11 mL/min — ABNORMAL LOW (ref >90)
GFR, EST AFRICAN AMERICAN: 13 mL/min — AB (ref >90)
Glucose, Bld: 178 mg/dL — ABNORMAL HIGH (ref 70–99)
POTASSIUM: 3.5 mmol/L (ref 3.5–5.1)
Sodium: 159 mmol/L — ABNORMAL HIGH (ref 135–145)

## 2014-12-24 LAB — CALCIUM / CREATININE RATIO, URINE
CALCIUM/CREAT. RATIO: 0
Calcium, Ur: 1 mg/dL
Creatinine, Urine: 85.5 mg/dL

## 2014-12-24 MED ORDER — LORAZEPAM 0.5 MG PO TABS
0.5000 mg | ORAL_TABLET | Freq: Once | ORAL | Status: AC
Start: 2014-12-24 — End: 2014-12-25
  Administered 2014-12-25: 0.5 mg via ORAL
  Filled 2014-12-24: qty 1

## 2014-12-24 MED ORDER — DEXTROSE 5 % IV SOLN
INTRAVENOUS | Status: DC
  Administered 2014-12-24 – 2014-12-27 (×5): via INTRAVENOUS

## 2014-12-24 NOTE — Plan of Care (Signed)
Problem: Phase I Progression Outcomes Goal: OOB as tolerated unless otherwise ordered Outcome: Completed/Met Date Met:  12/24/14 Patient up to chair today and [facility] to [facility] as needed.

## 2014-12-24 NOTE — Consult Note (Signed)
Reason for Consult: Worsening renal failure and hypernatremia Referring Physician: Dr. Rolanda Lundborg [name] is an 74 y.o. female.  HPI: She is a patient who has history of diabetes, hypertension, CVA and history of chronic renal failure stage IV presently was brought by her family is with history of seizure activity and choking. Patient has been admitted previously because of difficulty in swallowing. Presently when she was evaluated her renal function has declined further and also she has hypernatremia. Presently patient denies any difficulty breathing or orthopnea. She is thirsty. No nausea or vomiting.  Past Medical History  Diagnosis Date  . Type 2 diabetes mellitus   . Essential hypertension, benign   . History of stroke     Previously on Coumadin  . MI, old     Reported 52  . Seizure disorder   . Coronary atherosclerosis of native coronary artery     Multivessel status post CABG 2002  . History of GI bleed     Erosive gastritis and duodenitis by EGD 2002  . Mixed hyperlipidemia   . Ischemic cardiomyopathy     LVEF 40-45% March 2013  . Dementia   . Stroke   . Seizures   . CKD (chronic kidney disease) stage 4, GFR 15-29 ml/min   . CHF (congestive heart failure)     Past Surgical History  Procedure Laterality Date  . Coronary artery bypass graft  July 2002    LIMA to LAD, SVG to OM1, SVG to OM 2, SVG to RCA  . Tee without cardioversion  01/28/2012    Procedure: TRANSESOPHAGEAL ECHOCARDIOGRAM (TEE);  Surgeon: Lelon Perla, MD;  Location: [facility] ENDOSCOPY;  Service: Cardiovascular;  Laterality: [name];  . Colonoscopy  2002  . Esophagogastroduodenoscopy  2002  . Esophagogastroduodenoscopy [name] 01/18/2014    BOF:BPZWC hiatal hernia. Abnormal gastric and duodenal bulbar mucosa of uncertain significance - status post gastric bx (chronic gastritis/H.pylori +  . Colonoscopy [name] 06/25/2014    Dr. Laural Golden: tubular adenoma, pandiverticulosis.   . Agile capsule [name] 07/06/2014    Procedure:  AGILE CAPSULE;  Surgeon: Danie Binder, MD;  Location: [facility] ENDO SUITE;  Service: Endoscopy;  Laterality: [name];  730  . Esophagogastroduodenoscopy [name] 07/20/2014    Dr. Gala Romney: tiny gastric polyps not manipulated. minimal pigmentation of duodenal bulb likely not significant  . Givens capsule study [name] 07/20/2014    incomplete    Family History  Problem Relation Age of Onset  . Stroke Father   . Diabetes type II Mother   . Colon cancer Neg Hx     Social History:  reports that she has quit smoking. Her smoking use included Cigarettes. She has a 50 pack-year smoking history. She does not have any smokeless tobacco history on file. She reports that she does not drink alcohol or use illicit drugs.  Allergies: No Known Allergies  Medications: I have reviewed the patient's current medications.  Results for orders placed or performed during the hospital encounter of 12/22/14 (from the past 48 hour(s))  Basic metabolic panel (if new onset seizures)     Status: Abnormal   Collection Time: 12/22/14 11:40 PM  Result Value Ref Range   Sodium 155 (H) 135 - 145 mmol/L   Potassium 3.9 3.5 - 5.1 mmol/L   Chloride 118 (H) 96 - 112 mmol/L   CO2 27 19 - 32 mmol/L   Glucose, Bld 191 (H) 70 - 99 mg/dL   BUN 85 (H) 6 - 23 mg/dL   Creatinine, Ser  4.29 (H) 0.50 - 1.10 mg/dL   Calcium 7.8 (L) 8.4 - 10.5 mg/dL   GFR calc non Af Amer 9 (L) >90 mL/min   GFR calc Af Amer 11 (L) >90 mL/min    Comment: (NOTE) The eGFR has been calculated using the CKD EPI equation. This calculation has not been validated in all clinical situations. eGFR's persistently <90 mL/min signify possible Chronic Kidney Disease.    Anion gap 10 5 - 15  CBC (if new onset seizures)     Status: Abnormal   Collection Time: 12/22/14 11:40 PM  Result Value Ref Range   WBC 4.6 4.0 - 10.5 K/uL   RBC 4.27 3.87 - 5.11 MIL/uL   Hemoglobin 10.3 (L) 12.0 - 15.0 g/dL   HCT 36.4 36.0 - 46.0 %   MCV 85.2 78.0 - 100.0 fL   MCH 24.1 (L) 26.0 - 34.0  pg   MCHC 28.3 (L) 30.0 - 36.0 g/dL   RDW 27.5 (H) 11.5 - 15.5 %   Platelets 153 150 - 400 K/uL  CBG monitoring, ED     Status: Abnormal   Collection Time: 12/23/14 12:33 AM  Result Value Ref Range   Glucose-Capillary 164 (H) 70 - 99 mg/dL  Urinalysis, Routine w reflex microscopic     Status: Abnormal   Collection Time: 12/23/14  3:02 AM  Result Value Ref Range   Color, Urine YELLOW YELLOW   APPearance CLEAR CLEAR   Specific Gravity, Urine 1.025 1.005 - 1.030   pH 6.5 5.0 - 8.0   Glucose, UA NEGATIVE NEGATIVE mg/dL   Hgb urine dipstick SMALL (A) NEGATIVE   Bilirubin Urine NEGATIVE NEGATIVE   Ketones, ur NEGATIVE NEGATIVE mg/dL   Protein, ur 100 (A) NEGATIVE mg/dL   Urobilinogen, UA 0.2 0.0 - 1.0 mg/dL   Nitrite NEGATIVE NEGATIVE   Leukocytes, UA NEGATIVE NEGATIVE  Urine microscopic-add on     Status: Abnormal   Collection Time: 12/23/14  3:02 AM  Result Value Ref Range   Squamous Epithelial / LPF RARE RARE   WBC, UA 7-10 <3 WBC/hpf   RBC / HPF 0-2 <3 RBC/hpf   Bacteria, UA MANY (A) RARE  CBC with Differential/Platelet     Status: Abnormal   Collection Time: 12/23/14  6:39 AM  Result Value Ref Range   WBC 5.7 4.0 - 10.5 K/uL   RBC 4.42 3.87 - 5.11 MIL/uL   Hemoglobin 10.5 (L) 12.0 - 15.0 g/dL   HCT 38.1 36.0 - 46.0 %   MCV 86.2 78.0 - 100.0 fL   MCH 23.8 (L) 26.0 - 34.0 pg   MCHC 27.6 (L) 30.0 - 36.0 g/dL   RDW 27.6 (H) 11.5 - 15.5 %   Platelets 161 150 - 400 K/uL   Neutrophils Relative % 72 43 - 77 %   Neutro Abs 4.1 1.7 - 7.7 K/uL   Lymphocytes Relative 20 12 - 46 %   Lymphs Abs 1.1 0.7 - 4.0 K/uL   Monocytes Relative 8 3 - 12 %   Monocytes Absolute 0.4 0.1 - 1.0 K/uL   Eosinophils Relative 0 0 - 5 %   Eosinophils Absolute 0.0 0.0 - 0.7 K/uL   Basophils Relative 0 0 - 1 %   Basophils Absolute 0.0 0.0 - 0.1 K/uL   RBC Morphology TARGET CELLS     Comment: TEARDROP CELLS SCHISTOCYTES NOTED ON SMEAR POLYCHROMASIA PRESENT    Smear Review LARGE PLATELETS PRESENT    Comprehensive metabolic panel     Status:  Abnormal   Collection Time: 12/23/14  6:39 AM  Result Value Ref Range   Sodium 160 (H) 135 - 145 mmol/L   Potassium 4.0 3.5 - 5.1 mmol/L   Chloride 118 (H) 96 - 112 mmol/L   CO2 28 19 - 32 mmol/L   Glucose, Bld 146 (H) 70 - 99 mg/dL   BUN 80 (H) 6 - 23 mg/dL   Creatinine, Ser 4.15 (H) 0.50 - 1.10 mg/dL   Calcium 8.4 8.4 - 10.5 mg/dL   Total Protein 8.1 6.0 - 8.3 g/dL   Albumin 2.7 (L) 3.5 - 5.2 g/dL   AST 35 0 - 37 U/L   ALT 19 0 - 35 U/L   Alkaline Phosphatase 119 (H) 39 - 117 U/L   Total Bilirubin 1.1 0.3 - 1.2 mg/dL   GFR calc non Af Amer 10 (L) >90 mL/min   GFR calc Af Amer 11 (L) >90 mL/min    Comment: (NOTE) The eGFR has been calculated using the CKD EPI equation. This calculation has not been validated in all clinical situations. eGFR's persistently <90 mL/min signify possible Chronic Kidney Disease.    Anion gap 14 5 - 15  Glucose, capillary     Status: Abnormal   Collection Time: 12/23/14  7:56 AM  Result Value Ref Range   Glucose-Capillary 127 (H) 70 - 99 mg/dL   Comment 1 Documented in Chart    Comment 2 Notify RN   Glucose, capillary     Status: Abnormal   Collection Time: 12/23/14 11:34 AM  Result Value Ref Range   Glucose-Capillary 100 (H) 70 - 99 mg/dL   Comment 1 Documented in Chart    Comment 2 Notify RN   Sodium, urine, random     Status: None   Collection Time: 12/23/14 12:33 PM  Result Value Ref Range   Sodium, Ur 74 mmol/L  Calcium / creatinine ratio, urine     Status: None   Collection Time: 12/23/14 12:33 PM  Result Value Ref Range   Calcium, Ur 1 mg/dL   Creatinine, Urine 85.5 mg/dL    Comment: No reference range established.   Calcium/Creat.Ratio 0.0     Comment: Performed at [facility]  Glucose, capillary     Status: Abnormal   Collection Time: 12/23/14  4:19 PM  Result Value Ref Range   Glucose-Capillary 111 (H) 70 - 99 mg/dL   Comment 1 Documented in Chart    Comment 2 Notify RN    Basic metabolic panel     Status: Abnormal   Collection Time: 12/24/14  6:33 AM  Result Value Ref Range   Sodium 159 (H) 135 - 145 mmol/L   Potassium 3.5 3.5 - 5.1 mmol/L   Chloride 120 (H) 96 - 112 mmol/L   CO2 31 19 - 32 mmol/L   Glucose, Bld 178 (H) 70 - 99 mg/dL   BUN 73 (H) 6 - 23 mg/dL   Creatinine, Ser 3.74 (H) 0.50 - 1.10 mg/dL   Calcium 8.2 (L) 8.4 - 10.5 mg/dL   GFR calc non Af Amer 11 (L) >90 mL/min   GFR calc Af Amer 13 (L) >90 mL/min    Comment: (NOTE) The eGFR has been calculated using the CKD EPI equation. This calculation has not been validated in all clinical situations. eGFR's persistently <90 mL/min signify possible Chronic Kidney Disease.    Anion gap 8 5 - 15  CBC     Status: Abnormal   Collection Time: 12/24/14  6:33  AM  Result Value Ref Range   WBC 4.2 4.0 - 10.5 K/uL   RBC 4.41 3.87 - 5.11 MIL/uL   Hemoglobin 10.6 (L) 12.0 - 15.0 g/dL   HCT 37.6 36.0 - 46.0 %   MCV 85.3 78.0 - 100.0 fL   MCH 24.0 (L) 26.0 - 34.0 pg   MCHC 28.2 (L) 30.0 - 36.0 g/dL   RDW 27.7 (H) 11.5 - 15.5 %   Platelets 156 150 - 400 K/uL    Comment: SPECIMEN CHECKED FOR CLOTS LARGE PLATELETS PRESENT PLATELET COUNT CONFIRMED BY SMEAR     Ct Head Wo Contrast  12/23/2014   CLINICAL DATA:  Witnessed seizure 4 hr ago. Subsequent unresponsive. History of seizures, stroke, diabetes, chronic kidney disease.  EXAM: CT HEAD WITHOUT CONTRAST  TECHNIQUE: Contiguous axial images were obtained from the base of the skull through the vertex without intravenous contrast.  COMPARISON:  CT of the head March 15, 2014  FINDINGS: No intraparenchymal hemorrhage, mass effect, midline shift or acute large vascular territory infarct. RIGHT occipital encephalomalacia with mild ex vacuo dilatation the occipital horn of the RIGHT lateral ventricle. Moderate to severe ventriculomegaly, generally on the basis of global parenchymal brain volume loss as there is overall commensurate enlargement of cerebral sulci and  cerebellar folia. Disproportionate midbrain volume loss. Bilateral remote basal ganglia and thalamus lacunar infarcts. Moderate white matter changes. Basal cisterns are patent.  No paranasal sinus air-fluid levels. The mastoid air cells are well aerated. No skull fracture. Ocular globes and orbital contents are nonacute.  IMPRESSION: No acute intracranial process.  Moderate to severe brain volume loss, with suspected disproportionate midbrain atrophy.  Remote bilateral basal ganglia and thalamus lacunar infarcts. Remote RIGHT posterior cerebral artery territory infarct. Moderate white matter changes can be seen with chronic small vessel ischemic disease.   Electronically Signed   By: Elon Alas   On: 12/23/2014 02:26   US Renal  12/23/2014   CLINICAL DATA:  Acute renal failure. History of diabetes and hypertension.  EXAM: RENAL/URINARY TRACT ULTRASOUND COMPLETE  COMPARISON:  None.  FINDINGS: Right Kidney:  Length: 12.4 cm. Increased parenchymal echogenicity. No mass or stone. No hydronephrosis.  Left Kidney:  Length: 11.8 cm. Increased parenchymal echogenicity. 8 mm nonobstructing stone in the lower pole. No mass. No hydronephrosis.  Bladder:  Limited evaluation can bladder not well differentiated from adjacent ascites.  IMPRESSION: Findings consistent with medical renal disease with echogenic kidneys bilaterally. No hydronephrosis. Nonobstructing stone in the lower pole of the left kidney. No renal masses.  Ascites.   Electronically Signed   By: Lajean Manes M.D.   On: 12/23/2014 12:41   Dg Chest Port 1 View  12/22/2014   CLINICAL DATA:  Witnessed seizure. Choking sensation tonight. History of stroke, MI, chronic kidney disease, heart disease, ischemic cardiomyopathy.  EXAM: PORTABLE CHEST - 1 VIEW  COMPARISON:  10/22/2014  FINDINGS: Postoperative changes in the mediastinum. Mild cardiac enlargement with increased pulmonary vascularity. No evidence of significant edema or consolidation. No blunting of  costophrenic angles. No pneumothorax. Calcified and tortuous aorta.  IMPRESSION: Cardiac enlargement with mild pulmonary vascular congestion.   Electronically Signed   By: Lucienne Capers M.D.   On: 12/22/2014 23:36    Review of Systems  Respiratory: Negative for cough.   Cardiovascular: Negative for chest pain and orthopnea.  Gastrointestinal: Negative for nausea.       Patient difficulty swallowing and choking  Neurological: Positive for seizures.   Blood pressure 147/66, pulse 63, temperature  98.2 F (36.8 C), temperature source Oral, resp. rate 18, height 5' 6"  (1.676 m), weight 71.1 kg (156 lb 12 oz), SpO2 100 %. Physical Exam  Constitutional: No distress.  Eyes: Scleral icterus is present.  Neck: No JVD present.  Cardiovascular:  No murmur heard. Respiratory: No respiratory distress. She has no wheezes.  GI: She exhibits no distension. There is no tenderness.  Musculoskeletal: Normal range of motion. She exhibits no edema.  Neurological: She is alert.    Assessment/Plan: Problem #1 acute kidney injury superimposed on chronic. Presently patient is nonoliguric and her BUN and creatinine seems to be improving. The etiology seems to be secondary to prerenal syndrome. Problem #2 hypernatremia: Possibly lack of free water. Her sodium is 159 slightly better. Problem #3 history of seizure activity Problem #4 history of diabetes Problem # 5 hypertension: Her blood pressure is reasonably controlled Problem #6 history of CVA Problem #7 history of ischemic cardiomyopathy: Presently patient doesn't have any sign of fluid overload. Problem #8 anemia: Patient with history of GI bleeding and she has workup done before. Presently her hemoglobin and hematocrit is within her target goal. Patient has received blood transfusion before. Plan: We'll change her IV fluid to D5 water at 75 mL per hour We'll check her basic metabolic panel, phosphorus and CBC in the morning.  Tejon Gracie  S 12/24/2014, 7:36 AM

## 2014-12-24 NOTE — Progress Notes (Signed)
[facility] PROGRESS NOTE  [name] EVO:[id number] DOB: 1941-01-22 DOA: 12/22/2014 PCP: [name], MD  Assessment/Plan: 1. Acute on chronic kidney disease stage 4. Likely pre renal. Patient was taking ARB/demadex prior to admission. Continue IV fluids. Some improvement in renal function today. Renal ultrasound without obstruction. Appreciate nephrology assistance. 2. Poss Seizure. Patient had a spell after a choking episode at home. She was not post ictal and did not have any bladder/bowel incontinence. Although the patient does have a seizure disorder, it is unlikely that she had a seizure. Will continue to monitor. Continue Keppra 3. Hypernatremia. IV fluids changed to D5. Continue to monitor. 4. DM2. Continue sliding scale insulin 5. HTN. Stable 6. Dysphagia. Patient having difficulty swallowing better, but appears to tolerate food. Will request speech evaluation.  Code Status: full code Family Communication: discussed with daughter at the bedside Disposition Plan: discharge home   Consultants:  Nephrology  Procedures:    Antibiotics:    HPI/Subjective: Speech is difficult to comprehend. Daughter reports this is chronic issue. She often has difficulty swallowing water.   Objective: Filed Vitals:   12/24/14 0500  BP: 147/66  Pulse: 63  Temp: 98.2 F (36.8 C)  Resp: 18    Intake/Output Summary (Last 24 hours) at 12/24/14 1418 Last data filed at 12/24/14 0600  Gross per 24 hour  Intake 666.25 ml  Output   1000 ml  Net -333.75 ml   Filed Weights   12/22/14 2250 12/23/14 0347 12/24/14 0500  Weight: 78.472 kg (173 lb) 68.357 kg (150 lb 11.2 oz) 71.1 kg (156 lb 12 oz)    Exam:   General:  NAD  Cardiovascular: S1, s2 RRR  Respiratory: cta b  Abdomen: soft, nt, nd, bs+  Musculoskeletal: LE edema improved   Data Reviewed: Basic Metabolic Panel:  Recent Labs Lab 12/22/14 2340 12/23/14 0639 12/24/14 0633  NA 155* 160* 159*  K 3.9 4.0  3.5  CL 118* 118* 120*  CO2 27 28 31   GLUCOSE 191* 146* 178*  BUN 85* 80* 73*  CREATININE 4.29* 4.15* 3.74*  CALCIUM 7.8* 8.4 8.2*   Liver Function Tests:  Recent Labs Lab 12/23/14 0639  AST 35  ALT 19  ALKPHOS 119*  BILITOT 1.1  PROT 8.1  ALBUMIN 2.7*   No results for input(s): LIPASE, AMYLASE in the last 168 hours. No results for input(s): AMMONIA in the last 168 hours. CBC:  Recent Labs Lab 12/22/14 2340 12/23/14 0639 12/24/14 0633  WBC 4.6 5.7 4.2  NEUTROABS  --  4.1  --   HGB 10.3* 10.5* 10.6*  HCT 36.4 38.1 37.6  MCV 85.2 86.2 85.3  PLT 153 161 156   Cardiac Enzymes: No results for input(s): CKTOTAL, CKMB, CKMBINDEX, TROPONINI in the last 168 hours. BNP (last 3 results)  Recent Labs  03/15/14 1645 04/27/14 1536 06/09/14 1021  PROBNP 37134.0* 51631.0* 30358.0*   CBG:  Recent Labs Lab 12/23/14 0756 12/23/14 1134 12/23/14 1619 12/24/14 0732 12/24/14 1133  GLUCAP 127* 100* 111* 157* 140*    No results found for this or any previous visit (from the past 240 hour(s)).   Studies: Ct Head Wo Contrast  12/23/2014   CLINICAL DATA:  Witnessed seizure 4 hr ago. Subsequent unresponsive. History of seizures, stroke, diabetes, chronic kidney disease.  EXAM: CT HEAD WITHOUT CONTRAST  TECHNIQUE: Contiguous axial images were obtained from the base of the skull through the vertex without intravenous contrast.  COMPARISON:  CT of the head March 15, 2014  FINDINGS: No intraparenchymal hemorrhage, mass effect, midline shift or acute large vascular territory infarct. RIGHT occipital encephalomalacia with mild ex vacuo dilatation the occipital horn of the RIGHT lateral ventricle. Moderate to severe ventriculomegaly, generally on the basis of global parenchymal brain volume loss as there is overall commensurate enlargement of cerebral sulci and cerebellar folia. Disproportionate midbrain volume loss. Bilateral remote basal ganglia and thalamus lacunar infarcts. Moderate  white matter changes. Basal cisterns are patent.  No paranasal sinus air-fluid levels. The mastoid air cells are well aerated. No skull fracture. Ocular globes and orbital contents are nonacute.  IMPRESSION: No acute intracranial process.  Moderate to severe brain volume loss, with suspected disproportionate midbrain atrophy.  Remote bilateral basal ganglia and thalamus lacunar infarcts. Remote RIGHT posterior cerebral artery territory infarct. Moderate white matter changes can be seen with chronic small vessel ischemic disease.   Electronically Signed   By: Elon Alas   On: 12/23/2014 02:26   US Renal  12/23/2014   CLINICAL DATA:  Acute renal failure. History of diabetes and hypertension.  EXAM: RENAL/URINARY TRACT ULTRASOUND COMPLETE  COMPARISON:  None.  FINDINGS: Right Kidney:  Length: 12.4 cm. Increased parenchymal echogenicity. No mass or stone. No hydronephrosis.  Left Kidney:  Length: 11.8 cm. Increased parenchymal echogenicity. 8 mm nonobstructing stone in the lower pole. No mass. No hydronephrosis.  Bladder:  Limited evaluation can bladder not well differentiated from adjacent ascites.  IMPRESSION: Findings consistent with medical renal disease with echogenic kidneys bilaterally. No hydronephrosis. Nonobstructing stone in the lower pole of the left kidney. No renal masses.  Ascites.   Electronically Signed   By: Lajean Manes M.D.   On: 12/23/2014 12:41   Dg Chest Port 1 View  12/22/2014   CLINICAL DATA:  Witnessed seizure. Choking sensation tonight. History of stroke, MI, chronic kidney disease, heart disease, ischemic cardiomyopathy.  EXAM: PORTABLE CHEST - 1 VIEW  COMPARISON:  10/22/2014  FINDINGS: Postoperative changes in the mediastinum. Mild cardiac enlargement with increased pulmonary vascularity. No evidence of significant edema or consolidation. No blunting of costophrenic angles. No pneumothorax. Calcified and tortuous aorta.  IMPRESSION: Cardiac enlargement with mild pulmonary  vascular congestion.   Electronically Signed   By: Lucienne Capers M.D.   On: 12/22/2014 23:36    Scheduled Meds: . calcitRIOL  0.25 mcg Oral Daily  . donepezil  10 mg Oral QHS  . enoxaparin (LOVENOX) injection  30 mg Subcutaneous Q24H  . folic acid  1 mg Oral q morning - 10a  . hydrALAZINE  75 mg Oral 3 times per day  . insulin aspart  0-9 Units Subcutaneous TID WC  . isosorbide mononitrate  15 mg Oral q morning - 10a  . levETIRAcetam  500 mg Oral BID  . linagliptin  5 mg Oral q morning - 10a  . metoprolol succinate  50 mg Oral q morning - 10a  . pantoprazole  40 mg Oral q morning - 10a  . pravastatin  20 mg Oral QHS  . sodium bicarbonate  650 mg Oral TID  . sodium chloride  3 mL Intravenous Q12H   Continuous Infusions: . dextrose 75 mL/hr at 12/24/14 1058    Active Problems:   Hypertension   Diabetes mellitus   Dementia   Chronic kidney disease (CKD), stage IV (severe)   Seizure   Acute renal failure   Anemia   Hypernatremia   Dysphagia    Time spent: 38mins    Haywood Meinders  [facility] Pager [phone]. If 7PM-7AM, please  contact night-coverage at [email or web address], password [facility] 12/24/2014, 2:18 PM  LOS: 2 days

## 2014-12-25 ENCOUNTER — Ambulatory Visit (HOSPITAL_COMMUNITY): Payer: Medicare Other

## 2014-12-25 ENCOUNTER — Other Ambulatory Visit (HOSPITAL_COMMUNITY): Payer: Medicare Other

## 2014-12-25 ENCOUNTER — Encounter (HOSPITAL_COMMUNITY): Payer: Medicare Other

## 2014-12-25 ENCOUNTER — Ambulatory Visit (HOSPITAL_COMMUNITY): Payer: Medicare Other | Admitting: Oncology

## 2014-12-25 ENCOUNTER — Inpatient Hospital Stay (HOSPITAL_COMMUNITY)
Admission: EM | Admit: 2014-12-25 | Discharge: 2014-12-25 | Disposition: A | Discharge status: Home / Self Care | Payer: Medicare Other | Source: Home / Self Care | Attending: Internal Medicine | Admitting: Internal Medicine

## 2014-12-25 LAB — BASIC METABOLIC PANEL
ANION GAP: 8 (ref 5–15)
BUN: 62 mg/dL — AB (ref 6–23)
CO2: 26 mmol/L (ref 19–32)
Calcium: 8 mg/dL — ABNORMAL LOW (ref 8.4–10.5)
Chloride: 119 mmol/L — ABNORMAL HIGH (ref 96–112)
Creatinine, Ser: 3.64 mg/dL — ABNORMAL HIGH (ref 0.50–1.10)
GFR calc non Af Amer: 11 mL/min — ABNORMAL LOW (ref >90)
GFR, EST AFRICAN AMERICAN: 13 mL/min — AB (ref >90)
Glucose, Bld: 163 mg/dL — ABNORMAL HIGH (ref 70–99)
POTASSIUM: 3.8 mmol/L (ref 3.5–5.1)
Sodium: 153 mmol/L — ABNORMAL HIGH (ref 135–145)

## 2014-12-25 LAB — CBC
HEMATOCRIT: 38 % (ref 36.0–46.0)
HEMOGLOBIN: 10.8 g/dL — AB (ref 12.0–15.0)
MCH: 24.3 pg — ABNORMAL LOW (ref 26.0–34.0)
MCHC: 28.4 g/dL — AB (ref 30.0–36.0)
MCV: 85.4 fL (ref 78.0–100.0)
Platelets: 114 10*3/uL — ABNORMAL LOW (ref 150–400)
RBC: 4.45 MIL/uL (ref 3.87–5.11)
RDW: 27.8 % — AB (ref 11.5–15.5)
WBC: 4.4 10*3/uL (ref 4.0–10.5)

## 2014-12-25 LAB — GLUCOSE, CAPILLARY
GLUCOSE-CAPILLARY: 135 mg/dL — AB (ref 70–99)
GLUCOSE-CAPILLARY: 149 mg/dL — AB (ref 70–99)
GLUCOSE-CAPILLARY: 153 mg/dL — AB (ref 70–99)
GLUCOSE-CAPILLARY: 153 mg/dL — AB (ref 70–99)
Glucose-Capillary: 147 mg/dL — ABNORMAL HIGH (ref 70–99)
Glucose-Capillary: 147 mg/dL — ABNORMAL HIGH (ref 70–99)
Glucose-Capillary: 151 mg/dL — ABNORMAL HIGH (ref 70–99)

## 2014-12-25 LAB — PHOSPHORUS: Phosphorus: 4.3 mg/dL (ref 2.3–4.6)

## 2014-12-25 LAB — HEMOGLOBIN A1C
HEMOGLOBIN A1C: 7.5 % — AB (ref 4.8–5.6)
Mean Plasma Glucose: 169 mg/dL

## 2014-12-25 MED ORDER — FUROSEMIDE 10 MG/ML IJ SOLN
40.0000 mg | Freq: Two times a day (BID) | INTRAMUSCULAR | Status: DC
  Administered 2014-12-25 – 2014-12-28 (×6): 40 mg via INTRAVENOUS
  Filled 2014-12-25 (×7): qty 4

## 2014-12-25 NOTE — Progress Notes (Signed)
EEG completed; results pending.    

## 2014-12-25 NOTE — Progress Notes (Signed)
[facility] PROGRESS NOTE  Ann-Marie Kluge Henriquez LYY:[id number] DOB: 02-01-1941 DOA: 12/22/2014 PCP: [name], MD  Assessment/Plan: 1. Acute on chronic kidney disease stage 4. Likely pre renal. Patient was taking ARB/demadex prior to admission. Continue IV fluids. Some improvement in renal function today. Renal ultrasound without obstruction. Appreciate nephrology assistance. 2. Poss Seizure. Patient had a spell after a choking episode at home. She was not post ictal and did not have any bladder/bowel incontinence. Although the patient does have a seizure disorder, it is unlikely that she had a seizure. Will continue to monitor. Continue Keppra. EEG has been done with report pending 3. Hypernatremia. IV fluids changed to D5. Mild improvement. Continue to monitor. 4. DM2. Continue sliding scale insulin 5. HTN. Stable 6. Dysphagia. Seen by speech therapy and recommendations were mechanical soft diet with liquids..  Code Status: full code Family Communication: discussed with daughter at the bedside Disposition Plan: discharge home   Consultants:  Nephrology  Speech therapy  Procedures:    Antibiotics:    HPI/Subjective: Does not have any new complaints.  Objective: Filed Vitals:   12/25/14 1544  BP: 128/48  Pulse: 68  Temp: 98.5 F (36.9 C)  Resp: 18    Intake/Output Summary (Last 24 hours) at 12/25/14 1812 Last data filed at 12/25/14 1622  Gross per 24 hour  Intake 1478.75 ml  Output    700 ml  Net 778.75 ml   Filed Weights   12/23/14 0347 12/24/14 0500 12/25/14 0423  Weight: 68.357 kg (150 lb 11.2 oz) 71.1 kg (156 lb 12 oz) 71.623 kg (157 lb 14.4 oz)    Exam:   General:  NAD  Cardiovascular: S1, s2 RRR  Respiratory: cta b  Abdomen: soft, nt, nd, bs+  Musculoskeletal: LE edema improved   Data Reviewed: Basic Metabolic Panel:  Recent Labs Lab 12/22/14 2340 12/23/14 0639 12/24/14 0633 12/25/14 0712  NA 155* 160* 159* 153*  K 3.9 4.0 3.5  3.8  CL 118* 118* 120* 119*  CO2 27 28 31 26   GLUCOSE 191* 146* 178* 163*  BUN 85* 80* 73* 62*  CREATININE 4.29* 4.15* 3.74* 3.64*  CALCIUM 7.8* 8.4 8.2* 8.0*  PHOS  --   --   --  4.3   Liver Function Tests:  Recent Labs Lab 12/23/14 0639  AST 35  ALT 19  ALKPHOS 119*  BILITOT 1.1  PROT 8.1  ALBUMIN 2.7*   No results for input(s): LIPASE, AMYLASE in the last 168 hours. No results for input(s): AMMONIA in the last 168 hours. CBC:  Recent Labs Lab 12/22/14 2340 12/23/14 0639 12/24/14 0633 12/25/14 0712  WBC 4.6 5.7 4.2 4.4  NEUTROABS  --  4.1  --   --   HGB 10.3* 10.5* 10.6* 10.8*  HCT 36.4 38.1 37.6 38.0  MCV 85.2 86.2 85.3 85.4  PLT 153 161 156 114*   Cardiac Enzymes: No results for input(s): CKTOTAL, CKMB, CKMBINDEX, TROPONINI in the last 168 hours. BNP (last 3 results)  Recent Labs  03/15/14 1645 04/27/14 1536 06/09/14 1021  PROBNP 37134.0* 51631.0* 30358.0*   CBG:  Recent Labs Lab 12/25/14 0023 12/25/14 0429 12/25/14 0759 12/25/14 1136 12/25/14 1620  GLUCAP 147* 147* 151* 149* 153*    No results found for this or any previous visit (from the past 240 hour(s)).   Studies: No results found.  Scheduled Meds: . calcitRIOL  0.25 mcg Oral Daily  . donepezil  10 mg Oral QHS  . enoxaparin (LOVENOX) injection  30 mg  Subcutaneous Q24H  . folic acid  1 mg Oral q morning - 10a  . furosemide  40 mg Intravenous BID  . hydrALAZINE  75 mg Oral 3 times per day  . insulin aspart  0-9 Units Subcutaneous TID WC  . isosorbide mononitrate  15 mg Oral q morning - 10a  . levETIRAcetam  500 mg Oral BID  . linagliptin  5 mg Oral q morning - 10a  . metoprolol succinate  50 mg Oral q morning - 10a  . pantoprazole  40 mg Oral q morning - 10a  . pravastatin  20 mg Oral QHS  . sodium bicarbonate  650 mg Oral TID  . sodium chloride  3 mL Intravenous Q12H   Continuous Infusions: . dextrose 75 mL/hr at 12/25/14 0018    Active Problems:   Hypertension    Diabetes mellitus   Dementia   Chronic kidney disease (CKD), stage IV (severe)   Seizure   Acute renal failure   Anemia   Hypernatremia   Dysphagia    Time spent: 3mins    [name],[name]  [facility] Pager [phone]. If 7PM-7AM, please contact night-coverage at [email or web address], password [facility] 12/25/2014, 6:12 PM  LOS: 3 days

## 2014-12-25 NOTE — Progress Notes (Signed)
Subjective: Interval History: has no complaint of nausea or vomiting. She denies also any difficulty breathing. Presently she is eating her lunch..  Objective: Vital signs in last 24 hours: Temp:  [98 F (36.7 C)-98.3 F (36.8 C)] 98.3 F (36.8 C) (02/01 0423) Pulse Rate:  [60-66] 65 (02/01 0423) Resp:  [16-18] 18 (02/01 0423) BP: (134-146)/(52-71) 146/58 mmHg (02/01 0644) SpO2:  [98 %-100 %] 100 % (02/01 0423) Weight:  [71.623 kg (157 lb 14.4 oz)] 71.623 kg (157 lb 14.4 oz) (02/01 0423) Weight change: 0.523 kg (1 lb 2.5 oz)  Intake/Output from previous day: 01/31 0701 - 02/01 0700 In: 1478.8 [I.V.:1478.8] Out: 750 [Urine:750] Intake/Output this shift:    General appearance: alert, cooperative and no distress Resp: wheezes bilaterally Cardio: regular rate and rhythm, S1, S2 normal, no murmur, click, rub or gallop GI: soft, non-tender; bowel sounds normal; no masses,  no organomegaly Extremities: extremities normal, atraumatic, no cyanosis or edema  Lab Results:  Recent Labs  12/24/14 0633 12/25/14 0712  WBC 4.2 4.4  HGB 10.6* 10.8*  HCT 37.6 38.0  PLT 156 114*   BMET:  Recent Labs  12/24/14 0633 12/25/14 0712  NA 159* 153*  K 3.5 3.8  CL 120* 119*  CO2 31 26  GLUCOSE 178* 163*  BUN 73* 62*  CREATININE 3.74* 3.64*  CALCIUM 8.2* 8.0*   No results for input(s): PTH in the last 72 hours. Iron Studies: No results for input(s): IRON, TIBC, TRANSFERRIN, FERRITIN in the last 72 hours.  Studies/Results: No results found.  I have reviewed the patient's current medications.  Assessment/Plan: Problem #1 acute kidney injury possibly secondary to prerenal syndrome. Presently her BUN and creatinine is improving. Patient is non-oliguric. Problem #2 hypernatremia: Because of lack of free water. Her sodium is 153 also improving. Patient presently seems to be more alert. Problem #3 hypertension: Her blood pressure is reasonably controlled Problem #4 chronic renal  failure stage IV. Presently patient denies any nausea or vomiting. Problem #5 diabetes. Problem 6 history of seizure disorder Problem #7 metabolic bone disease: Her calcium is in  Range Problem #8 anemia: Her hemoglobin and hematocrit is within our target goal. Plan: Continue with hydration We'll start patient on Lasix 40 mg IV twice a day We'll check her basic metabolic panel in the morning.    LOS: 3 days   Nailani Full S 12/25/2014,12:50 PM

## 2014-12-25 NOTE — Progress Notes (Signed)
UR chart review completed.  

## 2014-12-25 NOTE — Care Management Note (Signed)
    Page 1 of 2   12/29/2014     10:48:48 AM CARE MANAGEMENT NOTE 12/29/2014  Patient:  ASHIYA, KINKEAD   Account Number:  [id number]  Date Initiated:  12/25/2014  Documentation initiated by:  Theophilus Kinds  Subjective/Objective Assessment:   Pt admitted from home with acute renal failure. Pt lives with her daughter and will return home at discharge.     Action/Plan:   Cm attempted to call daughter per pts request to discuss discharge plans. Unable to reach. Wil continue to follow. SLP consult pending.   Anticipated DC Date:  12/26/2014   Anticipated DC Plan:  [place]  CM consult      [facility] Choice  HOME HEALTH   Choice offered to / List presented to:  C-4 Adult Children        HH arranged  HH-1 RN  [place].   Status of service:  Completed, signed off Medicare Important Message given?  YES (If response is "NO", the following Medicare IM given date fields will be blank) Date Medicare IM given:  12/29/2014 Medicare IM given by:  Theophilus Kinds Date Additional Medicare IM given:   Additional Medicare IM given by:    Discharge Disposition:  [place]  Per UR Regulation:    If discussed at Long Length of Stay Meetings, dates discussed:   12/28/2014    Comments:  12/29/14 [place], RN BSN CM PT recommends SNF for pt now. Pts daughter, [name] is agreeable. CSW is aware and will start bed search. If pt discharges home over the weekend, weekend staff can call [facility] and notify them of [facility].  12/25/14 [place], RN BSN CM CM spoke with pts daughter, [name], about discharge disposition. Daughter stated that pt has been fairly independent with ADL's. Pt does have a walker if needed. Pt also active with [facility]. Daughter stated that pt would return home at discharge.  12/25/14 [place], RN BSN CM

## 2014-12-25 NOTE — Evaluation (Signed)
Clinical/Bedside Swallow Evaluation Patient Details  Name: [name] MRN: [id number] DOB: [birth year]  Today's Date: 12/25/2014 Time: 11:50 AM  - 12:15 PM    Past Medical History:  Past Medical History  Diagnosis Date  . Type 2 diabetes mellitus   . Essential hypertension, benign   . History of stroke     Previously on Coumadin  . MI, old     Reported 84  . Seizure disorder   . Coronary atherosclerosis of native coronary artery     Multivessel status post CABG 2002  . History of GI bleed     Erosive gastritis and duodenitis by EGD 2002  . Mixed hyperlipidemia   . Ischemic cardiomyopathy     LVEF 40-45% March 2013  . Dementia   . Stroke   . Seizures   . CKD (chronic kidney disease) stage 4, GFR 15-29 ml/min   . CHF (congestive heart failure)    Past Surgical History:  Past Surgical History  Procedure Laterality Date  . Coronary artery bypass graft  July 2002    LIMA to LAD, SVG to OM1, SVG to OM 2, SVG to RCA  . Tee without cardioversion  01/28/2012    Procedure: TRANSESOPHAGEAL ECHOCARDIOGRAM (TEE);  Surgeon: Lelon Perla, MD;  Location: [facility] ENDOSCOPY;  Service: Cardiovascular;  Laterality: [name];  . Colonoscopy  2002  . Esophagogastroduodenoscopy  2002  . Esophagogastroduodenoscopy [name] 01/18/2014    BPZ:WCHEN hiatal hernia. Abnormal gastric and duodenal bulbar mucosa of uncertain significance - status post gastric bx (chronic gastritis/H.pylori +  . Colonoscopy [name] 06/25/2014    Dr. Laural Golden: tubular adenoma, pandiverticulosis.   . Agile capsule [name] 07/06/2014    Procedure: AGILE CAPSULE;  Surgeon: Danie Binder, MD;  Location: [facility] ENDO SUITE;  Service: Endoscopy;  Laterality: [name];  730  . Esophagogastroduodenoscopy [name] 07/20/2014    Dr. Gala Romney: tiny gastric polyps not manipulated. minimal pigmentation of duodenal bulb likely not significant  . Givens capsule study [name] 07/20/2014    incomplete   HPI:  [name] is a 74 yo woman who was admitted after possible  seizure at home while drinking water. She has acute on chronic kidney disease stage 4. Likely pre renal. Patient was taking ARB/demadex prior to admission. Continue IV fluids. Some improvement in renal function today. Renal ultrasound without obstruction. Appreciate nephrology assistance. Poss Seizure. Patient had a spell after a choking episode at home. She was not post ictal and did not have any bladder/bowel incontinence. Although the patient does have a seizure disorder, it is unlikely that she had a seizure. Will continue to monitor. Continue Keppra. Pt with hypernatremia, DM2, and sliding scale. Daughter reports that pt gets "strangled" on water, but not soda. SLP asked to evaluate swallow function.    Assessment/Recommendations/Treatment Plan    SLP Assessment Clinical Impression Statement: [name] presents with mild dysarthria. She denies difficulty swallowing, however when I spoke with her daughter on the phone, she reports that she has difficulty swallowing water. Pt presents with mild global weakness (facial and lingual) and is missing some dentition. Swallowing is audible when pt taking sips of water (sounds like some nasopharyngeal insufficiency). No coughing elicited with cup sips, however occasional cough after straw sips water. I spoke with daughter on the phone who reports that she frequently coughs when drinking water at home, but does not get "strangled" with soda. Pt likely with some sensorimotor based dsyphagia (delay in swallow initiation, able to "sense" carbonated beverages more easily), however  taking small cup sips of water appear to mitigate coughing episodes. Recommend D3/mech soft due to dentition and thin liquids by cup sip only. No straws. Pt, daughter, and RN in agreement with plan. SLP will follow for diet tolerance.  Risk for Aspiration: Mild Other Related Risk Factors: History of dysphagia  Swallow Evaluation Recommendations Diet Recommendations: Dysphagia 3  (Mechanical Soft), Thin liquid Liquid Administration via: Cup, No straw Medication Administration: Crushed with puree Supervision: Patient able to self feed, Intermittent supervision to cue for compensatory strategies Compensations: Slow rate, Small sips/bites Postural Changes and/or Swallow Maneuvers: Seated upright 90 degrees, Upright 30-60 min after meal Oral Care Recommendations: Oral care BID Other Recommendations: Clarify dietary restrictions Follow up Recommendations: None  Treatment Plan Treatment Plan Recommendations: Therapy as outlined in treatment plan below Speech Therapy Frequency (ACUTE ONLY): min 2x/week Treatment Duration: 1 week Interventions: Aspiration precaution training, Compensatory techniques, Patient/family education, Diet toleration management by SLP  Prognosis Prognosis for Safe Diet Advancement: Good  Individuals Consulted Consulted and Agree with Results and Recommendations: Patient, Family member/caregiver, MD, RN Family Member Consulted: daughter, [name]  Swallowing Goals    Pt will demonstrate safe and efficient consumption of least restrictive diet with use of strategies as needed.  Swallow Study Prior Functional Status   Lives at home with her daughter  General  Date of Onset: 12/22/14 HPI: [name] is a 74 yo woman who was admitted after possible seizure at home while drinking water. She has acute on chronic kidney disease stage 4. Likely pre renal. Patient was taking ARB/demadex prior to admission. Continue IV fluids. Some improvement in renal function today. Renal ultrasound without obstruction. Appreciate nephrology assistance. Poss Seizure. Patient had a spell after a choking episode at home. She was not post ictal and did not have any bladder/bowel incontinence. Although the patient does have a seizure disorder, it is unlikely that she had a seizure. Will continue to monitor. Continue Keppra. Pt with hypernatremia, DM2, and sliding  scale. Daughter reports that pt gets "strangled" on water, but not soda. SLP asked to evaluate swallow function.  Type of Study: Bedside swallow evaluation Previous Swallow Assessment: MBS 2013- D3/thin Diet Prior to this Study: NPO Temperature Spikes Noted: No Respiratory Status: Room air History of Recent Intubation: No Behavior/Cognition: Alert, Cooperative, Pleasant mood Oral Cavity - Dentition: Poor condition Self-Feeding Abilities: Needs set up Patient Positioning: Upright in bed Baseline Vocal Quality: Clear Volitional Cough: Strong Volitional Swallow: Able to elicit  Oral Motor/Sensory Function  Overall Oral Motor/Sensory Function: Impaired Labial ROM: Within Functional Limits Labial Symmetry: Within Functional Limits Labial Strength: Reduced Labial Sensation: Within Functional Limits Lingual ROM: Reduced right;Reduced left Lingual Symmetry: Within Functional Limits Lingual Strength: Reduced Lingual Sensation: Within Functional Limits Facial ROM: Within Functional Limits Facial Symmetry: Within Functional Limits Facial Strength: Within Functional Limits Facial Sensation: Within Functional Limits Velum: Within Functional Limits Mandible: Within Functional Limits  Consistency Results  Ice Chips Ice chips: Within functional limits Presentation: Spoon  Thin Liquid Thin Liquid: Impaired Presentation: Cup;Self Fed;Straw Pharyngeal  Phase Impairments: Cough - Immediate Other Comments: coughing when using a straw on 50% of trials with water  Nectar Thick Liquid Nectar Thick Liquid: Not tested  Honey Thick Liquid Honey Thick Liquid: Not tested  Puree Puree: Within functional limits Presentation: Spoon  Solid Solid: Within functional limits Presentation: Self Fed  Thank you,  Genene Churn, [place]  Abhijay Morriss 12/25/2014,12:56 PM

## 2014-12-26 ENCOUNTER — Inpatient Hospital Stay (HOSPITAL_COMMUNITY): Payer: Medicare Other

## 2014-12-26 LAB — GLUCOSE, CAPILLARY
Glucose-Capillary: 165 mg/dL — ABNORMAL HIGH (ref 70–99)
Glucose-Capillary: 150 mg/dL — ABNORMAL HIGH (ref 70–99)
Glucose-Capillary: 222 mg/dL — ABNORMAL HIGH (ref 70–99)
Glucose-Capillary: 203 mg/dL — ABNORMAL HIGH (ref 70–99)
Glucose-Capillary: 149 mg/dL — ABNORMAL HIGH (ref 70–99)

## 2014-12-26 LAB — BASIC METABOLIC PANEL
Anion gap: 8 (ref 5–15)
BUN: 62 mg/dL — ABNORMAL HIGH (ref 6–23)
CHLORIDE: 110 mmol/L (ref 96–112)
CO2: 30 mmol/L (ref 19–32)
Calcium: 7.8 mg/dL — ABNORMAL LOW (ref 8.4–10.5)
Creatinine, Ser: 4.1 mg/dL — ABNORMAL HIGH (ref 0.50–1.10)
GFR calc non Af Amer: 10 mL/min — ABNORMAL LOW (ref >90)
GFR, EST AFRICAN AMERICAN: 11 mL/min — AB (ref >90)
GLUCOSE: 168 mg/dL — AB (ref 70–99)
POTASSIUM: 3.5 mmol/L (ref 3.5–5.1)
Sodium: 148 mmol/L — ABNORMAL HIGH (ref 135–145)

## 2014-12-26 NOTE — Procedures (Signed)
Objective Swallowing Evaluation: Modified Barium Swallowing Study  Patient Details  Name: [name] MRN: [id number] Date of Birth: [birth year]  Today's Date: 12/26/2014 Time: SLP Start Time (ACUTE ONLY): 1315-SLP Stop Time (ACUTE ONLY): 1345 SLP Time Calculation (min) (ACUTE ONLY): 30 min  Past Medical History:  Past Medical History  Diagnosis Date  . Type 2 diabetes mellitus   . Essential hypertension, benign   . History of stroke     Previously on Coumadin  . MI, old     Reported 74  . Seizure disorder   . Coronary atherosclerosis of native coronary artery     Multivessel status post CABG 2002  . History of GI bleed     Erosive gastritis and duodenitis by EGD 2002  . Mixed hyperlipidemia   . Ischemic cardiomyopathy     LVEF 40-45% March 2013  . Dementia   . Stroke   . Seizures   . CKD (chronic kidney disease) stage 4, GFR 15-29 ml/min   . CHF (congestive heart failure)    Past Surgical History:  Past Surgical History  Procedure Laterality Date  . Coronary artery bypass graft  July 2002    LIMA to LAD, SVG to OM1, SVG to OM 2, SVG to RCA  . Tee without cardioversion  01/28/2012    Procedure: TRANSESOPHAGEAL ECHOCARDIOGRAM (TEE);  Surgeon: Lelon Perla, MD;  Location: [facility] ENDOSCOPY;  Service: Cardiovascular;  Laterality: [name];  . Colonoscopy  2002  . Esophagogastroduodenoscopy  2002  . Esophagogastroduodenoscopy [name] 01/18/2014    PVX:YIAXK hiatal hernia. Abnormal gastric and duodenal bulbar mucosa of uncertain significance - status post gastric bx (chronic gastritis/H.pylori +  . Colonoscopy [name] 06/25/2014    Dr. Laural Golden: tubular adenoma, pandiverticulosis.   . Agile capsule [name] 07/06/2014    Procedure: AGILE CAPSULE;  Surgeon: Danie Binder, MD;  Location: [facility] ENDO SUITE;  Service: Endoscopy;  Laterality: [name];  730  . Esophagogastroduodenoscopy [name] 07/20/2014    Dr. Gala Romney: tiny gastric polyps not manipulated. minimal pigmentation of duodenal bulb likely not  significant  . Givens capsule study [name] 07/20/2014    incomplete   HPI:  HPI: [name] is a 74 yo woman who was admitted after possible seizure at home while drinking water. She has acute on chronic kidney disease stage 4. Likely pre renal. Patient was taking ARB/demadex prior to admission. Continue IV fluids. Some improvement in renal function today. Renal ultrasound without obstruction. Appreciate nephrology assistance. Poss Seizure. Patient had a spell after a choking episode at home. She was not post ictal and did not have any bladder/bowel incontinence. Although the patient does have a seizure disorder, it is unlikely that she had a seizure. Will continue to monitor. Continue Keppra. Pt with hypernatremia, DM2, and sliding scale. Daughter reports that pt gets "strangled" on water, but not soda. SLP asked to evaluate swallow function.   No Data Recorded  Assessment / Plan / Recommendation CHL IP CLINICAL IMPRESSIONS 12/26/2014  Dysphagia Diagnosis Mild oral phase dysphagia;Moderate oral phase dysphagia;Mild pharyngeal phase dysphagia  Clinical impression [name] was seen for MBSS up in hausted chair this afternoon to help determine safest diet and use of strategies as needed. She presents with a 74/moderate oropharyngeal sensorimotor based dysphagia characterized by delayed and inefficient oral transit with both solids and liquids, lingual pumping, decreased bolus cohesiveness, and decreased lingual to palatal contact. This results in spillover of bolus to valleculae and pyriforms and palatal and lingual residuals after the swallow which then  pool in pharynx. Pharyngeal phase is marked by a delay in swallow initiation as stated above, with swallow trigger after spilling to pyriforms. There is no pharyngeal residue after the swallow until oral residuals spill to the valleculae and pyriforms to which pt does not appear sensate. Hyolaryngeal excursion appears adequate. The oral residuals that  spill after the primary swallow are what become problematic. Pt presents with mild wet vocal quality/congestion but has poor awareness of the same and requires cues to "swallow again". Pt demonstrated only flash penetration with use of straw. Recommend D3/mech soft with thin liquids via small cup sip. Pt needs to swallow 2x for each bite/sip. It is often difficult for her to initiate a second swallow, but is able to do after some delay. When pt presents with congested/wet breathing/voice, cue pt to "swallow again" to clear pharyngeal residue. SLP will follow while inpatient for diet tolerance, pt/caregiver education, and implementation of compensatory strategies.      CHL IP TREATMENT RECOMMENDATION 12/26/2014  Treatment Plan Recommendations Therapy as outlined in treatment plan below     CHL IP DIET RECOMMENDATION 12/26/2014  Diet Recommendations Dysphagia 3 (Mechanical Soft);Thin liquid  Liquid Administration via Cup;No straw  Medication Administration Crushed with puree  Compensations Slow rate;Small sips/bites;Multiple dry swallows after each bite/sip  Postural Changes and/or Swallow Maneuvers Seated upright 90 degrees;Upright 30-60 min after meal     CHL IP OTHER RECOMMENDATIONS 12/26/2014  Recommended Consults (None)  Oral Care Recommendations Oral care BID  Other Recommendations Clarify dietary restrictions     CHL IP FOLLOW UP RECOMMENDATIONS 12/26/2014  Follow up Recommendations None     CHL IP FREQUENCY AND DURATION 12/26/2014  Speech Therapy Frequency (ACUTE ONLY) min 2x/week  Treatment Duration 1 week     Pertinent Vitals/Pain VSS    SLP Swallow Goals CHL IP SWALLOW STUDY GOALS 03/17/2014  Patient will consume recommended diet without observed clinical signs of aspiration with Independent assistance  Swallow Study Goal #1 - Progress Met  Patient will utilize recommended strategies during swallow to increase swallowing safety with Independent assistance  Swallow Study Goal #2 -  Progress Met  Goal #3 (None)  Swallow Study Goal #3 - Progress (None)  Goal #4 (None)  Swallow Study Goal #4 - Progress (None)    No flowsheet data found.    CHL IP REASON FOR REFERRAL 12/26/2014  Reason for Referral Objectively evaluate swallowing function     CHL IP ORAL PHASE 12/26/2014  Lips (None)  Tongue (None)  Mucous membranes (None)  Nutritional status (None)  Other (None)  Oxygen therapy (None)  Oral Phase Impaired  Oral - Pudding Teaspoon (None)  Oral - Pudding Cup (None)  Oral - Honey Teaspoon (None)  Oral - Honey Cup (None)  Oral - Honey Syringe (None)  Oral - Nectar Teaspoon (None)  Oral - Nectar Cup Incomplete tongue to palate contact;Lingual/palatal residue;Delayed oral transit  Oral - Nectar Straw (None)  Oral - Nectar Syringe (None)  Oral - Ice Chips (None)  Oral - Thin Teaspoon Delayed oral transit;Incomplete tongue to palate contact  Oral - Thin Cup Delayed oral transit;Piecemeal swallowing;Lingual/palatal residue;Weak lingual manipulation;Lingual pumping;Holding of bolus  Oral - Thin Straw Lingual pumping;Incomplete tongue to palate contact;Lingual/palatal residue  Oral - Thin Syringe (None)  Oral - Puree Lingual pumping;Reduced posterior propulsion;Holding of bolus;Delayed oral transit  Oral - Mechanical Soft (None)  Oral - Regular Weak lingual manipulation;Lingual pumping;Reduced posterior propulsion;Holding of bolus;Lingual/palatal residue;Piecemeal swallowing;Delayed oral transit  Oral - Multi-consistency (None)  Oral -  Pill (None)  Oral Phase - Comment (None)      CHL IP PHARYNGEAL PHASE 12/26/2014  Pharyngeal Phase Impaired  Pharyngeal - Pudding Teaspoon (None)  Penetration/Aspiration details (pudding teaspoon) (None)  Pharyngeal - Pudding Cup (None)  Penetration/Aspiration details (pudding cup) (None)  Pharyngeal - Honey Teaspoon (None)  Penetration/Aspiration details (honey teaspoon) (None)  Pharyngeal - Honey Cup (None)   Penetration/Aspiration details (honey cup) (None)  Pharyngeal - Honey Syringe (None)  Penetration/Aspiration details (honey syringe) (None)  Pharyngeal - Nectar Teaspoon (None)  Penetration/Aspiration details (nectar teaspoon) (None)  Pharyngeal - Nectar Cup Delayed swallow initiation;Premature spillage to valleculae;Premature spillage to pyriform sinuses;Pharyngeal residue - valleculae;Pharyngeal residue - pyriform sinuses  Penetration/Aspiration details (nectar cup) (None)  Pharyngeal - Nectar Straw (None)  Penetration/Aspiration details (nectar straw) (None)  Pharyngeal - Nectar Syringe (None)  Penetration/Aspiration details (nectar syringe) (None)  Pharyngeal - Ice Chips (None)  Penetration/Aspiration details (ice chips) (None)  Pharyngeal - Thin Teaspoon Delayed swallow initiation;Premature spillage to valleculae;Premature spillage to pyriform sinuses  Penetration/Aspiration details (thin teaspoon) (None)  Pharyngeal - Thin Cup Delayed swallow initiation;Premature spillage to valleculae;Premature spillage to pyriform sinuses;Pharyngeal residue - valleculae;Pharyngeal residue - pyriform sinuses  Penetration/Aspiration details (thin cup) (None)  Pharyngeal - Thin Straw Delayed swallow initiation;Premature spillage to valleculae;Premature spillage to pyriform sinuses;Penetration/Aspiration during swallow;Pharyngeal residue - valleculae;Pharyngeal residue - pyriform sinuses  Penetration/Aspiration details (thin straw) Material enters airway, remains ABOVE vocal cords then ejected out  Pharyngeal - Thin Syringe (None)  Penetration/Aspiration details (thin syringe') (None)  Pharyngeal - Puree Delayed swallow initiation;Premature spillage to valleculae  Penetration/Aspiration details (puree) (None)  Pharyngeal - Mechanical Soft (None)  Penetration/Aspiration details (mechanical soft) (None)  Pharyngeal - Regular Delayed swallow initiation;Premature spillage to valleculae;Premature spillage  to pyriform sinuses  Penetration/Aspiration details (regular) (None)  Pharyngeal - Multi-consistency (None)  Penetration/Aspiration details (multi-consistency) (None)  Pharyngeal - Pill (None)  Penetration/Aspiration details (pill) (None)  Pharyngeal Comment (None)     CHL IP CERVICAL ESOPHAGEAL PHASE 12/26/2014  Cervical Esophageal Phase WFL  Pudding Teaspoon (None)  Pudding Cup (None)  Honey Teaspoon (None)  Honey Cup (None)  Honey Syringe (None)  Nectar Teaspoon (None)  Nectar Cup (None)  Nectar Straw (None)  Nectar Syringe (None)  Thin Teaspoon (None)  Thin Cup (None)  Thin Straw (None)  Thin Syringe (None)  Cervical Esophageal Comment (None)    No flowsheet data found.        Thank you,  Genene Churn, [place]  [place] 12/26/2014, 7:22 PM

## 2014-12-26 NOTE — Progress Notes (Signed)
Speech Language Pathology Treatment: Dysphagia  Patient Details Name: [name] MRN: [id number] DOB: 1941-06-13 Today's Date: 12/26/2014 Time: 1829-9371 SLP Time Calculation (min) (ACUTE ONLY): 15 min  Assessment / Plan / Recommendation Clinical Impression  Pt was seen at bedside with thin water and a Coke via cup sips. Swallow continues to be audible (again sounds more like nasopharynx involvement) and breathing sounds congested, however pt denies. Although no overt coughing elicited, I still have concerns about possible aspiration. RN reports that pt coughed when drinking water this morning, but then went on to eat the rest of her breakfast without incident. Will complete MBSS this afternoon to rule out pharyngeal component and identify diet and strategies as appropriate. Above to Dr. Roderic Palau and her nurse, [name].    HPI HPI: [name] is a 74 yo woman who was admitted after possible seizure at home while drinking water. She has acute on chronic kidney disease stage 4. Likely pre renal. Patient was taking ARB/demadex prior to admission. Continue IV fluids. Some improvement in renal function today. Renal ultrasound without obstruction. Appreciate nephrology assistance. Poss Seizure. Patient had a spell after a choking episode at home. She was not post ictal and did not have any bladder/bowel incontinence. Although the patient does have a seizure disorder, it is unlikely that she had a seizure. Will continue to monitor. Continue Keppra. Pt with hypernatremia, DM2, and sliding scale. Daughter reports that pt gets "strangled" on water, but not soda. SLP asked to evaluate swallow function.    Pertinent Vitals Pain Assessment: No/denies pain  SLP Plan  MBS    Recommendations Diet recommendations: Dysphagia 3 (mechanical soft);Thin liquid Liquids provided via: Cup;No straw Medication Administration: Crushed with puree Supervision: Patient able to self feed;Intermittent supervision to cue for  compensatory strategies Compensations: Slow rate;Small sips/bites Postural Changes and/or Swallow Maneuvers: Seated upright 90 degrees;Upright 30-60 min after meal              Oral Care Recommendations: Oral care BID Plan: MBS    Thank you,  [name], [place]      [place] 12/26/2014, 11:53 AM

## 2014-12-26 NOTE — Progress Notes (Signed)
[facility] PROGRESS NOTE  [name] VHQ:[id number] DOB: October 23, 1941 DOA: 12/22/2014 PCP: Kimberly Font, MD  Summary:  This patient was admitted to the hospital after suffering a choking episode and suspected seizure. On arrival to the emergency room, she was noted to be in acute on chronic kidney disease, dehydration and hypernatremic. She was admitted to the hospital started on intravenous fluids. Nephrology has been assisting in her care regarding her renal failure. Her creatinine has not yet returned to baseline. She's not had any further seizure episodes. She's been seen by speech therapy for dysphagia. Plan will likely be to discharge home once her renal issues are stable.  Assessment/Plan: 1. Acute on chronic kidney disease stage 4. Likely pre renal. Patient was taking ARB/demadex prior to admission. She has been on IV fluids as well as intravenous Lasix. Creatinine is higher today than yesterday. Urine output has not been accurately recorded. Will continue current treatments. Renal ultrasound without obstruction. Appreciate nephrology assistance. 2. Poss Seizure. Patient had a spell after a choking episode at home. She was not post ictal and did not have any bladder/bowel incontinence. Although the patient does have a seizure disorder, it is unlikely that she had a seizure. Will continue to monitor. Continue Keppra. EEG did not show any epileptiform discharges 3. Hypernatremia. Improving with D5 infusion. Continue to monitor. 4. DM2. Continue sliding scale insulin 5. HTN. Stable 6. Dysphagia. Seen by speech therapy and underwent modified barium swallow. Recommendations were mechanical soft diet with liquids..  Code Status: full code Family Communication: discussed with daughter at the bedside Disposition Plan: discharge home   Consultants:  Nephrology  Speech therapy  Procedures:    Antibiotics:    HPI/Subjective: Denies shortness of breath,  cough  Objective: Filed Vitals:   12/26/14 0416  BP: 125/48  Pulse: 70  Temp: 98.8 F (37.1 C)  Resp: 18    Intake/Output Summary (Last 24 hours) at 12/26/14 1833 Last data filed at 12/26/14 1248  Gross per 24 hour  Intake    120 ml  Output      4 ml  Net    116 ml   Filed Weights   12/24/14 0500 12/25/14 0423 12/26/14 0416  Weight: 71.1 kg (156 lb 12 oz) 71.623 kg (157 lb 14.4 oz) 69.4 kg (153 lb)    Exam:   General:  NAD  Cardiovascular: S1, s2 RRR  Respiratory: cta b  Abdomen: soft, nt, nd, bs+  Musculoskeletal: LE edema 1+  Data Reviewed: Basic Metabolic Panel:  Recent Labs Lab 12/22/14 2340 12/23/14 0639 12/24/14 0633 12/25/14 0712 12/26/14 0652  NA 155* 160* 159* 153* 148*  K 3.9 4.0 3.5 3.8 3.5  CL 118* 118* 120* 119* 110  CO2 27 28 31 26 30   GLUCOSE 191* 146* 178* 163* 168*  BUN 85* 80* 73* 62* 62*  CREATININE 4.29* 4.15* 3.74* 3.64* 4.10*  CALCIUM 7.8* 8.4 8.2* 8.0* 7.8*  PHOS  --   --   --  4.3  --    Liver Function Tests:  Recent Labs Lab 12/23/14 0639  AST 35  ALT 19  ALKPHOS 119*  BILITOT 1.1  PROT 8.1  ALBUMIN 2.7*   No results for input(s): LIPASE, AMYLASE in the last 168 hours. No results for input(s): AMMONIA in the last 168 hours. CBC:  Recent Labs Lab 12/22/14 2340 12/23/14 0639 12/24/14 0633 12/25/14 0712  WBC 4.6 5.7 4.2 4.4  NEUTROABS  --  4.1  --   --  HGB 10.3* 10.5* 10.6* 10.8*  HCT 36.4 38.1 37.6 38.0  MCV 85.2 86.2 85.3 85.4  PLT 153 161 156 114*   Cardiac Enzymes: No results for input(s): CKTOTAL, CKMB, CKMBINDEX, TROPONINI in the last 168 hours. BNP (last 3 results)  Recent Labs  03/15/14 1645 04/27/14 1536 06/09/14 1021  PROBNP 37134.0* 51631.0* 30358.0*   CBG:  Recent Labs Lab 12/25/14 2347 12/26/14 0414 12/26/14 0737 12/26/14 1126 12/26/14 1708  GLUCAP 153* 165* 150* 222* 203*    No results found for this or any previous visit (from the past 240 hour(s)).   Studies: No  results found.  Scheduled Meds: . calcitRIOL  0.25 mcg Oral Daily  . donepezil  10 mg Oral QHS  . enoxaparin (LOVENOX) injection  30 mg Subcutaneous Q24H  . folic acid  1 mg Oral q morning - 10a  . furosemide  40 mg Intravenous BID  . hydrALAZINE  75 mg Oral 3 times per day  . insulin aspart  0-9 Units Subcutaneous TID WC  . isosorbide mononitrate  15 mg Oral q morning - 10a  . levETIRAcetam  500 mg Oral BID  . linagliptin  5 mg Oral q morning - 10a  . metoprolol succinate  50 mg Oral q morning - 10a  . pantoprazole  40 mg Oral q morning - 10a  . pravastatin  20 mg Oral QHS  . sodium bicarbonate  650 mg Oral TID  . sodium chloride  3 mL Intravenous Q12H   Continuous Infusions: . dextrose 125 mL/hr at 12/26/14 8264    Active Problems:   Hypertension   Diabetes mellitus   Dementia   Chronic kidney disease (CKD), stage IV (severe)   Seizure   Acute renal failure   Anemia   Hypernatremia   Dysphagia    Time spent: 66mins    [name],[name]  [facility] Pager [phone]. If 7PM-7AM, please contact night-coverage at [email or web address], password [facility] 12/26/2014, 6:33 PM  LOS: 4 days

## 2014-12-26 NOTE — Progress Notes (Signed)
Telemetry called stating pt had a 6 beat run of vtack. Pt was being repositioned. Will continue to monitor.

## 2014-12-26 NOTE — Procedures (Signed)
  [place] A. Merlene Laughter, MD     [email or web address]           HISTORY: The patient is 74 year old who presents with seizures.  MEDICATIONS: Scheduled Meds: Continuous Infusions: PRN Meds:.  Prior to Admission medications   Medication Sig Start Date End Date Taking? Authorizing Provider  calcitRIOL (ROCALTROL) 0.25 MCG capsule Take 1 capsule (0.25 mcg total) by mouth daily. 05/02/14   Eugenie Filler, MD  donepezil (ARICEPT) 10 MG tablet Take 10 mg by mouth at bedtime.  01/30/14   Historical Provider, MD  folic acid (FOLVITE) 1 MG tablet Take 1 mg by mouth every morning.     Historical Provider, MD  furosemide (LASIX) 40 MG tablet Take 40 mg by mouth daily.    Historical Provider, MD  hydrALAZINE (APRESOLINE) 25 MG tablet Take 75 mg by mouth every 8 (eight) hours.    Historical Provider, MD  hydrocortisone (ANUSOL-HC) 25 MG suppository Place 1 suppository (25 mg total) rectally 2 (two) times daily. 10/25/14   Kathie Dike, MD  insulin detemir (LEVEMIR) 100 UNIT/ML injection Inject 0.08 mLs (8 Units total) into the skin at bedtime. 05/02/14   Eugenie Filler, MD  isosorbide mononitrate (IMDUR) 30 MG 24 hr tablet Take 15 mg by mouth every morning.     Historical Provider, MD  levETIRAcetam (KEPPRA) 100 MG/ML solution Take 5 mLs by mouth 2 (two) times daily. 01/13/14   Historical Provider, MD  linagliptin (TRADJENTA) 5 MG TABS tablet Take 5 mg by mouth every morning.     Historical Provider, MD  metoprolol succinate (TOPROL-XL) 100 MG 24 hr tablet Take 50 mg by mouth every morning. Take with or immediately following a meal.    Historical Provider, MD  mupirocin ointment (BACTROBAN) 2 % Place 1 application into the nose 2 (two) times daily.    Historical Provider, MD  pantoprazole (PROTONIX) 40 MG tablet Take 40 mg by mouth every morning.    Historical Provider, MD  potassium chloride (K-DUR) 10 MEQ tablet Take 20 mEq by mouth daily. Takes with torsemide 09/21/14    Historical Provider, MD  pravastatin (PRAVACHOL) 20 MG tablet Take 20 mg by mouth at bedtime.     Historical Provider, MD  sodium bicarbonate 650 MG tablet Take 1,300 mg by mouth 3 (three) times daily.     Historical Provider, MD  torsemide (DEMADEX) 100 MG tablet Take 100 mg by mouth daily.  09/21/14   Historical Provider, MD      ANALYSIS: A 16 channel recording using standard 10 20 measurements is conducted for 21 minutes. There is a posterior dominant rhythm neck is as high as 7 Hz. There is beta activity observed in the frontal areas. Awake and drowsy activities are observed. Photic stimulation and hyperventilation were not carried out. There is no focal or lateral slowing. There is no epileptiform activity observed.   IMPRESSION: The recording shows mild generalized slowing indicating a mild generalized encephalopathy. However, there are no epileptiform activity observed.      [place] A. Merlene Laughter, M.D.  Diplomate, Tax adviser of Psychiatry and Neurology ( Neurology).

## 2014-12-26 NOTE — Progress Notes (Signed)
SPEECH PATHOLOGY  Interim note- MBSS completed this date, full results to follow. Continue D3/mech soft and thin liquids with the following strategies: take small sips of liquids, swallow 2x for each sip taken. Pt presents with a delay in swallow initiation and pooling of residuals after the swallow which pose risk for aspiration. Pt does not appear sensate to residuals, however she does sound congested, a cued repeat swallow clears residuals. Although pt did not aspirate during the study, she is at mild/moderate risk for aspiration due to delay and decreased sensation/awareness, risks are minimized with dry/repeat swallow.   Thank you,  Genene Churn, CCC-SLP [phone]

## 2014-12-26 NOTE — Progress Notes (Signed)
Subjective: Interval History: Patient presently offers no complaints. She doesn't have any nausea or vomiting. She denies also any difficulty in breathing.  Objective: Vital signs in last 24 hours: Temp:  [98.5 F (36.9 C)-98.8 F (37.1 C)] 98.8 F (37.1 C) (02/02 0416) Pulse Rate:  [68-72] 70 (02/02 0416) Resp:  [16-18] 18 (02/02 0416) BP: (125-128)/(45-48) 125/48 mmHg (02/02 0416) SpO2:  [94 %-100 %] 94 % (02/02 0416) Weight:  [69.4 kg (153 lb)] 69.4 kg (153 lb) (02/02 0416) Weight change: -2.223 kg (-4 lb 14.4 oz)  Intake/Output from previous day: 02/01 0701 - 02/02 0700 In: 230 [P.O.:230] Out: 300 [Urine:300] Intake/Output this shift:    General appearance: alert, cooperative and no distress Resp: wheezes bilaterally Cardio: regular rate and rhythm, S1, S2 normal, no murmur, click, rub or gallop GI: soft, non-tender; bowel sounds normal; no masses,  no organomegaly Extremities: extremities normal, atraumatic, no cyanosis or edema  Lab Results:  Recent Labs  12/24/14 0633 12/25/14 0712  WBC 4.2 4.4  HGB 10.6* 10.8*  HCT 37.6 38.0  PLT 156 114*   BMET:   Recent Labs  12/25/14 0712 12/26/14 0652  NA 153* 148*  K 3.8 3.5  CL 119* 110  CO2 26 30  GLUCOSE 163* 168*  BUN 62* 62*  CREATININE 3.64* 4.10*  CALCIUM 8.0* 7.8*   No results for input(s): PTH in the last 72 hours. Iron Studies: No results for input(s): IRON, TIBC, TRANSFERRIN, FERRITIN in the last 72 hours.  Studies/Results: No results found.  I have reviewed the patient's current medications.  Assessment/Plan: Problem #1 acute kidney injury possibly secondary to prerenal syndrome. Her renal function which was improving show sign of worsening today. Her urine output also has declined. Problem #2 hypernatremia: Because of lack of free water. Her sodium is 148  also improving. Patient presently seems to be more alert. Problem #3 hypertension: Her blood pressure is reasonably controlled Problem #4  chronic renal failure stage IV. Presently patient denies any nausea or vomiting. Problem #5 diabetes. Problem 6 history of seizure disorder Problem #7 metabolic bone disease: Her calcium and her phosphorus is  in  Range Problem #8 anemia: Her hemoglobin and hematocrit is within our target goal. He is stable. Plan: Continue with increase her IV fluid to 1 25 mL per hour Continue his Lasix. We'll check her basic metabolic panel in the morning.    LOS: 4 days   Analiya Porco S 12/26/2014,7:56 AM

## 2014-12-27 LAB — GLUCOSE, CAPILLARY
GLUCOSE-CAPILLARY: 157 mg/dL — AB (ref 70–99)
Glucose-Capillary: 141 mg/dL — ABNORMAL HIGH (ref 70–99)
Glucose-Capillary: 178 mg/dL — ABNORMAL HIGH (ref 70–99)
Glucose-Capillary: 214 mg/dL — ABNORMAL HIGH (ref 70–99)
Glucose-Capillary: 146 mg/dL — ABNORMAL HIGH (ref 70–99)
Glucose-Capillary: 205 mg/dL — ABNORMAL HIGH (ref 70–99)

## 2014-12-27 LAB — CBC
HCT: 34.4 % — ABNORMAL LOW (ref 36.0–46.0)
Hemoglobin: 9.9 g/dL — ABNORMAL LOW (ref 12.0–15.0)
MCH: 23.7 pg — ABNORMAL LOW (ref 26.0–34.0)
MCHC: 28.8 g/dL — AB (ref 30.0–36.0)
MCV: 82.3 fL (ref 78.0–100.0)
Platelets: 150 10*3/uL (ref 150–400)
RBC: 4.18 MIL/uL (ref 3.87–5.11)
RDW: 27.4 % — ABNORMAL HIGH (ref 11.5–15.5)
WBC: 4.5 10*3/uL (ref 4.0–10.5)

## 2014-12-27 LAB — BASIC METABOLIC PANEL
ANION GAP: 9 (ref 5–15)
BUN: 59 mg/dL — ABNORMAL HIGH (ref 6–23)
CHLORIDE: 103 mmol/L (ref 96–112)
CO2: 28 mmol/L (ref 19–32)
Calcium: 7.7 mg/dL — ABNORMAL LOW (ref 8.4–10.5)
Creatinine, Ser: 3.95 mg/dL — ABNORMAL HIGH (ref 0.50–1.10)
GFR calc Af Amer: 12 mL/min — ABNORMAL LOW (ref >90)
GFR calc non Af Amer: 10 mL/min — ABNORMAL LOW (ref >90)
Glucose, Bld: 168 mg/dL — ABNORMAL HIGH (ref 70–99)
Potassium: 3.3 mmol/L — ABNORMAL LOW (ref 3.5–5.1)
Sodium: 140 mmol/L (ref 135–145)

## 2014-12-27 MED ORDER — SODIUM CHLORIDE 0.9 % IV SOLN
250.0000 mg | Freq: Once | INTRAVENOUS | Status: AC
  Administered 2014-12-27: 250 mg via INTRAVENOUS
  Filled 2014-12-27: qty 20

## 2014-12-27 NOTE — Progress Notes (Signed)
[name]  MRN: [id number]  DOB/AGE: Mar 26, 1941 74 y.o.  Primary Care Physician:KimberlyGERALD K, MD  Admit date: 12/22/2014  Chief Complaint:  Chief Complaint  Patient presents with  . Seizures    S-Pt presented on  12/22/2014 with  Chief Complaint  Patient presents with  . Seizures  .    Pt today feels better  Meds . calcitRIOL  0.25 mcg Oral Daily  . donepezil  10 mg Oral QHS  . enoxaparin (LOVENOX) injection  30 mg Subcutaneous Q24H  . folic acid  1 mg Oral q morning - 10a  . furosemide  40 mg Intravenous BID  . hydrALAZINE  75 mg Oral 3 times per day  . insulin aspart  0-9 Units Subcutaneous TID WC  . isosorbide mononitrate  15 mg Oral q morning - 10a  . levETIRAcetam  500 mg Oral BID  . metoprolol succinate  50 mg Oral q morning - 10a  . pantoprazole  40 mg Oral q morning - 10a  . pravastatin  20 mg Oral QHS  . sodium bicarbonate  650 mg Oral TID  . sodium chloride  3 mL Intravenous Q12H      Physical Exam: Vital signs in last 24 hours: Temp:  [98.1 F (36.7 C)-98.7 F (37.1 C)] 98.4 F (36.9 C) (02/03 1411) Pulse Rate:  [67-75] 75 (02/03 1411) Resp:  [15-17] 17 (02/03 1411) BP: (117-151)/(63-84) 117/84 mmHg (02/03 1411) SpO2:  [97 %-100 %] 100 % (02/03 1411) Weight:  [165 lb 12.8 oz (75.206 kg)] 165 lb 12.8 oz (75.206 kg) (02/03 0507) Weight change: 12 lb 12.8 oz (5.806 kg) Last BM Date: 12/27/14  Intake/Output from previous day: 02/02 0701 - 02/03 0700 In: 9371.3 [P.O.:240; I.V.:9131.3] Out: 1102 [Urine:1100; Stool:2] Total I/O In: 1535.9 [P.O.:360; I.V.:1175.9] Out: -    Physical Exam: General- pt is awake,lying comfortably in bed Resp- No acute REsp distress, CTA B/L NO Rhonchi CVS- S1S2 regular in rate and rhythm GIT- BS+, soft, NT, ND EXT- NO LE Edema, No Cyanosis   Lab Results: CBC  Recent Labs  12/25/14 0712 12/27/14 0628  WBC 4.4 4.5  HGB 10.8* 9.9*  HCT 38.0 34.4*  PLT 114* 150    BMET  Recent Labs  12/26/14 0652  12/27/14 0628  NA 148* 140  [name] 3.5 3.3*  CL 110 103  CO2 30 28  GLUCOSE 168* 168*  BUN 62* 59*  CREATININE 4.10* 3.95*  CALCIUM 7.8* 7.7*    Trend Creat 2016 4.29=>3.64=>4.10=>3.95 2015  2.0--3.75( Multiple admission) 2014  1.4--2.5 2013   1.5--1.7 2012    1.26  Trend Sodium 2016 160=>148  MICRO No results found for this or any previous visit (from the past 240 hour(s)).    Lab Results  Component Value Date   PTH 268.4* 04/28/2014   CALCIUM 7.7* 12/27/2014   PHOS 4.3 12/25/2014         Impression: 1)Renal  AKI secondary to Prerenal/ATN               AKI on CKD               AKI now minimally better               CKD stage 4 .               CKD since 2013               CKD secondary to DM/HTN/Age associated decline  Progression of CKD marked with multiple  AKI in 2015 and now in 2016                Proteinuria Present in U/a  2)HTN  Medication- On Diuretics. On Beta blockers On Vasodilators  3)Anemia HGb at goal (9--11)   4)CKD Mineral-Bone Disorder PTH elevated. Secondary Hyperparathyroidism present. Phosphorus at goal.   5)CNS-admitted with Seizure activity Primary MD following  6)Electrolytes Normokalemic- now hypokalemic  Hypernatremic   Now better  7)Acid base Co2 at goal     Plan:  Will reduce IVf rate Will add kcl to IVF Will follow Bmet.    Reeds S 12/27/2014, 6:40 PM

## 2014-12-27 NOTE — Progress Notes (Signed)
PROGRESS NOTE  [name] HYW:[id number] DOB: 1941/08/25 DOA: 12/22/2014 PCP: [name], MD  Summary: 74 year old woman with history of seizure disorder who presented to the emergency department after choking on some water and then having a seizure. In the emergency department she was found to have acute renal failure. Diuretic and ACE inhibitor were held and she was admitted for further evaluation. She had no recurrent seizures. Keppra was continued. She was seen by nephrology with some improvement in renal function but not yet back to baseline.  Assessment/Plan: 1. Acute kidney injury superimposed on on chronic kidney disease stage IV. Prerenal ideology suspected. ACE inhibitor and diuretics stopped. Renal ultrasound was unremarkable. Modest improvement since admission. 2. Hypernatremia. Resolved with fluids. 3. Possible seizure prior to admission. History of seizure disorder maintained on Keppra. EEG was unremarkable. Seizure somewhat doubted by previous physician. 4. Possible aspiration, dysphagia. No clinical evidence of significant aspiration. Diet per speech therapy. 5. Anemia of chronic kidney disease. Stable. 6. Diabetes mellitus type 2, remains stable.   Modest renal improvement, fair urine output  hypernatremia resolved  Management of renal failure and fluids per nephrology  Code Status: full code DVT prophylaxis: Lovenox Family Communication: daughter at bedside Disposition Plan: return home with daughter  [name], MD  [facility]  Pager [phone] If 7PM-7AM, please contact night-coverage at [email or web address], password [facility] 12/27/2014, 4:23 PM  LOS: 5 days   Consultants:  Nephrology  Speech therapy: Dysphagia 3 diet, thin liquids  Procedures:  EEG IMPRESSION: The recording shows mild generalized slowing indicating a mild generalized encephalopathy. However, there are no epileptiform activity  observed.  Antibiotics:    HPI/Subjective: Feeling ok, no complaints, eating well, no pain.  Objective: Filed Vitals:   12/26/14 0416 12/26/14 2152 12/27/14 0507 12/27/14 1411  BP: 125/48 149/64 151/63 117/84  Pulse: 70 69 67 75  Temp: 98.8 F (37.1 C) 98.1 F (36.7 C) 98.7 F (37.1 C) 98.4 F (36.9 C)  TempSrc: Axillary Oral Oral Oral  Resp: 18 16 15 17   Height:      Weight: 69.4 kg (153 lb)  75.206 kg (165 lb 12.8 oz)   SpO2: 94% 100% 97% 100%    Intake/Output Summary (Last 24 hours) at 12/27/14 1623 Last data filed at 12/27/14 1300  Gross per 24 hour  Intake 10787.17 ml  Output   1100 ml  Net 9687.17 ml     Filed Weights   12/25/14 0423 12/26/14 0416 12/27/14 0507  Weight: 71.623 kg (157 lb 14.4 oz) 69.4 kg (153 lb) 75.206 kg (165 lb 12.8 oz)    Exam:     Afebrile, vital signs stable. General:  Appears calm and comfortable, chronically ill Cardiovascular: RRR, no m/r/g. No LE edema. Telemetry: SR, no arrhythmias  Respiratory: CTA bilaterally, no w/r/r. Normal respiratory effort. Abdomen: soft, ntnd Skin: no rash or induration seen Musculoskeletal: grossly normal tone BUE/BLE, appears generally weak Psychiatric: odd affect, speech fluent and appropriate Neurologic: grossly non-focal.  Data Reviewed:  +8.5 L since admission.  Blood sugars stable  Potassium 3.3, BUN and creatinine slightly improved today.  Hemoglobin stable  Platelet count 153 >> >> 114 >> 150  Pertinent data:  Labs  Sodium 155 >> >> 140  Creatinine 4.29 >> >> 3.95 Imaging   Renal ultrasound unremarkable  CT head no acute intracranial process.  Chest x-ray no acute abnormalities Other    Pending data:    Scheduled Meds: . calcitRIOL  0.25 mcg Oral Daily  . donepezil  10  mg Oral QHS  . enoxaparin (LOVENOX) injection  30 mg Subcutaneous Q24H  . folic acid  1 mg Oral q morning - 10a  . furosemide  40 mg Intravenous BID  . hydrALAZINE  75 mg Oral 3 times per  day  . insulin aspart  0-9 Units Subcutaneous TID WC  . isosorbide mononitrate  15 mg Oral q morning - 10a  . levETIRAcetam  500 mg Oral BID  . linagliptin  5 mg Oral q morning - 10a  . metoprolol succinate  50 mg Oral q morning - 10a  . pantoprazole  40 mg Oral q morning - 10a  . pravastatin  20 mg Oral QHS  . sodium bicarbonate  650 mg Oral TID  . sodium chloride  3 mL Intravenous Q12H   Continuous Infusions: . dextrose 125 mL/hr at 12/27/14 7414    Active Problems:   Hypertension   Diabetes mellitus   Dementia   Chronic kidney disease (CKD), stage IV (severe)   Seizure   Acute renal failure   Anemia   Hypernatremia   Dysphagia   Time spent 20 minutes

## 2014-12-28 ENCOUNTER — Inpatient Hospital Stay (HOSPITAL_COMMUNITY): Payer: Medicare Other

## 2014-12-28 LAB — BASIC METABOLIC PANEL
ANION GAP: 9 (ref 5–15)
BUN: 60 mg/dL — ABNORMAL HIGH (ref 6–23)
CO2: 26 mmol/L (ref 19–32)
Calcium: 7.5 mg/dL — ABNORMAL LOW (ref 8.4–10.5)
Chloride: 101 mmol/L (ref 96–112)
Creatinine, Ser: 3.68 mg/dL — ABNORMAL HIGH (ref 0.50–1.10)
GFR calc non Af Amer: 11 mL/min — ABNORMAL LOW (ref >90)
GFR, EST AFRICAN AMERICAN: 13 mL/min — AB (ref >90)
GLUCOSE: 200 mg/dL — AB (ref 70–99)
Potassium: 3.3 mmol/L — ABNORMAL LOW (ref 3.5–5.1)
Sodium: 136 mmol/L (ref 135–145)

## 2014-12-28 LAB — GLUCOSE, CAPILLARY
GLUCOSE-CAPILLARY: 210 mg/dL — AB (ref 70–99)
GLUCOSE-CAPILLARY: 254 mg/dL — AB (ref 70–99)
GLUCOSE-CAPILLARY: 268 mg/dL — AB (ref 70–99)
Glucose-Capillary: 180 mg/dL — ABNORMAL HIGH (ref 70–99)
Glucose-Capillary: 200 mg/dL — ABNORMAL HIGH (ref 70–99)
Glucose-Capillary: 185 mg/dL — ABNORMAL HIGH (ref 70–99)

## 2014-12-28 MED ORDER — INSULIN DETEMIR 100 UNIT/ML ~~LOC~~ SOLN
5.0000 [IU] | Freq: Every day | SUBCUTANEOUS | Status: DC
  Administered 2014-12-28 – 2014-12-30 (×3): 5 [IU] via SUBCUTANEOUS
  Filled 2014-12-28 (×4): qty 0.05

## 2014-12-28 MED ORDER — POTASSIUM CHLORIDE CRYS ER 20 MEQ PO TBCR
40.0000 meq | EXTENDED_RELEASE_TABLET | Freq: Once | ORAL | Status: AC
  Administered 2014-12-28: 40 meq via ORAL
  Filled 2014-12-28: qty 2

## 2014-12-28 MED ORDER — INSULIN ASPART 100 UNIT/ML ~~LOC~~ SOLN
0.0000 [IU] | Freq: Every day | SUBCUTANEOUS | Status: DC
  Administered 2014-12-28: 3 [IU] via SUBCUTANEOUS
  Administered 2014-12-29 – 2014-12-30 (×2): 2 [IU] via SUBCUTANEOUS

## 2014-12-28 MED ORDER — FUROSEMIDE 40 MG PO TABS
40.0000 mg | ORAL_TABLET | Freq: Every day | ORAL | Status: DC
  Administered 2014-12-28 – 2014-12-31 (×4): 40 mg via ORAL
  Filled 2014-12-28 (×5): qty 1

## 2014-12-28 NOTE — Progress Notes (Signed)
Speech therapy in to see pt today and to help determine safety of pt while eating. Pt will be put on thickened liquids to decrease risk for aspiration. Pt encouraged and educated at meal times to take small bites and to chew them thoroughly. Also, pt encouraged to drink small sips of fluid and not to gulp down any beverage. Pt seems to comprehend this only momentarily and then reverts back to her previous actions. Needs reinforcement with teaching

## 2014-12-28 NOTE — Progress Notes (Signed)
Subjective: Interval History: Patient denies any dificulty in breathing. Feels ok and no complaint  Objective: Vital signs in last 24 hours: Temp:  [97.7 F (36.5 C)-98.6 F (37 C)] 98.6 F (37 C) (02/04 1416) Pulse Rate:  [70-76] 76 (02/04 1416) Resp:  [16-18] 18 (02/04 1416) BP: (129-149)/(54-64) 144/54 mmHg (02/04 1416) SpO2:  [94 %-100 %] 98 % (02/04 1416) Weight:  [79 kg (174 lb 2.6 oz)] 79 kg (174 lb 2.6 oz) (02/04 0445) Weight change: 3.793 kg (8 lb 5.8 oz)  Intake/Output from previous day: 02/03 0701 - 02/04 0700 In: 1655.9 [P.O.:480; I.V.:1175.9] Out: 951 [Urine:950; Stool:1] Intake/Output this shift: Total I/O In: 3 [I.V.:3] Out: -   Patient is alert Chest: Decreased breath sound no wheezing X  her heart exam regular rate and rhythm Extremities no edema  Lab Results:  Recent Labs  12/27/14 0628  WBC 4.5  HGB 9.9*  HCT 34.4*  PLT 150   BMET:   Recent Labs  12/27/14 0628 12/28/14 0636  NA 140 136  K 3.3* 3.3*  CL 103 101  CO2 28 26  GLUCOSE 168* 200*  BUN 59* 60*  CREATININE 3.95* 3.68*  CALCIUM 7.7* 7.5*   No results for input(s): PTH in the last 72 hours. Iron Studies: No results for input(s): IRON, TIBC, TRANSFERRIN, FERRITIN in the last 72 hours.  Studies/Results: No results found.  I have reviewed the patient's current medications.  Assessment/Plan: Problem #1 acute kidney injury possibly secondary to prerenal syndrome. Her renal function is progressively improving. Still pending creatinine is above her baseline. Problem #2 hypernatremia: Because of lack of free water. Her sodium has corrected. Problem #3 hypertension: Her blood pressure is reasonably controlled Problem #4 chronic renal failure stage IV. Presently patient denies any nausea or vomiting. Problem #5 diabetes. Problem 6 history of seizure disorder Problem #7 metabolic bone disease: Her calcium and her phosphorus is  in  Range Problem #8 anemia: Her hemoglobin and  hematocrit is within our target goal. He is stable. Problem #9 hypokalemia most likely from diuretics. Plan: Change Lasix to 40 mg once a day Decrease IV fluids to 50 mL per hour We'll give her KCl 40 mEq by mouth 1 dose We'll check her basic metabolic panel in the morning    LOS: 6 days   Kalilah Barua S 12/28/2014,2:21 PM

## 2014-12-28 NOTE — Progress Notes (Signed)
Inpatient Diabetes Program Recommendations  AACE/ADA: New Consensus Statement on Inpatient Glycemic Control (2013)  Target Ranges:  Prepandial:   less than 140 mg/dL      Peak postprandial:   less than 180 mg/dL (1-2 hours)      Critically ill patients:  140 - 180 mg/dL   Results for [name], [name] (MRN [id number]) as of 12/28/2014 08:44  Ref. Range 12/27/2014 07:15 12/27/2014 11:21 12/27/2014 16:22 12/27/2014 20:15 12/28/2014 00:28 12/28/2014 05:12 12/28/2014 07:15  Glucose-Capillary Latest Range: 70-99 mg/dL 178 (H) 214 (H) 146 (H) 205 (H) 210 (H) 180 (H) 200 (H)   Diabetes history: DM2 Outpatient Diabetes medications: Levemir 8 units QHS, Tradjenat 5 mg QAM Current orders for Inpatient glycemic control: Novolog 0-9 units TID with meals  Inpatient Diabetes Program Recommendations Insulin - Basal: Please consider ordering Levemir 5 units daily (to start now). Correction (SSI): Bedtime glucose 205 mg/dl but no insulin correction ordered. Please consider ordering Novolog bedtime correction scale.  Thanks, Barnie Alderman, RN, MSN, CCRN, CDE Diabetes Coordinator Inpatient Diabetes Program [phone] (Team Pager) [phone] ([place] office) [phone] [facility] office)

## 2014-12-28 NOTE — Progress Notes (Signed)
Speech Language Pathology Treatment: Dysphagia  Patient Details Name: [name] MRN: [id number] DOB: 02/06/1941 Today's Date: 12/28/2014 Time: 4401-0272 SLP Time Calculation (min) (ACUTE ONLY): 23 min  Assessment / Plan / Recommendation Clinical Impression  [name] was seen at bedside for diet tolerance following her MBSS on Tuesday. Study was again reviewed with Kimberly and strategies reinforced (small sips, swallow 2x/each sip). Kimberly verbally denies any difficulties swallowing. Kimberly requested a Coke with no ice and self presented with hand guidance from clinician. Kimberly with poor labial closure (seems to swallow with mouth open) and strong, immediate coughing elicited. Kimberly gagging, coughing, appeared to vomit small amount, with expectoration from mouth and running out of her nose. Kimberly with copious secretions from mouth and nose, watery eyes, and short of breath. I cleaned Kimberly with wash cloths and Kimberly encouraged to blow her nose. RN alerted (she came in room shortly after because her leads were off). Kimberly sounds congested/wet. She was encouraged to cough, expectorate, and swallow.   I suspect this is similar to the episode she had at home with her daughter, [name]. Laryngeal and pharyngeal function (strength) were WNL during MBSS, however Kimberly with delay in swallow initiation and oral weakness and decreased awareness despite cues. Given witnessed suspected aspiration event with soda today and family report of similar occurrences at home with water, will downgrade liquids to NECTAR-thick. Kimberly will need 100% supervision during meals with verbal reminders to "close lips" and to swallow 2x with each sip. Recommend F/U with [facility] SLP for diet tolerance and advancement if indicated. Family is currently not present. SLP will call [name] today. Swallow precautions place at [facility].   HPI HPI: [name] is a 74 yo woman who was admitted after possible seizure at home while drinking water. She has acute on chronic kidney  disease stage 4. Likely pre renal. Patient was taking ARB/demadex prior to admission. Continue IV fluids. Some improvement in renal function today. Renal ultrasound without obstruction. Appreciate nephrology assistance. Poss Seizure. Patient had a spell after a choking episode at home. She was not post ictal and did not have any bladder/bowel incontinence. Although the patient does have a seizure disorder, it is unlikely that she had a seizure. Will continue to monitor. Continue Keppra. Kimberly with hypernatremia, DM2, and sliding scale. Daughter reports that Kimberly gets "strangled" on water, but not soda. SLP asked to evaluate swallow function.    Pertinent Vitals Pain Assessment: No/denies pain  SLP Plan  Continue with current plan of care    Recommendations Diet recommendations: Dysphagia 3 (mechanical soft);Nectar-thick liquid Liquids provided via: Cup;No straw Medication Administration: Crushed with puree Supervision: Patient able to self feed;Intermittent supervision to cue for compensatory strategies Compensations: Slow rate;Small sips/bites;Multiple dry swallows after each bite/sip Postural Changes and/or Swallow Maneuvers: Seated upright 90 degrees;Upright 30-60 min after meal              Oral Care Recommendations: Oral care BID Follow up Recommendations: Home health SLP Plan: Continue with current plan of care    Thank you,  [name], [name]      [place] 12/28/2014, 12:12 PM

## 2014-12-28 NOTE — Progress Notes (Signed)
PROGRESS NOTE  [name]:[id number] DOB: April 26, 1941 DOA: 12/22/2014 PCP: [name], MD  Summary: 74 year old woman with history of seizure disorder who presented to the emergency department after choking on some water and then having a seizure. In the emergency department she was found to have acute renal failure. Diuretic and ACE inhibitor were held and she was admitted for further evaluation. She had no recurrent seizures. Keppra was continued. She was seen by nephrology with some improvement in renal function but not yet back to baseline.  Assessment/Plan: 1. Acute kidney injury superimposed on on chronic kidney disease stage IV. Prerenal etiology suspected. ACE inhibitor and diuretics stopped. Renal ultrasound was unremarkable. Small improvement daily. 2. Hypernatremia. Resolved with fluids. 3. Possible seizure prior to admission. Very much doubted--likely aspiration event. History of seizure disorder maintained on Keppra. EEG was unremarkable.  4. Suspected aspiration prior to admission, dysphagia. Diet per speech therapy. Got choked on ST testing and had some generalized tremors at that time per speech therapy. 5. Anemia of chronic kidney disease. Stable. 6. Diabetes mellitus type 2, stable.   Management of AKi per nephrology, positive fluid balance noted but asymptomatic, mild LE edema, no evidence of pulmonary compromise.  Check chest x-ray given aspiration earlier today. Respiratory status appears stable.  Restart Levemir 5 units daily; start nighttime SSI   Overall stable but concern for long-term risk of aspiration.   Code Status: full code DVT prophylaxis: Lovenox Family Communication: daughter at bedside Disposition Plan: return home with daughter  [name], MD  [facility]  Pager [phone] If 7PM-7AM, please contact night-coverage at [email or web address], password [facility] 12/28/2014, 11:56 AM  LOS: 6 days   Consultants:  Nephrology  Speech therapy:  Dysphagia 3 diet, thin liquids  Procedures:  EEG IMPRESSION: The recording shows mild generalized slowing indicating a mild generalized encephalopathy. However, there are no epileptiform activity observed.  Antibiotics:    HPI/Subjective: Feeling well, no complaints. No nausea or vomiting, no SOB, no pain.   Aspirated during ST trial earlier today.  Objective: Filed Vitals:   12/27/14 0507 12/27/14 1411 12/27/14 2016 12/28/14 0445  BP: 151/63 117/84 129/57 149/64  Pulse: 67 75 70 73  Temp: 98.7 F (37.1 C) 98.4 F (36.9 C) 98.3 F (36.8 C) 97.7 F (36.5 C)  TempSrc: Oral Oral Oral Oral  Resp: 15 17 16 18   Height:      Weight: 75.206 kg (165 lb 12.8 oz)   79 kg (174 lb 2.6 oz)  SpO2: 97% 100% 100% 94%    Intake/Output Summary (Last 24 hours) at 12/28/14 1156 Last data filed at 12/28/14 0818  Gross per 24 hour  Intake    243 ml  Output    951 ml  Net   -708 ml     Filed Weights   12/26/14 0416 12/27/14 0507 12/28/14 0445  Weight: 69.4 kg (153 lb) 75.206 kg (165 lb 12.8 oz) 79 kg (174 lb 2.6 oz)    Exam:     Afebrile, vital signs stable. General:  Appears comfortable, calm. Cardiovascular: Regular rate and rhythm, no murmur, rub or gallop. 1+ bilateral lower extremity edema. Respiratory: Clear to auscultation bilaterally, no wheezes, rales or rhonchi. Normal respiratory effort. Prominent upper airway noise, secretinos. Psychiatric: grossly normal mood and affect, speech fluent and appropriate  Data Reviewed:  +9 L since admission  Blood sugars stable 180-200s  Potassium 3.3, creatinine somewhat improved. BUN without significant change.  Hemoglobin stable  Platelet count 153 >> >> 114 >>  150  Pertinent data:  Labs  Sodium 155 >> >> 136  Creatinine 4.29 >> >> 3.95 Imaging   Renal ultrasound unremarkable  CT head no acute intracranial process.  Chest x-ray no acute abnormalities Other    Pending data:    Scheduled Meds: .  calcitRIOL  0.25 mcg Oral Daily  . donepezil  10 mg Oral QHS  . enoxaparin (LOVENOX) injection  30 mg Subcutaneous Q24H  . folic acid  1 mg Oral q morning - 10a  . furosemide  40 mg Intravenous BID  . hydrALAZINE  75 mg Oral 3 times per day  . insulin aspart  0-9 Units Subcutaneous TID WC  . isosorbide mononitrate  15 mg Oral q morning - 10a  . levETIRAcetam  500 mg Oral BID  . metoprolol succinate  50 mg Oral q morning - 10a  . pantoprazole  40 mg Oral q morning - 10a  . pravastatin  20 mg Oral QHS  . sodium bicarbonate  650 mg Oral TID  . sodium chloride  3 mL Intravenous Q12H   Continuous Infusions: . dextrose 1 mL (12/27/14 2113)    Active Problems:   Hypertension   Diabetes mellitus   Dementia   Chronic kidney disease (CKD), stage IV (severe)   Seizure   Acute renal failure   Anemia   Hypernatremia   Dysphagia   Time spent 20 minutes

## 2014-12-28 NOTE — Progress Notes (Signed)
UR chart review completed.  

## 2014-12-28 NOTE — Evaluation (Addendum)
Physical Therapy Evaluation Patient Details Name: [name] MRN: [id number] DOB: [birth year] Today's Date: 12/28/2014   History of Present Illness  HPI: 74 yo dm2, htn, seizure, ckd stage 3 was found to have seizure ?  After having difficulty swallowing water.  Family says that the shaking might have been related to her having choking.  "strangled on water". Shaking lasted about 10-15 minutes.  Pt appears to be at baseline per her family.  Pt was brought to ED and found to have ARF, and hypernatremia. Pt lives with her daughter in an apartment and needs assist with most ADLs.  She ambulates short distances with a walker.  Clinical Impression  Pt was seen for evaluation.  She was lethargic but cooperative and found to be generally weak and deconditioned.  She currently needs assistance with transfers out of bed and was able to ambulate only 20' with a walker,  She has an unstable gait pattern with some ataxia noted.  This is probably a result of prior stroke as some spasticity was noted in the RLE.  As long as family can manage her at home she should be able to return there with HHPT.  Otherwise, She will need SNF.    Follow Up Recommendations Home health PT or SNF    Equipment Recommendations  None recommended by PT    Recommendations for Other Services OT consult     Precautions / Restrictions Precautions Precautions: Fall Restrictions Weight Bearing Restrictions: No      Mobility  Bed Mobility Overal bed mobility: Needs Assistance Bed Mobility: Supine to Sit     Supine to sit: Max assist;HOB elevated        Transfers Overall transfer level: Needs assistance Equipment used: Rolling walker (2 wheeled) Transfers: Sit to/from Stand Sit to Stand: Mod assist            Ambulation/Gait Ambulation/Gait assistance: Mod assist Ambulation Distance (Feet): 20 Feet Assistive device: Rolling walker (2 wheeled) Gait Pattern/deviations: Wide base of  support;Ataxic;Shuffle;Decreased dorsiflexion - right;Decreased dorsiflexion - left   Gait velocity interpretation: Below normal speed for age/gender General Gait Details: leans to the right in sitting and standing, RLE abducted  Stairs            Wheelchair Mobility    Modified Rankin (Stroke Patients Only)       Balance Overall balance assessment: Needs assistance Sitting-balance support: No upper extremity supported;Feet supported Sitting balance-Leahy Scale: Poor     Standing balance support: Bilateral upper extremity supported Standing balance-Leahy Scale: Fair                               Pertinent Vitals/Pain Pain Assessment: No/denies pain    Home Living Family/patient expects to be discharged to:: Private residence Living Arrangements: Children (daqughter)   Type of Home: Apartment       Home Layout: Two level Home Equipment: Wheelchair - [employer] - 2 wheels;Grab bars - tub/shower      Prior Function Level of Independence: Needs assistance   Gait / Transfers Assistance Needed: With assistance and Gilford Rile (2 wheeled)  ADL's / Homemaking Assistance Needed: Pt reports that her daughter assists with 'everything,' both BADLs and IADLs.        Hand Dominance   Dominant Hand: Right    Extremity/Trunk Assessment   Upper Extremity Assessment: Generalized weakness           Lower Extremity Assessment: Generalized weakness  Cervical / Trunk Assessment: Kyphotic  Communication      Cognition Arousal/Alertness: Awake/alert Behavior During Therapy: WFL for tasks assessed/performed Overall Cognitive Status: History of cognitive impairments - at baseline                      General Comments      Exercises        Assessment/Plan    PT Assessment Patient needs continued PT services  PT Diagnosis Difficulty walking;Abnormality of gait;Generalized weakness   PT Problem List Decreased strength;Decreased  activity tolerance;Decreased balance;Decreased mobility;Decreased cognition;Decreased safety awareness;Cardiopulmonary status limiting activity;Impaired tone  PT Treatment Interventions Gait training;Functional mobility training;Therapeutic exercise;Patient/family education   PT Goals (Current goals can be found in the Care Plan section) Acute Rehab PT Goals Patient Stated Goal: none PT Goal Formulation: With patient Time For Goal Achievement: 01/11/15 Potential to Achieve Goals: Fair    Frequency Min 3X/week   Barriers to discharge   none    Co-evaluation               End of Session Equipment Utilized During Treatment: Gait belt;Oxygen Activity Tolerance: Patient tolerated treatment well Patient left: in chair;with call bell/phone within reach;with chair alarm set Nurse Communication: Mobility status         Time: 1430-1506 PT Time Calculation (min) (ACUTE ONLY): 36 min   Charges:   PT Evaluation $Initial PT Evaluation Tier I: 1 Procedure     PT G CodesSable Feil 12/28/2014, 3:18 PM

## 2014-12-29 LAB — BASIC METABOLIC PANEL
Anion gap: 9 (ref 5–15)
BUN: 60 mg/dL — AB (ref 6–23)
CHLORIDE: 103 mmol/L (ref 96–112)
CO2: 27 mmol/L (ref 19–32)
Calcium: 7.9 mg/dL — ABNORMAL LOW (ref 8.4–10.5)
Creatinine, Ser: 3.62 mg/dL — ABNORMAL HIGH (ref 0.50–1.10)
GFR calc non Af Amer: 12 mL/min — ABNORMAL LOW (ref >90)
GFR, EST AFRICAN AMERICAN: 13 mL/min — AB (ref >90)
Glucose, Bld: 132 mg/dL — ABNORMAL HIGH (ref 70–99)
POTASSIUM: 3.9 mmol/L (ref 3.5–5.1)
Sodium: 139 mmol/L (ref 135–145)

## 2014-12-29 LAB — GLUCOSE, CAPILLARY
Glucose-Capillary: 113 mg/dL — ABNORMAL HIGH (ref 70–99)
Glucose-Capillary: 141 mg/dL — ABNORMAL HIGH (ref 70–99)
Glucose-Capillary: 174 mg/dL — ABNORMAL HIGH (ref 70–99)
Glucose-Capillary: 187 mg/dL — ABNORMAL HIGH (ref 70–99)

## 2014-12-29 NOTE — Progress Notes (Signed)
PROGRESS NOTE  [name] LOV:[id number] DOB: 1941/07/08 DOA: 12/22/2014 PCP: [name], MD  Summary: 74 year old woman with history of seizure disorder who presented to the emergency department after choking on some water and then having a seizure. In the emergency department she was found to have acute renal failure. Diuretic and ACE inhibitor were held and she was admitted for further evaluation. She had no recurrent seizures. Keppra was continued. She was seen by nephrology with some improvement in renal function but not yet back to baseline.  Assessment/Plan: 1. Acute kidney injury superimposed on on chronic kidney disease stage IV. Continues to slowly improve. Prerenal etiology suspected. ACE inhibitor and diuretics stopped. Renal ultrasound was unremarkable. 2. Hypernatremia. Resolved with fluids. 3. Possible seizure prior to admission. Very much doubted--likely aspiration event. History of seizure disorder maintained on Keppra. EEG was unremarkable.  4. Suspected aspiration prior to admission, dysphagia. Diet per speech therapy. Got choked on ST testing 2/4 and had some generalized tremors at that time per speech therapy. No recurrence and chest x-ray without acute changes. Respiratory status appears stable. 5. Anemia of chronic kidney disease.  6. Diabetes mellitus type 2, remains stable.   Overall slowly improving. Agree with nephrology management. If continues to improve, possible transfer to skilled nursing facility next 1-2 days.  Code Status: full code DVT prophylaxis: Lovenox Family Communication: daughter at bedside Disposition Plan: return home with daughter  [name], MD  [facility]  Pager [phone] If 7PM-7AM, please contact night-coverage at [email or web address], password [facility] 12/29/2014, 3:25 PM  LOS: 7 days   Consultants:  Nephrology  Speech therapy: Dysphagia 3 diet, thin liquids  PT: SNF/HH  Procedures:  EEG IMPRESSION: The recording  shows mild generalized slowing indicating a mild generalized encephalopathy. However, there are no epileptiform activity observed.  Antibiotics:    HPI/Subjective: Feeling better. No issues overnight. Eating well. Breathing well. No complaints.  Objective: Filed Vitals:   12/28/14 1416 12/28/14 2122 12/29/14 0525 12/29/14 1504  BP: 144/54 134/61 124/62 128/71  Pulse: 76 75 69 77  Temp: 98.6 F (37 C) 98.3 F (36.8 C) 98 F (36.7 C) 98.4 F (36.9 C)  TempSrc: Oral Oral Oral Oral  Resp: 18 20 20 20   Height:      Weight:   81.829 kg (180 lb 6.4 oz)   SpO2: 98% 98% 100% 100%    Intake/Output Summary (Last 24 hours) at 12/29/14 1525 Last data filed at 12/29/14 1505  Gross per 24 hour  Intake    363 ml  Output    650 ml  Net   -287 ml     Filed Weights   12/27/14 0507 12/28/14 0445 12/29/14 0525  Weight: 75.206 kg (165 lb 12.8 oz) 79 kg (174 lb 2.6 oz) 81.829 kg (180 lb 6.4 oz)    Exam:     Afebrile, vital signs stable. General:  Appears calm and comfortable Cardiovascular: RRR, no m/r/g.  Respiratory: CTA bilaterally, no w/r/r. Normal respiratory effort. Speaks in full sentences. Psychiatric: grossly normal mood and affect, speech fluent and appropriate  Data Reviewed:  Adequate urine output.  Blood sugars remain stable.  Normal potassium. Creatinine slowly improving.  Pertinent data:  Labs  Sodium 155 >> >> 136  Creatinine 4.29 >> >> 3.95 >> 3.62 Imaging   Renal ultrasound unremarkable  CT head no acute intracranial process.  Chest x-ray no acute abnormalities repeat 2/4 Other    Pending data:    Scheduled Meds: . calcitRIOL  0.25 mcg Oral  Daily  . donepezil  10 mg Oral QHS  . enoxaparin (LOVENOX) injection  30 mg Subcutaneous Q24H  . folic acid  1 mg Oral q morning - 10a  . furosemide  40 mg Oral Daily  . hydrALAZINE  75 mg Oral 3 times per day  . insulin aspart  0-5 Units Subcutaneous QHS  . insulin aspart  0-9 Units Subcutaneous  TID WC  . insulin detemir  5 Units Subcutaneous q1800  . isosorbide mononitrate  15 mg Oral q morning - 10a  . levETIRAcetam  500 mg Oral BID  . metoprolol succinate  50 mg Oral q morning - 10a  . pantoprazole  40 mg Oral q morning - 10a  . pravastatin  20 mg Oral QHS  . sodium bicarbonate  650 mg Oral TID  . sodium chloride  3 mL Intravenous Q12H   Continuous Infusions:    Active Problems:   Hypertension   Diabetes mellitus   Dementia   Chronic kidney disease (CKD), stage IV (severe)   Seizure   Acute renal failure   Anemia   Hypernatremia   Dysphagia   Time spent 20 minutes

## 2014-12-29 NOTE — Progress Notes (Signed)
Subjective: Interval History: Patient offers no complaints.  Objective: Vital signs in last 24 hours: Temp:  [98 F (36.7 C)-98.6 F (37 C)] 98 F (36.7 C) (02/05 0525) Pulse Rate:  [69-76] 69 (02/05 0525) Resp:  [18-20] 20 (02/05 0525) BP: (124-144)/(54-62) 124/62 mmHg (02/05 0525) SpO2:  [98 %-100 %] 100 % (02/05 0525) Weight:  [81.829 kg (180 lb 6.4 oz)] 81.829 kg (180 lb 6.4 oz) (02/05 0525) Weight change: 2.829 kg (6 lb 3.8 oz)  Intake/Output from previous day: 02/04 0701 - 02/05 0700 In: 6 [I.V.:6] Out: 650 [Urine:650] Intake/Output this shift:    Patient is alert. Patient shakes her head for answering questions. Presently she doesn't seem to be in any apparent distress. Chest: Decreased breath sound no wheezing   her heart exam regular rate and rhythm Extremities trace edema  Lab Results:  Recent Labs  12/27/14 0628  WBC 4.5  HGB 9.9*  HCT 34.4*  PLT 150   BMET:   Recent Labs  12/28/14 0636 12/29/14 0619  NA 136 139  K 3.3* 3.9  CL 101 103  CO2 26 27  GLUCOSE 200* 132*  BUN 60* 60*  CREATININE 3.68* 3.62*  CALCIUM 7.5* 7.9*   No results for input(s): PTH in the last 72 hours. Iron Studies: No results for input(s): IRON, TIBC, TRANSFERRIN, FERRITIN in the last 72 hours.  Studies/Results: Dg Chest Port 1 View  12/28/2014   CLINICAL DATA:  Aspiration and airway.  Weakness.  EXAM: PORTABLE CHEST - 1 VIEW  COMPARISON:  12/22/2014  FINDINGS: There is moderate cardiac enlargement. Previous median sternotomy and CABG procedure. Pulmonary vascular congestion is similar to previous exam. No superimposed airspace consolidation. The visualized osseous structures are unremarkable.  IMPRESSION: 1. Cardiac enlargement and pulmonary vascular congestion.   Electronically Signed   By: Kerby Moors M.D.   On: 12/28/2014 18:43    I have reviewed the patient's current medications.  Assessment/Plan: Problem #1 acute kidney injury possibly secondary to prerenal  syndrome. Her renal function slowly improving. Presently patient denies any nausea or vomiting. Problem #2 hypernatremia: Her sodium has corrected. Problem #3 hypertension: Her blood pressure is reasonably controlled Problem #4 chronic renal failure stage IV. Presently patient denies any nausea or vomiting. Problem #5 diabetes. Problem 6 history of seizure disorder Problem #7 metabolic bone disease: Her calcium and her phosphorus is  in  Range Problem #8 anemia: Her hemoglobin and hematocrit is within our target goal. He is stable. Problem #9 hypokalemia most likely from diuretics. Her potassium has corrected. Plan: We'll continue with Lasix We'll DC her IV fluid. We'll check her basic metabolic panel in the morning    LOS: 7 days   [name] S 12/29/2014,9:14 AM

## 2014-12-29 NOTE — Clinical Social Work Placement (Signed)
Clinical Social Work Department CLINICAL SOCIAL WORK PLACEMENT NOTE 12/29/2014  Patient:  [name], [name]  Account Number:  [id number] Admit date:  12/22/2014  Clinical Social Worker:  Benay Pike, LCSW  Date/time:  12/29/2014 10:58 AM  Clinical Social Work is seeking post-discharge placement for this patient at the following level of care:   [place]   (*CSW will update this form in Epic as items are completed)   12/29/2014  Patient/family provided with [place] Department of Clinical Social Work's list of facilities offering this level of care within the geographic area requested by the patient (or if unable, by the patient's family).  12/29/2014  Patient/family informed of their freedom to choose among providers that offer the needed level of care, that participate in Medicare, Medicaid or managed care program needed by the patient, have an available bed and are willing to accept the patient.  12/29/2014  Patient/family informed of [facility]' ownership interest in [facility], as well as of the fact that they are under no obligation to receive care at this facility.  PASARR submitted to EDS on  PASARR number received on   FL2 transmitted to all facilities in geographic area requested by pt/family on  12/29/2014 FL2 transmitted to all facilities within larger geographic area on   Patient informed that his/her managed care company has contracts with or will negotiate with  certain facilities, including the following:     Patient/family informed of bed offers received:  12/29/2014 Patient chooses bed at [place] Physician recommends and patient chooses bed at    Patient to be transferred to  on   Patient to be transferred to facility by  Patient and family notified of transfer on  Name of family member notified:    The following physician request were entered in Epic:   Additional Comments: Pt has existing pasarr number.  Benay Pike,  [place]

## 2014-12-29 NOTE — Clinical Social Work Psychosocial (Signed)
Clinical Social Work Department BRIEF PSYCHOSOCIAL ASSESSMENT 12/29/2014  Patient:  [name], [name]     Account Number:  [id number]     Admit date:  12/22/2014  Clinical Social Worker:  Wyatt Haste  Date/Time:  12/29/2014 11:07 AM  Referred by:  Care Management  Date Referred:  12/29/2014 Referred for  SNF Placement   Other Referral:   Interview type:  Family Other interview type:   [name]- daughter    PSYCHOSOCIAL DATA Living Status:  FAMILY Admitted from facility:   Level of care:   Primary support name:  Vicky Primary support relationship to patient:  CHILD, ADULT Degree of support available:   supporitve    CURRENT CONCERNS Current Concerns  Post-Acute Placement   Other Concerns:    SOCIAL WORK ASSESSMENT / PLAN CSW spoke with pt's daughter [name] as pt was getting bath. Vicky reports she is having car problems and is waiting on a rental and should be up here later today. Pt lives with [name] and family is with her around the clock. Pt came to ED after an episode of shaking. Also found to have acute renal failure. At baseline, pt ambulates with a walker. Vicky describes her as "slow moving." She requires assist with bathing. PT evaluated pt yesterday and felt pt could be appropriate for home health or SNF. After discussion, she requests SNF. CSW left SNF list in room. She is aware of Medicare coverage/criteria and requests [place] placement. Vicky accepts bed at [facility] and should be ready for d/c tomorrow per MD. Burman Nieves notified.   Assessment/plan status:  Psychosocial Support/Ongoing Assessment of Needs Other assessment/ plan:   Information/referral to community resources:   SNF list    PATIENT'S/FAMILY'S RESPONSE TO PLAN OF CARE: Vicky requests short term SNF but plans on pt returning home after rehab.       Benay Pike, [place]

## 2014-12-29 NOTE — Progress Notes (Signed)
Nutrition Brief Note  Patient identified on the Malnutrition Screening Tool (MST) Report. Admitted with possible seizure from home.   Wt Readings from Last 15 Encounters:  12/29/14 180 lb 6.4 oz (81.829 kg)  10/25/14 173 lb 12.8 oz (78.835 kg)  10/16/14 154 lb 3.2 oz (69.945 kg)  10/07/14 130 lb (58.968 kg)  09/26/14 128 lb 3.2 oz (58.151 kg)  09/04/14 135 lb (61.236 kg)  08/14/14 126 lb 4.8 oz (57.289 kg)  07/27/14 131 lb 6.4 oz (59.603 kg)  07/20/14 140 lb 3.2 oz (63.594 kg)  06/26/14 138 lb 7.2 oz (62.8 kg)  06/16/14 131 lb (59.421 kg)  06/12/14 133 lb 6.1 oz (60.5 kg)  05/02/14 143 lb 1.3 oz (64.9 kg)  03/18/14 144 lb 2.9 oz (65.4 kg)  03/08/14 151 lb (68.493 kg)    Body mass index is 29.13 kg/(m^2). Patient meets criteria for overweight based on current BMI. Weight are highly variable question actual baseline weight??  Appetite reported as good. Pt current diet order is Dysphagia 3 with Nectar-thick liquids, patient is consuming approximately 50-100% of meals at this time.   Labs and medications reviewed. Pt has accepted a bed at [facility] and will potentially be d/c tomorrow.  Colman Cater MS,RD,CSG,LDN Office: [phone] Pager: [phone]

## 2014-12-29 NOTE — Progress Notes (Addendum)
Speech Language Pathology Treatment: Dysphagia  Patient Details Name: [name] MRN: [id number] DOB: 03/09/1941 Today's Date: 12/29/2014 Time: 6301-6010 SLP Time Calculation (min) (ACUTE ONLY): 12 min  Assessment / Plan / Recommendation Clinical Impression  Dysphagia treatment provided today for diet tolerance check/ education for compensatory strategies. Pt was eating dinner during treatment session. Pt was initially in a poor position leaning against side of bed; aided in sitting pt upright and elevated HOB. Pt presented with a wet vocal quality prior to observation of her consuming POs. Cued pt to cough/ clear throat and swallow again- vocal quality improved after cues. Pt demonstrated no overt s/s of aspiration; however, wet vocal quality returned after several sips of nectar thick liquid. Pt needs full supervision during meals to provide cues to clear throat and swallow twice following each bite/ sip. Recommend continuing dysphagia 3/ nectar thick liquid diet, meds crushed in puree, full supervision to cue for strategies described above. Home health SLP follow-up continues to be appropriate. Will continue to follow for duration of stay in hospital.   HPI HPI: [name] is a 74 yo woman who was admitted after possible seizure at home while drinking water. She has acute on chronic kidney disease stage 4. Likely pre renal. Patient was taking ARB/demadex prior to admission. Continue IV fluids. Some improvement in renal function today. Renal ultrasound without obstruction. Appreciate nephrology assistance. Poss Seizure. Patient had a spell after a choking episode at home. She was not post ictal and did not have any bladder/bowel incontinence. Although the patient does have a seizure disorder, it is unlikely that she had a seizure. Will continue to monitor. Continue Keppra. Pt with hypernatremia, DM2, and sliding scale. Daughter reports that pt gets "strangled" on water, but not soda. SLP asked  to evaluate swallow function.    Pertinent Vitals Pain Assessment: No/denies pain  SLP Plan  Continue with current plan of care    Recommendations Diet recommendations: Dysphagia 3 (mechanical soft);Nectar-thick liquid Liquids provided via: Cup;No straw Medication Administration: Crushed with puree Supervision: Patient able to self feed;Full supervision/cueing for compensatory strategies Compensations: Slow rate;Small sips/bites;Multiple dry swallows after each bite/sip Postural Changes and/or Swallow Maneuvers: Seated upright 90 degrees;Upright 30-60 min after meal              Oral Care Recommendations: Oral care BID Follow up Recommendations: Home health SLP Plan: Continue with current plan of care    GO     Kimberly Reap, MA, CCC-SLP 12/29/2014, 6:09 PM

## 2014-12-29 NOTE — Clinical Social Work Placement (Signed)
Clinical Social Work Department CLINICAL SOCIAL WORK PLACEMENT NOTE 12/29/2014  Patient:  ALANAH, SAKUMA  Account Number:  [id number] Admit date:  12/22/2014  Clinical Social Worker:  Benay Pike, LCSW  Date/time:  12/29/2014 10:58 AM  Clinical Social Work is seeking post-discharge placement for this patient at the following level of care:   [place]   (*CSW will update this form in Epic as items are completed)   12/29/2014  Patient/family provided with [place] Department of Clinical Social Work's list of facilities offering this level of care within the geographic area requested by the patient (or if unable, by the patient's family).  12/29/2014  Patient/family informed of their freedom to choose among providers that offer the needed level of care, that participate in Medicare, Medicaid or managed care program needed by the patient, have an available bed and are willing to accept the patient.  12/29/2014  Patient/family informed of [facility]' ownership interest in [facility], as well as of the fact that they are under no obligation to receive care at this facility.  PASARR submitted to EDS on  PASARR number received on   FL2 transmitted to all facilities in geographic area requested by pt/family on  12/29/2014 FL2 transmitted to all facilities within larger geographic area on   Patient informed that his/her managed care company has contracts with or will negotiate with  certain facilities, including the following:     Patient/family informed of bed offers received:   Patient chooses bed at  Physician recommends and patient chooses bed at    Patient to be transferred to  on   Patient to be transferred to facility by  Patient and family notified of transfer on  Name of family member notified:    The following physician request were entered in Epic:   Additional Comments: Pt has existing pasarr number.  Benay Pike, [place]

## 2014-12-30 LAB — GLUCOSE, CAPILLARY
GLUCOSE-CAPILLARY: 192 mg/dL — AB (ref 70–99)
GLUCOSE-CAPILLARY: 213 mg/dL — AB (ref 70–99)
Glucose-Capillary: 248 mg/dL — ABNORMAL HIGH (ref 70–99)

## 2014-12-30 LAB — CREATININE, SERUM
Creatinine, Ser: 3.85 mg/dL — ABNORMAL HIGH (ref 0.50–1.10)
GFR calc Af Amer: 12 mL/min — ABNORMAL LOW (ref >90)
GFR calc non Af Amer: 11 mL/min — ABNORMAL LOW (ref >90)

## 2014-12-30 LAB — BASIC METABOLIC PANEL
ANION GAP: 7 (ref 5–15)
BUN: 63 mg/dL — AB (ref 6–23)
CO2: 23 mmol/L (ref 19–32)
Calcium: 7.8 mg/dL — ABNORMAL LOW (ref 8.4–10.5)
Chloride: 104 mmol/L (ref 96–112)
Creatinine, Ser: 3.62 mg/dL — ABNORMAL HIGH (ref 0.50–1.10)
GFR calc Af Amer: 13 mL/min — ABNORMAL LOW (ref >90)
GFR calc non Af Amer: 12 mL/min — ABNORMAL LOW (ref >90)
Glucose, Bld: 159 mg/dL — ABNORMAL HIGH (ref 70–99)
Potassium: 3.8 mmol/L (ref 3.5–5.1)
SODIUM: 134 mmol/L — AB (ref 135–145)

## 2014-12-30 NOTE — Progress Notes (Signed)
PROGRESS NOTE  [name] FXT:[id number] DOB: October 18, 1941 DOA: 12/22/2014 PCP: [name], MD  Summary: 74 year old woman with history of seizure disorder who presented to the emergency department after choking on some water and then having a seizure. In the emergency department she was found to have acute renal failure. Diuretic and ACE inhibitor were held and she was admitted for further evaluation. She had no recurrent seizures. Keppra was continued. She was seen by nephrology with some improvement in renal function but not yet back to baseline.  Assessment/Plan: 1. Acute kidney injury superimposed on on chronic kidney disease stage IV. Thought to be prerenal in etiology. ACE inhibitor and diuretics stopped. Renal ultrasound was unremarkable. Renal function has been stable since 1/31 without significant further improvement. 2. Hypernatremia. Resolved with fluids. 3. Possible seizure prior to admission. Very much doubted--likely aspiration event. History of seizure disorder maintained on Keppra. EEG was unremarkable.  4. Suspected aspiration prior to admission, dysphagia. Diet per speech therapy. Got choked on ST testing 2/4 and had some generalized tremors at that time per speech therapy. No recurrence and chest x-ray without acute changes. Respiratory status appears stable. 5. Anemia of chronic kidney disease.  6. Diabetes mellitus type 2, remains stable.   Nephrology recommends oral Lasix. May be at baseline at this point.  Overall appears to be stable, plan discharge to/7.  Code Status: full code DVT prophylaxis: Lovenox Family Communication: daughter at bedside Disposition Plan: return home with daughter  [name], MD  [facility]  Pager [phone] If 7PM-7AM, please contact night-coverage at [email or web address], password [facility] 12/30/2014, 3:34 PM  LOS: 8 days   Consultants:  Nephrology  Speech therapy: Dysphagia 3 diet, thin liquids Pt needs full supervision  during meals to provide cues to clear throat and swallow twice following each bite/ sip. Recommend continuing dysphagia 3/ nectar thick liquid diet, meds crushed in puree, full supervision to cue for strategies described above. Home health SLP follow-up continues to be appropriate.   PT: SNF/HH  Procedures:  EEG IMPRESSION: The recording shows mild generalized slowing indicating a mild generalized encephalopathy. However, there are no epileptiform activity observed.  Antibiotics:    HPI/Subjective: No complaints. Eating well. Breathing fine. She has decided she wants to go home.  Objective: Filed Vitals:   12/29/14 2049 12/30/14 0404 12/30/14 0500 12/30/14 1030  BP: 134/57 129/65  135/85  Pulse: 79 42  76  Temp: 99.2 F (37.3 C) 99.3 F (37.4 C)    TempSrc: Oral Oral    Resp: 20 14    Height:      Weight:   77.9 kg (171 lb 11.8 oz)   SpO2: 96% 100%      Intake/Output Summary (Last 24 hours) at 12/30/14 1534 Last data filed at 12/30/14 0937  Gross per 24 hour  Intake    480 ml  Output    700 ml  Net   -220 ml     Filed Weights   12/28/14 0445 12/29/14 0525 12/30/14 0500  Weight: 79 kg (174 lb 2.6 oz) 81.829 kg (180 lb 6.4 oz) 77.9 kg (171 lb 11.8 oz)    Exam:     Afebrile, vital signs stable. General:  Appears comfortable, calm. Cardiovascular: Regular rate and rhythm, no murmur, rub or gallop. No lower extremity edema. Respiratory: Clear to auscultation bilaterally, no wheezes, rales or rhonchi. Normal respiratory effort. Psychiatric: grossly normal mood and affect, speech fluent and appropriate  Data Reviewed:  Adequate urine output.  Blood sugars remain  stable.  Normal potassium. Creatinine slowly improving.  Pertinent data:  Labs  Sodium 155 >> >> 136  Creatinine 4.29 >> >> 3.95 >> 3.62 Imaging   Renal ultrasound unremarkable  CT head no acute intracranial process.  Chest x-ray no acute abnormalities repeat 2/4 Other    Pending  data:    Scheduled Meds: . calcitRIOL  0.25 mcg Oral Daily  . donepezil  10 mg Oral QHS  . enoxaparin (LOVENOX) injection  30 mg Subcutaneous Q24H  . folic acid  1 mg Oral q morning - 10a  . furosemide  40 mg Oral Daily  . hydrALAZINE  75 mg Oral 3 times per day  . insulin aspart  0-5 Units Subcutaneous QHS  . insulin aspart  0-9 Units Subcutaneous TID WC  . insulin detemir  5 Units Subcutaneous q1800  . isosorbide mononitrate  15 mg Oral q morning - 10a  . levETIRAcetam  500 mg Oral BID  . metoprolol succinate  50 mg Oral q morning - 10a  . pantoprazole  40 mg Oral q morning - 10a  . pravastatin  20 mg Oral QHS  . sodium bicarbonate  650 mg Oral TID  . sodium chloride  3 mL Intravenous Q12H   Continuous Infusions:    Active Problems:   Hypertension   Diabetes mellitus   Dementia   Chronic kidney disease (CKD), stage IV (severe)   Seizure   Acute renal failure   Anemia   Hypernatremia   Dysphagia   Time spent 20 minutes

## 2014-12-30 NOTE — Progress Notes (Signed)
Subjective: Interval History: Patient denies any difficulty breathing. At this moment she offers no complaints.  Objective: Vital signs in last 24 hours: Temp:  [98.4 F (36.9 C)-99.3 F (37.4 C)] 99.3 F (37.4 C) (02/06 0404) Pulse Rate:  [42-79] 42 (02/06 0404) Resp:  [14-20] 14 (02/06 0404) BP: (128-134)/(57-71) 129/65 mmHg (02/06 0404) SpO2:  [96 %-100 %] 100 % (02/06 0404) Weight:  [77.9 kg (171 lb 11.8 oz)] 77.9 kg (171 lb 11.8 oz) (02/06 0500) Weight change: -3.929 kg (-8 lb 10.6 oz)  Intake/Output from previous day: 02/05 0701 - 02/06 0700 In: 600 [P.O.:600] Out: 700 [Urine:700] Intake/Output this shift:    Patient is alert. Patient shakes her head for answering questions. Presently she doesn't seem to be in any apparent distress. Chest: Decreased breath sound no wheezing   her heart exam regular rate and rhythm Extremities trace edema  Lab Results: No results for input(s): WBC, HGB, HCT, PLT in the last 72 hours. BMET:   Recent Labs  12/29/14 0619 12/30/14 0615 12/30/14 0656  NA 139 134*  --   K 3.9 3.8  --   CL 103 104  --   CO2 27 23  --   GLUCOSE 132* 159*  --   BUN 60* 63*  --   CREATININE 3.62* 3.62* 3.85*  CALCIUM 7.9* 7.8*  --    No results for input(s): PTH in the last 72 hours. Iron Studies: No results for input(s): IRON, TIBC, TRANSFERRIN, FERRITIN in the last 72 hours.  Studies/Results: Dg Chest Port 1 View  12/28/2014   CLINICAL DATA:  Aspiration and airway.  Weakness.  EXAM: PORTABLE CHEST - 1 VIEW  COMPARISON:  12/22/2014  FINDINGS: There is moderate cardiac enlargement. Previous median sternotomy and CABG procedure. Pulmonary vascular congestion is similar to previous exam. No superimposed airspace consolidation. The visualized osseous structures are unremarkable.  IMPRESSION: 1. Cardiac enlargement and pulmonary vascular congestion.   Electronically Signed   By: Kerby Moors M.D.   On: 12/28/2014 18:43    I have reviewed the patient's  current medications.  Assessment/Plan: Problem #1 acute kidney injury possibly secondary to prerenal syndrome. Her creatinine is in general is stable in no nausea or vomiting. At this moment this could be her baseline. Problem #2 hypernatremia: Her sodium has corrected. Problem #3 hypertension: Her blood pressure is reasonably controlled Problem #4 chronic renal failure stage IV. Presently she is asymptomatic. Problem #5 diabetes. Problem 6 history of seizure disorder: No recent history of seizure activity. Problem #7 metabolic bone disease: Her calcium and her phosphorus is  in  Range Problem #8 anemia: Her hemoglobin and hematocrit is within our target goal. He is stable. Problem #9 hypokalemia most likely from diuretics. Her potassium has corrected. Plan: We'll continue with oral Lasix. Presently patient does require dialysis. We'll check her basic metabolic panel in the morning    LOS: 8 days   Cloyce Paterson S 12/30/2014,8:27 AM

## 2014-12-31 DIAGNOSIS — D631 Anemia in chronic kidney disease: Secondary | ICD-10-CM

## 2014-12-31 DIAGNOSIS — N179 Acute kidney failure, unspecified: Secondary | ICD-10-CM

## 2014-12-31 LAB — BASIC METABOLIC PANEL
Anion gap: 2 — ABNORMAL LOW (ref 5–15)
BUN: 67 mg/dL — AB (ref 6–23)
CALCIUM: 8.1 mg/dL — AB (ref 8.4–10.5)
CO2: 27 mmol/L (ref 19–32)
CREATININE: 3.74 mg/dL — AB (ref 0.50–1.10)
Chloride: 110 mmol/L (ref 96–112)
GFR calc Af Amer: 13 mL/min — ABNORMAL LOW (ref >90)
GFR, EST NON AFRICAN AMERICAN: 11 mL/min — AB (ref >90)
Glucose, Bld: 205 mg/dL — ABNORMAL HIGH (ref 70–99)
POTASSIUM: 3.9 mmol/L (ref 3.5–5.1)
SODIUM: 139 mmol/L (ref 135–145)

## 2014-12-31 LAB — GLUCOSE, CAPILLARY
GLUCOSE-CAPILLARY: 189 mg/dL — AB (ref 70–99)
Glucose-Capillary: 162 mg/dL — ABNORMAL HIGH (ref 70–99)

## 2014-12-31 MED ORDER — TORSEMIDE 20 MG PO TABS: 40.0000 mg | ORAL_TABLET | Freq: Every day | ORAL | Status: DC

## 2014-12-31 MED ORDER — HYDRALAZINE HCL 25 MG PO TABS: 75.0000 mg | ORAL_TABLET | Freq: Three times a day (TID) | ORAL | Status: DC

## 2014-12-31 NOTE — Discharge Summary (Signed)
Physician Discharge Summary  [name] STM:[id number] DOB: 11-05-1941 DOA: 12/22/2014  PCP: Kimberly Font, MD  Admit date: 12/22/2014 Discharge date: 12/31/2014  Recommendations for Outpatient Follow-up:  1. Follow-up acute kidney injury superimposed on chronic kidney disease stage IV, has follow-up in 3 weeks with nephrology. 2. Per nephrology recommendations, Lasix stopped, Demadex dose reduced. 3. Follow-up home health speech therapy and physical therapy, patient declined skilled nursing facility placement 4. Anemia of chronic disease   Follow-up Information    Follow up with [facility].   Contact information:   [address] [place] [address] [phone]       Follow up with [facility] S, MD In 3 weeks.   Specialty:  Nephrology   Contact information:   [address] [address] [phone]       Follow up with Kimberly Font, MD.   Specialty:  Family Medicine   Why:  As needed   Contact information:   [address]  [place] [address] [phone]      Discharge Diagnoses:  1. Acute kidney injury superimposed on chronic kidney disease stage IV 2. Hypernatremia, dehydration 3. Possible seizure prior to admission 4. Suspected aspiration during hospitalization without sequela 5. Dysphagia 6. Anemia of chronic kidney disease 7. Diabetes mellitus type 2  Discharge Condition: improved Disposition: home with [facility], SNF   Diet recommendation: Dysphagia 3 diet, thin liquids Pt needs full supervision during meals to provide cues to clear throat and swallow twice following each bite/ sip.  Filed Weights   12/29/14 0525 12/30/14 0500 12/31/14 0500  Weight: 81.829 kg (180 lb 6.4 oz) 77.9 kg (171 lb 11.8 oz) 77 kg (169 lb 12.1 oz)    History of present illness:  74 year old woman with history of seizure disorder who presented to the emergency department after choking on some water and then having a  seizure. In the emergency department she was found to have acute renal failure.   Hospital Course:  [name] was admitted for further evaluation, diuretics were discontinued and kidney function gradually improved although did not return to baseline. Renal ultrasound was unremarkable. After further discussion with nephrology, plan is for discharge home. There is some question of seizure prior to admission but this is doubted based on history of suspected aspiration event. She had a similar episode in the hospital. EEG was unremarkable. Plan home health speech therapy. Hospitalization was uncomplicated. Individual issues as below.  1. Acute kidney injury superimposed on on chronic kidney disease stage IV. Thought to be prerenal in etiology. ACE inhibitor stopped. Renal ultrasound was unremarkable. Renal function has been stable since 1/31 without significant further improvement. Discussed with Dr. Patriciaann [name] discharge home with outpatient follow-up, recommends torsemide 40 mg daily. 2. Hypernatremia. Resolved with fluids. 3. Possible seizure prior to admission. Very much doubted--likely aspiration event. History of seizure disorder maintained on Keppra. EEG was unremarkable.  4. Suspected aspiration prior to admission, dysphagia. Diet per speech therapy. Got choked on ST testing 2/4 and had some generalized tremors at that time per speech therapy. No recurrence and chest x-ray without acute changes. Respiratory status remains stable. 5. Anemia of chronic kidney disease. Stable.  6. Diabetes mellitus type 2, remains stable.   Renal function baseline. Plan discharge home on Demadex 40 mg daily and stopping Lasix. Has required no significant potassium supplementation despite diuretic therapy. Given her renal dysfunction, will discontinue potassium supplementation for now.  Home today, patient declines SNF placement  Home with [facility], ST  Discussed with daughter by telephone  Discharge  Instructions  Discharge Instructions    Discharge instructions    Complete by:  As directed   Call your physician or seek immediate medical attention for fever, shortness of breath, seizures or worsening of condition. Recommend soft easy to chew diet, normal liquids. Full supervision with meals, remind her to clear her throat and swallow twice after each bite or sip. Crush medications in puree when able (but not all medications can be crushed--discuss with your pharmacist in regard to what medications can and cannot be crushed).     Increase activity slowly    Complete by:  As directed           Current Discharge Medication List    CONTINUE these medications which have CHANGED   Details  hydrALAZINE (APRESOLINE) 25 MG tablet Take 3 tablets (75 mg total) by mouth every 8 (eight) hours.    torsemide (DEMADEX) 20 MG tablet Take 2 tablets (40 mg total) by mouth daily. Qty: 60 tablet, Refills: 0      CONTINUE these medications which have NOT CHANGED   Details  calcitRIOL (ROCALTROL) 0.25 MCG capsule Take 1 capsule (0.25 mcg total) by mouth daily. Qty: 31 capsule, Refills: 0    hydrocortisone (ANUSOL-HC) 25 MG suppository Place 1 suppository (25 mg total) rectally 2 (two) times daily. Qty: 12 suppository, Refills: 0    isosorbide mononitrate (IMDUR) 30 MG 24 hr tablet Take 15 mg by mouth every morning.     metoprolol succinate (TOPROL-XL) 100 MG 24 hr tablet Take 50 mg by mouth every morning. Take with or immediately following a meal.    mupirocin ointment (BACTROBAN) 2 % Place 1 application into the nose 2 (two) times daily.    pantoprazole (PROTONIX) 40 MG tablet Take 40 mg by mouth every morning.    sodium bicarbonate 650 MG tablet Take 1,300 mg by mouth 3 (three) times daily.     donepezil (ARICEPT) 10 MG tablet Take 10 mg by mouth at bedtime.     folic acid (FOLVITE) 1 MG tablet Take 1 mg by mouth every morning.     insulin detemir (LEVEMIR) 100 UNIT/ML injection Inject 0.08  mLs (8 Units total) into the skin at bedtime. Qty: 10 mL, Refills: 0    levETIRAcetam (KEPPRA) 100 MG/ML solution Take 5 mLs by mouth 2 (two) times daily.    linagliptin (TRADJENTA) 5 MG TABS tablet Take 5 mg by mouth every morning.     pravastatin (PRAVACHOL) 20 MG tablet Take 20 mg by mouth at bedtime.       STOP taking these medications     furosemide (LASIX) 40 MG tablet      potassium chloride (K-DUR) 10 MEQ tablet        No Known Allergies  The results of significant diagnostics from this hospitalization (including imaging, microbiology, ancillary and laboratory) are listed below for reference.    Significant Diagnostic Studies: Ct Head Wo Contrast  12/23/2014   CLINICAL DATA:  Witnessed seizure 4 hr ago. Subsequent unresponsive. History of seizures, stroke, diabetes, chronic kidney disease.  EXAM: CT HEAD WITHOUT CONTRAST  TECHNIQUE: Contiguous axial images were obtained from the base of the skull through the vertex without intravenous contrast.  COMPARISON:  CT of the head March 15, 2014  FINDINGS: No intraparenchymal hemorrhage, mass effect, midline shift or acute large vascular territory infarct. RIGHT occipital encephalomalacia with mild ex vacuo dilatation the occipital horn of  the RIGHT lateral ventricle. Moderate to severe ventriculomegaly, generally on the basis of global parenchymal brain volume loss as there is overall commensurate enlargement of cerebral sulci and cerebellar folia. Disproportionate midbrain volume loss. Bilateral remote basal ganglia and thalamus lacunar infarcts. Moderate white matter changes. Basal cisterns are patent.  No paranasal sinus air-fluid levels. The mastoid air cells are well aerated. No skull fracture. Ocular globes and orbital contents are nonacute.  IMPRESSION: No acute intracranial process.  Moderate to severe brain volume loss, with suspected disproportionate midbrain atrophy.  Remote bilateral basal ganglia and thalamus lacunar infarcts.  Remote RIGHT posterior cerebral artery territory infarct. Moderate white matter changes can be seen with chronic small vessel ischemic disease.   Electronically Signed   By: Elon Alas   On: 12/23/2014 02:26   US Renal  12/23/2014   CLINICAL DATA:  Acute renal failure. History of diabetes and hypertension.  EXAM: RENAL/URINARY TRACT ULTRASOUND COMPLETE  COMPARISON:  None.  FINDINGS: Right Kidney:  Length: 12.4 cm. Increased parenchymal echogenicity. No mass or stone. No hydronephrosis.  Left Kidney:  Length: 11.8 cm. Increased parenchymal echogenicity. 8 mm nonobstructing stone in the lower pole. No mass. No hydronephrosis.  Bladder:  Limited evaluation can bladder not well differentiated from adjacent ascites.  IMPRESSION: Findings consistent with medical renal disease with echogenic kidneys bilaterally. No hydronephrosis. Nonobstructing stone in the lower pole of the left kidney. No renal masses.  Ascites.   Electronically Signed   By: Lajean Manes M.D.   On: 12/23/2014 12:41   Dg Chest Port 1 View  12/28/2014   CLINICAL DATA:  Aspiration and airway.  Weakness.  EXAM: PORTABLE CHEST - 1 VIEW  COMPARISON:  12/22/2014  FINDINGS: There is moderate cardiac enlargement. Previous median sternotomy and CABG procedure. Pulmonary vascular congestion is similar to previous exam. No superimposed airspace consolidation. The visualized osseous structures are unremarkable.  IMPRESSION: 1. Cardiac enlargement and pulmonary vascular congestion.   Electronically Signed   By: Kerby Moors M.D.   On: 12/28/2014 18:43   Dg Chest Port 1 View  12/22/2014   CLINICAL DATA:  Witnessed seizure. Choking sensation tonight. History of stroke, MI, chronic kidney disease, heart disease, ischemic cardiomyopathy.  EXAM: PORTABLE CHEST - 1 VIEW  COMPARISON:  10/22/2014  FINDINGS: Postoperative changes in the mediastinum. Mild cardiac enlargement with increased pulmonary vascularity. No evidence of significant edema or  consolidation. No blunting of costophrenic angles. No pneumothorax. Calcified and tortuous aorta.  IMPRESSION: Cardiac enlargement with mild pulmonary vascular congestion.   Electronically Signed   By: Lucienne Capers M.D.   On: 12/22/2014 23:36   Dg Swallowing Func-speech Pathology  12/26/2014   Ephraim Hamburger, CCC-SLP     12/26/2014  7:24 PM  Objective Swallowing Evaluation: Modified Barium Swallowing Study   Patient Details  Name: KAROLYNE TIMMONS MRN: [id number] Date of Birth: Apr 11, 1941  Today's Date: 12/26/2014 Time: SLP Start Time (ACUTE ONLY): 1315-SLP Stop Time (ACUTE  ONLY): 1345 SLP Time Calculation (min) (ACUTE ONLY): 30 min  Past Medical History:  Past Medical History  Diagnosis Date  . Type 2 diabetes mellitus   . Essential hypertension, benign   . History of stroke     Previously on Coumadin  . MI, old     Reported 45  . Seizure disorder   . Coronary atherosclerosis of native coronary artery     Multivessel status post CABG 2002  . History of GI bleed     Erosive gastritis and duodenitis by  EGD 2002  . Mixed hyperlipidemia   . Ischemic cardiomyopathy     LVEF 40-45% March 2013  . Dementia   . Stroke   . Seizures   . CKD (chronic kidney disease) stage 4, GFR 15-29 ml/min   . CHF (congestive heart failure)    Past Surgical History:  Past Surgical History  Procedure Laterality Date  . Coronary artery bypass graft  July 2002    LIMA to LAD, SVG to OM1, SVG to OM 2, SVG to RCA  . Tee without cardioversion  01/28/2012    Procedure: TRANSESOPHAGEAL ECHOCARDIOGRAM (TEE);  Surgeon:  Lelon Perla, MD;  Location: [facility] ENDOSCOPY;  Service:  Cardiovascular;  Laterality: [name];  . Colonoscopy  2002  . Esophagogastroduodenoscopy  2002  . Esophagogastroduodenoscopy [name] 01/18/2014    GBE:EFEOF hiatal hernia. Abnormal gastric and duodenal bulbar  mucosa of uncertain significance - status post gastric bx  (chronic gastritis/H.pylori +  . Colonoscopy [name] 06/25/2014    Dr. Laural Golden: tubular adenoma, pandiverticulosis.   . Agile  capsule [name] 07/06/2014    Procedure: AGILE CAPSULE;  Surgeon: Danie Binder, MD;   Location: [facility] ENDO SUITE;  Service: Endoscopy;  Laterality: [name];   730  . Esophagogastroduodenoscopy [name] 07/20/2014    Dr. Gala Romney: tiny gastric polyps not manipulated. minimal  pigmentation of duodenal bulb likely not significant  . Givens capsule study [name] 07/20/2014    incomplete   HPI:  HPI: Ms. Idolina Mantell is a 74 yo woman who was admitted after  possible seizure at home while drinking water. She has acute on  chronic kidney disease stage 4. Likely pre renal. Patient was  taking ARB/demadex prior to admission. Continue IV fluids. Some  improvement in renal function today. Renal ultrasound without  obstruction. Appreciate nephrology assistance. Poss Seizure.  Patient had a spell after a choking episode at home. She was not  post ictal and did not have any bladder/bowel incontinence.  Although the patient does have a seizure disorder, it is unlikely  that she had a seizure. Will continue to monitor. Continue  Keppra. Pt with hypernatremia, DM2, and sliding scale. Daughter  reports that pt gets "strangled" on water, but not soda. SLP  asked to evaluate swallow function.   No Data Recorded  Assessment / Plan / Recommendation CHL IP CLINICAL IMPRESSIONS 12/26/2014  Dysphagia Diagnosis Mild oral phase dysphagia;Moderate oral phase  dysphagia;Mild pharyngeal phase dysphagia Clinical impression Ms.  Naraine was seen for MBSS up in hausted chair this afternoon to  help determine safest diet and use of strategies as needed. She  presents with a mild/moderate oropharyngeal sensorimotor based  dysphagia characterized by delayed and inefficient oral transit  with both solids and liquids, lingual pumping, decreased bolus  cohesiveness, and decreased lingual to palatal contact. This  results in spillover of bolus to valleculae and pyriforms and  palatal and lingual residuals after the swallow which then pool  in pharynx. Pharyngeal phase is marked by  a delay in swallow  initiation as stated above, with swallow trigger after spilling  to pyriforms. There is no pharyngeal residue after the swallow  until oral residuals spill to the valleculae and pyriforms to  which pt does not appear sensate. Hyolaryngeal excursion appears  adequate. The oral residuals that spill after the primary swallow  are what become problematic. Pt presents with mild wet vocal  quality/congestion but has poor awareness of the same and  requires cues to "swallow again". Pt demonstrated only flash  penetration with  use of straw. Recommend D3/mech soft with thin  liquids via small cup sip. Pt needs to swallow 2x for each  bite/sip. It is often difficult for her to initiate a second  swallow, but is able to do after some delay. When pt presents  with congested/wet breathing/voice, cue pt to "swallow again" to  clear pharyngeal residue. SLP will follow while inpatient for  diet tolerance, pt/caregiver education, and implementation of  compensatory strategies.      CHL IP TREATMENT RECOMMENDATION 12/26/2014  Treatment Plan Recommendations Therapy as outlined in treatment  plan below     CHL IP DIET RECOMMENDATION 12/26/2014  Diet Recommendations Dysphagia 3 (Mechanical Soft);Thin liquid  Liquid Administration via Cup;No straw  Medication Administration Crushed with puree Compensations Slow  rate;Small sips/bites;Multiple dry swallows after each bite/sip  Postural Changes and/or Swallow Maneuvers Seated upright 90  degrees;Upright 30-60 min after meal     CHL IP OTHER RECOMMENDATIONS 12/26/2014  Recommended Consults (None)  Oral Care Recommendations Oral care BID  Other Recommendations Clarify dietary restrictions     CHL IP FOLLOW UP RECOMMENDATIONS 12/26/2014  Follow up Recommendations None     CHL IP FREQUENCY AND DURATION 12/26/2014  Speech Therapy Frequency (ACUTE ONLY) min 2x/week  Treatment Duration 1 week     Pertinent Vitals/Pain VSS    SLP Swallow Goals CHL IP SWALLOW STUDY GOALS 03/17/2014   Patient will consume recommended diet without observed clinical  signs of aspiration with Independent assistance  Swallow Study Goal #1 - Progress Met  Patient will utilize recommended strategies during swallow to  increase swallowing safety with Independent assistance  Swallow Study Goal #2 - Progress Met  Goal #3 (None)  Swallow Study Goal #3 - Progress (None)  Goal #4 (None)  Swallow Study Goal #4 - Progress (None)    No flowsheet data found.    CHL IP REASON FOR REFERRAL 12/26/2014  Reason for Referral Objectively evaluate swallowing function     CHL IP ORAL PHASE 12/26/2014  Lips (None)  Tongue (None)  Mucous membranes (None)  Nutritional status (None)  Other (None)  Oxygen therapy (None)  Oral Phase Impaired  Oral - Pudding Teaspoon (None)  Oral - Pudding Cup (None)  Oral - Honey Teaspoon (None)  Oral - Honey Cup (None)  Oral - Honey Syringe (None)  Oral - Nectar Teaspoon (None)  Oral - Nectar Cup Incomplete tongue to palate  contact;Lingual/palatal residue;Delayed oral transit  Oral - Nectar Straw (None)  Oral - Nectar Syringe (None)  Oral - Ice Chips (None)  Oral - Thin Teaspoon Delayed oral transit;Incomplete tongue to  palate contact  Oral - Thin Cup Delayed oral transit;Piecemeal  swallowing;Lingual/palatal residue;Weak lingual  manipulation;Lingual pumping;Holding of bolus  Oral - Thin Straw Lingual pumping;Incomplete tongue to palate  contact;Lingual/palatal residue  Oral - Thin Syringe (None)  Oral - Puree Lingual pumping;Reduced posterior propulsion;Holding  of bolus;Delayed oral transit  Oral - Mechanical Soft (None)  Oral - Regular Weak lingual manipulation;Lingual pumping;Reduced  posterior propulsion;Holding of bolus;Lingual/palatal  residue;Piecemeal swallowing;Delayed oral transit  Oral - Multi-consistency (None)  Oral - Pill (None)  Oral Phase - Comment (None)      CHL IP PHARYNGEAL PHASE 12/26/2014 Pharyngeal Phase Impaired  Pharyngeal - Pudding Teaspoon (None)  Penetration/Aspiration details  (pudding teaspoon) (None)  Pharyngeal - Pudding Cup (None)  Penetration/Aspiration details (pudding cup) (None)  Pharyngeal - Honey Teaspoon (None)  Penetration/Aspiration details (honey teaspoon) (None)  Pharyngeal - Honey Cup (None)  Penetration/Aspiration details (honey cup) (None) Pharyngeal -  Honey Syringe (None)  Penetration/Aspiration details (honey syringe) (None)  Pharyngeal - Nectar Teaspoon (None)  Penetration/Aspiration details (nectar teaspoon) (None)  Pharyngeal - Nectar Cup Delayed swallow initiation;Premature  spillage to valleculae;Premature spillage to pyriform  sinuses;Pharyngeal residue - valleculae;Pharyngeal residue -  pyriform sinuses  Penetration/Aspiration details (nectar cup) (None)  Pharyngeal - Nectar Straw (None)  Penetration/Aspiration details (nectar straw) (None)  Pharyngeal - Nectar Syringe (None)  Penetration/Aspiration details (nectar syringe) (None)  Pharyngeal - Ice Chips (None)  Penetration/Aspiration details (ice chips) (None)  Pharyngeal - Thin Teaspoon Delayed swallow initiation;Premature  spillage to valleculae;Premature spillage to pyriform sinuses  Penetration/Aspiration details (thin teaspoon) (None)  Pharyngeal - Thin Cup Delayed swallow initiation;Premature  spillage to valleculae;Premature spillage to pyriform  sinuses;Pharyngeal residue - valleculae;Pharyngeal residue -  pyriform sinuses  Penetration/Aspiration details (thin cup) (None)  Pharyngeal - Thin Straw Delayed swallow initiation;Premature  spillage to valleculae;Premature spillage to pyriform  sinuses;Penetration/Aspiration during swallow;Pharyngeal residue  - valleculae;Pharyngeal residue - pyriform sinuses  Penetration/Aspiration details (thin straw) Material enters  airway, remains ABOVE vocal cords then ejected out  Pharyngeal - Thin Syringe (None)  Penetration/Aspiration details (thin syringe') (None)  Pharyngeal - Puree Delayed swallow initiation;Premature spillage  to valleculae   Penetration/Aspiration details (puree) (None)  Pharyngeal - Mechanical Soft (None)  Penetration/Aspiration details (mechanical soft) (None)  Pharyngeal - Regular Delayed swallow initiation;Premature  spillage to valleculae;Premature spillage to pyriform sinuses  Penetration/Aspiration details (regular) (None)  Pharyngeal - Multi-consistency (None)  Penetration/Aspiration details (multi-consistency) (None)  Pharyngeal - Pill (None)  Penetration/Aspiration details (pill) (None)  Pharyngeal Comment (None)     CHL IP CERVICAL ESOPHAGEAL PHASE 12/26/2014  Cervical Esophageal Phase WFL  Pudding Teaspoon (None)  Pudding Cup (None)  Honey Teaspoon (None)  Honey Cup (None)  Honey Syringe (None)  Nectar Teaspoon (None)  Nectar Cup (None)  Nectar Straw (None)  Nectar Syringe (None)  Thin Teaspoon (None)  Thin Cup (None)  Thin Straw (None)  Thin Syringe (None)  Cervical Esophageal Comment (None)    No flowsheet data found.        Thank you,  Genene Churn, [place]  [place] 12/26/2014, 7:22 PM      Labs: Basic Metabolic Panel:  Recent Labs Lab 12/25/14 [phone]  12/27/14 4967 12/28/14 0636 12/29/14 5916 12/30/14 0615 12/30/14 0656 12/31/14 0635  NA 153*  < > 140 136 139 134*  --  139  K 3.8  < > 3.3* 3.3* 3.9 3.8  --  3.9  CL 119*  < > 103 101 103 104  --  110  CO2 26  < > 28 26 27 23   --  27  GLUCOSE 163*  < > 168* 200* 132* 159*  --  205*  BUN 62*  < > 59* 60* 60* 63*  --  67*  CREATININE 3.64*  < > 3.95* 3.68* 3.62* 3.62* 3.85* 3.74*  CALCIUM 8.0*  < > 7.7* 7.5* 7.9* 7.8*  --  8.1*  PHOS 4.3  --   --   --   --   --   --   --   < > = values in this interval not displayed.  CBC:  Recent Labs Lab 12/25/14 0712 12/27/14 0628  WBC 4.4 4.5  HGB 10.8* 9.9*  HCT 38.0 34.4*  MCV 85.4 82.3  PLT 114* 150   CBG:  Recent Labs Lab 12/30/14 1222 12/30/14 1631 12/30/14 2115 12/31/14 0731 12/31/14 1153  GLUCAP 192* 213* 248* 189* 162*    Principal Problem:  AKI (acute kidney  injury) Active Problems:   Hypertension   Diabetes mellitus   Dementia   Chronic kidney disease (CKD), stage IV (severe)   Anemia in stage 4 chronic kidney disease   Seizure   Anemia   Hypernatremia   Dysphagia   Time coordinating discharge: 35 minutes  Signed:  [name], MD [facility] 12/31/2014, 12:44 PM

## 2014-12-31 NOTE — Progress Notes (Signed)
Subjective: Interval History: Patient does not offer any complaints.  Objective: Vital signs in last 24 hours: Temp:  [98.7 F (37.1 C)-99.5 F (37.5 C)] 99.5 F (37.5 C) (02/06 2119) Pulse Rate:  [76-85] 85 (02/06 2119) Resp:  [15-20] 15 (02/06 2119) BP: (135-137)/(57-85) 136/74 mmHg (02/06 2119) SpO2:  [98 %-99 %] 99 % (02/06 2119) Weight:  [77 kg (169 lb 12.1 oz)] 77 kg (169 lb 12.1 oz) (02/07 0500) Weight change: -0.9 kg (-1 lb 15.7 oz)  Intake/Output from previous day: 02/06 0701 - 02/07 0700 In: 480 [P.O.:480] Out: 500 [Urine:500] Intake/Output this shift:    Patient is alert. Patient shakes her head for answering questions. Presently she doesn't seem to be in any apparent distress. Chest: Decreased breath sound no wheezing   her heart exam regular rate and rhythm Extremities trace edema  Lab Results: No results for input([name]): WBC, HGB, HCT, PLT in the last 72 hours. BMET:   Recent Labs  12/30/14 0615 12/30/14 0656 12/31/14 0635  NA 134*  --  139  K 3.8  --  3.9  CL 104  --  110  CO2 23  --  27  GLUCOSE 159*  --  205*  BUN 63*  --  67*  CREATININE 3.62* 3.85* 3.74*  CALCIUM 7.8*  --  8.1*   No results for input([name]): PTH in the last 72 hours. Iron Studies: No results for input([name]): IRON, TIBC, TRANSFERRIN, FERRITIN in the last 72 hours.  Studies/Results: No results found.  I have reviewed the patient'[name] current medications.  Assessment/Plan: Problem #1 acute kidney injury possibly secondary to prerenal syndrome. Her BUN and creatinine is slightly better. At this moment no significant change. Problem #2 hypernatremia: Her sodium has corrected. Problem #3 hypertension: Her blood pressure is reasonably controlled Problem #4 chronic renal failure stage IV. Presently she is asymptomatic. Problem #5 diabetes. Problem 6 history of seizure disorder: No recent history of seizure activity. Problem #7 metabolic bone disease: Her calcium and her phosphorus is  in   Range Problem #8 anemia: Her hemoglobin and hematocrit is within our target goal.  Problem #9 hypokalemia most likely from diuretics. Her potassium has corrected. Plan: We'll continue with oral Lasix. Presently patient does require dialysis. We'll check her basic metabolic panel in the morning We'll see patient in 3 weeks and make a decision about access placement.    LOS: 9 days   [name] 12/31/2014,8:52 AM

## 2014-12-31 NOTE — Progress Notes (Signed)
Pt's O2 saturation on room air 98%. Pt's breathing symmetrical and regular.  No acute distress noted.

## 2014-12-31 NOTE — Progress Notes (Signed)
PROGRESS NOTE  ROCSI [name] [name]:[id number] DOB: Jan 17, 1941 DOA: 12/22/2014 PCP: [name], MD  Summary: 74 year old woman with history of seizure disorder who presented to the emergency department after choking on some water and then having a seizure. In the emergency department she was found to have acute renal failure. Diuretic and ACE inhibitor were held and she was admitted for further evaluation. She had no recurrent seizures. Keppra was continued. She was seen by nephrology with some improvement in renal function but not yet back to baseline.  Assessment/Plan: 1. Acute kidney injury superimposed on on chronic kidney disease stage IV. Thought to be prerenal in etiology. ACE inhibitor stopped. Renal ultrasound was unremarkable. Renal function has been stable since 1/31 without significant further improvement. Discussed with Dr. Patriciaann Clan discharge home with outpatient follow-up, recommends torsemide 40 mg daily. 2. Hypernatremia. Resolved with fluids. 3. Possible seizure prior to admission. Very much doubted--likely aspiration event. History of seizure disorder maintained on Keppra. EEG was unremarkable.  4. Suspected aspiration prior to admission, dysphagia. Diet per speech therapy. Got choked on ST testing 2/4 and had some generalized tremors at that time per speech therapy. No recurrence and chest x-ray without acute changes. Respiratory status remains stable. 5. Anemia of chronic kidney disease. Stable.  6. Diabetes mellitus type 2, remains stable.   Doing well, asymptomatic. Respiratory status at baseline.  Renal function baseline. Plan discharge home on Demadex 40 mg daily and stopping Lasix. Has required no significant potassium supplementation despite diuretic therapy. Given her renal dysfunction, will discontinue potassium supplementation for now.  Home today, patient declines SNF placement  Home with HH PT, ST  Discussed with daughter by telephone   Kimberly Hodgkins, MD  [facility]  Pager [phone] If 7PM-7AM, please contact night-coverage at [email or web address], password [facility] 12/31/2014, 11:09 AM  LOS: 9 days   Consultants:  Nephrology  Speech therapy: Dysphagia 3 diet, thin liquids Pt needs full supervision during meals to provide cues to clear throat and swallow twice following each bite/ sip. Recommend continuing dysphagia 3/ nectar thick liquid diet, meds crushed in puree, full supervision to cue for strategies described above. Home health SLP follow-up continues to be appropriate.   PT: SNF/HH  Procedures:  EEG IMPRESSION: The recording shows mild generalized slowing indicating a mild generalized encephalopathy. However, there are no epileptiform activity observed.  Antibiotics:    HPI/Subjective: Doing well, no complaints. Eating fine. Ready to go home.  Objective: Filed Vitals:   12/30/14 1030 12/30/14 1539 12/30/14 2119 12/31/14 0500  BP: 135/85 137/57 136/74   Pulse: 76 80 85   Temp:  98.7 F (37.1 C) 99.5 F (37.5 C)   TempSrc:  Oral Oral   Resp:  20 15   Height:      Weight:    77 kg (169 lb 12.1 oz)  SpO2:  98% 99%     Intake/Output Summary (Last 24 hours) at 12/31/14 1109 Last data filed at 12/31/14 0911  Gross per 24 hour  Intake    243 ml  Output    500 ml  Net   -257 ml     Filed Weights   12/29/14 0525 12/30/14 0500 12/31/14 0500  Weight: 81.829 kg (180 lb 6.4 oz) 77.9 kg (171 lb 11.8 oz) 77 kg (169 lb 12.1 oz)    Exam:     Afebrile, vital signs stable. General:  Appears calm and comfortable Cardiovascular: RRR, no m/r/g. No LE edema. Telemetry: SR, no arrhythmias  Respiratory: CTA bilaterally, no  w/r/r. Normal respiratory effort. Musculoskeletal: appears to be at baseline Psychiatric: speech fluent and appropriate  Data Reviewed:  BUN and creatinine remained stable, no significant change in the last 72 hours. Potassium remains normal.  Blood sugars remain stable.  Pertinent  data:  Labs  Sodium 155 >> >> 136  Creatinine 4.29 >> >> 3.95 >> 3.62 >> 3.74 Imaging   Renal ultrasound unremarkable  CT head no acute intracranial process.  Chest x-ray no acute abnormalities repeat 2/4 Other    Pending data:    Scheduled Meds: . calcitRIOL  0.25 mcg Oral Daily  . donepezil  10 mg Oral QHS  . enoxaparin (LOVENOX) injection  30 mg Subcutaneous Q24H  . folic acid  1 mg Oral q morning - 10a  . furosemide  40 mg Oral Daily  . hydrALAZINE  75 mg Oral 3 times per day  . insulin aspart  0-5 Units Subcutaneous QHS  . insulin aspart  0-9 Units Subcutaneous TID WC  . insulin detemir  5 Units Subcutaneous q1800  . isosorbide mononitrate  15 mg Oral q morning - 10a  . levETIRAcetam  500 mg Oral BID  . metoprolol succinate  50 mg Oral q morning - 10a  . pantoprazole  40 mg Oral q morning - 10a  . pravastatin  20 mg Oral QHS  . sodium bicarbonate  650 mg Oral TID  . sodium chloride  3 mL Intravenous Q12H   Continuous Infusions:    Active Problems:   Hypertension   Diabetes mellitus   Dementia   Chronic kidney disease (CKD), stage IV (severe)   Seizure   Acute renal failure   Anemia   Hypernatremia   Dysphagia

## 2014-12-31 NOTE — Progress Notes (Signed)
Pt discharged in the care of her daughter. IV catheter removed and intact. IV site clean dry and intact. Foley catheter removed. Discharge instructions reviewed and discussed with patient's daughter. Medications reviewed and discussed. No further questions at this time. Patient to be escorted by nurse tech.

## 2015-01-01 NOTE — Progress Notes (Signed)
UR chart review completed.  

## 2015-01-02 ENCOUNTER — Inpatient Hospital Stay (HOSPITAL_COMMUNITY): Payer: Medicare Other

## 2015-01-02 ENCOUNTER — Encounter (HOSPITAL_COMMUNITY): Payer: Self-pay

## 2015-01-02 ENCOUNTER — Encounter (HOSPITAL_COMMUNITY): Payer: Self-pay | Admitting: Oncology

## 2015-01-02 ENCOUNTER — Inpatient Hospital Stay (HOSPITAL_COMMUNITY)
Admission: EM | Admit: 2015-01-02 | Discharge: 2015-01-09 | DRG: 189 | Disposition: A | Discharge status: Home Health | Payer: Medicare Other | Attending: Internal Medicine | Admitting: Internal Medicine

## 2015-01-02 ENCOUNTER — Emergency Department (HOSPITAL_COMMUNITY): Payer: Medicare Other

## 2015-01-02 DIAGNOSIS — I129 Hypertensive chronic kidney disease with stage 1 through stage 4 chronic kidney disease, or unspecified chronic kidney disease: Secondary | ICD-10-CM | POA: Diagnosis present

## 2015-01-02 DIAGNOSIS — D649 Anemia, unspecified: Secondary | ICD-10-CM | POA: Diagnosis not present

## 2015-01-02 DIAGNOSIS — R7989 Other specified abnormal findings of blood chemistry: Secondary | ICD-10-CM | POA: Diagnosis present

## 2015-01-02 DIAGNOSIS — J9621 Acute and chronic respiratory failure with hypoxia: Secondary | ICD-10-CM | POA: Diagnosis present

## 2015-01-02 DIAGNOSIS — F0390 Unspecified dementia without behavioral disturbance: Secondary | ICD-10-CM | POA: Diagnosis not present

## 2015-01-02 DIAGNOSIS — R0602 Shortness of breath: Secondary | ICD-10-CM | POA: Diagnosis present

## 2015-01-02 DIAGNOSIS — I255 Ischemic cardiomyopathy: Secondary | ICD-10-CM | POA: Diagnosis present

## 2015-01-02 DIAGNOSIS — R1310 Dysphagia, unspecified: Secondary | ICD-10-CM | POA: Diagnosis present

## 2015-01-02 DIAGNOSIS — N179 Acute kidney failure, unspecified: Secondary | ICD-10-CM | POA: Diagnosis present

## 2015-01-02 DIAGNOSIS — Z951 Presence of aortocoronary bypass graft: Secondary | ICD-10-CM

## 2015-01-02 DIAGNOSIS — D631 Anemia in chronic kidney disease: Secondary | ICD-10-CM | POA: Diagnosis present

## 2015-01-02 DIAGNOSIS — Z833 Family history of diabetes mellitus: Secondary | ICD-10-CM | POA: Diagnosis not present

## 2015-01-02 DIAGNOSIS — D509 Iron deficiency anemia, unspecified: Secondary | ICD-10-CM | POA: Diagnosis present

## 2015-01-02 DIAGNOSIS — N184 Chronic kidney disease, stage 4 (severe): Principal | ICD-10-CM

## 2015-01-02 DIAGNOSIS — E1121 Type 2 diabetes mellitus with diabetic nephropathy: Secondary | ICD-10-CM | POA: Diagnosis present

## 2015-01-02 DIAGNOSIS — I5040 Unspecified combined systolic (congestive) and diastolic (congestive) heart failure: Secondary | ICD-10-CM | POA: Diagnosis present

## 2015-01-02 DIAGNOSIS — Z823 Family history of stroke: Secondary | ICD-10-CM

## 2015-01-02 DIAGNOSIS — E1122 Type 2 diabetes mellitus with diabetic chronic kidney disease: Secondary | ICD-10-CM | POA: Diagnosis present

## 2015-01-02 DIAGNOSIS — E782 Mixed hyperlipidemia: Secondary | ICD-10-CM | POA: Diagnosis present

## 2015-01-02 DIAGNOSIS — I2510 Atherosclerotic heart disease of native coronary artery without angina pectoris: Secondary | ICD-10-CM | POA: Diagnosis present

## 2015-01-02 DIAGNOSIS — Z87891 Personal history of nicotine dependence: Secondary | ICD-10-CM

## 2015-01-02 DIAGNOSIS — I252 Old myocardial infarction: Secondary | ICD-10-CM

## 2015-01-02 DIAGNOSIS — Z8673 Personal history of transient ischemic attack (TIA), and cerebral infarction without residual deficits: Secondary | ICD-10-CM

## 2015-01-02 DIAGNOSIS — N2581 Secondary hyperparathyroidism of renal origin: Secondary | ICD-10-CM | POA: Diagnosis present

## 2015-01-02 DIAGNOSIS — E870 Hyperosmolality and hypernatremia: Secondary | ICD-10-CM | POA: Diagnosis not present

## 2015-01-02 DIAGNOSIS — R778 Other specified abnormalities of plasma proteins: Secondary | ICD-10-CM | POA: Diagnosis present

## 2015-01-02 DIAGNOSIS — N390 Urinary tract infection, site not specified: Secondary | ICD-10-CM | POA: Diagnosis present

## 2015-01-02 DIAGNOSIS — Z9981 Dependence on supplemental oxygen: Secondary | ICD-10-CM

## 2015-01-02 DIAGNOSIS — E876 Hypokalemia: Secondary | ICD-10-CM | POA: Diagnosis present

## 2015-01-02 DIAGNOSIS — E119 Type 2 diabetes mellitus without complications: Secondary | ICD-10-CM

## 2015-01-02 HISTORY — DX: Chronic respiratory failure with hypoxia: J96.11

## 2015-01-02 HISTORY — DX: Anemia in chronic kidney disease: D63.1

## 2015-01-02 LAB — BASIC METABOLIC PANEL
Anion gap: 7 (ref 5–15)
BUN: 67 mg/dL — AB (ref 6–23)
CO2: 30 mmol/L (ref 19–32)
Calcium: 8.8 mg/dL (ref 8.4–10.5)
Chloride: 112 mmol/L (ref 96–112)
Creatinine, Ser: 3.58 mg/dL — ABNORMAL HIGH (ref 0.50–1.10)
GFR calc Af Amer: 14 mL/min — ABNORMAL LOW (ref >90)
GFR, EST NON AFRICAN AMERICAN: 12 mL/min — AB (ref >90)
Glucose, Bld: 107 mg/dL — ABNORMAL HIGH (ref 70–99)
Potassium: 4.3 mmol/L (ref 3.5–5.1)
Sodium: 149 mmol/L — ABNORMAL HIGH (ref 135–145)

## 2015-01-02 LAB — CBC WITH DIFFERENTIAL/PLATELET
Basophils Absolute: 0 10*3/uL (ref 0.0–0.1)
Basophils Relative: 1 % (ref 0–1)
EOS PCT: 1 % (ref 0–5)
Eosinophils Absolute: 0 10*3/uL (ref 0.0–0.7)
HEMATOCRIT: 31.7 % — AB (ref 36.0–46.0)
Hemoglobin: 9.3 g/dL — ABNORMAL LOW (ref 12.0–15.0)
LYMPHS PCT: 26 % (ref 12–46)
Lymphs Abs: 1.3 10*3/uL (ref 0.7–4.0)
MCH: 24.3 pg — ABNORMAL LOW (ref 26.0–34.0)
MCHC: 29.3 g/dL — ABNORMAL LOW (ref 30.0–36.0)
MCV: 82.8 fL (ref 78.0–100.0)
MONO ABS: 0.5 10*3/uL (ref 0.1–1.0)
Monocytes Relative: 10 % (ref 3–12)
Neutro Abs: 3.3 10*3/uL (ref 1.7–7.7)
Neutrophils Relative %: 63 % (ref 43–77)
Platelets: 166 10*3/uL (ref 150–400)
RBC: 3.83 MIL/uL — AB (ref 3.87–5.11)
RDW: 26.8 % — ABNORMAL HIGH (ref 11.5–15.5)
WBC: 5.2 10*3/uL (ref 4.0–10.5)

## 2015-01-02 LAB — BLOOD GAS, ARTERIAL
ACID-BASE EXCESS: 3.8 mmol/L — AB (ref 0.0–2.0)
BICARBONATE: 28.4 meq/L — AB (ref 20.0–24.0)
Drawn by: 213101
O2 Content: 2 L/min
O2 Saturation: 97.9 %
PATIENT TEMPERATURE: 37
TCO2: 26.5 mmol/L (ref 0–100)
pCO2 arterial: 47.5 mmHg — ABNORMAL HIGH (ref 35.0–45.0)
pH, Arterial: 7.394 (ref 7.350–7.450)
pO2, Arterial: 115 mmHg — ABNORMAL HIGH (ref 80.0–100.0)

## 2015-01-02 LAB — GLUCOSE, CAPILLARY
Glucose-Capillary: 68 mg/dL — ABNORMAL LOW (ref 70–99)
Glucose-Capillary: 232 mg/dL — ABNORMAL HIGH (ref 70–99)

## 2015-01-02 LAB — BRAIN NATRIURETIC PEPTIDE

## 2015-01-02 LAB — TROPONIN I
Troponin I: 0.13 ng/mL — ABNORMAL HIGH (ref <0.031)
Troponin I: 0.12 ng/mL — ABNORMAL HIGH (ref <0.031)

## 2015-01-02 MED ORDER — ALBUTEROL SULFATE (2.5 MG/3ML) 0.083% IN NEBU
2.5000 mg | INHALATION_SOLUTION | Freq: Four times a day (QID) | RESPIRATORY_TRACT | Status: DC
  Administered 2015-01-02 – 2015-01-05 (×11): 2.5 mg via RESPIRATORY_TRACT
  Filled 2015-01-02 (×11): qty 3

## 2015-01-02 MED ORDER — SENNA 8.6 MG PO TABS
1.0000 | ORAL_TABLET | Freq: Two times a day (BID) | ORAL | Status: DC
  Administered 2015-01-02 – 2015-01-09 (×14): 8.6 mg via ORAL
  Filled 2015-01-02 (×15): qty 1

## 2015-01-02 MED ORDER — PRAVASTATIN SODIUM 10 MG PO TABS
20.0000 mg | ORAL_TABLET | Freq: Every day | ORAL | Status: DC
  Administered 2015-01-02 – 2015-01-09 (×8): 20 mg via ORAL
  Filled 2015-01-02 (×8): qty 2

## 2015-01-02 MED ORDER — HYDRALAZINE HCL 25 MG PO TABS
75.0000 mg | ORAL_TABLET | Freq: Three times a day (TID) | ORAL | Status: DC
  Administered 2015-01-02 – 2015-01-09 (×18): 75 mg via ORAL
  Filled 2015-01-02 (×17): qty 3

## 2015-01-02 MED ORDER — FOLIC ACID 1 MG PO TABS
1.0000 mg | ORAL_TABLET | Freq: Every morning | ORAL | Status: DC
  Administered 2015-01-03 – 2015-01-09 (×7): 1 mg via ORAL
  Filled 2015-01-02 (×7): qty 1

## 2015-01-02 MED ORDER — ACETAMINOPHEN 325 MG PO TABS: 650.0000 mg | ORAL_TABLET | Freq: Four times a day (QID) | ORAL | Status: DC | PRN

## 2015-01-02 MED ORDER — TECHNETIUM TO 99M ALBUMIN AGGREGATED
6.0000 | Freq: Once | INTRAVENOUS | Status: AC | PRN
  Administered 2015-01-02: 6.2 via INTRAVENOUS

## 2015-01-02 MED ORDER — INSULIN ASPART 100 UNIT/ML ~~LOC~~ SOLN
0.0000 [IU] | Freq: Three times a day (TID) | SUBCUTANEOUS | Status: DC
  Administered 2015-01-03: 2 [IU] via SUBCUTANEOUS

## 2015-01-02 MED ORDER — ENOXAPARIN SODIUM 40 MG/0.4ML ~~LOC~~ SOLN
40.0000 mg | SUBCUTANEOUS | Status: DC
  Administered 2015-01-02: 40 mg via SUBCUTANEOUS
  Filled 2015-01-02: qty 0.4

## 2015-01-02 MED ORDER — SODIUM CHLORIDE 0.9 % IJ SOLN
3.0000 mL | INTRAMUSCULAR | Status: DC | PRN
  Administered 2015-01-02: 3 mL via INTRAVENOUS
  Filled 2015-01-02: qty 3

## 2015-01-02 MED ORDER — INSULIN ASPART 100 UNIT/ML ~~LOC~~ SOLN
0.0000 [IU] | Freq: Every day | SUBCUTANEOUS | Status: DC
  Administered 2015-01-02 – 2015-01-08 (×4): 2 [IU] via SUBCUTANEOUS

## 2015-01-02 MED ORDER — ALUM & MAG HYDROXIDE-SIMETH 200-200-20 MG/5ML PO SUSP: 30.0000 mL | Freq: Four times a day (QID) | ORAL | Status: DC | PRN

## 2015-01-02 MED ORDER — FUROSEMIDE 10 MG/ML IJ SOLN
20.0000 mg | Freq: Once | INTRAMUSCULAR | Status: AC
  Administered 2015-01-02: 20 mg via INTRAVENOUS
  Filled 2015-01-02: qty 2

## 2015-01-02 MED ORDER — TORSEMIDE 20 MG PO TABS
40.0000 mg | ORAL_TABLET | Freq: Every day | ORAL | Status: DC
  Administered 2015-01-02 – 2015-01-03 (×2): 40 mg via ORAL
  Filled 2015-01-02 (×2): qty 2

## 2015-01-02 MED ORDER — INSULIN DETEMIR 100 UNIT/ML ~~LOC~~ SOLN
4.0000 [IU] | Freq: Every day | SUBCUTANEOUS | Status: DC
  Administered 2015-01-02 – 2015-01-09 (×8): 4 [IU] via SUBCUTANEOUS
  Filled 2015-01-02 (×8): qty 0.04

## 2015-01-02 MED ORDER — PANTOPRAZOLE SODIUM 40 MG PO TBEC
40.0000 mg | DELAYED_RELEASE_TABLET | Freq: Every morning | ORAL | Status: DC
  Administered 2015-01-03 – 2015-01-09 (×7): 40 mg via ORAL
  Filled 2015-01-02 (×7): qty 1

## 2015-01-02 MED ORDER — SODIUM BICARBONATE 650 MG PO TABS
1300.0000 mg | ORAL_TABLET | Freq: Three times a day (TID) | ORAL | Status: DC
  Administered 2015-01-02 – 2015-01-09 (×23): 1300 mg via ORAL
  Filled 2015-01-02 (×22): qty 2

## 2015-01-02 MED ORDER — METOPROLOL SUCCINATE ER 50 MG PO TB24
50.0000 mg | ORAL_TABLET | Freq: Every morning | ORAL | Status: DC
  Administered 2015-01-03 – 2015-01-09 (×7): 50 mg via ORAL
  Filled 2015-01-02 (×8): qty 1

## 2015-01-02 MED ORDER — DEXTROSE 5 % IV SOLN
INTRAVENOUS | Status: DC
  Administered 2015-01-02 – 2015-01-03 (×2): via INTRAVENOUS
  Administered 2015-01-04: 1 mL via INTRAVENOUS

## 2015-01-02 MED ORDER — FUROSEMIDE 10 MG/ML IJ SOLN: 20.0000 mg | Freq: Every day | INTRAMUSCULAR | Status: DC

## 2015-01-02 MED ORDER — TECHNETIUM TC 99M DIETHYLENETRIAME-PENTAACETIC ACID
40.0000 | Freq: Once | INTRAVENOUS | Status: AC | PRN
  Administered 2015-01-02: 40 via RESPIRATORY_TRACT

## 2015-01-02 MED ORDER — LEVETIRACETAM 250 MG PO TABS
500.0000 mg | ORAL_TABLET | Freq: Two times a day (BID) | ORAL | Status: DC
  Administered 2015-01-02 – 2015-01-09 (×15): 500 mg via ORAL
  Filled 2015-01-02 (×15): qty 2

## 2015-01-02 MED ORDER — ISOSORBIDE MONONITRATE ER 30 MG PO TB24
15.0000 mg | ORAL_TABLET | Freq: Every morning | ORAL | Status: DC
  Administered 2015-01-03 – 2015-01-09 (×7): 15 mg via ORAL
  Filled 2015-01-02 (×7): qty 1

## 2015-01-02 MED ORDER — RESOURCE THICKENUP CLEAR PO POWD
ORAL | Status: DC | PRN
Start: 2015-01-02 — End: 2015-01-10
  Administered 2015-01-02: 18:00:00 via ORAL
  Filled 2015-01-02: qty 125

## 2015-01-02 MED ORDER — ONDANSETRON HCL 4 MG PO TABS: 4.0000 mg | ORAL_TABLET | Freq: Four times a day (QID) | ORAL | Status: DC | PRN

## 2015-01-02 MED ORDER — SODIUM CHLORIDE 0.9 % IJ SOLN
3.0000 mL | Freq: Two times a day (BID) | INTRAMUSCULAR | Status: DC
  Administered 2015-01-02 – 2015-01-09 (×11): 3 mL via INTRAVENOUS

## 2015-01-02 MED ORDER — ONDANSETRON HCL 4 MG/2ML IJ SOLN: 4.0000 mg | Freq: Four times a day (QID) | INTRAMUSCULAR | Status: DC | PRN

## 2015-01-02 MED ORDER — HYDROCORTISONE ACETATE 25 MG RE SUPP
25.0000 mg | Freq: Two times a day (BID) | RECTAL | Status: DC
  Administered 2015-01-02 – 2015-01-09 (×15): 25 mg via RECTAL
  Filled 2015-01-02 (×15): qty 1

## 2015-01-02 MED ORDER — CALCITRIOL 0.25 MCG PO CAPS
0.2500 ug | ORAL_CAPSULE | Freq: Every day | ORAL | Status: DC
  Administered 2015-01-03 – 2015-01-09 (×7): 0.25 ug via ORAL
  Filled 2015-01-02 (×8): qty 1

## 2015-01-02 MED ORDER — ACETAMINOPHEN 650 MG RE SUPP
650.0000 mg | Freq: Four times a day (QID) | RECTAL | Status: DC | PRN
Start: 2015-01-02 — End: 2015-01-10

## 2015-01-02 MED ORDER — MUPIROCIN 2 % EX OINT
1.0000 "application " | TOPICAL_OINTMENT | Freq: Two times a day (BID) | CUTANEOUS | Status: DC
  Administered 2015-01-02 – 2015-01-09 (×15): 1 via NASAL
  Filled 2015-01-02 (×4): qty 22

## 2015-01-02 MED ORDER — DONEPEZIL HCL 5 MG PO TABS
10.0000 mg | ORAL_TABLET | Freq: Every day | ORAL | Status: DC
  Administered 2015-01-02: 10 mg via ORAL
  Filled 2015-01-02: qty 2

## 2015-01-02 MED ORDER — SODIUM CHLORIDE 0.9 % IV SOLN
250.0000 mL | INTRAVENOUS | Status: DC | PRN
Start: 2015-01-02 — End: 2015-01-10

## 2015-01-02 NOTE — ED Provider Notes (Addendum)
CSN: [id number]     Arrival date & time 01/02/15  1101 History  This chart was scribed for Orpah Greek, MD by Edison Simon, ED Scribe. This patient was seen in room APA06/APA06 and the patient's care was started at 11:09 AM.    Level 5 Caveat: Dementia Chief Complaint  Patient presents with  . Shortness of Breath   The history is provided by the patient. The history is limited by the condition of the patient. No language interpreter was used.    HPI Comments: [name] is a 74 y.o. female who presents to the Emergency Department complaining of low oxygen saturation with onset this morning. Per EMS, her home health nurse called them because her O2 sat was 80% while on 2L oxygen. EMS states they arrived and found her in no respiratory distress, and her O2 sat improved to 94-95% on 4L O2. She states she was recently hospitalized for aspiration pneumonia and discharged 2 days ago. She states she does not feel short of breath at this time. She denies pain anywhere   Past Medical History  Diagnosis Date  . Type 2 diabetes mellitus   . Essential hypertension, benign   . History of stroke     Previously on Coumadin  . MI, old     Reported 89  . Seizure disorder   . Coronary atherosclerosis of native coronary artery     Multivessel status post CABG 2002  . History of GI bleed     Erosive gastritis and duodenitis by EGD 2002  . Mixed hyperlipidemia   . Ischemic cardiomyopathy     LVEF 40-45% March 2013  . Dementia   . Stroke   . Seizures   . CKD (chronic kidney disease) stage 4, GFR 15-29 ml/min   . CHF (congestive heart failure)   . Anemia of chronic renal failure, stage 4 (severe) 01/02/2015   Past Surgical History  Procedure Laterality Date  . Coronary artery bypass graft  July 2002    LIMA to LAD, SVG to OM1, SVG to OM 2, SVG to RCA  . Tee without cardioversion  01/28/2012    Procedure: TRANSESOPHAGEAL ECHOCARDIOGRAM (TEE);  Surgeon: Lelon Perla, MD;  Location:  [facility] ENDOSCOPY;  Service: Cardiovascular;  Laterality: [name];  . Colonoscopy  2002  . Esophagogastroduodenoscopy  2002  . Esophagogastroduodenoscopy [name] 01/18/2014    SAY:TKZSW hiatal hernia. Abnormal gastric and duodenal bulbar mucosa of uncertain significance - status post gastric bx (chronic gastritis/H.pylori +  . Colonoscopy [name] 06/25/2014    Dr. Laural Golden: tubular adenoma, pandiverticulosis.   . Agile capsule [name] 07/06/2014    Procedure: AGILE CAPSULE;  Surgeon: Danie Binder, MD;  Location: [facility] ENDO SUITE;  Service: Endoscopy;  Laterality: [name];  730  . Esophagogastroduodenoscopy [name] 07/20/2014    Dr. Gala Romney: tiny gastric polyps not manipulated. minimal pigmentation of duodenal bulb likely not significant  . Givens capsule study [name] 07/20/2014    incomplete   Family History  Problem Relation Age of Onset  . Stroke Father   . Diabetes type II Mother   . Colon cancer Neg Hx    History  Substance Use Topics  . Smoking status: Former Smoker -- 1.00 packs/day for 50 years    Types: Cigarettes  . Smokeless tobacco: Not on file  . Alcohol Use: No   OB History    No data available     Review of Systems  Unable to perform ROS: Dementia  Respiratory: Negative for shortness  of breath.        Low oxygen saturation  Musculoskeletal: Negative for myalgias, back pain, arthralgias and neck pain.      Allergies  Review of patient's allergies indicates no known allergies.  Home Medications   Prior to Admission medications   Medication Sig Start Date End Date Taking? Authorizing Provider  calcitRIOL (ROCALTROL) 0.25 MCG capsule Take 1 capsule (0.25 mcg total) by mouth daily. 05/02/14   Eugenie Filler, MD  donepezil (ARICEPT) 10 MG tablet Take 10 mg by mouth at bedtime.  01/30/14   Historical Provider, MD  folic acid (FOLVITE) 1 MG tablet Take 1 mg by mouth every morning.     Historical Provider, MD  hydrALAZINE (APRESOLINE) 25 MG tablet Take 3 tablets (75 mg total) by mouth every 8 (eight)  hours. 12/31/14   Samuella Cota, MD  hydrocortisone (ANUSOL-HC) 25 MG suppository Place 1 suppository (25 mg total) rectally 2 (two) times daily. 10/25/14   Kathie Dike, MD  insulin detemir (LEVEMIR) 100 UNIT/ML injection Inject 0.08 mLs (8 Units total) into the skin at bedtime. 05/02/14   Eugenie Filler, MD  isosorbide mononitrate (IMDUR) 30 MG 24 hr tablet Take 15 mg by mouth every morning.     Historical Provider, MD  levETIRAcetam (KEPPRA) 100 MG/ML solution Take 5 mLs by mouth 2 (two) times daily. 01/13/14   Historical Provider, MD  linagliptin (TRADJENTA) 5 MG TABS tablet Take 5 mg by mouth every morning.     Historical Provider, MD  metoprolol succinate (TOPROL-XL) 100 MG 24 hr tablet Take 50 mg by mouth every morning. Take with or immediately following a meal.    Historical Provider, MD  mupirocin ointment (BACTROBAN) 2 % Place 1 application into the nose 2 (two) times daily.    Historical Provider, MD  pantoprazole (PROTONIX) 40 MG tablet Take 40 mg by mouth every morning.    Historical Provider, MD  pravastatin (PRAVACHOL) 20 MG tablet Take 20 mg by mouth at bedtime.     Historical Provider, MD  sodium bicarbonate 650 MG tablet Take 1,300 mg by mouth 3 (three) times daily.     Historical Provider, MD  torsemide (DEMADEX) 20 MG tablet Take 2 tablets (40 mg total) by mouth daily. 12/31/14   Samuella Cota, MD   BP 171/83 mmHg  Pulse 70  Temp(Src) 98.3 F (36.8 C) (Oral)  Resp 20  SpO2 100% Physical Exam  Constitutional: She appears well-developed and well-nourished. No distress.  HENT:  Head: Normocephalic and atraumatic.  Right Ear: Hearing normal.  Left Ear: Hearing normal.  Nose: Nose normal.  Mouth/Throat: Oropharynx is clear and moist and mucous membranes are normal.  Eyes: Conjunctivae and EOM are normal. Pupils are equal, round, and reactive to light.  Neck: Normal range of motion. Neck supple.  Cardiovascular: Regular rhythm, S1 normal and S2 normal.  Exam reveals no  gallop and no friction rub.   No murmur heard. Pulmonary/Chest: Effort normal. No respiratory distress. She has rhonchi (diffuse). She exhibits no tenderness.  Decreased breath sounds  Abdominal: Soft. Normal appearance and bowel sounds are normal. There is no hepatosplenomegaly. There is no tenderness. There is no rebound, no guarding, no tenderness at McBurney's point and negative Murphy's sign. No hernia.  Musculoskeletal: Normal range of motion.  Neurological: She is alert. She has normal strength. No cranial nerve deficit or sensory deficit. Coordination normal. GCS eye subscore is 4. GCS verbal subscore is 5. GCS motor subscore is 6.  disoriented  Skin: Skin is warm, dry and intact. No rash noted. No cyanosis.  Psychiatric: She has a normal mood and affect. Her speech is normal and behavior is normal. Thought content normal.  Nursing note and vitals reviewed.   ED Course  Procedures (including critical care time)  DIAGNOSTIC STUDIES: Oxygen Saturation is 95% on nasal canula, adequate by my interpretation.    COORDINATION OF CARE: 11:11 AM Discussed treatment plan with patient at beside, the patient agrees with the plan and has no further questions at this time.   Labs Review Labs Reviewed  CBC WITH DIFFERENTIAL/PLATELET - Abnormal; Notable for the following:    RBC 3.83 (*)    Hemoglobin 9.3 (*)    HCT 31.7 (*)    MCH 24.3 (*)    MCHC 29.3 (*)    RDW 26.8 (*)    All other components within normal limits  BASIC METABOLIC PANEL - Abnormal; Notable for the following:    Sodium 149 (*)    Glucose, Bld 107 (*)    BUN 67 (*)    Creatinine, Ser 3.58 (*)    GFR calc non Af Amer 12 (*)    GFR calc Af Amer 14 (*)    All other components within normal limits  TROPONIN I - Abnormal; Notable for the following:    Troponin I 0.13 (*)    All other components within normal limits  BRAIN NATRIURETIC PEPTIDE - Abnormal; Notable for the following:    B Natriuretic Peptide >4500.0 (*)     All other components within normal limits    Imaging Review Dg Chest Portable 1 View  01/02/2015   CLINICAL DATA:  74 year old female with shortness of Breath. Initial encounter.  EXAM: PORTABLE CHEST - 1 VIEW  COMPARISON:  12/28/2014 and earlier.  FINDINGS: Portable AP upright view at 1117 hours. Sequelae of CABG. Stable cardiomegaly and mediastinal contours. Interval improved lung volumes and mildly decreased vascular congestion. No overt edema. No pneumothorax, pleural effusion or consolidation.  IMPRESSION: Cardiomegaly with interval improved lung volumes and mildly decreased vascular congestion, no overt edema.   Electronically Signed   By: Genevie Ann M.D.   On: 01/02/2015 11:57     EKG Interpretation   Date/Time:  Tuesday January 02 2015 11:11:18 EST Ventricular Rate:  78 PR Interval:  159 QRS Duration: 76 QT Interval:  492 QTC Calculation: 560 R Axis:   55 Text Interpretation:  Sinus rhythm Atrial premature complexes Low voltage,  extremity leads Nonspecific T abnormalities, lateral leads Prolonged QT  interval No significant change since last tracing Confirmed by [place] [phone]) on 01/02/2015 11:23:26 AM      MDM   Final diagnoses:  Shortness of breath   CHF  Patient sent to the ER by home health nurse. Patient was recently hospitalized for acute kidney injury and seizure. Patient had a presumed aspiration pneumonia during hospitalization. Patient home 2 days ago. She is on oxygen 2 L by nasal cannula at home. Home health nurse visited today and found her with increased work of breathing and oxygen saturation of 80%. EMS report improvement with increased oxygen to 4 L.   Patient exhibiting her baseline dementia at arrival, is without complaints. Workup here in the ER is concerning for congestive heart failure and volume overload as a cause of the patient's hypoxia. Patient will require repeat hospitalization for further management.  Discussed with Dr.  Caryn Section. Based on patient's chest x-ray not being significantly worse than previous and  positive troponin, asks that the patient get a VQ scan. Will admit to hospital to telemetry. Discussed with office of Dr. Mahala Menghini - will consult.  I personally performed the services described in this documentation, which was scribed in my presence. The recorded information has been reviewed and is accurate.    Orpah Greek, MD 01/02/15 [place], MD 01/02/15 1351

## 2015-01-02 NOTE — ED Notes (Signed)
EMS reports pt was admitted recently for aspiration pneumonia and was d/c'd Sunday.  Reports pt is on home 02 at [facility] and Home health nurse reports 02sat on 2liters was 80%.  EMS reports pt denies feeling sob.  EMS put pt on 4 liters and 02 sat was 95%.  EMS reports pt on home 02 prn.

## 2015-01-02 NOTE — ED Notes (Signed)
Pt is going to Pulmonary Perf and Vent. Admitting physician requests she go to this scan before going upstairs.

## 2015-01-02 NOTE — ED Notes (Signed)
Spoke with Sec on 3rd floor and was told that pt's room was changing to a room where she could be seen better.  300 to call when room ready.

## 2015-01-02 NOTE — Progress Notes (Signed)
-  Hospitalized-  [name],[name]

## 2015-01-02 NOTE — Progress Notes (Signed)
This encounter was created in error - please disregard.

## 2015-01-02 NOTE — ED Notes (Signed)
Lab at bedside

## 2015-01-02 NOTE — ED Notes (Addendum)
NM nurse made aware and states medication for procedure has just come up and will be getting the patient shortly.

## 2015-01-02 NOTE — ED Notes (Signed)
Pt back from VQ scan and admitting doctor is at bedside.

## 2015-01-02 NOTE — Consult Note (Addendum)
[name] MRN: [id number] DOB/AGE: 74-16-1942 74 y.o. Primary Care Physician:HILL,GERALD K, MD Admit date: 01/02/2015 Chief Complaint:  Chief Complaint  Patient presents with  . Shortness of Breath   HPI: Pt is  74 year old female with past medical hx of DM type 2 , Ch resp failure who was brought to ER with c/o respiratory distress.  HPI dates back this morning, when pt upon evaluation by home  health nurse was found to be hypoxic.  EMS was called and her O2 was increased from 2 Liters to 4 liters and pt Pulse ox improved from 80's to 93. Pt herself does not offer any complaints. NO c/o chest pain  No c/o fever/cough/chills  NO c/o diarrhea NO c/o nausea/vomiting NO c/o seizures   Past Medical History  Diagnosis Date  . Type 2 diabetes mellitus   . Essential hypertension, benign   . History of stroke     Previously on Coumadin  . MI, old     Reported 75  . Seizure disorder   . Coronary atherosclerosis of native coronary artery     Multivessel status post CABG 2002  . History of GI bleed     Erosive gastritis and duodenitis by EGD 2002  . Mixed hyperlipidemia   . Ischemic cardiomyopathy     LVEF 40-45% March 2013  . Dementia   . Stroke   . Seizures   . CKD (chronic kidney disease) stage 4, GFR 15-29 ml/min   . CHF (congestive heart failure)   . Anemia of chronic renal failure, stage 4 (severe) 01/02/2015        Family History  Problem Relation Age of Onset  . Stroke Father   . Diabetes type II Mother   . Colon cancer Neg Hx     Social History:  reports that she has quit smoking. Her smoking use included Cigarettes. She has a 50 pack-year smoking history. She does not have any smokeless tobacco history on file. She reports that she does not drink alcohol or use illicit drugs.   Allergies: No Known Allergies   (Not in a hospital admission)     MEQ:[facility] from the symptoms mentioned above,there are no other symptoms referable to all systems  reviewed.     Physical Exam: Vital signs in last 24 hours: Temp:  [98.3 F (36.8 C)] 98.3 F (36.8 C) (02/09 1107) Pulse Rate:  [53-80] 70 (02/09 1302) Resp:  [17-21] 20 (02/09 1302) BP: (146-171)/(54-85) 171/83 mmHg (02/09 1302) SpO2:  [100 %] 100 % (02/09 1302) Weight change:     Intake/Output from previous day:       Physical Exam: General- pt is  Non communicative Resp- No acute REsp distress, NO Rhonchi CVS- S1S2 regular in rate and rhythm GIT- BS+, soft, NT, ND EXT- NO LE Edema, Cyanosis CNS- CN 2-12 grossly intact. Moving all 4 extremities Psych- normal mod and affect    Lab Results: CBC  Recent Labs  01/02/15 1125  WBC 5.2  HGB 9.3*  HCT 31.7*  PLT 166    BMET  Recent Labs  12/31/14 0635 01/02/15 1125  NA 139 149*  K 3.9 4.3  CL 110 112  CO2 27 30  GLUCOSE 205* 107*  BUN 67* 67*  CREATININE 3.74* 3.58*  CALCIUM 8.1* 8.8   Creat trend  2016  3.58           4.29=>3.64=>4.10=>3.95 ( last admission) 2015 2.0--3.75( Multiple admission) 2014 1.4--2.5 20131.5--1.7 20121.26   Sodium Trend  139=>149   MICRO No results found for this or any previous visit (from the past 240 hour(s)).    Lab Results  Component Value Date   PTH 268.4* 04/28/2014   CALCIUM 8.8 01/02/2015   PHOS 4.3 12/25/2014    CXR Cardiomegaly with interval improved lung volumes and mildly decreased vascular congestion, no overt edema.  Impression: 1)Renal CKD stage 4 .                Creat at baseline  CKD since 2013  CKD secondary to DM/HTN/Age associated decline  Progression of CKD marked with multiple AKI in 2015 and now in 2016  Proteinuria Present in U/a  2)HTN  Medication- On Diuretics. On Beta blockers On Vasodilators  3)Anemia HGb at goal (9--11) On ESA  4)CKD Mineral-Bone Disorder PTH elevated. Secondary Hyperparathyroidism present. Phosphorus at  goal.    5)Electrolytes Normokalemic Hypernatremic    Free water deficit  6)Acid base Co2 at goal  7) CAD- trops little higher  Plan:  Will increase Free water Will continue current dosage of diuretics at home. Pt does not seem to be in fluid overload as CXR clear , Not much dependent edema. Will suggest to cycle trops. Will follow bmet     Waikele S 01/02/2015, 3:48 PM

## 2015-01-02 NOTE — H&P (Signed)
[facility] History and Physical  EMI LYMON ZOX:[id number] DOB: May 09, 1941 DOA: 01/02/2015  Referring physician:  PCP: [name], MD   Chief Complaint: decreased oxygen saturation level and resp distress  HPI: [name] is a 74 y.o. female with a past medical history that includes chronic kidney disease stage IV, hypernatremia, anemia, diabetes, chronic respiratory failure requiring oxygen at home, cardio myopathy, presents to the emergency department from home with the chief complaint of respiratory distress and decreased oxygen saturation levels. Initial evaluation in the emergency department reveals acute on chronic respiratory failure with hypoxia, hypernatremia.  Information obtained from daughter at bedside. Reportedly home health nurse evaluated patient this morning and found patient to be hypoxic with oxygen saturation level in the 80s on 2 L and with increased work of breathing. EMS was called and reportedly oxygen saturation levels went to 93% on 4 L. Patient discharged 2 days ago after hospitalized with presumed aspiration pneumonia and acute on chronic kidney disease. Daughter at bedside reports patient was doing fine" until home health nurse came this morning and said she was short of breath". Reports of recent fever chills nausea vomiting diarrhea. Reportedly patient at baseline dementia. She recognizes family members can make her wants and needs known but is incontinent of urine and bowel on occasion. She needs assistance with all ADLs and ambulation. Complaints of any chest pain cough headache dizziness syncope or near-syncope. Workup in the emergency department reveals hemoglobin 9.3 sodium 149 BUN 67 creatinine 3.58 troponin 0.13 serum glucose 107. Chest x-ray reveals cardiomegaly with interval improved lung volumes and mildly decreased vascular congestion no overt edema. BNP greater than 4500. In the emergency department she is hemodynamically stable with a blood  pressure on the high end of normal she is afebrile oxygen saturation levels are 100% on 2 L nasal cannula.   Review of Systems:  Unable to assess from patient given dementia information obtained from caregiver at the bedside. 10 point review of systems completed and all systems are negative except as indicated in the history of present illness.   Past Medical History  Diagnosis Date  . Type 2 diabetes mellitus   . Essential hypertension, benign   . History of stroke     Previously on Coumadin  . MI, old     Reported 33  . Seizure disorder   . Coronary atherosclerosis of native coronary artery     Multivessel status post CABG 2002  . History of GI bleed     Erosive gastritis and duodenitis by EGD 2002  . Mixed hyperlipidemia   . Ischemic cardiomyopathy     LVEF 40-45% March 2013  . Dementia   . Stroke   . Seizures   . CKD (chronic kidney disease) stage 4, GFR 15-29 ml/min   . CHF (congestive heart failure)   . Anemia of chronic renal failure, stage 4 (severe) 01/02/2015   Past Surgical History  Procedure Laterality Date  . Coronary artery bypass graft  July 2002    LIMA to LAD, SVG to OM1, SVG to OM 2, SVG to RCA  . Tee without cardioversion  01/28/2012    Procedure: TRANSESOPHAGEAL ECHOCARDIOGRAM (TEE);  Surgeon: Lelon Perla, MD;  Location: [facility] ENDOSCOPY;  Service: Cardiovascular;  Laterality: [name];  . Colonoscopy  2002  . Esophagogastroduodenoscopy  2002  . Esophagogastroduodenoscopy [name] 01/18/2014    WJX:BJYNW hiatal hernia. Abnormal gastric and duodenal bulbar mucosa of uncertain significance - status post gastric bx (chronic gastritis/H.pylori +  .  Colonoscopy [name] 06/25/2014    Dr. Laural Golden: tubular adenoma, pandiverticulosis.   . Agile capsule [name] 07/06/2014    Procedure: AGILE CAPSULE;  Surgeon: Danie Binder, MD;  Location: [facility] ENDO SUITE;  Service: Endoscopy;  Laterality: [name];  730  . Esophagogastroduodenoscopy [name] 07/20/2014    Dr. Gala Romney: tiny gastric polyps not  manipulated. minimal pigmentation of duodenal bulb likely not significant  . Givens capsule study [name] 07/20/2014    incomplete   Social History:  reports that she has quit smoking. Her smoking use included Cigarettes. She has a 50 pack-year smoking history. She does not have any smokeless tobacco history on file. She reports that she does not drink alcohol or use illicit drugs. Patient lives at home with her daughter and granddaughter she is full assist with ADLs. Able to make her wants and needs known. Incontinent. No Known Allergies  Family History  Problem Relation Age of Onset  . Stroke Father   . Diabetes type II Mother   . Colon cancer Neg Hx     Prior to Admission medications   Medication Sig Start Date End Date Taking? Authorizing Provider  calcitRIOL (ROCALTROL) 0.25 MCG capsule Take 1 capsule (0.25 mcg total) by mouth daily. 05/02/14   Eugenie Filler, MD  donepezil (ARICEPT) 10 MG tablet Take 10 mg by mouth at bedtime.  01/30/14   Historical Provider, MD  folic acid (FOLVITE) 1 MG tablet Take 1 mg by mouth every morning.     Historical Provider, MD  hydrALAZINE (APRESOLINE) 25 MG tablet Take 3 tablets (75 mg total) by mouth every 8 (eight) hours. 12/31/14   Samuella Cota, MD  hydrocortisone (ANUSOL-HC) 25 MG suppository Place 1 suppository (25 mg total) rectally 2 (two) times daily. 10/25/14   Kathie Dike, MD  insulin detemir (LEVEMIR) 100 UNIT/ML injection Inject 0.08 mLs (8 Units total) into the skin at bedtime. 05/02/14   Eugenie Filler, MD  isosorbide mononitrate (IMDUR) 30 MG 24 hr tablet Take 15 mg by mouth every morning.     Historical Provider, MD  levETIRAcetam (KEPPRA) 100 MG/ML solution Take 5 mLs by mouth 2 (two) times daily. 01/13/14   Historical Provider, MD  linagliptin (TRADJENTA) 5 MG TABS tablet Take 5 mg by mouth every morning.     Historical Provider, MD  metoprolol succinate (TOPROL-XL) 100 MG 24 hr tablet Take 50 mg by mouth every morning. Take with or  immediately following a meal.    Historical Provider, MD  mupirocin ointment (BACTROBAN) 2 % Place 1 application into the nose 2 (two) times daily.    Historical Provider, MD  pantoprazole (PROTONIX) 40 MG tablet Take 40 mg by mouth every morning.    Historical Provider, MD  pravastatin (PRAVACHOL) 20 MG tablet Take 20 mg by mouth at bedtime.     Historical Provider, MD  sodium bicarbonate 650 MG tablet Take 1,300 mg by mouth 3 (three) times daily.     Historical Provider, MD  torsemide (DEMADEX) 20 MG tablet Take 2 tablets (40 mg total) by mouth daily. 12/31/14   Samuella Cota, MD   Physical Exam: Filed Vitals:   01/02/15 1107 01/02/15 1130 01/02/15 1302  BP: 161/54 160/79 171/83  Pulse: 53 74 70  Temp: 98.3 F (36.8 C)    TempSrc: Oral    Resp: 18 18 20   SpO2:  100% 100%    Wt Readings from Last 3 Encounters:  12/31/14 77 kg (169 lb 12.1 oz)  10/25/14 78.835 kg (  173 lb 12.8 oz)  10/16/14 69.945 kg (154 lb 3.2 oz)    General:  Well-nourished somewhat lethargic  Eyes: PERRL, normal lids, irises & conjunctiva ENT: grossly normal hearing, because membranes of her mouth are moist and pink Neck: no LAD, masses or thyromegaly Cardiovascular: RRR, no m/r/g. No LE edema.  Respiratory: Mild increased work of breathing at rest mild use of abdominal accessory muscles breath sounds coarse with scattered rhonchi no wheezes audible congestion upper airways anteriorly Abdomen: soft, ntnd positive bowel sounds throughout no guarding Skin: no rash or induration seen on limited exam Musculoskeletal: grossly normal tone BUE/BLE Psychiatric: grossly normal mood and affect, speech fluent and appropriate Neurologic: Lethargic attempts to follow commands will nod to simple questions           Labs on Admission:  Basic Metabolic Panel:  Recent Labs Lab 12/28/14 0636 12/29/14 0619 12/30/14 0615 12/30/14 0656 12/31/14 0635 01/02/15 1125  NA 136 139 134*  --  139 149*  K 3.3* 3.9 3.8  --   3.9 4.3  CL 101 103 104  --  110 112  CO2 26 27 23   --  27 30  GLUCOSE 200* 132* 159*  --  205* 107*  BUN 60* 60* 63*  --  67* 67*  CREATININE 3.68* 3.62* 3.62* 3.85* 3.74* 3.58*  CALCIUM 7.5* 7.9* 7.8*  --  8.1* 8.8   Liver Function Tests: No results for input(s): AST, ALT, ALKPHOS, BILITOT, PROT, ALBUMIN in the last 168 hours. No results for input(s): LIPASE, AMYLASE in the last 168 hours. No results for input(s): AMMONIA in the last 168 hours. CBC:  Recent Labs Lab 12/27/14 0628 01/02/15 1125  WBC 4.5 5.2  NEUTROABS  --  3.3  HGB 9.9* 9.3*  HCT 34.4* 31.7*  MCV 82.3 82.8  PLT 150 166   Cardiac Enzymes:  Recent Labs Lab 01/02/15 1125  TROPONINI 0.13*    BNP (last 3 results)  Recent Labs  01/02/15 1125  BNP >4500.0*    ProBNP (last 3 results)  Recent Labs  03/15/14 1645 04/27/14 1536 06/09/14 1021  PROBNP 37134.0* 51631.0* 30358.0*    CBG:  Recent Labs Lab 12/30/14 1222 12/30/14 1631 12/30/14 2115 12/31/14 0731 12/31/14 1153  GLUCAP 192* 213* 248* 189* 162*    Radiological Exams on Admission: Dg Chest Portable 1 View  01/02/2015   CLINICAL DATA:  74 year old female with shortness of Breath. Initial encounter.  EXAM: PORTABLE CHEST - 1 VIEW  COMPARISON:  12/28/2014 and earlier.  FINDINGS: Portable AP upright view at 1117 hours. Sequelae of CABG. Stable cardiomegaly and mediastinal contours. Interval improved lung volumes and mildly decreased vascular congestion. No overt edema. No pneumothorax, pleural effusion or consolidation.  IMPRESSION: Cardiomegaly with interval improved lung volumes and mildly decreased vascular congestion, no overt edema.   Electronically Signed   By: Genevie Ann M.D.   On: 01/02/2015 11:57    EKG: Independently reviewed sinus rhythm with no significant change since last tracing.  Assessment/Plan Principal Problem:   Hypoxia: Acute on chronic. Patient did require more oxygen to maintain sats. However the time of my exam  saturation levels 99% on 2 L. Will admit to telemetry. Will obtain VQ scan to assess for PE. Chest x-ray shows improvement in lung volumes and decreased vascular congestion. BNP greater than 4500. Initial troponin elevated. She was recently treated for presumed aspiration pneumonia also has a history chronic combined heart failure and diuretics were adjusted on recent discharge. Will continue home Demadex.  Will provide lasix IV now and daily as well. Continue oxygen supplementation and nebulizers. Will obtain ABG for further assessment.  Active Problems: Hypernatremia: Mild. History of same. Recent urine sodium within the limits of normal. Related to free water. Chart review indicates 10 days ago sodium level with 160. Will start gentle D5W. Lasix as noted above.  Await nephrology input.    Elevated troponin: No report of chest pain. Could be related to demand. We'll cycle troponins. Will repeat EKG in the morning. VQ scan results pending. Vital signs are stable. Is on telemetry    CKD (chronic kidney disease) stage 4, GFR 15-29 ml/min: Creatinine appears to be at baseline. Nephrology has been consulted.     Diabetes: We'll continue Levemir at half her home dose. 11 A1c 7.52 weeks ago. Will continue car modified diet. Will monitor    Iron deficiency anemia: As well as anemia of chronic disease. Current hemoglobin 9.3 which appears to be close to her baseline. Review indicates she is followed by hematology and gets Aranesp every 2 weeks. In addition she gets IV iron occasionally. Family member indicates she has an appointment with hematology tomorrow.    Dementia: Somewhat lethargic compared to baseline. May be related to #1. Will obtain an ABG. Any sedating medications. Monitor    Cardiomyopathy, ischemic/ combined systolic and diastolic heart failure. Last echo in July 2015 showed an EF of 45% grade 2 diastolic dysfunction. Last admission Lasix was discontinued secondary to worsening kidney disease  and Demadex dose was reduced. This may lead to fluid buildup. However chest x-ray shows less. Consider increasing diuresis. Obtain daily weights. Monitor intake and output   nephrology       Code Status: full (confirmed) DVT Prophylaxis: Family Communication: daughter at bedside and daughter [name] on [phone] Disposition Plan:home when ready may benefit placement  Time spent: 65 minutes  [facility] Pager [phone]

## 2015-01-02 NOTE — ED Notes (Signed)
Report has been given and pt will go upstairs when she gets back from VQ scan.

## 2015-01-02 NOTE — ED Notes (Signed)
Pt had wet her diaper at rounding, pt was changed into a dry diaper.

## 2015-01-03 ENCOUNTER — Ambulatory Visit (HOSPITAL_COMMUNITY): Payer: Medicare Other

## 2015-01-03 ENCOUNTER — Other Ambulatory Visit (HOSPITAL_COMMUNITY): Payer: Medicare Other

## 2015-01-03 ENCOUNTER — Ambulatory Visit (HOSPITAL_COMMUNITY): Payer: Medicare Other | Admitting: Oncology

## 2015-01-03 ENCOUNTER — Encounter (HOSPITAL_COMMUNITY): Payer: Self-pay

## 2015-01-03 ENCOUNTER — Encounter (HOSPITAL_COMMUNITY): Payer: Medicare Other

## 2015-01-03 DIAGNOSIS — F0390 Unspecified dementia without behavioral disturbance: Secondary | ICD-10-CM

## 2015-01-03 DIAGNOSIS — D509 Iron deficiency anemia, unspecified: Secondary | ICD-10-CM

## 2015-01-03 LAB — TROPONIN I: Troponin I: 0.1 ng/mL — ABNORMAL HIGH (ref <0.031)

## 2015-01-03 LAB — GLUCOSE, CAPILLARY
GLUCOSE-CAPILLARY: 193 mg/dL — AB (ref 70–99)
GLUCOSE-CAPILLARY: 211 mg/dL — AB (ref 70–99)
GLUCOSE-CAPILLARY: 157 mg/dL — AB (ref 70–99)
GLUCOSE-CAPILLARY: 147 mg/dL — AB (ref 70–99)
Glucose-Capillary: 173 mg/dL — ABNORMAL HIGH (ref 70–99)
Glucose-Capillary: 222 mg/dL — ABNORMAL HIGH (ref 70–99)
Glucose-Capillary: 221 mg/dL — ABNORMAL HIGH (ref 70–99)
Glucose-Capillary: 213 mg/dL — ABNORMAL HIGH (ref 70–99)
Glucose-Capillary: 212 mg/dL — ABNORMAL HIGH (ref 70–99)
Glucose-Capillary: 222 mg/dL — ABNORMAL HIGH (ref 70–99)

## 2015-01-03 LAB — BASIC METABOLIC PANEL
Anion gap: 11 (ref 5–15)
BUN: 67 mg/dL — ABNORMAL HIGH (ref 6–23)
CO2: 30 mmol/L (ref 19–32)
Calcium: 8.9 mg/dL (ref 8.4–10.5)
Chloride: 106 mmol/L (ref 96–112)
Creatinine, Ser: 3.43 mg/dL — ABNORMAL HIGH (ref 0.50–1.10)
GFR calc non Af Amer: 12 mL/min — ABNORMAL LOW (ref >90)
GFR, EST AFRICAN AMERICAN: 14 mL/min — AB (ref >90)
GLUCOSE: 205 mg/dL — AB (ref 70–99)
Potassium: 4.5 mmol/L (ref 3.5–5.1)
Sodium: 147 mmol/L — ABNORMAL HIGH (ref 135–145)

## 2015-01-03 LAB — URINALYSIS, ROUTINE W REFLEX MICROSCOPIC
BILIRUBIN URINE: NEGATIVE
GLUCOSE, UA: NEGATIVE mg/dL
Ketones, ur: NEGATIVE mg/dL
Nitrite: NEGATIVE
PROTEIN: 100 mg/dL — AB
Specific Gravity, Urine: 1.015 (ref 1.005–1.030)
UROBILINOGEN UA: 0.2 mg/dL (ref 0.0–1.0)
pH: 6.5 (ref 5.0–8.0)

## 2015-01-03 LAB — BLOOD GAS, ARTERIAL
ACID-BASE EXCESS: 5.2 mmol/L — AB (ref 0.0–2.0)
BICARBONATE: 29.5 meq/L — AB (ref 20.0–24.0)
Drawn by: 25788
O2 Content: 2 L/min
O2 Saturation: 96.2 %
PO2 ART: 85.1 mmHg (ref 80.0–100.0)
Patient temperature: 37
TCO2: 27.8 mmol/L (ref 0–100)
pCO2 arterial: 45.4 mmHg — ABNORMAL HIGH (ref 35.0–45.0)
pH, Arterial: 7.428 (ref 7.350–7.450)

## 2015-01-03 LAB — URINE MICROSCOPIC-ADD ON

## 2015-01-03 LAB — CBC
HCT: 34.3 % — ABNORMAL LOW (ref 36.0–46.0)
HEMOGLOBIN: 9.9 g/dL — AB (ref 12.0–15.0)
MCH: 24.4 pg — ABNORMAL LOW (ref 26.0–34.0)
MCHC: 28.9 g/dL — AB (ref 30.0–36.0)
MCV: 84.7 fL (ref 78.0–100.0)
Platelets: 190 10*3/uL (ref 150–400)
RBC: 4.05 MIL/uL (ref 3.87–5.11)
RDW: 26.6 % — ABNORMAL HIGH (ref 11.5–15.5)
WBC: 10.1 10*3/uL (ref 4.0–10.5)

## 2015-01-03 LAB — RAPID URINE DRUG SCREEN, HOSP PERFORMED
AMPHETAMINES: NOT DETECTED
BARBITURATES: NOT DETECTED
Benzodiazepines: NOT DETECTED
COCAINE: NOT DETECTED
OPIATES: NOT DETECTED
TETRAHYDROCANNABINOL: NOT DETECTED

## 2015-01-03 LAB — HEMOGLOBIN A1C
Hgb A1c MFr Bld: 7.8 % — ABNORMAL HIGH (ref 4.8–5.6)
MEAN PLASMA GLUCOSE: 177 mg/dL

## 2015-01-03 MED ORDER — INSULIN ASPART 100 UNIT/ML ~~LOC~~ SOLN
0.0000 [IU] | Freq: Three times a day (TID) | SUBCUTANEOUS | Status: DC
  Administered 2015-01-03 (×2): 5 [IU] via SUBCUTANEOUS
  Administered 2015-01-04 (×2): 2 [IU] via SUBCUTANEOUS
  Administered 2015-01-04: 3 [IU] via SUBCUTANEOUS
  Administered 2015-01-05: 5 [IU] via SUBCUTANEOUS
  Administered 2015-01-05: 3 [IU] via SUBCUTANEOUS
  Administered 2015-01-05 – 2015-01-06 (×3): 2 [IU] via SUBCUTANEOUS
  Administered 2015-01-06 – 2015-01-07 (×3): 3 [IU] via SUBCUTANEOUS
  Administered 2015-01-08 (×3): 2 [IU] via SUBCUTANEOUS
  Administered 2015-01-09 (×2): 3 [IU] via SUBCUTANEOUS
  Administered 2015-01-09: 2 [IU] via SUBCUTANEOUS

## 2015-01-03 MED ORDER — ENOXAPARIN SODIUM 30 MG/0.3ML ~~LOC~~ SOLN
30.0000 mg | SUBCUTANEOUS | Status: DC
  Administered 2015-01-03 – 2015-01-08 (×6): 30 mg via SUBCUTANEOUS
  Filled 2015-01-03 (×6): qty 0.3

## 2015-01-03 MED ORDER — FUROSEMIDE 10 MG/ML IJ SOLN
20.0000 mg | Freq: Every day | INTRAMUSCULAR | Status: DC
  Administered 2015-01-03 – 2015-01-04 (×2): 20 mg via INTRAVENOUS
  Filled 2015-01-03 (×2): qty 2

## 2015-01-03 NOTE — Care Management Note (Addendum)
    Page 1 of 2   01/09/2015     2:04:05 PM CARE MANAGEMENT NOTE 01/09/2015  Patient:  [name], [name]   Account Number:  [id number]  Date Initiated:  01/03/2015  Documentation initiated by:  Jolene Provost  Subjective/Objective Assessment:   Pt is from home, lives with [name]. Pt discharged previsouly with [facility] RN, PT, SLP and home O2. Pt wishes to return home at discharge with resumption of [facility] services. Pt gave permission to speak to daughter.     Action/Plan:   [place], also wishes for pt to return home with [facility] services. [facility] aware of discharge plan and pt will be made [place] at discharge. Pt may discharge home today per MD.   Anticipated DC Date:  01/10/2015   Anticipated DC Plan:  [place]  CM consult      [facility] Choice  Resumption Of Svcs/PTA Jodell Weitman   Choice offered to / List presented to:  [name]   DME arranged  [place]      DME agency  [place] arranged  HH-1 RN  [place].   Status of service:  Completed, signed off Medicare Important Message given?  YES (If response is "NO", the following Medicare IM given date fields will be blank) Date Medicare IM given:  01/05/2015 Medicare IM given by:  Jolene Provost Date Additional Medicare IM given:  01/09/2015 Additional Medicare IM given by:  JESSICA CHILDRESS  Discharge Disposition:  [place]  Per UR Regulation:  Reviewed for med. necessity/level of care/duration of stay  If discussed at [place] of Stay Meetings, dates discussed:   01/09/2015    Comments:  01/09/2015 [place], RN, MSN, CM Pt discharging home today. Pt will have resumption of [place] services and will become HRI through [facility] at discharge. Pt will also be followed by [facility].  No further CM needs identified.  01/05/2015 [place], RN, MSN, CM Pt plans to  discharge home with resumption of HH serivces over weekend. No further CM needs identified. Instructions left for weekend RN on how to contact [facility] and notify them of discharge from hospital. 01/03/2015 [place], RN, MSN, CM Wheelchair ordered. Terrence Dupont, of [facility] aware order and will obtain pt information from chart. Wheelchair will be delivered to pt's home. No further CM needs identified.

## 2015-01-03 NOTE — Care Management Utilization Note (Signed)
UR completed 

## 2015-01-03 NOTE — Progress Notes (Signed)
[facility] PROGRESS NOTE  [name] GUY:[id number] DOB: 05/13/1941 DOA: 01/02/2015 PCP: [name], MD  Assessment/Plan: Hypoxia: Acute on chronic. Back to baseline this am. VQ scan with low risk. S/p lasix IV in ED. Urine output 1.3L. Hx aspiration/dysphagia and on dysphagia diet.   Active Problems: Hypernatremia: Improving with IV fluids. Appreciate nephrology assistance. Defer IV fluids and diuresis. Chart review indicates 10 days ago sodium level with 160.    Elevated troponin: No report of chest pain. Trending downward.  Could be related to demand.  Will repeat EKG with Afib with PVC. Vital signs are stable.    CKD (chronic kidney disease) stage 4, GFR 15-29 ml/min: Creatinine remains close to  baseline. Nephrology following.    Diabetes: CBG range 162-248. Will increase SSI to moderate. If continues with good po intake consider meal coverage.  We'll continue Levemir at half her home dose. 11 A1c 7.52 weeks ago. Will continue car modified diet. Will monitor   Iron deficiency anemia: As well as anemia of chronic disease. Current hemoglobin 9.3 which appears to be close to her baseline. Review indicates she is followed by hematology and gets Aranesp every 2 weeks. In addition she gets IV iron occasionally. Family member indicates she has an appointment with hematology today.   Dementia: Somewhat lethargic compared to baseline. May be related to #1. Will obtain an ABG. Any sedating medications. Monitor   Cardiomyopathy, ischemic/ combined systolic and diastolic heart failure. Last echo in July 2015 showed an EF of 56% grade 2 diastolic dysfunction. Last admission Lasix was discontinued secondary to worsening kidney disease and Demadex dose was reduced. Chest x-ray showed no fluid overload. Weight up somewhat. IV fluids and diuresis per nephrology  Dysphagia: evaluated last hospitalization. On recommended diet   Code Status: full Family Communication: none  present Disposition Plan: home when ready   Consultants:  nephrology  Procedures:  none  Antibiotics:  none  HPI/Subjective: Arouses to verbal stimuli. Denies pain/discomfort  Objective: Filed Vitals:   01/03/15 0656  BP: 144/69  Pulse: 88  Temp:   Resp:     Intake/Output Summary (Last 24 hours) at 01/03/15 0951 Last data filed at 01/03/15 0600  Gross per 24 hour  Intake 1105.17 ml  Output   1350 ml  Net -244.83 ml   Filed Weights   01/02/15 1642 01/03/15 0528 01/03/15 0651  Weight: 72.5 kg (159 lb 13.3 oz) 74.7 kg (164 lb 10.9 oz) 73.7 kg (162 lb 7.7 oz)    Exam:   General:  Obese appears comfortable slightly lethargic  Cardiovascular: RRR No MGR trace-1+ LE edema to thighs  Respiratory: normal effort somewhat shallow BS distant but clear  Abdomen: obese soft +BS non-tender  Musculoskeletal: no clubbing or cyanosis   Data Reviewed: Basic Metabolic Panel:  Recent Labs Lab 12/29/14 0619 12/30/14 0615 12/30/14 0656 12/31/14 0635 01/02/15 1125 01/03/15 0555  NA 139 134*  --  139 149* 147*  K 3.9 3.8  --  3.9 4.3 4.5  CL 103 104  --  110 112 106  CO2 27 23  --  27 30 30   GLUCOSE 132* 159*  --  205* 107* 205*  BUN 60* 63*  --  67* 67* 67*  CREATININE 3.62* 3.62* 3.85* 3.74* 3.58* 3.43*  CALCIUM 7.9* 7.8*  --  8.1* 8.8 8.9   Liver Function Tests: No results for input(s): AST, ALT, ALKPHOS, BILITOT, PROT, ALBUMIN in the last 168 hours. No results for input(s): LIPASE, AMYLASE in the  last 168 hours. No results for input(s): AMMONIA in the last 168 hours. CBC:  Recent Labs Lab 01/02/15 1125 01/03/15 0555  WBC 5.2 10.1  NEUTROABS 3.3  --   HGB 9.3* 9.9*  HCT 31.7* 34.3*  MCV 82.8 84.7  PLT 166 190   Cardiac Enzymes:  Recent Labs Lab 01/02/15 1125 01/02/15 1707 01/02/15 2343  TROPONINI 0.13* 0.12* 0.10*   BNP (last 3 results)  Recent Labs  01/02/15 1125  BNP >4500.0*    ProBNP (last 3 results)  Recent Labs   03/15/14 1645 04/27/14 1536 06/09/14 1021  PROBNP 37134.0* 51631.0* 30358.0*    CBG:  Recent Labs Lab 12/31/14 0731 12/31/14 1153 01/02/15 1721 01/02/15 2141 01/03/15 0736  GLUCAP 189* 162* 68* 232* 173*    No results found for this or any previous visit (from the past 240 hour(s)).   Studies: Nm Pulmonary Perf And Vent  01/02/2015   CLINICAL DATA:  Chest pain and shortness of breath for several hours  EXAM: NUCLEAR MEDICINE VENTILATION - PERFUSION LUNG SCAN  Views: Anterior, posterior, left lateral, right lateral, RPO, LPO, RAO, LAO -ventilation and perfusion  Radionuclide: Technetium 68m DTPA -ventilation; Technetium 79m macroaggregated albumin-perfusion  Dose:  40.0 mCi-ventilation; 6.2 mCi-perfusion  COMPARISON:  Chest radiograph January 02, 2015  FINDINGS: Ventilation: There are no focal ventilation defects appreciable. Heart is noted to be enlarged. Prominence in the right paratracheal region consistent with great vessel prominence also results in photopenia in this portion of the ventilation study.  Perfusion: Cardiomegaly is noted. There are no appreciable perfusion defects. There is no ventilation/perfusion mismatch.  IMPRESSION: No appreciable ventilation or perfusion defects. This study constitutes a very low probability of pulmonary embolus.   Electronically Signed   By: Lowella Grip III M.D.   On: 01/02/2015 16:30   Dg Chest Portable 1 View  01/02/2015   CLINICAL DATA:  74 year old female with shortness of Breath. Initial encounter.  EXAM: PORTABLE CHEST - 1 VIEW  COMPARISON:  12/28/2014 and earlier.  FINDINGS: Portable AP upright view at 1117 hours. Sequelae of CABG. Stable cardiomegaly and mediastinal contours. Interval improved lung volumes and mildly decreased vascular congestion. No overt edema. No pneumothorax, pleural effusion or consolidation.  IMPRESSION: Cardiomegaly with interval improved lung volumes and mildly decreased vascular congestion, no overt edema.    Electronically Signed   By: Genevie Ann M.D.   On: 01/02/2015 11:57    Scheduled Meds: . albuterol  2.5 mg Nebulization Q6H  . calcitRIOL  0.25 mcg Oral Daily  . donepezil  10 mg Oral QHS  . enoxaparin (LOVENOX) injection  30 mg Subcutaneous Q24H  . folic acid  1 mg Oral q morning - 10a  . furosemide  20 mg Intravenous Daily  . hydrALAZINE  75 mg Oral 3 times per day  . hydrocortisone  25 mg Rectal BID  . insulin aspart  0-5 Units Subcutaneous QHS  . insulin aspart  0-9 Units Subcutaneous TID WC  . insulin detemir  4 Units Subcutaneous QHS  . isosorbide mononitrate  15 mg Oral q morning - 10a  . levETIRAcetam  500 mg Oral BID  . metoprolol succinate  50 mg Oral q morning - 10a  . mupirocin ointment  1 application Nasal BID  . pantoprazole  40 mg Oral q morning - 10a  . pravastatin  20 mg Oral QHS  . senna  1 tablet Oral BID  . sodium bicarbonate  1,300 mg Oral TID  . sodium chloride  3  mL Intravenous Q12H  . torsemide  40 mg Oral Daily   Continuous Infusions: . dextrose 70 mL/hr at 01/02/15 1741    Principal Problem:   Acute on chronic respiratory failure with hypoxia Active Problems:   CKD (chronic kidney disease) stage 4, GFR 15-29 ml/min   Diabetes   Iron deficiency anemia   Dementia   Cardiomyopathy, ischemic   Anemia   Hypernatremia   Elevated troponin    Time spent: 35 minutes    [facility] Pager [phone]. If 7PM-7AM, please contact night-coverage at [email or web address], password [facility] 01/03/2015, 9:51 AM  LOS: 1 day

## 2015-01-03 NOTE — Clinical Social Work Psychosocial (Signed)
Clinical Social Work Department BRIEF PSYCHOSOCIAL ASSESSMENT 01/03/2015  Patient:  [name], [name]     Account Number:  [id number]     Admit date:  01/02/2015  Clinical Social Worker:  Wyatt Haste  Date/Time:  01/03/2015 12:08 PM  Referred by:  Physician  Date Referred:  01/03/2015 Referred for  SNF Placement   Other Referral:   Interview type:  Family Other interview type:   daughter- Vicky    PSYCHOSOCIAL DATA Living Status:  FAMILY Admitted from facility:   Level of care:   Primary support name:  Vicky Primary support relationship to patient:  CHILD, ADULT Degree of support available:   supportive    CURRENT CONCERNS Current Concerns  Post-Acute Placement   Other Concerns:    SOCIAL WORK ASSESSMENT / PLAN CSW met with pt's daughter [name] at bedside. Pt sleeping at time of assessment. [name] reports that [name] is HCPOA and pt lives with her. CSW spoke with Vicky. Pt is known to CSW from admission last week. Plan was for pt to d/c to [facility] over weekend. Family decided to take pt home on day of d/c. Home health RN was with pt yesterday and her sats were 80% on 2L oxygen. EMS was called and pt admitted. Referral made to CSW for d/c plan. [name] verifies that she made decision to take pt home over the weekend. She feels that she can continue to provide care to pt at home. Vicky did ask about PNC, and CSW called but no beds available. Vicky states, "I'm going to take care of my mom like she took care of me." Vicky requests a wheelchair. CM notified.   Assessment/plan status:  Referral to [employer] Other assessment/ plan:   Information/referral to community resources:   CM to resume home health    PATIENT'S/FAMILY'S RESPONSE TO PLAN OF CARE: Pt unable to participate in assessment. Anticipate d/c soon. [name] is confident that she can provide care at home for pt and does not want to pursue placement. CSW will sign off, but can be reconsulted if needed.        Benay Pike, [place]

## 2015-01-03 NOTE — Progress Notes (Signed)
[name]  MRN: [id number]  DOB/AGE: 74-Jan-1942 74 y.o.  Primary Care Physician:HILL,GERALD K, MD  Admit date: 01/02/2015  Chief Complaint:  Chief Complaint  Patient presents with  . Shortness of Breath    S-Pt presented on  01/02/2015 with  Chief Complaint  Patient presents with  . Shortness of Breath  .    Pt offers no complaints.pt today am was more communicative than yesterday.   Meds . albuterol  2.5 mg Nebulization Q6H  . calcitRIOL  0.25 mcg Oral Daily  . donepezil  10 mg Oral QHS  . enoxaparin (LOVENOX) injection  30 mg Subcutaneous Q24H  . folic acid  1 mg Oral q morning - 10a  . furosemide  20 mg Intravenous Daily  . hydrALAZINE  75 mg Oral 3 times per day  . hydrocortisone  25 mg Rectal BID  . insulin aspart  0-5 Units Subcutaneous QHS  . insulin aspart  0-9 Units Subcutaneous TID WC  . insulin detemir  4 Units Subcutaneous QHS  . isosorbide mononitrate  15 mg Oral q morning - 10a  . levETIRAcetam  500 mg Oral BID  . metoprolol succinate  50 mg Oral q morning - 10a  . mupirocin ointment  1 application Nasal BID  . pantoprazole  40 mg Oral q morning - 10a  . pravastatin  20 mg Oral QHS  . senna  1 tablet Oral BID  . sodium bicarbonate  1,300 mg Oral TID  . sodium chloride  3 mL Intravenous Q12H  . torsemide  40 mg Oral Daily      Physical Exam: Vital signs in last 24 hours: Temp:  [97.5 F (36.4 C)-98.3 F (36.8 C)] 98.1 F (36.7 C) (02/10 0528) Pulse Rate:  [53-92] 88 (02/10 0656) Resp:  [17-21] 18 (02/10 0528) BP: (137-171)/(54-106) 144/69 mmHg (02/10 0656) SpO2:  [98 %-100 %] 100 % (02/10 0714) Weight:  [159 lb 13.3 oz (72.5 kg)-164 lb 10.9 oz (74.7 kg)] 162 lb 7.7 oz (73.7 kg) (02/10 0651) Weight change:  Last BM Date: 01/01/15  Intake/Output from previous day: 02/09 0701 - 02/10 0700 In: 1105.2 [P.O.:240; I.V.:865.2] Out: 1350 [Urine:1350]     Physical Exam: General- pt is awake,lying comfortably in bed Resp- No acute REsp  distress, CTA B/L NO Rhonchi CVS- S1S2 regular in rate and rhythm GIT- BS+, soft, NT, ND EXT- NO LE Edema, No Cyanosis   Lab Results: CBC  Recent Labs  01/02/15 1125 01/03/15 0555  WBC 5.2 10.1  HGB 9.3* 9.9*  HCT 31.7* 34.3*  PLT 166 190    BMET  Recent Labs  01/02/15 1125 01/03/15 0555  NA 149* 147*  K 4.3 4.5  CL 112 106  CO2 30 30  GLUCOSE 107* 205*  BUN 67* 67*  CREATININE 3.58* 3.43*  CALCIUM 8.8 8.9   Trend Creat  2016 3.58=>3.43   4.29=>3.64=>4.10=>3.95 ( last admission) 2015 2.0--3.75( Multiple admission) 2014 1.4--2.5 20131.5--1.7 20121.26    MICRO No results found for this or any previous visit (from the past 240 hour(s)).    Lab Results  Component Value Date   PTH 268.4* 04/28/2014   CALCIUM 8.9 01/03/2015   PHOS 4.3 12/25/2014         Impression: 1)Renal CKD stage 4 .  Creat at baseline  CKD since 2013  CKD secondary to DM/HTN/Age associated decline  Progression of CKD marked with multiple AKI in 2015 and now in 2016  Proteinuria Present in U/a  2)HTN  Medication-  On Diuretics. On Beta blockers On Vasodilators  3)Anemia HGb at goal (9--11) On ESA  4)CKD Mineral-Bone Disorder PTH elevated. Secondary Hyperparathyroidism present. Phosphorus at goal.    5)Electrolytes Normokalemic Hypernatremic improving  6)Acid base Co2 at goal  7) CAD- trops cycled          Plan:  Will continue current care     [place] S 01/03/2015, 9:11 AM

## 2015-01-04 ENCOUNTER — Other Ambulatory Visit (HOSPITAL_COMMUNITY): Payer: Self-pay | Admitting: Oncology

## 2015-01-04 DIAGNOSIS — D649 Anemia, unspecified: Secondary | ICD-10-CM

## 2015-01-04 DIAGNOSIS — N390 Urinary tract infection, site not specified: Secondary | ICD-10-CM | POA: Diagnosis present

## 2015-01-04 LAB — CBC
HEMATOCRIT: 27.5 % — AB (ref 36.0–46.0)
HEMOGLOBIN: 8.2 g/dL — AB (ref 12.0–15.0)
MCH: 24.7 pg — AB (ref 26.0–34.0)
MCHC: 29.8 g/dL — AB (ref 30.0–36.0)
MCV: 82.8 fL (ref 78.0–100.0)
Platelets: 153 10*3/uL (ref 150–400)
RBC: 3.32 MIL/uL — ABNORMAL LOW (ref 3.87–5.11)
RDW: 26.7 % — ABNORMAL HIGH (ref 11.5–15.5)
WBC: 8.4 10*3/uL (ref 4.0–10.5)

## 2015-01-04 LAB — BASIC METABOLIC PANEL
Anion gap: 4 — ABNORMAL LOW (ref 5–15)
BUN: 65 mg/dL — AB (ref 6–23)
CO2: 30 mmol/L (ref 19–32)
Calcium: 8 mg/dL — ABNORMAL LOW (ref 8.4–10.5)
Chloride: 110 mmol/L (ref 96–112)
Creatinine, Ser: 3.5 mg/dL — ABNORMAL HIGH (ref 0.50–1.10)
GFR calc Af Amer: 14 mL/min — ABNORMAL LOW (ref >90)
GFR, EST NON AFRICAN AMERICAN: 12 mL/min — AB (ref >90)
Glucose, Bld: 139 mg/dL — ABNORMAL HIGH (ref 70–99)
Potassium: 3.8 mmol/L (ref 3.5–5.1)
SODIUM: 144 mmol/L (ref 135–145)

## 2015-01-04 LAB — GLUCOSE, CAPILLARY
GLUCOSE-CAPILLARY: 128 mg/dL — AB (ref 70–99)
GLUCOSE-CAPILLARY: 331 mg/dL — AB (ref 70–99)
Glucose-Capillary: 156 mg/dL — ABNORMAL HIGH (ref 70–99)
Glucose-Capillary: 177 mg/dL — ABNORMAL HIGH (ref 70–99)

## 2015-01-04 MED ORDER — CEFTRIAXONE SODIUM IN DEXTROSE 20 MG/ML IV SOLN
1.0000 g | INTRAVENOUS | Status: DC
  Administered 2015-01-04 – 2015-01-07 (×4): 1 g via INTRAVENOUS
  Filled 2015-01-04 (×5): qty 50

## 2015-01-04 MED ORDER — DARBEPOETIN ALFA 60 MCG/0.3ML IJ SOSY
60.0000 ug | PREFILLED_SYRINGE | Freq: Once | INTRAMUSCULAR | Status: AC
  Administered 2015-01-04: 60 ug via SUBCUTANEOUS
  Filled 2015-01-04: qty 0.3

## 2015-01-04 NOTE — Progress Notes (Signed)
Nutrition Brief Note  Patient identified on the [facility] Report  Wt Readings from Last 15 Encounters:  01/04/15 162 lb 14.7 oz (73.9 kg)  12/31/14 169 lb 12.1 oz (77 kg)  10/25/14 173 lb 12.8 oz (78.835 kg)  10/16/14 154 lb 3.2 oz (69.945 kg)  10/07/14 130 lb (58.968 kg)  09/26/14 128 lb 3.2 oz (58.151 kg)  09/04/14 135 lb (61.236 kg)  08/14/14 126 lb 4.8 oz (57.289 kg)  07/27/14 131 lb 6.4 oz (59.603 kg)  07/20/14 140 lb 3.2 oz (63.594 kg)  06/26/14 138 lb 7.2 oz (62.8 kg)  06/16/14 131 lb (59.421 kg)  06/12/14 133 lb 6.1 oz (60.5 kg)  05/02/14 143 lb 1.3 oz (64.9 kg)  03/18/14 144 lb 2.9 oz (65.4 kg)    Body mass index is 26.31 kg/(m^2). Patient meets criteria for overweight based on current BMI.   Current diet order is dysphagia 3, patient is consuming approximately 75% of meals at this time. She ate 100% of her breakfast this morning. Labs and medications reviewed.   Spoke with patient who says she has been eating very well both now and prior to admission. She says her weight can vary anywhere from 140-160. She denies GI distress- any n/v/ or c/d. Her energy level has not changed  No nutrition interventions warranted at this time. If nutrition issues arise, please consult RD.   Burtis Junes RD, LDN Nutrition Pager: [phone] 01/04/2015 9:45 AM

## 2015-01-04 NOTE — Progress Notes (Signed)
Subjective: Interval History: Patient denies any nausea or vomiting. She denies also any difficulty in breathing.   Objective: Vital signs in last 24 hours: Temp:  [98.4 F (36.9 C)-98.8 F (37.1 C)] 98.7 F (37.1 C) (02/11 0559) Pulse Rate:  [68-88] 72 (02/11 0559) Resp:  [20] 20 (02/11 0559) BP: (112-133)/(42-70) 118/59 mmHg (02/11 0559) SpO2:  [94 %-100 %] 96 % (02/11 0725) FiO2 (%):  [98 %] 98 % (02/11 0257) Weight:  [73.9 kg (162 lb 14.7 oz)] 73.9 kg (162 lb 14.7 oz) (02/11 0559) Weight change: 1.4 kg (3 lb 1.4 oz)  Intake/Output from previous day: 02/10 0701 - 02/11 0700 In: 1420 [P.O.:720; I.V.:700] Out: 1000 [Urine:1000] Intake/Output this shift:    Patient is alert. Patient alert and in no apparent distress. Chest: Decreased breath sound no wheezing   her heart exam regular rate and rhythm Extremities trace edema  Lab Results:  Recent Labs  01/03/15 0555 01/04/15 0741  WBC 10.1 8.4  HGB 9.9* 8.2*  HCT 34.3* 27.5*  PLT 190 153   BMET:   Recent Labs  01/03/15 0555 01/04/15 0741  NA 147* 144  K 4.5 3.8  CL 106 110  CO2 30 30  GLUCOSE 205* 139*  BUN 67* 65*  CREATININE 3.43* 3.50*  CALCIUM 8.9 8.0*   No results for input(s): PTH in the last 72 hours. Iron Studies: No results for input(s): IRON, TIBC, TRANSFERRIN, FERRITIN in the last 72 hours.  Studies/Results: Nm Pulmonary Perf And Vent  01/02/2015   CLINICAL DATA:  Chest pain and shortness of breath for several hours  EXAM: NUCLEAR MEDICINE VENTILATION - PERFUSION LUNG SCAN  Views: Anterior, posterior, left lateral, right lateral, RPO, LPO, RAO, LAO -ventilation and perfusion  Radionuclide: Technetium 40m DTPA -ventilation; Technetium 70m macroaggregated albumin-perfusion  Dose:  40.0 mCi-ventilation; 6.2 mCi-perfusion  COMPARISON:  Chest radiograph January 02, 2015  FINDINGS: Ventilation: There are no focal ventilation defects appreciable. Heart is noted to be enlarged. Prominence in the right  paratracheal region consistent with great vessel prominence also results in photopenia in this portion of the ventilation study.  Perfusion: Cardiomegaly is noted. There are no appreciable perfusion defects. There is no ventilation/perfusion mismatch.  IMPRESSION: No appreciable ventilation or perfusion defects. This study constitutes a very low probability of pulmonary embolus.   Electronically Signed   By: Lowella Grip III M.D.   On: 01/02/2015 16:30   Dg Chest Portable 1 View  01/02/2015   CLINICAL DATA:  74 year old female with shortness of Breath. Initial encounter.  EXAM: PORTABLE CHEST - 1 VIEW  COMPARISON:  12/28/2014 and earlier.  FINDINGS: Portable AP upright view at 1117 hours. Sequelae of CABG. Stable cardiomegaly and mediastinal contours. Interval improved lung volumes and mildly decreased vascular congestion. No overt edema. No pneumothorax, pleural effusion or consolidation.  IMPRESSION: Cardiomegaly with interval improved lung volumes and mildly decreased vascular congestion, no overt edema.   Electronically Signed   By: Genevie Ann M.D.   On: 01/02/2015 11:57    I have reviewed the patient's current medications.  Assessment/Plan: Problem #1 acute kidney injury possibly secondary to prerenal syndrome. her renal function is stable. Presently she doesn't have any uremic signs and symptoms. Possibly this may be her baseline. Problem #2 hypernatremia: Her sodium has corrected. Problem #3 hypertension: Her blood pressure is well controlled Problem #4 chronic renal failure stage IV. Presently she is asymptomatic. Problem #5 diabetes. Problem 6 history of seizure disorder: No recent history of seizure activity. Problem #7  metabolic bone disease: Her calcium and her phosphorus is  in  Range Problem #8 anemia: Her hemoglobin and hematocrit has declined. Patient with history of GI bleeding. Problem #9 hypokalemia most likely from diuretics. Her potassium has corrected. Plan: We'll continue  with oral Lasix. We'll check her basic metabolic panel and phosphorus in the morning We'll recheck her iron studies in the morning.    LOS: 2 days   Raysha Tilmon S 01/04/2015,8:29 AM

## 2015-01-04 NOTE — Progress Notes (Signed)
[facility] PROGRESS NOTE  [name] DQQ:[id number] DOB: 1941/07/09 DOA: 01/02/2015 PCP: [name], MD  Assessment/Plan: Hypoxia: Acute on chronic. Back to baseline this am. VQ scan with low risk. S/p lasix IV. Hx aspiration/dysphagia and on dysphagia diet.   Active Problems: Hypernatremia: resolved this am. Appreciate nephrology assistance. Defer IV fluids and diuresis. Continuing fluids and lasix. monitor    Elevated troponin: No report of chest pain. Trended downward. Vital signs are stable.   UTI: obtain urine culture and start rocephin. She is afebrile.    CKD (chronic kidney disease) stage 4, GFR 15-29 ml/min: Creatinine up slightly but remains close to baseline. Nephrology following.    Diabetes: CBG range 128-232. We'll continue Levemir at half her home dose. 11 A1c 7.52 weeks ago. Will continue car modified diet. Will monitor   Iron deficiency anemia: As well as anemia of chronic disease. Current hemoglobin 8.2. and baseline seems to be between 9-10. 3 weeks ago Hg 11.0  Review indicates she is followed by hematology and gets Aranesp every 2 weeks. In addition she gets IV iron occasionally. Family member indicates she has an appointment with hematology 2 days ago. Discussed with Heme who recommended Aranesp 5mcg.    Dementia: Much more alert today. Appears to be at baseline   Cardiomyopathy, ischemic/ combined systolic and diastolic heart failure. Last echo in July 2015 showed an EF of 19% grade 2 diastolic dysfunction. Last admission Lasix was discontinued secondary to worsening kidney disease and Demadex dose was reduced. Chest x-ray showed no fluid overload. Weight 73.9 from 74.7 on admission.   Dysphagia: evaluated last hospitalization. On recommended diet   Code Status: full Family Communication: none at bedside Disposition Plan: home when ready   Consultants:    Procedures:  none  Antibiotics:  Rocephin  01/04/15>>  HPI/Subjective: Awake alert attempting to feed self. Denies pain/discomfort or sob  Objective: Filed Vitals:   01/04/15 0559  BP: 118/59  Pulse: 72  Temp: 98.7 F (37.1 C)  Resp: 20    Intake/Output Summary (Last 24 hours) at 01/04/15 0935 Last data filed at 01/04/15 0851  Gross per 24 hour  Intake   1540 ml  Output   1000 ml  Net    540 ml   Filed Weights   01/03/15 0528 01/03/15 0651 01/04/15 0559  Weight: 74.7 kg (164 lb 10.9 oz) 73.7 kg (162 lb 7.7 oz) 73.9 kg (162 lb 14.7 oz)    Exam:   General:  Obese appears comfortable  Cardiovascular: RRR no m/g/r trace LE edema   Respiratory: normal effort BS coarse no wheeze  Abdomen: obese soft non-tender +BS  Musculoskeletal: joints without swelling or erythema   Data Reviewed: Basic Metabolic Panel:  Recent Labs Lab 12/30/14 0615 12/30/14 0656 12/31/14 0635 01/02/15 1125 01/03/15 0555 01/04/15 0741  NA 134*  --  139 149* 147* 144  K 3.8  --  3.9 4.3 4.5 3.8  CL 104  --  110 112 106 110  CO2 23  --  27 30 30 30   GLUCOSE 159*  --  205* 107* 205* 139*  BUN 63*  --  67* 67* 67* 65*  CREATININE 3.62* 3.85* 3.74* 3.58* 3.43* 3.50*  CALCIUM 7.8*  --  8.1* 8.8 8.9 8.0*   Liver Function Tests: No results for input(s): AST, ALT, ALKPHOS, BILITOT, PROT, ALBUMIN in the last 168 hours. No results for input(s): LIPASE, AMYLASE in the last 168 hours. No results for input(s): AMMONIA in the last 168  hours. CBC:  Recent Labs Lab 01/02/15 1125 01/03/15 0555 01/04/15 0741  WBC 5.2 10.1 8.4  NEUTROABS 3.3  --   --   HGB 9.3* 9.9* 8.2*  HCT 31.7* 34.3* 27.5*  MCV 82.8 84.7 82.8  PLT 166 190 153   Cardiac Enzymes:  Recent Labs Lab 01/02/15 1125 01/02/15 1707 01/02/15 2343  TROPONINI 0.13* 0.12* 0.10*   BNP (last 3 results)  Recent Labs  01/02/15 1125  BNP >4500.0*    ProBNP (last 3 results)  Recent Labs  03/15/14 1645 04/27/14 1536 06/09/14 1021  PROBNP 37134.0* 51631.0* 30358.0*     CBG:  Recent Labs Lab 01/03/15 1153 01/03/15 1514 01/03/15 1613 01/03/15 2109 01/04/15 0725  GLUCAP 222* 221* 213* 222* 128*    No results found for this or any previous visit (from the past 240 hour(s)).   Studies: Nm Pulmonary Perf And Vent  01/02/2015   CLINICAL DATA:  Chest pain and shortness of breath for several hours  EXAM: NUCLEAR MEDICINE VENTILATION - PERFUSION LUNG SCAN  Views: Anterior, posterior, left lateral, right lateral, RPO, LPO, RAO, LAO -ventilation and perfusion  Radionuclide: Technetium 62m DTPA -ventilation; Technetium 52m macroaggregated albumin-perfusion  Dose:  40.0 mCi-ventilation; 6.2 mCi-perfusion  COMPARISON:  Chest radiograph January 02, 2015  FINDINGS: Ventilation: There are no focal ventilation defects appreciable. Heart is noted to be enlarged. Prominence in the right paratracheal region consistent with great vessel prominence also results in photopenia in this portion of the ventilation study.  Perfusion: Cardiomegaly is noted. There are no appreciable perfusion defects. There is no ventilation/perfusion mismatch.  IMPRESSION: No appreciable ventilation or perfusion defects. This study constitutes a very low probability of pulmonary embolus.   Electronically Signed   By: Lowella Grip III M.D.   On: 01/02/2015 16:30   Dg Chest Portable 1 View  01/02/2015   CLINICAL DATA:  74 year old female with shortness of Breath. Initial encounter.  EXAM: PORTABLE CHEST - 1 VIEW  COMPARISON:  12/28/2014 and earlier.  FINDINGS: Portable AP upright view at 1117 hours. Sequelae of CABG. Stable cardiomegaly and mediastinal contours. Interval improved lung volumes and mildly decreased vascular congestion. No overt edema. No pneumothorax, pleural effusion or consolidation.  IMPRESSION: Cardiomegaly with interval improved lung volumes and mildly decreased vascular congestion, no overt edema.   Electronically Signed   By: Genevie Ann M.D.   On: 01/02/2015 11:57    Scheduled  Meds: . albuterol  2.5 mg Nebulization Q6H  . calcitRIOL  0.25 mcg Oral Daily  . cefTRIAXone (ROCEPHIN)  IV  1 g Intravenous Q24H  . enoxaparin (LOVENOX) injection  30 mg Subcutaneous Q24H  . folic acid  1 mg Oral q morning - 10a  . furosemide  20 mg Intravenous Daily  . hydrALAZINE  75 mg Oral 3 times per day  . hydrocortisone  25 mg Rectal BID  . insulin aspart  0-15 Units Subcutaneous TID WC  . insulin aspart  0-5 Units Subcutaneous QHS  . insulin detemir  4 Units Subcutaneous QHS  . isosorbide mononitrate  15 mg Oral q morning - 10a  . levETIRAcetam  500 mg Oral BID  . metoprolol succinate  50 mg Oral q morning - 10a  . mupirocin ointment  1 application Nasal BID  . pantoprazole  40 mg Oral q morning - 10a  . pravastatin  20 mg Oral QHS  . senna  1 tablet Oral BID  . sodium bicarbonate  1,300 mg Oral TID  . sodium chloride  3 mL Intravenous Q12H   Continuous Infusions: . dextrose 1 mL (01/04/15 0528)    Principal Problem:   Acute on chronic respiratory failure with hypoxia Active Problems:   CKD (chronic kidney disease) stage 4, GFR 15-29 ml/min   Diabetes   Iron deficiency anemia   Dementia   Cardiomyopathy, ischemic   Anemia   Hypernatremia   Elevated troponin   UTI (urinary tract infection)    Time spent: 35 minutes    [facility] Pager [phone]. If 7PM-7AM, please contact night-coverage at [email or web address], password [facility] 01/04/2015, 9:35 AM  LOS: 2 days

## 2015-01-04 NOTE — Progress Notes (Signed)
ANTIBIOTIC CONSULT NOTE - INITIAL  Pharmacy Consult for Rocephin Indication: UTI  No Known Allergies  Patient Measurements: Height: 5\' 6"  (167.6 cm) Weight: 162 lb 14.7 oz (73.9 kg) IBW/kg (Calculated) : 59.3  Vital Signs: Temp: 98.7 F (37.1 C) (02/11 0559) Temp Source: Oral (02/11 0559) BP: 118/59 mmHg (02/11 0559) Pulse Rate: 72 (02/11 0559) Intake/Output from previous day: 02/10 0701 - 02/11 0700 In: 9826 [P.O.:720; I.V.:700] Out: 1000 [Urine:1000] Intake/Output from this shift:    Labs:  Recent Labs  01/02/15 1125 01/03/15 0555  WBC 5.2 10.1  HGB 9.3* 9.9*  PLT 166 190  CREATININE 3.58* 3.43*   Estimated Creatinine Clearance: 15 mL/min (by C-G formula based on Cr of 3.43). No results for input(s): VANCOTROUGH, VANCOPEAK, VANCORANDOM, GENTTROUGH, GENTPEAK, GENTRANDOM, TOBRATROUGH, TOBRAPEAK, TOBRARND, AMIKACINPEAK, AMIKACINTROU, AMIKACIN in the last 72 hours.   Microbiology: No results found for this or any previous visit (from the past 720 hour(s)).  Medical History: Past Medical History  Diagnosis Date  . Type 2 diabetes mellitus   . Essential hypertension, benign   . History of stroke     Previously on Coumadin  . MI, old     Reported 26  . Seizure disorder   . Coronary atherosclerosis of native coronary artery     Multivessel status post CABG 2002  . History of GI bleed     Erosive gastritis and duodenitis by EGD 2002  . Mixed hyperlipidemia   . Ischemic cardiomyopathy     LVEF 40-45% March 2013  . Dementia   . CKD (chronic kidney disease) stage 4, GFR 15-29 ml/min   . CHF (congestive heart failure)   . Chronic respiratory failure with hypoxia   . Stroke   . Seizures   . Anemia of chronic renal failure, stage 4 (severe) 01/02/2015   Anti-infectives    Start     Dose/Rate Route Frequency Ordered Stop   01/04/15 1000  cefTRIAXone (ROCEPHIN) 1 g in dextrose 5 % 50 mL IVPB - Premix     1 g 100 mL/hr over 30 Minutes Intravenous Every 24 hours  01/04/15 0737        Assessment: 74yo female with h/o CKD.  Estimated Creatinine Clearance: 15 mL/min (by C-G formula based on Cr of 3.43).  Asked to initiate Rocephin for UTI (no renal adjustment needed)  Goal of Therapy:  Eradicate infection.  Plan:  Rocephin 1gm IV q24hrs Monitor labs, cultures, and progress  Hart Robinsons A 01/04/2015,7:39 AM

## 2015-01-05 LAB — IRON AND TIBC
IRON: 27 ug/dL — AB (ref 42–145)
SATURATION RATIOS: 11 % — AB (ref 20–55)
TIBC: 236 ug/dL — AB (ref 250–470)
UIBC: 209 ug/dL (ref 125–400)

## 2015-01-05 LAB — CBC
HCT: 27.2 % — ABNORMAL LOW (ref 36.0–46.0)
HEMOGLOBIN: 8 g/dL — AB (ref 12.0–15.0)
MCH: 24.2 pg — AB (ref 26.0–34.0)
MCHC: 29.4 g/dL — AB (ref 30.0–36.0)
MCV: 82.2 fL (ref 78.0–100.0)
PLATELETS: 171 10*3/uL (ref 150–400)
RBC: 3.31 MIL/uL — ABNORMAL LOW (ref 3.87–5.11)
RDW: 26.4 % — AB (ref 11.5–15.5)
WBC: 6.3 10*3/uL (ref 4.0–10.5)

## 2015-01-05 LAB — GLUCOSE, CAPILLARY
GLUCOSE-CAPILLARY: 130 mg/dL — AB (ref 70–99)
Glucose-Capillary: 190 mg/dL — ABNORMAL HIGH (ref 70–99)
Glucose-Capillary: 250 mg/dL — ABNORMAL HIGH (ref 70–99)
Glucose-Capillary: 161 mg/dL — ABNORMAL HIGH (ref 70–99)
Glucose-Capillary: 179 mg/dL — ABNORMAL HIGH (ref 70–99)

## 2015-01-05 LAB — BASIC METABOLIC PANEL
ANION GAP: 8 (ref 5–15)
BUN: 66 mg/dL — ABNORMAL HIGH (ref 6–23)
CALCIUM: 7.8 mg/dL — AB (ref 8.4–10.5)
CO2: 24 mmol/L (ref 19–32)
Chloride: 110 mmol/L (ref 96–112)
Creatinine, Ser: 3.69 mg/dL — ABNORMAL HIGH (ref 0.50–1.10)
GFR calc Af Amer: 13 mL/min — ABNORMAL LOW (ref >90)
GFR, EST NON AFRICAN AMERICAN: 11 mL/min — AB (ref >90)
GLUCOSE: 129 mg/dL — AB (ref 70–99)
Potassium: 4.3 mmol/L (ref 3.5–5.1)
Sodium: 142 mmol/L (ref 135–145)

## 2015-01-05 LAB — PHOSPHORUS: Phosphorus: 3.6 mg/dL (ref 2.3–4.6)

## 2015-01-05 LAB — FERRITIN: Ferritin: 370 ng/mL — ABNORMAL HIGH (ref 10–291)

## 2015-01-05 MED ORDER — ALBUTEROL SULFATE (2.5 MG/3ML) 0.083% IN NEBU
2.5000 mg | INHALATION_SOLUTION | Freq: Three times a day (TID) | RESPIRATORY_TRACT | Status: DC
  Administered 2015-01-05 – 2015-01-09 (×14): 2.5 mg via RESPIRATORY_TRACT
  Filled 2015-01-05 (×14): qty 3

## 2015-01-05 MED ORDER — DEXTROSE 5 % IV SOLN
100.0000 mg | Freq: Two times a day (BID) | INTRAVENOUS | Status: DC
  Administered 2015-01-05: 100 mg via INTRAVENOUS
  Filled 2015-01-05 (×6): qty 10

## 2015-01-05 MED ORDER — INSULIN ASPART 100 UNIT/ML ~~LOC~~ SOLN
2.0000 [IU] | Freq: Three times a day (TID) | SUBCUTANEOUS | Status: DC
  Administered 2015-01-05 – 2015-01-08 (×9): 2 [IU] via SUBCUTANEOUS

## 2015-01-05 MED ORDER — FUROSEMIDE 10 MG/ML IJ SOLN
INTRAMUSCULAR | Status: AC
  Filled 2015-01-05: qty 10

## 2015-01-05 MED ORDER — TORSEMIDE 20 MG PO TABS
40.0000 mg | ORAL_TABLET | Freq: Every day | ORAL | Status: DC
  Administered 2015-01-05: 40 mg via ORAL
  Filled 2015-01-05: qty 2

## 2015-01-05 NOTE — Progress Notes (Signed)
[facility] PROGRESS NOTE  [name] KKX:[id number] DOB: [birth year] DOA: 01/02/2015 PCP: [name], MD  Assessment/Plan: Hypoxia: Acute on chronic. Back to baseline. VQ scan with low risk. S/p lasix IV. Hx aspiration/dysphagia and on dysphagia diet.   Active Problems: Hypernatremia: resolved.Iv fluids discontinued.    Elevated troponin: No report of chest pain. Trended downward. Vital signs are stable.   UTI: afebrile and non-toxic appearing. Continue  Rocephin day #2.    CKD (chronic kidney disease) stage 4, GFR 15-29 ml/min: Creatinine continues to trend up slightly. IV lasix stopped. Urine output 850.  Nephrology following.    Diabetes: CBG range 156-331. Will add meal coverage. Continue Levemir at half her home dose for now.  A1c 7.52  Will continue car modified diet. Appetite improving.  Will monitor   Iron deficiency anemia: As well as anemia of chronic disease. Current hemoglobin 8.0. and baseline seems to be between 9-10. 3 weeks ago Hg 11.0 Review indicates she is followed by hematology and gets Aranesp every 2 weeks. Dose due 01/04/15 and was given. In additition she gets IV iron occasionally.    Dementia: Much more alert today. Appears to be at baseline   Cardiomyopathy, ischemic/ combined systolic and diastolic heart failure. Last echo in July 2015 showed an EF of 16% grade 2 diastolic dysfunction. Last admission Lasix was discontinued secondary to worsening kidney disease and Demadex dose was reduced. Chest x-ray showed no fluid overload. Weight 75.8 from 74.7 on admission.   Dysphagia: evaluated last hospitalization. On recommended diet  Code Status: full Family Communication: none present Disposition Plan: home when ready   Consultants:  nephrology  Procedures:  none  Antibiotics:  Rocephin 01/04/15>>  HPI/Subjective: Awake smiling denies pain/discomfort  Objective: Filed Vitals:   01/05/15 0804  BP: 140/52  Pulse: 98  Temp: 99 F  (37.2 C)  Resp: 18    Intake/Output Summary (Last 24 hours) at 01/05/15 0940 Last data filed at 01/05/15 0513  Gross per 24 hour  Intake    480 ml  Output    850 ml  Net   -370 ml   Filed Weights   01/03/15 0651 01/04/15 0559 01/05/15 0511  Weight: 73.7 kg (162 lb 7.7 oz) 73.9 kg (162 lb 14.7 oz) 75.8 kg (167 lb 1.7 oz)    Exam:   General:  Obese appears comfortable calm  Cardiovascular: RRR no MGR trace-1+ LE edema particularly in thighs  Respiratory: normal effort BS somewhat distant and coarse. Fine crackles  Abdomen: obese soft +BS non-tender to palpation  Musculoskeletal: no clubbing or cyanosis   Data Reviewed: Basic Metabolic Panel:  Recent Labs Lab 12/31/14 0635 01/02/15 1125 01/03/15 0555 01/04/15 0741 01/05/15 0549  NA 139 149* 147* 144 142  K 3.9 4.3 4.5 3.8 4.3  CL 110 112 106 110 110  CO2 27 30 30 30 24   GLUCOSE 205* 107* 205* 139* 129*  BUN 67* 67* 67* 65* 66*  CREATININE 3.74* 3.58* 3.43* 3.50* 3.69*  CALCIUM 8.1* 8.8 8.9 8.0* 7.8*  PHOS  --   --   --   --  3.6   Liver Function Tests: No results for input(s): AST, ALT, ALKPHOS, BILITOT, PROT, ALBUMIN in the last 168 hours. No results for input(s): LIPASE, AMYLASE in the last 168 hours. No results for input(s): AMMONIA in the last 168 hours. CBC:  Recent Labs Lab 01/02/15 1125 01/03/15 0555 01/04/15 0741 01/05/15 0549  WBC 5.2 10.1 8.4 6.3  NEUTROABS 3.3  --   --   --  HGB 9.3* 9.9* 8.2* 8.0*  HCT 31.7* 34.3* 27.5* 27.2*  MCV 82.8 84.7 82.8 82.2  PLT 166 190 153 171   Cardiac Enzymes:  Recent Labs Lab 01/02/15 1125 01/02/15 1707 01/02/15 2343  TROPONINI 0.13* 0.12* 0.10*   BNP (last 3 results)  Recent Labs  01/02/15 1125  BNP >4500.0*    ProBNP (last 3 results)  Recent Labs  03/15/14 1645 04/27/14 1536 06/09/14 1021  PROBNP 37134.0* 51631.0* 30358.0*    CBG:  Recent Labs Lab 01/04/15 1112 01/04/15 1616 01/04/15 2029 01/04/15 2349 01/05/15 0718   GLUCAP 156* 177* 331* 190* 130*    No results found for this or any previous visit (from the past 240 hour(s)).   Studies: No results found.  Scheduled Meds: . albuterol  2.5 mg Nebulization Q6H  . calcitRIOL  0.25 mcg Oral Daily  . cefTRIAXone (ROCEPHIN)  IV  1 g Intravenous Q24H  . enoxaparin (LOVENOX) injection  30 mg Subcutaneous Q24H  . folic acid  1 mg Oral q morning - 10a  . hydrALAZINE  75 mg Oral 3 times per day  . hydrocortisone  25 mg Rectal BID  . insulin aspart  0-15 Units Subcutaneous TID WC  . insulin aspart  0-5 Units Subcutaneous QHS  . insulin aspart  2 Units Subcutaneous TID WC  . insulin detemir  4 Units Subcutaneous QHS  . isosorbide mononitrate  15 mg Oral q morning - 10a  . levETIRAcetam  500 mg Oral BID  . metoprolol succinate  50 mg Oral q morning - 10a  . mupirocin ointment  1 application Nasal BID  . pantoprazole  40 mg Oral q morning - 10a  . pravastatin  20 mg Oral QHS  . senna  1 tablet Oral BID  . sodium bicarbonate  1,300 mg Oral TID  . sodium chloride  3 mL Intravenous Q12H  . torsemide  40 mg Oral Daily   Continuous Infusions:   Principal Problem:   Acute on chronic respiratory failure with hypoxia Active Problems:   CKD (chronic kidney disease) stage 4, GFR 15-29 ml/min   Diabetes   Iron deficiency anemia   Dementia   Cardiomyopathy, ischemic   Anemia   Hypernatremia   Elevated troponin   UTI (urinary tract infection)    Time spent: 35 minutes    [place] [facility] Pager [phone]. If 7PM-7AM, please contact night-coverage at [email or web address], password [facility] 01/05/2015, 9:40 AM  LOS: 3 days

## 2015-01-05 NOTE — Progress Notes (Signed)
Patient had a 12 beat run of V-tach.  Vitals stable, unsymptomatic.  On call MD notified.   Will continue to monitor.

## 2015-01-05 NOTE — Progress Notes (Signed)
Inpatient Diabetes Program Recommendations  AACE/ADA: New Consensus Statement on Inpatient Glycemic Control (2013)  Target Ranges:  Prepandial:   less than 140 mg/dL      Peak postprandial:   less than 180 mg/dL (1-2 hours)      Critically ill patients:  140 - 180 mg/dL    Results for DEION, FORGUE (MRN [id number]) as of 01/05/2015 07:24  Ref. Range 01/04/2015 07:25 01/04/2015 11:12 01/04/2015 16:16 01/04/2015 20:29 01/04/2015 23:49 01/05/2015 07:18  Glucose-Capillary Latest Range: 70-99 mg/dL 128 (H) 156 (H) 177 (H) 331 (H) 190 (H) 130 (H)   Current orders for Inpatient glycemic control: Levemir 4 units QHS, Novolog 0-15 units TID with meals, Novolog 0-5 units HS  Inpatient Diabetes Program Recommendations Insulin - Meal Coverage: Please consider ordering Novolog 2 units TID with meals for meal coverage.  Thanks, Barnie Alderman, RN, MSN, CCRN, CDE Diabetes Coordinator Inpatient Diabetes Program [phone] (Team Pager) [phone] ([facility] office) [phone] [facility] office)

## 2015-01-05 NOTE — Progress Notes (Signed)
PT Cancellation Note  Patient Details Name: GAYLYN BERISH MRN: [id number] DOB: 1940-12-27   Cancelled Treatment:     Per social worker and patient's care giver report, patient is at base line and ready to return home with continued home health therapy. No further acute physical therapy needs indicated.    Devona Konig PT DPT [phone]

## 2015-01-05 NOTE — Progress Notes (Signed)
Patient OOB to chair with moderate assist. Tolerating well.

## 2015-01-05 NOTE — Progress Notes (Signed)
Subjective: Interval History: Patient offers no complaints. Presently she denies any nausea or vomiting. And no issues with her breathing.   Objective: Vital signs in last 24 hours: Temp:  [97.7 F (36.5 C)-98.1 F (36.7 C)] 98 F (36.7 C) (02/12 0511) Pulse Rate:  [63-83] 83 (02/12 0511) Resp:  [20] 20 (02/12 0511) BP: (95-129)/(53-65) 95/65 mmHg (02/12 0511) SpO2:  [93 %-100 %] 97 % (02/12 0738) Weight:  [75.8 kg (167 lb 1.7 oz)] 75.8 kg (167 lb 1.7 oz) (02/12 0511) Weight change: 1.9 kg (4 lb 3 oz)  Intake/Output from previous day: 02/11 0701 - 02/12 0700 In: 600 [P.O.:600] Out: 850 [Urine:850] Intake/Output this shift:    Patient is  awake and alert and in no apparent distress. Chest: Decreased breath sound no wheezing   her heart exam regular rate and rhythm Extremities trace edema  Lab Results:  Recent Labs  01/04/15 0741 01/05/15 0549  WBC 8.4 6.3  HGB 8.2* 8.0*  HCT 27.5* 27.2*  PLT 153 171   BMET:   Recent Labs  01/03/15 0555 01/04/15 0741  NA 147* 144  K 4.5 3.8  CL 106 110  CO2 30 30  GLUCOSE 205* 139*  BUN 67* 65*  CREATININE 3.43* 3.50*  CALCIUM 8.9 8.0*   No results for input(s): PTH in the last 72 hours. Iron Studies: No results for input(s): IRON, TIBC, TRANSFERRIN, FERRITIN in the last 72 hours.  Studies/Results: No results found.  I have reviewed the patient's current medications.  Assessment/Plan: Problem #1 acute kidney injury possibly secondary to prerenal syndrome. her renal function is stable. Patient is asymptomatic. Problem #2 hypernatremia: Her sodium has corrected. Problem #3 hypertension: Her blood pressure is well controlled Problem #4 chronic renal failure stage IV. Presently she is asymptomatic. Problem #5 diabetes. Problem 6 history of seizure disorder: No recent history of seizure activity. Problem #7 metabolic bone disease: Her calcium and her phosphorus is  in  Range Problem #8 anemia: Her hemoglobin and  hematocrit has declined. Patient with history of GI bleeding. Patient presently does not have any GI bleeding. Iron studies are pending and patient is on her at 2.18. Problem #9 hypokalemia most likely from diuretics. Her potassium has corrected. Plan: We'll continue with oral Lasix. We'll start patient on Demadex 40 mg once a day orally.    LOS: 3 days   Tynan Boesel S 01/05/2015,7:57 AM

## 2015-01-05 NOTE — Progress Notes (Signed)
Patient has and order for IVP Lasix 100 mg BID.  Called the on call MD and received new orders. 14 french foley cath placed per order using sterile technique.  Patient tolerated well.  Clear yellow urine returned.  Patient currently resting comfortably in room

## 2015-01-06 LAB — BASIC METABOLIC PANEL
Anion gap: 4 — ABNORMAL LOW (ref 5–15)
BUN: 65 mg/dL — ABNORMAL HIGH (ref 6–23)
CO2: 30 mmol/L (ref 19–32)
CREATININE: 3.61 mg/dL — AB (ref 0.50–1.10)
Calcium: 7.8 mg/dL — ABNORMAL LOW (ref 8.4–10.5)
Chloride: 109 mmol/L (ref 96–112)
GFR calc Af Amer: 13 mL/min — ABNORMAL LOW (ref >90)
GFR calc non Af Amer: 12 mL/min — ABNORMAL LOW (ref >90)
Glucose, Bld: 139 mg/dL — ABNORMAL HIGH (ref 70–99)
POTASSIUM: 4.3 mmol/L (ref 3.5–5.1)
Sodium: 143 mmol/L (ref 135–145)

## 2015-01-06 LAB — GLUCOSE, CAPILLARY
GLUCOSE-CAPILLARY: 144 mg/dL — AB (ref 70–99)
GLUCOSE-CAPILLARY: 132 mg/dL — AB (ref 70–99)
Glucose-Capillary: 132 mg/dL — ABNORMAL HIGH (ref 70–99)
Glucose-Capillary: 166 mg/dL — ABNORMAL HIGH (ref 70–99)
Glucose-Capillary: 171 mg/dL — ABNORMAL HIGH (ref 70–99)

## 2015-01-06 LAB — CBC
HEMATOCRIT: 27.3 % — AB (ref 36.0–46.0)
Hemoglobin: 7.9 g/dL — ABNORMAL LOW (ref 12.0–15.0)
MCH: 24 pg — ABNORMAL LOW (ref 26.0–34.0)
MCHC: 28.9 g/dL — AB (ref 30.0–36.0)
MCV: 83 fL (ref 78.0–100.0)
Platelets: 191 10*3/uL (ref 150–400)
RBC: 3.29 MIL/uL — AB (ref 3.87–5.11)
RDW: 25.7 % — ABNORMAL HIGH (ref 11.5–15.5)
WBC: 5.2 10*3/uL (ref 4.0–10.5)

## 2015-01-06 MED ORDER — SODIUM CHLORIDE 0.9 % IV SOLN
510.0000 mg | Freq: Once | INTRAVENOUS | Status: AC
  Administered 2015-01-06: 510 mg via INTRAVENOUS
  Filled 2015-01-06: qty 17

## 2015-01-06 MED ORDER — METOLAZONE 5 MG PO TABS
5.0000 mg | ORAL_TABLET | Freq: Two times a day (BID) | ORAL | Status: DC
  Administered 2015-01-06 – 2015-01-09 (×8): 5 mg via ORAL
  Filled 2015-01-06 (×8): qty 1

## 2015-01-06 MED ORDER — TORSEMIDE 20 MG PO TABS
60.0000 mg | ORAL_TABLET | Freq: Every day | ORAL | Status: DC
  Administered 2015-01-06 – 2015-01-09 (×4): 60 mg via ORAL
  Filled 2015-01-06 (×4): qty 3

## 2015-01-06 NOTE — Progress Notes (Signed)
[facility] PROGRESS NOTE  MARQUESA RATH TML:[id number] DOB: [birth year] DOA: 01/02/2015 PCP: [name], MD  Assessment/Plan: Acute on chronic respiratory failure with hypoxia: Possibly related to aspiration vs. Volume overload. Back to baseline. VQ scan with low risk. S/p lasix IV. Hx aspiration/dysphagia and on dysphagia diet.    Active Problems: Hypernatremia: resolved.Iv fluids discontinued.    Elevated troponin: No report of chest pain. Trended downward. Vital signs are stable.   UTI: afebrile and non-toxic appearing. Continue  Rocephin day #3.    CKD (chronic kidney disease) stage 4, GFR 15-29 ml/min: Creatinine continues to trend up slightly. Started on demadex and metolazone.  Nephrology following.    Diabetes: Continue Levemir at half her home dose for now.  A1c 7.52  Will continue car modified diet. Appetite improving.  Will monitor   Iron deficiency anemia: As well as anemia of chronic disease. Baseline seems to be between 9-10. 3 weeks ago Hg 11.0 Review indicates she is followed by hematology and gets Aranesp every 2 weeks. Dose due 01/04/15 and was given. In additition she gets IV iron occasionally. Continue to follow. 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 Last echo in July 2015 showed an EF of 68% grade 2 diastolic dysfunction. Last admission Lasix was discontinued secondary to worsening kidney disease and Demadex dose was reduced. She now has evidence of volume overload and her diuretics have been increased. We'll need to tolerate a higher serum creatinine.   Dysphagia: evaluated last hospitalization. On recommended diet  Code Status: full Family Communication: none present Disposition Plan: home when ready   Consultants:  nephrology  Procedures:  none  Antibiotics:  Rocephin 01/04/15>>  HPI/Subjective: No new complaints. No  worsening shortness of breath.  Objective: Filed Vitals:   01/06/15 1354  BP: 107/49  Pulse: 78  Temp: 98.8 F (37.1 C)  Resp: 18    Intake/Output Summary (Last 24 hours) at 01/06/15 1748 Last data filed at 01/06/15 1200  Gross per 24 hour  Intake    483 ml  Output    400 ml  Net     83 ml   Filed Weights   01/04/15 0559 01/05/15 0511 01/06/15 0612  Weight: 73.9 kg (162 lb 14.7 oz) 75.8 kg (167 lb 1.7 oz) 76 kg (167 lb 8.8 oz)    Exam:   General:  Obese appears comfortable calm  Cardiovascular: RRR no MGR trace-1+ LE edema particularly in thighs  Respiratory: normal effort BS somewhat distant and coarse. Fine crackles  Abdomen: obese soft +BS non-tender to palpation  Musculoskeletal: no clubbing or cyanosis   Data Reviewed: Basic Metabolic Panel:  Recent Labs Lab 01/02/15 1125 01/03/15 0555 01/04/15 0741 01/05/15 0549 01/06/15 0633  NA 149* 147* 144 142 143  K 4.3 4.5 3.8 4.3 4.3  CL 112 106 110 110 109  CO2 30 30 30 24 30   GLUCOSE 107* 205* 139* 129* 139*  BUN 67* 67* 65* 66* 65*  CREATININE 3.58* 3.43* 3.50* 3.69* 3.61*  CALCIUM 8.8 8.9 8.0* 7.8* 7.8*  PHOS  --   --   --  3.6  --    Liver Function Tests: No results for input(s): AST, ALT, ALKPHOS, BILITOT, PROT, ALBUMIN in the last 168 hours. No results for input(s): LIPASE, AMYLASE in the last 168 hours. No results for input(s): AMMONIA in the last 168 hours. CBC:  Recent Labs Lab 01/02/15  1125 01/03/15 0555 01/04/15 0741 01/05/15 0549 01/06/15 0633  WBC 5.2 10.1 8.4 6.3 5.2  NEUTROABS 3.3  --   --   --   --   HGB 9.3* 9.9* 8.2* 8.0* 7.9*  HCT 31.7* 34.3* 27.5* 27.2* 27.3*  MCV 82.8 84.7 82.8 82.2 83.0  PLT 166 190 153 171 191   Cardiac Enzymes:  Recent Labs Lab 01/02/15 1125 01/02/15 1707 01/02/15 2343  TROPONINI 0.13* 0.12* 0.10*   BNP (last 3 results)  Recent Labs  01/02/15 1125  BNP >4500.0*    ProBNP (last 3 results)  Recent Labs  03/15/14 1645 04/27/14 1536  06/09/14 1021  PROBNP 37134.0* 51631.0* 30358.0*    CBG:  Recent Labs Lab 01/05/15 2029 01/06/15 0606 01/06/15 0727 01/06/15 1107 01/06/15 1612  GLUCAP 179* 132* 144* 132* 166*    No results found for this or any previous visit (from the past 240 hour(s)).   Studies: No results found.  Scheduled Meds: . albuterol  2.5 mg Nebulization TID  . calcitRIOL  0.25 mcg Oral Daily  . cefTRIAXone (ROCEPHIN)  IV  1 g Intravenous Q24H  . enoxaparin (LOVENOX) injection  30 mg Subcutaneous Q24H  . folic acid  1 mg Oral q morning - 10a  . hydrALAZINE  75 mg Oral 3 times per day  . hydrocortisone  25 mg Rectal BID  . insulin aspart  0-15 Units Subcutaneous TID WC  . insulin aspart  0-5 Units Subcutaneous QHS  . insulin aspart  2 Units Subcutaneous TID WC  . insulin detemir  4 Units Subcutaneous QHS  . isosorbide mononitrate  15 mg Oral q morning - 10a  . levETIRAcetam  500 mg Oral BID  . metolazone  5 mg Oral BID  . metoprolol succinate  50 mg Oral q morning - 10a  . mupirocin ointment  1 application Nasal BID  . pantoprazole  40 mg Oral q morning - 10a  . pravastatin  20 mg Oral QHS  . senna  1 tablet Oral BID  . sodium bicarbonate  1,300 mg Oral TID  . sodium chloride  3 mL Intravenous Q12H  . torsemide  60 mg Oral Daily   Continuous Infusions:   Principal Problem:   Acute on chronic respiratory failure with hypoxia Active Problems:   CKD (chronic kidney disease) stage 4, GFR 15-29 ml/min   Diabetes   Iron deficiency anemia   Dementia   Cardiomyopathy, ischemic   Anemia   Hypernatremia   Elevated troponin   UTI (urinary tract infection)    Time spent: 35 minutes    [facility] Pager [phone]. If 7PM-7AM, please contact night-coverage at [email or web address], password [facility] 01/06/2015, 5:48 PM  LOS: 4 days

## 2015-01-06 NOTE — Progress Notes (Signed)
ANTIBIOTIC CONSULT NOTE  Pharmacy Consult for Rocephin Indication: UTI  No Known Allergies  Patient Measurements: Height: 5\' 6"  (167.6 cm) Weight: 167 lb 8.8 oz (76 kg) IBW/kg (Calculated) : 59.3  Vital Signs: Temp: 98.4 F (36.9 C) (02/13 0612) Temp Source: Oral (02/13 0612) BP: 129/59 mmHg (02/13 0612) Pulse Rate: 77 (02/13 0612) Intake/Output from previous day: 02/12 0701 - 02/13 0700 In: 483 [P.O.:480; I.V.:3] Out: 700 [Urine:700] Intake/Output from this shift: Total I/O In: 240 [P.O.:240] Out: -   Labs:  Recent Labs  01/04/15 0741 01/05/15 0549 01/06/15 0633  WBC 8.4 6.3 5.2  HGB 8.2* 8.0* 7.9*  PLT 153 171 191  CREATININE 3.50* 3.69* 3.61*   Estimated Creatinine Clearance: 14.5 mL/min (by C-G formula based on Cr of 3.61). No results for input(s): VANCOTROUGH, VANCOPEAK, VANCORANDOM, GENTTROUGH, GENTPEAK, GENTRANDOM, TOBRATROUGH, TOBRAPEAK, TOBRARND, AMIKACINPEAK, AMIKACINTROU, AMIKACIN in the last 72 hours.   Microbiology: No results found for this or any previous visit (from the past 720 hour(s)).   Anti-infectives    Start     Dose/Rate Route Frequency Ordered Stop   01/04/15 1000  cefTRIAXone (ROCEPHIN) 1 g in dextrose 5 % 50 mL IVPB - Premix     1 g 100 mL/hr over 30 Minutes Intravenous Every 24 hours 01/04/15 0737       Assessment: 74yo female with h/o CKD.  Estimated Creatinine Clearance: 14.5 mL/min (by C-G formula based on Cr of 3.61).  Asked to initiate Rocephin for UTI (no renal adjustment needed)  Goal of Therapy:  Eradicate infection.  Plan:  Continue Rocephin 1gm IV q24hrs Sign off.  [name] 01/06/2015,11:02 AM

## 2015-01-06 NOTE — Progress Notes (Signed)
Subjective: Interval History: Patient continued to deny any difficulty breathing. Recently her appetite is good.   Objective: Vital signs in last 24 hours: Temp:  [98.2 F (36.8 C)-98.8 F (37.1 C)] 98.4 F (36.9 C) (02/13 0612) Pulse Rate:  [77-95] 77 (02/13 0612) Resp:  [16-18] 18 (02/13 0612) BP: (128-134)/(59-66) 129/59 mmHg (02/13 0612) SpO2:  [97 %-100 %] 99 % (02/13 0653) Weight:  [76 kg (167 lb 8.8 oz)] 76 kg (167 lb 8.8 oz) (02/13 0612) Weight change: 0.2 kg (7.1 oz)  Intake/Output from previous day: 02/12 0701 - 02/13 0700 In: 483 [P.O.:480; I.V.:3] Out: 700 [Urine:700] Intake/Output this shift:    Patient is  awake and alert and in no apparent distress. Chest: Decreased breath sound no wheezing   her heart exam regular rate and rhythm Extremities 1+ edema  Lab Results:  Recent Labs  01/05/15 0549 01/06/15 0633  WBC 6.3 5.2  HGB 8.0* 7.9*  HCT 27.2* 27.3*  PLT 171 191   BMET:   Recent Labs  01/05/15 0549 01/06/15 0633  NA 142 143  K 4.3 4.3  CL 110 109  CO2 24 30  GLUCOSE 129* 139*  BUN 66* 65*  CREATININE 3.69* 3.61*  CALCIUM 7.8* 7.8*   No results for input(s): PTH in the last 72 hours. Iron Studies:   Recent Labs  01/05/15 0549  IRON 27*  TIBC 236*  FERRITIN 370*    Studies/Results: No results found.  I have reviewed the patient's current medications.  Assessment/Plan: Problem #1 acute kidney injury possibly secondary to prerenal syndrome. Her renal function continued to improve. Problem #2 hypernatremia: Her sodium has corrected. Problem #3 hypertension: Her blood pressure is well controlled Problem #4 chronic renal failure stage IV. Presently she is asymptomatic. Problem #5 diabetes. Problem 6 history of seizure disorder: No recent history of seizure activity. Problem #7 metabolic bone disease: Her calcium and her phosphorus is  in  Range Problem #8 anemia: Her hemoglobin and hematocrit has declined. Patient with history of  GI bleeding. Her iron saturation is low but her ferritin is reasonable. Patient presently does not have any GI bleeding.  Problem #9 hypokalemia most likely from diuretics. Her potassium has corrected.  Problem #10 CHF: Presently on Lasix with no significant urine output.  Plan: 1] We'll give her Demadex 60 mg by mouth daily 2] Metolazone 5 mg by mouth twice a day  3] We'll give her IV iron 1 dose today. 4] We'll check her basic metabolic panel in the morning.     LOS: 4 days   Linsey Hirota S 01/06/2015,8:15 AM

## 2015-01-07 DIAGNOSIS — I255 Ischemic cardiomyopathy: Secondary | ICD-10-CM

## 2015-01-07 LAB — GLUCOSE, CAPILLARY
GLUCOSE-CAPILLARY: 184 mg/dL — AB (ref 70–99)
Glucose-Capillary: 103 mg/dL — ABNORMAL HIGH (ref 70–99)
Glucose-Capillary: 188 mg/dL — ABNORMAL HIGH (ref 70–99)
Glucose-Capillary: 209 mg/dL — ABNORMAL HIGH (ref 70–99)

## 2015-01-07 LAB — CBC
HCT: 27.5 % — ABNORMAL LOW (ref 36.0–46.0)
HEMOGLOBIN: 8 g/dL — AB (ref 12.0–15.0)
MCH: 24.1 pg — ABNORMAL LOW (ref 26.0–34.0)
MCHC: 29.1 g/dL — ABNORMAL LOW (ref 30.0–36.0)
MCV: 82.8 fL (ref 78.0–100.0)
Platelets: 159 10*3/uL (ref 150–400)
RBC: 3.32 MIL/uL — ABNORMAL LOW (ref 3.87–5.11)
RDW: 25.7 % — ABNORMAL HIGH (ref 11.5–15.5)
WBC: 5.2 10*3/uL (ref 4.0–10.5)

## 2015-01-07 LAB — BASIC METABOLIC PANEL
Anion gap: 4 — ABNORMAL LOW (ref 5–15)
BUN: 70 mg/dL — ABNORMAL HIGH (ref 6–23)
CO2: 31 mmol/L (ref 19–32)
Calcium: 7.9 mg/dL — ABNORMAL LOW (ref 8.4–10.5)
Chloride: 110 mmol/L (ref 96–112)
Creatinine, Ser: 3.71 mg/dL — ABNORMAL HIGH (ref 0.50–1.10)
GFR calc Af Amer: 13 mL/min — ABNORMAL LOW (ref >90)
GFR, EST NON AFRICAN AMERICAN: 11 mL/min — AB (ref >90)
Glucose, Bld: 120 mg/dL — ABNORMAL HIGH (ref 70–99)
Potassium: 4 mmol/L (ref 3.5–5.1)
Sodium: 145 mmol/L (ref 135–145)

## 2015-01-07 NOTE — Progress Notes (Signed)
Subjective: Interval History: Patient continued deny any difficulty breathing or orthopnea. Patient also denies any nausea or vomiting.   Objective: Vital signs in last 24 hours: Temp:  [98.4 F (36.9 C)-98.8 F (37.1 C)] 98.5 F (36.9 C) (02/14 0538) Pulse Rate:  [78-101] 85 (02/14 0538) Resp:  [18-24] 24 (02/14 0538) BP: (107-151)/(49-70) 151/70 mmHg (02/14 0538) SpO2:  [95 %-98 %] 97 % (02/14 0655) Weight:  [75.7 kg (166 lb 14.2 oz)] 75.7 kg (166 lb 14.2 oz) (02/14 0428) Weight change: -0.3 kg (-10.6 oz)  Intake/Output from previous day: 02/13 0701 - 02/14 0700 In: 720 [P.O.:720] Out: 210 [Urine:210] Intake/Output this shift:    Patient is  awake and alert and in no apparent distress. Chest: Decreased breath sound no wheezing   her heart exam regular rate and rhythm Extremities 1+ edema  Lab Results:  Recent Labs  01/06/15 0633 01/07/15 0609  WBC 5.2 5.2  HGB 7.9* 8.0*  HCT 27.3* 27.5*  PLT 191 159   BMET:   Recent Labs  01/06/15 0633 01/07/15 0609  NA 143 145  K 4.3 4.0  CL 109 110  CO2 30 31  GLUCOSE 139* 120*  BUN 65* 70*  CREATININE 3.61* 3.71*  CALCIUM 7.8* 7.9*   No results for input(s): PTH in the last 72 hours. Iron Studies:   Recent Labs  01/05/15 0549  IRON 27*  TIBC 236*  FERRITIN 370*    Studies/Results: No results found.  I have reviewed the patient's current medications.  Assessment/Plan: Problem #1 acute kidney injury possibly secondary to prerenal syndrome. Her renal function remains stable. Possibly this is her baseline. Presently patient is asymptomatic. Problem #2 hypernatremia: Her sodium has corrected. Problem #3 hypertension: Her blood pressure is well controlled Problem #4 chronic renal failure stage IV. Presently she is asymptomatic. Problem #5 diabetes. Problem 6 history of seizure disorder: No recent history of seizure activity. Problem #7 metabolic bone disease: Her calcium and her phosphorus is  in   Range Problem #8 anemia: Her hemoglobin and hematocrit has declined. Patient with history of GI bleeding. Patient has received IV iron yesterday..  Problem #9 hypokalemia most likely from diuretics. Her potassium has corrected.  Problem #10 CHF: Presently on Demadex and metolazone. Patient is nonoliguric. Her weight is also stable. Plan: 1] We'll continue his present management. 2] at this moment patient doesn't require dialysis. However patient may need to have an access placed and possibly as an outpatient. 3] we'll check her basic metabolic panel in the morning. 3] We'll give her IV iron 1 dose today. 4] We'll check her basic metabolic panel in the morning.     LOS: 5 days   [name] S 01/07/2015,7:38 AM

## 2015-01-07 NOTE — Progress Notes (Signed)
[facility] PROGRESS NOTE  [name] IRW:[id number] DOB: 15-Nov-1941 DOA: 01/02/2015 PCP: [name], MD  Assessment/Plan: Acute on chronic respiratory failure with hypoxia: Possibly related to aspiration vs. Volume overload. Back to baseline. VQ scan with low risk. S/p lasix IV. Hx aspiration/dysphagia and on dysphagia diet.    Active Problems: Hypernatremia: resolved.Iv fluids discontinued.    Elevated troponin: No report of chest pain. Trended downward. Vital signs are stable.   UTI: afebrile and non-toxic appearing. Continue  Rocephin day #4. Will discontinue after tomorrow's dose.   CKD (chronic kidney disease) stage 4, GFR 15-29 ml/min: Creatinine continues to trend up slightly. Started on demadex and metolazone.  Nephrology following.    Diabetes: Continue Levemir at half her home dose for now.  A1c 7.52  Will continue car modified diet. Appetite improving.  Will monitor   Iron deficiency anemia: As well as anemia of chronic disease. Baseline seems to be between 9-10. 3 weeks ago Hg 11.0 Review indicates she is followed by hematology and gets Aranesp every 2 weeks. Dose due 01/04/15 and was given. In additition she gets IV iron occasionally. Continue to follow. 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 Last echo in July 2015 showed an EF of 76% grade 2 diastolic dysfunction. Last admission Lasix was discontinued secondary to worsening kidney disease and Demadex dose was reduced. She now has evidence of volume overload and her diuretics have been increased. We'll need to tolerate a higher serum creatinine. Continue to monitor volume status  Dysphagia: evaluated last hospitalization. On recommended diet  Code Status: full Family Communication: none present Disposition Plan: home when  ready   Consultants:  nephrology  Procedures:  none  Antibiotics:  Rocephin 01/04/15>>  HPI/Subjective: No new complaints. No worsening shortness of breath.  Objective: Filed Vitals:   01/07/15 1352  BP: 122/50  Pulse: 82  Temp: 99 F (37.2 C)  Resp: 20    Intake/Output Summary (Last 24 hours) at 01/07/15 1822 Last data filed at 01/07/15 1200  Gross per 24 hour  Intake    480 ml  Output    210 ml  Net    270 ml   Filed Weights   01/05/15 0511 01/06/15 0612 01/07/15 0428  Weight: 75.8 kg (167 lb 1.7 oz) 76 kg (167 lb 8.8 oz) 75.7 kg (166 lb 14.2 oz)    Exam:   General:  Obese appears comfortable calm  Cardiovascular: RRR no MGR trace-1+ LE edema particularly in thighs  Respiratory: normal effort BS somewhat distant and coarse. Fine crackles  Abdomen: obese soft +BS non-tender to palpation  Musculoskeletal: no clubbing or cyanosis   Data Reviewed: Basic Metabolic Panel:  Recent Labs Lab 01/03/15 0555 01/04/15 0741 01/05/15 0549 01/06/15 0633 01/07/15 0609  NA 147* 144 142 143 145  K 4.5 3.8 4.3 4.3 4.0  CL 106 110 110 109 110  CO2 30 30 24 30 31   GLUCOSE 205* 139* 129* 139* 120*  BUN 67* 65* 66* 65* 70*  CREATININE 3.43* 3.50* 3.69* 3.61* 3.71*  CALCIUM 8.9 8.0* 7.8* 7.8* 7.9*  PHOS  --   --  3.6  --   --    Liver Function Tests: No results for input(s): AST, ALT, ALKPHOS, BILITOT, PROT, ALBUMIN in the last 168 hours. No results for input(s): LIPASE, AMYLASE in the last 168 hours. No results for input(s): AMMONIA in the last 168  hours. CBC:  Recent Labs Lab 01/02/15 1125 01/03/15 0555 01/04/15 0741 01/05/15 0549 01/06/15 0633 01/07/15 0609  WBC 5.2 10.1 8.4 6.3 5.2 5.2  NEUTROABS 3.3  --   --   --   --   --   HGB 9.3* 9.9* 8.2* 8.0* 7.9* 8.0*  HCT 31.7* 34.3* 27.5* 27.2* 27.3* 27.5*  MCV 82.8 84.7 82.8 82.2 83.0 82.8  PLT 166 190 153 171 191 159   Cardiac Enzymes:  Recent Labs Lab 01/02/15 1125 01/02/15 1707  01/02/15 2343  TROPONINI 0.13* 0.12* 0.10*   BNP (last 3 results)  Recent Labs  01/02/15 1125  BNP >4500.0*    ProBNP (last 3 results)  Recent Labs  03/15/14 1645 04/27/14 1536 06/09/14 1021  PROBNP 37134.0* 51631.0* 30358.0*    CBG:  Recent Labs Lab 01/06/15 1612 01/06/15 2140 01/07/15 0743 01/07/15 1131 01/07/15 1648  GLUCAP 166* 171* 103* 188* 184*    No results found for this or any previous visit (from the past 240 hour(s)).   Studies: No results found.  Scheduled Meds: . albuterol  2.5 mg Nebulization TID  . calcitRIOL  0.25 mcg Oral Daily  . cefTRIAXone (ROCEPHIN)  IV  1 g Intravenous Q24H  . enoxaparin (LOVENOX) injection  30 mg Subcutaneous Q24H  . folic acid  1 mg Oral q morning - 10a  . hydrALAZINE  75 mg Oral 3 times per day  . hydrocortisone  25 mg Rectal BID  . insulin aspart  0-15 Units Subcutaneous TID WC  . insulin aspart  0-5 Units Subcutaneous QHS  . insulin aspart  2 Units Subcutaneous TID WC  . insulin detemir  4 Units Subcutaneous QHS  . isosorbide mononitrate  15 mg Oral q morning - 10a  . levETIRAcetam  500 mg Oral BID  . metolazone  5 mg Oral BID  . metoprolol succinate  50 mg Oral q morning - 10a  . mupirocin ointment  1 application Nasal BID  . pantoprazole  40 mg Oral q morning - 10a  . pravastatin  20 mg Oral QHS  . senna  1 tablet Oral BID  . sodium bicarbonate  1,300 mg Oral TID  . sodium chloride  3 mL Intravenous Q12H  . torsemide  60 mg Oral Daily   Continuous Infusions:   Principal Problem:   Acute on chronic respiratory failure with hypoxia Active Problems:   CKD (chronic kidney disease) stage 4, GFR 15-29 ml/min   Diabetes   Iron deficiency anemia   Dementia   Cardiomyopathy, ischemic   Anemia   Hypernatremia   Elevated troponin   UTI (urinary tract infection)    Time spent: 35 minutes    [facility] Pager [phone]. If 7PM-7AM, please contact night-coverage at  [email or web address], password [facility] 01/07/2015, 6:22 PM  LOS: 5 days

## 2015-01-08 ENCOUNTER — Other Ambulatory Visit (HOSPITAL_COMMUNITY): Payer: Medicare Other

## 2015-01-08 ENCOUNTER — Ambulatory Visit (HOSPITAL_COMMUNITY): Payer: Medicare Other

## 2015-01-08 ENCOUNTER — Encounter (HOSPITAL_COMMUNITY): Payer: Medicare Other

## 2015-01-08 ENCOUNTER — Ambulatory Visit (HOSPITAL_COMMUNITY): Payer: Medicare Other | Admitting: Oncology

## 2015-01-08 LAB — BASIC METABOLIC PANEL
Anion gap: 7 (ref 5–15)
BUN: 73 mg/dL — ABNORMAL HIGH (ref 6–23)
CO2: 33 mmol/L — AB (ref 19–32)
Calcium: 8.3 mg/dL — ABNORMAL LOW (ref 8.4–10.5)
Chloride: 109 mmol/L (ref 96–112)
Creatinine, Ser: 3.74 mg/dL — ABNORMAL HIGH (ref 0.50–1.10)
GFR calc Af Amer: 13 mL/min — ABNORMAL LOW (ref >90)
GFR calc non Af Amer: 11 mL/min — ABNORMAL LOW (ref >90)
GLUCOSE: 138 mg/dL — AB (ref 70–99)
Potassium: 4.1 mmol/L (ref 3.5–5.1)
Sodium: 149 mmol/L — ABNORMAL HIGH (ref 135–145)

## 2015-01-08 LAB — CBC
HCT: 26.2 % — ABNORMAL LOW (ref 36.0–46.0)
HEMOGLOBIN: 7.7 g/dL — AB (ref 12.0–15.0)
MCH: 24.3 pg — ABNORMAL LOW (ref 26.0–34.0)
MCHC: 29.4 g/dL — ABNORMAL LOW (ref 30.0–36.0)
MCV: 82.6 fL (ref 78.0–100.0)
Platelets: 192 10*3/uL (ref 150–400)
RBC: 3.17 MIL/uL — AB (ref 3.87–5.11)
RDW: 25.4 % — AB (ref 11.5–15.5)
WBC: 5.6 10*3/uL (ref 4.0–10.5)

## 2015-01-08 LAB — PREPARE RBC (CROSSMATCH)

## 2015-01-08 LAB — GLUCOSE, CAPILLARY
GLUCOSE-CAPILLARY: 140 mg/dL — AB (ref 70–99)
GLUCOSE-CAPILLARY: 210 mg/dL — AB (ref 70–99)
Glucose-Capillary: 142 mg/dL — ABNORMAL HIGH (ref 70–99)
Glucose-Capillary: 143 mg/dL — ABNORMAL HIGH (ref 70–99)

## 2015-01-08 MED ORDER — INSULIN ASPART 100 UNIT/ML ~~LOC~~ SOLN
3.0000 [IU] | Freq: Three times a day (TID) | SUBCUTANEOUS | Status: DC
  Administered 2015-01-08 – 2015-01-09 (×4): 3 [IU] via SUBCUTANEOUS

## 2015-01-08 MED ORDER — DEXTROSE 5 % IV SOLN
INTRAVENOUS | Status: DC
  Administered 2015-01-08: 10:00:00 via INTRAVENOUS

## 2015-01-08 MED ORDER — SODIUM CHLORIDE 0.9 % IV SOLN
Freq: Once | INTRAVENOUS | Status: AC
  Administered 2015-01-08: 22:00:00 via INTRAVENOUS

## 2015-01-08 NOTE — Progress Notes (Signed)
Subjective: Interval History: Patient offers no complaint. No difficulty in breathing.   Objective: Vital signs in last 24 hours: Temp:  [98.7 F (37.1 C)-99.3 F (37.4 C)] 99.3 F (37.4 C) (02/15 0536) Pulse Rate:  [82-101] 84 (02/15 0536) Resp:  [20] 20 (02/15 0536) BP: (118-144)/(44-77) 128/52 mmHg (02/15 0536) SpO2:  [89 %-100 %] 93 % (02/15 0702) Weight:  [75.7 kg (166 lb 14.2 oz)] 75.7 kg (166 lb 14.2 oz) (02/15 0536) Weight change: 0 kg (0 lb)  Intake/Output from previous day: 02/14 0701 - 02/15 0700 In: 720 [P.O.:720] Out: 750 [Urine:750] Intake/Output this shift:    Patient is  awake and alert and in no apparent distress. Chest: Decreased breath sound no wheezing   her heart exam regular rate and rhythm Extremities 1+ edema  Lab Results:  Recent Labs  01/07/15 0609 01/08/15 0555  WBC 5.2 5.6  HGB 8.0* 7.7*  HCT 27.5* 26.2*  PLT 159 192   BMET:   Recent Labs  01/07/15 0609 01/08/15 0555  NA 145 149*  K 4.0 4.1  CL 110 109  CO2 31 33*  GLUCOSE 120* 138*  BUN 70* 73*  CREATININE 3.71* 3.74*  CALCIUM 7.9* 8.3*   No results for input(s): PTH in the last 72 hours. Iron Studies:  No results for input(s): IRON, TIBC, TRANSFERRIN, FERRITIN in the last 72 hours.  Studies/Results: No results found.  I have reviewed the patient's current medications.  Assessment/Plan: Problem #1 acute kidney injury possibly secondary to prerenal syndrome. No significant change with her kidney function. Problem #2 hypernatremia: Her sodium has increased slightly. Problem #3 hypertension: Her blood pressure is well controlled Problem #4 chronic renal failure stage IV. Presently she is asymptomatic. Problem #5 diabetes. Problem 6 history of seizure disorder: No recent history of seizure activity. Problem #7 metabolic bone disease: Her calcium and her phosphorus is  in  Range Problem #8 anemia: Her hemoglobin and hematocrit has declined. Patient with history of GI  bleeding. Her hemoglobin and hematocrit is declining.  Problem #9 hypokalemia most likely from diuretics. Her potassium has corrected.  Problem #10 CHF: Presently on Demadex and metolazone. Patient is nonoliguric. Her weight is also stable. Plan: 1] We'll encourage patient to increase her free water intake 2] We'll check her basic metabolic panel in the morning.  3] D5 water at 50 mL per hour    LOS: 6 days   Milia Warth S 01/08/2015,8:37 AM

## 2015-01-08 NOTE — Progress Notes (Signed)
[facility] PROGRESS NOTE  [name] LGX:[id number] DOB: 08/15/1941 DOA: 01/02/2015 PCP: [name], MD  Assessment/Plan:  Acute on chronic respiratory failure with hypoxia: Possibly related to aspiration vs. Volume overload. Remains at baseline. VQ scan with low risk. S/p lasix IV. Hx aspiration/dysphagia and on dysphagia diet.demadex increased and metolazone added.    Active Problems: Hypernatremia: trending up. Gentle IV fluids per nephrology. Encourage free water intake. Recheck in am.     Elevated troponin: No report of chest pain. Trended downward. Vital signs are stable.   UTI: afebrile and non-toxic appearing. Completed 5 days rocephin.  Will discontinue.   CKD (chronic kidney disease) stage 4, GFR 15-29 ml/min: Creatinine continues to trend up slightly. Started on demadex and metolazone. Nephrology following.    Diabetes: Continue Levemir at half her home dose for now. A1c 7.52. CBG range 132-209. Will continue car modified diet. Increase meal coverage. Monitor   Iron deficiency anemia:  As well as anemia of chronic disease. Hg trending down slightly Baseline seems to be between 9-10. 3 weeks ago Hg 11.0 Review indicates she is followed by hematology and gets Aranesp every 2 weeks. Dose due 01/04/15 and was given. In additition she gets IV iron occasionally. Continue to follow. 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 Last echo in July 2015 showed an EF of 81% grade 2 diastolic dysfunction. Last admission Lasix was discontinued secondary to worsening kidney disease and Demadex dose was reduced. Continues with persistent edema thighs and lower legs but improving. demadex dose increased and metolazone added. Creatinine trending up slightly.  We'll need to tolerate a higher serum creatinine. monitor  Dysphagia: evaluated last hospitalization. On  recommended diet  Code Status: full Family Communication: daughter [name] on phone Disposition Plan: home tomorrow   Consultants:  nephrology  Procedures:  none  Antibiotics:  Rocephin 01/04/15-01/08/15  HPI/Subjective: Alert. Reports feeling "good" this morning.   Objective: Filed Vitals:   01/08/15 0536  BP: 128/52  Pulse: 84  Temp: 99.3 F (37.4 C)  Resp: 20    Intake/Output Summary (Last 24 hours) at 01/08/15 0932 Last data filed at 01/08/15 0840  Gross per 24 hour  Intake    483 ml  Output    750 ml  Net   -267 ml   Filed Weights   01/06/15 0612 01/07/15 0428 01/08/15 0536  Weight: 76 kg (167 lb 8.8 oz) 75.7 kg (166 lb 14.2 oz) 75.7 kg (166 lb 14.2 oz)    Exam:   General:  Obese appears comfortable  Cardiovascular: RRR no m/g/r trace-1+ LE edema to thighs  Respiratory: normal effort BS distant no wheeze  Abdomen: obese soft BS non-tender to palpation  Musculoskeletal: no clubbing or cyanosis   Data Reviewed: Basic Metabolic Panel:  Recent Labs Lab 01/04/15 0741 01/05/15 0549 01/06/15 0633 01/07/15 0609 01/08/15 0555  NA 144 142 143 145 149*  K 3.8 4.3 4.3 4.0 4.1  CL 110 110 109 110 109  CO2 30 24 30 31  33*  GLUCOSE 139* 129* 139* 120* 138*  BUN 65* 66* 65* 70* 73*  CREATININE 3.50* 3.69* 3.61* 3.71* 3.74*  CALCIUM 8.0* 7.8* 7.8* 7.9* 8.3*  PHOS  --  3.6  --   --   --    Liver Function Tests: No results for input(s): AST, ALT, ALKPHOS, BILITOT, PROT, ALBUMIN in the last 168 hours. No results for input(s):  LIPASE, AMYLASE in the last 168 hours. No results for input(s): AMMONIA in the last 168 hours. CBC:  Recent Labs Lab 01/02/15 1125  01/04/15 0741 01/05/15 0549 01/06/15 0633 01/07/15 0609 01/08/15 0555  WBC 5.2  < > 8.4 6.3 5.2 5.2 5.6  NEUTROABS 3.3  --   --   --   --   --   --   HGB 9.3*  < > 8.2* 8.0* 7.9* 8.0* 7.7*  HCT 31.7*  < > 27.5* 27.2* 27.3* 27.5* 26.2*  MCV 82.8  < > 82.8 82.2 83.0 82.8 82.6  PLT 166  < >  153 171 191 159 192  < > = values in this interval not displayed. Cardiac Enzymes:  Recent Labs Lab 01/02/15 1125 01/02/15 1707 01/02/15 2343  TROPONINI 0.13* 0.12* 0.10*   BNP (last 3 results)  Recent Labs  01/02/15 1125  BNP >4500.0*    ProBNP (last 3 results)  Recent Labs  03/15/14 1645 04/27/14 1536 06/09/14 1021  PROBNP 37134.0* 51631.0* 30358.0*    CBG:  Recent Labs Lab 01/07/15 0743 01/07/15 1131 01/07/15 1648 01/07/15 2129 01/08/15 0717  GLUCAP 103* 188* 184* 209* 142*    No results found for this or any previous visit (from the past 240 hour(s)).   Studies: No results found.  Scheduled Meds: . albuterol  2.5 mg Nebulization TID  . calcitRIOL  0.25 mcg Oral Daily  . cefTRIAXone (ROCEPHIN)  IV  1 g Intravenous Q24H  . enoxaparin (LOVENOX) injection  30 mg Subcutaneous Q24H  . folic acid  1 mg Oral q morning - 10a  . hydrALAZINE  75 mg Oral 3 times per day  . hydrocortisone  25 mg Rectal BID  . insulin aspart  0-15 Units Subcutaneous TID WC  . insulin aspart  0-5 Units Subcutaneous QHS  . insulin aspart  2 Units Subcutaneous TID WC  . insulin detemir  4 Units Subcutaneous QHS  . isosorbide mononitrate  15 mg Oral q morning - 10a  . levETIRAcetam  500 mg Oral BID  . metolazone  5 mg Oral BID  . metoprolol succinate  50 mg Oral q morning - 10a  . mupirocin ointment  1 application Nasal BID  . pantoprazole  40 mg Oral q morning - 10a  . pravastatin  20 mg Oral QHS  . senna  1 tablet Oral BID  . sodium bicarbonate  1,300 mg Oral TID  . sodium chloride  3 mL Intravenous Q12H  . torsemide  60 mg Oral Daily   Continuous Infusions: . dextrose      Principal Problem:   Acute on chronic respiratory failure with hypoxia Active Problems:   CKD (chronic kidney disease) stage 4, GFR 15-29 ml/min   Diabetes   Iron deficiency anemia   Dementia   Cardiomyopathy, ischemic   Anemia   Hypernatremia   Elevated troponin   UTI (urinary tract  infection)    Time spent: [facility] Pager [phone]. If 7PM-7AM, please contact night-coverage at [email or web address], password [facility] 01/08/2015, 9:32 AM  LOS: 6 days

## 2015-01-09 LAB — BASIC METABOLIC PANEL
ANION GAP: 9 (ref 5–15)
BUN: 72 mg/dL — ABNORMAL HIGH (ref 6–23)
CALCIUM: 8.6 mg/dL (ref 8.4–10.5)
CO2: 32 mmol/L (ref 19–32)
CREATININE: 3.63 mg/dL — AB (ref 0.50–1.10)
Chloride: 107 mmol/L (ref 96–112)
GFR calc non Af Amer: 11 mL/min — ABNORMAL LOW (ref >90)
GFR, EST AFRICAN AMERICAN: 13 mL/min — AB (ref >90)
Glucose, Bld: 142 mg/dL — ABNORMAL HIGH (ref 70–99)
Potassium: 4.1 mmol/L (ref 3.5–5.1)
SODIUM: 148 mmol/L — AB (ref 135–145)

## 2015-01-09 LAB — TYPE AND SCREEN
ABO/RH(D): A POS
ANTIBODY SCREEN: NEGATIVE
Unit division: 0

## 2015-01-09 LAB — GLUCOSE, CAPILLARY
GLUCOSE-CAPILLARY: 133 mg/dL — AB (ref 70–99)
GLUCOSE-CAPILLARY: 180 mg/dL — AB (ref 70–99)
GLUCOSE-CAPILLARY: 152 mg/dL — AB (ref 70–99)
Glucose-Capillary: 155 mg/dL — ABNORMAL HIGH (ref 70–99)

## 2015-01-09 LAB — CBC
HCT: 31.2 % — ABNORMAL LOW (ref 36.0–46.0)
HEMOGLOBIN: 9.3 g/dL — AB (ref 12.0–15.0)
MCH: 24.5 pg — AB (ref 26.0–34.0)
MCHC: 29.8 g/dL — ABNORMAL LOW (ref 30.0–36.0)
MCV: 82.3 fL (ref 78.0–100.0)
Platelets: 201 10*3/uL (ref 150–400)
RBC: 3.79 MIL/uL — ABNORMAL LOW (ref 3.87–5.11)
RDW: 23.7 % — ABNORMAL HIGH (ref 11.5–15.5)
WBC: 5.6 10*3/uL (ref 4.0–10.5)

## 2015-01-09 MED ORDER — RESOURCE THICKENUP CLEAR PO POWD: 1.0000 | ORAL | Status: AC | PRN

## 2015-01-09 MED ORDER — ISOSORBIDE MONONITRATE ER 30 MG PO TB24: 15.0000 mg | ORAL_TABLET | Freq: Every morning | ORAL | Status: DC

## 2015-01-09 MED ORDER — HYDROCORTISONE ACETATE 25 MG RE SUPP: 25.0000 mg | Freq: Two times a day (BID) | RECTAL | Status: DC

## 2015-01-09 MED ORDER — TORSEMIDE 20 MG PO TABS: 60.0000 mg | ORAL_TABLET | Freq: Every day | ORAL | Status: DC

## 2015-01-09 MED ORDER — ISOSORBIDE MONONITRATE ER 30 MG PO TB24: 15.0000 mg | ORAL_TABLET | Freq: Every morning | ORAL | Status: AC

## 2015-01-09 MED ORDER — METOLAZONE 5 MG PO TABS: 5.0000 mg | ORAL_TABLET | Freq: Two times a day (BID) | ORAL | Status: DC

## 2015-01-09 NOTE — Progress Notes (Signed)
[name]  MRN: [id number]  DOB/AGE: 74-Feb-1942 74 y.o.  Primary Care Physician:HILL,GERALD K, MD  Admit date: 01/02/2015  Chief Complaint:  Chief Complaint  Patient presents with  . Shortness of Breath    S-Pt presented on  01/02/2015 with  Chief Complaint  Patient presents with  . Shortness of Breath  .    Pt offers no complaints. Py laying comfortably.   Meds . albuterol  2.5 mg Nebulization TID  . calcitRIOL  0.25 mcg Oral Daily  . enoxaparin (LOVENOX) injection  30 mg Subcutaneous Q24H  . folic acid  1 mg Oral q morning - 10a  . hydrALAZINE  75 mg Oral 3 times per day  . hydrocortisone  25 mg Rectal BID  . insulin aspart  0-15 Units Subcutaneous TID WC  . insulin aspart  0-5 Units Subcutaneous QHS  . insulin aspart  3 Units Subcutaneous TID WC  . insulin detemir  4 Units Subcutaneous QHS  . isosorbide mononitrate  15 mg Oral q morning - 10a  . levETIRAcetam  500 mg Oral BID  . metolazone  5 mg Oral BID  . metoprolol succinate  50 mg Oral q morning - 10a  . mupirocin ointment  1 application Nasal BID  . pantoprazole  40 mg Oral q morning - 10a  . pravastatin  20 mg Oral QHS  . senna  1 tablet Oral BID  . sodium bicarbonate  1,300 mg Oral TID  . sodium chloride  3 mL Intravenous Q12H  . torsemide  60 mg Oral Daily      Physical Exam: Vital signs in last 24 hours: Temp:  [98.5 F (36.9 C)-99.6 F (37.6 C)] 98.6 F (37 C) (02/16 0616) Pulse Rate:  [85-88] 88 (02/16 0616) Resp:  [18-20] 18 (02/16 0616) BP: (116-145)/(54-66) 132/66 mmHg (02/16 0616) SpO2:  [86 %-100 %] 89 % (02/16 1306) Weight:  [167 lb 12.3 oz (76.1 kg)] 167 lb 12.3 oz (76.1 kg) (02/16 0616) Weight change: 14.1 oz (0.4 kg) Last BM Date: 01/08/15  Intake/Output from previous day: 02/15 0701 - 02/16 0700 In: 1121.3 [P.O.:120; I.V.:666.3; Blood:335] Out: 2000 [Urine:2000] Total I/O In: 360 [P.O.:360] Out: -    Physical Exam: General- pt is awake,lying comfortably in bed Resp- No  acute REsp distress, Decreased bs at bases. CVS- S1S2 regular in rate and rhythm GIT- BS+, soft, NT, ND EXT- 1+ LE Edema, No Cyanosis   Lab Results: CBC  Recent Labs  01/08/15 0555 01/09/15 0543  WBC 5.6 5.6  HGB 7.7* 9.3*  HCT 26.2* 31.2*  PLT 192 201    BMET  Recent Labs  01/08/15 0555 01/09/15 0543  NA 149* 148*  K 4.1 4.1  CL 109 107  CO2 33* 32  GLUCOSE 138* 142*  BUN 73* 72*  CREATININE 3.74* 3.63*  CALCIUM 8.3* 8.6   Trend Creat  2016 3.43=>3.74=>3.63  3.58=>3.43( Feb admission) 4.29=>3.64=>4.10=>3.95 ( Jan admission) 2015 2.0--3.75( Multiple admission) 2014 1.4--2.5 20131.5--1.7 20121.26  Sodium 149=>149   MICRO No results found for this or any previous visit (from the past 240 hour(s)).    Lab Results  Component Value Date   PTH 268.4* 04/28/2014   CALCIUM 8.6 01/09/2015   PHOS 3.6 01/05/2015         Impression: 1)Renal AKI on CKD                AKI sec to Prerenal/ATN  AKI better                CKD stage 4 .        CKD since 2013  CKD secondary to DM/HTN/Age associated decline  Progression of CKD marked with multiple AKI in 2015 and now in 2016  Proteinuria Present in U/a  2)HTN  Medication- On Diuretics. On Beta blockers On Vasodilators  3)Anemia HGb at goal (9--11) On ESA  4)CKD Mineral-Bone Disorder PTH elevated. Secondary Hyperparathyroidism present. Phosphorus at goal.    5)Electrolytes Normokalemic Hypernatremic improving  6)Acid base Co2 at goal            Plan:  Will continue current care     [facility] S 01/09/2015, 2:27 PM

## 2015-01-09 NOTE — Consult Note (Signed)
Patient evaluated for [place] [facility] services. Spoke with inpatient RNCM who instructed writer to contact patient's daughter [name] regarding services at [phone]. Spoke with [name], whom patient lives with to offer [place] [facility] services again. [name] gave verbal consent for [place] [facility] services. Made her aware that [facility] will not interfere or replace home health services that have been arranged. Patient will receive post hospital discharge call and will be evaluated for monthly home visits.  Kimberly Rolling, MSN- RN, [facility] [phone]

## 2015-01-09 NOTE — Progress Notes (Signed)
Called and spoke with daughter, [name] [phone]).  EMS had emergency which set them back to pick up pt for d/c home.  Daughter is aware and we will update if needed.

## 2015-01-09 NOTE — Progress Notes (Signed)
[facility] arrived to transport patient home.  Pt's daughter [name] called via phone and notified that patient was being transported home.  [name] was updated on last medication's given and last CBG of 152.  Vicky informed that I would send a updated copy of her discharge papers listing the last doses given of her medications and that this would be the only updated information.  Vicky verbalizes understanding via phone.  Patient left department on stretcher in care of [place] with oxygen via nasal cannula.  No distress was noted and patient denies complaint.  IV access had been removed on previous shift.

## 2015-01-09 NOTE — Progress Notes (Signed)
Report given to oncoming nurse. Pt's foley and iv needs to be removed before discharge. Pt currently stable.

## 2015-01-09 NOTE — Discharge Summary (Signed)
Physician Discharge Summary  [name] MWN:[id number] DOB: 1941/10/23 DOA: 01/02/2015  PCP: [name], MD  Admit date: 01/02/2015 Discharge date: 01/09/2015  Time spent: 40 minutes  Recommendations for Outpatient Follow-up:  1. Dr Hinda Lenis in 4 weeks follow to hospitalization.  2. Home health 3. PCP 1 week. Recommend CBC and BMET to track hg and electrolytes  Discharge Diagnoses:  Principal Problem:   Acute on chronic respiratory failure with hypoxia Active Problems:   CKD (chronic kidney disease) stage 4, GFR 15-29 ml/min   Diabetes   Iron deficiency anemia   Dementia   Cardiomyopathy, ischemic   Anemia   Hypernatremia   Elevated troponin   UTI (urinary tract infection)   Discharge Condition: stable  Diet recommendation: dysphagia 3  Filed Weights   01/07/15 0428 01/08/15 0536 01/09/15 0616  Weight: 75.7 kg (166 lb 14.2 oz) 75.7 kg (166 lb 14.2 oz) 76.1 kg (167 lb 12.3 oz)    History of present illness:  The patient is a 74 year old woman with a history of CAD, chronic systolic heart failure, stage IV chronic kidney disease, dementia, diabetes mellitus with nephropathy, and chronic respiratory failure on home oxygen. She presented to the ED 01/02/15 after her home health nurse found that her oxygen saturation was in the 80s. The patient was just discharged from the hospital 2 days prior for treatment of dehydration and acute on chronic renal failure. Her oxygen saturations were in the upper 90s on 2 L of nasal cannula oxygen in the ED. Discussed with nephrologist Dr. Theador Hawthorne who agreed with restarting the patient's oral diuretic and giving her free water. The patient was status post 20 mg of IV Lasix in the ED with good response. The patient's troponin I was elevated, but she had no complaints of chest pain.   Of note, her VQ scan was low probability for PE.  Her daughter reported that the patient  scheduled for an endoscopic ultrasound in March at [facility] Course:  Acute on chronic respiratory failure with hypoxia: Possibly related to aspiration vs. Volume overload. Admitted and diuresed with IV lasix. Transitioned to metolazone and demadex increased.  At baseline at discharge. VQ scan with low risk.  Hx aspiration/dysphagia and on dysphagia diet.follow up with Dr Hinda Lenis in 4 weeks   Active Problems: Hypernatremia: hx of same. Mild.  Trending down at discharge.  Encourage free water intake. Recommend BMET 1 week to track    Elevated troponin: No report of chest pain. Trended downward.    UTI: afebrile and non-toxic appearing. Completed 5 days rocephin.   CKD (chronic kidney disease) stage 4, GFR 15-29 ml/min: stable. Started on demadex and metolazone. Evaluated by  Nephrology. Follow up with nephrology 4 weeks   Diabetes:  A1c 7.52. CBG range 132-250. Resume home regimen    Iron deficiency anemia: As well as anemia of chronic disease. S/P 1 unit PRBC's. Hg 9.2 Review indicates she is followed by hematology and gets Aranesp every 2 weeks. Dose due 01/04/15 and was given. In additition she gets IV iron occasionally. No evidence of bleeding at this time   Dementia: Stable at baseline   Cardiomyopathy, ischemic/ combined systolic and diastolic heart failure. Last echo in July 2015 showed an EF of 40% grade 2 diastolic dysfunction. Demadex dose increased and metolazone added. Creatinine trending up slightly. We'll need to tolerate a higher serum creatinine.   Dysphagia: evaluated last hospitalization. On recommended diet   Procedures:  none  Consultations:  nephrology  Discharge Exam: Filed Vitals:   01/09/15 0616  BP: 132/66  Pulse: 88  Temp: 98.6 F (37 C)  Resp: 18    General: obese appears comfortable Cardiovascular: RRR no MGR trace LE edema Respiratory: normal effort BS somewhat distant but clear. No wheeze very fine crackles  Discharge Instructions    Current Discharge Medication  List    START taking these medications   Details  Maltodextrin-Xanthan Gum (RESOURCE THICKENUP CLEAR) POWD Take 120 g by mouth as needed.    metolazone (ZAROXOLYN) 5 MG tablet Take 1 tablet (5 mg total) by mouth 2 (two) times daily. Qty: 60 tablet, Refills: 0      CONTINUE these medications which have CHANGED   Details  hydrocortisone (ANUSOL-HC) 25 MG suppository Place 1 suppository (25 mg total) rectally 2 (two) times daily. Qty: 12 suppository, Refills: 0    isosorbide mononitrate (IMDUR) 30 MG 24 hr tablet Take 0.5 tablets (15 mg total) by mouth every morning. Qty: 30 tablet, Refills: 0    torsemide (DEMADEX) 20 MG tablet Take 3 tablets (60 mg total) by mouth daily. Qty: 90 tablet, Refills: 0      CONTINUE these medications which have NOT CHANGED   Details  calcitRIOL (ROCALTROL) 0.25 MCG capsule Take 1 capsule (0.25 mcg total) by mouth daily. Qty: 31 capsule, Refills: 0    donepezil (ARICEPT) 10 MG tablet Take 10 mg by mouth at bedtime.     folic acid (FOLVITE) 1 MG tablet Take 1 mg by mouth every morning.     insulin detemir (LEVEMIR) 100 UNIT/ML injection Inject 0.08 mLs (8 Units total) into the skin at bedtime. Qty: 10 mL, Refills: 0    levETIRAcetam (KEPPRA) 100 MG/ML solution Take 500 mg by mouth 2 (two) times daily.    linagliptin (TRADJENTA) 5 MG TABS tablet Take 5 mg by mouth every morning.     metoprolol succinate (TOPROL-XL) 100 MG 24 hr tablet Take 50 mg by mouth every morning. Take with or immediately following a meal.    pantoprazole (PROTONIX) 40 MG tablet Take 40 mg by mouth every morning.    pravastatin (PRAVACHOL) 20 MG tablet Take 20 mg by mouth at bedtime.     sodium bicarbonate 650 MG tablet Take 1,300 mg by mouth 3 (three) times daily.     hydrALAZINE (APRESOLINE) 25 MG tablet Take 3 tablets (75 mg total) by mouth every 8 (eight) hours.      STOP taking these medications     potassium chloride (K-DUR) 10 MEQ tablet      mupirocin ointment  (BACTROBAN) 2 %        No Known Allergies Follow-up Information    Follow up with [facility] S, MD In 4 weeks.   Specialty:  Nephrology   Contact information:   [address] [address] [phone]       Follow up with [facility].   Contact information:   [address] [place] [address] [phone]       Follow up with [facility] K, MD. Schedule an appointment as soon as possible for a visit in 1 week.   Specialty:  Family Medicine   Why:  recommend BMET to evaluate electrolytes   Contact information:   [address] [place] [address] [phone]        The results of significant diagnostics from this hospitalization (including imaging, microbiology, ancillary and laboratory) are listed below for reference.  Significant Diagnostic Studies: Ct Head Wo Contrast  12/23/2014   CLINICAL DATA:  Witnessed seizure 4 hr ago. Subsequent unresponsive. History of seizures, stroke, diabetes, chronic kidney disease.  EXAM: CT HEAD WITHOUT CONTRAST  TECHNIQUE: Contiguous axial images were obtained from the base of the skull through the vertex without intravenous contrast.  COMPARISON:  CT of the head March 15, 2014  FINDINGS: No intraparenchymal hemorrhage, mass effect, midline shift or acute large vascular territory infarct. RIGHT occipital encephalomalacia with mild ex vacuo dilatation the occipital horn of the RIGHT lateral ventricle. Moderate to severe ventriculomegaly, generally on the basis of global parenchymal brain volume loss as there is overall commensurate enlargement of cerebral sulci and cerebellar folia. Disproportionate midbrain volume loss. Bilateral remote basal ganglia and thalamus lacunar infarcts. Moderate white matter changes. Basal cisterns are patent.  No paranasal sinus air-fluid levels. The mastoid air cells are well aerated. No skull fracture. Ocular globes and orbital contents are nonacute.   IMPRESSION: No acute intracranial process.  Moderate to severe brain volume loss, with suspected disproportionate midbrain atrophy.  Remote bilateral basal ganglia and thalamus lacunar infarcts. Remote RIGHT posterior cerebral artery territory infarct. Moderate white matter changes can be seen with chronic small vessel ischemic disease.   Electronically Signed   By: Elon Alas   On: 12/23/2014 02:26   US Renal  12/23/2014   CLINICAL DATA:  Acute renal failure. History of diabetes and hypertension.  EXAM: RENAL/URINARY TRACT ULTRASOUND COMPLETE  COMPARISON:  None.  FINDINGS: Right Kidney:  Length: 12.4 cm. Increased parenchymal echogenicity. No mass or stone. No hydronephrosis.  Left Kidney:  Length: 11.8 cm. Increased parenchymal echogenicity. 8 mm nonobstructing stone in the lower pole. No mass. No hydronephrosis.  Bladder:  Limited evaluation can bladder not well differentiated from adjacent ascites.  IMPRESSION: Findings consistent with medical renal disease with echogenic kidneys bilaterally. No hydronephrosis. Nonobstructing stone in the lower pole of the left kidney. No renal masses.  Ascites.   Electronically Signed   By: Lajean Manes M.D.   On: 12/23/2014 12:41   Nm Pulmonary Perf And Vent  01/02/2015   CLINICAL DATA:  Chest pain and shortness of breath for several hours  EXAM: NUCLEAR MEDICINE VENTILATION - PERFUSION LUNG SCAN  Views: Anterior, posterior, left lateral, right lateral, RPO, LPO, RAO, LAO -ventilation and perfusion  Radionuclide: Technetium 72m DTPA -ventilation; Technetium 82m macroaggregated albumin-perfusion  Dose:  40.0 mCi-ventilation; 6.2 mCi-perfusion  COMPARISON:  Chest radiograph January 02, 2015  FINDINGS: Ventilation: There are no focal ventilation defects appreciable. Heart is noted to be enlarged. Prominence in the right paratracheal region consistent with great vessel prominence also results in photopenia in this portion of the ventilation study.  Perfusion:  Cardiomegaly is noted. There are no appreciable perfusion defects. There is no ventilation/perfusion mismatch.  IMPRESSION: No appreciable ventilation or perfusion defects. This study constitutes a very low probability of pulmonary embolus.   Electronically Signed   By: Lowella Grip III M.D.   On: 01/02/2015 16:30   Dg Chest Portable 1 View  01/02/2015   CLINICAL DATA:  74 year old female with shortness of Breath. Initial encounter.  EXAM: PORTABLE CHEST - 1 VIEW  COMPARISON:  12/28/2014 and earlier.  FINDINGS: Portable AP upright view at 1117 hours. Sequelae of CABG. Stable cardiomegaly and mediastinal contours. Interval improved lung volumes and mildly decreased vascular congestion. No overt edema. No pneumothorax, pleural effusion or consolidation.  IMPRESSION: Cardiomegaly with interval improved lung volumes and mildly decreased vascular congestion, no overt  edema.   Electronically Signed   By: Genevie Ann M.D.   On: 01/02/2015 11:57   Dg Chest Port 1 View  12/28/2014   CLINICAL DATA:  Aspiration and airway.  Weakness.  EXAM: PORTABLE CHEST - 1 VIEW  COMPARISON:  12/22/2014  FINDINGS: There is moderate cardiac enlargement. Previous median sternotomy and CABG procedure. Pulmonary vascular congestion is similar to previous exam. No superimposed airspace consolidation. The visualized osseous structures are unremarkable.  IMPRESSION: 1. Cardiac enlargement and pulmonary vascular congestion.   Electronically Signed   By: Kerby Moors M.D.   On: 12/28/2014 18:43   Dg Chest Port 1 View  12/22/2014   CLINICAL DATA:  Witnessed seizure. Choking sensation tonight. History of stroke, MI, chronic kidney disease, heart disease, ischemic cardiomyopathy.  EXAM: PORTABLE CHEST - 1 VIEW  COMPARISON:  10/22/2014  FINDINGS: Postoperative changes in the mediastinum. Mild cardiac enlargement with increased pulmonary vascularity. No evidence of significant edema or consolidation. No blunting of costophrenic angles. No  pneumothorax. Calcified and tortuous aorta.  IMPRESSION: Cardiac enlargement with mild pulmonary vascular congestion.   Electronically Signed   By: Lucienne Capers M.D.   On: 12/22/2014 23:36   Dg Swallowing Func-speech Pathology  12/26/2014   Ephraim Hamburger, CCC-SLP     12/26/2014  7:24 PM  Objective Swallowing Evaluation: Modified Barium Swallowing Study   Patient Details  Name: BRENDI MCCARROLL MRN: [id number] Date of Birth: 06/27/1941  Today's Date: 12/26/2014 Time: SLP Start Time (ACUTE ONLY): 1315-SLP Stop Time (ACUTE  ONLY): 1345 SLP Time Calculation (min) (ACUTE ONLY): 30 min  Past Medical History:  Past Medical History  Diagnosis Date  . Type 2 diabetes mellitus   . Essential hypertension, benign   . History of stroke     Previously on Coumadin  . MI, old     Reported 30  . Seizure disorder   . Coronary atherosclerosis of native coronary artery     Multivessel status post CABG 2002  . History of GI bleed     Erosive gastritis and duodenitis by EGD 2002  . Mixed hyperlipidemia   . Ischemic cardiomyopathy     LVEF 40-45% March 2013  . Dementia   . Stroke   . Seizures   . CKD (chronic kidney disease) stage 4, GFR 15-29 ml/min   . CHF (congestive heart failure)    Past Surgical History:  Past Surgical History  Procedure Laterality Date  . Coronary artery bypass graft  July 2002    LIMA to LAD, SVG to OM1, SVG to OM 2, SVG to RCA  . Tee without cardioversion  01/28/2012    Procedure: TRANSESOPHAGEAL ECHOCARDIOGRAM (TEE);  Surgeon:  Lelon Perla, MD;  Location: [facility] ENDOSCOPY;  Service:  Cardiovascular;  Laterality: [name];  . Colonoscopy  2002  . Esophagogastroduodenoscopy  2002  . Esophagogastroduodenoscopy [name] 01/18/2014    XJD:BZMCE hiatal hernia. Abnormal gastric and duodenal bulbar  mucosa of uncertain significance - status post gastric bx  (chronic gastritis/H.pylori +  . Colonoscopy [name] 06/25/2014    Dr. Laural Golden: tubular adenoma, pandiverticulosis.   . Agile capsule [name] 07/06/2014    Procedure: AGILE CAPSULE;   Surgeon: Danie Binder, MD;   Location: [facility] ENDO SUITE;  Service: Endoscopy;  Laterality: [name];   730  . Esophagogastroduodenoscopy [name] 07/20/2014    Dr. Gala Romney: tiny gastric polyps not manipulated. minimal  pigmentation of duodenal bulb likely not significant  . Givens capsule study [name] 07/20/2014    incomplete  HPI:  HPI: Ms. Mackenna Kamer is a 74 yo woman who was admitted after  possible seizure at home while drinking water. She has acute on  chronic kidney disease stage 4. Likely pre renal. Patient was  taking ARB/demadex prior to admission. Continue IV fluids. Some  improvement in renal function today. Renal ultrasound without  obstruction. Appreciate nephrology assistance. Poss Seizure.  Patient had a spell after a choking episode at home. She was not  post ictal and did not have any bladder/bowel incontinence.  Although the patient does have a seizure disorder, it is unlikely  that she had a seizure. Will continue to monitor. Continue  Keppra. Pt with hypernatremia, DM2, and sliding scale. Daughter  reports that pt gets "strangled" on water, but not soda. SLP  asked to evaluate swallow function.   No Data Recorded  Assessment / Plan / Recommendation CHL IP CLINICAL IMPRESSIONS 12/26/2014  Dysphagia Diagnosis Mild oral phase dysphagia;Moderate oral phase  dysphagia;Mild pharyngeal phase dysphagia Clinical impression Ms.  Stave was seen for MBSS up in hausted chair this afternoon to  help determine safest diet and use of strategies as needed. She  presents with a mild/moderate oropharyngeal sensorimotor based  dysphagia characterized by delayed and inefficient oral transit  with both solids and liquids, lingual pumping, decreased bolus  cohesiveness, and decreased lingual to palatal contact. This  results in spillover of bolus to valleculae and pyriforms and  palatal and lingual residuals after the swallow which then pool  in pharynx. Pharyngeal phase is marked by a delay in swallow  initiation as stated above,  with swallow trigger after spilling  to pyriforms. There is no pharyngeal residue after the swallow  until oral residuals spill to the valleculae and pyriforms to  which pt does not appear sensate. Hyolaryngeal excursion appears  adequate. The oral residuals that spill after the primary swallow  are what become problematic. Pt presents with mild wet vocal  quality/congestion but has poor awareness of the same and  requires cues to "swallow again". Pt demonstrated only flash  penetration with use of straw. Recommend D3/mech soft with thin  liquids via small cup sip. Pt needs to swallow 2x for each  bite/sip. It is often difficult for her to initiate a second  swallow, but is able to do after some delay. When pt presents  with congested/wet breathing/voice, cue pt to "swallow again" to  clear pharyngeal residue. SLP will follow while inpatient for  diet tolerance, pt/caregiver education, and implementation of  compensatory strategies.      CHL IP TREATMENT RECOMMENDATION 12/26/2014  Treatment Plan Recommendations Therapy as outlined in treatment  plan below     CHL IP DIET RECOMMENDATION 12/26/2014  Diet Recommendations Dysphagia 3 (Mechanical Soft);Thin liquid  Liquid Administration via Cup;No straw  Medication Administration Crushed with puree Compensations Slow  rate;Small sips/bites;Multiple dry swallows after each bite/sip  Postural Changes and/or Swallow Maneuvers Seated upright 90  degrees;Upright 30-60 min after meal     CHL IP OTHER RECOMMENDATIONS 12/26/2014  Recommended Consults (None)  Oral Care Recommendations Oral care BID  Other Recommendations Clarify dietary restrictions     CHL IP FOLLOW UP RECOMMENDATIONS 12/26/2014  Follow up Recommendations None     CHL IP FREQUENCY AND DURATION 12/26/2014  Speech Therapy Frequency (ACUTE ONLY) min 2x/week  Treatment Duration 1 week     Pertinent Vitals/Pain VSS    SLP Swallow Goals CHL IP SWALLOW STUDY GOALS 03/17/2014  Patient will consume recommended diet without observed  clinical  signs of aspiration with Independent assistance  Swallow Study Goal #1 - Progress Met  Patient will utilize recommended strategies during swallow to  increase swallowing safety with Independent assistance  Swallow Study Goal #2 - Progress Met  Goal #3 (None)  Swallow Study Goal #3 - Progress (None)  Goal #4 (None)  Swallow Study Goal #4 - Progress (None)    No flowsheet data found.    CHL IP REASON FOR REFERRAL 12/26/2014  Reason for Referral Objectively evaluate swallowing function     CHL IP ORAL PHASE 12/26/2014  Lips (None)  Tongue (None)  Mucous membranes (None)  Nutritional status (None)  Other (None)  Oxygen therapy (None)  Oral Phase Impaired  Oral - Pudding Teaspoon (None)  Oral - Pudding Cup (None)  Oral - Honey Teaspoon (None)  Oral - Honey Cup (None)  Oral - Honey Syringe (None)  Oral - Nectar Teaspoon (None)  Oral - Nectar Cup Incomplete tongue to palate  contact;Lingual/palatal residue;Delayed oral transit  Oral - Nectar Straw (None)  Oral - Nectar Syringe (None)  Oral - Ice Chips (None)  Oral - Thin Teaspoon Delayed oral transit;Incomplete tongue to  palate contact  Oral - Thin Cup Delayed oral transit;Piecemeal  swallowing;Lingual/palatal residue;Weak lingual  manipulation;Lingual pumping;Holding of bolus  Oral - Thin Straw Lingual pumping;Incomplete tongue to palate  contact;Lingual/palatal residue  Oral - Thin Syringe (None)  Oral - Puree Lingual pumping;Reduced posterior propulsion;Holding  of bolus;Delayed oral transit  Oral - Mechanical Soft (None)  Oral - Regular Weak lingual manipulation;Lingual pumping;Reduced  posterior propulsion;Holding of bolus;Lingual/palatal  residue;Piecemeal swallowing;Delayed oral transit  Oral - Multi-consistency (None)  Oral - Pill (None)  Oral Phase - Comment (None)      CHL IP PHARYNGEAL PHASE 12/26/2014 Pharyngeal Phase Impaired  Pharyngeal - Pudding Teaspoon (None)  Penetration/Aspiration details (pudding teaspoon) (None)  Pharyngeal - Pudding Cup (None)   Penetration/Aspiration details (pudding cup) (None)  Pharyngeal - Honey Teaspoon (None)  Penetration/Aspiration details (honey teaspoon) (None)  Pharyngeal - Honey Cup (None)  Penetration/Aspiration details (honey cup) (None) Pharyngeal -  Honey Syringe (None)  Penetration/Aspiration details (honey syringe) (None)  Pharyngeal - Nectar Teaspoon (None)  Penetration/Aspiration details (nectar teaspoon) (None)  Pharyngeal - Nectar Cup Delayed swallow initiation;Premature  spillage to valleculae;Premature spillage to pyriform  sinuses;Pharyngeal residue - valleculae;Pharyngeal residue -  pyriform sinuses  Penetration/Aspiration details (nectar cup) (None)  Pharyngeal - Nectar Straw (None)  Penetration/Aspiration details (nectar straw) (None)  Pharyngeal - Nectar Syringe (None)  Penetration/Aspiration details (nectar syringe) (None)  Pharyngeal - Ice Chips (None)  Penetration/Aspiration details (ice chips) (None)  Pharyngeal - Thin Teaspoon Delayed swallow initiation;Premature  spillage to valleculae;Premature spillage to pyriform sinuses  Penetration/Aspiration details (thin teaspoon) (None)  Pharyngeal - Thin Cup Delayed swallow initiation;Premature  spillage to valleculae;Premature spillage to pyriform  sinuses;Pharyngeal residue - valleculae;Pharyngeal residue -  pyriform sinuses  Penetration/Aspiration details (thin cup) (None)  Pharyngeal - Thin Straw Delayed swallow initiation;Premature  spillage to valleculae;Premature spillage to pyriform  sinuses;Penetration/Aspiration during swallow;Pharyngeal residue  - valleculae;Pharyngeal residue - pyriform sinuses  Penetration/Aspiration details (thin straw) Material enters  airway, remains ABOVE vocal cords then ejected out  Pharyngeal - Thin Syringe (None)  Penetration/Aspiration details (thin syringe') (None)  Pharyngeal - Puree Delayed swallow initiation;Premature spillage  to valleculae  Penetration/Aspiration details (puree) (None)  Pharyngeal - Mechanical Soft  (None)  Penetration/Aspiration details (mechanical soft) (None)  Pharyngeal - Regular Delayed swallow initiation;Premature  spillage to valleculae;Premature spillage to pyriform sinuses  Penetration/Aspiration details (  regular) (None)  Pharyngeal - Multi-consistency (None)  Penetration/Aspiration details (multi-consistency) (None)  Pharyngeal - Pill (None)  Penetration/Aspiration details (pill) (None)  Pharyngeal Comment (None)     CHL IP CERVICAL ESOPHAGEAL PHASE 12/26/2014  Cervical Esophageal Phase WFL  Pudding Teaspoon (None)  Pudding Cup (None)  Honey Teaspoon (None)  Honey Cup (None)  Honey Syringe (None)  Nectar Teaspoon (None)  Nectar Cup (None)  Nectar Straw (None)  Nectar Syringe (None)  Thin Teaspoon (None)  Thin Cup (None)  Thin Straw (None)  Thin Syringe (None)  Cervical Esophageal Comment (None)    No flowsheet data found.        Thank you,  Genene Churn, [place]  [place] 12/26/2014, 7:22 PM     Microbiology: No results found for this or any previous visit (from the past 240 hour(s)).   Labs: Basic Metabolic Panel:  Recent Labs Lab 01/05/15 0549 01/06/15 1607 01/07/15 0609 01/08/15 0555 01/09/15 0543  NA 142 143 145 149* 148*  K 4.3 4.3 4.0 4.1 4.1  CL 110 109 110 109 107  CO2 _0 33* 32  GLUCOSE 129* 139* 120* 138* 142*  BUN 66* 65* 70* 73* 72*  CREATININE 3.69* 3.61* 3.71* 3.74* 3.63*  CALCIUM 7.8* 7.8* 7.9* 8.3* 8.6  PHOS 3.6  --   --   --   --    Liver Function Tests: No results for input(s): AST, ALT, ALKPHOS, BILITOT, PROT, ALBUMIN in the last 168 hours. No results for input(s): LIPASE, AMYLASE in the last 168 hours. No results for input(s): AMMONIA in the last 168 hours. CBC:  Recent Labs Lab 01/02/15 1125  01/05/15 0549 01/06/15 0633 01/07/15 0609 01/08/15 0555 01/09/15 0543  WBC 5.2  < > 6.3 5.2 5.2 5.6 5.6  NEUTROABS 3.3  --   --   --   --   --   --   HGB 9.3*  < > 8.0* 7.9* 8.0* 7.7* 9.3*  HCT 31.7*  < > 27.2* 27.3* 27.5* 26.2*  31.2*  MCV 82.8  < > 82.2 83.0 82.8 82.6 82.3  PLT 166  < > 171 191 159 192 201  < > = values in this interval not displayed. Cardiac Enzymes:  Recent Labs Lab 01/02/15 1125 01/02/15 1707 01/02/15 2343  TROPONINI 0.13* 0.12* 0.10*   BNP: BNP (last 3 results)  Recent Labs  01/02/15 1125  BNP >4500.0*    ProBNP (last 3 results)  Recent Labs  03/15/14 1645 04/27/14 1536 06/09/14 1021  PROBNP 37134.0* 51631.0* 30358.0*    CBG:  Recent Labs Lab 01/08/15 0717 01/08/15 1130 01/08/15 1640 01/08/15 1955 01/09/15 0737  GLUCAP 142* 143* 140* 210* 133*       Signed:  Daija Routson M  [facility] 01/09/2015, 9:36 AM

## 2015-01-10 NOTE — Care Management Utilization Note (Signed)
UR completed 

## 2015-01-15 ENCOUNTER — Encounter (HOSPITAL_COMMUNITY): Payer: Medicare Other | Attending: Hematology and Oncology

## 2015-01-15 ENCOUNTER — Encounter (HOSPITAL_BASED_OUTPATIENT_CLINIC_OR_DEPARTMENT_OTHER): Payer: Medicare Other

## 2015-01-15 DIAGNOSIS — Z951 Presence of aortocoronary bypass graft: Secondary | ICD-10-CM | POA: Diagnosis not present

## 2015-01-15 DIAGNOSIS — D631 Anemia in chronic kidney disease: Secondary | ICD-10-CM

## 2015-01-15 DIAGNOSIS — E119 Type 2 diabetes mellitus without complications: Secondary | ICD-10-CM | POA: Diagnosis not present

## 2015-01-15 DIAGNOSIS — N184 Chronic kidney disease, stage 4 (severe): Secondary | ICD-10-CM

## 2015-01-15 DIAGNOSIS — F0390 Unspecified dementia without behavioral disturbance: Secondary | ICD-10-CM | POA: Diagnosis not present

## 2015-01-15 DIAGNOSIS — Z79899 Other long term (current) drug therapy: Secondary | ICD-10-CM | POA: Insufficient documentation

## 2015-01-15 DIAGNOSIS — Z87891 Personal history of nicotine dependence: Secondary | ICD-10-CM | POA: Diagnosis not present

## 2015-01-15 DIAGNOSIS — E780 Pure hypercholesterolemia: Secondary | ICD-10-CM | POA: Insufficient documentation

## 2015-01-15 DIAGNOSIS — I129 Hypertensive chronic kidney disease with stage 1 through stage 4 chronic kidney disease, or unspecified chronic kidney disease: Secondary | ICD-10-CM | POA: Insufficient documentation

## 2015-01-15 DIAGNOSIS — I252 Old myocardial infarction: Secondary | ICD-10-CM | POA: Diagnosis not present

## 2015-01-15 DIAGNOSIS — Z87898 Personal history of other specified conditions: Secondary | ICD-10-CM | POA: Diagnosis not present

## 2015-01-15 DIAGNOSIS — I509 Heart failure, unspecified: Secondary | ICD-10-CM | POA: Insufficient documentation

## 2015-01-15 DIAGNOSIS — I2510 Atherosclerotic heart disease of native coronary artery without angina pectoris: Secondary | ICD-10-CM | POA: Diagnosis not present

## 2015-01-15 DIAGNOSIS — D649 Anemia, unspecified: Secondary | ICD-10-CM | POA: Insufficient documentation

## 2015-01-15 DIAGNOSIS — Z8673 Personal history of transient ischemic attack (TIA), and cerebral infarction without residual deficits: Secondary | ICD-10-CM | POA: Insufficient documentation

## 2015-01-15 DIAGNOSIS — R569 Unspecified convulsions: Secondary | ICD-10-CM | POA: Insufficient documentation

## 2015-01-15 DIAGNOSIS — Z794 Long term (current) use of insulin: Secondary | ICD-10-CM | POA: Diagnosis not present

## 2015-01-15 LAB — CBC
HCT: 33.1 % — ABNORMAL LOW (ref 36.0–46.0)
Hemoglobin: 9.8 g/dL — ABNORMAL LOW (ref 12.0–15.0)
MCH: 25.2 pg — AB (ref 26.0–34.0)
MCHC: 29.6 g/dL — AB (ref 30.0–36.0)
MCV: 85.1 fL (ref 78.0–100.0)
Platelets: 202 10*3/uL (ref 150–400)
RBC: 3.89 MIL/uL (ref 3.87–5.11)
RDW: 24.2 % — ABNORMAL HIGH (ref 11.5–15.5)
WBC: 5 10*3/uL (ref 4.0–10.5)

## 2015-01-15 MED ORDER — DARBEPOETIN ALFA 60 MCG/0.3ML IJ SOSY
PREFILLED_SYRINGE | INTRAMUSCULAR | Status: AC
  Filled 2015-01-15: qty 0.3

## 2015-01-15 MED ORDER — DARBEPOETIN ALFA 60 MCG/0.3ML IJ SOSY
60.0000 ug | PREFILLED_SYRINGE | Freq: Once | INTRAMUSCULAR | Status: AC
  Administered 2015-01-15: 60 ug via SUBCUTANEOUS

## 2015-01-15 NOTE — Progress Notes (Signed)
[name] presents today for injection per MD orders. Aranesp 60 mcg administered SQ in right Abdomen. Administration without incident. Patient tolerated well.

## 2015-01-15 NOTE — Patient Instructions (Signed)
[place] at [facility] Discharge Instructions  RECOMMENDATIONS MADE BY THE CONSULTANT AND ANY TEST RESULTS WILL BE SENT TO YOUR REFERRING PHYSICIAN.  Iron infusion not needed here. You received iron infusion in the hospital 01/06/15. We are checking your ferritin (iron) level today.  Aranesp injection today as ordered. Return to clinic 01/22/15 as scheduled for MD appointment. We will make additional appointments after you see the doctor.  Thank you for choosing [place] at [facility] to provide your oncology and hematology care.  To afford each patient quality time with our provider, please arrive at least 15 minutes before your scheduled appointment time.    You need to re-schedule your appointment should you arrive 10 or more minutes late.  We strive to give you quality time with our providers, and arriving late affects you and other patients whose appointments are after yours.  Also, if you no show three or more times for appointments you may be dismissed from the clinic at the providers discretion.     Again, thank you for choosing [facility].  Our hope is that these requests will decrease the amount of time that you wait before being seen by our physicians.       _____________________________________________________________  Should you have questions after your visit to [facility], please contact our office at [phone] between the hours of 8:30 a.m. and 4:30 p.m.  Voicemails left after 4:30 p.m. will not be returned until the following business day.  For prescription refill requests, have your pharmacy contact our office.

## 2015-01-15 NOTE — Progress Notes (Signed)
Labs drawn for cbc,ferr

## 2015-01-16 ENCOUNTER — Other Ambulatory Visit (HOSPITAL_COMMUNITY): Payer: Self-pay

## 2015-01-16 LAB — FERRITIN: Ferritin: 925 ng/mL — ABNORMAL HIGH (ref 10–291)

## 2015-01-21 NOTE — Progress Notes (Signed)
[name], MD [address] [place] [address]  Anemia of chronic renal failure, stage 4 (severe) - Plan: CBC with Differential  Iron deficiency anemia - Plan: CBC with Differential, Ferritin  Anemia associated with acute blood loss - Plan: CBC with Differential, Ferritin  CKD (chronic kidney disease) stage 4, GFR 15-29 ml/min - Plan: CBC with Differential  CURRENT THERAPY: Aranesp 60 mcg every 2 weeks.  Last Ferric Gluconate infusion was on 12/27/2014 at 250 mg.  INTERVAL HISTORY: [name] 74 y.o. female returns for followup of anemia of chronic renal disease, Stage IV, requiring ESA support with Aranesp.  Additionally, she has an element of iron deficiency anemia, requiring IV iron occasionally.    Chart reviewed.  Hospitalization noted from 12/22/2014 - 12/31/2014 for acute renal injury superimposed on chronic renal disease, stage IV, compounded by hypernatremia, possible seizure, and suspected aspiration while in the hospital.  Additional hospitalization noted from 01/02/2015- 01/09/2015 for acute on chronic respiratory failure with hypoxia.  I personally reviewed and went over laboratory results with the patient.  The results are noted within this dictation.  She denies any hematology related complaints.  She denies any blood in stool and black tarry stools.  She has an upcoming appt at [facility] for colonoscopy.    Past Medical History  Diagnosis Date  . Type 2 diabetes mellitus   . Essential hypertension, benign   . History of stroke     Previously on Coumadin  . MI, old     Reported 25  . Seizure disorder   . Coronary atherosclerosis of native coronary artery     Multivessel status post CABG 2002  . History of GI bleed     Erosive gastritis and duodenitis by EGD 2002  . Mixed hyperlipidemia   . Ischemic cardiomyopathy     LVEF 40-45% March 2013  . Dementia   . CKD (chronic kidney disease) stage 4, GFR 15-29 ml/min   . CHF (congestive heart  failure)   . Chronic respiratory failure with hypoxia   . Stroke   . Seizures   . Anemia of chronic renal failure, stage 4 (severe) 01/02/2015    has Acute CVA Left frontal lobe,; Hypertension; Diabetes mellitus; Seizure disorder; ARF (acute renal failure); Neutropenia; CVA (cerebral infarction); Hyperglycemia; CKD (chronic kidney disease) stage 4, GFR 15-29 ml/min; Diabetes; Dehydration; Bilateral lower extremity edema; GI bleeding; Weakness generalized; Anemia associated with acute blood loss; NSTEMI (non-ST elevated myocardial infarction); Systolic CHF, chronic last EF 40% 3/13; Iron deficiency anemia; Diarrhea; Edema of right lower extremity; CHF, acute on chronic; Dementia; CHF (congestive heart failure); Severe mitral regurgitation; Acute on chronic combined systolic and diastolic congestive heart failure; CAP (community acquired pneumonia); Hypoglycemia; Choking episode; Severe anemia; Cardiomyopathy, ischemic; Acute renal failure superimposed on stage 4 chronic kidney disease; Lower GI bleeding; Swelling of both ankles; CKD (chronic kidney disease); Seizure; Acute renal failure; Anemia; Hypernatremia; Dysphagia; AKI (acute kidney injury); Anemia of chronic renal failure, stage 4 (severe); Acute on chronic respiratory failure with hypoxia; Elevated troponin; and UTI (urinary tract infection) on her problem list.     has No Known Allergies.  Ms. Nardone does not currently have medications on file.  Past Surgical History  Procedure Laterality Date  . Coronary artery bypass graft  July 2002    LIMA to LAD, SVG to OM1, SVG to OM 2, SVG to RCA  . Tee without cardioversion  01/28/2012    Procedure:  TRANSESOPHAGEAL ECHOCARDIOGRAM (TEE);  Surgeon: Lelon Perla, MD;  Location: [facility] ENDOSCOPY;  Service: Cardiovascular;  Laterality: [name];  . Colonoscopy  2002  . Esophagogastroduodenoscopy  2002  . Esophagogastroduodenoscopy [name] 01/18/2014    [id number] hiatal hernia. Abnormal gastric and duodenal bulbar  mucosa of uncertain significance - status post gastric bx (chronic gastritis/H.pylori +  . Colonoscopy [name] 06/25/2014    Dr. Laural Golden: tubular adenoma, pandiverticulosis.   . Agile capsule [name] 07/06/2014    Procedure: AGILE CAPSULE;  Surgeon: Danie Binder, MD;  Location[facility];  Service: Endoscopy;  Laterality: [name];  730  . Esophagogastroduodenoscopy [name] 07/20/2014    Dr. Gala Romney: tiny gastric polyps not manipulated. minimal pigmentation of duodenal bulb likely not significant  . Givens capsule study [name] 07/20/2014    incomplete    Denies any headaches, dizziness, double vision, fevers, chills, night sweats, nausea, vomiting, diarrhea, constipation, chest pain, heart palpitations, shortness of breath, blood in stool, black tarry stool, urinary pain, urinary burning, urinary frequency, hematuria.   PHYSICAL EXAMINATION  ECOG PERFORMANCE STATUS: 2 - Symptomatic, <50% confined to bed  Filed Vitals:   01/22/15 1457  BP: 141/68  Pulse: 78  Temp: 97.4 F (36.3 C)  Resp: 20    GENERAL:alert, no distress, well nourished, well developed, comfortable, cooperative, smiling and mentally handicapped SKIN: skin color, texture, turgor are normal, no rashes or significant lesions HEAD: Normocephalic, No masses, lesions, tenderness or abnormalities EYES: normal, PERRLA, EOMI, Conjunctiva are pink and non-injected EARS: External ears normal OROPHARYNX:lips, buccal mucosa, and tongue normal and mucous membranes are moist  NECK: supple, no stridor, non-tender LYMPH:  no palpable lymphadenopathy, not examined BREAST:not examined LUNGS: clear to auscultation  HEART: regular rate & rhythm, no murmurs and no gallops ABDOMEN:abdomen soft and normal bowel sounds BACK: Back symmetric, no curvature., No CVA tenderness EXTREMITIES:less then 2 second capillary refill, no joint deformities, effusion, or inflammation, no skin discoloration, no cyanosis  NEURO: no focal motor/sensory deficits, in  wheelchair   LABORATORY DATA: CBC    Component Value Date/Time   WBC 5.0 01/15/2015 1316   RBC 3.89 01/15/2015 1316   RBC 1.74* 01/16/2014 1918   HGB 9.8* 01/15/2015 1316   HCT 33.1* 01/15/2015 1316   PLT 202 01/15/2015 1316   MCV 85.1 01/15/2015 1316   MCH 25.2* 01/15/2015 1316   MCHC 29.6* 01/15/2015 1316   RDW 24.2* 01/15/2015 1316   LYMPHSABS 1.3 01/02/2015 1125   MONOABS 0.5 01/02/2015 1125   EOSABS 0.0 01/02/2015 1125   BASOSABS 0.0 01/02/2015 1125      Chemistry      Component Value Date/Time   NA 148* 01/09/2015 0543   K 4.1 01/09/2015 0543   CL 107 01/09/2015 0543   CO2 32 01/09/2015 0543   BUN 72* 01/09/2015 0543   CREATININE 3.63* 01/09/2015 0543      Component Value Date/Time   CALCIUM 8.6 01/09/2015 0543   CALCIUM 8.3* 04/28/2014 0815   ALKPHOS 119* 12/23/2014 0639   AST 35 12/23/2014 0639   ALT 19 12/23/2014 0639   BILITOT 1.1 12/23/2014 0639      RADIOGRAPHIC STUDIES:  Nm Pulmonary Perf And Vent  01/02/2015   CLINICAL DATA:  Chest pain and shortness of breath for several hours  EXAM: NUCLEAR MEDICINE VENTILATION - PERFUSION LUNG SCAN  Views: Anterior, posterior, left lateral, right lateral, RPO, LPO, RAO, LAO -ventilation and perfusion  Radionuclide: Technetium 83mDTPA -ventilation; Technetium 975macroaggregated albumin-perfusion  Dose:  40.0 mCi-ventilation; 6.2 mCi-perfusion  COMPARISON:  Chest radiograph January 02, 2015  FINDINGS: Ventilation: There are no focal ventilation defects appreciable. Heart is noted to be enlarged. Prominence in the right paratracheal region consistent with great vessel prominence also results in photopenia in this portion of the ventilation study.  Perfusion: Cardiomegaly is noted. There are no appreciable perfusion defects. There is no ventilation/perfusion mismatch.  IMPRESSION: No appreciable ventilation or perfusion defects. This study constitutes a very low probability of pulmonary embolus.   Electronically Signed    By: Lowella Grip III M.D.   On: 01/02/2015 16:30   Dg Chest Portable 1 View  01/02/2015   CLINICAL DATA:  74 year old female with shortness of Breath. Initial encounter.  EXAM: PORTABLE CHEST - 1 VIEW  COMPARISON:  12/28/2014 and earlier.  FINDINGS: Portable AP upright view at 1117 hours. Sequelae of CABG. Stable cardiomegaly and mediastinal contours. Interval improved lung volumes and mildly decreased vascular congestion. No overt edema. No pneumothorax, pleural effusion or consolidation.  IMPRESSION: Cardiomegaly with interval improved lung volumes and mildly decreased vascular congestion, no overt edema.   Electronically Signed   By: Genevie Ann M.D.   On: 01/02/2015 11:57   Dg Chest Port 1 View  12/28/2014   CLINICAL DATA:  Aspiration and airway.  Weakness.  EXAM: PORTABLE CHEST - 1 VIEW  COMPARISON:  12/22/2014  FINDINGS: There is moderate cardiac enlargement. Previous median sternotomy and CABG procedure. Pulmonary vascular congestion is similar to previous exam. No superimposed airspace consolidation. The visualized osseous structures are unremarkable.  IMPRESSION: 1. Cardiac enlargement and pulmonary vascular congestion.   Electronically Signed   By: Kerby Moors M.D.   On: 12/28/2014 18:43   Dg Swallowing Func-speech Pathology  12/26/2014   Ephraim Hamburger, CCC-SLP     12/26/2014  7:24 PM  Objective Swallowing Evaluation: Modified Barium Swallowing Study   Patient Details  Name: ED RAYSON MRN: [id number] Date of Birth: 1941-06-24  Today's Date: 12/26/2014 Time: SLP Start Time (ACUTE ONLY): 1315-SLP Stop Time (ACUTE  ONLY): 1345 SLP Time Calculation (min) (ACUTE ONLY): 30 min  Past Medical History:  Past Medical History  Diagnosis Date  . Type 2 diabetes mellitus   . Essential hypertension, benign   . History of stroke     Previously on Coumadin  . MI, old     Reported 79  . Seizure disorder   . Coronary atherosclerosis of native coronary artery     Multivessel status post CABG 2002  . History of  GI bleed     Erosive gastritis and duodenitis by EGD 2002  . Mixed hyperlipidemia   . Ischemic cardiomyopathy     LVEF 40-45% March 2013  . Dementia   . Stroke   . Seizures   . CKD (chronic kidney disease) stage 4, GFR 15-29 ml/min   . CHF (congestive heart failure)    Past Surgical History:  Past Surgical History  Procedure Laterality Date  . Coronary artery bypass graft  July 2002    LIMA to LAD, SVG to OM1, SVG to OM 2, SVG to RCA  . Tee without cardioversion  01/28/2012    Procedure: TRANSESOPHAGEAL ECHOCARDIOGRAM (TEE);  Surgeon:  Lelon Perla, MD;  Location: [facility] ENDOSCOPY;  Service:  Cardiovascular;  Laterality: [name];  . Colonoscopy  2002  . Esophagogastroduodenoscopy  2002  . Esophagogastroduodenoscopy [name] 01/18/2014    SAY:TKZSW hiatal hernia. Abnormal gastric and duodenal bulbar  mucosa of uncertain significance - status post gastric bx  (chronic gastritis/H.pylori +  .  Colonoscopy [name] 06/25/2014    Dr. Laural Golden: tubular adenoma, pandiverticulosis.   . Agile capsule [name] 07/06/2014    Procedure: AGILE CAPSULE;  Surgeon: Danie Binder, MD;   Location: [facility];  Service: Endoscopy;  Laterality: [name];   730  . Esophagogastroduodenoscopy [name] 07/20/2014    Dr. Gala Romney: tiny gastric polyps not manipulated. minimal  pigmentation of duodenal bulb likely not significant  . Givens capsule study [name] 07/20/2014    incomplete   HPI:  HPI: Ms. Teneshia Hedeen is a 74 yo woman who was admitted after  possible seizure at home while drinking water. She has acute on  chronic kidney disease stage 4. Likely pre renal. Patient was  taking ARB/demadex prior to admission. Continue IV fluids. Some  improvement in renal function today. Renal ultrasound without  obstruction. Appreciate nephrology assistance. Poss Seizure.  Patient had a spell after a choking episode at home. She was not  post ictal and did not have any bladder/bowel incontinence.  Although the patient does have a seizure disorder, it is unlikely  that she had a seizure.  Will continue to monitor. Continue  Keppra. Pt with hypernatremia, DM2, and sliding scale. Daughter  reports that pt gets "strangled" on water, but not soda. SLP  asked to evaluate swallow function.   No Data Recorded  Assessment / Plan / Recommendation CHL IP CLINICAL IMPRESSIONS 12/26/2014  Dysphagia Diagnosis Mild oral phase dysphagia;Moderate oral phase  dysphagia;Mild pharyngeal phase dysphagia Clinical impression Ms.  Schmelzer was seen for MBSS up in hausted chair this afternoon to  help determine safest diet and use of strategies as needed. She  presents with a mild/moderate oropharyngeal sensorimotor based  dysphagia characterized by delayed and inefficient oral transit  with both solids and liquids, lingual pumping, decreased bolus  cohesiveness, and decreased lingual to palatal contact. This  results in spillover of bolus to valleculae and pyriforms and  palatal and lingual residuals after the swallow which then pool  in pharynx. Pharyngeal phase is marked by a delay in swallow  initiation as stated above, with swallow trigger after spilling  to pyriforms. There is no pharyngeal residue after the swallow  until oral residuals spill to the valleculae and pyriforms to  which pt does not appear sensate. Hyolaryngeal excursion appears  adequate. The oral residuals that spill after the primary swallow  are what become problematic. Pt presents with mild wet vocal  quality/congestion but has poor awareness of the same and  requires cues to "swallow again". Pt demonstrated only flash  penetration with use of straw. Recommend D3/mech soft with thin  liquids via small cup sip. Pt needs to swallow 2x for each  bite/sip. It is often difficult for her to initiate a second  swallow, but is able to do after some delay. When pt presents  with congested/wet breathing/voice, cue pt to "swallow again" to  clear pharyngeal residue. SLP will follow while inpatient for  diet tolerance, pt/caregiver education, and implementation of   compensatory strategies.      CHL IP TREATMENT RECOMMENDATION 12/26/2014  Treatment Plan Recommendations Therapy as outlined in treatment  plan below     CHL IP DIET RECOMMENDATION 12/26/2014  Diet Recommendations Dysphagia 3 (Mechanical Soft);Thin liquid  Liquid Administration via Cup;No straw  Medication Administration Crushed with puree Compensations Slow  rate;Small sips/bites;Multiple dry swallows after each bite/sip  Postural Changes and/or Swallow Maneuvers Seated upright 90  degrees;Upright 30-60 min after meal     CHL IP OTHER RECOMMENDATIONS 12/26/2014  Recommended Consults (None)  Oral Care Recommendations Oral care BID  Other Recommendations Clarify dietary restrictions     CHL IP FOLLOW UP RECOMMENDATIONS 12/26/2014  Follow up Recommendations None     CHL IP FREQUENCY AND DURATION 12/26/2014  Speech Therapy Frequency (ACUTE ONLY) min 2x/week  Treatment Duration 1 week     Pertinent Vitals/Pain VSS    SLP Swallow Goals CHL IP SWALLOW STUDY GOALS 03/17/2014  Patient will consume recommended diet without observed clinical  signs of aspiration with Independent assistance  Swallow Study Goal #1 - Progress Met  Patient will utilize recommended strategies during swallow to  increase swallowing safety with Independent assistance  Swallow Study Goal #2 - Progress Met  Goal #3 (None)  Swallow Study Goal #3 - Progress (None)  Goal #4 (None)  Swallow Study Goal #4 - Progress (None)    No flowsheet data found.    CHL IP REASON FOR REFERRAL 12/26/2014  Reason for Referral Objectively evaluate swallowing function     CHL IP ORAL PHASE 12/26/2014  Lips (None)  Tongue (None)  Mucous membranes (None)  Nutritional status (None)  Other (None)  Oxygen therapy (None)  Oral Phase Impaired  Oral - Pudding Teaspoon (None)  Oral - Pudding Cup (None)  Oral - Honey Teaspoon (None)  Oral - Honey Cup (None)  Oral - Honey Syringe (None)  Oral - Nectar Teaspoon (None)  Oral - Nectar Cup Incomplete tongue to palate  contact;Lingual/palatal  residue;Delayed oral transit  Oral - Nectar Straw (None)  Oral - Nectar Syringe (None)  Oral - Ice Chips (None)  Oral - Thin Teaspoon Delayed oral transit;Incomplete tongue to  palate contact  Oral - Thin Cup Delayed oral transit;Piecemeal  swallowing;Lingual/palatal residue;Weak lingual  manipulation;Lingual pumping;Holding of bolus  Oral - Thin Straw Lingual pumping;Incomplete tongue to palate  contact;Lingual/palatal residue  Oral - Thin Syringe (None)  Oral - Puree Lingual pumping;Reduced posterior propulsion;Holding  of bolus;Delayed oral transit  Oral - Mechanical Soft (None)  Oral - Regular Weak lingual manipulation;Lingual pumping;Reduced  posterior propulsion;Holding of bolus;Lingual/palatal  residue;Piecemeal swallowing;Delayed oral transit  Oral - Multi-consistency (None)  Oral - Pill (None)  Oral Phase - Comment (None)      CHL IP PHARYNGEAL PHASE 12/26/2014 Pharyngeal Phase Impaired  Pharyngeal - Pudding Teaspoon (None)  Penetration/Aspiration details (pudding teaspoon) (None)  Pharyngeal - Pudding Cup (None)  Penetration/Aspiration details (pudding cup) (None)  Pharyngeal - Honey Teaspoon (None)  Penetration/Aspiration details (honey teaspoon) (None)  Pharyngeal - Honey Cup (None)  Penetration/Aspiration details (honey cup) (None) Pharyngeal -  Honey Syringe (None)  Penetration/Aspiration details (honey syringe) (None)  Pharyngeal - Nectar Teaspoon (None)  Penetration/Aspiration details (nectar teaspoon) (None)  Pharyngeal - Nectar Cup Delayed swallow initiation;Premature  spillage to valleculae;Premature spillage to pyriform  sinuses;Pharyngeal residue - valleculae;Pharyngeal residue -  pyriform sinuses  Penetration/Aspiration details (nectar cup) (None)  Pharyngeal - Nectar Straw (None)  Penetration/Aspiration details (nectar straw) (None)  Pharyngeal - Nectar Syringe (None)  Penetration/Aspiration details (nectar syringe) (None)  Pharyngeal - Ice Chips (None)  Penetration/Aspiration details (ice  chips) (None)  Pharyngeal - Thin Teaspoon Delayed swallow initiation;Premature  spillage to valleculae;Premature spillage to pyriform sinuses  Penetration/Aspiration details (thin teaspoon) (None)  Pharyngeal - Thin Cup Delayed swallow initiation;Premature  spillage to valleculae;Premature spillage to pyriform  sinuses;Pharyngeal residue - valleculae;Pharyngeal residue -  pyriform sinuses  Penetration/Aspiration details (thin cup) (None)  Pharyngeal - Thin Straw Delayed swallow initiation;Premature  spillage to valleculae;Premature spillage to pyriform  sinuses;Penetration/Aspiration during swallow;Pharyngeal residue  -  valleculae;Pharyngeal residue - pyriform sinuses  Penetration/Aspiration details (thin straw) Material enters  airway, remains ABOVE vocal cords then ejected out  Pharyngeal - Thin Syringe (None)  Penetration/Aspiration details (thin syringe') (None)  Pharyngeal - Puree Delayed swallow initiation;Premature spillage  to valleculae  Penetration/Aspiration details (puree) (None)  Pharyngeal - Mechanical Soft (None)  Penetration/Aspiration details (mechanical soft) (None)  Pharyngeal - Regular Delayed swallow initiation;Premature  spillage to valleculae;Premature spillage to pyriform sinuses  Penetration/Aspiration details (regular) (None)  Pharyngeal - Multi-consistency (None)  Penetration/Aspiration details (multi-consistency) (None)  Pharyngeal - Pill (None)  Penetration/Aspiration details (pill) (None)  Pharyngeal Comment (None)     CHL IP CERVICAL ESOPHAGEAL PHASE 12/26/2014  Cervical Esophageal Phase WFL  Pudding Teaspoon (None)  Pudding Cup (None)  Honey Teaspoon (None)  Honey Cup (None)  Honey Syringe (None)  Nectar Teaspoon (None)  Nectar Cup (None)  Nectar Straw (None)  Nectar Syringe (None)  Thin Teaspoon (None)  Thin Cup (None)  Thin Straw (None)  Thin Syringe (None)  Cervical Esophageal Comment (None)    No flowsheet data found.        Thank you,  Genene Churn, [place]   [place] 12/26/2014, 7:22 PM       ASSESSMENT AND PLAN:  Anemia of chronic renal failure, stage 4 (severe) On aranesp 60 mcg every 2 weeks with response to therapy.  Labs every 2 weeks: CBC diff.  Continue with Aranesp 60 mcg every 2 weeks.  Return in 12 weeks for follow-up.   Iron deficiency anemia Requiring IV iron periodically.  Labs every 4 weeks: iron/TIBC, ferritin.     Anemia associated with acute blood loss Requiring IV iron periodically.   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     THERAPY PLAN:  Continue with supportive therapy plan consisting of Aranesp 60 mc every 2 weeks.  Will monitor iron studies and support iron as needed with IV iron.  All questions were answered. The patient knows to call the clinic with any problems, questions or concerns. We can certainly see the patient much sooner if necessary.  Patient and plan discussed with Dr. Ancil Linsey and she is in agreement with the aforementioned.   KEFALAS,THOMAS 01/22/2015

## 2015-01-21 NOTE — Assessment & Plan Note (Signed)
Resulting in anemia secondary to renal disease.

## 2015-01-21 NOTE — Assessment & Plan Note (Signed)
Requiring IV iron periodically.

## 2015-01-21 NOTE — Assessment & Plan Note (Signed)
On aranesp 60 mcg every 2 weeks with response to therapy.  Labs every 2 weeks: CBC diff.  Continue with Aranesp 60 mcg every 2 weeks.  Return in 12 weeks for follow-up.

## 2015-01-21 NOTE — Assessment & Plan Note (Signed)
Requiring IV iron periodically.  Labs every 4 weeks: iron/TIBC, ferritin.

## 2015-01-22 ENCOUNTER — Encounter (HOSPITAL_BASED_OUTPATIENT_CLINIC_OR_DEPARTMENT_OTHER): Payer: Medicare Other | Admitting: Oncology

## 2015-01-22 ENCOUNTER — Encounter (HOSPITAL_COMMUNITY): Payer: Self-pay | Admitting: Oncology

## 2015-01-22 VITALS — BP 141/68 | HR 78 | Temp 97.4°F | Resp 20 | Wt 136.4 lb

## 2015-01-22 DIAGNOSIS — D509 Iron deficiency anemia, unspecified: Secondary | ICD-10-CM | POA: Diagnosis not present

## 2015-01-22 DIAGNOSIS — N184 Chronic kidney disease, stage 4 (severe): Secondary | ICD-10-CM

## 2015-01-22 DIAGNOSIS — D62 Acute posthemorrhagic anemia: Secondary | ICD-10-CM

## 2015-01-22 DIAGNOSIS — D631 Anemia in chronic kidney disease: Secondary | ICD-10-CM

## 2015-01-22 MED ORDER — DARBEPOETIN ALFA 60 MCG/0.3ML IJ SOSY
60.0000 ug | PREFILLED_SYRINGE | Freq: Once | INTRAMUSCULAR | Status: AC
  Administered 2015-01-22: 60 ug via SUBCUTANEOUS

## 2015-01-22 MED ORDER — DARBEPOETIN ALFA 60 MCG/0.3ML IJ SOSY
PREFILLED_SYRINGE | INTRAMUSCULAR | Status: AC
  Filled 2015-01-22: qty 0.3

## 2015-01-22 NOTE — Progress Notes (Signed)
[name] presents today for injection per MD orders. Aranesp 60 mcg administered SQ in left Abdomen. Administration without incident. Patient tolerated well.

## 2015-01-22 NOTE — Addendum Note (Signed)
Addended by: Berneta Levins on: 01/22/2015 04:46 PM   Modules accepted: Orders

## 2015-01-22 NOTE — Patient Instructions (Signed)
[place] at [facility] Discharge Instructions  RECOMMENDATIONS MADE BY THE CONSULTANT AND ANY TEST RESULTS WILL BE SENT TO YOUR REFERRING PHYSICIAN.  Aranesp 60 mcg injection today. Lab work and aranesp injection every 2 weeks. MD appointment in 12 weeks.  Thank you for choosing [place] at [facility] to provide your oncology and hematology care.  To afford each patient quality time with our provider, please arrive at least 15 minutes before your scheduled appointment time.    You need to re-schedule your appointment should you arrive 10 or more minutes late.  We strive to give you quality time with our providers, and arriving late affects you and other patients whose appointments are after yours.  Also, if you no show three or more times for appointments you may be dismissed from the clinic at the providers discretion.     Again, thank you for choosing [facility].  Our hope is that these requests will decrease the amount of time that you wait before being seen by our physicians.       _____________________________________________________________  Should you have questions after your visit to [facility], please contact our office at [phone] between the hours of 8:30 a.m. and 4:30 p.m.  Voicemails left after 4:30 p.m. will not be returned until the following business day.  For prescription refill requests, have your pharmacy contact our office.

## 2015-02-05 ENCOUNTER — Encounter (HOSPITAL_COMMUNITY): Payer: Medicare Other

## 2015-02-05 ENCOUNTER — Encounter (HOSPITAL_COMMUNITY): Payer: Medicare Other | Attending: Hematology and Oncology

## 2015-02-05 DIAGNOSIS — D631 Anemia in chronic kidney disease: Secondary | ICD-10-CM

## 2015-02-05 DIAGNOSIS — E780 Pure hypercholesterolemia: Secondary | ICD-10-CM | POA: Diagnosis not present

## 2015-02-05 DIAGNOSIS — I129 Hypertensive chronic kidney disease with stage 1 through stage 4 chronic kidney disease, or unspecified chronic kidney disease: Secondary | ICD-10-CM | POA: Diagnosis not present

## 2015-02-05 DIAGNOSIS — D649 Anemia, unspecified: Secondary | ICD-10-CM | POA: Diagnosis present

## 2015-02-05 DIAGNOSIS — Z79899 Other long term (current) drug therapy: Secondary | ICD-10-CM | POA: Insufficient documentation

## 2015-02-05 DIAGNOSIS — E119 Type 2 diabetes mellitus without complications: Secondary | ICD-10-CM | POA: Diagnosis not present

## 2015-02-05 DIAGNOSIS — Z87898 Personal history of other specified conditions: Secondary | ICD-10-CM | POA: Insufficient documentation

## 2015-02-05 DIAGNOSIS — R569 Unspecified convulsions: Secondary | ICD-10-CM | POA: Insufficient documentation

## 2015-02-05 DIAGNOSIS — D62 Acute posthemorrhagic anemia: Secondary | ICD-10-CM

## 2015-02-05 DIAGNOSIS — I509 Heart failure, unspecified: Secondary | ICD-10-CM | POA: Insufficient documentation

## 2015-02-05 DIAGNOSIS — Z8673 Personal history of transient ischemic attack (TIA), and cerebral infarction without residual deficits: Secondary | ICD-10-CM | POA: Insufficient documentation

## 2015-02-05 DIAGNOSIS — I252 Old myocardial infarction: Secondary | ICD-10-CM | POA: Insufficient documentation

## 2015-02-05 DIAGNOSIS — Z794 Long term (current) use of insulin: Secondary | ICD-10-CM | POA: Insufficient documentation

## 2015-02-05 DIAGNOSIS — I2510 Atherosclerotic heart disease of native coronary artery without angina pectoris: Secondary | ICD-10-CM | POA: Insufficient documentation

## 2015-02-05 DIAGNOSIS — Z87891 Personal history of nicotine dependence: Secondary | ICD-10-CM | POA: Insufficient documentation

## 2015-02-05 DIAGNOSIS — N184 Chronic kidney disease, stage 4 (severe): Secondary | ICD-10-CM | POA: Insufficient documentation

## 2015-02-05 DIAGNOSIS — Z951 Presence of aortocoronary bypass graft: Secondary | ICD-10-CM | POA: Insufficient documentation

## 2015-02-05 DIAGNOSIS — F0390 Unspecified dementia without behavioral disturbance: Secondary | ICD-10-CM | POA: Diagnosis not present

## 2015-02-05 DIAGNOSIS — D509 Iron deficiency anemia, unspecified: Secondary | ICD-10-CM

## 2015-02-05 LAB — CBC WITH DIFFERENTIAL/PLATELET
BASOS PCT: 0 % (ref 0–1)
Basophils Absolute: 0 10*3/uL (ref 0.0–0.1)
EOS PCT: 0 % (ref 0–5)
Eosinophils Absolute: 0 10*3/uL (ref 0.0–0.7)
HCT: 33.5 % — ABNORMAL LOW (ref 36.0–46.0)
Hemoglobin: 9.9 g/dL — ABNORMAL LOW (ref 12.0–15.0)
LYMPHS PCT: 20 % (ref 12–46)
Lymphs Abs: 1.1 10*3/uL (ref 0.7–4.0)
MCH: 25.9 pg — ABNORMAL LOW (ref 26.0–34.0)
MCHC: 29.6 g/dL — AB (ref 30.0–36.0)
MCV: 87.7 fL (ref 78.0–100.0)
Monocytes Absolute: 0.4 10*3/uL (ref 0.1–1.0)
Monocytes Relative: 7 % (ref 3–12)
NEUTROS PCT: 73 % (ref 43–77)
Neutro Abs: 4.1 10*3/uL (ref 1.7–7.7)
PLATELETS: 196 10*3/uL (ref 150–400)
RBC: 3.82 MIL/uL — ABNORMAL LOW (ref 3.87–5.11)
RDW: 23.4 % — ABNORMAL HIGH (ref 11.5–15.5)
WBC: 5.6 10*3/uL (ref 4.0–10.5)

## 2015-02-05 MED ORDER — DARBEPOETIN ALFA 60 MCG/0.3ML IJ SOSY
60.0000 ug | PREFILLED_SYRINGE | Freq: Once | INTRAMUSCULAR | Status: AC
  Administered 2015-02-05: 60 ug via SUBCUTANEOUS

## 2015-02-05 MED ORDER — DARBEPOETIN ALFA 60 MCG/0.3ML IJ SOSY
PREFILLED_SYRINGE | INTRAMUSCULAR | Status: AC
  Filled 2015-02-05: qty 0.3

## 2015-02-05 NOTE — Progress Notes (Signed)
[name] presents today for injection per MD orders. Aranesp 60 mcg administered SQ in left Abdomen. Administration without incident. Patient tolerated well.

## 2015-02-05 NOTE — Patient Instructions (Signed)
[place] at [facility] Discharge Instructions  RECOMMENDATIONS MADE BY THE CONSULTANT AND ANY TEST RESULTS WILL BE SENT TO YOUR REFERRING PHYSICIAN.  Aranesp 60 mcg injection today as ordered. Return as scheduled.  Thank you for choosing [place] at [facility] to provide your oncology and hematology care.  To afford each patient quality time with our provider, please arrive at least 15 minutes before your scheduled appointment time.    You need to re-schedule your appointment should you arrive 10 or more minutes late.  We strive to give you quality time with our providers, and arriving late affects you and other patients whose appointments are after yours.  Also, if you no show three or more times for appointments you may be dismissed from the clinic at the providers discretion.     Again, thank you for choosing [facility].  Our hope is that these requests will decrease the amount of time that you wait before being seen by our physicians.       _____________________________________________________________  Should you have questions after your visit to [facility], please contact our office at [phone] between the hours of 8:30 a.m. and 4:30 p.m.  Voicemails left after 4:30 p.m. will not be returned until the following business day.  For prescription refill requests, have your pharmacy contact our office.

## 2015-02-06 LAB — FERRITIN: FERRITIN: 1159 ng/mL — AB (ref 10–291)

## 2015-02-12 ENCOUNTER — Other Ambulatory Visit: Payer: Self-pay | Admitting: *Deleted

## 2015-02-12 NOTE — Patient Outreach (Signed)
I spoke with Mrs. Desantiago daughter/primary caregiver [name], by phone today when I called for assessment. Vickie reports that when Mrs. Lacina went to [facility] last week on Friday for a scheduled procedure, she had "cardiac arrest". Mrs. Lumadue is still hospitalized in the ICU at [facility] but [name] says she was told that Mrs. Zahradnik will likely be discharged over the next few days.   We scheduled a home visit for next week.   [facility]  [phone]

## 2015-02-19 ENCOUNTER — Encounter (HOSPITAL_COMMUNITY): Payer: Self-pay

## 2015-02-19 ENCOUNTER — Encounter (HOSPITAL_BASED_OUTPATIENT_CLINIC_OR_DEPARTMENT_OTHER): Payer: Medicare Other

## 2015-02-19 DIAGNOSIS — D631 Anemia in chronic kidney disease: Secondary | ICD-10-CM

## 2015-02-19 DIAGNOSIS — N184 Chronic kidney disease, stage 4 (severe): Secondary | ICD-10-CM | POA: Diagnosis present

## 2015-02-19 DIAGNOSIS — D649 Anemia, unspecified: Secondary | ICD-10-CM | POA: Diagnosis not present

## 2015-02-19 DIAGNOSIS — D62 Acute posthemorrhagic anemia: Secondary | ICD-10-CM

## 2015-02-19 DIAGNOSIS — D509 Iron deficiency anemia, unspecified: Secondary | ICD-10-CM

## 2015-02-19 LAB — CBC WITH DIFFERENTIAL/PLATELET
BASOS ABS: 0 10*3/uL (ref 0.0–0.1)
Basophils Relative: 1 % (ref 0–1)
Eosinophils Absolute: 0 10*3/uL (ref 0.0–0.7)
Eosinophils Relative: 1 % (ref 0–5)
HCT: 29.6 % — ABNORMAL LOW (ref 36.0–46.0)
Hemoglobin: 8.9 g/dL — ABNORMAL LOW (ref 12.0–15.0)
Lymphocytes Relative: 20 % (ref 12–46)
Lymphs Abs: 0.9 10*3/uL (ref 0.7–4.0)
MCH: 26.8 pg (ref 26.0–34.0)
MCHC: 30.1 g/dL (ref 30.0–36.0)
MCV: 89.2 fL (ref 78.0–100.0)
MONO ABS: 0.4 10*3/uL (ref 0.1–1.0)
Monocytes Relative: 9 % (ref 3–12)
NEUTROS ABS: 3 10*3/uL (ref 1.7–7.7)
NEUTROS PCT: 70 % (ref 43–77)
Platelets: 232 10*3/uL (ref 150–400)
RBC: 3.32 MIL/uL — ABNORMAL LOW (ref 3.87–5.11)
RDW: 22.6 % — AB (ref 11.5–15.5)
WBC: 4.3 10*3/uL (ref 4.0–10.5)

## 2015-02-19 MED ORDER — DARBEPOETIN ALFA 60 MCG/0.3ML IJ SOSY
60.0000 ug | PREFILLED_SYRINGE | Freq: Once | INTRAMUSCULAR | Status: AC
  Administered 2015-02-19: 60 ug via SUBCUTANEOUS

## 2015-02-19 MED ORDER — DARBEPOETIN ALFA 60 MCG/0.3ML IJ SOSY
PREFILLED_SYRINGE | INTRAMUSCULAR | Status: AC
  Filled 2015-02-19: qty 0.3

## 2015-02-19 NOTE — Patient Instructions (Signed)
[place] at [facility] Discharge Instructions  RECOMMENDATIONS MADE BY THE CONSULTANT AND ANY TEST RESULTS WILL BE SENT TO YOUR REFERRING PHYSICIAN.  You received your Aranesp injection today . Will see you at your next appt. Call for any concerns or questions.  Thank you for choosing [place] at [facility] to provide your oncology and hematology care.  To afford each patient quality time with our provider, please arrive at least 15 minutes before your scheduled appointment time.    You need to re-schedule your appointment should you arrive 10 or more minutes late.  We strive to give you quality time with our providers, and arriving late affects you and other patients whose appointments are after yours.  Also, if you no show three or more times for appointments you may be dismissed from the clinic at the providers discretion.     Again, thank you for choosing [facility].  Our hope is that these requests will decrease the amount of time that you wait before being seen by our physicians.       _____________________________________________________________  Should you have questions after your visit to [facility], please contact our office at [phone] between the hours of 8:30 a.m. and 4:30 p.m.  Voicemails left after 4:30 p.m. will not be returned until the following business day.  For prescription refill requests, have your pharmacy contact our office.

## 2015-02-19 NOTE — Progress Notes (Signed)
[name] presents today for injection per MD orders. Aranesp 15mcg administered SQ in left Abdomen. Administration without incident. Patient tolerated well.

## 2015-02-19 NOTE — Progress Notes (Signed)
Labs drawn

## 2015-02-20 LAB — FERRITIN: FERRITIN: 498 ng/mL — AB (ref 10–291)

## 2015-02-22 ENCOUNTER — Other Ambulatory Visit: Payer: Self-pay | Admitting: *Deleted

## 2015-02-23 ENCOUNTER — Encounter: Payer: Self-pay | Admitting: Adult Health

## 2015-02-23 ENCOUNTER — Ambulatory Visit (INDEPENDENT_AMBULATORY_CARE_PROVIDER_SITE_OTHER): Payer: Medicare Other | Admitting: Adult Health

## 2015-02-23 VITALS — BP 162/70 | HR 76 | Ht 64.0 in | Wt 150.0 lb

## 2015-02-23 DIAGNOSIS — I10 Essential (primary) hypertension: Secondary | ICD-10-CM | POA: Diagnosis not present

## 2015-02-23 MED ORDER — TORSEMIDE 20 MG PO TABS: 40.0000 mg | ORAL_TABLET | Freq: Every day | ORAL | Status: DC

## 2015-02-23 MED ORDER — POTASSIUM CHLORIDE ER 10 MEQ PO TBCR: 10.0000 meq | EXTENDED_RELEASE_TABLET | Freq: Every day | ORAL | Status: DC

## 2015-02-23 NOTE — Progress Notes (Deleted)
Name: [name]    DOB: 07-06-1941  Age: 74 y.o.  MR#: [id number]       PCP:  [name], MD      Insurance: Payor: MEDICARE / Plan: MEDICARE PART A AND B / Product Type: *No Product type* /   CC:    Chief Complaint  Patient presents with  . Congestive Heart Failure    Mixed  . Coronary Artery Disease    CABG    VS Filed Vitals:   02/23/15 1425  BP: 162/70  Pulse: 76  Height: 5\' 4"  (1.626 m)  Weight: 150 lb (68.04 kg)  SpO2: 99%    Weights Current Weight  02/23/15 150 lb (68.04 kg)  01/22/15 136 lb 6.4 oz (61.871 kg)  01/09/15 167 lb 12.3 oz (76.1 kg)    Blood Pressure  BP Readings from Last 3 Encounters:  02/23/15 162/70  02/19/15 146/59  02/05/15 151/78     Admit date:  (Not on file) Last encounter with RMR:  Visit date not found   Allergy Review of patient's allergies indicates no known allergies.  Current Outpatient Prescriptions  Medication Sig Dispense Refill  . calcitRIOL (ROCALTROL) 0.25 MCG capsule Take 1 capsule (0.25 mcg total) by mouth daily. 31 capsule 0  . donepezil (ARICEPT) 10 MG tablet Take 10 mg by mouth at bedtime.     . folic acid (FOLVITE) 1 MG tablet Take 1 mg by mouth every morning.     . hydrALAZINE (APRESOLINE) 25 MG tablet Take 25 mg by mouth.    . insulin detemir (LEVEMIR) 100 UNIT/ML injection Inject 0.08 mLs (8 Units total) into the skin at bedtime. 10 mL 0  . isosorbide mononitrate (IMDUR) 30 MG 24 hr tablet Take 0.5 tablets (15 mg total) by mouth every morning. 30 tablet 0  . levETIRAcetam (KEPPRA) 100 MG/ML solution Take 500 mg by mouth 2 (two) times daily.     Marland Kitchen linagliptin (TRADJENTA) 5 MG TABS tablet Take 5 mg by mouth every morning.     . Maltodextrin-Xanthan Gum (RESOURCE THICKENUP CLEAR) POWD Take 120 g by mouth as needed.    . metoprolol succinate (TOPROL-XL) 100 MG 24 hr tablet Take 50 mg by mouth every morning. Take with or immediately following a meal.    . pantoprazole (PROTONIX) 40 MG tablet Take 40 mg by mouth  every morning.    . pravastatin (PRAVACHOL) 20 MG tablet Take 20 mg by mouth at bedtime.     . sodium bicarbonate 650 MG tablet Take 1,300 mg by mouth 3 (three) times daily.      No current facility-administered medications for this visit.    Discontinued Meds:    Medications Discontinued During This Encounter  Medication Reason  . hydrALAZINE (APRESOLINE) 25 MG tablet Error  . hydrocortisone (ANUSOL-HC) 25 MG suppository Error  . metolazone (ZAROXOLYN) 5 MG tablet Error  . torsemide (DEMADEX) 20 MG tablet Error    Patient Active Problem List   Diagnosis Date Noted  . UTI (urinary tract infection) 01/04/2015  . Anemia of chronic renal failure, stage 4 (severe) 01/02/2015  . Acute on chronic respiratory failure with hypoxia 01/02/2015  . Elevated troponin 01/02/2015  . AKI (acute kidney injury) 12/31/2014  . Dysphagia 12/24/2014  . Seizure 12/23/2014  . Acute renal failure 12/23/2014  . Anemia 12/23/2014  . Hypernatremia   . CKD (chronic kidney disease)   . Swelling of both ankles   . Lower GI bleeding 10/22/2014  . Acute renal failure  superimposed on stage 4 chronic kidney disease 07/22/2014  . Cardiomyopathy, ischemic 07/20/2014  . Severe anemia 07/18/2014  . Choking episode 04/28/2014  . Hypoglycemia 03/16/2014  . CAP (community acquired pneumonia) 03/15/2014  . Severe mitral regurgitation 02/22/2014  . Acute on chronic combined systolic and diastolic congestive heart failure 02/22/2014  . CHF, acute on chronic 02/21/2014  . Dementia 02/21/2014  . CHF (congestive heart failure) 02/21/2014  . Diarrhea 02/20/2014  . Edema of right lower extremity 02/20/2014  . GI bleeding 01/16/2014  . Weakness generalized 01/16/2014  . Anemia associated with acute blood loss 01/16/2014  . NSTEMI (non-ST elevated myocardial infarction) 01/16/2014  . Systolic CHF, chronic last EF 40% 3/13 01/16/2014  . Iron deficiency anemia 01/16/2014  . Bilateral lower extremity edema 07/22/2013   . Hyperglycemia 12/21/2012  . CKD (chronic kidney disease) stage 4, GFR 15-29 ml/min 12/21/2012  . Diabetes 12/21/2012  . Dehydration 12/21/2012  . CVA (cerebral infarction) 04/19/2012  . Neutropenia 01/30/2012  . Seizure disorder 01/25/2012  . ARF (acute renal failure) 01/25/2012  . Acute CVA Left frontal lobe,   . Hypertension   . Diabetes mellitus     LABS    Component Value Date/Time   NA 148* 01/09/2015 0543   NA 149* 01/08/2015 0555   NA 145 01/07/2015 0609   K 4.1 01/09/2015 0543   K 4.1 01/08/2015 0555   K 4.0 01/07/2015 0609   CL 107 01/09/2015 0543   CL 109 01/08/2015 0555   CL 110 01/07/2015 0609   CO2 32 01/09/2015 0543   CO2 33* 01/08/2015 0555   CO2 31 01/07/2015 0609   GLUCOSE 142* 01/09/2015 0543   GLUCOSE 138* 01/08/2015 0555   GLUCOSE 120* 01/07/2015 0609   BUN 72* 01/09/2015 0543   BUN 73* 01/08/2015 0555   BUN 70* 01/07/2015 0609   CREATININE 3.63* 01/09/2015 0543   CREATININE 3.74* 01/08/2015 0555   CREATININE 3.71* 01/07/2015 0609   CALCIUM 8.6 01/09/2015 0543   CALCIUM 8.3* 01/08/2015 0555   CALCIUM 7.9* 01/07/2015 0609   CALCIUM 8.3* 04/28/2014 0815   GFRNONAA 11* 01/09/2015 0543   GFRNONAA 11* 01/08/2015 0555   GFRNONAA 11* 01/07/2015 0609   GFRAA 13* 01/09/2015 0543   GFRAA 13* 01/08/2015 0555   GFRAA 13* 01/07/2015 0609   CMP     Component Value Date/Time   NA 148* 01/09/2015 0543   K 4.1 01/09/2015 0543   CL 107 01/09/2015 0543   CO2 32 01/09/2015 0543   GLUCOSE 142* 01/09/2015 0543   BUN 72* 01/09/2015 0543   CREATININE 3.63* 01/09/2015 0543   CALCIUM 8.6 01/09/2015 0543   CALCIUM 8.3* 04/28/2014 0815   PROT 8.1 12/23/2014 0639   ALBUMIN 2.7* 12/23/2014 0639   AST 35 12/23/2014 0639   ALT 19 12/23/2014 0639   ALKPHOS 119* 12/23/2014 0639   BILITOT 1.1 12/23/2014 0639   GFRNONAA 11* 01/09/2015 0543   GFRAA 13* 01/09/2015 0543       Component Value Date/Time   WBC 4.3 02/19/2015 1230   WBC 5.6 02/05/2015 1345   WBC  5.0 01/15/2015 1316   HGB 8.9* 02/19/2015 1230   HGB 9.9* 02/05/2015 1345   HGB 9.8* 01/15/2015 1316   HCT 29.6* 02/19/2015 1230   HCT 33.5* 02/05/2015 1345   HCT 33.1* 01/15/2015 1316   MCV 89.2 02/19/2015 1230   MCV 87.7 02/05/2015 1345   MCV 85.1 01/15/2015 1316    Lipid Panel     Component Value Date/Time  CHOL 164 04/20/2012 0618   TRIG 129 04/20/2012 0618   HDL 57 04/20/2012 0618   CHOLHDL 2.9 04/20/2012 0618   VLDL 26 04/20/2012 0618   LDLCALC 81 04/20/2012 0618    ABG    Component Value Date/Time   PHART 7.428 01/03/2015 1500   PCO2ART 45.4* 01/03/2015 1500   PO2ART 85.1 01/03/2015 1500   HCO3 29.5* 01/03/2015 1500   TCO2 27.8 01/03/2015 1500   ACIDBASEDEF 5.3* 03/15/2014 1817   O2SAT 96.2 01/03/2015 1500     Lab Results  Component Value Date   TSH 3.720 06/10/2014   BNP (last 3 results)  Recent Labs  01/02/15 1125  BNP >4500.0*    ProBNP (last 3 results)  Recent Labs  03/15/14 1645 04/27/14 1536 06/09/14 1021  PROBNP 37134.0* 51631.0* 30358.0*    Cardiac Panel (last 3 results) No results for input(s): CKTOTAL, CKMB, TROPONINI, RELINDX in the last 72 hours.  Iron/TIBC/Ferritin/ %Sat    Component Value Date/Time   IRON 27* 01/05/2015 0549   TIBC 236* 01/05/2015 0549   FERRITIN 498* 02/19/2015 1230   IRONPCTSAT 11* 01/05/2015 0549     EKG Orders placed or performed during the hospital encounter of 01/02/15  . ED EKG  . ED EKG  . ED EKG  . ED EKG  . EKG 12-Lead  . EKG 12-Lead  . EKG 12-Lead  . EKG 12-Lead  . EKG     Prior Assessment and Plan Problem List as of 02/23/2015      Cardiovascular and Mediastinum   Cardiomyopathy, ischemic   Acute CVA Left frontal lobe,   Hypertension   Last Assessment & Plan 03/08/2014 Office Visit Written 03/08/2014  3:32 PM by Lendon Colonel, NP    Pressure is well-controlled currently. Will not make any changes on her medication regimen at this time. She will see [place] again in 3 months unless  symptomatic.      NSTEMI (non-ST elevated myocardial infarction)   Systolic CHF, chronic last EF 40% 3/13   CHF, acute on chronic   CHF (congestive heart failure)   Last Assessment & Plan 03/08/2014 Office Visit Written 03/08/2014  3:31 PM by Lendon Colonel, NP    She does not appear to have any evidence of fluid overload, lungs are clear. Demadex dose apparently is working well for her at 20 mg daily. I seeing unilateral lower extremity edema on the right. She has had a recent Doppler ultrasound during hospitalization that was negative for DVT. I have advised TED hose and a prescription is been provided for her. She will be seen again in 3 months for ongoing assessment and management.      Severe mitral regurgitation   Acute on chronic combined systolic and diastolic congestive heart failure     Respiratory   CAP (community acquired pneumonia)   Acute on chronic respiratory failure with hypoxia     Digestive   GI bleeding   Lower GI bleeding   Dysphagia     Endocrine   Diabetes mellitus   Diabetes   Hypoglycemia     Nervous and Auditory   Dementia   Seizure disorder   CVA (cerebral infarction)     Genitourinary   ARF (acute renal failure)   CKD (chronic kidney disease) stage 4, GFR 15-29 ml/min   Last Assessment & Plan 01/22/2015 Office Visit Written 01/21/2015  8:50 PM by Baird Cancer, PA-C    Resulting in anemia secondary to renal disease.  Acute renal failure superimposed on stage 4 chronic kidney disease   CKD (chronic kidney disease)   Acute renal failure   AKI (acute kidney injury)   Anemia of chronic renal failure, stage 4 (severe)   Last Assessment & Plan 01/22/2015 Office Visit Written 01/21/2015  8:48 PM by Baird Cancer, PA-C    On aranesp 60 mcg every 2 weeks with response to therapy.  Labs every 2 weeks: CBC diff.  Continue with Aranesp 60 mcg every 2 weeks.  Return in 12 weeks for follow-up.      UTI (urinary tract infection)     Other    Neutropenia   Hyperglycemia   Dehydration   Bilateral lower extremity edema   Weakness generalized   Anemia associated with acute blood loss   Last Assessment & Plan 01/22/2015 Office Visit Written 01/21/2015  8:49 PM by Baird Cancer, PA-C    Requiring IV iron periodically.      Iron deficiency anemia   Last Assessment & Plan 01/22/2015 Office Visit Written 01/21/2015  8:49 PM by Baird Cancer, PA-C    Requiring IV iron periodically.  Labs every 4 weeks: iron/TIBC, ferritin.        Diarrhea   Last Assessment & Plan 02/20/2014 Office Visit Edited 02/20/2014 10:37 AM by Mahala Menghini, PA-C    Diarrhea noted since antibiotic therapy for H. pylori. 2-3 loose to soft stools daily. Suspect antibiotic associated but she is at increased risk of C. difficile. Check C. difficile PCR. Start restora one daily for 30 days.      Edema of right lower extremity   Last Assessment & Plan 02/20/2014 Office Visit Written 02/20/2014 10:27 AM by Mahala Menghini, PA-C    Acute onset right lower lower extremity edema. Noted in the leg that she has had vein grafting for coronary artery bypass surgery. Need to rule out DVT. Plan for Doppler study today.      Choking episode   Severe anemia   Last Assessment & Plan 07/27/2014 Office Visit Edited 07/27/2014 12:05 PM by Danie Binder, MD    REQUIRING pRBCs Q2-3 WEEKS DUE TO Hb DROPPING TO 5-6. ENDOSCOPIC EVALUATION HAS NOT REVEALED THE SOURCE OF BLEEDING. MAY BE DUE TO SML VOLUME OCCULT GI BLOOD LOSS IN SETTING OF RENAL INSUFFICIENCY.  DISCUSSED WITH DR. [place]. WILL SEE PT FOR IVFE AND PROCRIT. REFER TO [facility] FOR OBSCURE GI BLEED/TRANSFUSION DEPENDENT ANEMIA. FAMILY WILL PICK UP GIVENS [place] AND ENDO REPORTS TO TAKE TO VISIT. CLOSE MONITORING OF CBC. TRNASFUSE PRN FOR Hb < 8.0  I PERSONALLY REVIEWED ABD FILMS WITH DR BOLES AND NO CAPSULE IN BOWEL AND POSSIBLE LEFT KIDNEY STONE. FOLLOW UP IN 3 MOS.        Swelling of both ankles   Seizure   Anemia    Hypernatremia   Elevated troponin       Imaging: No results found.

## 2015-02-23 NOTE — Progress Notes (Signed)
Cardiology Office Note   Date:  02/23/2015   ID:  Caylan, Chenard 1941/04/13, MRN [id number]  PCP:  [name], MD  Cardiologist:  Cloria Spring, NP   Chief Complaint  Patient presents with  . Congestive Heart Failure    Mixed  . Coronary Artery Disease    CABG      History of Present Illness: [name] is a 74 y.o. female who presents for and assessment and management of chronic mixed CHF, with LVEF of 4045% per echo in February 8177, grade 1 diastolic dysfunction, severe MR, but a poor surgical candidate due to medical comorbidities, and dementia, CAD, with prior CABG in 2002.  The patient is here on followup for ongoing treatment.  She is being followed by [facility] as outpatient, just had home health speech therapy, and physical therapy.  Other history includes anemia of chronic disease.  She has had multiple hospitalizations over the last 2 months for recurrent renal failure, anemia, and fluid retention.  Since release from the hospital.  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  These were repaired.  Was also noted during the procedure as she had the scope removed that she had a cardiac arrest.  The patient was given CPR, but did not need to be intubated.  Chest x-ray revealed pulmonary edema possible aspiration pneumonia.  She was seen by cardiology . Echocardiogram was completed on 02/12/2015: The left ventricular size was normal, there is normal.  Left ventricular wall thickness, LV systolic function was mild to moderately reduced with an EF of 35-45%.  Regional wall motion abnormalities.  The left atrial size was normal, the right atrium was mildly dilated, there was moderate mitral regurg.  The regurgitant jet is posteriorly directed.  There is mild tricuspid regurg.  There was no pericardial effusion.  Right ventricular systolic function  is moderately reduced, the right ventricle is borderline dilated.  (Please see full echo report on care everywhere.) a CT scan was also completed due to confusion.  This revealed remote multifocal basal ganglia lacunar infarcts.  She will sign age indeterminate right cerebellar infarct.  There was hypoattenuation, and loss of gray-white, branch, and in the right parietal, occipital lobe without associated volume loss, suggesting acute versus early subacute infarct.  Review of labs revealed that her creatinine on discharge was 4.25.  Sodium 145, potassium 4.4, chloride 108, CO2 29, BUN 61, glucose 1 80.  Labs on discharge, hemoglobin 9.1, hematocrit 29.7, white blood cells, 6.4, platelets 150.  On discharge, the patient was taken off of torsemide, potassium, and sodium bicarbonate.  She was to followup with cardiology, nephrology, and her primary care physician.  Since release.  She has gained approximately 20 pounds, has had fluid retention in her abdomen.  She denies worsening symptoms, but her daughter, who cares for her has noticed increased abdominal girth.  There is no worsening breathing status, although she is on oxygen portable.  The patient denies any discomfort, although she has dementia.  She is able to express pain and other symptoms.  She is eating and drinking, her daughter is avoiding salt in her meals.    Past Medical History  Diagnosis Date  . Type 2 diabetes mellitus   . Essential hypertension, benign   . History of stroke     Previously on Coumadin  . 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 Seizure disorder   . Coronary atherosclerosis of native coronary artery     Multivessel status post CABG 2002  . History of GI bleed     Erosive gastritis and duodenitis by EGD 2002  . Mixed hyperlipidemia   . Ischemic cardiomyopathy     LVEF 40-45% March 2013  . Dementia   . CKD (chronic kidney disease) stage 4, GFR 15-29 ml/min   . CHF (congestive heart failure)   . Chronic respiratory failure  with hypoxia   . Stroke   . Seizures   . Anemia of chronic renal failure, stage 4 (severe) 01/02/2015    Past Surgical History  Procedure Laterality Date  . Coronary artery bypass graft  July 2002    LIMA to LAD, SVG to OM1, SVG to OM 2, SVG to RCA  . Tee without cardioversion  01/28/2012    Procedure: TRANSESOPHAGEAL ECHOCARDIOGRAM (TEE);  Surgeon: Lelon Perla, MD;  Location: [facility] ENDOSCOPY;  Service: Cardiovascular;  Laterality: [name];  . Colonoscopy  2002  . Esophagogastroduodenoscopy  2002  . Esophagogastroduodenoscopy [name] 01/18/2014    UEA:VWUJW hiatal hernia. Abnormal gastric and duodenal bulbar mucosa of uncertain significance - status post gastric bx (chronic gastritis/H.pylori +  . Colonoscopy [name] 06/25/2014    Dr. Laural Golden: tubular adenoma, pandiverticulosis.   . Agile capsule [name] 07/06/2014    Procedure: AGILE CAPSULE;  Surgeon: Danie Binder, MD;  Location[facility] END[facility];  Service: Endoscopy;  Laterality: [name];  730  . Esophagogastroduodenoscopy [name] 07/20/2014    Dr. Gala Romney: tiny gastric polyps not manipulated. minimal pigmentation of duodenal bulb likely not significant  . Givens capsule study [name] 07/20/2014    incomplete     Current Outpatient Prescriptions  Medication Sig Dispense Refill  . calcitRIOL (ROCALTROL) 0.25 MCG capsule Take 1 capsule (0.25 mcg total) by mouth daily. 31 capsule 0  . donepezil (ARICEPT) 10 MG tablet Take 10 mg by mouth at bedtime.     . folic acid (FOLVITE) 1 MG tablet Take 1 mg by mouth every morning.     . hydrALAZINE (APRESOLINE) 25 MG tablet Take 3 tablets (75 mg total) by mouth every 8 (eight) hours.    . hydrALAZINE (APRESOLINE) 25 MG tablet Take 25 mg by mouth.    . hydrocortisone (ANUSOL-HC) 25 MG suppository Place 1 suppository (25 mg total) rectally 2 (two) times daily. (Patient not taking: Reported on 01/22/2015) 12 suppository 0  . insulin detemir (LEVEMIR) 100 UNIT/ML injection Inject 0.08 mLs (8 Units total) into the skin at bedtime. 10 mL  0  . isosorbide mononitrate (IMDUR) 30 MG 24 hr tablet Take 0.5 tablets (15 mg total) by mouth every morning. 30 tablet 0  . levETIRAcetam (KEPPRA) 100 MG/ML solution Take 500 mg by mouth.    . linagliptin (TRADJENTA) 5 MG TABS tablet Take 5 mg by mouth every morning.     . Maltodextrin-Xanthan Gum (RESOURCE THICKENUP CLEAR) POWD Take 120 g by mouth as needed.    . metolazone (ZAROXOLYN) 5 MG tablet Take 1 tablet (5 mg total) by mouth 2 (two) times daily. (Patient not taking: Reported on 01/22/2015) 60 tablet 0  . metoprolol succinate (TOPROL-XL) 100 MG 24 hr tablet Take 50 mg by mouth every morning. Take with or immediately following a meal.    . pantoprazole (PROTONIX) 40 MG tablet Take 40 mg by mouth every morning.    . pravastatin (PRAVACHOL) 20 MG tablet Take 20 mg by mouth at bedtime.     Marland Kitchen  sodium bicarbonate 650 MG tablet Take 1,300 mg by mouth 3 (three) times daily.     Marland Kitchen torsemide (DEMADEX) 20 MG tablet Take 3 tablets (60 mg total) by mouth daily. 90 tablet 0   No current facility-administered medications for this visit.    Allergies:   Review of patient's allergies indicates no known allergies.    Social History:  The patient  reports that she has quit smoking. Her smoking use included Cigarettes. She has a 50 pack-year smoking history. She has never used smokeless tobacco. She reports that she does not drink alcohol or use illicit drugs.   Family History:  The patient's family history includes Diabetes type II in her mother; Stroke in her father. There is no history of Colon cancer.    ROS: .   All other systems are reviewed and negative.Unless otherwise mentioned in  H&P above.   PHYSICAL EXAM: VS:  There were no vitals taken for this visit. , BMI There is no weight on file to calculate BMI. GEN: Well nourished, well developed, in no acute distress HEENT: normal Neck: no JVD, carotid bruits, or masses Cardiac: RRR, distant,; no murmurs, rubs, or gallops,no edema   Respiratory:  Clear to auscultation bilaterally, normal work of breathing GI: soft, nontender, Distended, + BS, normal, bowel sounds. MS: no deformity or atrophy Skin: warm and dry, no rash Neuro:  Strength and sensation are intact Psych: Flat affect, answers questions, but has poor memory   Recent Labs: 06/09/2014: Pro B Natriuretic peptide (BNP) 30358.0* 06/10/2014: TSH 3.720 10/23/2014: Magnesium 2.0 12/23/2014: ALT 19 01/02/2015: B Natriuretic Peptide >4500.0* 01/09/2015: BUN 72*; Creatinine 3.63*; Potassium 4.1; Sodium 148* 02/19/2015: Hemoglobin 8.9*; Platelets 232    Lipid Panel    Component Value Date/Time   CHOL 164 04/20/2012 0618   TRIG 129 04/20/2012 0618   HDL 57 04/20/2012 0618   CHOLHDL 2.9 04/20/2012 0618   VLDL 26 04/20/2012 0618   LDLCALC 81 04/20/2012 0618      Wt Readings from Last 3 Encounters:  01/22/15 136 lb 6.4 oz (61.871 kg)  01/09/15 167 lb 12.3 oz (76.1 kg)  12/31/14 169 lb 12.1 oz (77 kg)      Other studies Reviewed: Additional studies/ records that were reviewed today include: Care Everywhere Records from [facility].  Review of the above records demonstrates: Discussed above.    ASSESSMENT AND PLAN:  1. Acute on Chronic Systolic CHF: She was taken off of torsemide, bicarbonate, and potassium on discharge from [facility] has gained 20 pounds, has significant abdominal distention.  Her baseline dose was 60 mg daily.  I will begin her on torsemide 40 mg daily, place her on 10 mEq of potassium daily.  We will repeat her be met in one week.  She is to continue daily weights.  She has home health nurses see her, along with physical therapist.  The patient will be monitored closely with reporting in from home health nurse should symptoms worsen, she does not lose weight, or new problems occur.  She is yet to followup with her nephrologist, Dr.Befakadu.  2. Anemia: Felt to be blood loss.  Was seen at [facility] had an EGD  with multiple AVMs, which were repaired. She is to followup with GI here in [place] for ongoing assessment and management.  3. CKD: review of most recent labs demonstrated a creatinine of 4.25 on 02/13/2015.  She will need to followup with Dr. Hinda Lenis for ongoing management.  Current medicines  are reviewed at length with the patient today.    Labs/ tests ordered today include: BMET one week No orders of the defined types were placed in this encounter.     Disposition:   FU with JB or KL in 2 weeks.   Signed, Jory Sims, NP  02/23/2015 7:19 AM    [facility] [address], [place], [place] [address] Phone: [phone]; Fax: [phone]

## 2015-02-23 NOTE — Patient Instructions (Addendum)
Your physician recommends that you schedule a follow-up appointment in: 2 weeks   Your physician recommends that you weigh, daily, at the same time every day, and in the same amount of clothing. Please record your daily weights on the handout provided and bring it to your next appointment.    Your physician recommends that you return for lab work in: 1 week 03/02/15 ( BMET)  Thank you for choosing [facility]!

## 2015-02-27 ENCOUNTER — Other Ambulatory Visit (HOSPITAL_COMMUNITY): Payer: Self-pay

## 2015-03-05 ENCOUNTER — Encounter (HOSPITAL_COMMUNITY): Payer: Medicare Other | Attending: Hematology and Oncology

## 2015-03-05 ENCOUNTER — Encounter (HOSPITAL_COMMUNITY): Payer: Medicare Other

## 2015-03-05 ENCOUNTER — Encounter (HOSPITAL_COMMUNITY): Payer: Self-pay

## 2015-03-05 DIAGNOSIS — Z8673 Personal history of transient ischemic attack (TIA), and cerebral infarction without residual deficits: Secondary | ICD-10-CM | POA: Diagnosis not present

## 2015-03-05 DIAGNOSIS — Z794 Long term (current) use of insulin: Secondary | ICD-10-CM | POA: Insufficient documentation

## 2015-03-05 DIAGNOSIS — F0390 Unspecified dementia without behavioral disturbance: Secondary | ICD-10-CM | POA: Insufficient documentation

## 2015-03-05 DIAGNOSIS — Z951 Presence of aortocoronary bypass graft: Secondary | ICD-10-CM | POA: Insufficient documentation

## 2015-03-05 DIAGNOSIS — Z79899 Other long term (current) drug therapy: Secondary | ICD-10-CM | POA: Diagnosis not present

## 2015-03-05 DIAGNOSIS — R569 Unspecified convulsions: Secondary | ICD-10-CM | POA: Diagnosis not present

## 2015-03-05 DIAGNOSIS — I2510 Atherosclerotic heart disease of native coronary artery without angina pectoris: Secondary | ICD-10-CM | POA: Insufficient documentation

## 2015-03-05 DIAGNOSIS — D631 Anemia in chronic kidney disease: Secondary | ICD-10-CM | POA: Diagnosis not present

## 2015-03-05 DIAGNOSIS — D509 Iron deficiency anemia, unspecified: Secondary | ICD-10-CM

## 2015-03-05 DIAGNOSIS — D649 Anemia, unspecified: Secondary | ICD-10-CM | POA: Diagnosis not present

## 2015-03-05 DIAGNOSIS — I252 Old myocardial infarction: Secondary | ICD-10-CM | POA: Diagnosis not present

## 2015-03-05 DIAGNOSIS — Z87891 Personal history of nicotine dependence: Secondary | ICD-10-CM | POA: Insufficient documentation

## 2015-03-05 DIAGNOSIS — D62 Acute posthemorrhagic anemia: Secondary | ICD-10-CM

## 2015-03-05 DIAGNOSIS — E780 Pure hypercholesterolemia: Secondary | ICD-10-CM | POA: Diagnosis not present

## 2015-03-05 DIAGNOSIS — I509 Heart failure, unspecified: Secondary | ICD-10-CM | POA: Insufficient documentation

## 2015-03-05 DIAGNOSIS — I129 Hypertensive chronic kidney disease with stage 1 through stage 4 chronic kidney disease, or unspecified chronic kidney disease: Secondary | ICD-10-CM | POA: Diagnosis not present

## 2015-03-05 DIAGNOSIS — N184 Chronic kidney disease, stage 4 (severe): Secondary | ICD-10-CM | POA: Diagnosis not present

## 2015-03-05 DIAGNOSIS — Z87898 Personal history of other specified conditions: Secondary | ICD-10-CM | POA: Insufficient documentation

## 2015-03-05 DIAGNOSIS — E119 Type 2 diabetes mellitus without complications: Secondary | ICD-10-CM | POA: Diagnosis not present

## 2015-03-05 LAB — CBC WITH DIFFERENTIAL/PLATELET
Basophils Absolute: 0 10*3/uL (ref 0.0–0.1)
Basophils Relative: 0 % (ref 0–1)
Eosinophils Absolute: 0.1 10*3/uL (ref 0.0–0.7)
Eosinophils Relative: 1 % (ref 0–5)
HCT: 29.6 % — ABNORMAL LOW (ref 36.0–46.0)
Hemoglobin: 9.1 g/dL — ABNORMAL LOW (ref 12.0–15.0)
LYMPHS ABS: 1.3 10*3/uL (ref 0.7–4.0)
LYMPHS PCT: 27 % (ref 12–46)
MCH: 27.8 pg (ref 26.0–34.0)
MCHC: 30.7 g/dL (ref 30.0–36.0)
MCV: 90.5 fL (ref 78.0–100.0)
MONO ABS: 0.6 10*3/uL (ref 0.1–1.0)
MONOS PCT: 13 % — AB (ref 3–12)
NEUTROS ABS: 2.9 10*3/uL (ref 1.7–7.7)
Neutrophils Relative %: 59 % (ref 43–77)
Platelets: 163 10*3/uL (ref 150–400)
RBC: 3.27 MIL/uL — ABNORMAL LOW (ref 3.87–5.11)
RDW: 21.6 % — ABNORMAL HIGH (ref 11.5–15.5)
WBC: 4.9 10*3/uL (ref 4.0–10.5)

## 2015-03-05 MED ORDER — DARBEPOETIN ALFA 60 MCG/0.3ML IJ SOSY
PREFILLED_SYRINGE | INTRAMUSCULAR | Status: AC
  Filled 2015-03-05: qty 0.3

## 2015-03-05 MED ORDER — DARBEPOETIN ALFA 60 MCG/0.3ML IJ SOSY
60.0000 ug | PREFILLED_SYRINGE | Freq: Once | INTRAMUSCULAR | Status: AC
  Administered 2015-03-05: 60 ug via SUBCUTANEOUS

## 2015-03-05 NOTE — Patient Instructions (Signed)
[place] at [facility]  Discharge Instructions:  You had blood work today and aranesp.  Follow up as scheduled.  Call the clinic if you have any questions or concerns _______________________________________________________________  Thank you for choosing [place] at [facility] to provide your oncology and hematology care.  To afford each patient quality time with our providers, please arrive at least 15 minutes before your scheduled appointment.  You need to re-schedule your appointment if you arrive 10 or more minutes late.  We strive to give you quality time with our providers, and arriving late affects you and other patients whose appointments are after yours.  Also, if you no show three or more times for appointments you may be dismissed from the clinic.  Again, thank you for choosing [place] at [place] hope is that these requests will allow you access to exceptional care and in a timely manner. _______________________________________________________________  If you have questions after your visit, please contact our office at [phone] between the hours of 8:30 a.m. and 5:00 p.m. Voicemails left after 4:30 p.m. will not be returned until the following business day. _______________________________________________________________  For prescription refill requests, have your pharmacy contact our office. _______________________________________________________________  Recommendations made by the consultant and any test results will be sent to your referring physician. _______________________________________________________________

## 2015-03-05 NOTE — Progress Notes (Signed)
Emmamae Mcnamara Lovecchio's reason for visit today is for an injection and labs as scheduled per MD orders.  Labs were drawn prior to administration of ordered medication.  Venipuncture performed with a 23 gauge butterfly needle to L Antecubital.  Ofilia Neas Schaum also received aranesp 60 mcg in abdomen sq per MD orders; see [facility] for administration details.  Daila Elbert Brockway tolerated all procedures well and without incident; questions were answered and patient was discharged.

## 2015-03-06 LAB — FERRITIN: Ferritin: 334 ng/mL — ABNORMAL HIGH (ref 10–291)

## 2015-03-09 ENCOUNTER — Encounter: Payer: Self-pay | Admitting: Adult Health

## 2015-03-09 ENCOUNTER — Ambulatory Visit (INDEPENDENT_AMBULATORY_CARE_PROVIDER_SITE_OTHER): Payer: Medicare Other | Admitting: Adult Health

## 2015-03-09 VITALS — BP 140/66 | HR 73 | Ht 64.0 in | Wt 148.0 lb

## 2015-03-09 DIAGNOSIS — I10 Essential (primary) hypertension: Secondary | ICD-10-CM | POA: Diagnosis not present

## 2015-03-09 DIAGNOSIS — I5032 Chronic diastolic (congestive) heart failure: Secondary | ICD-10-CM

## 2015-03-09 MED ORDER — TORSEMIDE 20 MG PO TABS: 60.0000 mg | ORAL_TABLET | Freq: Every day | ORAL | Status: DC

## 2015-03-09 NOTE — Patient Instructions (Signed)
Your physician recommends that you schedule a follow-up appointment in: 1 month   Your physician recommends that you return for lab work on Monday ( BMET)   Your physician has recommended you make the following change in your medication:   Increase Torsemide to 60 mg Daily  Thank you for choosing [facility]!

## 2015-03-09 NOTE — Progress Notes (Signed)
Cardiology Office Note   Date:  03/09/2015   ID:  [name], [name] 1941/02/04, MRN [id number]  PCP:  [name], MD  Cardiologist:  Cloria Spring, NP   Chief Complaint  Patient presents with  . Congestive Heart Failure  . Coronary Artery Disease      History of Present Illness: [name] is a 74 y.o. female who presents for ongoing assessment and management of mixed CHF, and CAD, who is recently seen in the office on 02/23/2015.  Most recent echocardiogram in March of 2016 demonstrated EF of 35-40 5% with some regional wall motion abnormalities.  [name] has a history of basal cannula.  Lacunar infarcts.  She was last seen in the office on 02/23/1999 16, with complaints of weight gain of approximately 20 pounds and has had fluid retention in her abdomen.  She has dementia, but was able to express that she is not having any pain or any other symptoms.  The patient was started on torsemide 40 mg daily and 10 mEq of potassium daily.  She will have repeat BMET prior to the office visit.  She is to continue daily weights.  She is being seen by home health nurses along with the physical therapist.  If symptoms worsen or she did not lose weight.  She was to call us.  Unfortunate labs were not completed as ordered.  She did have a CBC on 03/05/2015, which demonstrated hemoglobin of 9.1, hematocrit of 29.6, white blood cells of 4.9, with platelets of 163.  Should reorder BMET today.  Today only losing 2 pounds.  She continues to have some abdominal distention.  She denies any pain or dyspnea.  Her daughter states that she is eating and drinking well and they are doing her best to avoid salty foods.  Home, weight is 143 pounds.  Today she is 148 pounds  Past Medical History  Diagnosis Date  . Type 2 diabetes mellitus   . Essential hypertension, benign   . History of stroke     Previously on Coumadin  . MI, old     Reported 38  . Seizure disorder   . Coronary  atherosclerosis of native coronary artery     Multivessel status post CABG 2002  . History of GI bleed     Erosive gastritis and duodenitis by EGD 2002  . Mixed hyperlipidemia   . Ischemic cardiomyopathy     LVEF 40-45% March 2013  . Dementia   . CKD (chronic kidney disease) stage 4, GFR 15-29 ml/min   . CHF (congestive heart failure)   . Chronic respiratory failure with hypoxia   . Stroke   . Seizures   . Anemia of chronic renal failure, stage 4 (severe) 01/02/2015    Past Surgical History  Procedure Laterality Date  . Coronary artery bypass graft  July 2002    LIMA to LAD, SVG to OM1, SVG to OM 2, SVG to RCA  . Tee without cardioversion  01/28/2012    Procedure: TRANSESOPHAGEAL ECHOCARDIOGRAM (TEE);  Surgeon: Lelon Perla, MD;  Location: [facility] ENDOSCOPY;  Service: Cardiovascular;  Laterality: [name];  . Colonoscopy  2002  . Esophagogastroduodenoscopy  2002  . Esophagogastroduodenoscopy [name] 01/18/2014    IWL:NLGXQ hiatal hernia. Abnormal gastric and duodenal bulbar mucosa of uncertain significance - status post gastric bx (chronic gastritis/H.pylori +  . Colonoscopy [name] 06/25/2014    Dr. Laural Golden: tubular adenoma, pandiverticulosis.   . Agile capsule [name] 07/06/2014    Procedure: AGILE CAPSULE;  Surgeon: Danie Binder, MD;  Location: [facility] ENDO [facility];  Service: Endoscopy;  Laterality: [name];  730  . Esophagogastroduodenoscopy [name] 07/20/2014    Dr. Gala Romney: tiny gastric polyps not manipulated. minimal pigmentation of duodenal bulb likely not significant  . Givens capsule study [name] 07/20/2014    incomplete     Current Outpatient Prescriptions  Medication Sig Dispense Refill  . calcitRIOL (ROCALTROL) 0.25 MCG capsule Take 1 capsule (0.25 mcg total) by mouth daily. 31 capsule 0  . donepezil (ARICEPT) 10 MG tablet Take 10 mg by mouth at bedtime.     . folic acid (FOLVITE) 1 MG tablet Take 1 mg by mouth every morning.     . hydrALAZINE (APRESOLINE) 25 MG tablet Take 25 mg by mouth.    . insulin  detemir (LEVEMIR) 100 UNIT/ML injection Inject 0.08 mLs (8 Units total) into the skin at bedtime. 10 mL 0  . isosorbide mononitrate (IMDUR) 30 MG 24 hr tablet Take 0.5 tablets (15 mg total) by mouth every morning. 30 tablet 0  . levETIRAcetam (KEPPRA) 100 MG/ML solution Take 500 mg by mouth 2 (two) times daily.     Marland Kitchen linagliptin (TRADJENTA) 5 MG TABS tablet Take 5 mg by mouth every morning.     . Maltodextrin-Xanthan Gum (RESOURCE THICKENUP CLEAR) POWD Take 120 g by mouth as needed.    . metolazone (ZAROXOLYN) 5 MG tablet     . metoprolol succinate (TOPROL-XL) 100 MG 24 hr tablet Take 50 mg by mouth every morning. Take with or immediately following a meal.    . mupirocin ointment (BACTROBAN) 2 %     . pantoprazole (PROTONIX) 40 MG tablet Take 40 mg by mouth every morning.    . potassium chloride (K-DUR) 10 MEQ tablet Take 1 tablet (10 mEq total) by mouth daily. 90 tablet 3  . pravastatin (PRAVACHOL) 20 MG tablet Take 20 mg by mouth at bedtime.     . torsemide (DEMADEX) 20 MG tablet Take 3 tablets (60 mg total) by mouth daily. 90 tablet 11   No current facility-administered medications for this visit.    Allergies:   Review of patient's allergies indicates no known allergies.    Social History:  The patient  reports that she quit smoking about 3 years ago. Her smoking use included Cigarettes. She started smoking about 54 years ago. She has a 50 pack-year smoking history. She has never used smokeless tobacco. She reports that she does not drink alcohol or use illicit drugs.   Family History:  The patient's family history includes Diabetes type II in her mother; Stroke in her father. There is no history of Colon cancer.    ROS: .   All other systems are reviewed and negative.Unless otherwise mentioned in H&P above.   PHYSICAL EXAM: VS:  BP 140/66 mmHg  Pulse 73  Ht 5\' 4"  (1.626 m)  Wt 148 lb (67.132 kg)  BMI 25.39 kg/m2 , BMI Body mass index is 25.39 kg/(m^2). GEN: Well nourished, well  developed, in no acute distress HEENT: normal Neck: no JVD, carotid bruits, or masses Cardiac: RRR; no murmurs, rubs, or gallops,no edema  Respiratory:  clear to auscultation bilaterally, normal work of breathing GI: soft, nontender, distended, + BS MS: no deformity or atrophy Skin: warm and dry, no rash Neuro:  Strength and sensation are intact Psych: euthymic mood, full affect,  Mild dementia.   Recent Labs: 06/09/2014: Pro B Natriuretic peptide (BNP) 30358.0* 06/10/2014: TSH 3.720 10/23/2014: Magnesium 2.0 12/23/2014: ALT 19  01/02/2015: B Natriuretic Peptide >4500.0* 01/09/2015: BUN 72*; Creatinine 3.63*; Potassium 4.1; Sodium 148* 03/05/2015: Hemoglobin 9.1*; Platelets 163    Lipid Panel    Component Value Date/Time   CHOL 164 04/20/2012 0618   TRIG 129 04/20/2012 0618   HDL 57 04/20/2012 0618   CHOLHDL 2.9 04/20/2012 0618   VLDL 26 04/20/2012 0618   LDLCALC 81 04/20/2012 0618      Wt Readings from Last 3 Encounters:  03/09/15 148 lb (67.132 kg)  02/23/15 150 lb (68.04 kg)  01/22/15 136 lb 6.4 oz (61.871 kg)      Other studies Reviewed: Additional studies/ records that were reviewed today include: CBC.  BMET. Review of the above records demonstrates: improvement of anemia   ASSESSMENT AND PLAN:  1. Acute on chronic mixed CHF: despite increased dose of Demedex.  He is only lost 2 pounds, continues, abdominal distention.  She had been on 60 mg in the past.  I will return to that maintenance dose.  She will continue to weigh daily.I will repeat a BMET  2. Anemia:  Improved on most recent labs. Will defer to PCP. Likely related to CKD.   Current medicines are reviewed at length with the patient today.    Labs/ tests ordered today include:   Orders Placed This Encounter  Procedures  . Basic Metabolic Panel (BMET)     Disposition:   FU with 1 month. Signed, Jory Sims, NP  03/09/2015 4:58 PM    [facility] [address],  [place], [place] [address] Phone: [phone]; Fax: [phone]

## 2015-03-15 ENCOUNTER — Ambulatory Visit: Payer: Medicare Other | Admitting: *Deleted

## 2015-03-15 ENCOUNTER — Encounter: Payer: Self-pay | Admitting: *Deleted

## 2015-03-16 ENCOUNTER — Encounter: Payer: Self-pay | Admitting: *Deleted

## 2015-03-19 ENCOUNTER — Encounter (HOSPITAL_COMMUNITY): Payer: Self-pay

## 2015-03-19 ENCOUNTER — Encounter (HOSPITAL_COMMUNITY): Payer: Medicare Other | Attending: Hematology & Oncology

## 2015-03-19 ENCOUNTER — Other Ambulatory Visit: Payer: Self-pay | Admitting: *Deleted

## 2015-03-19 VITALS — BP 138/54 | HR 68 | Temp 98.0°F | Resp 20

## 2015-03-19 DIAGNOSIS — D631 Anemia in chronic kidney disease: Secondary | ICD-10-CM | POA: Insufficient documentation

## 2015-03-19 DIAGNOSIS — D509 Iron deficiency anemia, unspecified: Secondary | ICD-10-CM

## 2015-03-19 DIAGNOSIS — N184 Chronic kidney disease, stage 4 (severe): Secondary | ICD-10-CM | POA: Diagnosis present

## 2015-03-19 DIAGNOSIS — D62 Acute posthemorrhagic anemia: Secondary | ICD-10-CM

## 2015-03-19 LAB — CBC WITH DIFFERENTIAL/PLATELET
BASOS ABS: 0 10*3/uL (ref 0.0–0.1)
BASOS PCT: 1 % (ref 0–1)
EOS ABS: 0 10*3/uL (ref 0.0–0.7)
EOS PCT: 1 % (ref 0–5)
HEMATOCRIT: 27.6 % — AB (ref 36.0–46.0)
Hemoglobin: 8.5 g/dL — ABNORMAL LOW (ref 12.0–15.0)
LYMPHS PCT: 31 % (ref 12–46)
Lymphs Abs: 1.2 10*3/uL (ref 0.7–4.0)
MCH: 27.7 pg (ref 26.0–34.0)
MCHC: 30.8 g/dL (ref 30.0–36.0)
MCV: 89.9 fL (ref 78.0–100.0)
MONO ABS: 0.5 10*3/uL (ref 0.1–1.0)
Monocytes Relative: 12 % (ref 3–12)
NEUTROS ABS: 2.2 10*3/uL (ref 1.7–7.7)
Neutrophils Relative %: 55 % (ref 43–77)
Platelets: 190 10*3/uL (ref 150–400)
RBC: 3.07 MIL/uL — ABNORMAL LOW (ref 3.87–5.11)
RDW: 20.2 % — ABNORMAL HIGH (ref 11.5–15.5)
WBC: 4 10*3/uL (ref 4.0–10.5)

## 2015-03-19 MED ORDER — DARBEPOETIN ALFA 60 MCG/0.3ML IJ SOSY
60.0000 ug | PREFILLED_SYRINGE | Freq: Once | INTRAMUSCULAR | Status: AC
  Administered 2015-03-19: 60 ug via SUBCUTANEOUS
  Filled 2015-03-19: qty 0.3

## 2015-03-19 NOTE — Progress Notes (Signed)
..  [name] presents today for injection per the provider's orders.  Aranesp administration without incident; see MAR for injection details.  Patient tolerated procedure well and without incident.  No questions or complaints noted at this time.

## 2015-03-19 NOTE — Patient Outreach (Signed)
Call received from patient daughter, [name]. Discussed home visit for [facility] community care management services. Appointment set up for April 28th Reaffirmed she had RNCM contact information for any questions or if she needs to change appointment.  Plan for Home visit March 22, 2015  [name]. [name], RN, BSN, [place] [phone]

## 2015-03-19 NOTE — Patient Outreach (Signed)
Call attempt to patient home to schedule home visit assessment. No answer, unable to leave voicemail as no box set up. Will attempt again later in week and try to schedule appointment [name]. [name], RN, BSN, [place] [phone]

## 2015-03-20 LAB — FERRITIN: Ferritin: 308 ng/mL — ABNORMAL HIGH (ref 10–291)

## 2015-03-22 ENCOUNTER — Other Ambulatory Visit: Payer: Self-pay | Admitting: *Deleted

## 2015-03-22 NOTE — Patient Outreach (Signed)
[facility]) Care Management   03/22/2015  Deyja Sochacki Ryant 1941/10/06 [id number]  Zanylah Hardie Therien is an 74 y.o. female  Subjective:  Patient denies any issues or concerns. Daughter voices that patient doing pretty well since her cardiac arrest a few weeks ago. Daughter does say the kidney Doctor is discussing dialysis with them, requesting information on dialysis. Daughter concerned about quality of life on dialysis, want to discuss further.  Patient not speaking to [facility] unless asked a direct question, daughter, [name] answering most of the assessment questions.  Daughter able to verbalize plan for HF exacerbation.  Patient continues to get [facility]    Objective:   BP 118/60 mmHg  Pulse 64  Resp 22  Ht 1.626 m (5\' 4" )  Wt 143 lb (64.864 kg)  BMI 24.53 kg/m2  SpO2 95%  Review of Systems  Constitutional: Negative.   HENT: Negative.   Eyes: Negative.   Respiratory: Positive for shortness of breath.        Shortness of breath with exertion, rests to relieve Wears oxygen 2lpm   Cardiovascular: Negative.   Musculoskeletal: Positive for falls.       Reports a fall 3 weeks ago, no injury States she stood up and sat down   Skin: Negative.     Physical Exam  Constitutional: She appears well-developed and well-nourished.  Cardiovascular: Normal rate.   Respiratory: Effort normal and breath sounds normal.  GI: Bowel sounds are normal. She exhibits distension.  Abdomen was distended but denies any issues and reports BM this am.  Musculoskeletal: Normal range of motion.  Generalized weakness, uses walker for ambulation  Neurological: She is alert.  Skin: Skin is warm and dry.  Psychiatric: She has a normal mood and affect. Her behavior is normal.    Current Medications:   Current Outpatient Prescriptions  Medication Sig Dispense Refill  . aspirin 81 MG tablet Take 81 mg by mouth daily.    . calcitRIOL (ROCALTROL) 0.25 MCG  capsule Take 1 capsule (0.25 mcg total) by mouth daily. 31 capsule 0  . donepezil (ARICEPT) 10 MG tablet Take 10 mg by mouth at bedtime.     . folic acid (FOLVITE) 1 MG tablet Take 1 mg by mouth every morning.     . hydrALAZINE (APRESOLINE) 25 MG tablet Take 25 mg by mouth.    . insulin detemir (LEVEMIR) 100 UNIT/ML injection Inject 0.08 mLs (8 Units total) into the skin at bedtime. 10 mL 0  . linagliptin (TRADJENTA) 5 MG TABS tablet Take 5 mg by mouth every morning.     . Maltodextrin-Xanthan Gum (RESOURCE THICKENUP CLEAR) POWD Take 120 g by mouth as needed.    . metoprolol succinate (TOPROL-XL) 100 MG 24 hr tablet Take 50 mg by mouth every morning. Take with or immediately following a meal.    . mupirocin ointment (BACTROBAN) 2 %     . pantoprazole (PROTONIX) 40 MG tablet Take 40 mg by mouth every morning.    . potassium chloride (K-DUR) 10 MEQ tablet Take 1 tablet (10 mEq total) by mouth daily. 90 tablet 3  . pravastatin (PRAVACHOL) 20 MG tablet Take 20 mg by mouth at bedtime.     . torsemide (DEMADEX) 20 MG tablet Take 3 tablets (60 mg total) by mouth daily. 90 tablet 11  . isosorbide mononitrate (IMDUR) 30 MG 24 hr tablet Take 0.5 tablets (15 mg total) by mouth every morning. (Patient not taking: Reported on 03/22/2015) 30  tablet 0  . levETIRAcetam (KEPPRA) 100 MG/ML solution Take 500 mg by mouth 2 (two) times daily.     . metolazone (ZAROXOLYN) 5 MG tablet      No current facility-administered medications for this visit.    Functional Status:   In your present state of health, do you have any difficulty performing the following activities: 03/22/2015 02/12/2015  Hearing? N -  Vision? N -  Difficulty concentrating or making decisions? Y -  Walking or climbing stairs? Y -  Dressing or bathing? Y -  Doing errands, shopping? Y -  Conservation officer, nature and eating ? Y -  Using the Toilet? Y -  In the past six months, have you accidently leaked urine? Y -  Do you have problems with loss of bowel  control? Y -  Managing your Medications? Y (No Data)  Managing your Finances? Y (No Data)  Housekeeping or managing your Housekeeping? 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 [name], RN, BSN, [place] [phone]

## 2015-04-02 ENCOUNTER — Encounter (HOSPITAL_COMMUNITY): Payer: Self-pay

## 2015-04-02 ENCOUNTER — Encounter (HOSPITAL_COMMUNITY): Payer: Medicare Other | Attending: Hematology and Oncology

## 2015-04-02 ENCOUNTER — Encounter (HOSPITAL_COMMUNITY): Payer: Medicare Other | Attending: Hematology & Oncology

## 2015-04-02 VITALS — BP 146/56 | HR 67 | Temp 97.5°F | Resp 18

## 2015-04-02 DIAGNOSIS — I2510 Atherosclerotic heart disease of native coronary artery without angina pectoris: Secondary | ICD-10-CM | POA: Diagnosis not present

## 2015-04-02 DIAGNOSIS — N184 Chronic kidney disease, stage 4 (severe): Secondary | ICD-10-CM | POA: Insufficient documentation

## 2015-04-02 DIAGNOSIS — Z951 Presence of aortocoronary bypass graft: Secondary | ICD-10-CM | POA: Diagnosis not present

## 2015-04-02 DIAGNOSIS — D62 Acute posthemorrhagic anemia: Secondary | ICD-10-CM

## 2015-04-02 DIAGNOSIS — E119 Type 2 diabetes mellitus without complications: Secondary | ICD-10-CM | POA: Diagnosis not present

## 2015-04-02 DIAGNOSIS — Z79899 Other long term (current) drug therapy: Secondary | ICD-10-CM | POA: Insufficient documentation

## 2015-04-02 DIAGNOSIS — F0390 Unspecified dementia without behavioral disturbance: Secondary | ICD-10-CM | POA: Insufficient documentation

## 2015-04-02 DIAGNOSIS — D649 Anemia, unspecified: Secondary | ICD-10-CM | POA: Insufficient documentation

## 2015-04-02 DIAGNOSIS — I252 Old myocardial infarction: Secondary | ICD-10-CM | POA: Insufficient documentation

## 2015-04-02 DIAGNOSIS — I129 Hypertensive chronic kidney disease with stage 1 through stage 4 chronic kidney disease, or unspecified chronic kidney disease: Secondary | ICD-10-CM | POA: Diagnosis not present

## 2015-04-02 DIAGNOSIS — D631 Anemia in chronic kidney disease: Secondary | ICD-10-CM

## 2015-04-02 DIAGNOSIS — D509 Iron deficiency anemia, unspecified: Secondary | ICD-10-CM

## 2015-04-02 DIAGNOSIS — Z794 Long term (current) use of insulin: Secondary | ICD-10-CM | POA: Diagnosis not present

## 2015-04-02 DIAGNOSIS — Z87891 Personal history of nicotine dependence: Secondary | ICD-10-CM | POA: Insufficient documentation

## 2015-04-02 DIAGNOSIS — Z87898 Personal history of other specified conditions: Secondary | ICD-10-CM | POA: Insufficient documentation

## 2015-04-02 DIAGNOSIS — Z8673 Personal history of transient ischemic attack (TIA), and cerebral infarction without residual deficits: Secondary | ICD-10-CM | POA: Diagnosis not present

## 2015-04-02 DIAGNOSIS — E780 Pure hypercholesterolemia: Secondary | ICD-10-CM | POA: Diagnosis not present

## 2015-04-02 DIAGNOSIS — R569 Unspecified convulsions: Secondary | ICD-10-CM | POA: Diagnosis not present

## 2015-04-02 DIAGNOSIS — I509 Heart failure, unspecified: Secondary | ICD-10-CM | POA: Insufficient documentation

## 2015-04-02 LAB — CBC WITH DIFFERENTIAL/PLATELET
BASOS PCT: 1 % (ref 0–1)
Basophils Absolute: 0 10*3/uL (ref 0.0–0.1)
Eosinophils Absolute: 0 10*3/uL (ref 0.0–0.7)
Eosinophils Relative: 1 % (ref 0–5)
HCT: 30.6 % — ABNORMAL LOW (ref 36.0–46.0)
Hemoglobin: 9.4 g/dL — ABNORMAL LOW (ref 12.0–15.0)
LYMPHS ABS: 1.1 10*3/uL (ref 0.7–4.0)
LYMPHS PCT: 25 % (ref 12–46)
MCH: 27.9 pg (ref 26.0–34.0)
MCHC: 30.7 g/dL (ref 30.0–36.0)
MCV: 90.8 fL (ref 78.0–100.0)
MONO ABS: 0.5 10*3/uL (ref 0.1–1.0)
Monocytes Relative: 11 % (ref 3–12)
NEUTROS ABS: 2.8 10*3/uL (ref 1.7–7.7)
Neutrophils Relative %: 62 % (ref 43–77)
PLATELETS: 197 10*3/uL (ref 150–400)
RBC: 3.37 MIL/uL — ABNORMAL LOW (ref 3.87–5.11)
RDW: 20.1 % — ABNORMAL HIGH (ref 11.5–15.5)
WBC: 4.4 10*3/uL (ref 4.0–10.5)

## 2015-04-02 LAB — FERRITIN: FERRITIN: 201 ng/mL (ref 11–307)

## 2015-04-02 MED ORDER — DARBEPOETIN ALFA 60 MCG/0.3ML IJ SOSY
PREFILLED_SYRINGE | INTRAMUSCULAR | Status: AC
  Filled 2015-04-02: qty 0.3

## 2015-04-02 MED ORDER — DARBEPOETIN ALFA 60 MCG/0.3ML IJ SOSY
60.0000 ug | PREFILLED_SYRINGE | Freq: Once | INTRAMUSCULAR | Status: AC
  Administered 2015-04-02: 60 ug via SUBCUTANEOUS

## 2015-04-02 NOTE — Progress Notes (Signed)
..  [name] presents today for injection per the provider's orders.  aranesp administration without incident; see MAR for injection details.  Patient tolerated procedure well and without incident.  No questions or complaints noted at this time.

## 2015-04-02 NOTE — Progress Notes (Signed)
LABS DRAWN

## 2015-04-12 ENCOUNTER — Ambulatory Visit: Payer: Medicare Other | Admitting: Adult Health

## 2015-04-13 ENCOUNTER — Ambulatory Visit (INDEPENDENT_AMBULATORY_CARE_PROVIDER_SITE_OTHER): Payer: Medicare Other | Admitting: Adult Health

## 2015-04-13 ENCOUNTER — Encounter: Payer: Self-pay | Admitting: Adult Health

## 2015-04-13 VITALS — BP 122/62 | HR 69

## 2015-04-13 DIAGNOSIS — I10 Essential (primary) hypertension: Secondary | ICD-10-CM

## 2015-04-13 NOTE — Progress Notes (Signed)
Cardiology Office Note   Date:  04/13/2015   ID:  [name], [name] August 04, 1941, MRN [id number]  PCP:  Kimberly Font, MD  Cardiologist:  Kimberly Spring, NP   Chief Complaint  Patient presents with  . Coronary Artery Disease  . Congestive Heart Failure      History of Present Illness: [name] is a 74 y.o. female who presents for ongoing assessment and management admixed CHF, CAD, whose last in the office on 03/09/2015.  The patient complained of abdominal distention and only losing 2 pounds with diuretic regimen.    Most recent echocardiogram in March of 2016 demonstrated ejection fraction of 35% to 45%.  On last office visit we increased her Demadex to 60 milligrams daily.  Repeat BMET.  She has lost an additional 7 lbs since being seen last. She is breathing better and is more ambulatory now that she is carrying less fluid. She denies dizziness. BMET has not been completed.      Past Medical History  Diagnosis Date  . Type 2 diabetes mellitus   . Essential hypertension, benign   . History of stroke     Previously on Coumadin  . MI, old     Reported 84  . Seizure disorder   . Coronary atherosclerosis of native coronary artery     Multivessel status post CABG 2002  . History of GI bleed     Erosive gastritis and duodenitis by EGD 2002  . Mixed hyperlipidemia   . Ischemic cardiomyopathy     LVEF 40-45% March 2013  . Dementia   . CKD (chronic kidney disease) stage 4, GFR 15-29 ml/min   . CHF (congestive heart failure)   . Chronic respiratory failure with hypoxia   . Stroke   . Seizures   . Anemia of chronic renal failure, stage 4 (severe) 01/02/2015    Past Surgical History  Procedure Laterality Date  . Coronary artery bypass graft  July 2002    LIMA to LAD, SVG to OM1, SVG to OM 2, SVG to RCA  . Tee without cardioversion  01/28/2012    Procedure: TRANSESOPHAGEAL ECHOCARDIOGRAM (TEE);  Surgeon: Kimberly Perla, MD;  Location[facility] ENDOSCOPY;  Service:  Cardiovascular;  Laterality: [name];  . Colonoscopy  2002  . Esophagogastroduodenoscopy  2002  . Esophagogastroduodenoscopy [name] 01/18/2014    PPI:RJJOA hiatal hernia. Abnormal gastric and duodenal bulbar mucosa of uncertain significance - status post gastric bx (chronic gastritis/H.pylori +  . Colonoscopy [name] 06/25/2014    Dr. Laural [name]: tubular adenoma, pandiverticulosis.   . Agile capsule [name] 07/06/2014    Procedure: AGILE CAPSULE;  Surgeon: Kimberly Binder, MD;  Location: [facility] ENDO SUITE;  Service: Endoscopy;  Laterality: [name];  730  . Esophagogastroduodenoscopy [name] 07/20/2014    Dr. Gala [name]: tiny gastric polyps not manipulated. minimal pigmentation of duodenal bulb likely not significant  . Givens capsule study [name] 07/20/2014    incomplete     Current Outpatient Prescriptions  Medication Sig Dispense Refill  . aspirin 81 MG tablet Take 81 mg by mouth daily.    . calcitRIOL (ROCALTROL) 0.25 MCG capsule Take 1 capsule (0.25 mcg total) by mouth daily. 31 capsule 0  . donepezil (ARICEPT) 10 MG tablet Take 10 mg by mouth at bedtime.     . folic acid (FOLVITE) 1 MG tablet Take 1 mg by mouth every morning.     . hydrALAZINE (APRESOLINE) 25 MG tablet Take 25 mg by mouth.    . insulin detemir (  LEVEMIR) 100 UNIT/ML injection Inject 0.08 mLs (8 Units total) into the skin at bedtime. 10 mL 0  . isosorbide mononitrate (IMDUR) 30 MG 24 hr tablet Take 0.5 tablets (15 mg total) by mouth every morning. 30 tablet 0  . levETIRAcetam (KEPPRA) 100 MG/ML solution Take 500 mg by mouth 2 (two) times daily.     Marland Kitchen linagliptin (TRADJENTA) 5 MG TABS tablet Take 5 mg by mouth every morning.     . Maltodextrin-Xanthan Gum (RESOURCE THICKENUP CLEAR) POWD Take 120 g by mouth as needed.    . metolazone (ZAROXOLYN) 5 MG tablet     . metoprolol succinate (TOPROL-XL) 100 MG 24 hr tablet Take 50 mg by mouth every morning. Take with or immediately following a meal.    . mupirocin ointment (BACTROBAN) 2 %     . pantoprazole (PROTONIX) 40  MG tablet Take 40 mg by mouth every morning.    . potassium chloride (K-DUR) 10 MEQ tablet Take 1 tablet (10 mEq total) by mouth daily. 90 tablet 3  . pravastatin (PRAVACHOL) 20 MG tablet Take 20 mg by mouth at bedtime.     . torsemide (DEMADEX) 20 MG tablet Take 3 tablets (60 mg total) by mouth daily. 90 tablet 11   No current facility-administered medications for this visit.    Allergies:   Review of patient's allergies indicates no known allergies.    Social History:  The patient  reports that she quit smoking about 3 years ago. Her smoking use included Cigarettes. She started smoking about 54 years ago. She has a 50 pack-year smoking history. She has never used smokeless tobacco. She reports that she does not drink alcohol or use illicit drugs.   Family History:  The patient's family history includes Diabetes type II in her mother; Stroke in her father. There is no history of Colon cancer.    ROS: .   All other systems are reviewed and negative.Unless otherwise mentioned in H&P above.   PHYSICAL EXAM: VS:  BP 122/62 mmHg  Pulse 69  SpO2 92% , BMI There is no weight on file to calculate BMI. GEN: Well nourished, well developed, in no acute distress HEENT: normal Neck: no JVD, carotid bruits, or masses Cardiac: RRR;distant heart sounds, no murmurs, rubs, or gallops,no edema  Respiratory:  clear to auscultation bilaterally, normal work of breathing.Wearing O2.,  GI: soft, nontender, nondistended, + BS MS: no deformity or atrophy Skin: warm and dry, no rash Neuro:  Strength and sensation are intact Psych: euthymic mood, full affect   Recent Labs: 06/09/2014: Pro B Natriuretic peptide (BNP) 30358.0* 06/10/2014: TSH 3.720 10/23/2014: Magnesium 2.0 12/23/2014: ALT 19 01/02/2015: B Natriuretic Peptide >4500.0* 01/09/2015: BUN 72*; Creatinine 3.63*; Potassium 4.1; Sodium 148* 04/02/2015: Hemoglobin 9.4*; Platelets 197    Lipid Panel    Component Value Date/Time   CHOL 164  04/20/2012 0618   TRIG 129 04/20/2012 0618   HDL 57 04/20/2012 0618   CHOLHDL 2.9 04/20/2012 0618   VLDL 26 04/20/2012 0618   LDLCALC 81 04/20/2012 0618      Wt Readings from Last 3 Encounters:  03/22/15 143 lb (64.864 kg)  03/09/15 148 lb (67.132 kg)  02/23/15 150 lb (68.04 kg)      Other studies Reviewed: Additional studies/ records that were reviewed today include: None Review of the above records demonstrates: Needs BMET   ASSESSMENT AND PLAN:  1.  Chronic Mixed CHF: She has lost 7 more pounds now that she is back on her normal  dose of Demedex. She is breathing better and is more ambulatory. She is to have BMET drawn per [facility]. Will see her in 3 months.   2. Anemia: She is being followed by her PCP for this. Labs are monitored.    Current medicines are reviewed at length with the patient today.    Labs/ tests ordered today include: BMET  Orders Placed This Encounter  Procedures  . Basic Metabolic Panel (BMET)     Disposition:   FU with 3 months.   Signed, Jory Sims, NP  04/13/2015 3:54 PM    [facility] [address] [place], [place] [address] Phone: [phone]; Fax: [phone]

## 2015-04-13 NOTE — Patient Instructions (Signed)
Your physician recommends that you schedule a follow-up appointment in: 3 months with Jory Sims, NP.   Your physician recommends that you continue on your current medications as directed. Please refer to the Current Medication list given to you today.] Your physician recommends that you return for lab work in: Today  Thank you for choosing [facility]!

## 2015-04-13 NOTE — Progress Notes (Deleted)
Name: [name]    DOB: 26-Jun-1941  Age: 74 y.o.  MR#: [id number]       PCP:  [name], MD      Insurance: Payor: MEDICARE / Plan: MEDICARE PART A AND B / Product Type: *No Product type* /   CC:    Chief Complaint  Patient presents with  . Coronary Artery Disease  . Congestive Heart Failure    VS Filed Vitals:   04/13/15 1521  BP: 122/62  Pulse: 69  SpO2: 92%    Weights Current Weight  03/22/15 143 lb (64.864 kg)  03/09/15 148 lb (67.132 kg)  02/23/15 150 lb (68.04 kg)    Blood Pressure  BP Readings from Last 3 Encounters:  04/13/15 122/62  04/02/15 146/56  03/22/15 118/60     Admit date:  (Not on file) Last encounter with RMR:  03/09/2015   Allergy Review of patient's allergies indicates no known allergies.  Current Outpatient Prescriptions  Medication Sig Dispense Refill  . aspirin 81 MG tablet Take 81 mg by mouth daily.    . calcitRIOL (ROCALTROL) 0.25 MCG capsule Take 1 capsule (0.25 mcg total) by mouth daily. 31 capsule 0  . donepezil (ARICEPT) 10 MG tablet Take 10 mg by mouth at bedtime.     . folic acid (FOLVITE) 1 MG tablet Take 1 mg by mouth every morning.     . hydrALAZINE (APRESOLINE) 25 MG tablet Take 25 mg by mouth.    . insulin detemir (LEVEMIR) 100 UNIT/ML injection Inject 0.08 mLs (8 Units total) into the skin at bedtime. 10 mL 0  . isosorbide mononitrate (IMDUR) 30 MG 24 hr tablet Take 0.5 tablets (15 mg total) by mouth every morning. 30 tablet 0  . levETIRAcetam (KEPPRA) 100 MG/ML solution Take 500 mg by mouth 2 (two) times daily.     Marland Kitchen linagliptin (TRADJENTA) 5 MG TABS tablet Take 5 mg by mouth every morning.     . Maltodextrin-Xanthan Gum (RESOURCE THICKENUP CLEAR) POWD Take 120 g by mouth as needed.    . metolazone (ZAROXOLYN) 5 MG tablet     . metoprolol succinate (TOPROL-XL) 100 MG 24 hr tablet Take 50 mg by mouth every morning. Take with or immediately following a meal.    . mupirocin ointment (BACTROBAN) 2 %     . pantoprazole  (PROTONIX) 40 MG tablet Take 40 mg by mouth every morning.    . potassium chloride (K-DUR) 10 MEQ tablet Take 1 tablet (10 mEq total) by mouth daily. 90 tablet 3  . pravastatin (PRAVACHOL) 20 MG tablet Take 20 mg by mouth at bedtime.     . torsemide (DEMADEX) 20 MG tablet Take 3 tablets (60 mg total) by mouth daily. 90 tablet 11   No current facility-administered medications for this visit.    Discontinued Meds:   There are no discontinued medications.  Patient Active Problem List   Diagnosis Date Noted  . UTI (urinary tract infection) 01/04/2015  . Anemia of chronic renal failure, stage 4 (severe) 01/02/2015  . Acute on chronic respiratory failure with hypoxia 01/02/2015  . Elevated troponin 01/02/2015  . AKI (acute kidney injury) 12/31/2014  . Dysphagia 12/24/2014  . Seizure 12/23/2014  . Acute renal failure 12/23/2014  . Anemia 12/23/2014  . Hypernatremia   . CKD (chronic kidney disease)   . Swelling of both ankles   . Lower GI bleeding 10/22/2014  . Acute renal failure superimposed on stage 4 chronic kidney disease 07/22/2014  . Cardiomyopathy,  ischemic 07/20/2014  . Severe anemia 07/18/2014  . Choking episode 04/28/2014  . Hypoglycemia 03/16/2014  . CAP (community acquired pneumonia) 03/15/2014  . Severe mitral regurgitation 02/22/2014  . Acute on chronic combined systolic and diastolic congestive heart failure 02/22/2014  . CHF, acute on chronic 02/21/2014  . Dementia 02/21/2014  . CHF (congestive heart failure) 02/21/2014  . Diarrhea 02/20/2014  . Edema of right lower extremity 02/20/2014  . GI bleeding 01/16/2014  . Weakness generalized 01/16/2014  . Anemia associated with acute blood loss 01/16/2014  . NSTEMI (non-ST elevated myocardial infarction) 01/16/2014  . Systolic CHF, chronic last EF 40% 3/13 01/16/2014  . Iron deficiency anemia 01/16/2014  . Bilateral lower extremity edema 07/22/2013  . Hyperglycemia 12/21/2012  . CKD (chronic kidney disease) stage 4,  GFR 15-29 ml/min 12/21/2012  . Diabetes 12/21/2012  . Dehydration 12/21/2012  . CVA (cerebral infarction) 04/19/2012  . Neutropenia 01/30/2012  . Seizure disorder 01/25/2012  . ARF (acute renal failure) 01/25/2012  . Acute CVA Left frontal lobe,   . Hypertension   . Diabetes mellitus     LABS    Component Value Date/Time   NA 148* 01/09/2015 0543   NA 149* 01/08/2015 0555   NA 145 01/07/2015 0609   K 4.1 01/09/2015 0543   K 4.1 01/08/2015 0555   K 4.0 01/07/2015 0609   CL 107 01/09/2015 0543   CL 109 01/08/2015 0555   CL 110 01/07/2015 0609   CO2 32 01/09/2015 0543   CO2 33* 01/08/2015 0555   CO2 31 01/07/2015 0609   GLUCOSE 142* 01/09/2015 0543   GLUCOSE 138* 01/08/2015 0555   GLUCOSE 120* 01/07/2015 0609   BUN 72* 01/09/2015 0543   BUN 73* 01/08/2015 0555   BUN 70* 01/07/2015 0609   CREATININE 3.63* 01/09/2015 0543   CREATININE 3.74* 01/08/2015 0555   CREATININE 3.71* 01/07/2015 0609   CALCIUM 8.6 01/09/2015 0543   CALCIUM 8.3* 01/08/2015 0555   CALCIUM 7.9* 01/07/2015 0609   CALCIUM 8.3* 04/28/2014 0815   GFRNONAA 11* 01/09/2015 0543   GFRNONAA 11* 01/08/2015 0555   GFRNONAA 11* 01/07/2015 0609   GFRAA 13* 01/09/2015 0543   GFRAA 13* 01/08/2015 0555   GFRAA 13* 01/07/2015 0609   CMP     Component Value Date/Time   NA 148* 01/09/2015 0543   K 4.1 01/09/2015 0543   CL 107 01/09/2015 0543   CO2 32 01/09/2015 0543   GLUCOSE 142* 01/09/2015 0543   BUN 72* 01/09/2015 0543   CREATININE 3.63* 01/09/2015 0543   CALCIUM 8.6 01/09/2015 0543   CALCIUM 8.3* 04/28/2014 0815   PROT 8.1 12/23/2014 0639   ALBUMIN 2.7* 12/23/2014 0639   AST 35 12/23/2014 0639   ALT 19 12/23/2014 0639   ALKPHOS 119* 12/23/2014 0639   BILITOT 1.1 12/23/2014 0639   GFRNONAA 11* 01/09/2015 0543   GFRAA 13* 01/09/2015 0543       Component Value Date/Time   WBC 4.4 04/02/2015 1310   WBC 4.0 03/19/2015 1215   WBC 4.9 03/05/2015 1520   HGB 9.4* 04/02/2015 1310   HGB 8.5* 03/19/2015  1215   HGB 9.1* 03/05/2015 1520   HCT 30.6* 04/02/2015 1310   HCT 27.6* 03/19/2015 1215   HCT 29.6* 03/05/2015 1520   MCV 90.8 04/02/2015 1310   MCV 89.9 03/19/2015 1215   MCV 90.5 03/05/2015 1520    Lipid Panel     Component Value Date/Time   CHOL 164 04/20/2012 0618   TRIG 129 04/20/2012 0618  HDL 57 04/20/2012 0618   CHOLHDL 2.9 04/20/2012 0618   VLDL 26 04/20/2012 0618   LDLCALC 81 04/20/2012 0618    ABG    Component Value Date/Time   PHART 7.428 01/03/2015 1500   PCO2ART 45.4* 01/03/2015 1500   PO2ART 85.1 01/03/2015 1500   HCO3 29.5* 01/03/2015 1500   TCO2 27.8 01/03/2015 1500   ACIDBASEDEF 5.3* 03/15/2014 1817   O2SAT 96.2 01/03/2015 1500     Lab Results  Component Value Date   TSH 3.720 06/10/2014   BNP (last 3 results)  Recent Labs  01/02/15 1125  BNP >4500.0*    ProBNP (last 3 results)  Recent Labs  04/27/14 1536 06/09/14 1021  PROBNP 51631.0* 30358.0*    Cardiac Panel (last 3 results) No results for input(s): CKTOTAL, CKMB, TROPONINI, RELINDX in the last 72 hours.  Iron/TIBC/Ferritin/ %Sat    Component Value Date/Time   IRON 27* 01/05/2015 0549   TIBC 236* 01/05/2015 0549   FERRITIN 201 04/02/2015 1314   IRONPCTSAT 11* 01/05/2015 0549     EKG Orders placed or performed during the hospital encounter of 01/02/15  . ED EKG  . ED EKG  . ED EKG  . ED EKG  . EKG 12-Lead  . EKG 12-Lead  . EKG 12-Lead  . EKG 12-Lead  . EKG     Prior Assessment and Plan Problem List as of 04/13/2015      Cardiovascular and Mediastinum   Cardiomyopathy, ischemic   Acute CVA Left frontal lobe,   Hypertension   Last Assessment & Plan 03/08/2014 Office Visit Written 03/08/2014  3:32 PM by Lendon Colonel, NP    Pressure is well-controlled currently. Will not make any changes on her medication regimen at this time. She will see [place] again in 3 months unless symptomatic.      NSTEMI (non-ST elevated myocardial infarction)   Systolic CHF, chronic last  EF 40% 3/13   CHF, acute on chronic   CHF (congestive heart failure)   Last Assessment & Plan 03/08/2014 Office Visit Written 03/08/2014  3:31 PM by Lendon Colonel, NP    She does not appear to have any evidence of fluid overload, lungs are clear. Demadex dose apparently is working well for her at 20 mg daily. I seeing unilateral lower extremity edema on the right. She has had a recent Doppler ultrasound during hospitalization that was negative for DVT. I have advised TED hose and a prescription is been provided for her. She will be seen again in 3 months for ongoing assessment and management.      Severe mitral regurgitation   Acute on chronic combined systolic and diastolic congestive heart failure     Respiratory   CAP (community acquired pneumonia)   Acute on chronic respiratory failure with hypoxia     Digestive   GI bleeding   Lower GI bleeding   Dysphagia     Endocrine   Diabetes mellitus   Diabetes   Hypoglycemia     Nervous and Auditory   Dementia   Seizure disorder   CVA (cerebral infarction)     Genitourinary   ARF (acute renal failure)   CKD (chronic kidney disease) stage 4, GFR 15-29 ml/min   Last Assessment & Plan 01/22/2015 Office Visit Written 01/21/2015  8:50 PM by Baird Cancer, PA-C    Resulting in anemia secondary to renal disease.      Acute renal failure superimposed on stage 4 chronic kidney disease   CKD (chronic  kidney disease)   Acute renal failure   AKI (acute kidney injury)   Anemia of chronic renal failure, stage 4 (severe)   Last Assessment & Plan 01/22/2015 Office Visit Written 01/21/2015  8:48 PM by Baird Cancer, PA-C    On aranesp 60 mcg every 2 weeks with response to therapy.  Labs every 2 weeks: CBC diff.  Continue with Aranesp 60 mcg every 2 weeks.  Return in 12 weeks for follow-up.      UTI (urinary tract infection)     Other   Neutropenia   Hyperglycemia   Dehydration   Bilateral lower extremity edema   Weakness  generalized   Anemia associated with acute blood loss   Last Assessment & Plan 01/22/2015 Office Visit Written 01/21/2015  8:49 PM by Baird Cancer, PA-C    Requiring IV iron periodically.      Iron deficiency anemia   Last Assessment & Plan 01/22/2015 Office Visit Written 01/21/2015  8:49 PM by Baird Cancer, PA-C    Requiring IV iron periodically.  Labs every 4 weeks: iron/TIBC, ferritin.        Diarrhea   Last Assessment & Plan 02/20/2014 Office Visit Edited 02/20/2014 10:37 AM by Mahala Menghini, PA-C    Diarrhea noted since antibiotic therapy for H. pylori. 2-3 loose to soft stools daily. Suspect antibiotic associated but she is at increased risk of C. difficile. Check C. difficile PCR. Start restora one daily for 30 days.      Edema of right lower extremity   Last Assessment & Plan 02/20/2014 Office Visit Written 02/20/2014 10:27 AM by Mahala Menghini, PA-C    Acute onset right lower lower extremity edema. Noted in the leg that she has had vein grafting for coronary artery bypass surgery. Need to rule out DVT. Plan for Doppler study today.      Choking episode   Severe anemia   Last Assessment & Plan 07/27/2014 Office Visit Edited 07/27/2014 12:05 PM by Danie Binder, MD    REQUIRING pRBCs Q2-3 WEEKS DUE TO Hb DROPPING TO 5-6. ENDOSCOPIC EVALUATION HAS NOT REVEALED THE SOURCE OF BLEEDING. MAY BE DUE TO SML VOLUME OCCULT GI BLOOD LOSS IN SETTING OF RENAL INSUFFICIENCY.  DISCUSSED WITH DR. [place]. WILL SEE PT FOR IVFE AND PROCRIT. REFER TO [facility] FOR OBSCURE GI BLEED/TRANSFUSION DEPENDENT ANEMIA. FAMILY WILL PICK UP [facility] [place] AND ENDO REPORTS TO TAKE TO VISIT. CLOSE MONITORING OF CBC. TRNASFUSE PRN FOR Hb < 8.0  I PERSONALLY REVIEWED ABD FILMS WITH DR BOLES AND NO CAPSULE IN BOWEL AND POSSIBLE LEFT KIDNEY STONE. FOLLOW UP IN 3 MOS.        Swelling of both ankles   Seizure   Anemia   Hypernatremia   Elevated troponin       Imaging: No results found.

## 2015-04-16 ENCOUNTER — Ambulatory Visit (HOSPITAL_COMMUNITY): Payer: Medicare Other | Admitting: Hematology & Oncology

## 2015-04-16 ENCOUNTER — Encounter (HOSPITAL_COMMUNITY): Payer: Medicare Other | Attending: Hematology & Oncology

## 2015-04-16 ENCOUNTER — Encounter (HOSPITAL_BASED_OUTPATIENT_CLINIC_OR_DEPARTMENT_OTHER): Payer: Medicare Other | Admitting: Hematology & Oncology

## 2015-04-16 ENCOUNTER — Encounter (HOSPITAL_COMMUNITY): Payer: Self-pay | Admitting: Hematology & Oncology

## 2015-04-16 ENCOUNTER — Telehealth: Payer: Self-pay | Admitting: Cardiovascular Disease

## 2015-04-16 VITALS — BP 141/53 | HR 68 | Temp 97.8°F | Resp 16 | Wt 116.8 lb

## 2015-04-16 DIAGNOSIS — N184 Chronic kidney disease, stage 4 (severe): Secondary | ICD-10-CM | POA: Diagnosis present

## 2015-04-16 DIAGNOSIS — D631 Anemia in chronic kidney disease: Secondary | ICD-10-CM | POA: Diagnosis not present

## 2015-04-16 DIAGNOSIS — D509 Iron deficiency anemia, unspecified: Secondary | ICD-10-CM | POA: Diagnosis not present

## 2015-04-16 DIAGNOSIS — D62 Acute posthemorrhagic anemia: Secondary | ICD-10-CM | POA: Diagnosis not present

## 2015-04-16 LAB — CBC WITH DIFFERENTIAL/PLATELET
BASOS PCT: 1 % (ref 0–1)
Basophils Absolute: 0 10*3/uL (ref 0.0–0.1)
Eosinophils Absolute: 0 10*3/uL (ref 0.0–0.7)
Eosinophils Relative: 1 % (ref 0–5)
HEMATOCRIT: 26.1 % — AB (ref 36.0–46.0)
Hemoglobin: 8 g/dL — ABNORMAL LOW (ref 12.0–15.0)
LYMPHS ABS: 0.9 10*3/uL (ref 0.7–4.0)
Lymphocytes Relative: 21 % (ref 12–46)
MCH: 27.7 pg (ref 26.0–34.0)
MCHC: 30.7 g/dL (ref 30.0–36.0)
MCV: 90.3 fL (ref 78.0–100.0)
MONOS PCT: 12 % (ref 3–12)
Monocytes Absolute: 0.5 10*3/uL (ref 0.1–1.0)
Neutro Abs: 3 10*3/uL (ref 1.7–7.7)
Neutrophils Relative %: 65 % (ref 43–77)
Platelets: 188 10*3/uL (ref 150–400)
RBC: 2.89 MIL/uL — ABNORMAL LOW (ref 3.87–5.11)
RDW: 18 % — ABNORMAL HIGH (ref 11.5–15.5)
WBC: 4.5 10*3/uL (ref 4.0–10.5)

## 2015-04-16 LAB — FERRITIN: FERRITIN: 183 ng/mL (ref 11–307)

## 2015-04-16 MED ORDER — DARBEPOETIN ALFA 100 MCG/0.5ML IJ SOSY
80.0000 ug | PREFILLED_SYRINGE | Freq: Once | INTRAMUSCULAR | Status: AC
  Administered 2015-04-16: 80 ug via SUBCUTANEOUS
  Filled 2015-04-16: qty 0.5

## 2015-04-16 NOTE — Telephone Encounter (Signed)
BUN - 114 and Creatnine 3.61 / please call with any instructions /tg

## 2015-04-16 NOTE — Telephone Encounter (Signed)
Will forward to Kathryn Lawrence, N.P.  

## 2015-04-16 NOTE — Progress Notes (Signed)
Please see doctors encounter for more information 

## 2015-04-16 NOTE — Telephone Encounter (Signed)
Spoke with HHN,forward labs to pcp

## 2015-04-16 NOTE — Patient Instructions (Signed)
..  [place] at [facility] Discharge Instructions  RECOMMENDATIONS MADE BY THE CONSULTANT AND ANY TEST RESULTS WILL BE SENT TO YOUR REFERRING PHYSICIAN.  We will increase your aranesp dose today  Return in 3 months   Thank you for choosing [place] at [facility] to provide your oncology and hematology care.  To afford each patient quality time with our provider, please arrive at least 15 minutes before your scheduled appointment time.    You need to re-schedule your appointment should you arrive 10 or more minutes late.  We strive to give you quality time with our providers, and arriving late affects you and other patients whose appointments are after yours.  Also, if you no show three or more times for appointments you may be dismissed from the clinic at the providers discretion.     Again, thank you for choosing [facility].  Our hope is that these requests will decrease the amount of time that you wait before being seen by our physicians.       _____________________________________________________________  Should you have questions after your visit to [facility], please contact our office at [phone] between the hours of 8:30 a.m. and 4:30 p.m.  Voicemails left after 4:30 p.m. will not be returned until the following business day.  For prescription refill requests, have your pharmacy contact our office.

## 2015-04-16 NOTE — Telephone Encounter (Signed)
No changes. Please forward to her PCP

## 2015-04-16 NOTE — Progress Notes (Signed)
..  [name] presents today for injection per the provider's orders.  Aranesp administration without incident; see MAR for injection details.  Patient tolerated procedure well and without incident.  No questions or complaints noted at this time.

## 2015-04-16 NOTE — Progress Notes (Signed)
Kimberly Font, MD [address] [place] [address]   DIAGNOSIS: Anemia of chronic renal disease, Stage IV  CURRENT THERAPY: Aranesp 80 mcg every 2 weeks.  Last Ferric Gluconate infusion was on 12/27/2014 at 250 mg.   INTERVAL HISTORY: [name] 74 y.o. female returns for followup of anemia of chronic renal disease, Stage IV, requiring ESA support with Aranesp.  Additionally, she has an element of iron deficiency anemia, requiring IV iron occasionally.    Chart reviewed.  Hospitalization noted from 12/22/2014 - 12/31/2014 for acute renal injury superimposed on chronic renal disease, stage IV, compounded by hypernatremia, possible seizure, and suspected aspiration while in the hospital.  Additional hospitalization noted from 01/02/2015- 01/09/2015 for acute on chronic respiratory failure with hypoxia.  She has recently been started on torosemide. She has lost 20 pounds which her daughter thinks was "fluid."  (per our records she weighted 143 lbs on 4/28 and 116.8 today) She has been eating and sleeping well. She says she feels better and has no complaints today.  Past Medical History  Diagnosis Date  . Type 2 diabetes mellitus   . Essential hypertension, benign   . History of stroke     Previously on Coumadin  . MI, old     Reported 44  . Seizure disorder   . Coronary atherosclerosis of native coronary artery     Multivessel status post CABG 2002  . History of GI bleed     Erosive gastritis and duodenitis by EGD 2002  . Mixed hyperlipidemia   . Ischemic cardiomyopathy     LVEF 40-45% March 2013  . Dementia   . CKD (chronic kidney disease) stage 4, GFR 15-29 ml/min   . CHF (congestive heart failure)   . Chronic respiratory failure with hypoxia   . Stroke   . Seizures   . Anemia of chronic renal failure, stage 4 (severe) 01/02/2015    has Acute CVA Left frontal lobe,; Hypertension; Diabetes mellitus; Seizure disorder; ARF (acute renal failure); Neutropenia;  CVA (cerebral infarction); Hyperglycemia; CKD (chronic kidney disease) stage 4, GFR 15-29 ml/min; Diabetes; Dehydration; Bilateral lower extremity edema; GI bleeding; Weakness generalized; Anemia associated with acute blood loss; NSTEMI (non-ST elevated myocardial infarction); Systolic CHF, chronic last EF 40% 3/13; Iron deficiency anemia; Diarrhea; Edema of right lower extremity; CHF, acute on chronic; Dementia; CHF (congestive heart failure); Severe mitral regurgitation; Acute on chronic combined systolic and diastolic congestive heart failure; CAP (community acquired pneumonia); Hypoglycemia; Choking episode; Severe anemia; Cardiomyopathy, ischemic; Acute renal failure superimposed on stage 4 chronic kidney disease; Lower GI bleeding; Swelling of both ankles; CKD (chronic kidney disease); Seizure; Acute renal failure; Anemia; Hypernatremia; Dysphagia; AKI (acute kidney injury); Anemia of chronic renal failure, stage 4 (severe); Acute on chronic respiratory failure with hypoxia; Elevated troponin; and UTI (urinary tract infection) on her problem list.     has No Known Allergies.  Ms. Aguinaldo does not currently have medications on file.  Past Surgical History  Procedure Laterality Date  . Coronary artery bypass graft  July 2002    LIMA to LAD, SVG to OM1, SVG to OM 2, SVG to RCA  . Tee without cardioversion  01/28/2012    Procedure: TRANSESOPHAGEAL ECHOCARDIOGRAM (TEE);  Surgeon: Lelon Perla, MD;  Location: [facility] ENDOSCOPY;  Service: Cardiovascular;  Laterality: [name];  . Colonoscopy  2002  . Esophagogastroduodenoscopy  2002  . Esophagogastroduodenoscopy [name] 01/18/2014    LGX:QJJHE hiatal hernia. Abnormal gastric  and duodenal bulbar mucosa of uncertain significance - status post gastric bx (chronic gastritis/H.pylori +  . Colonoscopy [name] 06/25/2014    Dr. Laural Golden: tubular adenoma, pandiverticulosis.   . Agile capsule [name] 07/06/2014    Procedure: AGILE CAPSULE;  Surgeon: Danie Binder, MD;  Location: [facility]  ENDO SUITE;  Service: Endoscopy;  Laterality: [name];  730  . Esophagogastroduodenoscopy [name] 07/20/2014    Dr. Gala Romney: tiny gastric polyps not manipulated. minimal pigmentation of duodenal bulb likely not significant  . Givens capsule study [name] 07/20/2014    incomplete    Denies any headaches, dizziness, double vision, fevers, chills, night sweats, nausea, vomiting, diarrhea, constipation, chest pain, heart palpitations, shortness of breath, blood in stool, black tarry stool, urinary pain, urinary burning, urinary frequency, hematuria. Patient is a poor historian but denies any major problems.    PHYSICAL EXAMINATION  ECOG PERFORMANCE STATUS: 2 - Symptomatic, <50% confined to bed  Filed Vitals:   04/16/15 1322  BP: 141/53  Pulse: 68  Temp: 97.8 F (36.6 C)  Resp: 16    GENERAL:alert, no distress, well nourished, well developed, comfortable, cooperative, smiling and mentally handicapped Wearing O2 SKIN: skin color, texture, turgor are normal, no rashes or significant lesions HEAD: Normocephalic, No masses, lesions, tenderness or abnormalities EYES: normal, PERRLA, EOMI, Conjunctiva are pink and non-injected EARS: External ears normal OROPHARYNX:lips, buccal mucosa, and tongue normal and mucous membranes are moist  Poor dentition NECK: supple, no stridor, non-tender LYMPH:  no palpable lymphadenopathy, not examined BREAST:not examined LUNGS: clear to auscultation  HEART: regular rate & rhythm, no murmurs and no gallops ABDOMEN:abdomen soft and normal bowel sounds BACK: Back symmetric, no curvature., No CVA tenderness EXTREMITIES:less then 2 second capillary refill, no joint deformities, effusion, or inflammation, no skin discoloration, no cyanosis  NEURO: unsteady gait but ambulates to the exam table with assistance. right sided weakness noted   LABORATORY DATA: CBC    Component Value Date/Time   WBC 4.5 04/16/2015 1315   RBC 2.89* 04/16/2015 1315   RBC 1.74* 01/16/2014 1918    HGB 8.0* 04/16/2015 1315   HCT 26.1* 04/16/2015 1315   PLT 188 04/16/2015 1315   MCV 90.3 04/16/2015 1315   MCH 27.7 04/16/2015 1315   MCHC 30.7 04/16/2015 1315   RDW 18.0* 04/16/2015 1315   LYMPHSABS 0.9 04/16/2015 1315   MONOABS 0.5 04/16/2015 1315   EOSABS 0.0 04/16/2015 1315   BASOSABS 0.0 04/16/2015 1315      Chemistry      Component Value Date/Time   NA 148* 01/09/2015 0543   K 4.1 01/09/2015 0543   CL 107 01/09/2015 0543   CO2 32 01/09/2015 0543   BUN 72* 01/09/2015 0543   CREATININE 3.63* 01/09/2015 0543      Component Value Date/Time   CALCIUM 8.6 01/09/2015 0543   CALCIUM 8.3* 04/28/2014 0815   ALKPHOS 119* 12/23/2014 0639   AST 35 12/23/2014 0639   ALT 19 12/23/2014 0639   BILITOT 1.1 12/23/2014 0639       ASSESSMENT AND PLAN:  Anemia of chronic renal disease, Stage IV History of hemeoccult positive stool History of iron deficiency, felt to be secondary to occult GI bleeding  Will increase her aranesp to 80 mcg. Will continue to monitor H/H. She is currently asymptomatic but should her hgb drop further would recommend transfusion of PRBC. She has significant CKD and is followed by nephrology.   Her ferritin is pending today.  Should her ferritin be less than 100 we will arrange  for IV iron replacement as indicated for patients with CKD on ESA therapy.  Will follow-up in 3 months with labs. Continue current management.  Orders Placed This Encounter  Procedures  . ARANESP TREATMENT CONDITION    Hold Aranesp:  Renal hold for Hemoglobin greater than 11  . SCHEDULING COMMUNICATION INJECTION    Schedule injection appointment 15 min  . [place] COMMUNICATION LAB    Lab appointment 10 min.   All questions were answered. The patient knows to call the clinic with any problems, questions or concerns. We can certainly see the patient much sooner if necessary.  This document serves as a record of services personally performed by Ancil Linsey, MD. It was  created on her behalf by Arlyce Harman, a trained medical scribe. The creation of this record is based on the scribe's personal observations and the provider's statements to them. This document has been checked and approved by the attending provider.  I have reviewed the above documentation for accuracy and completeness, and I agree with the above.  Molli Hazard MD 04/16/2015

## 2015-04-17 NOTE — Progress Notes (Unsigned)
LABS DRAWN

## 2015-04-19 ENCOUNTER — Other Ambulatory Visit: Payer: Self-pay | Admitting: *Deleted

## 2015-04-19 NOTE — Patient Outreach (Signed)
[facility]) Care Management   04/19/2015  [name] [birth year] [id number]  [name] is an 74 y.o. female  Subjective:  Patient doing well, weight is down to 120 and breathing is much better. Daughter has questions about HCPOA, will get information to patient and daughter Reporting they have had lots of appointments this past month, saw Primary care, Cardiologist and also Cancer center.  Patient doing good per cardiologist, who is happy with weight loss, stating fluid weight, wt 120. Patient HGB down to some and they increased the dose of injection at cancer center. Daughter and patient both are concerned about possible dialysis, asking questions around dialysis.      Objective:    BP 122/58 mmHg  Pulse 67  Resp 20  Wt 120 lb (54.432 kg)  SpO2 93%  CBG 91   Review of Systems  Constitutional: Negative.   HENT: Negative.   Respiratory: Negative.   Cardiovascular: Negative.   Musculoskeletal: Negative.   Skin: Negative.   Neurological: Negative.     Physical Exam  Constitutional: She appears well-nourished.  Neck: Normal range of motion.  Cardiovascular: Normal rate and regular rhythm.   Respiratory: Effort normal.  GI: Soft.  Neurological: She is alert.  Skin: Skin is warm and dry.  Psychiatric: She has a normal mood and affect.    Current Medications:   Current Outpatient Prescriptions  Medication Sig Dispense Refill  . aspirin 81 MG tablet Take 81 mg by mouth daily.    . calcitRIOL (ROCALTROL) 0.25 MCG capsule Take 1 capsule (0.25 mcg total) by mouth daily. 31 capsule 0  . donepezil (ARICEPT) 10 MG tablet Take 10 mg by mouth at bedtime.     . folic acid (FOLVITE) 1 MG tablet Take 1 mg by mouth every morning.     . hydrALAZINE (APRESOLINE) 25 MG tablet Take 25 mg by mouth.    . insulin detemir (LEVEMIR) 100 UNIT/ML injection Inject 0.08 mLs (8 Units total) into the skin at bedtime. 10 mL 0  . isosorbide mononitrate (IMDUR) 30 MG  24 hr tablet Take 0.5 tablets (15 mg total) by mouth every morning. 30 tablet 0  . levETIRAcetam (KEPPRA) 100 MG/ML solution Take 500 mg by mouth 2 (two) times daily.     Marland Kitchen linagliptin (TRADJENTA) 5 MG TABS tablet Take 5 mg by mouth every morning.     . Maltodextrin-Xanthan Gum (RESOURCE THICKENUP CLEAR) POWD Take 120 g by mouth as needed.    . metoprolol succinate (TOPROL-XL) 100 MG 24 hr tablet Take 50 mg by mouth every morning. Take with or immediately following a meal.    . mupirocin ointment (BACTROBAN) 2 %     . pantoprazole (PROTONIX) 40 MG tablet Take 40 mg by mouth every morning.    . potassium chloride (K-DUR) 10 MEQ tablet Take 1 tablet (10 mEq total) by mouth daily. 90 tablet 3  . pravastatin (PRAVACHOL) 20 MG tablet Take 20 mg by mouth at bedtime.     . torsemide (DEMADEX) 20 MG tablet Take 3 tablets (60 mg total) by mouth daily. 90 tablet 11  . metolazone (ZAROXOLYN) 5 MG tablet      No current facility-administered medications for this visit.    Functional Status:   In your present state of health, do you have any difficulty performing the following activities: 03/22/2015 02/12/2015  Hearing? N -  Vision? N -  Difficulty concentrating or making decisions? Y -  Walking or climbing stairs? Y -  Dressing or bathing? Y -  Doing errands, shopping? Y -  Conservation officer, nature and eating ? Y -  Using the Toilet? Y -  In the past six months, have you accidently leaked urine? Y -  Do you have problems with loss of bowel control? Y -  Managing your Medications? Y (No Data)  Managing your Finances? Y (No Data)  Housekeeping or managing your Housekeeping? - (No Data)    Fall/Depression Screening:    PHQ 2/9 Scores 03/22/2015 10/16/2014 09/26/2014  PHQ - 2 Score 0 0 0    Assessment:   Reviewed weights Reviewed CBG's Reviewed medications and appointments with doctors. Verified they are keeping appointments. Discussed upcoming appointment with kidney MD, reviewed AV fistula, answered  questions around dialysis.  Reivewed HCPOA  Plan:   Stop by [facility] paperwork, ESRD educational materials. Plan outreach visit for June.  [name]. 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   Care Plan for Problem One  Active   [facility] Long Term Goal (31-90 days)  no hospitalizations for COPD in the next 90 days   [facility] Long Term Goal Start Date  03/22/15   Interventions for Problem One Long Term Goal  using teachback method, instructed on using oxygen 24/7    [facility] CM Care Plan Problem Two        Patient Outreach from 04/19/2015 in [place] Problem Two  Knowledge deficit related to new diagnosis of end stage renal disease and possible need for dialysis   Care Plan for Problem Two  Active   [facility] CM Short Term Goal #1 (0-30 days)  Patient and daughter will have formulated questions about dialyis before next appointment with kidney doctor   [name] CM Short Term Goal #2 (0-30 days)  Patient and family will have knowledge of impact of dialysis to make informed decision   [facility] CM Short Term Goal #2 Start Date  04/19/15   Interventions for Short Term Goal #2  using teachback method reviewed dialysis, AV fistula     [facility] CM Care Plan Problem Three        Patient Outreach from 04/19/2015 in [place] Problem Three  no established plans of care for new onset or worsening signs and symptoms of chronic disease   Care Plan for Problem Three  Active   [facility] CM Short Term Goal #1 (0-30 days)  patient and caregiver can verbalize action plan for chronic disease management within the next 31 days   [facility] CM Short Term Goal #1 Start Date  03/22/15   [facility] CM Short Term Goal #1 Met Date  04/19/15   Interventions for Short Term Goal #1  using teacback method reviewed plans in  place for chronic disease management-CHF, COPD and Diabetes

## 2015-04-19 NOTE — Patient Instructions (Signed)
Kidney Failure Kidney failure happens when the kidneys cannot remove waste and excess fluid that naturally builds up in your blood after your body breaks down food. This leads to a dangerous buildup of waste products and fluid in the blood. HOME CARE  Follow your diet as told by your doctor.  Take all medicines as told by your doctor.  Keep all of your dialysis appointments. Call if you are unable to keep an appointment. GET HELP RIGHT AWAY IF:   You make a lot more or very little pee (urine).  Your face or ankles puff up (swell).  You develop shortness of breath.  You develop weakness, feel tired, or you do not feel hungry (appetite loss).  You feel poorly for no known reason. MAKE SURE YOU:   Understand these instructions.  Will watch your condition.  Will get help right away if you are not doing well or get worse. Document Released: 02/04/2010 Document Revised: 02/02/2012 Document Reviewed: 03/13/2010 [facility] Patient Information 2015 [place], [place]. This information is not intended to replace advice given to you by your health care provider. Make sure you discuss any questions you have with your health care provider. Dialysis Dialysis is a procedure that replaces some of the work healthy kidneys do. It is done when you lose about 85-90% of your kidney function. It may also be done earlier if your symptoms may be improved by dialysis. During dialysis, wastes, salt, and extra water are removed from the blood, and the levels of certain chemicals in the blood (such as potassium) are maintained. Dialysis is done in sessions. Dialysis sessions are continued until the kidneys get better. If the kidneys cannot get better, such as in end-stage kidney disease, dialysis is continued for life or until you receive a new kidney (kidney transplant). There are two types of dialysis: hemodialysis and peritoneal dialysis. WHAT IS HEMODIALYSIS?  Hemodialysis is a type of dialysis in which a machine  called a dialyzer is used to filter the blood. Before beginning hemodialysis, you will have surgery to create a site where blood can be removed from the body and returned to the body (vascular access). There are three types of vascular accesses:  Arteriovenous fistula. To create this type of access, an artery is connected to a vein (usually in the arm). A fistula takes 1-6 months to develop after surgery. If it develops properly, it usually lasts longer than the other types of vascular accesses. It is also less likely to become infected and cause blood clots.  Arteriovenous graft. To create this type of access, an artery and a vein in the arm are connected with a tube. A graft may be used within 2-3 weeks of surgery.  A venous catheter. To create this type of access, a thin, flexible tube (catheter) is placed in a large vein in your neck, chest, or groin. A catheter may be used right away. It is usually used as a temporary access when dialysis needs to begin immediately. During hemodialysis, blood leaves the body through your access. It travels through a tube to the dialyzer, where it is filtered. The blood then returns to your body through another tube. Hemodialysis is usually performed by a health care provider at a hospital or dialysis center three times a week. Visits last about 3-4 hours. It may also be performed with the help of another person at home with training.  WHAT IS PERITONEAL DIALYSIS? Peritoneal dialysis is a type of dialysis in which the thin lining of the  abdomen (peritoneum) is used as a filter. Before beginning peritoneal dialysis, you will have surgery to place a catheter in your abdomen. The catheter will be used to transfer a fluid called dialysate to and from your abdomen. At the start of a session, your abdomen is filled with dialysate. During the session, wastes, salt, and extra water in the blood pass through the peritoneum and into the dialysate. The dialysate is drained from  the body at the end of the session. The process of filling and draining the dialysate is called an exchange. Exchanges are repeated until you have used up all the dialysate for the day. Peritoneal dialysis may be performed by you at home or at almost any other location. It is done every day. You may need up to five exchanges a day. The amount of time the dialysate is in your body between exchanges is called a dwell. The dwell depends on the number of exchanges needed and the characteristics of the peritoneum. It usually varies from 1.5-3 hours. You may go about your day normally between exchanges. Alternately, the exchanges may be done at night while you sleep, using a machine called a cycler. WHICH TYPE OF DIALYSIS SHOULD I CHOOSE?  Both hemodialysis and peritoneal dialysis have advantages and disadvantages. Talk to your health care provider about which type of dialysis would be best for you. Your lifestyle and preferences should be considered along with your medical condition. In some cases, only one type of dialysis may be an option.  Advantages of hemodialysis  It is done less often than peritoneal dialysis.  Someone else can do the dialysis for you.  If you go to a dialysis center, your health care provider will be able to recognize any problems right away.  If you go to a dialysis center, you can interact with others who are having dialysis. This can provide you with emotional support. Disadvantages of hemodialysis  Hemodialysis may cause cramps and low blood pressure. It may leave you feeling tired on the days you have the treatment.  If you go to a dialysis center, you will need to make weekly appointments and work around the center's schedule.  You will need to take extra care when traveling. If you go to a dialysis center, you will need to make special arrangements to visit a dialysis center near your destination. If you are having treatments at home, you will need to take the dialyzer  with you to your destination.  You will need to avoid more foods than you would need to avoid on peritoneal dialysis. Advantages of peritoneal dialysis  It is less likely than hemodialysis to cause cramps and low blood pressure.  You may do exchanges on your own wherever you are, including when you travel.  You do not need to avoid as many foods as you do on hemodialysis. Disadvantages of peritoneal dialysis  It is done more often than hemodialysis.  Performing peritoneal dialysis requires you to have dexterity of the hands. You must also be able to lift bags.  You will have to learn sterilization techniques. You will need to practice them every day to reduce the risk of infection. WHAT CHANGES WILL I NEED TO MAKE TO MY DIET DURING DIALYSIS? Both hemodialysis and peritoneal dialysis require you to make some changes to your diet. For example, you will need to limit your intake of foods high in the minerals phosphorus and potassium. You will also need to limit your fluid intake. Your dietitian can help you  plan meals. A good meal plan can improve your dialysis and your health.  WHAT SHOULD I EXPECT WHEN BEGINNING DIALYSIS? Adjusting to the dialysis treatment, schedule, and diet can take some time. You may need to stop working and may not be able to do some of the things you normally do. You may feel anxious or depressed when beginning dialysis. Eventually, many people feel better overall because of dialysis. Some people are able to return to work after making some changes, such as reducing work intensity. WHERE CAN I FIND MORE INFORMATION?   [place]: www.kidney.org  American Association of Kidney Patients: BombTimer.gl  [employer]: www.kidneyfund.org Document Released: 01/31/2003 Document Revised: 03/27/2014 Document Reviewed: 01/04/2013 [facility] Patient Information 2015 [place], [place]. This information is not intended to replace advice given to you by your  health care provider. Make sure you discuss any questions you have with your health care provider.  End-Stage Kidney Disease The kidneys are two organs that lie on either side of the spine between the middle of the back and the front of the abdomen. The kidneys:   Remove wastes and extra water from the blood.   Produce important hormones. These help keep bones strong, regulate blood pressure, and help create red blood cells.   Balance the fluids and chemicals in the blood and tissues. End-stage kidney disease occurs when the kidneys are so damaged that they cannot do their job. When the kidneys cannot do their job, life-threatening problems occur. The body cannot stay clean and strong without the help of the kidneys. In end-stage kidney disease, the kidneys cannot get better.You need a new kidney or treatments to do some of the work healthy kidneys do in order to stay alive. CAUSES  End-stage kidney disease usually occurs when a long-lasting (chronic) kidney disease gets worse. It may also occur after the kidneys are suddenly damaged (acute kidney injury).  SYMPTOMS   Swelling (edema) of the legs, ankles, or feet.   Tiredness (lethargy).   Nausea or vomiting.   Confusion.   Problems with urination, such as:   Decreased urine production.   Frequent urination, especially at night.   Frequent accidents in children who are potty trained.   Muscle twitches and cramps.   Persistent itchiness.   Loss of appetite.   Headaches.   Abnormally dark or light skin.   Numbness in the hands or feet.   Easy bruising.   Frequent hiccups.   Menstruation stops. DIAGNOSIS  Your health care provider will measure your blood pressure and take some tests. These may include:   Urine tests.   Blood tests.   Imaging tests, such as:   An ultrasound exam.   Computed tomography (CT).  A kidney biopsy. TREATMENT  There are two treatments for end-stage kidney  disease:   A procedure that removes toxic wastes from the body (dialysis).   Receiving a new kidney (kidney transplant). Both of these treatments have serious risks and consequences. Your health care provider will help you determine which treatment is best for you based on your health, age, and other factors. In addition to having dialysis or a kidney transplant, you may need to take medicines to control high blood pressure (hypertension) and cholesterol and to decrease phosphorus levels in your blood.  HOME CARE INSTRUCTIONS  Follow your prescribed diet.   Take medicines only as directed by your health care provider.   Do not take any new medicines (prescription, over-the-counter, or nutritional supplements) unless approved by your  health care provider. Many medicines can worsen your kidney damage or need to have the dose adjusted.   Keep all follow-up visits as directed by your health care provider. MAKE SURE YOU:  Understand these instructions.  Will watch your condition.  Will get help right away if you are not doing well or get worse. Document Released: 01/31/2004 Document Revised: 03/27/2014 Document Reviewed: 07/09/2012 [facility] Patient Information 2015 [place], [place]. This information is not intended to replace advice given to you by your health care provider. Make sure you discuss any questions you have with your health care provider.

## 2015-04-25 ENCOUNTER — Inpatient Hospital Stay (HOSPITAL_COMMUNITY)
Admission: EM | Admit: 2015-04-25 | Discharge: 2015-04-28 | DRG: 377 | Disposition: A | Discharge status: Home Health | Payer: Medicare Other | Attending: Internal Medicine | Admitting: Internal Medicine

## 2015-04-25 ENCOUNTER — Encounter (HOSPITAL_COMMUNITY): Payer: Self-pay | Admitting: Emergency Medicine

## 2015-04-25 ENCOUNTER — Emergency Department (HOSPITAL_COMMUNITY): Payer: Medicare Other

## 2015-04-25 DIAGNOSIS — N179 Acute kidney failure, unspecified: Secondary | ICD-10-CM | POA: Diagnosis present

## 2015-04-25 DIAGNOSIS — Z794 Long term (current) use of insulin: Secondary | ICD-10-CM | POA: Diagnosis not present

## 2015-04-25 DIAGNOSIS — I255 Ischemic cardiomyopathy: Secondary | ICD-10-CM | POA: Diagnosis present

## 2015-04-25 DIAGNOSIS — K921 Melena: Principal | ICD-10-CM | POA: Diagnosis present

## 2015-04-25 DIAGNOSIS — D631 Anemia in chronic kidney disease: Secondary | ICD-10-CM | POA: Diagnosis present

## 2015-04-25 DIAGNOSIS — E782 Mixed hyperlipidemia: Secondary | ICD-10-CM | POA: Diagnosis present

## 2015-04-25 DIAGNOSIS — I5042 Chronic combined systolic (congestive) and diastolic (congestive) heart failure: Secondary | ICD-10-CM

## 2015-04-25 DIAGNOSIS — N184 Chronic kidney disease, stage 4 (severe): Secondary | ICD-10-CM | POA: Diagnosis present

## 2015-04-25 DIAGNOSIS — Z681 Body mass index (BMI) 19 or less, adult: Secondary | ICD-10-CM

## 2015-04-25 DIAGNOSIS — E43 Unspecified severe protein-calorie malnutrition: Secondary | ICD-10-CM | POA: Diagnosis present

## 2015-04-25 DIAGNOSIS — Z87891 Personal history of nicotine dependence: Secondary | ICD-10-CM | POA: Diagnosis not present

## 2015-04-25 DIAGNOSIS — Z7982 Long term (current) use of aspirin: Secondary | ICD-10-CM

## 2015-04-25 DIAGNOSIS — E872 Acidosis: Secondary | ICD-10-CM | POA: Diagnosis present

## 2015-04-25 DIAGNOSIS — E109 Type 1 diabetes mellitus without complications: Secondary | ICD-10-CM | POA: Diagnosis present

## 2015-04-25 DIAGNOSIS — I252 Old myocardial infarction: Secondary | ICD-10-CM | POA: Diagnosis not present

## 2015-04-25 DIAGNOSIS — Z951 Presence of aortocoronary bypass graft: Secondary | ICD-10-CM | POA: Diagnosis not present

## 2015-04-25 DIAGNOSIS — E875 Hyperkalemia: Secondary | ICD-10-CM | POA: Diagnosis present

## 2015-04-25 DIAGNOSIS — Z833 Family history of diabetes mellitus: Secondary | ICD-10-CM | POA: Diagnosis not present

## 2015-04-25 DIAGNOSIS — N189 Chronic kidney disease, unspecified: Secondary | ICD-10-CM

## 2015-04-25 DIAGNOSIS — F0390 Unspecified dementia without behavioral disturbance: Secondary | ICD-10-CM | POA: Diagnosis present

## 2015-04-25 DIAGNOSIS — R569 Unspecified convulsions: Secondary | ICD-10-CM | POA: Diagnosis present

## 2015-04-25 DIAGNOSIS — K922 Gastrointestinal hemorrhage, unspecified: Secondary | ICD-10-CM | POA: Diagnosis not present

## 2015-04-25 DIAGNOSIS — Z79899 Other long term (current) drug therapy: Secondary | ICD-10-CM

## 2015-04-25 DIAGNOSIS — I129 Hypertensive chronic kidney disease with stage 1 through stage 4 chronic kidney disease, or unspecified chronic kidney disease: Secondary | ICD-10-CM | POA: Diagnosis present

## 2015-04-25 DIAGNOSIS — R195 Other fecal abnormalities: Secondary | ICD-10-CM | POA: Diagnosis not present

## 2015-04-25 DIAGNOSIS — Z8673 Personal history of transient ischemic attack (TIA), and cerebral infarction without residual deficits: Secondary | ICD-10-CM

## 2015-04-25 DIAGNOSIS — E46 Unspecified protein-calorie malnutrition: Secondary | ICD-10-CM | POA: Diagnosis present

## 2015-04-25 DIAGNOSIS — I10 Essential (primary) hypertension: Secondary | ICD-10-CM | POA: Diagnosis present

## 2015-04-25 DIAGNOSIS — R531 Weakness: Secondary | ICD-10-CM

## 2015-04-25 DIAGNOSIS — I2510 Atherosclerotic heart disease of native coronary artery without angina pectoris: Secondary | ICD-10-CM | POA: Diagnosis present

## 2015-04-25 DIAGNOSIS — D500 Iron deficiency anemia secondary to blood loss (chronic): Secondary | ICD-10-CM | POA: Insufficient documentation

## 2015-04-25 DIAGNOSIS — D62 Acute posthemorrhagic anemia: Secondary | ICD-10-CM | POA: Diagnosis present

## 2015-04-25 DIAGNOSIS — J9610 Chronic respiratory failure, unspecified whether with hypoxia or hypercapnia: Secondary | ICD-10-CM | POA: Diagnosis present

## 2015-04-25 DIAGNOSIS — Z823 Family history of stroke: Secondary | ICD-10-CM | POA: Diagnosis not present

## 2015-04-25 LAB — PREPARE RBC (CROSSMATCH)

## 2015-04-25 LAB — CBC WITH DIFFERENTIAL/PLATELET
Basophils Absolute: 0 10*3/uL (ref 0.0–0.1)
Basophils Relative: 1 % (ref 0–1)
Eosinophils Absolute: 0.1 10*3/uL (ref 0.0–0.7)
Eosinophils Relative: 1 % (ref 0–5)
HEMATOCRIT: 23.5 % — AB (ref 36.0–46.0)
HEMOGLOBIN: 7.1 g/dL — AB (ref 12.0–15.0)
Lymphocytes Relative: 28 % (ref 12–46)
Lymphs Abs: 1.6 10*3/uL (ref 0.7–4.0)
MCH: 27.7 pg (ref 26.0–34.0)
MCHC: 30.2 g/dL (ref 30.0–36.0)
MCV: 91.8 fL (ref 78.0–100.0)
Monocytes Absolute: 0.5 10*3/uL (ref 0.1–1.0)
Monocytes Relative: 9 % (ref 3–12)
NEUTROS ABS: 3.5 10*3/uL (ref 1.7–7.7)
Neutrophils Relative %: 61 % (ref 43–77)
PLATELETS: 230 10*3/uL (ref 150–400)
RBC: 2.56 MIL/uL — AB (ref 3.87–5.11)
RDW: 17.1 % — ABNORMAL HIGH (ref 11.5–15.5)
WBC: 5.7 10*3/uL (ref 4.0–10.5)

## 2015-04-25 LAB — COMPREHENSIVE METABOLIC PANEL
ALT: 14 U/L (ref 14–54)
AST: 22 U/L (ref 15–41)
Albumin: 3.5 g/dL (ref 3.5–5.0)
Alkaline Phosphatase: 91 U/L (ref 38–126)
Anion gap: 9 (ref 5–15)
BILIRUBIN TOTAL: 0.5 mg/dL (ref 0.3–1.2)
BUN: 134 mg/dL — ABNORMAL HIGH (ref 6–20)
CALCIUM: 8.7 mg/dL — AB (ref 8.9–10.3)
CO2: 16 mmol/L — ABNORMAL LOW (ref 22–32)
CREATININE: 4.93 mg/dL — AB (ref 0.44–1.00)
Chloride: 115 mmol/L — ABNORMAL HIGH (ref 101–111)
GFR calc non Af Amer: 8 mL/min — ABNORMAL LOW (ref >60)
GFR, EST AFRICAN AMERICAN: 9 mL/min — AB (ref >60)
Glucose, Bld: 127 mg/dL — ABNORMAL HIGH (ref 65–99)
Potassium: 5.6 mmol/L — ABNORMAL HIGH (ref 3.5–5.1)
Sodium: 140 mmol/L (ref 135–145)
TOTAL PROTEIN: 8.9 g/dL — AB (ref 6.5–8.1)

## 2015-04-25 LAB — POC OCCULT BLOOD, ED: Fecal Occult Bld: POSITIVE — AB

## 2015-04-25 LAB — APTT: APTT: 30 s (ref 24–37)

## 2015-04-25 LAB — PROTIME-INR
INR: 1.3 (ref 0.00–1.49)
Prothrombin Time: 16.3 seconds — ABNORMAL HIGH (ref 11.6–15.2)

## 2015-04-25 LAB — CBG MONITORING, ED: Glucose-Capillary: 119 mg/dL — ABNORMAL HIGH (ref 65–99)

## 2015-04-25 MED ORDER — SODIUM CHLORIDE 0.9 % IV SOLN
1000.0000 mL | INTRAVENOUS | Status: DC
  Administered 2015-04-25: 1000 mL via INTRAVENOUS

## 2015-04-25 MED ORDER — CALCITRIOL 0.25 MCG PO CAPS
0.2500 ug | ORAL_CAPSULE | Freq: Every day | ORAL | Status: DC
  Administered 2015-04-26 – 2015-04-28 (×2): 0.25 ug via ORAL
  Filled 2015-04-25 (×3): qty 1

## 2015-04-25 MED ORDER — SODIUM CHLORIDE 0.9 % IJ SOLN
3.0000 mL | Freq: Two times a day (BID) | INTRAMUSCULAR | Status: DC
  Administered 2015-04-26 – 2015-04-27 (×3): 3 mL via INTRAVENOUS

## 2015-04-25 MED ORDER — SODIUM CHLORIDE 0.9 % IV SOLN
Freq: Once | INTRAVENOUS | Status: AC
  Administered 2015-04-26: 1 mL via INTRAVENOUS

## 2015-04-25 MED ORDER — SODIUM CHLORIDE 0.9 % IV SOLN
INTRAVENOUS | Status: DC
  Administered 2015-04-25 – 2015-04-26 (×2): 1 mL via INTRAVENOUS

## 2015-04-25 MED ORDER — ONDANSETRON HCL 4 MG/2ML IJ SOLN
4.0000 mg | Freq: Once | INTRAMUSCULAR | Status: AC
  Administered 2015-04-25: 4 mg via INTRAVENOUS
  Filled 2015-04-25: qty 2

## 2015-04-25 MED ORDER — SODIUM CHLORIDE 0.9 % IV SOLN
80.0000 mg | Freq: Once | INTRAVENOUS | Status: AC
  Administered 2015-04-26: 80 mg via INTRAVENOUS
  Filled 2015-04-25: qty 80

## 2015-04-25 MED ORDER — ONDANSETRON HCL 4 MG/2ML IJ SOLN: 4.0000 mg | Freq: Four times a day (QID) | INTRAMUSCULAR | Status: DC | PRN

## 2015-04-25 MED ORDER — HYDRALAZINE HCL 25 MG PO TABS
75.0000 mg | ORAL_TABLET | Freq: Three times a day (TID) | ORAL | Status: DC
  Administered 2015-04-26 – 2015-04-28 (×4): 75 mg via ORAL
  Filled 2015-04-25: qty 1
  Filled 2015-04-25: qty 3
  Filled 2015-04-25: qty 1
  Filled 2015-04-25 (×3): qty 3
  Filled 2015-04-25 (×2): qty 1
  Filled 2015-04-25: qty 3
  Filled 2015-04-25 (×2): qty 1
  Filled 2015-04-25: qty 3

## 2015-04-25 MED ORDER — TORSEMIDE 20 MG PO TABS
60.0000 mg | ORAL_TABLET | Freq: Every day | ORAL | Status: DC
  Administered 2015-04-26: 60 mg via ORAL
  Filled 2015-04-25: qty 3

## 2015-04-25 MED ORDER — ONDANSETRON HCL 4 MG PO TABS: 4.0000 mg | ORAL_TABLET | Freq: Four times a day (QID) | ORAL | Status: DC | PRN

## 2015-04-25 MED ORDER — SODIUM CHLORIDE 0.9 % IV SOLN
8.0000 mg/h | INTRAVENOUS | Status: DC
  Administered 2015-04-26 – 2015-04-28 (×4): 8 mg/h via INTRAVENOUS
  Filled 2015-04-25 (×11): qty 80

## 2015-04-25 MED ORDER — LEVETIRACETAM 100 MG/ML PO SOLN
500.0000 mg | Freq: Two times a day (BID) | ORAL | Status: DC
  Administered 2015-04-26 – 2015-04-28 (×6): 500 mg via ORAL
  Filled 2015-04-25 (×13): qty 5

## 2015-04-25 MED ORDER — PANTOPRAZOLE SODIUM 40 MG IV SOLR
40.0000 mg | Freq: Two times a day (BID) | INTRAVENOUS | Status: DC
Start: 2015-04-29 — End: 2015-04-28
  Administered 2015-04-28: 40 mg via INTRAVENOUS
  Filled 2015-04-25: qty 40

## 2015-04-25 MED ORDER — HEPARIN SODIUM (PORCINE) 5000 UNIT/ML IJ SOLN
5000.0000 [IU] | Freq: Three times a day (TID) | INTRAMUSCULAR | Status: DC
  Administered 2015-04-26: 5000 [IU] via SUBCUTANEOUS
  Filled 2015-04-25: qty 1

## 2015-04-25 MED ORDER — METOPROLOL SUCCINATE ER 50 MG PO TB24
50.0000 mg | ORAL_TABLET | Freq: Every day | ORAL | Status: DC
  Administered 2015-04-26 – 2015-04-28 (×2): 50 mg via ORAL
  Filled 2015-04-25 (×3): qty 1

## 2015-04-25 MED ORDER — ACETAMINOPHEN 650 MG RE SUPP: 650.0000 mg | Freq: Four times a day (QID) | RECTAL | Status: DC | PRN

## 2015-04-25 MED ORDER — DONEPEZIL HCL 5 MG PO TABS
10.0000 mg | ORAL_TABLET | Freq: Every day | ORAL | Status: DC
  Administered 2015-04-26 – 2015-04-27 (×3): 10 mg via ORAL
  Filled 2015-04-25 (×3): qty 2
  Filled 2015-04-25 (×2): qty 1

## 2015-04-25 MED ORDER — INSULIN ASPART 100 UNIT/ML ~~LOC~~ SOLN
0.0000 [IU] | Freq: Three times a day (TID) | SUBCUTANEOUS | Status: DC
  Administered 2015-04-26: 1 [IU] via SUBCUTANEOUS
  Administered 2015-04-26: 2 [IU] via SUBCUTANEOUS
  Administered 2015-04-26 – 2015-04-28 (×3): 1 [IU] via SUBCUTANEOUS

## 2015-04-25 MED ORDER — ACETAMINOPHEN 325 MG PO TABS: 650.0000 mg | ORAL_TABLET | Freq: Four times a day (QID) | ORAL | Status: DC | PRN

## 2015-04-25 MED ORDER — ISOSORBIDE MONONITRATE ER 30 MG PO TB24
15.0000 mg | ORAL_TABLET | Freq: Every morning | ORAL | Status: DC
  Administered 2015-04-26 – 2015-04-28 (×2): 15 mg via ORAL
  Filled 2015-04-25 (×5): qty 1

## 2015-04-25 MED ORDER — PRAVASTATIN SODIUM 10 MG PO TABS
20.0000 mg | ORAL_TABLET | Freq: Every day | ORAL | Status: DC
  Administered 2015-04-26 – 2015-04-27 (×3): 20 mg via ORAL
  Filled 2015-04-25 (×2): qty 2
  Filled 2015-04-25: qty 1
  Filled 2015-04-25 (×2): qty 2
  Filled 2015-04-25: qty 1

## 2015-04-25 MED ORDER — INSULIN ASPART 100 UNIT/ML ~~LOC~~ SOLN: 0.0000 [IU] | Freq: Every day | SUBCUTANEOUS | Status: DC

## 2015-04-25 NOTE — ED Notes (Signed)
Sent here from family doctor with low Hemoglobin of 7.4

## 2015-04-25 NOTE — ED Provider Notes (Signed)
CSN: [id number]     Arrival date & time 04/25/15  1442 History   First MD Initiated Contact with Patient 04/25/15 1530     Chief Complaint  Patient presents with  . Abnormal Lab   HPI Pt went to her doctors office for a routine visit.  She had laboratory testing on Monday or Tuesday.  She saw her doctor today and was told to come to the ED because her blood count had dropped and her kidney function was worse.  Pt has been seeing Dr Cristela Felt to monitor her kidney disease but she is not on dialysis.  Pt still has been urinating OK.  No blood noticed in her stool.  Family feels that she has been looking weaker than usual.  Past Medical History  Diagnosis Date  . Type 2 diabetes mellitus   . Essential hypertension, benign   . History of stroke     Previously on Coumadin  . MI, old     Reported 36  . Seizure disorder   . Coronary atherosclerosis of native coronary artery     Multivessel status post CABG 2002  . History of GI bleed     Erosive gastritis and duodenitis by EGD 2002  . Mixed hyperlipidemia   . Ischemic cardiomyopathy     LVEF 40-45% March 2013  . Dementia   . CKD (chronic kidney disease) stage 4, GFR 15-29 ml/min   . CHF (congestive heart failure)   . Chronic respiratory failure with hypoxia   . Stroke   . Seizures   . Anemia of chronic renal failure, stage 4 (severe) 01/02/2015   Past Surgical History  Procedure Laterality Date  . Coronary artery bypass graft  July 2002    LIMA to LAD, SVG to OM1, SVG to OM 2, SVG to RCA  . Tee without cardioversion  01/28/2012    Procedure: TRANSESOPHAGEAL ECHOCARDIOGRAM (TEE);  Surgeon: Lelon Perla, MD;  Location: [facility] ENDOSCOPY;  Service: Cardiovascular;  Laterality: [name];  . Colonoscopy  2002  . Esophagogastroduodenoscopy  2002  . Esophagogastroduodenoscopy [name] 01/18/2014    DSK:AJGOT hiatal hernia. Abnormal gastric and duodenal bulbar mucosa of uncertain significance - status post gastric bx (chronic gastritis/H.pylori +  .  Colonoscopy [name] 06/25/2014    Dr. Laural Golden: tubular adenoma, pandiverticulosis.   . Agile capsule [name] 07/06/2014    Procedure: AGILE CAPSULE;  Surgeon: Danie Binder, MD;  Location: [facility] ENDO SUITE;  Service: Endoscopy;  Laterality: [name];  730  . Esophagogastroduodenoscopy [name] 07/20/2014    Dr. Gala Romney: tiny gastric polyps not manipulated. minimal pigmentation of duodenal bulb likely not significant  . Givens capsule study [name] 07/20/2014    incomplete   Family History  Problem Relation Age of Onset  . Stroke Father   . Diabetes type II Mother   . Colon cancer Neg Hx    History  Substance Use Topics  . Smoking status: Former Smoker -- 1.00 packs/day for 50 years    Types: Cigarettes    Start date: 02/22/1961    Quit date: 02/23/2012  . Smokeless tobacco: Never Used  . Alcohol Use: No   OB History    No data available     Review of Systems  Constitutional: Negative for fever.  Respiratory: Positive for cough.   Gastrointestinal: Negative for vomiting, abdominal pain, blood in stool and anal bleeding.  Genitourinary: Negative for dysuria.  Neurological: Negative for syncope.  All other systems reviewed and are negative.     Allergies  Review of patient's allergies indicates no known allergies.  Home Medications   Prior to Admission medications   Medication Sig Start Date End Date Taking? Authorizing Provider  aspirin EC 81 MG tablet Take 81 mg by mouth daily.   Yes Historical Provider, MD  calcitRIOL (ROCALTROL) 0.25 MCG capsule Take 1 capsule (0.25 mcg total) by mouth daily. 05/02/14  Yes Eugenie Filler, MD  donepezil (ARICEPT) 10 MG tablet Take 10 mg by mouth at bedtime.  01/30/14  Yes Historical Provider, MD  folic acid (FOLVITE) 1 MG tablet Take 1 mg by mouth every morning.    Yes Historical Provider, MD  hydrALAZINE (APRESOLINE) 25 MG tablet Take 75 mg by mouth every 8 (eight) hours.    Yes Historical Provider, MD  insulin detemir (LEVEMIR) 100 UNIT/ML injection Inject 0.08  mLs (8 Units total) into the skin at bedtime. 05/02/14  Yes Eugenie Filler, MD  isosorbide mononitrate (IMDUR) 30 MG 24 hr tablet Take 0.5 tablets (15 mg total) by mouth every morning. 01/09/15  Yes Lezlie Octave Black, NP  levETIRAcetam (KEPPRA) 100 MG/ML solution Take 500 mg by mouth 2 (two) times daily.  09/29/14  Yes Historical Provider, MD  linagliptin (TRADJENTA) 5 MG TABS tablet Take 5 mg by mouth every morning.    Yes Historical Provider, MD  Maltodextrin-Xanthan Gum (RESOURCE THICKENUP CLEAR) POWD Take 120 g by mouth as needed. Patient taking differently: Take 1 Can by mouth daily.  01/09/15  Yes Lezlie Octave Black, NP  metoprolol succinate (TOPROL-XL) 100 MG 24 hr tablet Take 50 mg by mouth daily. Take with or immediately following a meal.   Yes Historical Provider, MD  mupirocin ointment (BACTROBAN) 2 % Apply 1 application topically 2 (two) times daily.  01/03/15  Yes Historical Provider, MD  pantoprazole (PROTONIX) 40 MG tablet Take 40 mg by mouth every morning.   Yes Historical Provider, MD  pravastatin (PRAVACHOL) 20 MG tablet Take 20 mg by mouth at bedtime.    Yes Historical Provider, MD  potassium chloride (K-DUR) 10 MEQ tablet Take 1 tablet (10 mEq total) by mouth daily. 02/23/15   Lendon Colonel, NP  torsemide (DEMADEX) 20 MG tablet Take 3 tablets (60 mg total) by mouth daily. 03/09/15   Lendon Colonel, NP   BP 129/57 mmHg  Pulse 67  Temp(Src) 98.6 F (37 C) (Oral)  Resp 18  Ht 5\' 4"  (1.626 m)  Wt 112 lb (50.803 kg)  BMI 19.22 kg/m2  SpO2 100% Physical Exam  Constitutional: No distress.  HENT:  Head: Normocephalic and atraumatic.  Right Ear: External ear normal.  Left Ear: External ear normal.  Eyes: Conjunctivae are normal. Right eye exhibits no discharge. Left eye exhibits no discharge. No scleral icterus.  Neck: Neck supple. No tracheal deviation present.  Cardiovascular: Normal rate, regular rhythm and intact distal pulses.   Pulmonary/Chest: Effort normal and breath  sounds normal. No stridor. No respiratory distress. She has no wheezes. She has no rales.  Abdominal: Soft. Bowel sounds are normal. She exhibits no distension. There is no tenderness. There is no rebound and no guarding.  Musculoskeletal: She exhibits no edema or tenderness.  Dark stools   Neurological: She is alert. She has normal strength. No cranial nerve deficit (no facial droop, extraocular movements intact, no slurred speech) or sensory deficit. She exhibits normal muscle tone. She displays no seizure activity. Coordination normal.  Skin: Skin is warm and dry. No rash noted. She is not diaphoretic.  Psychiatric: She has  a normal mood and affect.  Nursing note and vitals reviewed.   ED Course  Procedures (including critical care time) Labs Review Labs Reviewed  COMPREHENSIVE METABOLIC PANEL - Abnormal; Notable for the following:    Potassium 5.6 (*)    Chloride 115 (*)    CO2 16 (*)    Glucose, Bld 127 (*)    BUN 134 (*)    Creatinine, Ser 4.93 (*)    Calcium 8.7 (*)    Total Protein 8.9 (*)    GFR calc non Af Amer 8 (*)    GFR calc Af Amer 9 (*)    All other components within normal limits  CBC WITH DIFFERENTIAL/PLATELET - Abnormal; Notable for the following:    RBC 2.56 (*)    Hemoglobin 7.1 (*)    HCT 23.5 (*)    RDW 17.1 (*)    All other components within normal limits  PROTIME-INR - Abnormal; Notable for the following:    Prothrombin Time 16.3 (*)    All other components within normal limits  POC OCCULT BLOOD, ED - Abnormal; Notable for the following:    Fecal Occult Bld POSITIVE (*)    All other components within normal limits  APTT  TYPE AND SCREEN  PREPARE RBC (CROSSMATCH)    Imaging Review Dg Chest Portable 1 View  04/25/2015   CLINICAL DATA:  Low hemoglobin.  Confusion.  Cardiomyopathy.  EXAM: PORTABLE CHEST - 1 VIEW  COMPARISON:  01/02/2015  FINDINGS: Moderate enlargement of the cardiopericardial silhouette, cardiothoracic index 66% on the AP view.  Prior  CABG.  Atherosclerotic calcification of the aortic arch.  Upper zone pulmonary vascular prominence but without findings of interstitial or airspace edema.  IMPRESSION: 1. Moderate enlargement of the cardiopericardial silhouette with pulmonary venous hypertension but without overt edema. 2. Atherosclerotic arch. 3. Prior CABG.   Electronically Signed   By: Van Clines M.D.   On: 04/25/2015 16:29    CRITICAL CARE Performed by: ZOXWR,UEA Total critical care time: 30 Critical care time was exclusive of separately billable procedures and treating other patients. Critical care was necessary to treat or prevent imminent or life-threatening deterioration. Critical care was time spent personally by me on the following activities: development of treatment plan with patient and/or surrogate as well as nursing, discussions with consultants, evaluation of patient's response to treatment, examination of patient, obtaining history from patient or surrogate, ordering and performing treatments and interventions, ordering and review of laboratory studies, ordering and review of radiographic studies, pulse oximetry and re-evaluation of patient's condition.  Medications  0.9 %  sodium chloride infusion (1,000 mLs Intravenous New Bag/Given 04/25/15 1634)  0.9 %  sodium chloride infusion (not administered)  ondansetron (ZOFRAN) injection 4 mg (4 mg Intravenous Given 04/25/15 1635)    MDM   Final diagnoses:  Blood loss anemia  Acute on chronic renal failure    Pt's hgb has dropped form 8 to 7.1.  Her renal failure is significantly worse with worsening bun, cr and metabolic acidosis.  Pt may end up requiring dialysis.    Guaic test is positive.  She may have a component of GI bleeding.  She is on [place].  Will transufse 1 unit prbc.  Admit for further treatment    Dorie Rank, MD 04/25/15 1800

## 2015-04-25 NOTE — H&P (Signed)
[facility] History and Physical  KORBYN VANES [name]:[id number] DOB: 09-09-1941 DOA: 04/25/2015  Referring physician: Dr Tomi Bamberger - [facility] PCP: [name], MD   Chief Complaint: Weakness and anemia  HPI: [name] is a 74 y.o. female  Patient is presenting today per request by her home doctor due to "lab abnormalities ". Of note patient states that her hemoglobin has apparently dropped significantly and her kidney function is worse. He is also complaining of generalized weakness over the last 2 days. The symptoms are constant and getting worse. Denies any melena or hematochezia, she continues to make urine on a regular basis. Oral intake is preserved. Patient has not tried anything for her symptoms. Per report patient's hemoglobin 2 days ago was 10.   Review of Systems:  Constitutional:  No night sweats, Fevers, chills, fatigue.  HEENT:  No headaches, Difficulty swallowing,Tooth/dental problems,Sore throat,  No sneezing, itching, ear ache, nasal congestion, post nasal drip,  Cardio-vascular:  No chest pain, Orthopnea, PND, swelling in lower extremities, anasarca, dizziness, palpitations  GI:  No heartburn, indigestion, abdominal pain, nausea, vomiting, diarrhea, change in bowel habits, loss of appetite  Resp:   No shortness of breath with exertion or at rest. No excess mucus, no productive cough, No non-productive cough, No coughing up of blood.No change in color of mucus.No wheezing.No chest wall deformity  Skin:  no rash or lesions.  GU:  no dysuria, change in color of urine, no urgency or frequency. No flank pain.  Musculoskeletal:   No joint pain or swelling. No decreased range of motion. No back pain.  Psych:  No change in mood or affect. No depression or anxiety. No memory loss.   Past Medical History  Diagnosis Date  . Type 2 diabetes mellitus   . Essential hypertension, benign   . History of stroke     Previously on Coumadin  . MI, old     Reported 28  .  Seizure disorder   . Coronary atherosclerosis of native coronary artery     Multivessel status post CABG 2002  . History of GI bleed     Erosive gastritis and duodenitis by EGD 2002  . Mixed hyperlipidemia   . Ischemic cardiomyopathy     LVEF 40-45% March 2013  . Dementia   . CKD (chronic kidney disease) stage 4, GFR 15-29 ml/min   . CHF (congestive heart failure)   . Chronic respiratory failure with hypoxia   . Stroke   . Seizures   . Anemia of chronic renal failure, stage 4 (severe) 01/02/2015   Past Surgical History  Procedure Laterality Date  . Coronary artery bypass graft  July 2002    LIMA to LAD, SVG to OM1, SVG to OM 2, SVG to RCA  . Tee without cardioversion  01/28/2012    Procedure: TRANSESOPHAGEAL ECHOCARDIOGRAM (TEE);  Surgeon: Lelon Perla, MD;  Location: [facility] ENDOSCOPY;  Service: Cardiovascular;  Laterality: [name];  . Colonoscopy  2002  . Esophagogastroduodenoscopy  2002  . Esophagogastroduodenoscopy [name] 01/18/2014    NFA:OZHYQ hiatal hernia. Abnormal gastric and duodenal bulbar mucosa of uncertain significance - status post gastric bx (chronic gastritis/H.pylori +  . Colonoscopy [name] 06/25/2014    Dr. Laural Golden: tubular adenoma, pandiverticulosis.   . Agile capsule [name] 07/06/2014    Procedure: AGILE CAPSULE;  Surgeon: Danie Binder, MD;  Location: [facility];  Service: Endoscopy;  Laterality: [name];  730  . Esophagogastroduodenoscopy [name] 07/20/2014    Dr. Gala Romney: tiny gastric polyps not  manipulated. minimal pigmentation of duodenal bulb likely not significant  . Givens capsule study [name] 07/20/2014    incomplete   Social History:  reports that she quit smoking about 3 years ago. Her smoking use included Cigarettes. She started smoking about 54 years ago. She has a 50 pack-year smoking history. She has never used smokeless tobacco. She reports that she does not drink alcohol or use illicit drugs.  No Known Allergies  Family History  Problem Relation Age of Onset  . Stroke  Father   . Diabetes type II Mother   . Colon cancer Neg Hx      Prior to Admission medications   Medication Sig Start Date End Date Taking? Authorizing Provider  aspirin EC 81 MG tablet Take 81 mg by mouth daily.   Yes Historical Provider, MD  calcitRIOL (ROCALTROL) 0.25 MCG capsule Take 1 capsule (0.25 mcg total) by mouth daily. 05/02/14  Yes Eugenie Filler, MD  donepezil (ARICEPT) 10 MG tablet Take 10 mg by mouth at bedtime.  01/30/14  Yes Historical Provider, MD  folic acid (FOLVITE) 1 MG tablet Take 1 mg by mouth every morning.    Yes Historical Provider, MD  hydrALAZINE (APRESOLINE) 25 MG tablet Take 75 mg by mouth every 8 (eight) hours.    Yes Historical Provider, MD  insulin detemir (LEVEMIR) 100 UNIT/ML injection Inject 0.08 mLs (8 Units total) into the skin at bedtime. 05/02/14  Yes Eugenie Filler, MD  isosorbide mononitrate (IMDUR) 30 MG 24 hr tablet Take 0.5 tablets (15 mg total) by mouth every morning. 01/09/15  Yes Lezlie Octave Black, NP  levETIRAcetam (KEPPRA) 100 MG/ML solution Take 500 mg by mouth 2 (two) times daily.  09/29/14  Yes Historical Provider, MD  linagliptin (TRADJENTA) 5 MG TABS tablet Take 5 mg by mouth every morning.    Yes Historical Provider, MD  Maltodextrin-Xanthan Gum (RESOURCE THICKENUP CLEAR) POWD Take 120 g by mouth as needed. Patient taking differently: Take 1 Can by mouth daily.  01/09/15  Yes Lezlie Octave Black, NP  metoprolol succinate (TOPROL-XL) 100 MG 24 hr tablet Take 50 mg by mouth daily. Take with or immediately following a meal.   Yes Historical Provider, MD  mupirocin ointment (BACTROBAN) 2 % Apply 1 application topically 2 (two) times daily.  01/03/15  Yes Historical Provider, MD  pantoprazole (PROTONIX) 40 MG tablet Take 40 mg by mouth every morning.   Yes Historical Provider, MD  pravastatin (PRAVACHOL) 20 MG tablet Take 20 mg by mouth at bedtime.    Yes Historical Provider, MD  potassium chloride (K-DUR) 10 MEQ tablet Take 1 tablet (10 mEq total) by mouth  daily. 02/23/15   Lendon Colonel, NP  torsemide (DEMADEX) 20 MG tablet Take 3 tablets (60 mg total) by mouth daily. 03/09/15   Lendon Colonel, NP   Physical Exam: Filed Vitals:   04/25/15 1512 04/25/15 1707 04/25/15 1826  BP: 115/40 129/57 107/52  Pulse: 65 67 64  Temp: 98.6 F (37 C)    TempSrc: Oral    Resp: 20 18 18   Height: 5\' 4"  (1.626 m)    Weight: 50.803 kg (112 lb)    SpO2: 100% 100% 99%    Wt Readings from Last 3 Encounters:  04/25/15 50.803 kg (112 lb)  04/19/15 54.432 kg (120 lb)  04/16/15 52.98 kg (116 lb 12.8 oz)    General: Weak and frail-appearing Eyes:  PERRL, normal lids, irises & conjunctiva ENT: Dry mucous membranes Neck:  no LAD,  masses or thyromegaly Cardiovascular: Faint heart sounds, 2/6 systolic murmur, RRR, Telemetry:  SR, no arrhythmias  Respiratory:  CTA bilaterally, no w/r/r. Normal respiratory effort. Abdomen:  soft, ntnd Skin:  no rash or induration seen on limited exam Musculoskeletal: Poor muscle tone throughout but able to sit up unassisted. Psychiatric:  grossly normal mood and affect, speech fluent and appropriate Neurologic:  grossly non-focal.          Labs on Admission:  Basic Metabolic Panel:  Recent Labs Lab 04/25/15 1643  NA 140  K 5.6*  CL 115*  CO2 16*  GLUCOSE 127*  BUN 134*  CREATININE 4.93*  CALCIUM 8.7*   Liver Function Tests:  Recent Labs Lab 04/25/15 1643  AST 22  ALT 14  ALKPHOS 91  BILITOT 0.5  PROT 8.9*  ALBUMIN 3.5   No results for input(s): LIPASE, AMYLASE in the last 168 hours. No results for input(s): AMMONIA in the last 168 hours. CBC:  Recent Labs Lab 04/25/15 1643  WBC 5.7  NEUTROABS 3.5  HGB 7.1*  HCT 23.5*  MCV 91.8  PLT 230   Cardiac Enzymes: No results for input(s): CKTOTAL, CKMB, CKMBINDEX, TROPONINI in the last 168 hours.  BNP (last 3 results)  Recent Labs  01/02/15 1125  BNP >4500.0*    ProBNP (last 3 results)  Recent Labs  04/27/14 1536 06/09/14 1021    PROBNP 51631.0* 30358.0*    CBG: No results for input(s): GLUCAP in the last 168 hours.  Radiological Exams on Admission: Dg Chest Portable 1 View  04/25/2015   CLINICAL DATA:  Low hemoglobin.  Confusion.  Cardiomyopathy.  EXAM: PORTABLE CHEST - 1 VIEW  COMPARISON:  01/02/2015  FINDINGS: Moderate enlargement of the cardiopericardial silhouette, cardiothoracic index 66% on the AP view.  Prior CABG.  Atherosclerotic calcification of the aortic arch.  Upper zone pulmonary vascular prominence but without findings of interstitial or airspace edema.  IMPRESSION: 1. Moderate enlargement of the cardiopericardial silhouette with pulmonary venous hypertension but without overt edema. 2. Atherosclerotic arch. 3. Prior CABG.   Electronically Signed   By: Van Clines M.D.   On: 04/25/2015 16:29      Assessment/Plan Active Problems:   GI bleeding   Weakness generalized   Dementia   Acute renal failure superimposed on stage 4 chronic kidney disease   Seizure   History of CVA (cerebrovascular accident)   Generalized weakness   Protein calorie malnutrition   Type I diabetes mellitus, well controlled   Hyperkalemia   Essential hypertension   Chronic combined systolic and diastolic CHF (congestive heart failure)   GI bleed   Anemia in chronic kidney disease (CKD)   Anemia: Hgb 7.1. Per report patient's hemoglobin was 10 just a few days ago. Hemoccult positive. Patient with baseline anemia secondary to stage IV chronic any disease. Patient with history of GI bleeds in the past. Patient followed by H/Onc receives Aranesp injections. ED has ordered 1 units PRBC. Upper endoscopy from 01/18/2014 without evidence of ulceration but showing H. pylori infection. Colonoscopy from 06/26/2014 showing benign tubular adenoma. - Telemetry - Follow-up labs from blood transfusion - IV Protonix - Consult Dr.Rehman in the am (He did the previous scopes) - hold ASA  Generalized weakness: Likely due to anemia  and Protein calorie malnutrition and physical deconditioning - Nutrition consult, PT/OT  Acute on chronic kidney disease stage IV: Creatinine 4.93 on admission. Baseline 3.7. BUN 134. Bicarb 16. Likely secondary to poor perfusion secondary to severe anemia. Making urine but  no fistula present. No signs of uremia - Renal consult. (Dr Lowanda Foster aware of pt)  DM: Last A1c 7.8. On Tradjenta and Levemir - SSI - A1c  Hypertension: Normotensive - Continue Imdur, metoprolol, hydralazine  Hyperkalemia: - Gentle hydration NS 44ml/hr - hold home Kdur  Chronic systolic and diastolic just of heart failure: Last echo on 06/07/2014 year ago showing 40-45% EF and grade 2 diastolic dysfunction. No sign of acute exacerbation - Continue torsemide,  Beta blocker   Seizures: Last seizure several years ago. - Continue Keppra  Dementia: - Continue Aricept  HLD: - continue statin   Code Status: FULL - PT APPARENTLY CODED DURING LAST EGD AND DAUGHTER REAFFIRMED THAT PT IS A FULL CODE.  DVT Prophylaxis: Hep Family Communication: Daughter Disposition Plan: pending improvement   Elenora Hawbaker Lenna Sciara, MD Family Medicine [facility] [email or web address] Password [email or web address]

## 2015-04-26 DIAGNOSIS — D500 Iron deficiency anemia secondary to blood loss (chronic): Secondary | ICD-10-CM

## 2015-04-26 DIAGNOSIS — R195 Other fecal abnormalities: Secondary | ICD-10-CM

## 2015-04-26 LAB — COMPREHENSIVE METABOLIC PANEL
ALBUMIN: 3.4 g/dL — AB (ref 3.5–5.0)
ALT: 14 U/L (ref 14–54)
AST: 25 U/L (ref 15–41)
Alkaline Phosphatase: 106 U/L (ref 38–126)
Anion gap: 9 (ref 5–15)
BILIRUBIN TOTAL: 0.6 mg/dL (ref 0.3–1.2)
BUN: 124 mg/dL — AB (ref 6–20)
CO2: 16 mmol/L — AB (ref 22–32)
Calcium: 8.3 mg/dL — ABNORMAL LOW (ref 8.9–10.3)
Chloride: 114 mmol/L — ABNORMAL HIGH (ref 101–111)
Creatinine, Ser: 4.59 mg/dL — ABNORMAL HIGH (ref 0.44–1.00)
GFR calc Af Amer: 10 mL/min — ABNORMAL LOW (ref >60)
GFR calc non Af Amer: 9 mL/min — ABNORMAL LOW (ref >60)
GLUCOSE: 126 mg/dL — AB (ref 65–99)
POTASSIUM: 5.8 mmol/L — AB (ref 3.5–5.1)
Sodium: 139 mmol/L (ref 135–145)
Total Protein: 8.8 g/dL — ABNORMAL HIGH (ref 6.5–8.1)

## 2015-04-26 LAB — GLUCOSE, CAPILLARY
GLUCOSE-CAPILLARY: 124 mg/dL — AB (ref 65–99)
Glucose-Capillary: 130 mg/dL — ABNORMAL HIGH (ref 65–99)
Glucose-Capillary: 159 mg/dL — ABNORMAL HIGH (ref 65–99)
Glucose-Capillary: 182 mg/dL — ABNORMAL HIGH (ref 65–99)

## 2015-04-26 LAB — CBC
HEMATOCRIT: 27.9 % — AB (ref 36.0–46.0)
Hemoglobin: 8.7 g/dL — ABNORMAL LOW (ref 12.0–15.0)
MCH: 28.2 pg (ref 26.0–34.0)
MCHC: 31.2 g/dL (ref 30.0–36.0)
MCV: 90.6 fL (ref 78.0–100.0)
Platelets: 203 10*3/uL (ref 150–400)
RBC: 3.08 MIL/uL — ABNORMAL LOW (ref 3.87–5.11)
RDW: 16.2 % — ABNORMAL HIGH (ref 11.5–15.5)
WBC: 5.7 10*3/uL (ref 4.0–10.5)

## 2015-04-26 MED ORDER — PANTOPRAZOLE SODIUM 40 MG IV SOLR
INTRAVENOUS | Status: AC
  Filled 2015-04-26: qty 160

## 2015-04-26 MED ORDER — STERILE WATER FOR INJECTION IV SOLN
INTRAVENOUS | Status: DC
  Administered 2015-04-26 – 2015-04-28 (×5): via INTRAVENOUS
  Filled 2015-04-26 (×13): qty 9.7

## 2015-04-26 MED ORDER — FUROSEMIDE 10 MG/ML IJ SOLN
160.0000 mg | Freq: Two times a day (BID) | INTRAVENOUS | Status: DC
  Administered 2015-04-26 – 2015-04-28 (×4): 160 mg via INTRAVENOUS
  Filled 2015-04-26 (×8): qty 16

## 2015-04-26 MED ORDER — BOOST / RESOURCE BREEZE PO LIQD
1.0000 | Freq: Two times a day (BID) | ORAL | Status: DC
  Administered 2015-04-28 (×2): 1 via ORAL

## 2015-04-26 MED ORDER — SODIUM POLYSTYRENE SULFONATE 15 GM/60ML PO SUSP
30.0000 g | Freq: Once | ORAL | Status: AC
  Administered 2015-04-26: 30 g via ORAL
  Filled 2015-04-26: qty 120

## 2015-04-26 NOTE — Consult Note (Signed)
Reason for Consult: Hyperkalemia and worsening of renal failure Referring Physician: Dr. Angelena Sole [name] is an 74 y.o. female.  HPI: She is a patient who has history of dementia, CVA, stage IV chronic renal failure presently was brought because of worsening of renal failure, hyperkalemia and severe anemia. Presently patient offers no complaints. She denies any nausea or vomiting. Patient also denies any difficulty breathing. However since she has dementia most of the time she doesn't give any complaints.  Past Medical History  Diagnosis Date  . Type 2 diabetes mellitus   . Essential hypertension, benign   . History of stroke     Previously on Coumadin  . MI, old     Reported 71  . Seizure disorder   . Coronary atherosclerosis of native coronary artery     Multivessel status post CABG 2002  . History of GI bleed     Erosive gastritis and duodenitis by EGD 2002  . Mixed hyperlipidemia   . Ischemic cardiomyopathy     LVEF 40-45% March 2013  . Dementia   . CKD (chronic kidney disease) stage 4, GFR 15-29 ml/min   . CHF (congestive heart failure)   . Chronic respiratory failure with hypoxia   . Stroke   . Seizures   . Anemia of chronic renal failure, stage 4 (severe) 01/02/2015    Past Surgical History  Procedure Laterality Date  . Coronary artery bypass graft  July 2002    LIMA to LAD, SVG to OM1, SVG to OM 2, SVG to RCA  . Tee without cardioversion  01/28/2012    Procedure: TRANSESOPHAGEAL ECHOCARDIOGRAM (TEE);  Surgeon: Lelon Perla, MD;  Location: [facility] ENDOSCOPY;  Service: Cardiovascular;  Laterality: [name];  . Colonoscopy  2002  . Esophagogastroduodenoscopy  2002  . Esophagogastroduodenoscopy [name] 01/18/2014    RDE:YCXKG hiatal hernia. Abnormal gastric and duodenal bulbar mucosa of uncertain significance - status post gastric bx (chronic gastritis/H.pylori +  . Colonoscopy [name] 06/25/2014    Dr. Laural Golden: tubular adenoma, pandiverticulosis.   . Agile capsule [name] 07/06/2014   Procedure: AGILE CAPSULE;  Surgeon: Danie Binder, MD;  Location: [facility]O [facility];  Service: Endoscopy;  Laterality: [name];  730  . Esophagogastroduodenoscopy [name] 07/20/2014    Dr. Gala Romney: tiny gastric polyps not manipulated. minimal pigmentation of duodenal bulb likely not significant  . Givens capsule study [name] 07/20/2014    incomplete    Family History  Problem Relation Age of Onset  . Stroke Father   . Diabetes type II Mother   . Colon cancer Neg Hx     Social History:  reports that she quit smoking about 3 years ago. Her smoking use included Cigarettes. She started smoking about 54 years ago. She has a 50 pack-year smoking history. She has never used smokeless tobacco. She reports that she does not drink alcohol or use illicit drugs.  Allergies: No Known Allergies  Medications: I have reviewed the patient's current medications.  Results for orders placed or performed during the hospital encounter of 04/25/15 (from the past 48 hour(s))  Comprehensive metabolic panel     Status: Abnormal   Collection Time: 04/25/15  4:43 PM  Result Value Ref Range   Sodium 140 135 - 145 mmol/L   Potassium 5.6 (H) 3.5 - 5.1 mmol/L   Chloride 115 (H) 101 - 111 mmol/L   CO2 16 (L) 22 - 32 mmol/L   Glucose, Bld 127 (H) 65 - 99 mg/dL   BUN 134 (H) 6 -  20 mg/dL    Comment: RESULTS CONFIRMED BY MANUAL DILUTION   Creatinine, Ser 4.93 (H) 0.44 - 1.00 mg/dL   Calcium 8.7 (L) 8.9 - 10.3 mg/dL   Total Protein 8.9 (H) 6.5 - 8.1 g/dL   Albumin 3.5 3.5 - 5.0 g/dL   AST 22 15 - 41 U/L   ALT 14 14 - 54 U/L   Alkaline Phosphatase 91 38 - 126 U/L   Total Bilirubin 0.5 0.3 - 1.2 mg/dL   GFR calc non Af Amer 8 (L) >60 mL/min   GFR calc Af Amer 9 (L) >60 mL/min    Comment: (NOTE) The eGFR has been calculated using the CKD EPI equation. This calculation has not been validated in all clinical situations. eGFR's persistently <60 mL/min signify possible Chronic Kidney Disease.    Anion gap 9 5 - 15  CBC WITH  DIFFERENTIAL     Status: Abnormal   Collection Time: 04/25/15  4:43 PM  Result Value Ref Range   WBC 5.7 4.0 - 10.5 K/uL   RBC 2.56 (L) 3.87 - 5.11 MIL/uL   Hemoglobin 7.1 (L) 12.0 - 15.0 g/dL   HCT 23.5 (L) 36.0 - 46.0 %   MCV 91.8 78.0 - 100.0 fL   MCH 27.7 26.0 - 34.0 pg   MCHC 30.2 30.0 - 36.0 g/dL   RDW 17.1 (H) 11.5 - 15.5 %   Platelets 230 150 - 400 K/uL   Neutrophils Relative % 61 43 - 77 %   Neutro Abs 3.5 1.7 - 7.7 K/uL   Lymphocytes Relative 28 12 - 46 %   Lymphs Abs 1.6 0.7 - 4.0 K/uL   Monocytes Relative 9 3 - 12 %   Monocytes Absolute 0.5 0.1 - 1.0 K/uL   Eosinophils Relative 1 0 - 5 %   Eosinophils Absolute 0.1 0.0 - 0.7 K/uL   Basophils Relative 1 0 - 1 %   Basophils Absolute 0.0 0.0 - 0.1 K/uL  APTT     Status: None   Collection Time: 04/25/15  4:43 PM  Result Value Ref Range   aPTT 30 24 - 37 seconds  Protime-INR     Status: Abnormal   Collection Time: 04/25/15  4:43 PM  Result Value Ref Range   Prothrombin Time 16.3 (H) 11.6 - 15.2 seconds   INR 1.30 0.00 - 1.49  Type and screen     Status: None (Preliminary result)   Collection Time: 04/25/15  4:43 PM  Result Value Ref Range   ABO/RH(D) A POS    Antibody Screen NEG    Sample Expiration 04/28/2015    Unit Number [id number]    Blood Component Type RED CELLS,LR    Unit division 00    Status of Unit ISSUED    Transfusion Status OK TO TRANSFUSE    Crossmatch Result Compatible    Unit Number [id number]    Blood Component Type RED CELLS,LR    Unit division 00    Status of Unit ALLOCATED    Transfusion Status OK TO TRANSFUSE    Crossmatch Result Compatible   POC occult blood, ED     Status: Abnormal   Collection Time: 04/25/15  5:56 PM  Result Value Ref Range   Fecal Occult Bld POSITIVE (A) NEGATIVE  Prepare RBC     Status: None   Collection Time: 04/25/15  6:30 PM  Result Value Ref Range   Order Confirmation ORDER PROCESSED BY BLOOD BANK   CBG monitoring, ED  Status: Abnormal    Collection Time: 04/25/15  7:28 PM  Result Value Ref Range   Glucose-Capillary 119 (H) 65 - 99 mg/dL  CBC     Status: Abnormal   Collection Time: 04/26/15  7:35 AM  Result Value Ref Range   WBC 5.7 4.0 - 10.5 K/uL   RBC 3.08 (L) 3.87 - 5.11 MIL/uL   Hemoglobin 8.7 (L) 12.0 - 15.0 g/dL   HCT 27.9 (L) 36.0 - 46.0 %   MCV 90.6 78.0 - 100.0 fL   MCH 28.2 26.0 - 34.0 pg   MCHC 31.2 30.0 - 36.0 g/dL   RDW 16.2 (H) 11.5 - 15.5 %   Platelets 203 150 - 400 K/uL  Comprehensive metabolic panel     Status: Abnormal   Collection Time: 04/26/15  7:35 AM  Result Value Ref Range   Sodium 139 135 - 145 mmol/L   Potassium 5.8 (H) 3.5 - 5.1 mmol/L   Chloride 114 (H) 101 - 111 mmol/L   CO2 16 (L) 22 - 32 mmol/L   Glucose, Bld 126 (H) 65 - 99 mg/dL   BUN 124 (H) 6 - 20 mg/dL    Comment: RESULTS CONFIRMED BY MANUAL DILUTION   Creatinine, Ser 4.59 (H) 0.44 - 1.00 mg/dL   Calcium 8.3 (L) 8.9 - 10.3 mg/dL   Total Protein 8.8 (H) 6.5 - 8.1 g/dL   Albumin 3.4 (L) 3.5 - 5.0 g/dL   AST 25 15 - 41 U/L   ALT 14 14 - 54 U/L   Alkaline Phosphatase 106 38 - 126 U/L   Total Bilirubin 0.6 0.3 - 1.2 mg/dL   GFR calc non Af Amer 9 (L) >60 mL/min   GFR calc Af Amer 10 (L) >60 mL/min    Comment: (NOTE) The eGFR has been calculated using the CKD EPI equation. This calculation has not been validated in all clinical situations. eGFR's persistently <60 mL/min signify possible Chronic Kidney Disease.    Anion gap 9 5 - 15  Glucose, capillary     Status: Abnormal   Collection Time: 04/26/15  7:55 AM  Result Value Ref Range   Glucose-Capillary 124 (H) 65 - 99 mg/dL   Comment 1 Notify RN     Dg Chest Portable 1 View  04/25/2015   CLINICAL DATA:  Low hemoglobin.  Confusion.  Cardiomyopathy.  EXAM: PORTABLE CHEST - 1 VIEW  COMPARISON:  01/02/2015  FINDINGS: Moderate enlargement of the cardiopericardial silhouette, cardiothoracic index 66% on the AP view.  Prior CABG.  Atherosclerotic calcification of the aortic arch.   Upper zone pulmonary vascular prominence but without findings of interstitial or airspace edema.  IMPRESSION: 1. Moderate enlargement of the cardiopericardial silhouette with pulmonary venous hypertension but without overt edema. 2. Atherosclerotic arch. 3. Prior CABG.   Electronically Signed   By: Van Clines M.D.   On: 04/25/2015 16:29    Review of Systems  Constitutional: Negative for fever.  Respiratory: Negative for shortness of breath.   Cardiovascular: Negative for chest pain and orthopnea.  Gastrointestinal: Negative for nausea and vomiting.  Neurological: Positive for weakness.   Blood pressure 110/54, pulse 66, temperature 97.8 F (36.6 C), temperature source Oral, resp. rate 20, height 5' 4"  (1.626 m), weight 52.6 kg (115 lb 15.4 oz), SpO2 100 %. Physical Exam  Constitutional: No distress.  Eyes: No scleral icterus.  Neck: No JVD present.  Cardiovascular: Normal rate and regular rhythm.   Respiratory: She has no wheezes. She has no rales.  Musculoskeletal: She exhibits no edema.  Neurological: She is alert.    Assessment/Plan: Problem #1 acute kidney injury superimposed on chronic. Possible likely prerenal however ATN cannot be ruled out. Patient has been on diuretics as an outpatient. Presently patient is nonoliguric and her BUN and creatinine is improving. Problem #2 chronic renal failure: Stage IV. Etiology was thought to be secondary to diabetes/hypertension/recurrent AK I. Problem #3 hyperkalemia: This is from a combination of potassium supplement and worsening of renal failure. Type IV RTA as patient has history of diabetes cannot be ruled out Problem #4 anemia: A combination of iron deficiency anemia from GI bleeding and anemia of chronic disease. Presently her hemoglobin has declined Problem #5 history of CVA Problem #6 history of diabetes Problem #7 hypertension: Her blood pressure is reasonably controlled. Plan: 1] We'll change her IV fluid to D5 water with  50 mEq of sodium bicarbonate at 125 mL per hour 2] will start patient on Lasix 160 mg IV twice a day 3] we'll check her basic metabolic panel in the morning.  Rina Adney S 04/26/2015, 11:15 AM

## 2015-04-26 NOTE — Evaluation (Signed)
Occupational Therapy Evaluation Patient Details Name: [name] MRN: [id number] DOB: 1940/12/29 Today's Date: 04/26/2015    History of Present Illness Patient is a 74 y/o female admitted per request by her home doctor due to "lab abnormalities ". Of note patient states that her hemoglobin has apparently dropped significantly and her kidney function is worse. He is also complaining of generalized weakness over the last 2 days. The symptoms are constant and getting worse. Denies any melena or hematochezia, she continues to make urine on a regular basis. Oral intake is preserved. Patient has not tried anything for her symptoms. Per report patient's hemoglobin 2 days ago was 10.   Clinical Impression   PTA pt lived at home with daughter and granddaughter. Pt is awake, alert, and oriented x2 this am (person, place). Pt reports her daughter assists in all B/IADL tasks, pt daughter not available to confirm this. Pt required supervision for safety during LB dressing while sitting at EOB. Pt independent in bed mobility. Pt demonstrates good BUE range of motion (90% range) and strength (4/5). Pt appears to be at baseline with B/IADL tasks. No further acute OT services required.     Follow Up Recommendations  No OT follow up    Equipment Recommendations  None recommended by OT       Precautions / Restrictions Precautions Precautions: Fall Restrictions Weight Bearing Restrictions: No      Mobility Bed Mobility Overal bed mobility: Independent Bed Mobility: Supine to Sit;Sit to Supine     Supine to sit: Independent Sit to supine: Independent   General bed mobility comments: Pt required mod verbal cuing for lateral scooting          ADL Overall ADL's : Needs assistance/impaired;At baseline                     Lower Body Dressing: Supervision/safety                       Vision Vision Assessment?: Yes Eye Alignment: Within Functional Limits Ocular Range of  Motion: Within Functional Limits Alignment/Gaze Preference: Within Defined Limits Tracking/Visual Pursuits: Able to track stimulus in all quads without difficulty Saccades: Within functional limits Convergence: Within functional limits          Pertinent Vitals/Pain Pain Assessment: No/denies pain     Hand Dominance Right   Extremity/Trunk Assessment Upper Extremity Assessment Upper Extremity Assessment: Overall WFL for tasks assessed   Lower Extremity Assessment Lower Extremity Assessment: Defer to PT evaluation       Communication Communication Communication: No difficulties   Cognition Arousal/Alertness: Awake/alert Behavior During Therapy: WFL for tasks assessed/performed Overall Cognitive Status: No family/caregiver present to determine baseline cognitive functioning                                Home Living Family/patient expects to be discharged to:: Private residence Living Arrangements: Children Available Help at Discharge: Family               Bathroom Shower/Tub: Walk-in Psychologist, prison and probation services: Standard     Home Equipment: Environmental consultant - 2 wheels;Cane - single point;Bedside commode;Shower seat;Grab bars - tub/shower;Grab bars - toilet          Prior Functioning/Environment Level of Independence: Needs assistance  Gait / Transfers Assistance Needed: Pt reports she uses a two-wheeled walker for functional mobility at home ADL's / Homemaking Assistance Needed:  Pt reports her daughter assists with B/IADL tasks including bathing, dressing, meal preparation, and medication management         End of Session    Activity Tolerance: Patient tolerated treatment well Patient left: in bed;with call bell/phone within reach;with bed alarm set   Time: 0910-0930 OT Time Calculation (min): 20 min Charges:  OT General Charges $OT Visit: 1 Procedure OT Evaluation $Initial OT Evaluation Tier I: 1 Procedure  Guadelupe Sabin, OTR/L  [phone]   04/26/2015, 9:47 AM

## 2015-04-26 NOTE — Progress Notes (Signed)
[facility]                                                                              Patient Demographics  [name], is a 74 y.o. female, DOB - [birth year], JIR:[id number]  Admit date - 04/25/2015   Admitting Physician Waldemar Dickens, MD  Outpatient Primary MD for the patient is [name], MD  LOS - 1   Chief Complaint  Patient presents with  . Abnormal Lab       Brief HPI   Patient is a 74 year old female with diabetes, hypertension, prior history of stroke, CAD, seizure disorder, dementia, CK D stage IV, chronic anemia who presented after recommended by PCP due to lab abnormalities. Patient was noted to have an anemia and renal insufficiency she also complained of generalized weakness over the last 2 days, progressively getting worse. She denied any hematochezia or [place]. Creatinine was noted to be 4.93 at the time of admission, baseline around 3.5-3.7. Hemoglobin was noted to be 7.1, baseline around 8.0-9  Assessment & Plan    Principal Problem:   GI bleed, acute blood loss anemia on chronic anemia: Hemoglobin was 8.0 on 5/23 and 9.4 on 5/9, has been slowly trending down, FOBT positive, has chronic anemia due to stage IV CK D, prior history of GI bleeds. She is followed by hematology, on Aranesp injections.Upper endoscopy from 01/18/2014 without evidence of ulceration but showing H. pylori infection. Colonoscopy from 06/26/2014 showing benign tubular adenoma. - place on clear liquid diet, H&H, IV Protonix, continue to hold aspirin - GI consulted - Hemoglobin improved to 8.7 after 1 unit of packed RBC transfusion - DC subcutaneous heparin, placed on SCDs  Active Problems: Acute on chronic renal insufficiency, stage IV CKD: Likely worsened due to GI bleed, baseline creatinine 3.5-3.7 - Hold her torsemide today, on gentle hydration, will restart once creatinine closer to baseline - Received 1 unit packed RBCs - Creatinine improved to 4.59 this  morning, continue to monitor closely    Weakness generalized, dementia, protein calorie malnutrition - PT consult    Seizure - Continue Keppra, currently stable    History of CVA (cerebrovascular accident) -Hold aspirin due to #1     Type I diabetes mellitus, well controlled - Continue sliding scale insulin    Hyperkalemia - Placed on Kayexalate 1    Essential hypertension - Continue Toprol-XL, Imdur    Chronic combined systolic and diastolic CHF (congestive heart failure) - Currently compensated, will give gentle hydration for 1 L, then off to avoid fluid overload, holding torsemide until creatinine closer to baseline - Hold aspirin due to #1, continue beta blocker, Imdur - Not on an ACE inhibitor due to renal insufficiency - 2-D echo 7/15 had shown EF of 75-10%, grade 2 diastolic dysfunction  Code Status: full code  Family Communication: Discussed in detail with the patient, all imaging results, lab results explained to the patient   Disposition Plan: awaiting GI eval  Time Spent in minutes   25 minutes  Procedures  None   Consults   GI   DVT Prophylaxis SCD's  Medications  Scheduled Meds: . calcitRIOL  0.25  mcg Oral Daily  . donepezil  10 mg Oral QHS  . heparin  5,000 Units Subcutaneous 3 times per day  . hydrALAZINE  75 mg Oral 3 times per day  . insulin aspart  0-5 Units Subcutaneous QHS  . insulin aspart  0-9 Units Subcutaneous TID WC  . isosorbide mononitrate  15 mg Oral q morning - 10a  . levETIRAcetam  500 mg Oral BID  . metoprolol succinate  50 mg Oral Daily  . [START ON 04/29/2015] pantoprazole (PROTONIX) IV  40 mg Intravenous Q12H  . pravastatin  20 mg Oral QHS  . sodium chloride  3 mL Intravenous Q12H  . torsemide  60 mg Oral Daily   Continuous Infusions: . sodium chloride 1 mL (04/26/15 0755)  . pantoprozole (PROTONIX) infusion 8 mg/hr (04/26/15 0245)   PRN Meds:.acetaminophen **OR** acetaminophen, ondansetron **OR** ondansetron (ZOFRAN)  IV   Antibiotics   Anti-infectives    None        Subjective:   Sande Frisbee was seen and examined today.  Patient denies dizziness, chest pain, shortness of breath, abdominal pain, N/V/D/C, new weakness, numbess, tingling. No acute events overnight.  No active bleeding   Objective:   Blood pressure 110/54, pulse 66, temperature 97.8 F (36.6 C), temperature source Oral, resp. rate 20, height 5\' 4"  (1.626 m), weight 52.6 kg (115 lb 15.4 oz), SpO2 100 %.  Wt Readings from Last 3 Encounters:  04/25/15 52.6 kg (115 lb 15.4 oz)  04/19/15 54.432 kg (120 lb)  04/16/15 52.98 kg (116 lb 12.8 oz)     Intake/Output Summary (Last 24 hours) at 04/26/15 0907 Last data filed at 04/26/15 0700  Gross per 24 hour  Intake 3677.92 ml  Output      0 ml  Net 3677.92 ml    Exam  General: Alert and oriented x 2, NAD, frail appearing   HEENT:  PERRLA, EOMI, Anicteic Sclera, mucous membranes moist.   Neck: Supple, no JVD, no masses  CVS: S1 S2 auscultated, 2/6 systolic murmur. Regular rate and rhythm.  Respiratory: Clear to auscultation bilaterally, no wheezing, rales or rhonchi  Abdomen: Soft, nontender, nondistended, + bowel sounds  Ext: no cyanosis clubbing or edema  Neuro: AAOx2, Cr N's II- XII. Strength 5/5 upper and lower extremities bilaterally  Skin: No rashes  Psych: Normal affect and demeanor, alert and oriented x2   Data Review   Micro Results No results found for this or any previous visit (from the past 240 hour(s)).  Radiology Reports Dg Chest Portable 1 View  04/25/2015   CLINICAL DATA:  Low hemoglobin.  Confusion.  Cardiomyopathy.  EXAM: PORTABLE CHEST - 1 VIEW  COMPARISON:  01/02/2015  FINDINGS: Moderate enlargement of the cardiopericardial silhouette, cardiothoracic index 66% on the AP view.  Prior CABG.  Atherosclerotic calcification of the aortic arch.  Upper zone pulmonary vascular prominence but without findings of interstitial or airspace edema.   IMPRESSION: 1. Moderate enlargement of the cardiopericardial silhouette with pulmonary venous hypertension but without overt edema. 2. Atherosclerotic arch. 3. Prior CABG.   Electronically Signed   By: Van Clines M.D.   On: 04/25/2015 16:29    CBC  Recent Labs Lab 04/25/15 1643 04/26/15 0735  WBC 5.7 5.7  HGB 7.1* 8.7*  HCT 23.5* 27.9*  PLT 230 203  MCV 91.8 90.6  MCH 27.7 28.2  MCHC 30.2 31.2  RDW 17.1* 16.2*  LYMPHSABS 1.6  --   MONOABS 0.5  --   EOSABS 0.1  --  BASOSABS 0.0  --     Chemistries   Recent Labs Lab 04/25/15 1643 04/26/15 0735  NA 140 139  K 5.6* 5.8*  CL 115* 114*  CO2 16* 16*  GLUCOSE 127* 126*  BUN 134* 124*  CREATININE 4.93* 4.59*  CALCIUM 8.7* 8.3*  AST 22 25  ALT 14 14  ALKPHOS 91 106  BILITOT 0.5 0.6   ------------------------------------------------------------------------------------------------------------------ estimated creatinine clearance is 8.9 mL/min (by C-G formula based on Cr of 4.59). ------------------------------------------------------------------------------------------------------------------ No results for input(s): HGBA1C in the last 72 hours. ------------------------------------------------------------------------------------------------------------------ No results for input(s): CHOL, HDL, LDLCALC, TRIG, CHOLHDL, LDLDIRECT in the last 72 hours. ------------------------------------------------------------------------------------------------------------------ No results for input(s): TSH, T4TOTAL, T3FREE, THYROIDAB in the last 72 hours.  Invalid input(s): FREET3 ------------------------------------------------------------------------------------------------------------------ No results for input(s): VITAMINB12, FOLATE, FERRITIN, TIBC, IRON, RETICCTPCT in the last 72 hours.  Coagulation profile  Recent Labs Lab 04/25/15 1643  INR 1.30    No results for input(s): DDIMER in the last 72 hours.  Cardiac  Enzymes No results for input(s): CKMB, TROPONINI, MYOGLOBIN in the last 168 hours.  Invalid input(s): CK ------------------------------------------------------------------------------------------------------------------ Invalid input(s): POCBNP   Recent Labs  04/25/15 1928 04/26/15 0755  GLUCAP 119* 124*     Zylen Wenig M.D. [facility] 04/26/2015, 9:07 AM  Pager: [phone]   Between 7am to 7pm - call Pager - [phone]  After 7pm go to [email or web address] - password [email or web address]  Call night coverage person covering after 7pm

## 2015-04-26 NOTE — Discharge Instructions (Signed)
Acute Kidney Injury Acute kidney injury is a disease in which there is sudden (acute) damage to the kidneys. The kidneys are 2 organs that lie on either side of the spine between the middle of the back and the front of the abdomen. The kidneys:  Remove wastes and extra water from the blood.   Produce important hormones. These help keep bones strong, regulate blood pressure, and help create red blood cells.   Balance the fluids and chemicals in the blood and tissues. A small amount of kidney damage may not cause problems, but a large amount of damage may make it difficult or impossible for the kidneys to work the way they should. Acute kidney injury may develop into long-lasting (chronic) kidney disease. It may also develop into a life-threatening disease called end-stage kidney disease. Acute kidney injury can get worse very quickly, so it should be treated right away. Early treatment may prevent other kidney diseases from developing.  CAUSES   A problem with blood flow to the kidneys. This may be caused by:   Blood loss.   Heart disease.   Severe burns.   Liver disease.  Direct damage to the kidneys. This may be caused by:  Some medicines.   A kidney infection.   Poisoning or consuming toxic substances.   A surgical wound.   A blow to the kidney area.   A problem with urine flow. This may be caused by:   Cancer.   Kidney stones.   An enlarged prostate. SYMPTOMS   Swelling (edema) of the legs, ankles, or feet.   Tiredness (lethargy).   Nausea or vomiting.   Confusion.   Problems with urination, such as:   Painful or burning feeling during urination.   Decreased urine production.   Frequent accidents in children who are potty trained.   Bloody urine.   Muscle twitches and cramps.   Shortness of breath.   Seizures.   Chest pain or pressure. Sometimes, no symptoms are present. DIAGNOSIS Acute kidney injury may be detected  and diagnosed by tests, including blood, urine, imaging, or kidney biopsy tests.  TREATMENT Treatment of acute kidney injury varies depending on the cause and severity of the kidney damage. In mild cases, no treatment may be needed. The kidneys may heal on their own. If acute kidney injury is more severe, your caregiver will treat the cause of the kidney damage, help the kidneys heal, and prevent complications from occurring. Severe cases may require a procedure to remove toxic wastes from the body (dialysis) or surgery to repair kidney damage. Surgery may involve:   Repair of a torn kidney.   Removal of an obstruction. Most of the time, you will need to stay overnight at the hospital.  HOME CARE INSTRUCTIONS:  Follow your prescribed diet.  Only take over-the-counter or prescription medicines as directed by your caregiver.  Do not take any new medicines (prescription, over-the-counter, or nutritional supplements) unless approved by your caregiver. Many medicines can worsen your kidney damage or need to have the dose adjusted.   Keep all follow-up appointments as directed by your caregiver.  Observe your condition to make sure you are healing as expected. SEEK IMMEDIATE MEDICAL CARE IF:  You are feeling ill or have severe pain in the back or side.   Your symptoms return or you have new symptoms.  You have any symptoms of end-stage kidney disease. These include:   Persistent itchiness.   Loss of appetite.   Headaches.   Abnormally dark  or light skin.  Numbness in the hands or feet.   Easy bruising.   Frequent hiccups.   Menstruation stops.   You have a fever.  You have increased urine production.  You have pain or bleeding when urinating.

## 2015-04-26 NOTE — Evaluation (Addendum)
Physical Therapy Evaluation Patient Details Name: ANWITA MENCER MRN: [id number] DOB: [birth year] Today's Date: 04/26/2015   History of Present Illness  Patient is a 74 y/o female admitted per request by her home doctor due to "lab abnormalities ". Of note patient states that her hemoglobin has apparently dropped significantly and her kidney function is worse. She is also complaining of generalized weakness over the last 2 days. The symptoms are constant and getting worse. Denies any melena or hematochezia, she continues to make urine on a regular basis. Oral intake is preserved. Patient has not tried anything for her symptoms. Per report patient's hemoglobin 2 days ago was 10.  Clinical Impression   Pt is seen for evaluation today, admitted for a GI bleed.  Pt was found to be very close to prior functional level and should not need any further PT.    Follow Up Recommendations  none    Equipment Recommendations  None recommended by PT    Recommendations for Other Services   none    Precautions / Restrictions Precautions Precautions: Fall Restrictions Weight Bearing Restrictions: No      Mobility  Bed Mobility Overal bed mobility: Independent Bed Mobility: Supine to Sit;Sit to Supine     Supine to sit: Independent Sit to supine: Independent   General bed mobility comments: Pt required mod verbal cuing for lateral scooting  Transfers Overall transfer level: Modified independent Equipment used: Rolling walker (2 wheeled)                Ambulation/Gait Ambulation/Gait assistance: Supervision Ambulation Distance (Feet): 150 Feet Assistive device: Rolling walker (2 wheeled) Gait Pattern/deviations: WFL(Within Functional Limits) Gait velocity: neededc cues to slow down her pace a bit for safety Gait velocity interpretation: at or above normal speed for age/gender                          Balance Overall balance assessment: No apparent balance deficits  (not formally assessed)                                           Pertinent Vitals/Pain Pain Assessment: No/denies pain    Home Living Family/patient expects to be discharged to:: Private residence Living Arrangements: Children Available Help at Discharge: Family;Available 24 hours/day           Home Equipment: Walker - 2 wheels;Cane - single point;Bedside commode;Shower seat;Grab bars - tub/shower;Grab bars - toilet      Prior Function Level of Independence: Needs assistance   Gait / Transfers Assistance Needed: pt states that she ambulates independently with a walker  ADL's / Homemaking Assistance Needed: Pt reports her daughter assists with B/IADL tasks including bathing, dressing, meal preparation, and medication management        Hand Dominance   Dominant Hand: Right    Extremity/Trunk Assessment   Upper Extremity Assessment: Defer to OT evaluation           Lower Extremity Assessment: Overall WFL for tasks assessed         Communication   Communication: No difficulties  Cognition Arousal/Alertness: Awake/alert Behavior During Therapy: WFL for tasks assessed/performed Overall Cognitive Status: Within Functional Limits for tasks assessed  Assessment/Plan    PT Assessment Patent does not need any further PT services  PT Diagnosis     PT Problem List    PT Treatment Interventions     PT Goals (Current goals can be found in the Care Plan section) Acute Rehab PT Goals PT Goal Formulation: All assessment and education complete, DC therapy                                End of Session Equipment Utilized During Treatment: Gait belt Activity Tolerance: Patient tolerated treatment well Patient left: in chair;with call bell/phone within reach;with chair alarm set Nurse Communication: Mobility status         Time: 1165-7903 PT Time Calculation (min) (ACUTE ONLY): 26  min   Charges:   PT Evaluation $Initial PT Evaluation Tier I: 1 Procedure     PT G CodesOwens Shark, Joesiah Lonon L  PT 04/26/2015, 12:54 PM

## 2015-04-26 NOTE — Progress Notes (Signed)
Initial Nutrition Assessment  DOCUMENTATION CODES:  Severe malnutrition in context of chronic illness  INTERVENTION:  Resource Breeze po BID, each supplement provides 250 kcal and 9 grams of protein   Recommend limit high potassium foods  Lower-protein intake recommended secondary to CKD Stage IV  Follow diet advancement   NUTRITION DIAGNOSIS:  Impaired nutrient utilization related to chronic illness as evidenced by estimated needs, moderate depletion of body fat, moderate depletions of muscle mass.   GOAL:  Patient will meet greater than or equal to 90% of their needs   MONITOR:  PO intake, Supplement acceptance, Labs, Weight trends, I & O's  REASON FOR ASSESSMENT:  Malnutrition Screening Tool, Consult Assessment of nutrition requirement/status  ASSESSMENT: Pt has hx of dementia, DM type 2, CHF,CKD stage 4, anemia and recurrent GIB.  Pt has experienced severe weight loss over the past 6 months.  (16% in 30 days and 23% decrease in 2 months). Additional wt loss expected with current diuretic therapy.  Expect that pt wt loss multi-factored decreased po intake in the setting of chronic diseases (dementia, CHF, CKD) as well as fluid status changes. Diet:she consumed 100% of clear liquids at lunch today and is able to feed herself?  Height:  Ht Readings from Last 1 Encounters:  04/25/15 5\' 4"  (1.626 m)    Weight:  Wt Readings from Last 1 Encounters:  04/25/15 115 lb 15.4 oz (52.6 kg)    Ideal Body Weight:  54.5 kg  Wt Readings from Last 10 Encounters:  04/25/15 115 lb 15.4 oz (52.6 kg)  04/19/15 120 lb (54.432 kg)  04/16/15 116 lb 12.8 oz (52.98 kg)  03/22/15 143 lb (64.864 kg)  03/09/15 148 lb (67.132 kg)  02/23/15 150 lb (68.04 kg)  01/22/15 136 lb 6.4 oz (61.871 kg)  01/09/15 167 lb 12.3 oz (76.1 kg)  12/31/14 169 lb 12.1 oz (77 kg)  10/25/14 173 lb 12.8 oz (78.835 kg)    BMI:  Body mass index is 19.9 kg/(m^2).  Estimated Nutritional  Needs:  Kcal:  1590-1855  Protein:  37-42 gr  Fluid:  1600 ml daily  Skin:   WDL  Diet Order:  Diet clear liquid Room service appropriate?: Yes; Fluid consistency:: Thin  EDUCATION NEEDS:  No education needs identified at this time   Intake/Output Summary (Last 24 hours) at 04/26/15 1625 Last data filed at 04/26/15 1500  Gross per 24 hour  Intake 3917.92 ml  Output      0 ml  Net 3917.92 ml    Last BM:  unknown  Colman Cater MS,RD,CSG,LDN Office: [phone] Pager: [phone]

## 2015-04-26 NOTE — Care Management Note (Signed)
Case Management Note  Patient Details  Name: [name] MRN: [id number] Date of Birth: [birth year]  Expected Discharge Date:  04/27/15               Expected Discharge Plan:  [place]  In-House Referral:  NA  Discharge planning Services  CM Consult  Post Acute Care Choice:  Resumption of Svcs/PTA Provider Choice offered to:     DME Arranged:    DME Agency:     HH Arranged:    HH Agency:     Status of Service:  In process, will continue to follow  Medicare Important Message Given:    Date Medicare IM Given:    Medicare IM give by:    Date Additional Medicare IM Given:    Additional Medicare Important Message give by:     If discussed at [place] of Stay Meetings, dates discussed:    Additional Comments: Pt is from home, lives with daughter [name]. Patient is dependent at baseline. Pt has CAP aid. Pt is active with [facility] for [facility] RN services, pt [name] in the past - will identify if pt is currently [place]. [facility] aware of admission and will need order to resume [place] services at discharge. Pt has home O2 and wheelchair. Pt plans to return home at discharge with [facility] services. Will cont to follow for CM needs.    Sherald Barge, RN 04/26/2015, 3:17 PM

## 2015-04-26 NOTE — Consult Note (Signed)
Referring Provider: Mendel Corning, MD Primary Care Physician:  [name], MD Primary Gastroenterologist:  Barney Drain, MD  Reason for Consultation:  GI bleed  HPI: [name] is a 74 y.o. female presented to ER due to drop in Hgb and worsening kidney function. Patient has a history of recurrent profound anemia and GI bleeding. Back in February 2015 had melena, EGD showed abnormal gastric and duodenal bulbar mucosa (H. pylori) now status post treatment, colonoscopy in August 2015 by Dr. Laural Golden (covering provider) with 8 mm polyp (tubular adenoma), pandiverticulosis. Underwent an EGD and capsule placement in August 2015 during hospitalization: Tiny gastric polyps not manipulated, minimal pigmentation of the duodenal bulbar likely insignificant. Unfortunately capsule study was not complete, never reached the cecum. Only few flecks of brb seen on initial images. Referred to [facility] last fall for obscure GI bleeding and transfusion-dependent anemia. Also followed by hematology, receiving Aranesp and IV iron as needed. Underwent EGD/enteroscopy at [facility] in 01/2015 with monitored anesthesia care. Reviewed report. She had a single AVM in the fundus status post APC. 4 feet of small bowel evaluated into the jejunum. 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 One small lymphangiectasia with polypoid appearance also treated lightly with APC. At the completion of the procedure, patient would not spontaneously breathe, rapid response/brief code was called. She had PEA arrest. She was in A. fib with a rapid response, no shockable rhythm. She received 3 minutes of chest compressions and bag ventilation and thereafter had a palpable weak pulse.  Per discharge summary, patient's family was advised that she should not undergo any unnecessary procedures due to increased risk of recurrent cardiac events.  In the emergency department she was noted to have a hemoglobin of 7.1. 10 days ago it was 8, 3 weeks  ago 9.4. Her BUN was 134 up from 72 three months ago, creatinine 4.93 up from 3.63 three months ago. She received 1 unit of packed red blood cells and her hemoglobin is up to 8.7 today. BUN and creatinine slightly improved.   Patient has dementia at baseline. Family not present at this time. Tried to call patient's listed emergency contact, daughter [name] (no voice mail and no answer). According to the record she had had generalized weakness over the last couple of days. No reported melena or hematochezia. No issues with oral intake. No reported abdominal pain. Patient gives negative review of systems today when asked. Patient was heme positive in the ER.   Prior to Admission medications   Medication Sig Start Date End Date Taking? Authorizing Provider  aspirin EC 81 MG tablet Take 81 mg by mouth daily.   Yes Historical Provider, MD  calcitRIOL (ROCALTROL) 0.25 MCG capsule Take 1 capsule (0.25 mcg total) by mouth daily. 05/02/14  Yes Eugenie Filler, MD  donepezil (ARICEPT) 10 MG tablet Take 10 mg by mouth at bedtime.  01/30/14  Yes Historical Provider, MD  folic acid (FOLVITE) 1 MG tablet Take 1 mg by mouth every morning.    Yes Historical Provider, MD  hydrALAZINE (APRESOLINE) 25 MG tablet Take 75 mg by mouth every 8 (eight) hours.    Yes Historical Provider, MD  insulin detemir (LEVEMIR) 100 UNIT/ML injection Inject 0.08 mLs (8 Units total) into the skin at bedtime. 05/02/14  Yes Eugenie Filler, MD  isosorbide mononitrate (IMDUR) 30 MG 24 hr tablet Take 0.5 tablets (15 mg total) by mouth every morning. 01/09/15  Yes Radene Gunning, NP  levETIRAcetam ([place])  100 MG/ML solution Take 500 mg by mouth 2 (two) times daily.  09/29/14  Yes Historical Provider, MD  linagliptin (TRADJENTA) 5 MG TABS tablet Take 5 mg by mouth every morning.    Yes Historical Provider, MD  Maltodextrin-Xanthan Gum (RESOURCE THICKENUP CLEAR) POWD Take 120 g by mouth as needed. Patient taking differently: Take 1 Can by  mouth daily.  01/09/15  Yes Lezlie Octave Black, NP  metoprolol succinate (TOPROL-XL) 100 MG 24 hr tablet Take 50 mg by mouth daily. Take with or immediately following a meal.   Yes Historical Provider, MD  mupirocin ointment (BACTROBAN) 2 % Apply 1 application topically 2 (two) times daily.  01/03/15  Yes Historical Provider, MD  pantoprazole (PROTONIX) 40 MG tablet Take 40 mg by mouth every morning.   Yes Historical Provider, MD  pravastatin (PRAVACHOL) 20 MG tablet Take 20 mg by mouth at bedtime.    Yes Historical Provider, MD  potassium chloride (K-DUR) 10 MEQ tablet Take 1 tablet (10 mEq total) by mouth daily. 02/23/15   Lendon Colonel, NP  torsemide (DEMADEX) 20 MG tablet Take 3 tablets (60 mg total) by mouth daily. 03/09/15   Lendon Colonel, NP    Current Facility-Administered Medications  Medication Dose Route Frequency Provider Last Rate Last Dose  . 0.9 %  sodium chloride infusion   Intravenous Continuous Waldemar Dickens, MD 75 mL/hr at 04/26/15 0755 1 mL at 04/26/15 0755  . acetaminophen (TYLENOL) tablet 650 mg  650 mg Oral Q6H PRN Waldemar Dickens, MD       Or  . acetaminophen (TYLENOL) suppository 650 mg  650 mg Rectal Q6H PRN Waldemar Dickens, MD      . calcitRIOL (ROCALTROL) capsule 0.25 mcg  0.25 mcg Oral Daily Waldemar Dickens, MD   0.25 mcg at 04/26/15 0853  . donepezil (ARICEPT) tablet 10 mg  10 mg Oral QHS Waldemar Dickens, MD   10 mg at 04/26/15 0057  . heparin injection 5,000 Units  5,000 Units Subcutaneous 3 times per day Waldemar Dickens, MD   5,000 Units at 04/26/15 0106  . hydrALAZINE (APRESOLINE) tablet 75 mg  75 mg Oral 3 times per day Waldemar Dickens, MD   75 mg at 04/26/15 0058  . insulin aspart (novoLOG) injection 0-5 Units  0-5 Units Subcutaneous QHS Waldemar Dickens, MD   0 Units at 04/25/15 2200  . insulin aspart (novoLOG) injection 0-9 Units  0-9 Units Subcutaneous TID WC Waldemar Dickens, MD   1 Units at 04/26/15 [id number]  . isosorbide mononitrate (IMDUR) 24 hr tablet 15 mg   15 mg Oral q morning - 10a Waldemar Dickens, MD   15 mg at 04/26/15 0853  . levETIRAcetam (KEPPRA) 100 MG/ML solution 500 mg  500 mg Oral BID Waldemar Dickens, MD   500 mg at 04/26/15 0243  . metoprolol succinate (TOPROL-XL) 24 hr tablet 50 mg  50 mg Oral Daily Waldemar Dickens, MD   50 mg at 04/26/15 [phone]  . ondansetron (ZOFRAN) tablet 4 mg  4 mg Oral Q6H PRN Waldemar Dickens, MD       Or  . ondansetron [facility]) injection 4 mg  4 mg Intravenous Q6H PRN Waldemar Dickens, MD      . pantoprazole (PROTONIX) 80 mg in sodium chloride 0.9 % 250 mL (0.32 mg/mL) infusion  8 mg/hr Intravenous Continuous Waldemar Dickens, MD 25 mL/hr at 04/26/15 0245 8 mg/hr at 04/26/15 0245  . [  START ON 04/29/2015] pantoprazole (PROTONIX) injection 40 mg  40 mg Intravenous Q12H Waldemar Dickens, MD      . pravastatin (PRAVACHOL) tablet 20 mg  20 mg Oral QHS Waldemar Dickens, MD   20 mg at 04/26/15 0105  . sodium chloride 0.9 % injection 3 mL  3 mL Intravenous Q12H Waldemar Dickens, MD   3 mL at 04/25/15 2200  . torsemide (DEMADEX) tablet 60 mg  60 mg Oral Daily Waldemar Dickens, MD   60 mg at 04/26/15 0851    Allergies as of 04/25/2015  . (No Known Allergies)    Past Medical History  Diagnosis Date  . Type 2 diabetes mellitus   . Essential hypertension, benign   . History of stroke     Previously on Coumadin  . MI, old     Reported 67  . Seizure disorder   . Coronary atherosclerosis of native coronary artery     Multivessel status post CABG 2002  . History of GI bleed     Erosive gastritis and duodenitis by EGD 2002  . Mixed hyperlipidemia   . Ischemic cardiomyopathy     LVEF 40-45% March 2013  . Dementia   . CKD (chronic kidney disease) stage 4, GFR 15-29 ml/min   . CHF (congestive heart failure)   . Chronic respiratory failure with hypoxia   . Stroke   . Seizures   . Anemia of chronic renal failure, stage 4 (severe) 01/02/2015    Past Surgical History  Procedure Laterality Date  . Coronary artery bypass  graft  July 2002    LIMA to LAD, SVG to OM1, SVG to OM 2, SVG to RCA  . Tee without cardioversion  01/28/2012    Procedure: TRANSESOPHAGEAL ECHOCARDIOGRAM (TEE);  Surgeon: Lelon Perla, MD;  Location: [facility] ENDOSCOPY;  Service: Cardiovascular;  Laterality: [name];  . Colonoscopy  2002  . Esophagogastroduodenoscopy  2002  . Esophagogastroduodenoscopy [name] 01/18/2014    GBT:DVVOH hiatal hernia. Abnormal gastric and duodenal bulbar mucosa of uncertain significance - status post gastric bx (chronic gastritis/H.pylori +  . Colonoscopy [name] 06/25/2014    Dr. Laural Golden: tubular adenoma, pandiverticulosis.   . Agile capsule [name] 07/06/2014    Procedure: AGILE CAPSULE;  Surgeon: Danie Binder, MD;  Location: [facility] ENDO SUITE;  Service: Endoscopy;  Laterality: [name];  730  . Esophagogastroduodenoscopy [name] 07/20/2014    Dr. Gala Romney: tiny gastric polyps not manipulated. minimal pigmentation of duodenal bulb likely not significant  . Givens capsule study [name] 07/20/2014    incomplete    Family History  Problem Relation Age of Onset  . Stroke Father   . Diabetes type II Mother   . Colon cancer Neg Hx     History   Social History  . Marital Status: Single    Spouse Name: [name]  . Number of Children: [name]  . Years of Education: [name]   Occupational History  . Not on file.   Social History Main Topics  . Smoking status: Former Smoker -- 1.00 packs/day for 50 years    Types: Cigarettes    Start date: 02/22/1961    Quit date: 02/23/2012  . Smokeless tobacco: Never Used  . Alcohol Use: No  . Drug Use: No  . Sexual Activity: No   Other Topics Concern  . Not on file   Social History Narrative     YWV:PXTGGYI give negative ROS but history unreliable  General: Negative for anorexia, weight loss, fever, chills, fatigue, weakness.  Eyes: Negative for vision changes.  ENT: Negative for hoarseness, difficulty swallowing , nasal congestion. CV: Negative for chest pain, angina, palpitations, dyspnea on exertion,  peripheral edema.  Respiratory: Negative for dyspnea at rest, dyspnea on exertion, cough, sputum, wheezing.  GI: See history of present illness. GU:  Negative for dysuria, hematuria, urinary incontinence, urinary frequency, nocturnal urination.  MS: Negative for joint pain, low back pain.  Derm: Negative for rash or itching.  Neuro: Negative for weakness, abnormal sensation, seizure, frequent headaches, memory loss, confusion.  Psych: Negative for anxiety, depression, suicidal ideation, hallucinations.  Endo: Negative for unusual weight change.  Heme: Negative for bruising or bleeding. Allergy: Negative for rash or hives.       Physical Examination: Vital signs in last 24 hours: Temp:  [97.4 F (36.3 C)-98.6 F (37 C)] 97.8 F (36.6 C) (06/02 0700) Pulse Rate:  [64-73] 66 (06/02 0700) Resp:  [18-20] 20 (06/02 0700) BP: (102-137)/(37-78) 110/54 mmHg (06/02 0851) SpO2:  [98 %-100 %] 100 % (06/02 0700) Weight:  [112 lb (50.803 kg)-115 lb 15.4 oz (52.6 kg)] 115 lb 15.4 oz (52.6 kg) (06/01 2024)    General: Well-nourished, well-developed in no acute distress.  Head: Normocephalic, atraumatic.   Eyes: Conjunctiva pink, no icterus. Mouth: Oropharyngeal mucosa moist and pink , no lesions erythema or exudate. Neck: Supple without thyromegaly, masses, or lymphadenopathy.  Lungs: Clear to auscultation bilaterally.  Heart: Regular rate and rhythm, no murmurs rubs or gallops.  Abdomen: Bowel sounds are normal, nontender, nondistended, no hepatosplenomegaly or masses, no abdominal bruits or    hernia , no rebound or guarding.   Rectal: not performed Extremities: No lower extremity edema, clubbing, deformity.  Neuro: Alert and oriented x 4 , grossly normal neurologically.  Skin: Warm and dry, no rash or jaundice.   Psych: Alert and cooperative, normal mood and affect.        Intake/Output from previous day: 06/01 0701 - 06/02 0700 In: 3677.9 [P.O.:240; I.V.:3047.9; Blood:390] Out: -   Intake/Output this shift:    Lab Results: CBC  Recent Labs  04/25/15 1643 04/26/15 0735  WBC 5.7 5.7  HGB 7.1* 8.7*  HCT 23.5* 27.9*  MCV 91.8 90.6  PLT 230 203   BMET  Recent Labs  04/25/15 1643 04/26/15 0735  NA 140 139  K 5.6* 5.8*  CL 115* 114*  CO2 16* 16*  GLUCOSE 127* 126*  BUN 134* 124*  CREATININE 4.93* 4.59*  CALCIUM 8.7* 8.3*   LFT  Recent Labs  04/25/15 1643 04/26/15 0735  BILITOT 0.5 0.6  ALKPHOS 91 106  AST 22 25  ALT 14 14  PROT 8.9* 8.8*  ALBUMIN 3.5 3.4*    Lipase No results for input(s): LIPASE in the last 72 hours.  PT/INR  Recent Labs  04/25/15 1643  LABPROT 16.3*  INR 1.30      Imaging Studies: Dg Chest Portable 1 View  04/25/2015   CLINICAL DATA:  Low hemoglobin.  Confusion.  Cardiomyopathy.  EXAM: PORTABLE CHEST - 1 VIEW  COMPARISON:  01/02/2015  FINDINGS: Moderate enlargement of the cardiopericardial silhouette, cardiothoracic index 66% on the AP view.  Prior CABG.  Atherosclerotic calcification of the aortic arch.  Upper zone pulmonary vascular prominence but without findings of interstitial or airspace edema.  IMPRESSION: 1. Moderate enlargement of the cardiopericardial silhouette with pulmonary venous hypertension but without overt edema. 2. Atherosclerotic arch. 3. Prior CABG.   Electronically Signed   By: Van Clines M.D.   On: 04/25/2015 16:29  [  4 week]   Impression: 74 year old lady with acute on chronic anemia. History of a obscure GI bleeding felt to be related to AVMs, status post enteroscopy with ablation as outlined above back in March 2016. Incomplete capsule endoscopy last year. Last colonoscopy August 2015 as outlined. Patient without any overt GI bleeding i.e. melena or hematochezia. she is Hemoccult-positive. Hemoglobin up appropriately status post transfusion. Followed closely by hematology receiving IV iron and Aranesp.  Unfortunately given her history of PEA arrest status post recent endoscopy, we  would not recommend any unnecessary endoscopies at this time. Would recommend continued IV iron and Aranesp, transfusion as needed. Consider follow-up with Dr. Arsenio Loader at [facility] for any further recommendations regarding obscure GI bleeding.  Plan: 1. No endoscopic evaluations planned here locally. 2. Continue supportive measures and way of transfusions as needed, IV iron and Aranesp., Close hematologic follow-up. 3. Consider follow-up with Dr. Arsenio Loader at [facility] for any further recommendations regarding obscure GI bleeding.  We would like to thank you for the opportunity to participate in the care of [name].  Laureen Ochs. Bernarda Caffey [facility] [phone] 6/2/20169:53 AM     LOS: 1 day     Addendum: Talked with Abelardo Diesel. Bloodwork from Monday was worse. Potassium high, Creatinine worse, Hemoglobin worse. Daughter states there was one "black" stool ?Monday but rest were brown. Last blood transfusion about 4-5 months ago. Started on ASA 81mg  daily after 01/2015 hospitalization but no other NSAIDs. She confirmed that she was told that patient would not likely tolerate any further anesthesia events.   Laureen Ochs. Bernarda Caffey [facility] [phone] 6/2/201610:03 AM  Attending note:  Patient seen and examined. Agree with above assessment and recommendations. Agree with supporting hemoglobin with conservative measures. If she were to develop overt GI bleeding, I would recommend transfer over to [facility].

## 2015-04-26 NOTE — Progress Notes (Signed)
Patient pulled out IV on arrival to floor from ED.  Patient moved to 314 close to nurse's station, sitter floating.  Attempted to start IV, blood ordered but unable to start.  Lawernce Keas, RN attempted to start without success.  Supervisor called, will come ASAP.

## 2015-04-27 DIAGNOSIS — K922 Gastrointestinal hemorrhage, unspecified: Secondary | ICD-10-CM

## 2015-04-27 LAB — CBC
HCT: 27.2 % — ABNORMAL LOW (ref 36.0–46.0)
Hemoglobin: 8.6 g/dL — ABNORMAL LOW (ref 12.0–15.0)
MCH: 27.8 pg (ref 26.0–34.0)
MCHC: 31.6 g/dL (ref 30.0–36.0)
MCV: 88 fL (ref 78.0–100.0)
Platelets: 193 10*3/uL (ref 150–400)
RBC: 3.09 MIL/uL — ABNORMAL LOW (ref 3.87–5.11)
RDW: 16.4 % — ABNORMAL HIGH (ref 11.5–15.5)
WBC: 4.7 10*3/uL (ref 4.0–10.5)

## 2015-04-27 LAB — BASIC METABOLIC PANEL
Anion gap: 12 (ref 5–15)
BUN: 102 mg/dL — AB (ref 6–20)
CHLORIDE: 108 mmol/L (ref 101–111)
CO2: 18 mmol/L — AB (ref 22–32)
Calcium: 7.5 mg/dL — ABNORMAL LOW (ref 8.9–10.3)
Creatinine, Ser: 3.79 mg/dL — ABNORMAL HIGH (ref 0.44–1.00)
GFR calc Af Amer: 13 mL/min — ABNORMAL LOW (ref >60)
GFR calc non Af Amer: 11 mL/min — ABNORMAL LOW (ref >60)
Glucose, Bld: 82 mg/dL (ref 65–99)
Potassium: 4 mmol/L (ref 3.5–5.1)
Sodium: 138 mmol/L (ref 135–145)

## 2015-04-27 LAB — HEMOGLOBIN A1C
Hgb A1c MFr Bld: 5.9 % — ABNORMAL HIGH (ref 4.8–5.6)
Mean Plasma Glucose: 123 mg/dL

## 2015-04-27 LAB — GLUCOSE, CAPILLARY
GLUCOSE-CAPILLARY: 93 mg/dL (ref 65–99)
GLUCOSE-CAPILLARY: 94 mg/dL (ref 65–99)
Glucose-Capillary: 79 mg/dL (ref 65–99)
Glucose-Capillary: 128 mg/dL — ABNORMAL HIGH (ref 65–99)

## 2015-04-27 MED ORDER — LORAZEPAM 2 MG/ML IJ SOLN
0.5000 mg | Freq: Once | INTRAMUSCULAR | Status: AC
  Administered 2015-04-27: 0.5 mg via INTRAVENOUS
  Filled 2015-04-27: qty 1

## 2015-04-27 MED ORDER — SODIUM POLYSTYRENE SULFONATE 15 GM/60ML PO SUSP: 30.0000 g | ORAL | Status: DC

## 2015-04-27 NOTE — Progress Notes (Signed)
Patient becoming agitated and trying to climb out of bed, paged on-call MD, will follow any new orders given and continue to monitor patient.

## 2015-04-27 NOTE — Progress Notes (Signed)
Subjective: Interval History: none.  Objective: Vital signs in last 24 hours: Temp:  [97.4 F (36.3 C)-97.7 F (36.5 C)] 97.7 F (36.5 C) (06/03 0532) Pulse Rate:  [65-136] 75 (06/03 0532) Resp:  [20] 20 (06/03 0532) BP: (97-138)/(46-55) 138/46 mmHg (06/03 0532) SpO2:  [95 %-100 %] 95 % (06/03 0532) Weight:  [53.978 kg (119 lb)] 53.978 kg (119 lb) (06/03 0532) Weight change: 3.175 kg (7 lb)  Intake/Output from previous day: 06/02 0701 - 06/03 0700 In: 2281 [P.O.:840; I.V.:1375; IV Piggyback:66] Out: 300 [Urine:300] Intake/Output this shift:    Patient is alert but noncooperative today. Patient continuously pushing my hand and stethoscope so that making evaluation very difficult. At this moment she doesn't seem to be in any apparent distress. Chest is clear Extremities no edema  Lab Results:  Recent Labs  04/25/15 1643 04/26/15 0735  WBC 5.7 5.7  HGB 7.1* 8.7*  HCT 23.5* 27.9*  PLT 230 203   BMET:  Recent Labs  04/25/15 1643 04/26/15 0735  NA 140 139  K 5.6* 5.8*  CL 115* 114*  CO2 16* 16*  GLUCOSE 127* 126*  BUN 134* 124*  CREATININE 4.93* 4.59*  CALCIUM 8.7* 8.3*   No results for input([name]): PTH in the last 72 hours. Iron Studies: No results for input([name]): IRON, TIBC, TRANSFERRIN, FERRITIN in the last 72 hours.  Studies/Results: Dg Chest Portable 1 View  04/25/2015   CLINICAL DATA:  Low hemoglobin.  Confusion.  Cardiomyopathy.  EXAM: PORTABLE CHEST - 1 VIEW  COMPARISON:  01/02/2015  FINDINGS: Moderate enlargement of the cardiopericardial silhouette, cardiothoracic index 66% on the AP view.  Prior CABG.  Atherosclerotic calcification of the aortic arch.  Upper zone pulmonary vascular prominence but without findings of interstitial or airspace edema.  IMPRESSION: 1. Moderate enlargement of the cardiopericardial silhouette with pulmonary venous hypertension but without overt edema. 2. Atherosclerotic arch. 3. Prior CABG.   Electronically Signed   By: Van Clines M.D.   On: 04/25/2015 16:29    I have reviewed the patient'[name] current medications.  Assessment/Plan: Problem #1 acute kidney injury: Her basic metabolic panel is not drawn this morning because patient is very uncooperative and resisting any type of blood work. Hence was out her basic metabolic panel very difficult to assess her renal function as well as her potassium. Patient with history of dementia and at this moment seems to be getting worse. Possibly patient may need to be DO NOT RESUSCITATE. Plan: I will give her kayexalate 30 g by mouth if she can take it ,since her potassium was high yesterday We'll try to check basic metabolic panel in the morning if patient is agreeable.    LOS: 2 days   [name] 04/27/2015,7:51 AM

## 2015-04-27 NOTE — Care Management Note (Signed)
Case Management Note  Patient Details  Name: [name] MRN: [id number] Date of Birth: 12/30/1940  Expected Discharge Date:  04/27/15               Expected Discharge Plan:  [place]  In-House Referral:  NA  Discharge planning Services  CM Consult  Post Acute Care Choice:  Resumption of Svcs/PTA Provider Choice offered to:     DME Arranged:    DME Agency:     HH Arranged:    [place] Agency:     Status of Service:  Completed, signed off  Medicare Important Message Given:  Yes Date Medicare IM Given:  04/27/15 Medicare IM give by:  Jolene Provost, RN, MSN, CM  Date Additional Medicare IM Given:    Additional Medicare Important Message give by:     If discussed at [place] of Stay Meetings, dates discussed:    Additional Comments: Anticipate discharge home over weekend. CM spoke with Daughter [name] over phone and she anticipates no new needs. [facility] will be notified of discharge and faxed order to resume [facility] services. No further CM needs anticipated.  Sherald Barge, RN 04/27/2015, 12:26 PM

## 2015-04-27 NOTE — Progress Notes (Signed)
Pt is continuously trying to climb out of the bed and is refusing to lay back down. Paged Dr.Lama who ordered .5 mg of Ativan. Will administer and continue to monitor.

## 2015-04-27 NOTE — Progress Notes (Signed)
Subjective: Patient states she feels well. Denies abdominal pain, N/V. Denies having a bowel movement. Deneis GI bleeding. Patient with a history of dementia. Sitter at the bedside reports that the patient did have a large bowel movement the night before which was brown and formed, no hematochezia or melena noted.   Objective: Vital signs in last 24 hours: Temp:  [97.4 F (36.3 C)-97.7 F (36.5 C)] 97.7 F (36.5 C) (06/03 0532) Pulse Rate:  [65-136] 75 (06/03 0532) Resp:  [20] 20 (06/03 0532) BP: (97-138)/(46-55) 138/46 mmHg (06/03 0532) SpO2:  [95 %-100 %] 95 % (06/03 0532) Weight:  [119 lb (53.978 kg)] 119 lb (53.978 kg) (06/03 0532) Last BM Date: 04/26/15 General:   Alert and pleasant Head:  Normocephalic and atraumatic. Eyes:  No icterus, sclera clear. Conjuctiva pink.   Heart:  S1, S2 present, no murmurs noted.  Lungs: Clear to auscultation bilaterally, without wheezing, rales, or rhonchi.  Abdomen:  Bowel sounds present, soft, non-tender, non-distended. No HSM or hernias noted. No rebound or guarding. No masses appreciated  Neurologic:  grossly normal neurologically. Baseline confusion. Skin:  Warm and dry, intact without significant lesions.  Psych:  Alert and cooperative. Normal mood and affect.  Intake/Output from previous day: 06/02 0701 - 06/03 0700 In: 2281 [P.O.:840; I.V.:1375; IV Piggyback:66] Out: 300 [Urine:300] Intake/Output this shift:    Lab Results:  Recent Labs  04/25/15 1643 04/26/15 0735  WBC 5.7 5.7  HGB 7.1* 8.7*  HCT 23.5* 27.9*  PLT 230 203   BMET  Recent Labs  04/25/15 1643 04/26/15 0735  NA 140 139  K 5.6* 5.8*  CL 115* 114*  CO2 16* 16*  GLUCOSE 127* 126*  BUN 134* 124*  CREATININE 4.93* 4.59*  CALCIUM 8.7* 8.3*   LFT  Recent Labs  04/25/15 1643 04/26/15 0735  PROT 8.9* 8.8*  ALBUMIN 3.5 3.4*  AST 22 25  ALT 14 14  ALKPHOS 91 106  BILITOT 0.5 0.6   PT/INR  Recent Labs  04/25/15 1643  LABPROT 16.3*  INR  1.30   Hepatitis Panel No results for input(s): HEPBSAG, HCVAB, HEPAIGM, HEPBIGM in the last 72 hours.   Studies/Results: Dg Chest Portable 1 View  04/25/2015   CLINICAL DATA:  Low hemoglobin.  Confusion.  Cardiomyopathy.  EXAM: PORTABLE CHEST - 1 VIEW  COMPARISON:  01/02/2015  FINDINGS: Moderate enlargement of the cardiopericardial silhouette, cardiothoracic index 66% on the AP view.  Prior CABG.  Atherosclerotic calcification of the aortic arch.  Upper zone pulmonary vascular prominence but without findings of interstitial or airspace edema.  IMPRESSION: 1. Moderate enlargement of the cardiopericardial silhouette with pulmonary venous hypertension but without overt edema. 2. Atherosclerotic arch. 3. Prior CABG.   Electronically Signed   By: Van Clines M.D.   On: 04/25/2015 16:29    Assessment: 74 year old lady with acute on chronic anemia. History of a obscure GI bleeding felt to be related to AVMs, status post enteroscopy with ablation as outlined above back in March 2016. Incomplete capsule endoscopy last year. Last colonoscopy August 2015 as outlined. Patient without any overt GI bleeding i.e. melena or hematochezia. she is Hemoccult-positive. Hemoglobin up appropriately status post transfusion. Followed closely by hematology receiving IV iron and Aranesp.  History of PEA during enteroscopy at [facility] and not a candidate for further procedures unless absolutely necessary. Continue with IV iron and Aranesp per hematology. Likely needs follow-up with [facility] for obscure GI bleeding. H/H this mornign is stable, which is reassuring.  Plan: 1. Continue to monitor for GI bleed. 2. Continue to recommend conservative treatment 3. If gross recurrence of GI bleed occurs, recommend transfer to [facility] 4. No endoscopic intervention at [facility] due to likely unable to tolerate the procedure given multiple comorbidities and history of PEA arrest at last procedure (at [facility]). 5. We will sign off at  this point due to nothing further to offer here at [facility].    Walden Field, AGNP-C Adult & Gerontological Nurse Practitioner [facility]     LOS: 2 days    04/27/2015, 9:51 AM

## 2015-04-27 NOTE — Progress Notes (Signed)
[facility]                                                                              Patient Demographics  [name], is a 74 y.o. female, DOB - May 19, 1941, LZJ:[id number]  Admit date - 04/25/2015   Admitting Physician Waldemar Dickens, MD  Outpatient Primary MD for the patient is [name], MD  LOS - 2   Chief Complaint  Patient presents with  . Abnormal Lab       Brief HPI   Patient is a 74 year old female with diabetes, hypertension, prior history of stroke, CAD, seizure disorder, dementia, CK D stage IV, chronic anemia who presented after recommended by PCP due to lab abnormalities. Patient was noted to have an anemia and renal insufficiency she also complained of generalized weakness over the last 2 days, progressively getting worse. She denied any hematochezia or Molina. Creatinine was noted to be 4.93 at the time of admission, baseline around 3.5-3.7. Hemoglobin was noted to be 7.1, baseline around 8.0-9  Assessment & Plan    Principal Problem:   GI bleed, acute blood loss anemia on chronic anemia: Hemoglobin was 8.0 on 5/23 and 9.4 on 5/9, has been slowly trending down, FOBT positive, has chronic anemia due to stage IV CK D, prior history of GI bleeds. She is followed by hematology, on Aranesp injections.Upper endoscopy from 01/18/2014 without evidence of ulceration but showing H. pylori infection. Colonoscopy from 06/26/2014 showing benign tubular adenoma. - hemoglobin currently stable since yesterday, no gross bleeding, waiting for CBC this morning. - Continue IV Protonix, continue to hold aspirin - GI consulted, recommended on conservative management, supporting hemoglobin and transfusion as needed. If patient were to develop a lower GI bleeding, transferred to [facility].   Active Problems: Acute on chronic renal insufficiency, stage IV CKD: Likely worsened due to GI bleed, baseline creatinine 3.5-3.7 - Holding torsemide, follow BMET  results - Received 1 unit packed RBCs - Appreciate nephrology medications    Weakness generalized, dementia, protein calorie malnutrition - PT consult    Seizure - Continue Keppra, currently stable    History of CVA (cerebrovascular accident) -Hold aspirin due to #1     Type I diabetes mellitus, well controlled - Continue sliding scale insulin    Hyperkalemia - Placed on Kayexalate 1    Essential hypertension - Continue Toprol-XL, Imdur    Chronic combined systolic and diastolic CHF (congestive heart failure) - Currently compensated, will give gentle hydration for 1 L, then off to avoid fluid overload, holding torsemide until creatinine closer to baseline - Hold aspirin due to #1, continue beta blocker, Imdur - Not on an ACE inhibitor due to renal insufficiency - 2-D echo 7/15 had shown EF of 02-40%, grade 2 diastolic dysfunction  Code Status: full code  Family Communication: Discussed in detail with the patient, all imaging results, lab results explained to the patient   Disposition Plan:   Time Spent in minutes   25 minutes  Procedures  None   Consults   GI   DVT Prophylaxis SCD's  Medications  Scheduled Meds: . calcitRIOL  0.25 mcg Oral Daily  .  donepezil  10 mg Oral QHS  . feeding supplement (RESOURCE BREEZE)  1 Container Oral BID BM  . furosemide  160 mg Intravenous BID  . hydrALAZINE  75 mg Oral 3 times per day  . insulin aspart  0-5 Units Subcutaneous QHS  . insulin aspart  0-9 Units Subcutaneous TID WC  . isosorbide mononitrate  15 mg Oral q morning - 10a  . levETIRAcetam  500 mg Oral BID  . metoprolol succinate  50 mg Oral Daily  . [START ON 04/29/2015] pantoprazole (PROTONIX) IV  40 mg Intravenous Q12H  . pravastatin  20 mg Oral QHS  . sodium chloride  3 mL Intravenous Q12H  . sodium polystyrene  30 g Oral Q4H   Continuous Infusions: . sodium chloride 75 mL/hr at 04/26/15 1301  . pantoprozole (PROTONIX) infusion 8 mg/hr (04/26/15 1644)  .   sodium bicarbonate infusion 1/4 NS 1000 mL 125 mL/hr at 04/27/15 0600   PRN Meds:.acetaminophen **OR** acetaminophen, ondansetron **OR** ondansetron (ZOFRAN) IV   Antibiotics   Anti-infectives    None        Subjective:   [name] was seen and examined today.  No gross bleeding. No abdominal pain, nausea or vomiting. Patient denies dizziness, chest pain, shortness of breath, new weakness, numbess, tingling. No acute events overnight.  No active bleeding   Objective:   Blood pressure 138/46, pulse 75, temperature 97.7 F (36.5 C), temperature source Oral, resp. rate 20, height 5\' 4"  (1.626 m), weight 53.978 kg (119 lb), SpO2 95 %.  Wt Readings from Last 3 Encounters:  04/27/15 53.978 kg (119 lb)  04/19/15 54.432 kg (120 lb)  04/16/15 52.98 kg (116 lb 12.8 oz)     Intake/Output Summary (Last 24 hours) at 04/27/15 1034 Last data filed at 04/27/15 0630  Gross per 24 hour  Intake   2281 ml  Output    300 ml  Net   1981 ml    Exam  General: Alert and oriented x 2, NAD, frail appearing   HEENT:  PERRLA, EOMI  Neck: Supple, no JVD, no masses  CVS: S1 S2 auscultated, 2/6 systolic murmur, RRR  Respiratory: CTAB  Abdomen: Soft, nontender, nondistended, + bowel sounds  Ext: no cyanosis clubbing or edema  Neuro: AAOx2, Cr N's II- XII. Strength 5/5 upper and lower extremities bilaterally  Skin: No rashes  Psych: Normal affect and demeanor, alert and oriented x2   Data Review   Micro Results No results found for this or any previous visit (from the past 240 hour(s)).  Radiology Reports Dg Chest Portable 1 View  04/25/2015   CLINICAL DATA:  Low hemoglobin.  Confusion.  Cardiomyopathy.  EXAM: PORTABLE CHEST - 1 VIEW  COMPARISON:  01/02/2015  FINDINGS: Moderate enlargement of the cardiopericardial silhouette, cardiothoracic index 66% on the AP view.  Prior CABG.  Atherosclerotic calcification of the aortic arch.  Upper zone pulmonary vascular prominence but  without findings of interstitial or airspace edema.  IMPRESSION: 1. Moderate enlargement of the cardiopericardial silhouette with pulmonary venous hypertension but without overt edema. 2. Atherosclerotic arch. 3. Prior CABG.   Electronically Signed   By: Van Clines M.D.   On: 04/25/2015 16:29    CBC  Recent Labs Lab 04/25/15 1643 04/26/15 0735  WBC 5.7 5.7  HGB 7.1* 8.7*  HCT 23.5* 27.9*  PLT 230 203  MCV 91.8 90.6  MCH 27.7 28.2  MCHC 30.2 31.2  RDW 17.1* 16.2*  LYMPHSABS 1.6  --   MONOABS 0.5  --  EOSABS 0.1  --   BASOSABS 0.0  --     Chemistries   Recent Labs Lab 04/25/15 1643 04/26/15 0735  NA 140 139  K 5.6* 5.8*  CL 115* 114*  CO2 16* 16*  GLUCOSE 127* 126*  BUN 134* 124*  CREATININE 4.93* 4.59*  CALCIUM 8.7* 8.3*  AST 22 25  ALT 14 14  ALKPHOS 91 106  BILITOT 0.5 0.6   ------------------------------------------------------------------------------------------------------------------ estimated creatinine clearance is 9.2 mL/min (by C-G formula based on Cr of 4.59). ------------------------------------------------------------------------------------------------------------------  Recent Labs  04/25/15 1648  HGBA1C 5.9*   ------------------------------------------------------------------------------------------------------------------ No results for input(s): CHOL, HDL, LDLCALC, TRIG, CHOLHDL, LDLDIRECT in the last 72 hours. ------------------------------------------------------------------------------------------------------------------ No results for input(s): TSH, T4TOTAL, T3FREE, THYROIDAB in the last 72 hours.  Invalid input(s): FREET3 ------------------------------------------------------------------------------------------------------------------ No results for input(s): VITAMINB12, FOLATE, FERRITIN, TIBC, IRON, RETICCTPCT in the last 72 hours.  Coagulation profile  Recent Labs Lab 04/25/15 1643  INR 1.30    No results for  input(s): DDIMER in the last 72 hours.  Cardiac Enzymes No results for input(s): CKMB, TROPONINI, MYOGLOBIN in the last 168 hours.  Invalid input(s): CK ------------------------------------------------------------------------------------------------------------------ Invalid input(s): Comunas  04/25/15 1928 04/26/15 0755 04/26/15 1154 04/26/15 1701 04/26/15 2021 04/27/15 0831  GLUCAP 119* 124* 130* 159* 38* 14     [name] M.D. [facility] 04/27/2015, 10:34 AM  Pager: [phone]   Between 7am to 7pm - call Pager - [phone]  After 7pm go to [email or web address] - password [email or web address]  Call night coverage person covering after 7pm

## 2015-04-27 NOTE — Clinical Documentation Improvement (Signed)
Possible Clinical Conditions?  Severe Malnutrition   Protein Calorie Malnutrition Severe Protein Calorie Malnutrition Other Condition Cannot clinically determine  Supporting Information:Initial Nutrition Assessment by Jeneen Rinks, RD at 04/26/2015 4:25 PM   DOCUMENTATION CODES:  Severe malnutrition in context of chronic illness   NUTRITION DIAGNOSIS:  Impaired nutrient utilization related to chronic illness as evidenced by estimated needs, moderate depletion of body fat, moderate depletions of muscle mass.   INTERVENTION:  Resource Breeze po BID, each supplement provides 250 kcal and 9 grams of protein  Recommend limit high potassium foods  Lower-protein intake recommended secondary to CKD Stage IV  Follow diet advancement   Thank You, Alessandra Grout, RN, BSN, CCDS,Clinical Documentation Specialist:  [phone]  [phone]=[facility] [place]- Health Information Management

## 2015-04-28 DIAGNOSIS — N184 Chronic kidney disease, stage 4 (severe): Secondary | ICD-10-CM

## 2015-04-28 DIAGNOSIS — E43 Unspecified severe protein-calorie malnutrition: Secondary | ICD-10-CM | POA: Insufficient documentation

## 2015-04-28 DIAGNOSIS — I5042 Chronic combined systolic (congestive) and diastolic (congestive) heart failure: Secondary | ICD-10-CM

## 2015-04-28 DIAGNOSIS — N179 Acute kidney failure, unspecified: Secondary | ICD-10-CM

## 2015-04-28 DIAGNOSIS — N189 Chronic kidney disease, unspecified: Secondary | ICD-10-CM

## 2015-04-28 DIAGNOSIS — D631 Anemia in chronic kidney disease: Secondary | ICD-10-CM

## 2015-04-28 LAB — BASIC METABOLIC PANEL
Anion gap: 11 (ref 5–15)
BUN: 87 mg/dL — ABNORMAL HIGH (ref 6–20)
CO2: 23 mmol/L (ref 22–32)
Calcium: 7.4 mg/dL — ABNORMAL LOW (ref 8.9–10.3)
Chloride: 103 mmol/L (ref 101–111)
Creatinine, Ser: 3.17 mg/dL — ABNORMAL HIGH (ref 0.44–1.00)
GFR calc Af Amer: 16 mL/min — ABNORMAL LOW (ref >60)
GFR, EST NON AFRICAN AMERICAN: 13 mL/min — AB (ref >60)
Glucose, Bld: 99 mg/dL (ref 65–99)
Potassium: 3.3 mmol/L — ABNORMAL LOW (ref 3.5–5.1)
SODIUM: 137 mmol/L (ref 135–145)

## 2015-04-28 LAB — CBC
HCT: 26.6 % — ABNORMAL LOW (ref 36.0–46.0)
HEMOGLOBIN: 8.5 g/dL — AB (ref 12.0–15.0)
MCH: 27.9 pg (ref 26.0–34.0)
MCHC: 32 g/dL (ref 30.0–36.0)
MCV: 87.2 fL (ref 78.0–100.0)
PLATELETS: 182 10*3/uL (ref 150–400)
RBC: 3.05 MIL/uL — ABNORMAL LOW (ref 3.87–5.11)
RDW: 16.2 % — ABNORMAL HIGH (ref 11.5–15.5)
WBC: 4.7 10*3/uL (ref 4.0–10.5)

## 2015-04-28 LAB — GLUCOSE, CAPILLARY
Glucose-Capillary: 88 mg/dL (ref 65–99)
Glucose-Capillary: 126 mg/dL — ABNORMAL HIGH (ref 65–99)

## 2015-04-28 NOTE — Discharge Summary (Signed)
Physician Discharge Summary  [name] XBJ:[id number] DOB: 08/15/1941 DOA: 04/25/2015  PCP: [name], MD  Admit date: 04/25/2015 Discharge date: 04/28/2015  Time spent: 45 minutes  Recommendations for Outpatient Follow-up:  -Will be discharged home today. -Advised to follow-up with primary care provider in 1 week. -We'll basic metabolic profile in one week to follow renal function and potassium levels.   Discharge Diagnoses:  Principal Problem:   GI bleed Active Problems:   Weakness generalized   Dementia   Acute renal failure superimposed on stage 4 chronic kidney disease   Seizure   History of CVA (cerebrovascular accident)   Generalized weakness   Protein calorie malnutrition   Type I diabetes mellitus, well controlled   Hyperkalemia   Essential hypertension   Chronic combined systolic and diastolic CHF (congestive heart failure)   Anemia in chronic kidney disease (CKD)   Heme positive stool   Protein-calorie malnutrition, severe   Discharge Condition: Stable and improved  Filed Weights   04/25/15 2024 04/27/15 0532 04/28/15 0627  Weight: 52.6 kg (115 lb 15.4 oz) 53.978 kg (119 lb) 52.118 kg (114 lb 14.4 oz)    History of present illness:  [name] is a 74 y.o. female  Patient is presenting today per request by her home doctor due to "lab abnormalities ". Of note patient states that her hemoglobin has apparently dropped significantly and her kidney function is worse. He is also complaining of generalized weakness over the last 2 days. The symptoms are constant and getting worse. Denies any melena or hematochezia, she continues to make urine on a regular basis. Oral intake is preserved. Patient has not tried anything for her symptoms. Per report patient's hemoglobin 2 days ago was 10.  Hospital Course:   GI bleed, acute blood loss anemia on top of anemia chronic disease -Patient was FOBT positive, has chronic anemia due to stage IV chronic kidney  disease and prior history of GI bleeds. She is followed by hematology and receives Aranesp injections. -Had EGD in February 2015 that showed no gross bleeding. -Plan to continue to hold aspirin, GI was consulted and they recommended conservative management, supporting hemoglobin and transfusion as needed. As patient had prior pulseless electrical activity with EGD, if she were to require an EGD/colonoscopy in the future they're recommending transfer to [facility] to have this. -Patient has not had any further evidence of bleeding, hemoglobin has been stable.  Acute on chronic kidney disease stage IV -Based and creatinine around 2.5-3.7. -At his highest creatinine was around 4.5, is down to 3.9 which is improved from her baseline. -Upon discharge will transition her back torsemide as she had been receiving1 60 every 8 hours of Lasix here in the hospital.  Seizure disorder -Continue Keppra, no seizures while in the hospital.  Hyperkalemia resolved at time of discharge, infection is now hypokalemic. -Since we will be restarting her torsemide, and her renal function is improved from baseline, I will elect to restart her potassium supplement, she will need close follow-up in one week with the bleeding that to make sure that her potassium remains within range.   Procedures:  None   Consultations:  None  Discharge Instructions  Discharge Instructions    Diet - low sodium heart healthy    Complete by:  As directed      Increase activity slowly    Complete by:  As directed             Medication List  STOP taking these medications        aspirin EC 81 MG tablet      TAKE these medications        calcitRIOL 0.25 MCG capsule  Commonly known as:  ROCALTROL  Take 1 capsule (0.25 mcg total) by mouth daily.     donepezil 10 MG tablet  Commonly known as:  ARICEPT  Take 10 mg by mouth at bedtime.     folic acid 1 MG tablet  Commonly known as:  FOLVITE  Take 1 mg by mouth every  morning.     hydrALAZINE 25 MG tablet  Commonly known as:  APRESOLINE  Take 75 mg by mouth every 8 (eight) hours.     insulin detemir 100 UNIT/ML injection  Commonly known as:  LEVEMIR  Inject 0.08 mLs (8 Units total) into the skin at bedtime.     isosorbide mononitrate 30 MG 24 hr tablet  Commonly known as:  IMDUR  Take 0.5 tablets (15 mg total) by mouth every morning.     levETIRAcetam 100 MG/ML solution  Commonly known as:  KEPPRA  Take 500 mg by mouth 2 (two) times daily.     metoprolol succinate 100 MG 24 hr tablet  Commonly known as:  TOPROL-XL  Take 50 mg by mouth daily. Take with or immediately following a meal.     mupirocin ointment 2 %  Commonly known as:  BACTROBAN  Apply 1 application topically 2 (two) times daily.     pantoprazole 40 MG tablet  Commonly known as:  PROTONIX  Take 40 mg by mouth every morning.     potassium chloride 10 MEQ tablet  Commonly known as:  K-DUR  Take 1 tablet (10 mEq total) by mouth daily.     pravastatin 20 MG tablet  Commonly known as:  PRAVACHOL  Take 20 mg by mouth at bedtime.     RESOURCE THICKENUP CLEAR Powd  Take 120 g by mouth as needed.     torsemide 20 MG tablet  Commonly known as:  DEMADEX  Take 3 tablets (60 mg total) by mouth daily.     TRADJENTA 5 MG Tabs tablet  Generic drug:  linagliptin  Take 5 mg by mouth every morning.       No Known Allergies     Follow-up Information    Follow up with [place].   Contact information:   [address] [place] [address] [phone]       Follow up with [facility] K, MD. Schedule an appointment as soon as possible for a visit in 2 weeks.   Specialty:  Family Medicine   Contact information:   [address] [place] [address] [phone]        The results of significant diagnostics from this hospitalization (including imaging, microbiology, ancillary and laboratory) are listed below for reference.     Significant Diagnostic Studies: Dg Chest Portable 1 View  04/25/2015   CLINICAL DATA:  Low hemoglobin.  Confusion.  Cardiomyopathy.  EXAM: PORTABLE CHEST - 1 VIEW  COMPARISON:  01/02/2015  FINDINGS: Moderate enlargement of the cardiopericardial silhouette, cardiothoracic index 66% on the AP view.  Prior CABG.  Atherosclerotic calcification of the aortic arch.  Upper zone pulmonary vascular prominence but without findings of interstitial or airspace edema.  IMPRESSION: 1. Moderate enlargement of the cardiopericardial silhouette with pulmonary venous hypertension but without overt edema. 2. Atherosclerotic arch. 3. Prior CABG.   Electronically Signed  By: Van Clines M.D.   On: 04/25/2015 16:29    Microbiology: No results found for this or any previous visit (from the past 240 hour(s)).   Labs: Basic Metabolic Panel:  Recent Labs Lab 04/25/15 1643 04/26/15 0735 04/27/15 0958 04/28/15 0624  NA 140 139 138 137  K 5.6* 5.8* 4.0 3.3*  CL 115* 114* 108 103  CO2 16* 16* 18* 23  GLUCOSE 127* 126* 82 99  BUN 134* 124* 102* 87*  CREATININE 4.93* 4.59* 3.79* 3.17*  CALCIUM 8.7* 8.3* 7.5* 7.4*   Liver Function Tests:  Recent Labs Lab 04/25/15 1643 04/26/15 0735  AST 22 25  ALT 14 14  ALKPHOS 91 106  BILITOT 0.5 0.6  PROT 8.9* 8.8*  ALBUMIN 3.5 3.4*   No results for input(s): LIPASE, AMYLASE in the last 168 hours. No results for input(s): AMMONIA in the last 168 hours. CBC:  Recent Labs Lab 04/25/15 1643 04/26/15 0735 04/27/15 0958 04/28/15 0624  WBC 5.7 5.7 4.7 4.7  NEUTROABS 3.5  --   --   --   HGB 7.1* 8.7* 8.6* 8.5*  HCT 23.5* 27.9* 27.2* 26.6*  MCV 91.8 90.6 88.0 87.2  PLT 230 203 193 182   Cardiac Enzymes: No results for input(s): CKTOTAL, CKMB, CKMBINDEX, TROPONINI in the last 168 hours. BNP: BNP (last 3 results)  Recent Labs  01/02/15 1125  BNP >4500.0*    ProBNP (last 3 results)  Recent Labs  06/09/14 1021  PROBNP 30358.0*     CBG:  Recent Labs Lab 04/27/15 1232 04/27/15 1636 04/27/15 2224 04/28/15 0739 04/28/15 1140  GLUCAP 93 128* 94 88 126*       Signed:  [place]  [facility] Pager: [phone] 04/28/2015, 3:16 PM

## 2015-04-28 NOTE — Progress Notes (Signed)
Subjective: Interval History: Patient denies any nausea or vomiting. Presently she offers no complaints.  Objective: Vital signs in last 24 hours: Temp:  [97.9 F (36.6 C)-98.2 F (36.8 C)] 98.1 F (36.7 C) (06/04 0627) Pulse Rate:  [68-76] 76 (06/04 0627) Resp:  [20] 20 (06/04 0627) BP: (120-153)/(54-67) 128/54 mmHg (06/04 0627) SpO2:  [99 %-100 %] 100 % (06/04 0627) Weight:  [52.118 kg (114 lb 14.4 oz)] 52.118 kg (114 lb 14.4 oz) (06/04 0627) Weight change: -1.86 kg (-4 lb 1.6 oz)  Intake/Output from previous day: 06/03 0701 - 06/04 0700 In: 1800 [P.O.:1800] Out: 2100 [Urine:2100] Intake/Output this shift:    Patient is alert in no apparent distress. Today she seems to be more calm and cooperative. Chest is clear Heart exam revealed regular rate and rhythm no murmur Abdomen: Soft positive bowel sound Extremities no edema  Lab Results:  Recent Labs  04/27/15 0958 04/28/15 0624  WBC 4.7 4.7  HGB 8.6* 8.5*  HCT 27.2* 26.6*  PLT 193 182   BMET:   Recent Labs  04/27/15 0958 04/28/15 0624  NA 138 137  K 4.0 PENDING  CL 108 103  CO2 18* 23  GLUCOSE 82 99  BUN 102* 87*  CREATININE 3.79* 3.17*  CALCIUM 7.5* 7.4*   No results for input([name]): PTH in the last 72 hours. Iron Studies: No results for input([name]): IRON, TIBC, TRANSFERRIN, FERRITIN in the last 72 hours.  Studies/Results: No results found.  I have reviewed the patient'[name] current medications.  Assessment/Plan: Problem #1 acute kidney injury: Her BUN and creatinine is 87 and 3.17 respectively. Her renal function started improving. Patient is non-oliguric. Her BUN and creatinine is still above her baseline. Problem #2 anemia: Her hemoglobin is low but stable Problem #3 hyperkalemia: Her potassium has corrected Problem #4 metabolic bone disease: Her CO2 is 23 has improved Problem #5 dementia: At this moment seems to be worsening. Patient at times remains restless and trying to get out of bed present  nighttime. Problem #6 seizure disorder Problem #7 history of CVA Plan: We'll continue his present management We'll check her basic metabolic panel in the morning.    LOS: 3 days   [name] 04/28/2015,7:49 AM

## 2015-04-28 NOTE — Progress Notes (Signed)
[facility] called and informed of patient's discharge, order faxed at request.

## 2015-04-28 NOTE — Progress Notes (Signed)
Patient's daughter states understanding of discharge instructions 

## 2015-04-29 LAB — TYPE AND SCREEN
ABO/RH(D): A POS
ANTIBODY SCREEN: NEGATIVE
UNIT DIVISION: 0
UNIT DIVISION: 0

## 2015-04-30 ENCOUNTER — Encounter (HOSPITAL_COMMUNITY): Payer: Self-pay

## 2015-04-30 ENCOUNTER — Encounter (HOSPITAL_COMMUNITY): Payer: Medicare Other | Attending: Hematology and Oncology

## 2015-04-30 ENCOUNTER — Other Ambulatory Visit: Payer: Self-pay | Admitting: *Deleted

## 2015-04-30 ENCOUNTER — Encounter (HOSPITAL_BASED_OUTPATIENT_CLINIC_OR_DEPARTMENT_OTHER): Payer: Medicare Other

## 2015-04-30 VITALS — BP 125/56 | HR 70 | Temp 98.1°F | Resp 20

## 2015-04-30 DIAGNOSIS — I129 Hypertensive chronic kidney disease with stage 1 through stage 4 chronic kidney disease, or unspecified chronic kidney disease: Secondary | ICD-10-CM | POA: Diagnosis not present

## 2015-04-30 DIAGNOSIS — Z794 Long term (current) use of insulin: Secondary | ICD-10-CM | POA: Diagnosis not present

## 2015-04-30 DIAGNOSIS — D631 Anemia in chronic kidney disease: Secondary | ICD-10-CM

## 2015-04-30 DIAGNOSIS — Z8673 Personal history of transient ischemic attack (TIA), and cerebral infarction without residual deficits: Secondary | ICD-10-CM | POA: Insufficient documentation

## 2015-04-30 DIAGNOSIS — I252 Old myocardial infarction: Secondary | ICD-10-CM | POA: Insufficient documentation

## 2015-04-30 DIAGNOSIS — R569 Unspecified convulsions: Secondary | ICD-10-CM | POA: Insufficient documentation

## 2015-04-30 DIAGNOSIS — N184 Chronic kidney disease, stage 4 (severe): Secondary | ICD-10-CM

## 2015-04-30 DIAGNOSIS — I509 Heart failure, unspecified: Secondary | ICD-10-CM | POA: Diagnosis not present

## 2015-04-30 DIAGNOSIS — Z951 Presence of aortocoronary bypass graft: Secondary | ICD-10-CM | POA: Diagnosis not present

## 2015-04-30 DIAGNOSIS — F0390 Unspecified dementia without behavioral disturbance: Secondary | ICD-10-CM | POA: Diagnosis not present

## 2015-04-30 DIAGNOSIS — Z87898 Personal history of other specified conditions: Secondary | ICD-10-CM | POA: Diagnosis not present

## 2015-04-30 DIAGNOSIS — D509 Iron deficiency anemia, unspecified: Secondary | ICD-10-CM

## 2015-04-30 DIAGNOSIS — D62 Acute posthemorrhagic anemia: Secondary | ICD-10-CM

## 2015-04-30 DIAGNOSIS — Z87891 Personal history of nicotine dependence: Secondary | ICD-10-CM | POA: Diagnosis not present

## 2015-04-30 DIAGNOSIS — I2510 Atherosclerotic heart disease of native coronary artery without angina pectoris: Secondary | ICD-10-CM | POA: Diagnosis not present

## 2015-04-30 DIAGNOSIS — E780 Pure hypercholesterolemia: Secondary | ICD-10-CM | POA: Diagnosis not present

## 2015-04-30 DIAGNOSIS — E119 Type 2 diabetes mellitus without complications: Secondary | ICD-10-CM | POA: Diagnosis not present

## 2015-04-30 DIAGNOSIS — Z79899 Other long term (current) drug therapy: Secondary | ICD-10-CM | POA: Insufficient documentation

## 2015-04-30 DIAGNOSIS — D649 Anemia, unspecified: Secondary | ICD-10-CM | POA: Insufficient documentation

## 2015-04-30 LAB — CBC WITH DIFFERENTIAL/PLATELET
Basophils Absolute: 0 10*3/uL (ref 0.0–0.1)
Basophils Relative: 0 % (ref 0–1)
Eosinophils Absolute: 0.1 10*3/uL (ref 0.0–0.7)
Eosinophils Relative: 1 % (ref 0–5)
HCT: 25 % — ABNORMAL LOW (ref 36.0–46.0)
HEMOGLOBIN: 8 g/dL — AB (ref 12.0–15.0)
LYMPHS PCT: 14 % (ref 12–46)
Lymphs Abs: 1.1 10*3/uL (ref 0.7–4.0)
MCH: 28.2 pg (ref 26.0–34.0)
MCHC: 32 g/dL (ref 30.0–36.0)
MCV: 88 fL (ref 78.0–100.0)
MONOS PCT: 9 % (ref 3–12)
Monocytes Absolute: 0.6 10*3/uL (ref 0.1–1.0)
NEUTROS PCT: 76 % (ref 43–77)
Neutro Abs: 5.6 10*3/uL (ref 1.7–7.7)
Platelets: 193 10*3/uL (ref 150–400)
RBC: 2.84 MIL/uL — AB (ref 3.87–5.11)
RDW: 15.8 % — ABNORMAL HIGH (ref 11.5–15.5)
WBC: 7.3 10*3/uL (ref 4.0–10.5)

## 2015-04-30 LAB — FERRITIN: FERRITIN: 165 ng/mL (ref 11–307)

## 2015-04-30 MED ORDER — DARBEPOETIN ALFA 100 MCG/0.5ML IJ SOSY
PREFILLED_SYRINGE | INTRAMUSCULAR | Status: AC
  Filled 2015-04-30: qty 0.5

## 2015-04-30 MED ORDER — DARBEPOETIN ALFA 100 MCG/0.5ML IJ SOSY
80.0000 ug | PREFILLED_SYRINGE | Freq: Once | INTRAMUSCULAR | Status: AC
  Administered 2015-04-30: 80 ug via SUBCUTANEOUS

## 2015-04-30 NOTE — Patient Instructions (Addendum)
[place] at [facility] Discharge Instructions  RECOMMENDATIONS MADE BY THE CONSULTANT AND ANY TEST RESULTS WILL BE SENT TO YOUR REFERRING PHYSICIAN.  You received your Aranesp injection today. Call for concerns or questions. Will see you in 2 weeks for your next injection.  Thank you for choosing [place] at [facility] to provide your oncology and hematology care.  To afford each patient quality time with our provider, please arrive at least 15 minutes before your scheduled appointment time.    You need to re-schedule your appointment should you arrive 10 or more minutes late.  We strive to give you quality time with our providers, and arriving late affects you and other patients whose appointments are after yours.  Also, if you no show three or more times for appointments you may be dismissed from the clinic at the providers discretion.     Again, thank you for choosing [facility].  Our hope is that these requests will decrease the amount of time that you wait before being seen by our physicians.       _____________________________________________________________  Should you have questions after your visit to [facility], please contact our office at [phone] between the hours of 8:30 a.m. and 4:30 p.m.  Voicemails left after 4:30 p.m. will not be returned until the following business day.  For prescription refill requests, have your pharmacy contact our office.

## 2015-04-30 NOTE — Progress Notes (Signed)
[name] presents today for injection per MD orders. Aranesp 68mmcg administered SQ in right Abdomen. Administration without incident. Patient tolerated well.   Hemoglobin result was reported to [employer] PA-C

## 2015-04-30 NOTE — Patient Outreach (Signed)
TOC week #1 completed. Spoke with patient daughter, [name]. Discussed recent hospitalization, she reports patient was sent to hospital due to low blood and bleeding from rectum.  She reports that patient has follow up appointment today at 1 and she is getting her ready to go to the appointment. RNCM reviewed medications, daughter reports no changes to medications. RNCM verified daughter aware to call PCP for issues and that she has RNCM contact for questions also. Plan to continue TOC per protocol. [name]. [name], RN, BSN, [place] [phone]

## 2015-04-30 NOTE — Progress Notes (Signed)
LABS DRAWN

## 2015-05-08 ENCOUNTER — Other Ambulatory Visit (HOSPITAL_COMMUNITY): Payer: Self-pay

## 2015-05-10 ENCOUNTER — Other Ambulatory Visit: Payer: Self-pay | Admitting: *Deleted

## 2015-05-10 NOTE — Patient Outreach (Signed)
[place] [facility]) Care Management  05/10/2015  [name] [birth year] [id number]  I spoke with [name] daughter [name] today regarding [name]'s progress/transition of care. [name] was recently discharged from the hospital after being sent to the ED when her provider found her to be profoundly anemic in the office. [name] says that [name] has "perked up" and feels much better since getting a blood transfusion. [name] confirms that she has all needed medications and supplies. [name] has a provider appointment on Tuesday and [name] will be able to provide transportation and attend the appointment with [name].   [facility] Care Management will continue to follow closely and provide ongoing [place] Management support.    [place] Management  [phone]

## 2015-05-14 ENCOUNTER — Encounter (HOSPITAL_COMMUNITY): Payer: Self-pay

## 2015-05-14 ENCOUNTER — Other Ambulatory Visit (HOSPITAL_COMMUNITY): Payer: Self-pay | Admitting: Oncology

## 2015-05-14 ENCOUNTER — Encounter (HOSPITAL_BASED_OUTPATIENT_CLINIC_OR_DEPARTMENT_OTHER): Payer: Medicare Other

## 2015-05-14 ENCOUNTER — Telehealth (HOSPITAL_COMMUNITY): Payer: Self-pay

## 2015-05-14 DIAGNOSIS — D631 Anemia in chronic kidney disease: Secondary | ICD-10-CM

## 2015-05-14 DIAGNOSIS — N184 Chronic kidney disease, stage 4 (severe): Secondary | ICD-10-CM

## 2015-05-14 DIAGNOSIS — K625 Hemorrhage of anus and rectum: Secondary | ICD-10-CM

## 2015-05-14 DIAGNOSIS — D509 Iron deficiency anemia, unspecified: Secondary | ICD-10-CM

## 2015-05-14 DIAGNOSIS — D62 Acute posthemorrhagic anemia: Secondary | ICD-10-CM

## 2015-05-14 DIAGNOSIS — D649 Anemia, unspecified: Secondary | ICD-10-CM | POA: Diagnosis not present

## 2015-05-14 LAB — CBC WITH DIFFERENTIAL/PLATELET
BASOS ABS: 0 10*3/uL (ref 0.0–0.1)
BASOS PCT: 0 % (ref 0–1)
Eosinophils Absolute: 0.3 10*3/uL (ref 0.0–0.7)
Eosinophils Relative: 3 % (ref 0–5)
HCT: 22.1 % — ABNORMAL LOW (ref 36.0–46.0)
HEMOGLOBIN: 6.8 g/dL — AB (ref 12.0–15.0)
Lymphocytes Relative: 15 % (ref 12–46)
Lymphs Abs: 1.3 10*3/uL (ref 0.7–4.0)
MCH: 26.9 pg (ref 26.0–34.0)
MCHC: 30.8 g/dL (ref 30.0–36.0)
MCV: 87.4 fL (ref 78.0–100.0)
MONO ABS: 0.4 10*3/uL (ref 0.1–1.0)
Monocytes Relative: 5 % (ref 3–12)
NEUTROS ABS: 6.4 10*3/uL (ref 1.7–7.7)
NEUTROS PCT: 77 % (ref 43–77)
Platelets: 404 10*3/uL — ABNORMAL HIGH (ref 150–400)
RBC: 2.53 MIL/uL — ABNORMAL LOW (ref 3.87–5.11)
RDW: 16.2 % — AB (ref 11.5–15.5)
WBC: 8.3 10*3/uL (ref 4.0–10.5)

## 2015-05-14 LAB — PREPARE RBC (CROSSMATCH)

## 2015-05-14 MED ORDER — DARBEPOETIN ALFA 100 MCG/0.5ML IJ SOSY
80.0000 ug | PREFILLED_SYRINGE | Freq: Once | INTRAMUSCULAR | Status: AC
  Administered 2015-05-14: 80 ug via SUBCUTANEOUS

## 2015-05-14 MED ORDER — DARBEPOETIN ALFA 100 MCG/0.5ML IJ SOSY
PREFILLED_SYRINGE | INTRAMUSCULAR | Status: AC
  Filled 2015-05-14: qty 0.5

## 2015-05-14 NOTE — Telephone Encounter (Signed)
.  CRITICAL VALUE ALERT Critical value received:  6.8 HGB Date of notification:  05-14-2015 Time of notification: 1100 Critical value read back:  Yes.   Nurse who received alert:  Forest Gleason RN MD notified (1st Chay Mazzoni):  Darral Dash PA-c

## 2015-05-14 NOTE — Patient Instructions (Signed)
[place] at [facility] Discharge Instructions  RECOMMENDATIONS MADE BY THE CONSULTANT AND ANY TEST RESULTS WILL BE SENT TO YOUR REFERRING PHYSICIAN.  You got labs drawn today. Hemoglobin low, will need blood tomorrow. Follow up as scheduled.  Thank you for choosing [place] at [facility] to provide your oncology and hematology care.  To afford each patient quality time with our provider, please arrive at least 15 minutes before your scheduled appointment time.    You need to re-schedule your appointment should you arrive 10 or more minutes late.  We strive to give you quality time with our providers, and arriving late affects you and other patients whose appointments are after yours.  Also, if you no show three or more times for appointments you may be dismissed from the clinic at the providers discretion.     Again, thank you for choosing [facility].  Our hope is that these requests will decrease the amount of time that you wait before being seen by our physicians.       _____________________________________________________________  Should you have questions after your visit to [facility], please contact our office at [phone] between the hours of 8:30 a.m. and 4:30 p.m.  Voicemails left after 4:30 p.m. will not be returned until the following business day.  For prescription refill requests, have your pharmacy contact our office.

## 2015-05-14 NOTE — Progress Notes (Signed)
Labs drawn

## 2015-05-14 NOTE — Progress Notes (Signed)
[name] presents today for injection per MD orders. Aranesp 10,000 mcg administered SQ in left Abdomen. Administration without incident. Patient tolerated well.

## 2015-05-15 ENCOUNTER — Encounter (HOSPITAL_BASED_OUTPATIENT_CLINIC_OR_DEPARTMENT_OTHER): Payer: Medicare Other

## 2015-05-15 ENCOUNTER — Encounter (HOSPITAL_COMMUNITY): Payer: Self-pay

## 2015-05-15 VITALS — BP 141/56 | HR 75 | Temp 97.6°F | Resp 20

## 2015-05-15 DIAGNOSIS — D649 Anemia, unspecified: Secondary | ICD-10-CM | POA: Diagnosis not present

## 2015-05-15 DIAGNOSIS — K625 Hemorrhage of anus and rectum: Secondary | ICD-10-CM

## 2015-05-15 DIAGNOSIS — N184 Chronic kidney disease, stage 4 (severe): Secondary | ICD-10-CM | POA: Diagnosis present

## 2015-05-15 DIAGNOSIS — D631 Anemia in chronic kidney disease: Secondary | ICD-10-CM | POA: Diagnosis not present

## 2015-05-15 MED ORDER — DIPHENHYDRAMINE HCL 25 MG PO CAPS
25.0000 mg | ORAL_CAPSULE | Freq: Once | ORAL | Status: AC
  Administered 2015-05-15: 25 mg via ORAL

## 2015-05-15 MED ORDER — DIPHENHYDRAMINE HCL 25 MG PO CAPS
ORAL_CAPSULE | ORAL | Status: AC
  Filled 2015-05-15: qty 1

## 2015-05-15 MED ORDER — ACETAMINOPHEN 325 MG PO TABS
ORAL_TABLET | ORAL | Status: AC
  Filled 2015-05-15: qty 2

## 2015-05-15 MED ORDER — SODIUM CHLORIDE 0.9 % IV SOLN
250.0000 mL | Freq: Once | INTRAVENOUS | Status: AC
  Administered 2015-05-15: 250 mL via INTRAVENOUS

## 2015-05-15 MED ORDER — ACETAMINOPHEN 325 MG PO TABS
650.0000 mg | ORAL_TABLET | Freq: Once | ORAL | Status: AC
  Administered 2015-05-15: 650 mg via ORAL

## 2015-05-15 NOTE — Progress Notes (Signed)
Patient tolerated blood transfusion well today.

## 2015-05-15 NOTE — Patient Instructions (Signed)
[employer] at [facility] Discharge Instructions  RECOMMENDATIONS MADE BY THE CONSULTANT AND ANY TEST RESULTS WILL BE SENT TO YOUR REFERRING PHYSICIAN.  You received 2 units of blood today. Will see you at your next appointment. Call for any concerns or questions.  Thank you for choosing [place] at [facility] to provide your oncology and hematology care.  To afford each patient quality time with our provider, please arrive at least 15 minutes before your scheduled appointment time.    You need to re-schedule your appointment should you arrive 10 or more minutes late.  We strive to give you quality time with our providers, and arriving late affects you and other patients whose appointments are after yours.  Also, if you no show three or more times for appointments you may be dismissed from the clinic at the providers discretion.     Again, thank you for choosing [facility].  Our hope is that these requests will decrease the amount of time that you wait before being seen by our physicians.       _____________________________________________________________  Should you have questions after your visit to [facility], please contact our office at [phone] between the hours of 8:30 a.m. and 4:30 p.m.  Voicemails left after 4:30 p.m. will not be returned until the following business day.  For prescription refill requests, have your pharmacy contact our office.

## 2015-05-16 ENCOUNTER — Other Ambulatory Visit: Payer: Self-pay | Admitting: *Deleted

## 2015-05-16 LAB — TYPE AND SCREEN
ABO/RH(D): A POS
Antibody Screen: NEGATIVE
Unit division: 0
Unit division: 0

## 2015-05-16 NOTE — Patient Outreach (Signed)
TOC Week #3 Transition of care Call to daughter [name].  Spoke with Vicky regarding patient, she reports patient is at primary care appointment this afternoon, her sister is taking patient to appointment. She reports patient HGB was down to below 7 yesterday at cancer center and patient received 2 units of blood. She also reports they have discussed some type of "procedure" but patient and family was told at [facility] that she is at too high risk to have any surgeries, so she hopes that nothing will come of any needs for procedures.  Patient continues to go to [facility] for transfusions and follow up on labs. No acute needs assessed this call.  Plan to make an outreach visit next week for ongoing [facility] services.  [name]. [name], RN, BSN, [place] [phone]

## 2015-05-22 ENCOUNTER — Other Ambulatory Visit: Payer: Self-pay | Admitting: *Deleted

## 2015-05-22 NOTE — Patient Outreach (Signed)
[facility]) Care Management   05/22/2015  [name] Dec 12, 1940 [id number]  [name] is an 74 y.o. female   Visit made with patient, daughter [name] and [facility] [name]  Subjective:  Patient reports she is doing well, no pain  Daughter reports patient has appointment with primary care due to "summer cold" Patient has sinus congestion Wt down to 103#, daughter reports poor appetite, plans to use BOOST glucose control. Also, reporting blood sugars have been elevated.     Objective:  BP 100/52 mmHg  Pulse 80  Temp(Src) 97.8 F (36.6 C) (Oral)  Resp 20  Ht 1.626 m (5\' 4" )  Wt 103 lb (46.72 kg)  BMI 17.67 kg/m2  SpO2 98%   Review of Systems  Constitutional: Negative.   HENT: Negative.   Respiratory: Positive for cough and sputum production.   Cardiovascular: Negative.   Gastrointestinal: Negative.  Negative for nausea.  Genitourinary: Negative.   Musculoskeletal: Negative.   Skin: Negative.   Neurological: Negative.     Physical Exam  Constitutional: She appears well-developed.  Cardiovascular: Normal rate and regular rhythm.   Respiratory: Effort normal and breath sounds normal.  GI: Soft. Bowel sounds are normal.  Musculoskeletal: Normal range of motion.  Neurological: She is alert.  Skin: Skin is warm.  Psychiatric: She has a normal mood and affect.    Current Medications:   Current Outpatient Prescriptions  Medication Sig Dispense Refill  . calcitRIOL (ROCALTROL) 0.25 MCG capsule Take 1 capsule (0.25 mcg total) by mouth daily. 31 capsule 0  . donepezil (ARICEPT) 10 MG tablet Take 10 mg by mouth at bedtime.     . folic acid (FOLVITE) 1 MG tablet Take 1 mg by mouth every morning.     . hydrALAZINE (APRESOLINE) 25 MG tablet Take 75 mg by mouth every 8 (eight) hours.     . insulin detemir (LEVEMIR) 100 UNIT/ML injection Inject 0.08 mLs (8 Units total) into the skin at bedtime. 10 mL 0  . isosorbide mononitrate (IMDUR) 30 MG 24 hr  tablet Take 0.5 tablets (15 mg total) by mouth every morning. 30 tablet 0  . levETIRAcetam (KEPPRA) 100 MG/ML solution Take 500 mg by mouth 2 (two) times daily.     Marland Kitchen linagliptin (TRADJENTA) 5 MG TABS tablet Take 5 mg by mouth every morning.     . Maltodextrin-Xanthan Gum (RESOURCE THICKENUP CLEAR) POWD Take 120 g by mouth as needed. (Patient taking differently: Take 1 Can by mouth daily. )    . metoprolol succinate (TOPROL-XL) 100 MG 24 hr tablet Take 50 mg by mouth daily. Take with or immediately following a meal.    . mupirocin ointment (BACTROBAN) 2 % Apply 1 application topically 2 (two) times daily.     . pantoprazole (PROTONIX) 40 MG tablet Take 40 mg by mouth every morning.    . potassium chloride (K-DUR) 10 MEQ tablet Take 1 tablet (10 mEq total) by mouth daily. 90 tablet 3  . pravastatin (PRAVACHOL) 20 MG tablet Take 20 mg by mouth at bedtime.     . torsemide (DEMADEX) 20 MG tablet Take 3 tablets (60 mg total) by mouth daily. 90 tablet 11   No current facility-administered medications for this visit.    Functional Status:   In your present state of health, do you have any difficulty performing the following activities: 04/25/2015 03/22/2015  Hearing? N N  Vision? N N  Difficulty concentrating or making decisions? [name]  Walking or climbing stairs?  Y Y  Dressing or bathing? Y Y  Doing errands, shopping? [name]  Preparing Food and eating ? - Y  Using the Toilet? - Y  In the past six months, have you accidently leaked urine? - Y  Do you have problems with loss of bowel control? - Y  Managing your Medications? - Y  Managing your Finances? - Y  Housekeeping or managing your Housekeeping? - -    Fall/Depression Screening:    PHQ 2/9 Scores 03/22/2015 10/16/2014 09/26/2014  PHQ - 2 Score 0 0 0    Assessment:   Call to Primary care MD office report low BP reading, weight loss and dose of torsemide Wrote out concerns for patient daughter to give to patient other daughter for MD  appointment tomorrow Continued weight loss, needs new digital scale RNCM gave contact information to [facility] for any questions or concerns. She is seeing patient weekly for  Couple  More weeks.  Reviewed with daughter questions around dialysis to discuss at next kidney MD appointment 06/06/15 Upcoming appointments: July 13-Kidney June 29 Primary Care    Plan:  Bring digital scale to patient for more accurate weights Visit again next month  Kimberly Mundo E. [name], RN, BSN, [place] [phone]

## 2015-05-23 ENCOUNTER — Other Ambulatory Visit: Payer: Self-pay | Admitting: *Deleted

## 2015-05-23 NOTE — Patient Outreach (Signed)
[place] [facility] Care Management  05/23/2015  Arlen Legendre Dripps 10-09-1941 [id number]   Visit made with patient and daughter to deliver digital scale.  Reviewed use of scale and verified it was working. Answered question regarding scale, daughter verbalized understanding of use of scale. Daughter will continue to weigh patient on both scales for few days and document, then just Weigh on the digital scales. Patient did see primary care, patient other daughter took patient, [name] states she does not think He made any changes to anything but she will verify with sister later in the day and will call RNCM if any questions or concerns. Also, will start giving patient boost nutrtional supplement. Plan visit in July.  [name]. [name], RN, BSN, [place] [phone]

## 2015-05-24 ENCOUNTER — Other Ambulatory Visit (HOSPITAL_COMMUNITY): Payer: Self-pay

## 2015-05-28 ENCOUNTER — Inpatient Hospital Stay (HOSPITAL_COMMUNITY)
Admission: EM | Admit: 2015-05-28 | Discharge: 2015-06-03 | DRG: 683 | Disposition: A | Discharge status: Home Health | Payer: Medicare Other | Attending: Internal Medicine | Admitting: Internal Medicine

## 2015-05-28 ENCOUNTER — Emergency Department (HOSPITAL_COMMUNITY): Payer: Medicare Other

## 2015-05-28 ENCOUNTER — Encounter (HOSPITAL_COMMUNITY): Payer: Self-pay | Admitting: *Deleted

## 2015-05-28 DIAGNOSIS — Z87891 Personal history of nicotine dependence: Secondary | ICD-10-CM | POA: Diagnosis not present

## 2015-05-28 DIAGNOSIS — E782 Mixed hyperlipidemia: Secondary | ICD-10-CM | POA: Diagnosis present

## 2015-05-28 DIAGNOSIS — N179 Acute kidney failure, unspecified: Principal | ICD-10-CM | POA: Diagnosis present

## 2015-05-28 DIAGNOSIS — I252 Old myocardial infarction: Secondary | ICD-10-CM

## 2015-05-28 DIAGNOSIS — E870 Hyperosmolality and hypernatremia: Secondary | ICD-10-CM | POA: Diagnosis present

## 2015-05-28 DIAGNOSIS — R05 Cough: Secondary | ICD-10-CM | POA: Diagnosis present

## 2015-05-28 DIAGNOSIS — R059 Cough, unspecified: Secondary | ICD-10-CM

## 2015-05-28 DIAGNOSIS — Z8673 Personal history of transient ischemic attack (TIA), and cerebral infarction without residual deficits: Secondary | ICD-10-CM

## 2015-05-28 DIAGNOSIS — N184 Chronic kidney disease, stage 4 (severe): Secondary | ICD-10-CM | POA: Diagnosis present

## 2015-05-28 DIAGNOSIS — N189 Chronic kidney disease, unspecified: Secondary | ICD-10-CM | POA: Diagnosis not present

## 2015-05-28 DIAGNOSIS — G40909 Epilepsy, unspecified, not intractable, without status epilepticus: Secondary | ICD-10-CM | POA: Diagnosis present

## 2015-05-28 DIAGNOSIS — Z833 Family history of diabetes mellitus: Secondary | ICD-10-CM

## 2015-05-28 DIAGNOSIS — N19 Unspecified kidney failure: Secondary | ICD-10-CM | POA: Diagnosis present

## 2015-05-28 DIAGNOSIS — D649 Anemia, unspecified: Secondary | ICD-10-CM

## 2015-05-28 DIAGNOSIS — E876 Hypokalemia: Secondary | ICD-10-CM | POA: Diagnosis not present

## 2015-05-28 DIAGNOSIS — Z951 Presence of aortocoronary bypass graft: Secondary | ICD-10-CM | POA: Diagnosis not present

## 2015-05-28 DIAGNOSIS — R7989 Other specified abnormal findings of blood chemistry: Secondary | ICD-10-CM

## 2015-05-28 DIAGNOSIS — I129 Hypertensive chronic kidney disease with stage 1 through stage 4 chronic kidney disease, or unspecified chronic kidney disease: Secondary | ICD-10-CM | POA: Diagnosis present

## 2015-05-28 DIAGNOSIS — E1122 Type 2 diabetes mellitus with diabetic chronic kidney disease: Secondary | ICD-10-CM | POA: Diagnosis not present

## 2015-05-28 DIAGNOSIS — F0390 Unspecified dementia without behavioral disturbance: Secondary | ICD-10-CM | POA: Diagnosis present

## 2015-05-28 DIAGNOSIS — E119 Type 2 diabetes mellitus without complications: Secondary | ICD-10-CM

## 2015-05-28 DIAGNOSIS — Z823 Family history of stroke: Secondary | ICD-10-CM

## 2015-05-28 DIAGNOSIS — A047 Enterocolitis due to Clostridium difficile: Secondary | ICD-10-CM | POA: Diagnosis present

## 2015-05-28 DIAGNOSIS — D631 Anemia in chronic kidney disease: Secondary | ICD-10-CM | POA: Diagnosis present

## 2015-05-28 DIAGNOSIS — E872 Acidosis: Secondary | ICD-10-CM | POA: Diagnosis present

## 2015-05-28 DIAGNOSIS — D509 Iron deficiency anemia, unspecified: Secondary | ICD-10-CM | POA: Diagnosis present

## 2015-05-28 DIAGNOSIS — R778 Other specified abnormalities of plasma proteins: Secondary | ICD-10-CM | POA: Diagnosis present

## 2015-05-28 DIAGNOSIS — E875 Hyperkalemia: Secondary | ICD-10-CM | POA: Diagnosis present

## 2015-05-28 LAB — URINALYSIS, ROUTINE W REFLEX MICROSCOPIC
Bilirubin Urine: NEGATIVE
Glucose, UA: NEGATIVE mg/dL
HGB URINE DIPSTICK: NEGATIVE
Ketones, ur: NEGATIVE mg/dL
Leukocytes, UA: NEGATIVE
Nitrite: NEGATIVE
PH: 5.5 (ref 5.0–8.0)
Specific Gravity, Urine: 1.02 (ref 1.005–1.030)
Urobilinogen, UA: 0.2 mg/dL (ref 0.0–1.0)

## 2015-05-28 LAB — BRAIN NATRIURETIC PEPTIDE: B NATRIURETIC PEPTIDE 5: 1084 pg/mL — AB (ref 0.0–100.0)

## 2015-05-28 LAB — CBC WITH DIFFERENTIAL/PLATELET
Basophils Absolute: 0 10*3/uL (ref 0.0–0.1)
Basophils Relative: 0 % (ref 0–1)
EOS ABS: 0.1 10*3/uL (ref 0.0–0.7)
Eosinophils Relative: 1 % (ref 0–5)
HEMATOCRIT: 26.3 % — AB (ref 36.0–46.0)
Hemoglobin: 7.9 g/dL — ABNORMAL LOW (ref 12.0–15.0)
Lymphocytes Relative: 5 % — ABNORMAL LOW (ref 12–46)
Lymphs Abs: 0.5 10*3/uL — ABNORMAL LOW (ref 0.7–4.0)
MCH: 27.5 pg (ref 26.0–34.0)
MCHC: 30 g/dL (ref 30.0–36.0)
MCV: 91.6 fL (ref 78.0–100.0)
MONOS PCT: 4 % (ref 3–12)
Monocytes Absolute: 0.3 10*3/uL (ref 0.1–1.0)
NEUTROS ABS: 8.3 10*3/uL — AB (ref 1.7–7.7)
NEUTROS PCT: 90 % — AB (ref 43–77)
Platelets: 288 10*3/uL (ref 150–400)
RBC: 2.87 MIL/uL — ABNORMAL LOW (ref 3.87–5.11)
RDW: 18.4 % — AB (ref 11.5–15.5)
WBC: 9.3 10*3/uL (ref 4.0–10.5)

## 2015-05-28 LAB — BASIC METABOLIC PANEL
ANION GAP: 10 (ref 5–15)
BUN: 180 mg/dL — AB (ref 6–20)
CHLORIDE: 126 mmol/L — AB (ref 101–111)
CO2: 9 mmol/L — ABNORMAL LOW (ref 22–32)
Calcium: 8.7 mg/dL — ABNORMAL LOW (ref 8.9–10.3)
Creatinine, Ser: 6.75 mg/dL — ABNORMAL HIGH (ref 0.44–1.00)
GFR, EST AFRICAN AMERICAN: 6 mL/min — AB (ref >60)
GFR, EST NON AFRICAN AMERICAN: 5 mL/min — AB (ref >60)
GLUCOSE: 175 mg/dL — AB (ref 65–99)
Potassium: 6.6 mmol/L (ref 3.5–5.1)
SODIUM: 145 mmol/L (ref 135–145)

## 2015-05-28 LAB — COMPREHENSIVE METABOLIC PANEL
ALT: 17 U/L (ref 14–54)
ANION GAP: 10 (ref 5–15)
AST: 22 U/L (ref 15–41)
Albumin: 3.2 g/dL — ABNORMAL LOW (ref 3.5–5.0)
Alkaline Phosphatase: 119 U/L (ref 38–126)
BUN: 222 mg/dL — ABNORMAL HIGH (ref 6–20)
CO2: 10 mmol/L — ABNORMAL LOW (ref 22–32)
Calcium: 8.6 mg/dL — ABNORMAL LOW (ref 8.9–10.3)
Chloride: 122 mmol/L — ABNORMAL HIGH (ref 101–111)
Creatinine, Ser: 7.16 mg/dL — ABNORMAL HIGH (ref 0.44–1.00)
GFR calc Af Amer: 6 mL/min — ABNORMAL LOW (ref >60)
GFR calc non Af Amer: 5 mL/min — ABNORMAL LOW (ref >60)
Glucose, Bld: 214 mg/dL — ABNORMAL HIGH (ref 65–99)
POTASSIUM: 7.4 mmol/L — AB (ref 3.5–5.1)
Sodium: 142 mmol/L (ref 135–145)
Total Bilirubin: 0.5 mg/dL (ref 0.3–1.2)
Total Protein: 9.5 g/dL — ABNORMAL HIGH (ref 6.5–8.1)

## 2015-05-28 LAB — GLUCOSE, CAPILLARY
Glucose-Capillary: 146 mg/dL — ABNORMAL HIGH (ref 65–99)
Glucose-Capillary: 79 mg/dL (ref 65–99)

## 2015-05-28 LAB — URINE MICROSCOPIC-ADD ON

## 2015-05-28 LAB — TROPONIN I: Troponin I: 0.07 ng/mL — ABNORMAL HIGH (ref <0.031)

## 2015-05-28 MED ORDER — LEVETIRACETAM 100 MG/ML PO SOLN
500.0000 mg | Freq: Two times a day (BID) | ORAL | Status: DC
  Administered 2015-05-28 – 2015-06-03 (×12): 500 mg via ORAL
  Filled 2015-05-28 (×14): qty 5

## 2015-05-28 MED ORDER — DEXTROSE 50 % IV SOLN
50.0000 mL | Freq: Once | INTRAVENOUS | Status: AC
  Administered 2015-05-28: 50 mL via INTRAVENOUS

## 2015-05-28 MED ORDER — PANTOPRAZOLE SODIUM 40 MG PO TBEC
40.0000 mg | DELAYED_RELEASE_TABLET | Freq: Every morning | ORAL | Status: DC
  Administered 2015-05-28 – 2015-06-03 (×7): 40 mg via ORAL
  Filled 2015-05-28 (×7): qty 1

## 2015-05-28 MED ORDER — HEPARIN SODIUM (PORCINE) 5000 UNIT/ML IJ SOLN
5000.0000 [IU] | Freq: Three times a day (TID) | INTRAMUSCULAR | Status: DC
  Administered 2015-05-28 – 2015-06-03 (×18): 5000 [IU] via SUBCUTANEOUS
  Filled 2015-05-28 (×18): qty 1

## 2015-05-28 MED ORDER — ONDANSETRON HCL 4 MG PO TABS: 4.0000 mg | ORAL_TABLET | Freq: Four times a day (QID) | ORAL | Status: DC | PRN

## 2015-05-28 MED ORDER — ISOSORBIDE MONONITRATE ER 30 MG PO TB24
15.0000 mg | ORAL_TABLET | Freq: Every morning | ORAL | Status: DC
  Administered 2015-05-28 – 2015-06-03 (×7): 15 mg via ORAL
  Filled 2015-05-28 (×7): qty 1

## 2015-05-28 MED ORDER — INSULIN ASPART 100 UNIT/ML ~~LOC~~ SOLN
0.0000 [IU] | Freq: Three times a day (TID) | SUBCUTANEOUS | Status: DC
Start: 2015-05-28 — End: 2015-06-04
  Administered 2015-05-28: 1 [IU] via SUBCUTANEOUS
  Administered 2015-05-29: 2 [IU] via SUBCUTANEOUS
  Administered 2015-05-30: 1 [IU] via SUBCUTANEOUS
  Administered 2015-05-30: 2 [IU] via SUBCUTANEOUS
  Administered 2015-05-31 – 2015-06-01 (×4): 1 [IU] via SUBCUTANEOUS
  Administered 2015-06-02: 3 [IU] via SUBCUTANEOUS

## 2015-05-28 MED ORDER — SODIUM CHLORIDE 0.9 % IV BOLUS (SEPSIS)
1000.0000 mL | Freq: Once | INTRAVENOUS | Status: AC
  Administered 2015-05-28: 1000 mL via INTRAVENOUS

## 2015-05-28 MED ORDER — INSULIN ASPART 100 UNIT/ML ~~LOC~~ SOLN
3.0000 [IU] | Freq: Three times a day (TID) | SUBCUTANEOUS | Status: DC
  Administered 2015-05-28 – 2015-06-01 (×8): 3 [IU] via SUBCUTANEOUS

## 2015-05-28 MED ORDER — INSULIN ASPART 100 UNIT/ML ~~LOC~~ SOLN
SUBCUTANEOUS | Status: AC
  Filled 2015-05-28: qty 1

## 2015-05-28 MED ORDER — DONEPEZIL HCL 5 MG PO TABS
10.0000 mg | ORAL_TABLET | Freq: Every day | ORAL | Status: DC
  Administered 2015-05-28 – 2015-06-02 (×6): 10 mg via ORAL
  Filled 2015-05-28 (×6): qty 2

## 2015-05-28 MED ORDER — SENNOSIDES-DOCUSATE SODIUM 8.6-50 MG PO TABS: 1.0000 | ORAL_TABLET | Freq: Every evening | ORAL | Status: DC | PRN

## 2015-05-28 MED ORDER — PRAVASTATIN SODIUM 10 MG PO TABS
20.0000 mg | ORAL_TABLET | Freq: Every day | ORAL | Status: DC
  Administered 2015-05-28 – 2015-06-02 (×6): 20 mg via ORAL
  Filled 2015-05-28 (×6): qty 2

## 2015-05-28 MED ORDER — SODIUM CHLORIDE 0.9 % IV SOLN
1.0000 g | Freq: Once | INTRAVENOUS | Status: AC
  Administered 2015-05-28: 1 g via INTRAVENOUS
  Filled 2015-05-28: qty 10

## 2015-05-28 MED ORDER — ACETAMINOPHEN 325 MG PO TABS: 650.0000 mg | ORAL_TABLET | Freq: Four times a day (QID) | ORAL | Status: DC | PRN

## 2015-05-28 MED ORDER — FUROSEMIDE 10 MG/ML IJ SOLN
80.0000 mg | Freq: Two times a day (BID) | INTRAMUSCULAR | Status: DC
  Administered 2015-05-28 – 2015-05-30 (×4): 80 mg via INTRAVENOUS
  Filled 2015-05-28 (×4): qty 8

## 2015-05-28 MED ORDER — ACETAMINOPHEN 650 MG RE SUPP: 650.0000 mg | Freq: Four times a day (QID) | RECTAL | Status: DC | PRN

## 2015-05-28 MED ORDER — INSULIN ASPART 100 UNIT/ML IV SOLN
10.0000 [IU] | Freq: Once | INTRAVENOUS | Status: AC
  Administered 2015-05-28: 10 [IU] via INTRAVENOUS

## 2015-05-28 MED ORDER — INSULIN DETEMIR 100 UNIT/ML ~~LOC~~ SOLN
8.0000 [IU] | Freq: Every day | SUBCUTANEOUS | Status: DC
  Administered 2015-05-28 – 2015-05-31 (×4): 8 [IU] via SUBCUTANEOUS
  Filled 2015-05-28 (×7): qty 0.08

## 2015-05-28 MED ORDER — STERILE WATER FOR INJECTION IV SOLN
INTRAVENOUS | Status: DC
  Administered 2015-05-28 (×2): via INTRAVENOUS
  Filled 2015-05-28 (×4): qty 9.7

## 2015-05-28 MED ORDER — DEXTROSE 50 % IV SOLN
INTRAVENOUS | Status: AC
  Filled 2015-05-28: qty 50

## 2015-05-28 MED ORDER — FOLIC ACID 1 MG PO TABS
1.0000 mg | ORAL_TABLET | Freq: Every morning | ORAL | Status: DC
  Administered 2015-05-28 – 2015-06-03 (×7): 1 mg via ORAL
  Filled 2015-05-28 (×7): qty 1

## 2015-05-28 MED ORDER — CALCITRIOL 0.25 MCG PO CAPS
0.2500 ug | ORAL_CAPSULE | Freq: Every day | ORAL | Status: DC
  Administered 2015-05-28 – 2015-06-03 (×7): 0.25 ug via ORAL
  Filled 2015-05-28 (×8): qty 1

## 2015-05-28 MED ORDER — METOPROLOL SUCCINATE ER 50 MG PO TB24
50.0000 mg | ORAL_TABLET | Freq: Every day | ORAL | Status: DC
  Administered 2015-05-28 – 2015-06-03 (×7): 50 mg via ORAL
  Filled 2015-05-28 (×7): qty 1

## 2015-05-28 MED ORDER — SODIUM POLYSTYRENE SULFONATE 15 GM/60ML PO SUSP
30.0000 g | ORAL | Status: AC
  Administered 2015-05-28 (×2): 30 g via ORAL
  Filled 2015-05-28 (×2): qty 120

## 2015-05-28 MED ORDER — ONDANSETRON HCL 4 MG/2ML IJ SOLN: 4.0000 mg | Freq: Four times a day (QID) | INTRAMUSCULAR | Status: DC | PRN

## 2015-05-28 NOTE — ED Notes (Signed)
Pt brought in by daughter, from home, co 3 days of progressive weakness, poor oral intake.

## 2015-05-28 NOTE — ED Notes (Signed)
[name] in/out cath [employer] R.N. witness

## 2015-05-28 NOTE — ED Provider Notes (Signed)
CSN: [id number]     Arrival date & time 05/28/15  1107 History  This chart was scribed for Veryl Speak, MD by Evelene Croon, ED Scribe. This patient was seen in room APA10/APA10 and the patient's care was started 11:40 AM.    Chief Complaint  Patient presents with  . Fatigue    x 3 days    The history is provided by the patient and a relative (Pt's daughter). No language interpreter was used.     HPI Comments:  [name] is a 74 y.o. female with a PMHx of anemia, GI Bleed and dementia who presents to the Emergency Department with her daughter who reports generalized weakness and decreased appetite for a few days. Daughter states pt can barely walk due to weakness.  Daughter believes pt's recent stools were dark but also notes recent green stools. Pt was evaluated  by PCP ~6 days ago for "chest cold" and told she was dehydrated.Daughter also notes recent blood transfusion and h/o abdominal surgery in January 2016. Daughter denies fever and lower extremity edema. At this time pt denies pain, states she feels fine. No alleviating factors noted.   Past Medical History  Diagnosis Date  . Type 2 diabetes mellitus   . Essential hypertension, benign   . History of stroke     Previously on Coumadin  . MI, old     Reported 63  . Seizure disorder   . Coronary atherosclerosis of native coronary artery     Multivessel status post CABG 2002  . History of GI bleed     Erosive gastritis and duodenitis by EGD 2002  . Mixed hyperlipidemia   . Ischemic cardiomyopathy     LVEF 40-45% March 2013  . Dementia   . CKD (chronic kidney disease) stage 4, GFR 15-29 ml/min   . CHF (congestive heart failure)   . Chronic respiratory failure with hypoxia   . Stroke   . Seizures   . Anemia of chronic renal failure, stage 4 (severe) 01/02/2015   Past Surgical History  Procedure Laterality Date  . Coronary artery bypass graft  July 2002    LIMA to LAD, SVG to OM1, SVG to OM 2, SVG to RCA  . Tee  without cardioversion  01/28/2012    Procedure: TRANSESOPHAGEAL ECHOCARDIOGRAM (TEE);  Surgeon: Lelon Perla, MD;  Location: [facility] ENDOSCOPY;  Service: Cardiovascular;  Laterality: [name];  . Colonoscopy  2002  . Esophagogastroduodenoscopy  2002  . Esophagogastroduodenoscopy [name] 01/18/2014    MWN:UUVOZ hiatal hernia. Abnormal gastric and duodenal bulbar mucosa of uncertain significance - status post gastric bx (chronic gastritis/H.pylori +  . Colonoscopy [name] 06/25/2014    Dr. Laural Golden: tubular adenoma, pandiverticulosis.   . Agile capsule [name] 07/06/2014    Procedure: AGILE CAPSULE;  Surgeon: Danie Binder, MD;  Location: [facility] ENDO SUITE;  Service: Endoscopy;  Laterality: [name];  730  . Esophagogastroduodenoscopy [name] 07/20/2014    Dr. Gala Romney: tiny gastric polyps not manipulated. minimal pigmentation of duodenal bulb likely not significant  . Givens capsule study [name] 07/20/2014    incomplete   Family History  Problem Relation Age of Onset  . Stroke Father   . Diabetes type II Mother   . Colon cancer Neg Hx    History  Substance Use Topics  . Smoking status: Former Smoker -- 1.00 packs/day for 50 years    Types: Cigarettes    Start date: 02/22/1961    Quit date: 02/23/2012  . Smokeless tobacco: Never Used  .  Alcohol Use: No   OB History    No data available     Review of Systems  Constitutional: Positive for appetite change.  Neurological: Positive for weakness (Generalized).  All other systems reviewed and are negative.     Allergies  Review of patient's allergies indicates no known allergies.  Home Medications   Prior to Admission medications   Medication Sig Start Date End Date Taking? Authorizing Provider  calcitRIOL (ROCALTROL) 0.25 MCG capsule Take 1 capsule (0.25 mcg total) by mouth daily. 05/02/14   Eugenie Filler, MD  donepezil (ARICEPT) 10 MG tablet Take 10 mg by mouth at bedtime.  01/30/14   Historical Provider, MD  folic acid (FOLVITE) 1 MG tablet Take 1 mg by mouth every  morning.     Historical Provider, MD  hydrALAZINE (APRESOLINE) 25 MG tablet Take 75 mg by mouth every 8 (eight) hours.     Historical Provider, MD  insulin detemir (LEVEMIR) 100 UNIT/ML injection Inject 0.08 mLs (8 Units total) into the skin at bedtime. 05/02/14   Eugenie Filler, MD  isosorbide mononitrate (IMDUR) 30 MG 24 hr tablet Take 0.5 tablets (15 mg total) by mouth every morning. 01/09/15   Radene Gunning, NP  levETIRAcetam (KEPPRA) 100 MG/ML solution Take 500 mg by mouth 2 (two) times daily.  09/29/14   Historical Provider, MD  linagliptin (TRADJENTA) 5 MG TABS tablet Take 5 mg by mouth every morning.     Historical Provider, MD  Maltodextrin-Xanthan Gum (RESOURCE THICKENUP CLEAR) POWD Take 120 g by mouth as needed. Patient taking differently: Take 1 Can by mouth daily.  01/09/15   Radene Gunning, NP  metoprolol succinate (TOPROL-XL) 100 MG 24 hr tablet Take 50 mg by mouth daily. Take with or immediately following a meal.    Historical Provider, MD  mupirocin ointment (BACTROBAN) 2 % Apply 1 application topically 2 (two) times daily.  01/03/15   Historical Provider, MD  pantoprazole (PROTONIX) 40 MG tablet Take 40 mg by mouth every morning.    Historical Provider, MD  potassium chloride (K-DUR) 10 MEQ tablet Take 1 tablet (10 mEq total) by mouth daily. 02/23/15   Lendon Colonel, NP  pravastatin (PRAVACHOL) 20 MG tablet Take 20 mg by mouth at bedtime.     Historical Provider, MD  torsemide (DEMADEX) 20 MG tablet Take 3 tablets (60 mg total) by mouth daily. 03/09/15   Lendon Colonel, NP   BP 102/57 mmHg  Pulse 74  Temp(Src)   Resp 16  Ht 5\' 4"  (1.626 m)  Wt 100 lb (45.36 kg)  BMI 17.16 kg/m2  SpO2 100% Physical Exam  Constitutional: She is oriented to person, place, and time. She appears well-developed and well-nourished. No distress.  HENT:  Head: Normocephalic and atraumatic.  Mucous membranes somewhat dry   Eyes: EOM are normal.  Neck: Normal range of motion.  Cardiovascular:  Normal rate, regular rhythm and normal heart sounds.   No murmur heard. Pulmonary/Chest: Effort normal and breath sounds normal. No respiratory distress. She has no wheezes. She has no rales.  Abdominal: Soft. She exhibits no distension. There is no tenderness.  Musculoskeletal: Normal range of motion. She exhibits no edema.  Neurological: She is alert and oriented to person, place, and time. She exhibits normal muscle tone. Coordination normal.  Skin: Skin is warm and dry.  Poor turgor   Psychiatric: She has a normal mood and affect. Judgment normal.  Nursing note and vitals reviewed.   ED Course  Procedures   DIAGNOSTIC STUDIES:  Oxygen Saturation is 100% on RA, normal by my interpretation.    COORDINATION OF CARE:  11:50 AM Discussed treatment plan with pt at bedside and pt agreed to plan.  Labs Review Labs Reviewed - No data to display  Imaging Review No results found.   EKG Interpretation   Date/Time:  Monday May 28 2015 11:51:38 EDT Ventricular Rate:  65 PR Interval:  203 QRS Duration: 105 QT Interval:  471 QTC Calculation: 490 R Axis:   32 Text Interpretation:  Sinus rhythm Ventricular premature complex Low  voltage, extremity leads Repol abnrm suggests ischemia, anterolateral  Confirmed by Kysa Calais  MD, Melodie Ashworth ([id number]) on 05/28/2015 12:05:04 PM      MDM   Final diagnoses:  None    Patient is a 74 year old female with extensive past medical history. She presents for evaluation of weakness. Workup today reveals worsening renal failure with a BUN of 222, creatinine of 7.2, potassium of 7.4 and CO2 of 10. I have discussed these findings with Dr. Jerilee Hoh from the hospitalist service who agrees to admit. She is recommending ice with nephrology to determine whether this patient will require emergent dialysis. She was given insulin and glucose as well as calcium to treat her hyperkalemia. There are no EKG changes and she otherwise appears hemodynamically  stable.  CRITICAL CARE Performed by: Veryl Speak Total critical care time: 45 minutes Critical care time was exclusive of separately billable procedures and treating other patients. Critical care was necessary to treat or prevent imminent or life-threatening deterioration. Critical care was time spent personally by me on the following activities: development of treatment plan with patient and/or surrogate as well as nursing, discussions with consultants, evaluation of patient's response to treatment, examination of patient, obtaining history from patient or surrogate, ordering and performing treatments and interventions, ordering and review of laboratory studies, ordering and review of radiographic studies, pulse oximetry and re-evaluation of patient's condition.   I personally performed the services described in this documentation, which was scribed in my presence. The recorded information has been reviewed and is accurate.        Veryl Speak, MD 05/28/15 1257

## 2015-05-28 NOTE — ED Notes (Signed)
CRITICAL VALUE ALERT  Critical value received:  k 7.4  Date of notification:  05/28/2015  Time of notification:  6213  Critical value read back:Yes.    Nurse who received alert:  Iona Coach  MD notified (1st page):  [place]   Time of first page:  1232    Time MD responded:  1232

## 2015-05-28 NOTE — ED Notes (Signed)
Report given to [place], RN for room ICU-06.

## 2015-05-28 NOTE — H&P (Addendum)
[facility]          History and Physical    PCP:   [name], MD   EDP: Veryl Speak, EDP  Chief Complaint:  Weakness, fatigue, gait imbalance  HPI: Patient is a 74 year old woman known for a recent hospitalization with hyperkalemia. Past medical history significant for chronic kidney disease stage IV, diabetes, hypertension, prior history of CVA, ischemic cardiomyopathy among other things who presents to the hospital today with the above-mentioned complaints. Although she is able to answer short and direct questions, she is unable to provide full history and hence it is obtained from her daughter [name] at bedside. [name] states that about 3 or 4 days ago she started having issues with walking where she would be very weak and had difficulty standing up and felt unbalanced while ambulating. She also noticed that she has a blank gaze ;will be talking and in the middle of a sentence her eyes will glaze over. She has not had any nausea or vomiting, no dark stools, no chest pain, no shortness of breath. She was brought to the emergency department today where labs are mostly significant for a creatinine of 7.16, a BUN of 222, a potassium of 7.4. I have independently reviewed her EKG which shows normal sinus rhythm decreased voltage, occasional PVC no signs of ischemia, no peaked T waves. We have been asked to admit her for further evaluation and management.  Allergies:  No Known Allergies    Past Medical History  Diagnosis Date  . Type 2 diabetes mellitus   . Essential hypertension, benign   . History of stroke     Previously on Coumadin  . MI, old     Reported 46  . Seizure disorder   . Coronary atherosclerosis of native coronary artery     Multivessel status post CABG 2002  . History of GI bleed     Erosive gastritis and duodenitis by EGD 2002  . Mixed hyperlipidemia   . Ischemic cardiomyopathy     LVEF 40-45% March 2013  . Dementia   . CKD (chronic  kidney disease) stage 4, GFR 15-29 ml/min   . CHF (congestive heart failure)   . Chronic respiratory failure with hypoxia   . Stroke   . Seizures   . Anemia of chronic renal failure, stage 4 (severe) 01/02/2015    Past Surgical History  Procedure Laterality Date  . Coronary artery bypass graft  July 2002    LIMA to LAD, SVG to OM1, SVG to OM 2, SVG to RCA  . Tee without cardioversion  01/28/2012    Procedure: TRANSESOPHAGEAL ECHOCARDIOGRAM (TEE);  Surgeon: Lelon Perla, MD;  Location: [facility] ENDOSCOPY;  Service: Cardiovascular;  Laterality: [name];  . Colonoscopy  2002  . Esophagogastroduodenoscopy  2002  . Esophagogastroduodenoscopy [name] 01/18/2014    XTK:[id number] hiatal hernia. Abnormal gastric and duodenal bulbar mucosa of uncertain significance - status post gastric bx (chronic gastritis/H.pylori +  . Colonoscopy [name] 06/25/2014    Dr. Laural Golden: tubular adenoma, pandiverticulosis.   . Agile capsule [name] 07/06/2014    Procedure: AGILE CAPSULE;  Surgeon: Danie Binder, MD;  Location: [facility] ENDO SUITE;  Service: Endoscopy;  Laterality: [name];  730  . Esophagogastroduodenoscopy [name] 07/20/2014    Dr. Gala Romney: tiny gastric polyps not manipulated. minimal pigmentation of duodenal bulb likely not significant  . Givens capsule study [name] 07/20/2014    incomplete    Prior  to Admission medications   Medication Sig Start Date End Date Taking? Authorizing Provider  calcitRIOL (ROCALTROL) 0.25 MCG capsule Take 1 capsule (0.25 mcg total) by mouth daily. 05/02/14  Yes Eugenie Filler, MD  donepezil (ARICEPT) 10 MG tablet Take 10 mg by mouth at bedtime.  01/30/14  Yes Historical Provider, MD  folic acid (FOLVITE) 1 MG tablet Take 1 mg by mouth every morning.    Yes Historical Provider, MD  hydrALAZINE (APRESOLINE) 25 MG tablet Take 75 mg by mouth every 8 (eight) hours.    Yes Historical Provider, MD  insulin detemir (LEVEMIR) 100 UNIT/ML injection Inject 0.08 mLs (8 Units total) into the skin at bedtime. 05/02/14  Yes Eugenie Filler, MD  isosorbide mononitrate (IMDUR) 30 MG 24 hr tablet Take 0.5 tablets (15 mg total) by mouth every morning. 01/09/15  Yes Lezlie Octave Black, NP  levETIRAcetam (KEPPRA) 100 MG/ML solution Take 500 mg by mouth 2 (two) times daily.  09/29/14  Yes Historical Provider, MD  linagliptin (TRADJENTA) 5 MG TABS tablet Take 5 mg by mouth every morning.    Yes Historical Provider, MD  Maltodextrin-Xanthan Gum (RESOURCE THICKENUP CLEAR) POWD Take 120 g by mouth as needed. Patient taking differently: Take 1 Can by mouth daily.  01/09/15  Yes Lezlie Octave Black, NP  metoprolol succinate (TOPROL-XL) 100 MG 24 hr tablet Take 50 mg by mouth daily. Take with or immediately following a meal.   Yes Historical Provider, MD  mupirocin ointment (BACTROBAN) 2 % Apply 1 application topically 2 (two) times daily.  01/03/15  Yes Historical Provider, MD  pantoprazole (PROTONIX) 40 MG tablet Take 40 mg by mouth every morning.   Yes Historical Provider, MD  potassium chloride (K-DUR) 10 MEQ tablet Take 1 tablet (10 mEq total) by mouth daily. Patient taking differently: Take 20 mEq by mouth daily.  02/23/15  Yes Lendon Colonel, NP  pravastatin (PRAVACHOL) 20 MG tablet Take 20 mg by mouth at bedtime.    Yes Historical Provider, MD  torsemide (DEMADEX) 20 MG tablet Take 3 tablets (60 mg total) by mouth daily. 03/09/15  Yes Lendon Colonel, NP    Social History:  reports that she quit smoking about 3 years ago. Her smoking use included Cigarettes. She started smoking about 54 years ago. She has a 50 pack-year smoking history. She has never used smokeless tobacco. She reports that she does not drink alcohol or use illicit drugs.  Family History  Problem Relation Age of Onset  . Stroke Father   . Diabetes type II Mother   . Colon cancer Neg Hx     Review of Systems:  Unable to obtain given current mental state  Physical Exam: Blood pressure 118/60, pulse 75, temperature 97.2 F (36.2 C), temperature source Oral, resp.  rate 18, height 5' 4"  (1.626 m), weight 45.36 kg (100 lb), SpO2 100 %. General: Awake, drowsy, can answer simple questions HEENT: Normocephalic, atraumatic, pupils equal round and reactive to light, extraocular movements intact, dry mucous membranes Neck: Supple, no JVD, no lymphadenopathy, no bruits, no goiter. Cardiovascular: Regular rate and rhythm, no murmurs, rubs or gallops Lungs: Clear to auscultation bilaterally Abdomen: Soft, nontender, nondistended, positive bowel sounds, no masses or organomegaly noted. Exam extremities: No clubbing, cyanosis or edema, positive pulses. Neurologic: Moves all 4 spontaneously  Labs on Admission:  Results for orders placed or performed during the hospital encounter of 05/28/15 (from the past 48 hour(s))  Brain natriuretic peptide     Status:  Abnormal   Collection Time: 05/28/15 11:48 AM  Result Value Ref Range   B Natriuretic Peptide 1084.0 (H) 0.0 - 100.0 pg/mL  CBC with Differential     Status: Abnormal   Collection Time: 05/28/15 11:49 AM  Result Value Ref Range   WBC 9.3 4.0 - 10.5 K/uL   RBC 2.87 (L) 3.87 - 5.11 MIL/uL   Hemoglobin 7.9 (L) 12.0 - 15.0 g/dL   HCT 26.3 (L) 36.0 - 46.0 %   MCV 91.6 78.0 - 100.0 fL   MCH 27.5 26.0 - 34.0 pg   MCHC 30.0 30.0 - 36.0 g/dL   RDW 18.4 (H) 11.5 - 15.5 %   Platelets 288 150 - 400 K/uL   Neutrophils Relative % 90 (H) 43 - 77 %   Neutro Abs 8.3 (H) 1.7 - 7.7 K/uL   Lymphocytes Relative 5 (L) 12 - 46 %   Lymphs Abs 0.5 (L) 0.7 - 4.0 K/uL   Monocytes Relative 4 3 - 12 %   Monocytes Absolute 0.3 0.1 - 1.0 K/uL   Eosinophils Relative 1 0 - 5 %   Eosinophils Absolute 0.1 0.0 - 0.7 K/uL   Basophils Relative 0 0 - 1 %   Basophils Absolute 0.0 0.0 - 0.1 K/uL  Comprehensive metabolic panel     Status: Abnormal   Collection Time: 05/28/15 11:49 AM  Result Value Ref Range   Sodium 142 135 - 145 mmol/L   Potassium 7.4 (HH) 3.5 - 5.1 mmol/L    Comment: CRITICAL RESULT CALLED TO, READ BACK BY AND VERIFIED  WITH: WILSON,S AT 12:30PM ON 05/28/15 BY FESTERMAN,C    Chloride 122 (H) 101 - 111 mmol/L   CO2 10 (L) 22 - 32 mmol/L   Glucose, Bld 214 (H) 65 - 99 mg/dL   BUN 222 (H) 6 - 20 mg/dL    Comment: RESULTS CONFIRMED BY MANUAL DILUTION   Creatinine, Ser 7.16 (H) 0.44 - 1.00 mg/dL   Calcium 8.6 (L) 8.9 - 10.3 mg/dL   Total Protein 9.5 (H) 6.5 - 8.1 g/dL   Albumin 3.2 (L) 3.5 - 5.0 g/dL   AST 22 15 - 41 U/L   ALT 17 14 - 54 U/L   Alkaline Phosphatase 119 38 - 126 U/L   Total Bilirubin 0.5 0.3 - 1.2 mg/dL   GFR calc non Af Amer 5 (L) >60 mL/min   GFR calc Af Amer 6 (L) >60 mL/min    Comment: (NOTE) The eGFR has been calculated using the CKD EPI equation. This calculation has not been validated in all clinical situations. eGFR's persistently <60 mL/min signify possible Chronic Kidney Disease.    Anion gap 10 5 - 15  Troponin I     Status: Abnormal   Collection Time: 05/28/15 11:49 AM  Result Value Ref Range   Troponin I 0.07 (H) <0.031 ng/mL    Comment:        PERSISTENTLY INCREASED TROPONIN VALUES IN THE RANGE OF 0.04-0.49 ng/mL CAN BE SEEN IN:       -UNSTABLE ANGINA       -CONGESTIVE HEART FAILURE       -MYOCARDITIS       -CHEST TRAUMA       -ARRYHTHMIAS       -LATE PRESENTING MYOCARDIAL INFARCTION       -COPD   CLINICAL FOLLOW-UP RECOMMENDED.   Urinalysis, Routine w reflex microscopic (not at [facility])     Status: Abnormal   Collection Time: 05/28/15 12:00 PM  Result Value Ref Range   Color, Urine YELLOW YELLOW   APPearance HAZY (A) CLEAR   Specific Gravity, Urine 1.020 1.005 - 1.030   pH 5.5 5.0 - 8.0   Glucose, UA NEGATIVE NEGATIVE mg/dL   Hgb urine dipstick NEGATIVE NEGATIVE   Bilirubin Urine NEGATIVE NEGATIVE   Ketones, ur NEGATIVE NEGATIVE mg/dL   Protein, ur TRACE (A) NEGATIVE mg/dL   Urobilinogen, UA 0.2 0.0 - 1.0 mg/dL   Nitrite NEGATIVE NEGATIVE   Leukocytes, UA NEGATIVE NEGATIVE  Urine microscopic-add on     Status: Abnormal   Collection Time: 05/28/15 12:00  PM  Result Value Ref Range   Squamous Epithelial / LPF FEW (A) RARE   RBC / HPF 3-6 <3 RBC/hpf   Bacteria, UA MANY (A) RARE    Radiological Exams on Admission: Dg Chest Port 1 View  05/28/2015   CLINICAL DATA:  Weakness and cough for 3 days, low rectal temperature  EXAM: PORTABLE CHEST - 1 VIEW  COMPARISON:  Portable exam 1203 hours compared to 04/25/2015  FINDINGS: Enlargement of cardiac silhouette post CABG.  Mediastinal contours and pulmonary vascularity normal.  Atherosclerotic calcification aorta.  Lungs clear.  No pleural effusion or pneumothorax.  IMPRESSION: Enlargement of cardiac silhouette post CABG.  No acute abnormalities.   Electronically Signed   By: Lavonia Dana M.D.   On: 05/28/2015 12:18    Assessment/Plan Principal Problem:   Uremia Active Problems:   Diabetes mellitus   Elevated troponin   Hyperkalemia   Acute on chronic renal failure   CKD stage 4 due to type 2 diabetes mellitus   Normocytic anemia   Hyperkalemia -Due to worsening renal failure. -Has been given insulin, Kayexalate and calcium gluconate in the emergency department. -We will recheck potassium to assess levels and see if further treatment is required. -If necessary, we'll repeat Kayexalate doses as well as insulin dosing.  Acute on chronic kidney disease stage IV -Symptoms are likely consistent with uremia. -Patient has already been assessed by nephrology, Dr. Hinda Lenis. -Discussed with daughter [name] at bedside. They do not want to do dialysis at this time. They would rather try conservative measures and then consider dialysis if needed. Discussed with [name] that if potassium does not decrease with conservative measures, lack of dialysis would likely mean death. She understands. -Lasix and fluids with bicarbonate have been ordered by nephrology. Burnis Medin recheck renal function and potassium levels later this evening and in the morning.  Elevated troponin -Likely related to severe renal  insufficiency, no chest pain or shortness of breath to indicate acute coronary syndrome. -No further cardiac workup anticipated.  Normocytic anemia -Secondary to chronic disease from her chronic kidney disease. -Hemoglobin is currently 7.9, no need for transfusion unless hemoglobin were to drop below 7.  Diabetes mellitus -Check hemoglobin A1c, continue Lantus 8 units and start on a sensitive sliding scale.  DVT prophylaxis -Subcutaneous heparin.  CODE STATUS -Daughter wishes her to remain full code at this time, we will continue conversations regarding this matter.    Time Spent on Admission: 80 minutes  HERNANDEZ ACOSTA,ESTELA [facility] Pager: [phone] 05/28/2015, 3:23 PM

## 2015-05-28 NOTE — Consult Note (Signed)
Reason for Consult: Acute kidney injury superimposed on chronic Referring Physician: Dr. Brayton Caves [name] is an 74 y.o. female.  HPI: She is a patient who has history of diabetes, hypertension, seizure disorder, CVA and chronic renal failure stage IV presently was brought by her daughter because of poor appetite, weakness and unable to walk. Patient presently denies any nausea or vomiting. There is no history of also diarrhea. Patient was discharged recently from hospital after she was admitted because of worsening of renal failure and hyperkalemia. Patient presently denies any difficulty in breathing.  Past Medical History  Diagnosis Date  . Type 2 diabetes mellitus   . Essential hypertension, benign   . History of stroke     Previously on Coumadin  . MI, old     Reported 36  . Seizure disorder   . Coronary atherosclerosis of native coronary artery     Multivessel status post CABG 2002  . History of GI bleed     Erosive gastritis and duodenitis by EGD 2002  . Mixed hyperlipidemia   . Ischemic cardiomyopathy     LVEF 40-45% March 2013  . Dementia   . CKD (chronic kidney disease) stage 4, GFR 15-29 ml/min   . CHF (congestive heart failure)   . Chronic respiratory failure with hypoxia   . Stroke   . Seizures   . Anemia of chronic renal failure, stage 4 (severe) 01/02/2015    Past Surgical History  Procedure Laterality Date  . Coronary artery bypass graft  July 2002    LIMA to LAD, SVG to OM1, SVG to OM 2, SVG to RCA  . Tee without cardioversion  01/28/2012    Procedure: TRANSESOPHAGEAL ECHOCARDIOGRAM (TEE);  Surgeon: Lelon Perla, MD;  Location: [facility] ENDOSCOPY;  Service: Cardiovascular;  Laterality: [name];  . Colonoscopy  2002  . Esophagogastroduodenoscopy  2002  . Esophagogastroduodenoscopy [name] 01/18/2014    UQJ:[id number] hiatal hernia. Abnormal gastric and duodenal bulbar mucosa of uncertain significance - status post gastric bx (chronic gastritis/H.pylori +  . Colonoscopy  [name] 06/25/2014    Dr. Laural Golden: tubular adenoma, pandiverticulosis.   . Agile capsule [name] 07/06/2014    Procedure: AGILE CAPSULE;  Surgeon: Danie Binder, MD;  Location: [facility] ENDO SUITE;  Service: Endoscopy;  Laterality: [name];  730  . Esophagogastroduodenoscopy [name] 07/20/2014    Dr. Gala Romney: tiny gastric polyps not manipulated. minimal pigmentation of duodenal bulb likely not significant  . Givens capsule study [name] 07/20/2014    incomplete    Family History  Problem Relation Age of Onset  . Stroke Father   . Diabetes type II Mother   . Colon cancer Neg Hx     Social History:  reports that she quit smoking about 3 years ago. Her smoking use included Cigarettes. She started smoking about 54 years ago. She has a 50 pack-year smoking history. She has never used smokeless tobacco. She reports that she does not drink alcohol or use illicit drugs.  Allergies: No Known Allergies  Medications: I have reviewed the patient's current medications.  Results for orders placed or performed during the hospital encounter of 05/28/15 (from the past 48 hour(s))  Brain natriuretic peptide     Status: Abnormal   Collection Time: 05/28/15 11:48 AM  Result Value Ref Range   B Natriuretic Peptide 1084.0 (H) 0.0 - 100.0 pg/mL  CBC with Differential     Status: Abnormal   Collection Time: 05/28/15 11:49 AM  Result Value Ref Range   WBC  9.3 4.0 - 10.5 K/uL   RBC 2.87 (L) 3.87 - 5.11 MIL/uL   Hemoglobin 7.9 (L) 12.0 - 15.0 g/dL   HCT 26.3 (L) 36.0 - 46.0 %   MCV 91.6 78.0 - 100.0 fL   MCH 27.5 26.0 - 34.0 pg   MCHC 30.0 30.0 - 36.0 g/dL   RDW 18.4 (H) 11.5 - 15.5 %   Platelets 288 150 - 400 K/uL   Neutrophils Relative % 90 (H) 43 - 77 %   Neutro Abs 8.3 (H) 1.7 - 7.7 K/uL   Lymphocytes Relative 5 (L) 12 - 46 %   Lymphs Abs 0.5 (L) 0.7 - 4.0 K/uL   Monocytes Relative 4 3 - 12 %   Monocytes Absolute 0.3 0.1 - 1.0 K/uL   Eosinophils Relative 1 0 - 5 %   Eosinophils Absolute 0.1 0.0 - 0.7 K/uL   Basophils  Relative 0 0 - 1 %   Basophils Absolute 0.0 0.0 - 0.1 K/uL  Comprehensive metabolic panel     Status: Abnormal   Collection Time: 05/28/15 11:49 AM  Result Value Ref Range   Sodium 142 135 - 145 mmol/L   Potassium 7.4 (HH) 3.5 - 5.1 mmol/L    Comment: CRITICAL RESULT CALLED TO, READ BACK BY AND VERIFIED WITH: WILSON,S AT 12:30PM ON 05/28/15 BY FESTERMAN,C    Chloride 122 (H) 101 - 111 mmol/L   CO2 10 (L) 22 - 32 mmol/L   Glucose, Bld 214 (H) 65 - 99 mg/dL   BUN 222 (H) 6 - 20 mg/dL    Comment: RESULTS CONFIRMED BY MANUAL DILUTION   Creatinine, Ser 7.16 (H) 0.44 - 1.00 mg/dL   Calcium 8.6 (L) 8.9 - 10.3 mg/dL   Total Protein 9.5 (H) 6.5 - 8.1 g/dL   Albumin 3.2 (L) 3.5 - 5.0 g/dL   AST 22 15 - 41 U/L   ALT 17 14 - 54 U/L   Alkaline Phosphatase 119 38 - 126 U/L   Total Bilirubin 0.5 0.3 - 1.2 mg/dL   GFR calc non Af Amer 5 (L) >60 mL/min   GFR calc Af Amer 6 (L) >60 mL/min    Comment: (NOTE) The eGFR has been calculated using the CKD EPI equation. This calculation has not been validated in all clinical situations. eGFR's persistently <60 mL/min signify possible Chronic Kidney Disease.    Anion gap 10 5 - 15  Troponin I     Status: Abnormal   Collection Time: 05/28/15 11:49 AM  Result Value Ref Range   Troponin I 0.07 (H) <0.031 ng/mL    Comment:        PERSISTENTLY INCREASED TROPONIN VALUES IN THE RANGE OF 0.04-0.49 ng/mL CAN BE SEEN IN:       -UNSTABLE ANGINA       -CONGESTIVE HEART FAILURE       -MYOCARDITIS       -CHEST TRAUMA       -ARRYHTHMIAS       -LATE PRESENTING MYOCARDIAL INFARCTION       -COPD   CLINICAL FOLLOW-UP RECOMMENDED.   Urinalysis, Routine w reflex microscopic (not at [facility])     Status: Abnormal   Collection Time: 05/28/15 12:00 PM  Result Value Ref Range   Color, Urine YELLOW YELLOW   APPearance HAZY (A) CLEAR   Specific Gravity, Urine 1.020 1.005 - 1.030   pH 5.5 5.0 - 8.0   Glucose, UA NEGATIVE NEGATIVE mg/dL   Hgb urine dipstick NEGATIVE  NEGATIVE  Bilirubin Urine NEGATIVE NEGATIVE   Ketones, ur NEGATIVE NEGATIVE mg/dL   Protein, ur TRACE (A) NEGATIVE mg/dL   Urobilinogen, UA 0.2 0.0 - 1.0 mg/dL   Nitrite NEGATIVE NEGATIVE   Leukocytes, UA NEGATIVE NEGATIVE  Urine microscopic-add on     Status: Abnormal   Collection Time: 05/28/15 12:00 PM  Result Value Ref Range   Squamous Epithelial / LPF FEW (A) RARE   RBC / HPF 3-6 <3 RBC/hpf   Bacteria, UA MANY (A) RARE    Dg Chest Port 1 View  05/28/2015   CLINICAL DATA:  Weakness and cough for 3 days, low rectal temperature  EXAM: PORTABLE CHEST - 1 VIEW  COMPARISON:  Portable exam 1203 hours compared to 04/25/2015  FINDINGS: Enlargement of cardiac silhouette post CABG.  Mediastinal contours and pulmonary vascularity normal.  Atherosclerotic calcification aorta.  Lungs clear.  No pleural effusion or pneumothorax.  IMPRESSION: Enlargement of cardiac silhouette post CABG.  No acute abnormalities.   Electronically Signed   By: Lavonia Dana M.D.   On: 05/28/2015 12:18    Review of Systems  Respiratory: Negative for shortness of breath.   Cardiovascular: Negative for orthopnea.  Gastrointestinal: Negative for nausea, vomiting and diarrhea.  Neurological: Positive for weakness.   Blood pressure 116/55, pulse 65, temperature 94.3 F (34.6 C), temperature source Rectal, resp. rate 16, height 5' 4"  (1.626 m), weight 100 lb (45.36 kg), SpO2 100 %. Physical Exam  Constitutional: No distress.  Eyes: Left eye exhibits no discharge.  Neck: JVD present.  Cardiovascular: Normal rate.   No murmur heard. Respiratory: No respiratory distress. She has no wheezes.  GI: She exhibits no distension. There is no tenderness.  Musculoskeletal: She exhibits edema.    Assessment/Plan:  problem #1 hyperkalemia : her potassium has increased significantly. Patient with previous history of  Hyperkalemia. 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 Etiology was thought to be secondary to diabetes /hypertension /recurrent  AKI / age-related.  Except poor appetite patient doesn't seem to have any other sign and symptom of uremia.   problem #4 diabetes   problem  #5 hypertension : Her blood pressure is reasonably controlled   problem  #6 anemia : possibly combination of anemia of iron deficiency and chronic renal failure. Presently her daughter says that she has dark colored stool as possible GI bleeding    problem #7 metabolic acidosis   plan: 1] discussed with her daughter about doing dialysis presently Presently she does not want to consider dialysis  And  wants her to be treated conservatively and if no improvement then she'll consider dialysis.  2] We will start patient on 1/4 D5w with 100 meq of sodium bicarbonate at 135 cc/hr 3] we will start patient on Lasix 80 mg iv bid 4]BMP at 7 pm today   [facility] 05/28/2015, 1:27 PM

## 2015-05-29 ENCOUNTER — Other Ambulatory Visit (HOSPITAL_COMMUNITY): Payer: Medicare Other

## 2015-05-29 ENCOUNTER — Encounter (HOSPITAL_COMMUNITY): Payer: Medicare Other

## 2015-05-29 LAB — COMPREHENSIVE METABOLIC PANEL
ALBUMIN: 2.8 g/dL — AB (ref 3.5–5.0)
ALK PHOS: 97 U/L (ref 38–126)
ALT: 17 U/L (ref 14–54)
AST: 28 U/L (ref 15–41)
Anion gap: 14 (ref 5–15)
BILIRUBIN TOTAL: 0.4 mg/dL (ref 0.3–1.2)
BUN: 162 mg/dL — AB (ref 6–20)
CHLORIDE: 124 mmol/L — AB (ref 101–111)
CO2: 11 mmol/L — ABNORMAL LOW (ref 22–32)
Calcium: 7.9 mg/dL — ABNORMAL LOW (ref 8.9–10.3)
Creatinine, Ser: 6.04 mg/dL — ABNORMAL HIGH (ref 0.44–1.00)
GFR, EST AFRICAN AMERICAN: 7 mL/min — AB (ref >60)
GFR, EST NON AFRICAN AMERICAN: 6 mL/min — AB (ref >60)
GLUCOSE: 100 mg/dL — AB (ref 65–99)
POTASSIUM: 4.5 mmol/L (ref 3.5–5.1)
Sodium: 149 mmol/L — ABNORMAL HIGH (ref 135–145)
Total Protein: 8 g/dL (ref 6.5–8.1)

## 2015-05-29 LAB — GLUCOSE, CAPILLARY
GLUCOSE-CAPILLARY: 96 mg/dL (ref 65–99)
Glucose-Capillary: 176 mg/dL — ABNORMAL HIGH (ref 65–99)
Glucose-Capillary: 136 mg/dL — ABNORMAL HIGH (ref 65–99)

## 2015-05-29 LAB — MRSA PCR SCREENING: MRSA BY PCR: NEGATIVE

## 2015-05-29 LAB — CLOSTRIDIUM DIFFICILE BY PCR: Toxigenic C. Difficile by PCR: POSITIVE — AB

## 2015-05-29 MED ORDER — STARCH (THICKENING) PO POWD
ORAL | Status: DC | PRN
  Filled 2015-05-29 (×2): qty 227

## 2015-05-29 MED ORDER — SODIUM BICARBONATE 8.4 % IV SOLN
INTRAVENOUS | Status: DC
  Administered 2015-05-29 – 2015-05-30 (×3): via INTRAVENOUS
  Filled 2015-05-29 (×8): qty 100

## 2015-05-29 MED ORDER — SODIUM BICARBONATE 8.4 % IV SOLN
INTRAVENOUS | Status: DC
  Filled 2015-05-29 (×5): qty 100

## 2015-05-29 MED ORDER — METRONIDAZOLE 500 MG PO TABS: 500.0000 mg | ORAL_TABLET | Freq: Three times a day (TID) | ORAL | Status: DC

## 2015-05-29 MED ORDER — METRONIDAZOLE IN NACL 5-0.79 MG/ML-% IV SOLN
500.0000 mg | Freq: Three times a day (TID) | INTRAVENOUS | Status: DC
  Administered 2015-05-29: 500 mg via INTRAVENOUS
  Filled 2015-05-29: qty 100

## 2015-05-29 MED ORDER — HYDRALAZINE HCL 25 MG PO TABS
75.0000 mg | ORAL_TABLET | Freq: Three times a day (TID) | ORAL | Status: DC
  Administered 2015-05-29 – 2015-06-03 (×16): 75 mg via ORAL
  Filled 2015-05-29 (×16): qty 3

## 2015-05-29 MED ORDER — CETYLPYRIDINIUM CHLORIDE 0.05 % MT LIQD
7.0000 mL | Freq: Two times a day (BID) | OROMUCOSAL | Status: DC
  Administered 2015-05-30 – 2015-06-03 (×8): 7 mL via OROMUCOSAL

## 2015-05-29 MED ORDER — METRONIDAZOLE IN NACL 5-0.79 MG/ML-% IV SOLN: 500.0000 mg | Freq: Three times a day (TID) | INTRAVENOUS | Status: DC

## 2015-05-29 NOTE — Progress Notes (Signed)
[facility] PROGRESS NOTE  [name] TML:[id number] DOB: January 06, 1941 DOA: 05/28/2015 PCP: [name], MD   Interim progress note Patient was admitted to the hospital on 7/4 due to gait imbalance and some altered mental status. She has stage IV chronic kidney disease and was thought to have uremia given creatinine greater than 7 and BUN of 222. She was also hyperkalemic with a potassium of 7.4. Patient's daughter refuses dialysis and as such we have been treating her conservatively. She received insulin and Kayexalate for her potassium which is down to 4.5 on 7/5. Continue to treat metabolic derangements due to advanced renal failure. Nephrology is involved. Because of Kayexalate-induced diarrhea, C. difficile PCR was ordered and has resulted positive. Discussed with infectious diseases the fact that I do not believe this is an acute infection. We have decided to not treat at this time.  Assessment/Plan: Hyperkalemia -Potassium down to 4.5 today after insulin and Kayexalate administered yesterday. -Continue to monitor.  Acute on chronic kidney disease stage IV -Creatinine is improving. -Acute component likely related to prerenal azotemia/ATN. -Appreciate nephrology input and recommendations. -Given hypernatremia, plan to change IV fluids to D5W with sodium bicarbonate.  Metabolic acidosis -Secondary to advanced renal failure. -Bicarbonate up to 11 today, continue bicarbonate 90 fluids as per nephrology recommendations.  Hypernatremia -Likely secondary to lack of free water, to receive D5W.  Questionable C. difficile colitis -Patient has had no symptoms of C. difficile including diarrhea or abdominal pain. -Patient received Kayexalate overnight for treatment of her hyperkalemia which resulted in expected diarrhea. I am assuming because of diarrhea C. difficile PCR was checked overnight and has resulted positive. -I have discussed this case with infectious diseases, Dr.  Graylon Good. We presume that this patient is colonized with C. difficile and has diarrhea because of Kayexalate, continue isolation but does not need treatment at this time.  Normocytic anemia -Secondary to chronic disease from chronic renal failure. -Hemoglobin on admission 7.9, no indication for acute transfusion. -Recheck hemoglobin in the morning.  Elevated troponin -Likely secondary to severe renal insufficiency. No chest pain or shortness of breath to indicate acute coronary syndrome. -No further cardiac workup anticipated.  Diabetes mellitus -Fair control, continue current regimen  Code Status: Remains full code; need to have further discussions with family regarding CODE STATUS. Family Communication: Daughter and sister at bedside updated on plan of care  Disposition Plan: Transfer to floor today, general disposition to be determined based upon medical progress   Consultants:  Nephrology, Dr. Hinda Lenis   Antibiotics:  None   Subjective: Able to answer simple questions, not extremely verbal, swallowing medications without issues, denies any chest discomfort or shortness of breath.  Objective: Filed Vitals:   05/29/15 0800 05/29/15 0900 05/29/15 1000 05/29/15 1126  BP: 153/98 147/91    Pulse: 89 92    Temp:   98.4 F (36.9 C) 97.8 F (36.6 C)  TempSrc:   Oral Oral  Resp: 15 18    Height:      Weight:      SpO2: 100% 87%      Intake/Output Summary (Last 24 hours) at 05/29/15 1225 Last data filed at 05/29/15 1200  Gross per 24 hour  Intake   3450 ml  Output   1400 ml  Net   2050 ml   Filed Weights   05/28/15 1119 05/28/15 1455 05/29/15 0500  Weight: 45.36 kg (100 lb) 47.3 kg (104 lb 4.4 oz) 50.7 kg (111 lb 12.4 oz)  Exam:   General:  Awake, alert  Cardiovascular: Regular rate and rhythm  Respiratory: Clear to auscultation bilaterally  Abdomen: Soft, nontender, nondistended, positive bowel sounds  Extremities: No clubbing, cyanosis or edema    Neurologic:  Moves all 4 spontaneously  Data Reviewed: Basic Metabolic Panel:  Recent Labs Lab 05/28/15 1149 05/28/15 1534 05/29/15 0543  NA 142 145 149*  K 7.4* 6.6* 4.5  CL 122* 126* 124*  CO2 10* 9* 11*  GLUCOSE 214* 175* 100*  BUN 222* 180* 162*  CREATININE 7.16* 6.75* 6.04*  CALCIUM 8.6* 8.7* 7.9*   Liver Function Tests:  Recent Labs Lab 05/28/15 1149 05/29/15 0543  AST 22 28  ALT 17 17  ALKPHOS 119 97  BILITOT 0.5 0.4  PROT 9.5* 8.0  ALBUMIN 3.2* 2.8*   No results for input(s): LIPASE, AMYLASE in the last 168 hours. No results for input(s): AMMONIA in the last 168 hours. CBC:  Recent Labs Lab 05/28/15 1149  WBC 9.3  NEUTROABS 8.3*  HGB 7.9*  HCT 26.3*  MCV 91.6  PLT 288   Cardiac Enzymes:  Recent Labs Lab 05/28/15 1149  TROPONINI 0.07*   BNP (last 3 results)  Recent Labs  01/02/15 1125 05/28/15 1148  BNP >4500.0* 1084.0*    ProBNP (last 3 results)  Recent Labs  06/09/14 1021  PROBNP 30358.0*    CBG:  Recent Labs Lab 05/28/15 1640 05/28/15 2124 05/29/15 0723 05/29/15 1123  GLUCAP 146* 79 96 176*    Recent Results (from the past 240 hour(s))  MRSA PCR Screening     Status: None   Collection Time: 05/28/15  2:35 PM  Result Value Ref Range Status   MRSA by PCR NEGATIVE NEGATIVE Final    Comment:        The GeneXpert MRSA Assay (FDA approved for NASAL specimens only), is one component of a comprehensive MRSA colonization surveillance program. It is not intended to diagnose MRSA infection nor to guide or monitor treatment for MRSA infections.   Clostridium Difficile by PCR (not at [facility])     Status: Abnormal   Collection Time: 05/28/15  9:34 PM  Result Value Ref Range Status   C difficile by pcr POSITIVE (A) NEGATIVE Final    Comment: CRITICAL RESULT CALLED TO, READ BACK BY AND VERIFIED WITH: [name] [id number] ON [id number] BY FORSYTH K      Studies: Dg Chest Port 1 View  05/28/2015   CLINICAL DATA:  Weakness and  cough for 3 days, low rectal temperature  EXAM: PORTABLE CHEST - 1 VIEW  COMPARISON:  Portable exam 1203 hours compared to 04/25/2015  FINDINGS: Enlargement of cardiac silhouette post CABG.  Mediastinal contours and pulmonary vascularity normal.  Atherosclerotic calcification aorta.  Lungs clear.  No pleural effusion or pneumothorax.  IMPRESSION: Enlargement of cardiac silhouette post CABG.  No acute abnormalities.   Electronically Signed   By: Lavonia Dana M.D.   On: 05/28/2015 12:18    Scheduled Meds: . antiseptic oral rinse  7 mL Mouth Rinse BID  . calcitRIOL  0.25 mcg Oral Daily  . donepezil  10 mg Oral QHS  . folic acid  1 mg Oral q morning - 10a  . furosemide  80 mg Intravenous BID  . heparin  5,000 Units Subcutaneous 3 times per day  . hydrALAZINE  75 mg Oral 3 times per day  . insulin aspart  0-9 Units Subcutaneous TID WC  . insulin aspart  3 Units Subcutaneous TID WC  .  insulin detemir  8 Units Subcutaneous QHS  . isosorbide mononitrate  15 mg Oral q morning - 10a  . levETIRAcetam  500 mg Oral BID  . metoprolol succinate  50 mg Oral Daily  . metroNIDAZOLE  500 mg Oral 3 times per day  . pantoprazole  40 mg Oral q morning - 10a  . pravastatin  20 mg Oral QHS   Continuous Infusions: .  sodium bicarbonate  infusion 1000 mL 135 mL/hr at 05/29/15 1006    Principal Problem:   Uremia Active Problems:   Diabetes mellitus   Elevated troponin   Hyperkalemia   Acute on chronic renal failure   CKD stage 4 due to type 2 diabetes mellitus   Normocytic anemia    Time spent: 35 minutes. Greater than 50% of this time was spent in direct contact with the patient coordinating care.    Lelon Frohlich  [facility] Pager [phone]  If 7PM-7AM, please contact night-coverage at [email or web address], password [facility] 05/29/2015, 12:25 PM  LOS: 1 day

## 2015-05-29 NOTE — Progress Notes (Signed)
VALUE ALERT  Value received:  C-diff positive (Stool)  Date of notification:  05/29/15  Time of notification:  0120   Value read back: yes  Nurse who received alert:  R. Jacqualyn Posey RN  MD notified (1st page):  Dr. Georgiann Mohs  Time of first page:  0145  MD notified (2nd page):  Time of second page:  Responding MD:  Dr. Georgiann Mohs  Time MD responded:  [phone]

## 2015-05-29 NOTE — Progress Notes (Signed)
Called report to Jomarie Longs, RN on dept 300.  Verbalized understanding.  Pt transferred to room 339 in safe and stable condition. Schonewitz, Eulis Canner 05/29/2015

## 2015-05-29 NOTE — Care Management Note (Signed)
Case Management Note  Patient Details  Name: REKA WIST MRN: [id number] Date of Birth: 1940/12/15   Expected Discharge Date:  06/01/15               Expected Discharge Plan:  [place]  In-House Referral:  NA  Discharge planning Services  CM Consult  Post Acute Care Choice:  Resumption of Svcs/PTA Provider Choice offered to:  NA  DME Arranged:    DME Agency:     HH Arranged:  RN [place] Agency:  [place]  Status of Service:  In process, will continue to follow  Medicare Important Message Given:    Date Medicare IM Given:    Medicare IM give by:    Date Additional Medicare IM Given:    Additional Medicare Important Message give by:     If discussed at [place] of Stay Meetings, dates discussed:    Additional Comments: Pt is from home, lives with sister [name]. Pt has CAP aid and HH RN through [facility]. [facility] is aware of admission. Pt plans to return home with [facility] services through [facility] at discharge. Pt will need order to resume [place] services. Pt says she has no new DME needs this admission. CM to continue to follow for DC planning.  Sherald Barge, RN 05/29/2015, 3:31 PM

## 2015-05-29 NOTE — Progress Notes (Signed)
Subjective: Patient offers no complaints. She didn't have any nausea or vomiting no difficulty breathing. Objective: Vital signs in last 24 hours: Temp:  [93.9 F (34.4 C)-98.6 F (37 C)] 98.5 F (36.9 C) (07/05 0400) Pulse Rate:  [25-88] 47 (07/05 0600) Resp:  [14-21] 21 (07/05 0600) BP: (102-146)/(44-92) 146/58 mmHg (07/05 0600) SpO2:  [86 %-100 %] 95 % (07/05 0600) Weight:  [100 lb (45.36 kg)-111 lb 12.4 oz (50.7 kg)] 111 lb 12.4 oz (50.7 kg) (07/05 0500)  Intake/Output from previous day: 07/04 0701 - 07/05 0700 In: 3330 [P.O.:410; I.V.:1710; IV Piggyback:1210] Out: 300 [Urine:150; Stool:150] Intake/Output this shift:     Recent Labs  05/28/15 1149  HGB 7.9*    Recent Labs  05/28/15 1149  WBC 9.3  RBC 2.87*  HCT 26.3*  PLT 288    Recent Labs  05/28/15 1534 05/29/15 0543  NA 145 149*  K 6.6* 4.5  CL 126* 124*  CO2 9* 11*  BUN 180* 162*  CREATININE 6.75* 6.04*  GLUCOSE 175* 100*  CALCIUM 8.7* 7.9*   No results for input(s): LABPT, INR in the last 72 hours.  Generally patient is more alert today and she doesn't seem to be in any apparent distress. Chest is clear to auscultation Heart exam is regular rate and rhythm no murmur Abdomen soft positive bowel sounds Extremities no edema  Assessment/Plan: Problem #1 hyperkalemia: Her potassium is 4.5 has normalized. Problem #2 acute kidney injury: Her BUN and creatinine at this moment is improving. Thought to be secondary to prerenal/ATN Problem #3 chronic renal failure stage IV : Etiology was thought to be secondary to diabetes/hypertension/recurrent acute kidney injury Problem #4 metabolic acidosis: Patient on sodium bicarbonate her CO2 has improved. Problem #5 anemia: Her hemoglobin is below our target goal. Problem #6 metabolic bone disease: Her calcium is within our target goal Problem #7 history of CVA Problem #8 hypernatremia: Possibly from lack of free water Problem #9 C. difficile colitis Plan:  We'll change her IV fluid to D5 water with 100 mEq of sodium bicarbonate at 135 cc per hour We'll check her basic metabolic panel in the morning.   Shakala Marlatt S 05/29/2015, 7:12 AM

## 2015-05-29 NOTE — Progress Notes (Signed)
Patient arrived to the floor. Alert and oriented to person and place. Disoriented to situation. No complaints voiced at this time.

## 2015-05-30 DIAGNOSIS — N184 Chronic kidney disease, stage 4 (severe): Secondary | ICD-10-CM

## 2015-05-30 DIAGNOSIS — E1122 Type 2 diabetes mellitus with diabetic chronic kidney disease: Secondary | ICD-10-CM

## 2015-05-30 DIAGNOSIS — N19 Unspecified kidney failure: Secondary | ICD-10-CM

## 2015-05-30 DIAGNOSIS — N179 Acute kidney failure, unspecified: Principal | ICD-10-CM

## 2015-05-30 DIAGNOSIS — N189 Chronic kidney disease, unspecified: Secondary | ICD-10-CM

## 2015-05-30 LAB — IRON AND TIBC
IRON: 15 ug/dL — AB (ref 28–170)
SATURATION RATIOS: 8 % — AB (ref 10.4–31.8)
TIBC: 193 ug/dL — ABNORMAL LOW (ref 250–450)
UIBC: 178 ug/dL

## 2015-05-30 LAB — PHOSPHORUS: Phosphorus: 4.4 mg/dL (ref 2.5–4.6)

## 2015-05-30 LAB — GLUCOSE, CAPILLARY
GLUCOSE-CAPILLARY: 79 mg/dL (ref 65–99)
GLUCOSE-CAPILLARY: 100 mg/dL — AB (ref 65–99)
Glucose-Capillary: 168 mg/dL — ABNORMAL HIGH (ref 65–99)
Glucose-Capillary: 136 mg/dL — ABNORMAL HIGH (ref 65–99)
Glucose-Capillary: 61 mg/dL — ABNORMAL LOW (ref 65–99)

## 2015-05-30 LAB — CBC
HCT: 21.5 % — ABNORMAL LOW (ref 36.0–46.0)
HEMOGLOBIN: 6.8 g/dL — AB (ref 12.0–15.0)
MCH: 26.7 pg (ref 26.0–34.0)
MCHC: 31.6 g/dL (ref 30.0–36.0)
MCV: 84.3 fL (ref 78.0–100.0)
Platelets: 243 10*3/uL (ref 150–400)
RBC: 2.55 MIL/uL — ABNORMAL LOW (ref 3.87–5.11)
RDW: 17.3 % — ABNORMAL HIGH (ref 11.5–15.5)
WBC: 9.1 10*3/uL (ref 4.0–10.5)

## 2015-05-30 LAB — HEMOGLOBIN AND HEMATOCRIT, BLOOD
HCT: 28.7 % — ABNORMAL LOW (ref 36.0–46.0)
HEMOGLOBIN: 9.4 g/dL — AB (ref 12.0–15.0)

## 2015-05-30 LAB — BASIC METABOLIC PANEL
Anion gap: 13 (ref 5–15)
BUN: 128 mg/dL — AB (ref 6–20)
CHLORIDE: 100 mmol/L — AB (ref 101–111)
CO2: 29 mmol/L (ref 22–32)
Calcium: 6.5 mg/dL — ABNORMAL LOW (ref 8.9–10.3)
Creatinine, Ser: 4.19 mg/dL — ABNORMAL HIGH (ref 0.44–1.00)
GFR calc Af Amer: 11 mL/min — ABNORMAL LOW (ref >60)
GFR, EST NON AFRICAN AMERICAN: 10 mL/min — AB (ref >60)
GLUCOSE: 188 mg/dL — AB (ref 65–99)
Potassium: 2.2 mmol/L — CL (ref 3.5–5.1)
Sodium: 142 mmol/L (ref 135–145)

## 2015-05-30 LAB — PREPARE RBC (CROSSMATCH)

## 2015-05-30 LAB — HEMOGLOBIN A1C
HEMOGLOBIN A1C: 6.2 % — AB (ref 4.8–5.6)
Mean Plasma Glucose: 131 mg/dL

## 2015-05-30 LAB — FERRITIN: Ferritin: 182 ng/mL (ref 11–307)

## 2015-05-30 MED ORDER — POTASSIUM CHLORIDE IN NACL 20-0.45 MEQ/L-% IV SOLN
INTRAVENOUS | Status: DC
  Administered 2015-05-30 – 2015-06-01 (×4): via INTRAVENOUS
  Filled 2015-05-30 (×12): qty 1000

## 2015-05-30 MED ORDER — POTASSIUM CHLORIDE 10 MEQ/100ML IV SOLN
10.0000 meq | INTRAVENOUS | Status: AC
Start: 2015-05-30 — End: 2015-05-30
  Administered 2015-05-30 (×2): 10 meq via INTRAVENOUS
  Filled 2015-05-30 (×2): qty 100

## 2015-05-30 MED ORDER — SODIUM CHLORIDE 0.9 % IV SOLN
Freq: Once | INTRAVENOUS | Status: AC
  Administered 2015-05-30: 13:00:00 via INTRAVENOUS

## 2015-05-30 NOTE — Progress Notes (Signed)
[facility] PROGRESS NOTE  VANNAH NADAL VOJ:[id number] DOB: 1941/07/03    PCP:   [name], MD   HPI: [name] is an 74 y.o. female with hx of CKD 4, HTN, DM, dementia, seizure disorder, hx of GI bleed, anemia of chronic disease and CKD, admitted for altered mental status, queried uremia, and hyperkalemia.  She has been seen by nephrology, and has been stable, but she does have confusions.  Her K is now low.  Her Hb is 6.8 g per dL today, she has chronic anemia, and seems to be tolerating it well.    Rewiew of Systems:  Constitutional: Negative for malaise, fever and chills. No significant weight loss or weight gain Eyes: Negative for eye pain, redness and discharge, diplopia, visual changes, or flashes of light. ENMT: Negative for ear pain, hoarseness, nasal congestion, sinus pressure and sore throat. No headaches; tinnitus, drooling, or problem swallowing. Cardiovascular: Negative for chest pain, palpitations, diaphoresis, dyspnea and peripheral edema. ; No orthopnea, PND Respiratory: Negative for cough, hemoptysis, wheezing and stridor. No pleuritic chestpain. Gastrointestinal: Negative for nausea, vomiting, diarrhea, constipation, abdominal pain, melena, blood in stool, hematemesis, jaundice and rectal bleeding.    Genitourinary: Negative for frequency, dysuria, incontinence,flank pain and hematuria; Musculoskeletal: Negative for back pain and neck pain. Negative for swelling and trauma.;  Skin: . Negative for pruritus, rash, abrasions, bruising and skin lesion.; ulcerations Neuro: Negative for headache, lightheadedness and neck stiffness. Negative for weakness, altered level of consciousness , altered mental status, extremity weakness, burning feet, involuntary movement, seizure and syncope.  Psych: negative for anxiety, depression, insomnia, tearfulness, panic attacks, hallucinations, paranoia, suicidal or homicidal ideation    Past Medical History  Diagnosis Date   . Type 2 diabetes mellitus   . Essential hypertension, benign   . History of stroke     Previously on Coumadin  . MI, old     Reported 89  . Seizure disorder   . Coronary atherosclerosis of native coronary artery     Multivessel status post CABG 2002  . History of GI bleed     Erosive gastritis and duodenitis by EGD 2002  . Mixed hyperlipidemia   . Ischemic cardiomyopathy     LVEF 40-45% March 2013  . Dementia   . CKD (chronic kidney disease) stage 4, GFR 15-29 ml/min   . CHF (congestive heart failure)   . Chronic respiratory failure with hypoxia   . Stroke   . Seizures   . Anemia of chronic renal failure, stage 4 (severe) 01/02/2015    Past Surgical History  Procedure Laterality Date  . Coronary artery bypass graft  July 2002    LIMA to LAD, SVG to OM1, SVG to OM 2, SVG to RCA  . Tee without cardioversion  01/28/2012    Procedure: TRANSESOPHAGEAL ECHOCARDIOGRAM (TEE);  Surgeon: Lelon Perla, MD;  Location: [facility] ENDOSCOPY;  Service: Cardiovascular;  Laterality: [name];  . Colonoscopy  2002  . Esophagogastroduodenoscopy  2002  . Esophagogastroduodenoscopy [name] 01/18/2014    XHB:ZJIRC hiatal hernia. Abnormal gastric and duodenal bulbar mucosa of uncertain significance - status post gastric bx (chronic gastritis/H.pylori +  . Colonoscopy [name] 06/25/2014    Dr. Laural Golden: tubular adenoma, pandiverticulosis.   . Agile capsule [name] 07/06/2014    Procedure: AGILE CAPSULE;  Surgeon: Danie Binder, MD;  Location: [facility] ENDO SUITE;  Service: Endoscopy;  Laterality: [name];  730  . Esophagogastroduodenoscopy [name] 07/20/2014    Dr. Gala Romney: tiny gastric polyps not manipulated. minimal pigmentation  of duodenal bulb likely not significant  . Givens capsule study [name] 07/20/2014    incomplete    Medications:  HOME MEDS: Prior to Admission medications   Medication Sig Start Date End Date Taking? Authorizing Provider  calcitRIOL (ROCALTROL) 0.25 MCG capsule Take 1 capsule (0.25 mcg total) by mouth daily.  05/02/14  Yes Eugenie Filler, MD  donepezil (ARICEPT) 10 MG tablet Take 10 mg by mouth at bedtime.  01/30/14  Yes Historical Provider, MD  folic acid (FOLVITE) 1 MG tablet Take 1 mg by mouth every morning.    Yes Historical Provider, MD  hydrALAZINE (APRESOLINE) 25 MG tablet Take 75 mg by mouth every 8 (eight) hours.    Yes Historical Provider, MD  insulin detemir (LEVEMIR) 100 UNIT/ML injection Inject 0.08 mLs (8 Units total) into the skin at bedtime. 05/02/14  Yes Eugenie Filler, MD  isosorbide mononitrate (IMDUR) 30 MG 24 hr tablet Take 0.5 tablets (15 mg total) by mouth every morning. 01/09/15  Yes Lezlie Octave Black, NP  levETIRAcetam (KEPPRA) 100 MG/ML solution Take 500 mg by mouth 2 (two) times daily.  09/29/14  Yes Historical Provider, MD  linagliptin (TRADJENTA) 5 MG TABS tablet Take 5 mg by mouth every morning.    Yes Historical Provider, MD  Maltodextrin-Xanthan Gum (RESOURCE THICKENUP CLEAR) POWD Take 120 g by mouth as needed. Patient taking differently: Take 1 Can by mouth daily.  01/09/15  Yes Lezlie Octave Black, NP  metoprolol succinate (TOPROL-XL) 100 MG 24 hr tablet Take 50 mg by mouth daily. Take with or immediately following a meal.   Yes Historical Provider, MD  mupirocin ointment (BACTROBAN) 2 % Apply 1 application topically 2 (two) times daily.  01/03/15  Yes Historical Provider, MD  pantoprazole (PROTONIX) 40 MG tablet Take 40 mg by mouth every morning.   Yes Historical Provider, MD  potassium chloride (K-DUR) 10 MEQ tablet Take 1 tablet (10 mEq total) by mouth daily. Patient taking differently: Take 20 mEq by mouth daily.  02/23/15  Yes Lendon Colonel, NP  pravastatin (PRAVACHOL) 20 MG tablet Take 20 mg by mouth at bedtime.    Yes Historical Provider, MD  torsemide (DEMADEX) 20 MG tablet Take 3 tablets (60 mg total) by mouth daily. 03/09/15  Yes Lendon Colonel, NP     Allergies:  No Known Allergies  Social History:   reports that she quit smoking about 3 years ago. Her smoking  use included Cigarettes. She started smoking about 54 years ago. She has a 50 pack-year smoking history. She has never used smokeless tobacco. She reports that she does not drink alcohol or use illicit drugs.  Family History: Family History  Problem Relation Age of Onset  . Stroke Father   . Diabetes type II Mother   . Colon cancer Neg Hx      Physical Exam: Filed Vitals:   05/29/15 1319 05/29/15 1707 05/29/15 2238 05/30/15 0619  BP: 144/49 126/54 132/81 128/60  Pulse:  106 98 81  Temp:  97.8 F (36.6 C) 98.2 F (36.8 C) 98.1 F (36.7 C)  TempSrc:  Oral Oral Oral  Resp:  20 20 20   Height:      Weight:      SpO2:  100% 100% 100%   Blood pressure 128/60, pulse 81, temperature 98.1 F (36.7 C), temperature source Oral, resp. rate 20, height 5\' 3"  (1.6 m), weight 50.7 kg (111 lb 12.4 oz), SpO2 100 %.  GEN:  Pleasant patient lying in the  stretcher in no acute distress; cooperative with exam. PSYCH:  alert and oriented x4; does not appear anxious or depressed; affect is appropriate. HEENT: Mucous membranes pink and anicteric; PERRLA; EOM intact; no cervical lymphadenopathy nor thyromegaly or carotid bruit; no JVD; There were no stridor. Neck is very supple. Breasts:: Not examined CHEST WALL: No tenderness CHEST: Normal respiration, clear to auscultation bilaterally.  HEART: Regular rate and rhythm.  There are no murmur, rub, or gallops.   BACK: No kyphosis or scoliosis; no CVA tenderness ABDOMEN: soft and non-tender; no masses, no organomegaly, normal abdominal bowel sounds; no pannus; no intertriginous candida. There is no rebound and no distention. Rectal Exam: Not done EXTREMITIES: No bone or joint deformity; age-appropriate arthropathy of the hands and knees; no edema; no ulcerations.  There is no calf tenderness. Genitalia: not examined PULSES: 2+ and symmetric SKIN: Normal hydration no rash or ulceration CNS: Cranial nerves 2-12 grossly intact no focal lateralizing  neurologic deficit.  Speech is fluent; uvula elevated with phonation, facial symmetry and tongue midline. DTR are normal bilaterally, cerebella exam is intact, barbinski is negative and strengths are equaled bilaterally.  No sensory loss.   Labs on Admission:  Basic Metabolic Panel:  Recent Labs Lab 05/28/15 1149 05/28/15 1534 05/29/15 0543 05/30/15 0635  NA 142 145 149* 142  K 7.4* 6.6* 4.5 2.2*  CL 122* 126* 124* 100*  CO2 10* 9* 11* 29  GLUCOSE 214* 175* 100* 188*  BUN 222* 180* 162* 128*  CREATININE 7.16* 6.75* 6.04* 4.19*  CALCIUM 8.6* 8.7* 7.9* 6.5*  PHOS  --   --   --  4.4   Liver Function Tests:  Recent Labs Lab 05/28/15 1149 05/29/15 0543  AST 22 28  ALT 17 17  ALKPHOS 119 97  BILITOT 0.5 0.4  PROT 9.5* 8.0  ALBUMIN 3.2* 2.8*   CBC:  Recent Labs Lab 05/28/15 1149 05/30/15 0635  WBC 9.3 9.1  NEUTROABS 8.3*  --   HGB 7.9* 6.8*  HCT 26.3* 21.5*  MCV 91.6 84.3  PLT 288 243   Cardiac Enzymes:  Recent Labs Lab 05/28/15 1149  TROPONINI 0.07*    CBG:  Recent Labs Lab 05/28/15 2124 05/29/15 0723 05/29/15 1123 05/29/15 2142 05/30/15 0739  GLUCAP 79 96 176* 136* 168*     Radiological Exams on Admission: Dg Chest Port 1 View  05/28/2015   CLINICAL DATA:  Weakness and cough for 3 days, low rectal temperature  EXAM: PORTABLE CHEST - 1 VIEW  COMPARISON:  Portable exam 1203 hours compared to 04/25/2015  FINDINGS: Enlargement of cardiac silhouette post CABG.  Mediastinal contours and pulmonary vascularity normal.  Atherosclerotic calcification aorta.  Lungs clear.  No pleural effusion or pneumothorax.  IMPRESSION: Enlargement of cardiac silhouette post CABG.  No acute abnormalities.   Electronically Signed   By: Lavonia Dana M.D.   On: 05/28/2015 12:18    EKG: Independently reviewed.    Assessment/Plan Present on Admission:  . Acute on chronic renal failure . Uremia . Hyperkalemia . Elevated troponin  PLAN:Hyperkalemia -Potassium down to 4.5  today after insulin and Kayexalate administered yesterday. -She has hypokalemia now at 2.2 mE/L and will need replacement.  -Continue to monitor.  Acute on chronic kidney disease stage IV -Creatinine is improving. -Acute component likely related to prerenal azotemia/ATN. -Appreciate nephrology input and recommendations.  Metabolic acidosis -Secondary to advanced renal failure. -Bicarbonate up to 11 today, continue bicarbonate 90 fluids as per nephrology recommendations.  Hypernatremia -Likely secondary to lack of  free water, to receive D5W.  Questionable C. difficile colitis -Patient has had no symptoms of C. difficile including diarrhea or abdominal pain. -Patient received Kayexalate overnight for treatment of her hyperkalemia which resulted in expected diarrhea. I am assuming because of diarrhea C. difficile PCR was checked overnight and has resulted positive. -I have discussed this case with infectious diseases, Dr. Graylon Good. We presume that this patient is colonized with C. difficile and has diarrhea because of Kayexalate, continue isolation but does not need treatment at this time.  Normocytic anemia -Secondary to chronic disease from chronic renal failure.  Hb dropped to 6.8 g today, will transfuse 1 unit of PRBC.  May need EPO.  -Recheck hemoglobin in the morning.  Elevated troponin -Likely secondary to severe renal insufficiency. No chest pain or shortness of breath to indicate acute coronary syndrome. -No further cardiac workup anticipated.  Diabetes mellitus -Fair control, continue current regimen  Code Status: Remains full code; need to have further discussions with family regarding CODE STATUS. Family Communication: Daughter and sister at bedside updated on plan of care  Disposition Plan: Transfer to floor today, general disposition to be determined based upon medical progress   Simrin Vegh, MD. [facility] Pager [phone] 7pm to 7am.  05/30/2015, 11:37 AM

## 2015-05-30 NOTE — Progress Notes (Signed)
Subjective: Patient offers no complaints. Her appetite is good and no nausea or vomiting. Objective: Vital signs in last 24 hours: Temp:  [97.8 F (36.6 C)-98.2 F (36.8 C)] 98.1 F (36.7 C) (07/06 0619) Pulse Rate:  [81-106] 81 (07/06 0619) Resp:  [20] 20 (07/06 0619) BP: (120-144)/(49-81) 128/60 mmHg (07/06 0619) SpO2:  [100 %] 100 % (07/06 0619)  Intake/Output from previous day: 07/05 0701 - 07/06 0700 In: 2536.5 [P.O.:120; I.V.:2416.5] Out: 6010 [Urine:3550] Intake/Output this shift:     Recent Labs  05/28/15 1149 05/30/15 0635  HGB 7.9* 6.8*    Recent Labs  05/28/15 1149 05/30/15 0635  WBC 9.3 9.1  RBC 2.87* 2.55*  HCT 26.3* 21.5*  PLT 288 243    Recent Labs  05/29/15 0543 05/30/15 0635  NA 149* 142  K 4.5 2.2*  CL 124* 100*  CO2 11* 29  BUN 162* 128*  CREATININE 6.04* 4.19*  GLUCOSE 100* 188*  CALCIUM 7.9* 6.5*   No results for input(s): LABPT, INR in the last 72 hours.  Generally patient is more alert today and she doesn't seem to be in any apparent distress. Chest is clear to auscultation Heart exam is regular rate and rhythm no murmur Abdomen soft positive bowel sounds Extremities no edema  Assessment/Plan: Problem #1 hyperkalemia: Her potassium has declined and presently is 2.2. This is related to Lasix. Problem #2 acute kidney injury: . Thought to be secondary to prerenal/ATN. Her BUN and creatinine has significantly improved. At this moment still is above her baseline. Problem #3 chronic renal failure stage IV : Etiology was thought to be secondary to diabetes/hypertension/recurrent acute kidney injury Problem #4 metabolic acidosis: Patient on sodium bicarbonate her CO2 has corrected. Problem #5 anemia: Her hemoglobin is below our target goal. Problem #6 metabolic bone disease: Her calcium is within our target goal Problem #7 history of CVA Problem #8 hypernatremia: Sodium 142 and within normal range. Problem #9 C. difficile  colitis Plan: We'll change her IV fluid to half normal with 20 meq kcl at 75  mL per hour and DC sodium bicarbonate 2] will DC Lasix 3] we'll give her KCl 10 mEq over 1 hour 2 doses. We'll check her basic metabolic panel in the morning.   Tiesha Marich S 05/30/2015, 10:47 AM

## 2015-05-31 DIAGNOSIS — R7989 Other specified abnormal findings of blood chemistry: Secondary | ICD-10-CM

## 2015-05-31 LAB — GLUCOSE, CAPILLARY
GLUCOSE-CAPILLARY: 116 mg/dL — AB (ref 65–99)
Glucose-Capillary: 133 mg/dL — ABNORMAL HIGH (ref 65–99)
Glucose-Capillary: 149 mg/dL — ABNORMAL HIGH (ref 65–99)
Glucose-Capillary: 103 mg/dL — ABNORMAL HIGH (ref 65–99)

## 2015-05-31 LAB — TYPE AND SCREEN
ABO/RH(D): A POS
Antibody Screen: NEGATIVE
Unit division: 0

## 2015-05-31 LAB — BASIC METABOLIC PANEL
Anion gap: 15 (ref 5–15)
BUN: 108 mg/dL — AB (ref 6–20)
CHLORIDE: 97 mmol/L — AB (ref 101–111)
CO2: 28 mmol/L (ref 22–32)
CREATININE: 3.77 mg/dL — AB (ref 0.44–1.00)
Calcium: 6.2 mg/dL — CL (ref 8.9–10.3)
GFR calc Af Amer: 13 mL/min — ABNORMAL LOW (ref >60)
GFR calc non Af Amer: 11 mL/min — ABNORMAL LOW (ref >60)
GLUCOSE: 135 mg/dL — AB (ref 65–99)
Potassium: 2.8 mmol/L — ABNORMAL LOW (ref 3.5–5.1)
Sodium: 140 mmol/L (ref 135–145)

## 2015-05-31 MED ORDER — FERUMOXYTOL INJECTION 510 MG/17 ML
510.0000 mg | INTRAVENOUS | Status: DC
  Administered 2015-05-31: 510 mg via INTRAVENOUS
  Filled 2015-05-31: qty 17

## 2015-05-31 NOTE — Progress Notes (Signed)
[facility] PROGRESS NOTE  [name]:[id number] DOB: 18-Oct-1941    PCP:   [name], MD   HPI:    [name] is an 74 y.o. female with hx of CKD 4, HTN, DM, dementia, seizure disorder, hx of GI bleed, anemia of chronic disease and CKD, admitted for altered mental status, queried uremia, and hyperkalemia. She has been seen by nephrology, and has been stable, but she does have confusions. Her K is now low. Her Hb is 6.8 g per dL today, she has chronic anemia, and seems to be tolerating it well. She was given one unit of PRBC, and her Hb rose to 9.4 g per dL.  Reviewed of her record showed she has been getting Aranesp every Tuesday.  Dr Mauri Brooklyn gave her Faraheme IV.    Rewiew of Systems:  Constitutional: Negative for malaise, fever and chills. No significant weight loss or weight gain Eyes: Negative for eye pain, redness and discharge, diplopia, visual changes, or flashes of light. ENMT: Negative for ear pain, hoarseness, nasal congestion, sinus pressure and sore throat. No headaches; tinnitus, drooling, or problem swallowing. Cardiovascular: Negative for chest pain, palpitations, diaphoresis, dyspnea and peripheral edema. ; No orthopnea, PND Respiratory: Negative for cough, hemoptysis, wheezing and stridor. No pleuritic chestpain. Gastrointestinal: Negative for nausea, vomiting, diarrhea, constipation, abdominal pain, melena, blood in stool, hematemesis, jaundice and rectal bleeding.    Genitourinary: Negative for frequency, dysuria, incontinence,flank pain and hematuria; Musculoskeletal: Negative for back pain and neck pain. Negative for swelling and trauma.;  Skin: . Negative for pruritus, rash, abrasions, bruising and skin lesion.; ulcerations Neuro: Negative for headache, lightheadedness and neck stiffness. Negative for weakness, altered level of consciousness , altered mental status, extremity weakness, burning feet, involuntary movement, seizure and syncope.   Psych: negative for anxiety, depression, insomnia, tearfulness, panic attacks, hallucinations, paranoia, suicidal or homicidal ideation    Past Medical History  Diagnosis Date  . Type 2 diabetes mellitus   . Essential hypertension, benign   . History of stroke     Previously on Coumadin  . MI, old     Reported 50  . Seizure disorder   . Coronary atherosclerosis of native coronary artery     Multivessel status post CABG 2002  . History of GI bleed     Erosive gastritis and duodenitis by EGD 2002  . Mixed hyperlipidemia   . Ischemic cardiomyopathy     LVEF 40-45% March 2013  . Dementia   . CKD (chronic kidney disease) stage 4, GFR 15-29 ml/min   . CHF (congestive heart failure)   . Chronic respiratory failure with hypoxia   . Stroke   . Seizures   . Anemia of chronic renal failure, stage 4 (severe) 01/02/2015    Past Surgical History  Procedure Laterality Date  . Coronary artery bypass graft  July 2002    LIMA to LAD, SVG to OM1, SVG to OM 2, SVG to RCA  . Tee without cardioversion  01/28/2012    Procedure: TRANSESOPHAGEAL ECHOCARDIOGRAM (TEE);  Surgeon: Lelon Perla, MD;  Location: [facility] ENDOSCOPY;  Service: Cardiovascular;  Laterality: [name];  . Colonoscopy  2002  . Esophagogastroduodenoscopy  2002  . Esophagogastroduodenoscopy [name] 01/18/2014    NAT:FTDDU hiatal hernia. Abnormal gastric and duodenal bulbar mucosa of uncertain significance - status post gastric bx (chronic gastritis/H.pylori +  . Colonoscopy [name] 06/25/2014    Dr. Laural Golden: tubular adenoma, pandiverticulosis.   . Agile capsule [name] 07/06/2014    Procedure: AGILE CAPSULE;  Surgeon: Danie Binder, MD;  Location: [facility] ENDO SUITE;  Service: Endoscopy;  Laterality: [name];  730  . Esophagogastroduodenoscopy [name] 07/20/2014    Dr. Gala Romney: tiny gastric polyps not manipulated. minimal pigmentation of duodenal bulb likely not significant  . Givens capsule study [name] 07/20/2014    incomplete    Medications:  HOME MEDS: Prior to  Admission medications   Medication Sig Start Date End Date Taking? Authorizing Provider  calcitRIOL (ROCALTROL) 0.25 MCG capsule Take 1 capsule (0.25 mcg total) by mouth daily. 05/02/14  Yes Eugenie Filler, MD  donepezil (ARICEPT) 10 MG tablet Take 10 mg by mouth at bedtime.  01/30/14  Yes Historical Provider, MD  folic acid (FOLVITE) 1 MG tablet Take 1 mg by mouth every morning.    Yes Historical Provider, MD  hydrALAZINE (APRESOLINE) 25 MG tablet Take 75 mg by mouth every 8 (eight) hours.    Yes Historical Provider, MD  insulin detemir (LEVEMIR) 100 UNIT/ML injection Inject 0.08 mLs (8 Units total) into the skin at bedtime. 05/02/14  Yes Eugenie Filler, MD  isosorbide mononitrate (IMDUR) 30 MG 24 hr tablet Take 0.5 tablets (15 mg total) by mouth every morning. 01/09/15  Yes Lezlie Octave Black, NP  levETIRAcetam (KEPPRA) 100 MG/ML solution Take 500 mg by mouth 2 (two) times daily.  09/29/14  Yes Historical Provider, MD  linagliptin (TRADJENTA) 5 MG TABS tablet Take 5 mg by mouth every morning.    Yes Historical Provider, MD  Maltodextrin-Xanthan Gum (RESOURCE THICKENUP CLEAR) POWD Take 120 g by mouth as needed. Patient taking differently: Take 1 Can by mouth daily.  01/09/15  Yes Lezlie Octave Black, NP  metoprolol succinate (TOPROL-XL) 100 MG 24 hr tablet Take 50 mg by mouth daily. Take with or immediately following a meal.   Yes Historical Provider, MD  mupirocin ointment (BACTROBAN) 2 % Apply 1 application topically 2 (two) times daily.  01/03/15  Yes Historical Provider, MD  pantoprazole (PROTONIX) 40 MG tablet Take 40 mg by mouth every morning.   Yes Historical Provider, MD  potassium chloride (K-DUR) 10 MEQ tablet Take 1 tablet (10 mEq total) by mouth daily. Patient taking differently: Take 20 mEq by mouth daily.  02/23/15  Yes Lendon Colonel, NP  pravastatin (PRAVACHOL) 20 MG tablet Take 20 mg by mouth at bedtime.    Yes Historical Provider, MD  torsemide (DEMADEX) 20 MG tablet Take 3 tablets (60 mg  total) by mouth daily. 03/09/15  Yes Lendon Colonel, NP     Allergies:  No Known Allergies  Social History:   reports that she quit smoking about 3 years ago. Her smoking use included Cigarettes. She started smoking about 54 years ago. She has a 50 pack-year smoking history. She has never used smokeless tobacco. She reports that she does not drink alcohol or use illicit drugs.  Family History: Family History  Problem Relation Age of Onset  . Stroke Father   . Diabetes type II Mother   . Colon cancer Neg Hx      Physical Exam: Filed Vitals:   05/30/15 1651 05/30/15 1912 05/30/15 2310 05/31/15 0619  BP: 101/43 125/55 134/77 108/75  Pulse: 104 103 102 102  Temp: 98.6 F (37 C) 98.8 F (37.1 C) 98.3 F (36.8 C) 97.8 F (36.6 C)  TempSrc: Oral Oral Oral Oral  Resp: 16 16 18 18   Height:      Weight:      SpO2: 99% 99% 99% 96%   Blood  pressure 108/75, pulse 102, temperature 97.8 F (36.6 C), temperature source Oral, resp. rate 18, height 5\' 3"  (1.6 m), weight 50.7 kg (111 lb 12.4 oz), SpO2 96 %.  GEN:  Pleasant  patient lying in the stretcher in no acute distress; cooperative with exam. PSYCH:  alert and oriented x4; does not appear anxious or depressed; affect is appropriate. HEENT: Mucous membranes pink and anicteric; PERRLA; EOM intact; no cervical lymphadenopathy nor thyromegaly or carotid bruit; no JVD; There were no stridor. Neck is very supple. Breasts:: Not examined CHEST WALL: No tenderness CHEST: Normal respiration, clear to auscultation bilaterally.  HEART: Regular rate and rhythm.  There are no murmur, rub, or gallops.   BACK: No kyphosis or scoliosis; no CVA tenderness ABDOMEN: soft and non-tender; no masses, no organomegaly, normal abdominal bowel sounds; no pannus; no intertriginous candida. There is no rebound and no distention. Rectal Exam: Not done EXTREMITIES: No bone or joint deformity; age-appropriate arthropathy of the hands and knees; no edema; no  ulcerations.  There is no calf tenderness. Genitalia: not examined PULSES: 2+ and symmetric SKIN: Normal hydration no rash or ulceration CNS: Cranial nerves 2-12 grossly intact no focal lateralizing neurologic deficit.  Speech is fluent; uvula elevated with phonation, facial symmetry and tongue midline. DTR are normal bilaterally, cerebella exam is intact, barbinski is negative and strengths are equaled bilaterally.  No sensory loss.   Labs on Admission:  Basic Metabolic Panel:  Recent Labs Lab 05/28/15 1149 05/28/15 1534 05/29/15 0543 05/30/15 0635 05/31/15 0711  NA 142 145 149* 142 140  K 7.4* 6.6* 4.5 2.2* 2.8*  CL 122* 126* 124* 100* 97*  CO2 10* 9* 11* 29 28  GLUCOSE 214* 175* 100* 188* 135*  BUN 222* 180* 162* 128* 108*  CREATININE 7.16* 6.75* 6.04* 4.19* 3.77*  CALCIUM 8.6* 8.7* 7.9* 6.5* 6.2*  PHOS  --   --   --  4.4  --    Liver Function Tests:  Recent Labs Lab 05/28/15 1149 05/29/15 0543  AST 22 28  ALT 17 17  ALKPHOS 119 97  BILITOT 0.5 0.4  PROT 9.5* 8.0  ALBUMIN 3.2* 2.8*   No results for input(s): LIPASE, AMYLASE in the last 168 hours. No results for input(s): AMMONIA in the last 168 hours. CBC:  Recent Labs Lab 05/28/15 1149 05/30/15 0635 05/30/15 2020  WBC 9.3 9.1  --   NEUTROABS 8.3*  --   --   HGB 7.9* 6.8* 9.4*  HCT 26.3* 21.5* 28.7*  MCV 91.6 84.3  --   PLT 288 243  --    Cardiac Enzymes:  Recent Labs Lab 05/28/15 1149  TROPONINI 0.07*    CBG:  Recent Labs Lab 05/30/15 1202 05/30/15 1709 05/30/15 2314 05/31/15 0728 05/31/15 1126  GLUCAP 136* 61* 100* 133* 116*   Assessment/Plan Present on Admission:  . Acute on chronic renal failure . Uremia . Hyperkalemia . Elevated troponin  PLAN:Hyperkalemia -Potassium down to 4.5 today after insulin and Kayexalate administered yesterday. -She has hypokalemia now at 2.6 mE/L and will need replacement.  -Continue to monitor. Plan to d/c tomorrow if OK with nephrology.   Acute  on chronic kidney disease stage IV -Creatinine is improving. -Acute component likely related to prerenal azotemia/ATN. -Appreciate nephrology input and recommendations.  Metabolic acidosis -Secondary to advanced renal failure. No further bicarb given by nephrology.   Hypernatremia -Likely secondary to lack of free water, to receive D5W.  Questionable C. difficile colitis -Patient has had no symptoms of C.  difficile including diarrhea or abdominal pain. -Patient received Kayexalate overnight for treatment of her hyperkalemia which resulted in expected diarrhea. I am assuming because of diarrhea C. difficile PCR was checked overnight and has resulted positive. -I have discussed this case with infectious diseases, Dr. Graylon Good. We presume that this patient is colonized with C. difficile and has diarrhea because of Kayexalate, continue isolation but does not need treatment at this time.  Normocytic anemia -Secondary to chronic disease from chronic renal failure. S/p PRBC now 9.4 grams.  On chronic Aranesp.  IV Heme given by Nephrology.  Elevated troponin -Likely secondary to severe renal insufficiency. No chest pain or shortness of breath to indicate acute coronary syndrome. -No further cardiac workup anticipated.  Diabetes mellitus -Fair control, continue current regimen  Other plans as per orders.  Code Status: FULL Haskel Khan, MD. [facility] Pager [phone] 7pm to 7am.  05/31/2015, 4:34 PM

## 2015-05-31 NOTE — Progress Notes (Signed)
Subjective: Patient offers no complaints. She denies any difficulty in breathing. Objective: Vital signs in last 24 hours: Temp:  [97.8 F (36.6 C)-98.8 F (37.1 C)] 97.8 F (36.6 C) (07/07 0619) Pulse Rate:  [83-104] 102 (07/07 0619) Resp:  [16-18] 18 (07/07 0619) BP: (101-136)/(43-77) 108/75 mmHg (07/07 0619) SpO2:  [96 %-100 %] 96 % (07/07 0619)  Intake/Output from previous day: 07/06 0701 - 07/07 0700 In: 1591.8 [I.V.:1303.8; Blood:288] Out: 2000 [Urine:1900; Stool:100] Intake/Output this shift:     Recent Labs  05/28/15 1149 05/30/15 0635 05/30/15 2020  HGB 7.9* 6.8* 9.4*    Recent Labs  05/28/15 1149 05/30/15 0635 05/30/15 2020  WBC 9.3 9.1  --   RBC 2.87* 2.55*  --   HCT 26.3* 21.5* 28.7*  PLT 288 243  --     Recent Labs  05/30/15 0635 05/31/15 0711  NA 142 140  K 2.2* 2.8*  CL 100* 97*  CO2 29 28  BUN 128* 108*  CREATININE 4.19* 3.77*  GLUCOSE 188* 135*  CALCIUM 6.5* 6.2*   No results for input(s): LABPT, INR in the last 72 hours.  Generally patient is more alert today and she doesn't seem to be in any apparent distress. Chest is clear to auscultation Heart exam is regular rate and rhythm no murmur Abdomen soft positive bowel sounds Extremities no edema  Assessment/Plan: Problem #1 hyperkalemia: Her potassium has declined and presently is 2.8. Low but improving. Presently she is on potassium supplement. Problem #2 acute kidney injury: . Thought to be secondary to prerenal/ATN. Her BUN and creatinine continue to improve.. Patient is nonoliguric. Problem #3 chronic renal failure stage IV : Etiology was thought to be secondary to diabetes/hypertension/recurrent acute kidney injury Problem #4 metabolic acidosis: Patient on sodium bicarbonate her CO2 has corrected. Patient is off sodium bicarbonate. Problem #5 anemia: Her hemoglobin is below our target goal. Her iron saturation is 8% and ferritin is 308. Hence patient has a compression of iron  deficiency and anemia of chronic disease. Problem #6 metabolic bone disease: Her calcium is within our target goal Problem #7 history of CVA Problem #8 hypernatremia: Sodium 14o and within normal range. Problem #9 C. difficile colitis Plan: We'll give patient Feraheme 510 mg IV today and another dose after a week. 2] we'll continue with IV fluid and potassium supplement. We'll check her basic metabolic panel in the morning.   Tesa Meadors S 05/31/2015, 9:18 AM

## 2015-05-31 NOTE — Progress Notes (Signed)
Inpatient Diabetes Program Recommendations  AACE/ADA: New Consensus Statement on Inpatient Glycemic Control (2013)  Target Ranges:  Prepandial:   less than 140 mg/dL      Peak postprandial:   less than 180 mg/dL (1-2 hours)      Critically ill patients:  140 - 180 mg/dL   Results for [name], [name] (MRN [id number]) as of 05/31/2015 11:22  Ref. Range 05/30/2015 07:39 05/30/2015 12:02 05/30/2015 17:09 05/30/2015 23:14 05/31/2015 07:28  Glucose-Capillary Latest Ref Range: 65-99 mg/dL 168 (H) 136 (H) 61 (L) 100 (H) 133 (H)   Consider reducing Novolog meal coverage to 2 units tid with meals.  Thanks, Adah Perl, RN, BC-ADM Inpatient Diabetes Coordinator Pager [phone] (8a-5p)

## 2015-06-01 LAB — GLUCOSE, CAPILLARY
GLUCOSE-CAPILLARY: 143 mg/dL — AB (ref 65–99)
GLUCOSE-CAPILLARY: 150 mg/dL — AB (ref 65–99)
Glucose-Capillary: 49 mg/dL — ABNORMAL LOW (ref 65–99)
Glucose-Capillary: 54 mg/dL — ABNORMAL LOW (ref 65–99)
Glucose-Capillary: 118 mg/dL — ABNORMAL HIGH (ref 65–99)
Glucose-Capillary: 103 mg/dL — ABNORMAL HIGH (ref 65–99)

## 2015-06-01 LAB — BASIC METABOLIC PANEL
Anion gap: 12 (ref 5–15)
BUN: 101 mg/dL — ABNORMAL HIGH (ref 6–20)
CO2: 26 mmol/L (ref 22–32)
CREATININE: 3.09 mg/dL — AB (ref 0.44–1.00)
Calcium: 6.3 mg/dL — CL (ref 8.9–10.3)
Chloride: 101 mmol/L (ref 101–111)
GFR calc non Af Amer: 14 mL/min — ABNORMAL LOW (ref >60)
GFR, EST AFRICAN AMERICAN: 16 mL/min — AB (ref >60)
Glucose, Bld: 55 mg/dL — ABNORMAL LOW (ref 65–99)
POTASSIUM: 3.6 mmol/L (ref 3.5–5.1)
SODIUM: 139 mmol/L (ref 135–145)

## 2015-06-01 MED ORDER — INSULIN ASPART 100 UNIT/ML ~~LOC~~ SOLN
2.0000 [IU] | Freq: Three times a day (TID) | SUBCUTANEOUS | Status: DC
  Administered 2015-06-01 – 2015-06-02 (×2): 2 [IU] via SUBCUTANEOUS

## 2015-06-01 MED ORDER — SODIUM CHLORIDE 0.45 % IV SOLN
INTRAVENOUS | Status: DC
  Administered 2015-06-01: 12:00:00 via INTRAVENOUS
  Filled 2015-06-01 (×10): qty 1000

## 2015-06-01 MED ORDER — INSULIN DETEMIR 100 UNIT/ML ~~LOC~~ SOLN
6.0000 [IU] | Freq: Every day | SUBCUTANEOUS | Status: DC
  Administered 2015-06-01 – 2015-06-02 (×2): 6 [IU] via SUBCUTANEOUS
  Filled 2015-06-01 (×3): qty 0.06

## 2015-06-01 NOTE — Progress Notes (Signed)
[facility] PROGRESS NOTE  [name] RKY:[id number] DOB: 31-Jul-1941    PCP:   [name], MD   HPI: [name] is an 74 y.o. female with hx of CKD 4, HTN, DM, dementia, seizure disorder, hx of GI bleed, anemia of chronic disease and CKD, admitted for altered mental status, queried uremia, and hyperkalemia. She has been seen by nephrology, and has been stable, but she does have confusions. Her K is now low. Her Hb is 6.8 g per dL today, she has chronic anemia, and seems to be tolerating it well. She was given one unit of PRBC, and her Hb rose to 9.4 g per dL. Reviewed of her record showed she has been getting Aranesp every Tuesday. Dr Mauri Brooklyn gave her Faraheme IV.  She is feeling better.  Rewiew of Systems:  Constitutional: Negative for malaise, fever and chills. No significant weight loss or weight gain Eyes: Negative for eye pain, redness and discharge, diplopia, visual changes, or flashes of light. ENMT: Negative for ear pain, hoarseness, nasal congestion, sinus pressure and sore throat. No headaches; tinnitus, drooling, or problem swallowing. Cardiovascular: Negative for chest pain, palpitations, diaphoresis, dyspnea and peripheral edema. ; No orthopnea, PND Respiratory: Negative for cough, hemoptysis, wheezing and stridor. No pleuritic chestpain. Gastrointestinal: Negative for nausea, vomiting, diarrhea, constipation, abdominal pain, melena, blood in stool, hematemesis, jaundice and rectal bleeding.    Genitourinary: Negative for frequency, dysuria, incontinence,flank pain and hematuria; Musculoskeletal: Negative for back pain and neck pain. Negative for swelling and trauma.;  Skin: . Negative for pruritus, rash, abrasions, bruising and skin lesion.; ulcerations Neuro: Negative for headache, lightheadedness and neck stiffness. Negative for weakness, altered level of consciousness , altered mental status, extremity weakness, burning feet, involuntary movement,  seizure and syncope.  Psych: negative for anxiety, depression, insomnia, tearfulness, panic attacks, hallucinations, paranoia, suicidal or homicidal ideation   Past Medical History  Diagnosis Date  . Type 2 diabetes mellitus   . Essential hypertension, benign   . History of stroke     Previously on Coumadin  . MI, old     Reported 25  . Seizure disorder   . Coronary atherosclerosis of native coronary artery     Multivessel status post CABG 2002  . History of GI bleed     Erosive gastritis and duodenitis by EGD 2002  . Mixed hyperlipidemia   . Ischemic cardiomyopathy     LVEF 40-45% March 2013  . Dementia   . CKD (chronic kidney disease) stage 4, GFR 15-29 ml/min   . CHF (congestive heart failure)   . Chronic respiratory failure with hypoxia   . Stroke   . Seizures   . Anemia of chronic renal failure, stage 4 (severe) 01/02/2015    Past Surgical History  Procedure Laterality Date  . Coronary artery bypass graft  July 2002    LIMA to LAD, SVG to OM1, SVG to OM 2, SVG to RCA  . Tee without cardioversion  01/28/2012    Procedure: TRANSESOPHAGEAL ECHOCARDIOGRAM (TEE);  Surgeon: Lelon Perla, MD;  Location: [facility] ENDOSCOPY;  Service: Cardiovascular;  Laterality: [name];  . Colonoscopy  2002  . Esophagogastroduodenoscopy  2002  . Esophagogastroduodenoscopy [name] 01/18/2014    BTD:VVOHY hiatal hernia. Abnormal gastric and duodenal bulbar mucosa of uncertain significance - status post gastric bx (chronic gastritis/H.pylori +  . Colonoscopy [name] 06/25/2014    Dr. Laural Golden: tubular adenoma, pandiverticulosis.   . Agile capsule [name] 07/06/2014    Procedure: AGILE CAPSULE;  Surgeon: Carlyon Prows  Rexene Edison, MD;  Location: [facility] ENDO SUITE;  Service: Endoscopy;  Laterality: [name];  730  . Esophagogastroduodenoscopy [name] 07/20/2014    Dr. Gala Romney: tiny gastric polyps not manipulated. minimal pigmentation of duodenal bulb likely not significant  . Givens capsule study [name] 07/20/2014    incomplete     Medications:  HOME MEDS: Prior to Admission medications   Medication Sig Start Date End Date Taking? Authorizing Provider  calcitRIOL (ROCALTROL) 0.25 MCG capsule Take 1 capsule (0.25 mcg total) by mouth daily. 05/02/14  Yes Eugenie Filler, MD  donepezil (ARICEPT) 10 MG tablet Take 10 mg by mouth at bedtime.  01/30/14  Yes Historical Provider, MD  folic acid (FOLVITE) 1 MG tablet Take 1 mg by mouth every morning.    Yes Historical Provider, MD  hydrALAZINE (APRESOLINE) 25 MG tablet Take 75 mg by mouth every 8 (eight) hours.    Yes Historical Provider, MD  insulin detemir (LEVEMIR) 100 UNIT/ML injection Inject 0.08 mLs (8 Units total) into the skin at bedtime. 05/02/14  Yes Eugenie Filler, MD  isosorbide mononitrate (IMDUR) 30 MG 24 hr tablet Take 0.5 tablets (15 mg total) by mouth every morning. 01/09/15  Yes Lezlie Octave Black, NP  levETIRAcetam (KEPPRA) 100 MG/ML solution Take 500 mg by mouth 2 (two) times daily.  09/29/14  Yes Historical Provider, MD  linagliptin (TRADJENTA) 5 MG TABS tablet Take 5 mg by mouth every morning.    Yes Historical Provider, MD  Maltodextrin-Xanthan Gum (RESOURCE THICKENUP CLEAR) POWD Take 120 g by mouth as needed. Patient taking differently: Take 1 Can by mouth daily.  01/09/15  Yes Lezlie Octave Black, NP  metoprolol succinate (TOPROL-XL) 100 MG 24 hr tablet Take 50 mg by mouth daily. Take with or immediately following a meal.   Yes Historical Provider, MD  mupirocin ointment (BACTROBAN) 2 % Apply 1 application topically 2 (two) times daily.  01/03/15  Yes Historical Provider, MD  pantoprazole (PROTONIX) 40 MG tablet Take 40 mg by mouth every morning.   Yes Historical Provider, MD  potassium chloride (K-DUR) 10 MEQ tablet Take 1 tablet (10 mEq total) by mouth daily. Patient taking differently: Take 20 mEq by mouth daily.  02/23/15  Yes Lendon Colonel, NP  pravastatin (PRAVACHOL) 20 MG tablet Take 20 mg by mouth at bedtime.    Yes Historical Provider, MD  torsemide  (DEMADEX) 20 MG tablet Take 3 tablets (60 mg total) by mouth daily. 03/09/15  Yes Lendon Colonel, NP     Allergies:  No Known Allergies  Social History:   reports that she quit smoking about 3 years ago. Her smoking use included Cigarettes. She started smoking about 54 years ago. She has a 50 pack-year smoking history. She has never used smokeless tobacco. She reports that she does not drink alcohol or use illicit drugs.  Family History: Family History  Problem Relation Age of Onset  . Stroke Father   . Diabetes type II Mother   . Colon cancer Neg Hx      Physical Exam: Filed Vitals:   05/31/15 2054 06/01/15 0509 06/01/15 0911 06/01/15 1406  BP: 135/62 104/69 119/59 120/62  Pulse: 98 95 95 93  Temp: 98.1 F (36.7 C) 98.5 F (36.9 C)  97.7 F (36.5 C)  TempSrc: Oral Oral  Oral  Resp: 18 18  18   Height:      Weight:      SpO2: 100% 97%  97%   Blood pressure 120/62, pulse 93, temperature  97.7 F (36.5 C), temperature source Oral, resp. rate 18, height 5\' 3"  (1.6 m), weight 50.7 kg (111 lb 12.4 oz), SpO2 97 %.  GEN:  Pleasant patient lying in the stretcher in no acute distress; cooperative with exam. PSYCH:  alert and oriented x4; does not appear anxious or depressed; affect is appropriate. HEENT: Mucous membranes pink and anicteric; PERRLA; EOM intact; no cervical lymphadenopathy nor thyromegaly or carotid bruit; no JVD; There were no stridor. Neck is very supple. Breasts:: Not examined CHEST WALL: No tenderness CHEST: Normal respiration, clear to auscultation bilaterally.  HEART: Regular rate and rhythm.  There are no murmur, rub, or gallops.   BACK: No kyphosis or scoliosis; no CVA tenderness ABDOMEN: soft and non-tender; no masses, no organomegaly, normal abdominal bowel sounds; no pannus; no intertriginous candida. There is no rebound and no distention. Rectal Exam: Not done EXTREMITIES: No bone or joint deformity; age-appropriate arthropathy of the hands and knees;  no edema; no ulcerations.  There is no calf tenderness. Genitalia: not examined PULSES: 2+ and symmetric SKIN: Normal hydration no rash or ulceration CNS: Cranial nerves 2-12 grossly intact no focal lateralizing neurologic deficit.  Speech is fluent; uvula elevated with phonation, facial symmetry and tongue midline. DTR are normal bilaterally, cerebella exam is intact, barbinski is negative and strengths are equaled bilaterally.  No sensory loss.   Labs on Admission:  Basic Metabolic Panel:  Recent Labs Lab 05/28/15 1534 05/29/15 0543 05/30/15 0635 05/31/15 0711 06/01/15 0647  NA 145 149* 142 140 139  K 6.6* 4.5 2.2* 2.8* 3.6  CL 126* 124* 100* 97* 101  CO2 9* 11* 29 28 26   GLUCOSE 175* 100* 188* 135* 55*  BUN 180* 162* 128* 108* 101*  CREATININE 6.75* 6.04* 4.19* 3.77* 3.09*  CALCIUM 8.7* 7.9* 6.5* 6.2* 6.3*  PHOS  --   --  4.4  --   --    Liver Function Tests:  Recent Labs Lab 05/28/15 1149 05/29/15 0543  AST 22 28  ALT 17 17  ALKPHOS 119 97  BILITOT 0.5 0.4  PROT 9.5* 8.0  ALBUMIN 3.2* 2.8*   CBC:  Recent Labs Lab 05/28/15 1149 05/30/15 0635 05/30/15 2020  WBC 9.3 9.1  --   NEUTROABS 8.3*  --   --   HGB 7.9* 6.8* 9.4*  HCT 26.3* 21.5* 28.7*  MCV 91.6 84.3  --   PLT 288 243  --    Cardiac Enzymes:  Recent Labs Lab 05/28/15 1149  TROPONINI 0.07*    CBG:  Recent Labs Lab 05/31/15 2057 06/01/15 0732 06/01/15 0821 06/01/15 0940 06/01/15 1136  GLUCAP 103* 49* 54* 118* 143*   Assessment/Plan  Present on Admission:  . Acute on chronic renal failure . Uremia . Hyperkalemia . Elevated troponin   PLAN:Hyperkalemia -Potassium down to 4.5 today after insulin and Kayexalate administered yesterday. -She has hypokalemia now at 2.6 mE/L and will need replacement.  -Continue to monitor. Plan to d/c tomorrow if OK with nephrology.  He recommended d/c on no K   Acute on chronic kidney disease stage IV -Creatinine is improving. -Acute component  likely related to prerenal azotemia/ATN. -Appreciate nephrology input and recommendations.  Metabolic acidosis -Secondary to advanced renal failure. No further bicarb given by nephrology.   Hypernatremia -Likely secondary to lack of free water, to receive D5W.  Questionable C. difficile colitis -Patient has had no symptoms of C. difficile including diarrhea or abdominal pain. -Patient received Kayexalate overnight for treatment of her hyperkalemia which resulted in  expected diarrhea. I am assuming because of diarrhea C. difficile PCR was checked overnight and has resulted positive. -I have discussed this case with infectious diseases, Dr. Graylon Good. We presume that this patient is colonized with C. difficile and has diarrhea because of Kayexalate, continue isolation but does not need treatment at this time.  Normocytic anemia -Secondary to chronic disease from chronic renal failure. S/p PRBC now 9.4 grams. On chronic Aranesp.  IV Heme given by Nephrology.  Elevated troponin -Likely secondary to severe renal insufficiency. No chest pain or shortness of breath to indicate acute coronary syndrome. -No further cardiac workup anticipated.  Diabetes mellitus -Fair control, continue current regimen  Other plans as per orders.  Code Status: FULL Haskel Khan, MD. [facility] Pager [phone] 7pm to 7am.  06/01/2015, 3:06 PM

## 2015-06-01 NOTE — Progress Notes (Signed)
Pt lost IV access. Pt has poor venous access. Per MD it is okay to D/C fluids and not restart an IV.

## 2015-06-01 NOTE — Progress Notes (Signed)
Pt. CBG 49. Pt offered 4 oz of regular soda. Will recheck patients CBG. Pt asymptomatic will continue to monitor patient.

## 2015-06-01 NOTE — Progress Notes (Signed)
Pt CBG 54. Will offer patient 4oz regular soda and recheck CBG.

## 2015-06-01 NOTE — Progress Notes (Signed)
Pt CBG 118. Will continue to monitor patient throughout the shift.

## 2015-06-01 NOTE — Progress Notes (Signed)
CRITICAL VALUE ALERT  Critical value received: Calcium 6.3  Date of notification: 06/01/2015  Time of notification:  0747  Critical value read back:Yes  Nurse who received alert:  Vista Deck  MD notified (1st page):  Dr. Marin Comment.  Time of first page:  [phone]

## 2015-06-01 NOTE — Progress Notes (Signed)
Inpatient Diabetes Program Recommendations  AACE/ADA: New Consensus Statement on Inpatient Glycemic Control (2013)  Target Ranges:  Prepandial:   less than 140 mg/dL      Peak postprandial:   less than 180 mg/dL (1-2 hours)      Critically ill patients:  140 - 180 mg/dL   Results for SABREA, SANKEY (MRN [id number]) as of 06/01/2015 13:30  Ref. Range 06/01/2015 07:32 06/01/2015 08:21 06/01/2015 09:40 06/01/2015 11:36  Glucose-Capillary Latest Ref Range: 65-99 mg/dL 49 (L) 54 (L) 118 (H) 143 (H)   Please consider reducing Levemir to 6 units q HS and decrease Novolog meal coverage to 2 units tid with meals-Hold if patient eats less than 50%.   Thanks, Adah Perl, RN, BC-ADM Inpatient Diabetes Coordinator Pager [phone] (8a-5p)

## 2015-06-01 NOTE — Care Management (Signed)
Important Message  Patient Details  Name: [name] MRN: [id number] Date of Birth: 01/27/1941   Medicare Important Message Given:  Yes-second notification given    Joylene Draft, RN 06/01/2015, 10:23 AM

## 2015-06-01 NOTE — Care Management Note (Signed)
Case Management Note  Patient Details  Name: [name] MRN: [id number] Date of Birth: 1941/04/26  Subjective/Objective:                    Action/Plan:   Expected Discharge Date:  06/01/15               Expected Discharge Plan:  Max  In-House Referral:  NA  Discharge planning Services  CM Consult  Post Acute Care Choice:  Resumption of Svcs/PTA Provider Choice offered to:  NA  DME Arranged:    DME Agency:     HH Arranged:  RN [place] Agency:  [place]  Status of Service:  Completed, signed off  Medicare Important Message Given:  Yes-second notification given Date Medicare IM Given:    Medicare IM give by:    Date Additional Medicare IM Given:    Additional Medicare Important Message give by:     If discussed at [place] of Stay Meetings, dates discussed:    Additional Comments: Anticipate discharge within 24 hours with resumption of [facility] RN (per family choice). [name] of [facility] is aware and will collect the pts information from the chart. [place] services to start within 48 hours of discharge. No new DME needs noted. Pts family and pts nurse aware of discharge arrangements. [name], RN 06/01/2015, 10:23 AM

## 2015-06-01 NOTE — Progress Notes (Signed)
EPaged MD. Critical lab received from [facility]. Calcium 6.3

## 2015-06-01 NOTE — Progress Notes (Signed)
Subjective: Patient offers no complaints. She denies any nausea or vomiting. Presently she is asymptomatic. Objective: Vital signs in last 24 hours: Temp:  [98.1 F (36.7 C)-98.5 F (36.9 C)] 98.5 F (36.9 C) (07/08 0509) Pulse Rate:  [95-98] 95 (07/08 0911) Resp:  [18] 18 (07/08 0509) BP: (104-135)/(59-69) 119/59 mmHg (07/08 0911) SpO2:  [97 %-100 %] 97 % (07/08 0509)  Intake/Output from previous day: 07/07 0701 - 07/08 0700 In: 2318.3 [I.V.:2201.3; IV Piggyback:117] Out: 750 [Urine:750] Intake/Output this shift:     Recent Labs  05/30/15 0635 05/30/15 2020  HGB 6.8* 9.4*    Recent Labs  05/30/15 0635 05/30/15 2020  WBC 9.1  --   RBC 2.55*  --   HCT 21.5* 28.7*  PLT 243  --     Recent Labs  05/31/15 0711 06/01/15 0647  NA 140 139  K 2.8* 3.6  CL 97* 101  CO2 28 26  BUN 108* 101*  CREATININE 3.77* 3.09*  GLUCOSE 135* 55*  CALCIUM 6.2* 6.3*   No results for input([name]): LABPT, INR in the last 72 hours.  Generally patient is more alert today and she doesn't seem to be in any apparent distress. Chest is clear to auscultation Heart exam is regular rate and rhythm no murmur Abdomen soft positive bowel sounds Extremities no edema  Assessment/Plan: Problem #1 hypokalemia: Patient on potassium supplement her potassium is normalized. Problem #2 acute kidney injury: . Thought to be secondary to prerenal/ATN. Her BUN and creatinine continue to improve.. Patient is nonoliguric. Problem #3 chronic renal failure stage IV : Etiology was thought to be secondary to diabetes/hypertension/recurrent acute kidney injury Problem #4 metabolic acidosis: Patient on sodium bicarbonate her CO2 is normal. Problem #5 anemia: Her hemoglobin is low.  Her iron saturation is 8% and ferritin is 308. Patient has received IVIG and blood transfusion. Her hemoglobin has improved Problem #6 metabolic bone disease: Her calcium is within our target goal Problem #7 history of CVA Problem #8  hypernatremia: Her sodium is normal. Problem #9 C. difficile colitis Plan:1] we'll change IV fluid to half normal saline with 10 mEq of KCl at 125 mL per hour We'll check her basic metabolic panel in the morning.   [name] 06/01/2015, 10:41 AM

## 2015-06-02 LAB — BASIC METABOLIC PANEL
Anion gap: 12 (ref 5–15)
BUN: 92 mg/dL — AB (ref 6–20)
CO2: 24 mmol/L (ref 22–32)
Calcium: 6.6 mg/dL — ABNORMAL LOW (ref 8.9–10.3)
Chloride: 101 mmol/L (ref 101–111)
Creatinine, Ser: 2.89 mg/dL — ABNORMAL HIGH (ref 0.44–1.00)
GFR calc Af Amer: 17 mL/min — ABNORMAL LOW (ref >60)
GFR, EST NON AFRICAN AMERICAN: 15 mL/min — AB (ref >60)
Glucose, Bld: 108 mg/dL — ABNORMAL HIGH (ref 65–99)
POTASSIUM: 3.3 mmol/L — AB (ref 3.5–5.1)
Sodium: 137 mmol/L (ref 135–145)

## 2015-06-02 LAB — GLUCOSE, CAPILLARY
GLUCOSE-CAPILLARY: 99 mg/dL (ref 65–99)
GLUCOSE-CAPILLARY: 236 mg/dL — AB (ref 65–99)
Glucose-Capillary: 82 mg/dL (ref 65–99)

## 2015-06-02 MED ORDER — POTASSIUM CHLORIDE IN NACL 20-0.45 MEQ/L-% IV SOLN
INTRAVENOUS | Status: DC
  Administered 2015-06-02: 16:00:00 via INTRAVENOUS
  Filled 2015-06-02 (×5): qty 1000

## 2015-06-02 NOTE — Progress Notes (Signed)
Subjective: Patient offers no complaints. She denies any nausea or vomiting. Presently she is asymptomatic. Objective: Vital signs in last 24 hours: Temp:  [97.7 F (36.5 C)-98.4 F (36.9 C)] 98.4 F (36.9 C) (07/09 0548) Pulse Rate:  [93-99] 99 (07/09 0911) Resp:  [17-18] 17 (07/09 0548) BP: (114-125)/(55-75) 122/75 mmHg (07/09 0911) SpO2:  [97 %] 97 % (07/09 0548)  Intake/Output from previous day: 07/08 0701 - 07/09 0700 In: 240 [P.O.:240] Out: 3 [Urine:2; Stool:1] Intake/Output this shift:     Recent Labs  05/30/15 2020  HGB 9.4*    Recent Labs  05/30/15 2020  HCT 28.7*    Recent Labs  06/01/15 0647 06/02/15 0709  NA 139 137  K 3.6 3.3*  CL 101 101  CO2 26 24  BUN 101* 92*  CREATININE 3.09* 2.89*  GLUCOSE 55* 108*  CALCIUM 6.3* 6.6*   No results for input([name]): LABPT, INR in the last 72 hours.  Generally patient is more alert today and she doesn't seem to be in any apparent distress. Chest is clear to auscultation Heart exam is regular rate and rhythm no murmur Abdomen soft positive bowel sounds Extremities no edema  Assessment/Plan: Problem #1 hypokalemia: Patient on potassium supplement her potassium has declined again after the dose of potassium is decreased and loss of iv access Problem #2 acute kidney injury: . Thought to be secondary to prerenal/ATN. Her BUN and creatinine continue to improve.. Patient is nonoliguric.Still above her base line Problem #3 chronic renal failure stage IV : Etiology was thought to be secondary to diabetes/hypertension/recurrent acute kidney injury Problem #4 metabolic acidosis: Patient on sodium bicarbonate her CO2 is normal. Problem #5 anemia: Her hemoglobin is low. Patient [name]/p blood transffussion. Problem #6 metabolic bone disease: Her calcium and his phosphorus is within our target goal Problem #7 history of CVA Problem #8 hypernatremia: Her sodium is normal. Problem #9 C. difficile colitis Plan:1] we'll change IV  fluid to half normal saline with 64mEq of KCl at 75 mL per hour 2]We'll check her basic metabolic panel in the morning.   [name] 06/02/2015, 9:21 AM

## 2015-06-02 NOTE — Progress Notes (Signed)
[facility] PROGRESS NOTE  [name] NFA:[id number] DOB: 25-Dec-1940    PCP:   [name], MD   HPI:  [name] is an 74 y.o. female with hx of CKD 4, HTN, DM, dementia, seizure disorder, hx of GI bleed, anemia of chronic disease and CKD, admitted for altered mental status, queried uremia, and hyperkalemia. She has been seen by nephrology, and has been stable, but she does have confusions. Her K is now low. Her Hb is 6.8 g per dL today, she has chronic anemia, and seems to be tolerating it well. She was given one unit of PRBC, and her Hb rose to 9.4 g per dL. Reviewed of her record showed she has been getting Aranesp every Tuesday. Dr Mauri Brooklyn gave her Faraheme IV. She is feeling better  Still not able to be discharged today.  Dr Lowanda Foster gave IVF with K today.   Rewiew of Systems:  Constitutional: Negative for malaise, fever and chills. No significant weight loss or weight gain Eyes: Negative for eye pain, redness and discharge, diplopia, visual changes, or flashes of light. ENMT: Negative for ear pain, hoarseness, nasal congestion, sinus pressure and sore throat. No headaches; tinnitus, drooling, or problem swallowing. Cardiovascular: Negative for chest pain, palpitations, diaphoresis, dyspnea and peripheral edema. ; No orthopnea, PND Respiratory: Negative for cough, hemoptysis, wheezing and stridor. No pleuritic chestpain. Gastrointestinal: Negative for nausea, vomiting, diarrhea, constipation, abdominal pain, melena, blood in stool, hematemesis, jaundice and rectal bleeding.    Genitourinary: Negative for frequency, dysuria, incontinence,flank pain and hematuria; Musculoskeletal: Negative for back pain and neck pain. Negative for swelling and trauma.;  Skin: . Negative for pruritus, rash, abrasions, bruising and skin lesion.; ulcerations Neuro: Negative for headache, lightheadedness and neck stiffness. Negative for weakness, altered level of consciousness ,  altered mental status, extremity weakness, burning feet, involuntary movement, seizure and syncope.  Psych: negative for anxiety, depression, insomnia, tearfulness, panic attacks, hallucinations, paranoia, suicidal or homicidal ideation   Past Medical History  Diagnosis Date  . Type 2 diabetes mellitus   . Essential hypertension, benign   . History of stroke     Previously on Coumadin  . MI, old     Reported 45  . Seizure disorder   . Coronary atherosclerosis of native coronary artery     Multivessel status post CABG 2002  . History of GI bleed     Erosive gastritis and duodenitis by EGD 2002  . Mixed hyperlipidemia   . Ischemic cardiomyopathy     LVEF 40-45% March 2013  . Dementia   . CKD (chronic kidney disease) stage 4, GFR 15-29 ml/min   . CHF (congestive heart failure)   . Chronic respiratory failure with hypoxia   . Stroke   . Seizures   . Anemia of chronic renal failure, stage 4 (severe) 01/02/2015    Past Surgical History  Procedure Laterality Date  . Coronary artery bypass graft  July 2002    LIMA to LAD, SVG to OM1, SVG to OM 2, SVG to RCA  . Tee without cardioversion  01/28/2012    Procedure: TRANSESOPHAGEAL ECHOCARDIOGRAM (TEE);  Surgeon: Lelon Perla, MD;  Location: [facility] ENDOSCOPY;  Service: Cardiovascular;  Laterality: [name];  . Colonoscopy  2002  . Esophagogastroduodenoscopy  2002  . Esophagogastroduodenoscopy [name] 01/18/2014    ION:GEXBM hiatal hernia. Abnormal gastric and duodenal bulbar mucosa of uncertain significance - status post gastric bx (chronic gastritis/H.pylori +  . Colonoscopy [name] 06/25/2014    Dr. Laural Golden: tubular adenoma,  pandiverticulosis.   . Agile capsule [name] 07/06/2014    Procedure: AGILE CAPSULE;  Surgeon: Danie Binder, MD;  Location: [facility] ENDO SUITE;  Service: Endoscopy;  Laterality: [name];  730  . Esophagogastroduodenoscopy [name] 07/20/2014    Dr. Gala Romney: tiny gastric polyps not manipulated. minimal pigmentation of duodenal bulb likely not  significant  . Givens capsule study [name] 07/20/2014    incomplete    Medications:  HOME MEDS: Prior to Admission medications   Medication Sig Start Date End Date Taking? Authorizing Provider  calcitRIOL (ROCALTROL) 0.25 MCG capsule Take 1 capsule (0.25 mcg total) by mouth daily. 05/02/14  Yes Eugenie Filler, MD  donepezil (ARICEPT) 10 MG tablet Take 10 mg by mouth at bedtime.  01/30/14  Yes Historical Provider, MD  folic acid (FOLVITE) 1 MG tablet Take 1 mg by mouth every morning.    Yes Historical Provider, MD  hydrALAZINE (APRESOLINE) 25 MG tablet Take 75 mg by mouth every 8 (eight) hours.    Yes Historical Provider, MD  insulin detemir (LEVEMIR) 100 UNIT/ML injection Inject 0.08 mLs (8 Units total) into the skin at bedtime. 05/02/14  Yes Eugenie Filler, MD  isosorbide mononitrate (IMDUR) 30 MG 24 hr tablet Take 0.5 tablets (15 mg total) by mouth every morning. 01/09/15  Yes Lezlie Octave Black, NP  levETIRAcetam (KEPPRA) 100 MG/ML solution Take 500 mg by mouth 2 (two) times daily.  09/29/14  Yes Historical Provider, MD  linagliptin (TRADJENTA) 5 MG TABS tablet Take 5 mg by mouth every morning.    Yes Historical Provider, MD  Maltodextrin-Xanthan Gum (RESOURCE THICKENUP CLEAR) POWD Take 120 g by mouth as needed. Patient taking differently: Take 1 Can by mouth daily.  01/09/15  Yes Lezlie Octave Black, NP  metoprolol succinate (TOPROL-XL) 100 MG 24 hr tablet Take 50 mg by mouth daily. Take with or immediately following a meal.   Yes Historical Provider, MD  mupirocin ointment (BACTROBAN) 2 % Apply 1 application topically 2 (two) times daily.  01/03/15  Yes Historical Provider, MD  pantoprazole (PROTONIX) 40 MG tablet Take 40 mg by mouth every morning.   Yes Historical Provider, MD  potassium chloride (K-DUR) 10 MEQ tablet Take 1 tablet (10 mEq total) by mouth daily. Patient taking differently: Take 20 mEq by mouth daily.  02/23/15  Yes Lendon Colonel, NP  pravastatin (PRAVACHOL) 20 MG tablet Take 20 mg by  mouth at bedtime.    Yes Historical Provider, MD  torsemide (DEMADEX) 20 MG tablet Take 3 tablets (60 mg total) by mouth daily. 03/09/15  Yes Lendon Colonel, NP     Allergies:  No Known Allergies  Social History:   reports that she quit smoking about 3 years ago. Her smoking use included Cigarettes. She started smoking about 54 years ago. She has a 50 pack-year smoking history. She has never used smokeless tobacco. She reports that she does not drink alcohol or use illicit drugs.  Family History: Family History  Problem Relation Age of Onset  . Stroke Father   . Diabetes type II Mother   . Colon cancer Neg Hx      Physical Exam: Filed Vitals:   06/02/15 0548 06/02/15 0911 06/02/15 1336 06/02/15 1424  BP: 125/74 122/75 106/52 120/58  Pulse: 99 99  100  Temp: 98.4 F (36.9 C)   98.5 F (36.9 C)  TempSrc: Oral   Oral  Resp: 17   18  Height:      Weight:  SpO2: 97%   98%   Blood pressure 120/58, pulse 100, temperature 98.5 F (36.9 C), temperature source Oral, resp. rate 18, height 5\' 3"  (1.6 m), weight 50.7 kg (111 lb 12.4 oz), SpO2 98 %.  GEN:  Pleasant  patient lying in the stretcher in no acute distress; cooperative with exam. PSYCH:  alert and oriented x4; does not appear anxious or depressed; affect is appropriate. HEENT: Mucous membranes pink and anicteric; PERRLA; EOM intact; no cervical lymphadenopathy nor thyromegaly or carotid bruit; no JVD; There were no stridor. Neck is very supple. Breasts:: Not examined CHEST WALL: No tenderness CHEST: Normal respiration, clear to auscultation bilaterally.  HEART: Regular rate and rhythm.  There are no murmur, rub, or gallops.   BACK: No kyphosis or scoliosis; no CVA tenderness ABDOMEN: soft and non-tender; no masses, no organomegaly, normal abdominal bowel sounds; no pannus; no intertriginous candida. There is no rebound and no distention. Rectal Exam: Not done EXTREMITIES: No bone or joint deformity; age-appropriate  arthropathy of the hands and knees; no edema; no ulcerations.  There is no calf tenderness. Genitalia: not examined PULSES: 2+ and symmetric SKIN: Normal hydration no rash or ulceration CNS: Cranial nerves 2-12 grossly intact no focal lateralizing neurologic deficit.  Speech is fluent; uvula elevated with phonation, facial symmetry and tongue midline. DTR are normal bilaterally, cerebella exam is intact, barbinski is negative and strengths are equaled bilaterally.  No sensory loss.   Labs on Admission:  Basic Metabolic Panel:  Recent Labs Lab 05/29/15 0543 05/30/15 0635 05/31/15 0711 06/01/15 0647 06/02/15 0709  NA 149* 142 140 139 137  K 4.5 2.2* 2.8* 3.6 3.3*  CL 124* 100* 97* 101 101  CO2 11* 29 28 26 24   GLUCOSE 100* 188* 135* 55* 108*  BUN 162* 128* 108* 101* 92*  CREATININE 6.04* 4.19* 3.77* 3.09* 2.89*  CALCIUM 7.9* 6.5* 6.2* 6.3* 6.6*  PHOS  --  4.4  --   --   --    Liver Function Tests:  Recent Labs Lab 05/28/15 1149 05/29/15 0543  AST 22 28  ALT 17 17  ALKPHOS 119 97  BILITOT 0.5 0.4  PROT 9.5* 8.0  ALBUMIN 3.2* 2.8*   CBC:  Recent Labs Lab 05/28/15 1149 05/30/15 0635 05/30/15 2020  WBC 9.3 9.1  --   NEUTROABS 8.3*  --   --   HGB 7.9* 6.8* 9.4*  HCT 26.3* 21.5* 28.7*  MCV 91.6 84.3  --   PLT 288 243  --    Cardiac Enzymes:  Recent Labs Lab 05/28/15 1149  TROPONINI 0.07*    CBG:  Recent Labs Lab 06/01/15 1653 06/01/15 2101 06/02/15 0730 06/02/15 1123 06/02/15 1622  GLUCAP 150* 103* 99 236* 82    EKG: Independently reviewed.    Assessment/Plan Present on Admission:  . Acute on chronic renal failure . Uremia . Hyperkalemia . Elevated troponin  PLAN:  Hypokalemia:  Admitted with elevated K, now K of 3.3.  Will d/c to home when OK with Dr Zadie Rhine.  Acute on chronic kidney disease stage IV -Creatinine is improving. -Acute component likely related to prerenal azotemia/ATN. -Appreciate nephrology input and  recommendations.  Metabolic acidosis -Secondary to advanced renal failure. No further bicarb given by nephrology.   Hypernatremia -Likely secondary to lack of free water, to receive D5W.  Questionable C. difficile colitis -Patient has had no symptoms of C. difficile including diarrhea or abdominal pain. -Patient received Kayexalate overnight for treatment of her hyperkalemia which resulted in expected diarrhea.  I am assuming because of diarrhea C. difficile PCR was checked overnight and has resulted positive. -I have discussed this case with infectious diseases, Dr. Graylon Good. We presume that this patient is colonized with C. difficile and has diarrhea because of Kayexalate, continue isolation but does not need treatment at this time.  Normocytic anemia -Secondary to chronic disease from chronic renal failure. S/p PRBC now 9.4 grams. On chronic Aranesp.  IV Heme given by Nephrology.  Elevated troponin -Likely secondary to severe renal insufficiency. No chest pain or shortness of breath to indicate acute coronary syndrome. -No further cardiac workup anticipated.  Diabetes mellitus -Fair control, continue current regimen  Other plans as per orders.  Code Status: FULL Haskel Khan, MD. [facility] Pager [phone] 7pm to 7am.  06/02/2015, 5:31 PM

## 2015-06-03 LAB — GLUCOSE, CAPILLARY
GLUCOSE-CAPILLARY: 74 mg/dL (ref 65–99)
GLUCOSE-CAPILLARY: 117 mg/dL — AB (ref 65–99)
Glucose-Capillary: 196 mg/dL — ABNORMAL HIGH (ref 65–99)

## 2015-06-03 LAB — BASIC METABOLIC PANEL
ANION GAP: 9 (ref 5–15)
BUN: 87 mg/dL — ABNORMAL HIGH (ref 6–20)
CHLORIDE: 105 mmol/L (ref 101–111)
CO2: 22 mmol/L (ref 22–32)
Calcium: 6.6 mg/dL — ABNORMAL LOW (ref 8.9–10.3)
Creatinine, Ser: 3.01 mg/dL — ABNORMAL HIGH (ref 0.44–1.00)
GFR calc non Af Amer: 14 mL/min — ABNORMAL LOW (ref >60)
GFR, EST AFRICAN AMERICAN: 17 mL/min — AB (ref >60)
Glucose, Bld: 78 mg/dL (ref 65–99)
Potassium: 3.8 mmol/L (ref 3.5–5.1)
Sodium: 136 mmol/L (ref 135–145)

## 2015-06-03 MED ORDER — INSULIN DETEMIR 100 UNIT/ML ~~LOC~~ SOLN: 6.0000 [IU] | Freq: Every day | SUBCUTANEOUS | Status: DC

## 2015-06-03 MED ORDER — INSULIN ASPART 100 UNIT/ML ~~LOC~~ SOLN: 2.0000 [IU] | Freq: Three times a day (TID) | SUBCUTANEOUS | Status: DC

## 2015-06-03 NOTE — Discharge Summary (Signed)
Physician Discharge Summary  [name] ZOX:[id number] DOB: [birth year] DOA: 05/28/2015  PCP: [name], MD  Admit date: 05/28/2015 Discharge date: 06/03/2015  Time spent: 35 minutes  Recommendations for Outpatient Follow-up:  1. Follow up with Dr Patric Dykes next week. 2. Follow up with PCP in one week.   Discharge Diagnoses:  Principal Problem:   Uremia Active Problems:   Diabetes mellitus   Elevated troponin   Hyperkalemia   Acute on chronic renal failure   CKD stage 4 due to type 2 diabetes mellitus   Normocytic anemia   Discharge Condition: improved.   Diet recommendation:  As tolerated.   Filed Weights   05/28/15 1119 05/28/15 1455 05/29/15 0500  Weight: 45.36 kg (100 lb) 47.3 kg (104 lb 4.4 oz) 50.7 kg (111 lb 12.4 oz)    History of present illness: Patient was admitted to the hospital for weakness, ataxia, fatigue, found to have AKI on CKD and hyperkalemia by Dr Jerilee Hoh on May 28, 2015.  As per her prior H and P:  " Patient is a 74 year old woman known for a recent hospitalization with hyperkalemia. Past medical history significant for chronic kidney disease stage IV, diabetes, hypertension, prior history of CVA, ischemic cardiomyopathy among other things who presents to the hospital today with the above-mentioned complaints. Although she is able to answer short and direct questions, she is unable to provide full history and hence it is obtained from her daughter [name] at bedside. [name] states that about 3 or 4 days ago she started having issues with walking where she would be very weak and had difficulty standing up and felt unbalanced while ambulating. She also noticed that she has a blank gaze ;will be talking and in the middle of a sentence her eyes will glaze over. She has not had any nausea or vomiting, no dark stools, no chest pain, no shortness of breath. She was brought to the emergency department today where labs are mostly significant for a creatinine of 7.16, a  BUN of 222, a potassium of 7.4. I have independently reviewed her EKG which shows normal sinus rhythm decreased voltage, occasional PVC no signs of ischemia, no peaked T waves. We have been asked to admit her for further evaluation and management.  Hospital Course: [name] is an 74 y.o. female with hx of CKD 4, HTN, DM, dementia, seizure disorder, hx of GI bleed, anemia of chronic disease and CKD, admitted for altered mental status, queried uremia, and hyperkalemia. She has been seen by nephrology, and has been stable, but she does have confusions.Dr Burnett Sheng thought some of her symptoms were uremia, though with a BUN of 222, she wasn't completely confused.  He managed her IVF, and corrected her hyperkalemia.  Actually, it was overcorrected, and she had hypokalemia,  Requiring repletion over several days.  For her metabolic acidosis, she was given IV bicarb.  Her Cr has been improving and she went from a Cr of 7.16 down to 3.01.  She was found to have some diarrhea, after Kayexalate given, and C diff was positive, but it was felt that she did not have C diff infection, so after discussing with Dr Elmer Picker by Dr Jerilee Hoh, it was not treated.  Her diarrhea resolved.  She also had anemia, and though she was getting Aranesp weekly, her iron saturation was inadequate.  She was given Faraheme IV by Dr Burnett Sheng, and I also gave her 1 units of PRBCs, raising her Hb from 6.8 grams per dL to 9.4  g/dL.  It became clear that her major problem is that she is not eating or drinking adequately, and had required IVF to sustain her life.  I had therefore spoken to her family (daughter and sister), and explained that options were to place a PEG tube, or if not, consider palliative care for her, and after discussion, family will think about these options.  In the interim, her family would like to take her home and try to feed her orally to try to keep up with her fluid.  Dr Burnett Sheng feels that we should not give her any  potassium supplement upon discharged.  He will follow up with her next week, and family was told that she should follow up with her PCP as well.  She is at risk to be hospitalized again, especially if she cannot keep up with her oral intake.  Family understands and agreed.     Consultations:  Nephrology consultation:  Dr Jacolyn Reedy.   Discharge Exam: Filed Vitals:   06/03/15 1350  BP: 115/61  Pulse: 96  Temp:   Resp: 16    Discharge Instructions   Discharge Instructions    Diet - low sodium heart healthy    Complete by:  As directed      Increase activity slowly    Complete by:  As directed           Current Discharge Medication List    START taking these medications   Details  insulin aspart (NOVOLOG) 100 UNIT/ML injection Inject 2 Units into the skin 3 (three) times daily with meals. Qty: 10 mL, Refills: 11      CONTINUE these medications which have CHANGED   Details  insulin detemir (LEVEMIR) 100 UNIT/ML injection Inject 0.06 mLs (6 Units total) into the skin at bedtime. Qty: 10 mL, Refills: 11      CONTINUE these medications which have NOT CHANGED   Details  calcitRIOL (ROCALTROL) 0.25 MCG capsule Take 1 capsule (0.25 mcg total) by mouth daily. Qty: 31 capsule, Refills: 0    donepezil (ARICEPT) 10 MG tablet Take 10 mg by mouth at bedtime.     folic acid (FOLVITE) 1 MG tablet Take 1 mg by mouth every morning.     hydrALAZINE (APRESOLINE) 25 MG tablet Take 75 mg by mouth every 8 (eight) hours.     isosorbide mononitrate (IMDUR) 30 MG 24 hr tablet Take 0.5 tablets (15 mg total) by mouth every morning. Qty: 30 tablet, Refills: 0    levETIRAcetam (KEPPRA) 100 MG/ML solution Take 500 mg by mouth 2 (two) times daily.     linagliptin (TRADJENTA) 5 MG TABS tablet Take 5 mg by mouth every morning.     Maltodextrin-Xanthan Gum (RESOURCE THICKENUP CLEAR) POWD Take 120 g by mouth as needed.    metoprolol succinate (TOPROL-XL) 100 MG 24 hr tablet Take 50 mg  by mouth daily. Take with or immediately following a meal.    mupirocin ointment (BACTROBAN) 2 % Apply 1 application topically 2 (two) times daily.     pantoprazole (PROTONIX) 40 MG tablet Take 40 mg by mouth every morning.    pravastatin (PRAVACHOL) 20 MG tablet Take 20 mg by mouth at bedtime.       STOP taking these medications     potassium chloride (K-DUR) 10 MEQ tablet      torsemide (DEMADEX) 20 MG tablet        No Known Allergies Follow-up Information    Follow up with [facility].   Contact information:   [address] [place] [address] [phone]       Follow up In 4 weeks.       The results of significant diagnostics from this hospitalization (including imaging, microbiology, ancillary and laboratory) are listed below for reference.    Significant Diagnostic Studies: Dg Chest Port 1 View  05/28/2015   CLINICAL DATA:  Weakness and cough for 3 days, low rectal temperature  EXAM: PORTABLE CHEST - 1 VIEW  COMPARISON:  Portable exam 1203 hours compared to 04/25/2015  FINDINGS: Enlargement of cardiac silhouette post CABG.  Mediastinal contours and pulmonary vascularity normal.  Atherosclerotic calcification aorta.  Lungs clear.  No pleural effusion or pneumothorax.  IMPRESSION: Enlargement of cardiac silhouette post CABG.  No acute abnormalities.   Electronically Signed   By: Lavonia Dana M.D.   On: 05/28/2015 12:18    Microbiology: Recent Results (from the past 240 hour(s))  MRSA PCR Screening     Status: None   Collection Time: 05/28/15  2:35 PM  Result Value Ref Range Status   MRSA by PCR NEGATIVE NEGATIVE Final    Comment:        The GeneXpert MRSA Assay (FDA approved for NASAL specimens only), is one component of a comprehensive MRSA colonization surveillance program. It is not intended to diagnose MRSA infection nor to guide or monitor treatment for MRSA infections.   Clostridium Difficile by PCR (not at [facility])     Status:  Abnormal   Collection Time: 05/28/15  9:34 PM  Result Value Ref Range Status   C difficile by pcr POSITIVE (A) NEGATIVE Final    Comment: CRITICAL RESULT CALLED TO, READ BACK BY AND VERIFIED WITH: Lynwood Dawley [id number] ON [id number] BY FORSYTH K      Labs: Basic Metabolic Panel:  Recent Labs Lab 05/30/15 0635 05/31/15 0711 06/01/15 0647 06/02/15 0709 06/03/15 0629  NA 142 140 139 137 136  K 2.2* 2.8* 3.6 3.3* 3.8  CL 100* 97* 101 101 105  CO2 29 28 26 24 22   GLUCOSE 188* 135* 55* 108* 78  BUN 128* 108* 101* 92* 87*  CREATININE 4.19* 3.77* 3.09* 2.89* 3.01*  CALCIUM 6.5* 6.2* 6.3* 6.6* 6.6*  PHOS 4.4  --   --   --   --    Liver Function Tests:  Recent Labs Lab 05/28/15 1149 05/29/15 0543  AST 22 28  ALT 17 17  ALKPHOS 119 97  BILITOT 0.5 0.4  PROT 9.5* 8.0  ALBUMIN 3.2* 2.8*   No results for input(s): LIPASE, AMYLASE in the last 168 hours. No results for input(s): AMMONIA in the last 168 hours. CBC:  Recent Labs Lab 05/28/15 1149 05/30/15 0635 05/30/15 2020  WBC 9.3 9.1  --   NEUTROABS 8.3*  --   --   HGB 7.9* 6.8* 9.4*  HCT 26.3* 21.5* 28.7*  MCV 91.6 84.3  --   PLT 288 243  --    Cardiac Enzymes:  Recent Labs Lab 05/28/15 1149  TROPONINI 0.07*    Recent Labs  01/02/15 1125 05/28/15 1148  BNP >4500.0* 1084.0*    Recent Labs  06/09/14 1021  PROBNP 30358.0*    CBG:  Recent Labs Lab 06/02/15 1123 06/02/15 1622 06/02/15 2244 06/03/15 0745 06/03/15 1124  GLUCAP 236* 82 196* 74 117*    Signed:  Gabrielly Mccrystal  [facility] 06/03/2015, 2:08 PM

## 2015-06-03 NOTE — Plan of Care (Signed)
Problem: Discharge Progression Outcomes Goal: Other Discharge Outcomes/Goals Outcome: Completed/Met Date Met:  06/03/15 Home via EMS

## 2015-06-03 NOTE — Progress Notes (Signed)
Subjective: Patient offers no complaints.  Objective: Vital signs in last 24 hours: Temp:  [98.4 F (36.9 C)-98.7 F (37.1 C)] 98.4 F (36.9 C) (07/10 0617) Pulse Rate:  [83-100] 94 (07/10 0617) Resp:  [15-18] 15 (07/10 0617) BP: (106-127)/(52-82) 108/82 mmHg (07/10 0617) SpO2:  [96 %-98 %] 98 % (07/10 0617)  Intake/Output from previous day: 07/09 0701 - 07/10 0700 In: 720 [P.O.:720] Out: 5 [Urine:5] Intake/Output this shift:    No results for input(s): HGB in the last 72 hours. No results for input(s): WBC, RBC, HCT, PLT in the last 72 hours.  Recent Labs  06/02/15 0709 06/03/15 0629  NA 137 136  K 3.3* 3.8  CL 101 105  CO2 24 22  BUN 92* 87*  CREATININE 2.89* 3.01*  GLUCOSE 108* 78  CALCIUM 6.6* 6.6*   No results for input(s): LABPT, INR in the last 72 hours.  Generally patient is more alert today and she doesn't seem to be in any apparent distress. Chest is clear to auscultation Heart exam is regular rate and rhythm no murmur Abdomen soft positive bowel sounds Extremities no edema  Assessment/Plan: Problem #1 hypokalemia: Patient on potassium supplement her potassium has normaliz)ed Problem #2 acute kidney injury: . Thought to be secondary to prerenal/ATN. Her creatinine is slightly higher today.. Patient is nonoliguric.Still above her base line Problem #3 chronic renal failure stage IV : Etiology was thought to be secondary to diabetes/hypertension/recurrent acute kidney injury Problem #4 metabolic acidosis: Patient on sodium bicarbonate her CO2 is normal. Problem #5 anemia: Her hemoglobin is low. Patient s/p blood transffussion. She will receive another iv iron next week Problem #6 metabolic bone disease: Her calcium and his phosphorus is within our target goal Problem #7 history of CVA Problem #8 hypernatremia: Her sodium is normal. Problem #9 C. difficile colitis Plan:1] encourage po fluid intake 2] Patient may require feeding tube placement because of  poor po intake and recurrent dehydration. 3]We'll check her basic metabolic panel in the morning. 4] If discharged I will see her in 4 weeks  5] d/c ivf   Ameshia Pewitt S 06/03/2015, 9:56 AM

## 2015-06-03 NOTE — Progress Notes (Signed)
Left via EMS for discharge home, daughters at home waiting on patient per report from day nurse.

## 2015-06-04 ENCOUNTER — Other Ambulatory Visit: Payer: Self-pay | Admitting: *Deleted

## 2015-06-04 NOTE — Patient Outreach (Signed)
TOC week #1 attempted. Call to patient number listed. VM left with RNCM contact. Plan to attempt again later. [name]. [name], RN, BSN, [place] [phone]

## 2015-06-07 ENCOUNTER — Other Ambulatory Visit: Payer: Self-pay | Admitting: *Deleted

## 2015-06-07 NOTE — Patient Outreach (Signed)
TOC program started  Call to patient home, spoke with daughter, [name]. Patient was inpatient with dehydration. Daughter reports that the patient had poor appetite in hospital, they proposed putting in PEG but daughter reports they do not want to pursue PEG placement at this time.  Daughter reports since coming home, patient appetite has improved, weight is 112. She is weighing daily and monitoring for any signs and symptoms of HF especially with stopping the fluid pills.  Daughter voices understanding of symptoms to report. Home Health has been restarted, nurse has already made visit There is a follow up appointment with Primary care next week. Reviewed medications: Hospital Doctor stopped fluid pill and potassium Plan to continue Transition of Care program calls Outreach visit scheduled for later in month.  [name]. [name], RN, BSN, [place] [phone]

## 2015-06-12 ENCOUNTER — Encounter (HOSPITAL_COMMUNITY): Payer: Medicare Other | Attending: Hematology and Oncology

## 2015-06-12 ENCOUNTER — Encounter (HOSPITAL_BASED_OUTPATIENT_CLINIC_OR_DEPARTMENT_OTHER): Payer: Medicare Other

## 2015-06-12 VITALS — BP 134/48 | HR 61 | Temp 98.6°F | Resp 16

## 2015-06-12 DIAGNOSIS — I129 Hypertensive chronic kidney disease with stage 1 through stage 4 chronic kidney disease, or unspecified chronic kidney disease: Secondary | ICD-10-CM | POA: Diagnosis not present

## 2015-06-12 DIAGNOSIS — D631 Anemia in chronic kidney disease: Secondary | ICD-10-CM

## 2015-06-12 DIAGNOSIS — I252 Old myocardial infarction: Secondary | ICD-10-CM | POA: Diagnosis not present

## 2015-06-12 DIAGNOSIS — E780 Pure hypercholesterolemia: Secondary | ICD-10-CM | POA: Insufficient documentation

## 2015-06-12 DIAGNOSIS — N184 Chronic kidney disease, stage 4 (severe): Secondary | ICD-10-CM | POA: Diagnosis not present

## 2015-06-12 DIAGNOSIS — D649 Anemia, unspecified: Secondary | ICD-10-CM | POA: Insufficient documentation

## 2015-06-12 DIAGNOSIS — Z87898 Personal history of other specified conditions: Secondary | ICD-10-CM | POA: Insufficient documentation

## 2015-06-12 DIAGNOSIS — Z951 Presence of aortocoronary bypass graft: Secondary | ICD-10-CM | POA: Diagnosis not present

## 2015-06-12 DIAGNOSIS — Z79899 Other long term (current) drug therapy: Secondary | ICD-10-CM | POA: Diagnosis not present

## 2015-06-12 DIAGNOSIS — I509 Heart failure, unspecified: Secondary | ICD-10-CM | POA: Diagnosis not present

## 2015-06-12 DIAGNOSIS — Z87891 Personal history of nicotine dependence: Secondary | ICD-10-CM | POA: Insufficient documentation

## 2015-06-12 DIAGNOSIS — D509 Iron deficiency anemia, unspecified: Secondary | ICD-10-CM

## 2015-06-12 DIAGNOSIS — Z794 Long term (current) use of insulin: Secondary | ICD-10-CM | POA: Diagnosis not present

## 2015-06-12 DIAGNOSIS — Z8673 Personal history of transient ischemic attack (TIA), and cerebral infarction without residual deficits: Secondary | ICD-10-CM | POA: Diagnosis not present

## 2015-06-12 DIAGNOSIS — F0390 Unspecified dementia without behavioral disturbance: Secondary | ICD-10-CM | POA: Insufficient documentation

## 2015-06-12 DIAGNOSIS — R569 Unspecified convulsions: Secondary | ICD-10-CM | POA: Insufficient documentation

## 2015-06-12 DIAGNOSIS — E119 Type 2 diabetes mellitus without complications: Secondary | ICD-10-CM | POA: Diagnosis not present

## 2015-06-12 DIAGNOSIS — D62 Acute posthemorrhagic anemia: Secondary | ICD-10-CM

## 2015-06-12 DIAGNOSIS — I2510 Atherosclerotic heart disease of native coronary artery without angina pectoris: Secondary | ICD-10-CM | POA: Diagnosis not present

## 2015-06-12 LAB — CBC WITH DIFFERENTIAL/PLATELET
Basophils Absolute: 0 10*3/uL (ref 0.0–0.1)
Basophils Relative: 1 % (ref 0–1)
EOS ABS: 0.1 10*3/uL (ref 0.0–0.7)
EOS PCT: 2 % (ref 0–5)
HCT: 25.9 % — ABNORMAL LOW (ref 36.0–46.0)
HEMOGLOBIN: 7.9 g/dL — AB (ref 12.0–15.0)
LYMPHS ABS: 0.9 10*3/uL (ref 0.7–4.0)
LYMPHS PCT: 20 % (ref 12–46)
MCH: 27.6 pg (ref 26.0–34.0)
MCHC: 30.5 g/dL (ref 30.0–36.0)
MCV: 90.6 fL (ref 78.0–100.0)
MONO ABS: 0.6 10*3/uL (ref 0.1–1.0)
MONOS PCT: 13 % — AB (ref 3–12)
NEUTROS ABS: 2.7 10*3/uL (ref 1.7–7.7)
Neutrophils Relative %: 64 % (ref 43–77)
PLATELETS: 257 10*3/uL (ref 150–400)
RBC: 2.86 MIL/uL — ABNORMAL LOW (ref 3.87–5.11)
RDW: 17 % — ABNORMAL HIGH (ref 11.5–15.5)
WBC: 4.3 10*3/uL (ref 4.0–10.5)

## 2015-06-12 LAB — FERRITIN: FERRITIN: 528 ng/mL — AB (ref 11–307)

## 2015-06-12 MED ORDER — DARBEPOETIN ALFA 100 MCG/0.5ML IJ SOSY
80.0000 ug | PREFILLED_SYRINGE | Freq: Once | INTRAMUSCULAR | Status: AC
  Administered 2015-06-12: 80 ug via SUBCUTANEOUS

## 2015-06-12 MED ORDER — DARBEPOETIN ALFA 100 MCG/0.5ML IJ SOSY
PREFILLED_SYRINGE | INTRAMUSCULAR | Status: AC
  Filled 2015-06-12: qty 0.5

## 2015-06-12 NOTE — Progress Notes (Signed)
[name] presents today for injection per MD orders. Aranesp 62mcg administered SQ in left Abdomen. Administration without incident. Patient tolerated well.

## 2015-06-13 ENCOUNTER — Other Ambulatory Visit: Payer: Self-pay | Admitting: *Deleted

## 2015-06-13 NOTE — Patient Outreach (Signed)
TOC week #2 call attempt No answer to phone, unable to leave a message. Plan to attempt again later in week. Outreach visit scheduled for next week. [name]. [name], RN, BSN, [place] [phone]

## 2015-06-13 NOTE — Progress Notes (Signed)
Labs drawn

## 2015-06-13 NOTE — Patient Outreach (Signed)
TOC week #2 completed with patient daughter.  Subjective: Daughter [name] reports patient is doing really well, she has a good appetite and is eating 3 meals a day. [place] services continue, PT working with patient and she is ambulating, patient was able to get out of home to go  To appointments and was able to use walker. Daughter does report weight is up to 116#, denies any shortness of breath or edema.  Assessement: RNCM discussed calling cardiologist is weight trends up as patient is off of diuretic, discussed they may want patient to take fluid pill to get weight back down. Reviewed CHF zones, importance of tracking daily weights.  Plan:  Daughter to call cardiologist re: weight gain if patient continues to gain Daughter to call [facility] for any questions or concerns. Outreach visit scheduled for next week. [name]. [name], RN, BSN, [place] [phone]

## 2015-06-19 ENCOUNTER — Other Ambulatory Visit: Payer: Self-pay | Admitting: *Deleted

## 2015-06-19 ENCOUNTER — Ambulatory Visit: Payer: Medicare Other | Admitting: *Deleted

## 2015-06-19 NOTE — Patient Outreach (Signed)
Call from Patient daughter, patient needs to reschedule appointment due to conflict with patient appointment with neurologist at 2:50 Agreed to reschedule for 06/21/15 at [place]. [name], RN, BSN, [place] [phone]

## 2015-06-21 ENCOUNTER — Encounter: Payer: Self-pay | Admitting: *Deleted

## 2015-06-21 ENCOUNTER — Other Ambulatory Visit: Payer: Self-pay | Admitting: *Deleted

## 2015-06-21 NOTE — Patient Outreach (Signed)
[facility]) Care Management   06/21/2015  [name] 13-Apr-1941 [id number]  [name] is an 74 y.o. female  Subjective:  Patient states she is "feeling good" today. Patient asking daughter to go sit outside, but daughter states it is too hot to go out now Daughter checking daily weights, wt now 120# but patient eating 3 meals a day, has a great appetite.   Patient denies any symptoms of shortness of breath or bloating.   Daughter checking blood sugars daily, today it was 92, states they have not had to give the insulin as not sugars above 200.  Patient has appointments next week with Dr. Harl Bowie, labs and cancer center.  Patient saw Primary care yesterday, he was happy with patient Patient saw neuro this week no changes.  Patient still getting [place] services, nurse did call cardiologist about weight gain.    Objective:   BP 120/80 mmHg  Pulse 66  Resp 20  Wt 120 lb (54.432 kg)  SpO2 92%  Patient neatly groomed and dressed, patient alert and oriented x2. Review of Systems  Constitutional: Negative.   Eyes: Negative.   Respiratory: Negative.   Cardiovascular: Negative.   Gastrointestinal: Negative.   Genitourinary: Negative.   Musculoskeletal: Negative.   Skin: Negative.   Neurological: Negative.   Psychiatric/Behavioral: Negative.     Physical Exam  Constitutional: She appears well-developed and well-nourished.  Neck: Normal range of motion.  Cardiovascular: Normal rate and regular rhythm.   Respiratory: Effort normal and breath sounds normal.  GI: Soft. Bowel sounds are normal.  Musculoskeletal: Normal range of motion.  Neurological: She is alert.  Skin: Skin is warm and dry.  Psychiatric: She has a normal mood and affect. Her behavior is normal.    Current Medications:   Current Outpatient Prescriptions  Medication Sig Dispense Refill  . calcitRIOL (ROCALTROL) 0.25 MCG capsule Take 1 capsule (0.25 mcg total) by mouth daily. 31  capsule 0  . donepezil (ARICEPT) 10 MG tablet Take 10 mg by mouth at bedtime.     . folic acid (FOLVITE) 1 MG tablet Take 1 mg by mouth every morning.     . hydrALAZINE (APRESOLINE) 25 MG tablet Take 75 mg by mouth every 8 (eight) hours.     . insulin aspart (NOVOLOG) 100 UNIT/ML injection Inject 2 Units into the skin 3 (three) times daily with meals. 10 mL 11  . insulin detemir (LEVEMIR) 100 UNIT/ML injection Inject 0.06 mLs (6 Units total) into the skin at bedtime. 10 mL 11  . isosorbide mononitrate (IMDUR) 30 MG 24 hr tablet Take 0.5 tablets (15 mg total) by mouth every morning. 30 tablet 0  . levETIRAcetam (KEPPRA) 100 MG/ML solution Take 500 mg by mouth 2 (two) times daily.     Marland Kitchen linagliptin (TRADJENTA) 5 MG TABS tablet Take 5 mg by mouth every morning.     . Maltodextrin-Xanthan Gum (RESOURCE THICKENUP CLEAR) POWD Take 120 g by mouth as needed. (Patient taking differently: Take 1 Can by mouth daily. )    . metoprolol succinate (TOPROL-XL) 100 MG 24 hr tablet Take 50 mg by mouth daily. Take with or immediately following a meal.    . mupirocin ointment (BACTROBAN) 2 % Apply 1 application topically 2 (two) times daily.     . pantoprazole (PROTONIX) 40 MG tablet Take 40 mg by mouth every morning.    . pravastatin (PRAVACHOL) 20 MG tablet Take 20 mg by mouth at bedtime.     Marland Kitchen  potassium chloride (K-DUR) 10 MEQ tablet Take 10 mEq by mouth daily.    Marland Kitchen torsemide (DEMADEX) 20 MG tablet Take 20 mg by mouth daily.     No current facility-administered medications for this visit.    Functional Status:   In your present state of health, do you have any difficulty performing the following activities: 06/21/2015 05/28/2015  Hearing? N N  Vision? N N  Difficulty concentrating or making decisions? Tempie Donning  Walking or climbing stairs? N N  Dressing or bathing? Y Y  Doing errands, shopping? Y -  Conservation officer, nature and eating ? Y -  Using the Toilet? Y -  In the past six months, have you accidently leaked urine?  Y -  Do you have problems with loss of bowel control? Y -  Managing your Medications? Y -  Managing your Finances? Y -  Housekeeping or managing your Housekeeping? Y -    Fall/Depression Screening:    Fall Risk  06/21/2015 06/07/2015 03/22/2015 11/28/2014 11/27/2014  Falls in the past year? Yes Yes Yes No No  Number falls in past yr: - 1 1 - -  Injury with Fall? - No No - -  Risk Factor Category  - High Fall Risk High Fall Risk - -  Risk for fall due to : - History of fall(s);Impaired balance/gait;Impaired mobility Impaired mobility;History of fall(s) Impaired balance/gait;Impaired mobility Impaired balance/gait;Impaired mobility   PHQ 2/9 Scores 03/22/2015 10/16/2014 09/26/2014  PHQ - 2 Score 0 0 0    Assessment:  Reinforce diet and fluid restrictions Reinforce symptoms to call MD  Reviewed upcoming appointments   Plan:  Daughter to call Cardiologist with continued weight gain and symptoms of HF Send quarterly update to MD Will revisit 07/17/15  [name]. [name], RN, BSN, [place] [phone]  [facility] CM Care Plan Problem One        Patient Outreach from 06/21/2015 in [place] Problem One  Readmission for dehydration   Care Plan for Problem One  Active   [facility] Long Term Goal (31-90 days)  Patient will not have any hopsitalization for dehydration over the next 31 days   [facility] Long Term Goal Start Date  06/07/15   Interventions for Problem One Long Term Goal  Utilizing teachback method, reviewed diet, fluid intake and wts, reviewed when to contact MD with patient and daughter, [name]    [facility] Problem Two        Patient Outreach Telephone from 06/13/2015 in [place] Problem Two  Knowledge deficit related to new diagnosis of end stage renal disease and possible need for dialysis   Care Plan for Problem Two  Active   [facility] CM Short Term Goal #1 (0-30 days)  Patient and daughter will have formulated  questions about dialyis before next appointment with kidney doctor   [facility] CM Short Term Goal #1 Start Date  05/22/15   THN CM Short Term Goal #2 (0-30 days)  Patient and family will have knowledge of impact of dialysis to make informed decision   [facility] CM Short Term Goal #2 Start Date  05/22/15    [facility] CM Care Plan Problem Three        Patient Outreach Telephone from 04/30/2015 in [place] for Problem Three  Not Active

## 2015-06-25 ENCOUNTER — Ambulatory Visit (INDEPENDENT_AMBULATORY_CARE_PROVIDER_SITE_OTHER): Payer: Medicare Other | Admitting: Cardiology

## 2015-06-25 ENCOUNTER — Encounter: Payer: Self-pay | Admitting: Cardiology

## 2015-06-25 VITALS — BP 132/70 | HR 76 | Ht 64.0 in | Wt 123.0 lb

## 2015-06-25 DIAGNOSIS — I2510 Atherosclerotic heart disease of native coronary artery without angina pectoris: Secondary | ICD-10-CM

## 2015-06-25 DIAGNOSIS — I5042 Chronic combined systolic (congestive) and diastolic (congestive) heart failure: Secondary | ICD-10-CM | POA: Diagnosis not present

## 2015-06-25 DIAGNOSIS — I340 Nonrheumatic mitral (valve) insufficiency: Secondary | ICD-10-CM | POA: Diagnosis not present

## 2015-06-25 NOTE — Patient Instructions (Signed)
Your physician wants you to follow-up in: 6 months with Dr.Branch You will receive a reminder letter in the mail two months in advance. If you don't receive a letter, please call our office to schedule the follow-up appointment.   Your physician recommends that you continue on your current medications as directed. Please refer to the Current Medication list given to you today.    Thank you for choosing [facility] !        

## 2015-06-25 NOTE — Progress Notes (Signed)
Patient ID: [name], female   DOB: 1941-04-22, 74 y.o.   MRN: [id number]     Clinical Summary [name] is a 74 y.o.female seen today for follow up of the following medical problems.   1. Chronic systolic/diastolic heart failure  - echo 12/2013 LVEF 60-73%, grade I diastolic dysfunction  - denies any DOE, no LE edema - compliant with meds. No ACE-I due to poor renal function.  - torsemide stopped July 10,2016 at discharge for anemia and AKI/prerenal, Cr and BUN severely elevated on that admit. Improved off diuretic. Since being off has not accumulated significant fluid. Does have some weight gain family attributes to increased appetite.   2. Anemia  - multiple admits last year with severe symptomatic anemia Hgb around 5. Followed by GI and oncology. Currently on aranesp.   3. CAD  - prior CABG in 2002, LVEF has been stable at 40-45% between 2013 and recent study 12/2013  - mild troponin elevated at 0.76 during recent admit 12/2013 thought to be demand ischemia, had been on antiplatelet at home.  - repeat mild trop elevation 05/2014 with repeat admission with severe anemia. Transient elevation of trop to 0.48.  - no recent chest pain   4. Severe MR  - functional MR by echo 12/2013, poor candidate at this time for consideration for surgery due to overall comorbidities including dementia.  - denies any new symptoms   5. Dementia  - followed by primary  - reports she lives with daughter who helps with her care and making sure she takes her medicines Past Medical History  Diagnosis Date  . Type 2 diabetes mellitus   . Essential hypertension, benign   . History of stroke     Previously on Coumadin  . MI, old     Reported 74  . Seizure disorder   . Coronary atherosclerosis of native coronary artery     Multivessel status post CABG 2002  . History of GI bleed     Erosive gastritis and duodenitis by EGD 2002  . Mixed hyperlipidemia   . Ischemic cardiomyopathy    LVEF 40-45% March 2013  . Dementia   . CKD (chronic kidney disease) stage 4, GFR 15-29 ml/min   . CHF (congestive heart failure)   . Chronic respiratory failure with hypoxia   . Stroke   . Seizures   . Anemia of chronic renal failure, stage 4 (severe) 01/02/2015     No Known Allergies   Current Outpatient Prescriptions  Medication Sig Dispense Refill  . calcitRIOL (ROCALTROL) 0.25 MCG capsule Take 1 capsule (0.25 mcg total) by mouth daily. 31 capsule 0  . donepezil (ARICEPT) 10 MG tablet Take 10 mg by mouth at bedtime.     . folic acid (FOLVITE) 1 MG tablet Take 1 mg by mouth every morning.     . hydrALAZINE (APRESOLINE) 25 MG tablet Take 75 mg by mouth every 8 (eight) hours.     . insulin aspart (NOVOLOG) 100 UNIT/ML injection Inject 2 Units into the skin 3 (three) times daily with meals. 10 mL 11  . insulin detemir (LEVEMIR) 100 UNIT/ML injection Inject 0.06 mLs (6 Units total) into the skin at bedtime. 10 mL 11  . isosorbide mononitrate (IMDUR) 30 MG 24 hr tablet Take 0.5 tablets (15 mg total) by mouth every morning. 30 tablet 0  . levETIRAcetam (KEPPRA) 100 MG/ML solution Take 500 mg by mouth 2 (two) times daily.     Marland Kitchen linagliptin (TRADJENTA) 5 MG TABS  tablet Take 5 mg by mouth every morning.     . Maltodextrin-Xanthan Gum (RESOURCE THICKENUP CLEAR) POWD Take 120 g by mouth as needed. (Patient taking differently: Take 1 Can by mouth daily. )    . metoprolol succinate (TOPROL-XL) 100 MG 24 hr tablet Take 50 mg by mouth daily. Take with or immediately following a meal.    . mupirocin ointment (BACTROBAN) 2 % Apply 1 application topically 2 (two) times daily.     . pantoprazole (PROTONIX) 40 MG tablet Take 40 mg by mouth every morning.    . potassium chloride (K-DUR) 10 MEQ tablet Take 10 mEq by mouth daily.    . pravastatin (PRAVACHOL) 20 MG tablet Take 20 mg by mouth at bedtime.     . torsemide (DEMADEX) 20 MG tablet Take 20 mg by mouth daily.     No current facility-administered  medications for this visit.     Past Surgical History  Procedure Laterality Date  . Coronary artery bypass graft  July 2002    LIMA to LAD, SVG to OM1, SVG to OM 2, SVG to RCA  . Tee without cardioversion  01/28/2012    Procedure: TRANSESOPHAGEAL ECHOCARDIOGRAM (TEE);  Surgeon: Lelon Perla, MD;  Location: [facility] ENDOSCOPY;  Service: Cardiovascular;  Laterality: [name];  . Colonoscopy  2002  . Esophagogastroduodenoscopy  2002  . Esophagogastroduodenoscopy [name] 01/18/2014    VZD:GLOVF hiatal hernia. Abnormal gastric and duodenal bulbar mucosa of uncertain significance - status post gastric bx (chronic gastritis/H.pylori +  . Colonoscopy [name] 06/25/2014    Dr. Laural Golden: tubular adenoma, pandiverticulosis.   . Agile capsule [name] 07/06/2014    Procedure: AGILE CAPSULE;  Surgeon: Danie Binder, MD;  Location: [facility] ENDO SUITE;  Service: Endoscopy;  Laterality: [name];  730  . Esophagogastroduodenoscopy [name] 07/20/2014    Dr. Gala Romney: tiny gastric polyps not manipulated. minimal pigmentation of duodenal bulb likely not significant  . Givens capsule study [name] 07/20/2014    incomplete     No Known Allergies    Family History  Problem Relation Age of Onset  . Stroke Father   . Diabetes type II Mother   . Colon cancer Neg Hx      Social History [name] reports that she quit smoking about 3 years ago. Her smoking use included Cigarettes. She started smoking about 54 years ago. She has a 50 pack-year smoking history. She has never used smokeless tobacco. [name] reports that she does not drink alcohol.   Review of Systems CONSTITUTIONAL: No weight loss, fever, chills, weakness or fatigue.  HEENT: Eyes: No visual loss, blurred vision, double vision or yellow sclerae.No hearing loss, sneezing, congestion, runny nose or sore throat.  SKIN: No rash or itching.  CARDIOVASCULAR: per HPI RESPIRATORY: No shortness of breath, cough or sputum.  GASTROINTESTINAL: No anorexia, nausea, vomiting or diarrhea. No  abdominal pain or blood.  GENITOURINARY: No burning on urination, no polyuria NEUROLOGICAL: No headache, dizziness, syncope, paralysis, ataxia, numbness or tingling in the extremities. No change in bowel or bladder control.  MUSCULOSKELETAL: No muscle, back pain, joint pain or stiffness.  LYMPHATICS: No enlarged nodes. No history of splenectomy.  PSYCHIATRIC: No history of depression or anxiety.  ENDOCRINOLOGIC: No reports of sweating, cold or heat intolerance. No polyuria or polydipsia.  Marland Kitchen   Physical Examination Filed Vitals:   06/25/15 1058  BP: 132/70  Pulse: 76   Filed Vitals:   06/25/15 1058  Height: 5\' 4"  (1.626 m)  Weight: 123 lb (55.792  kg)    Gen: resting comfortably, no acute distress HEENT: no scleral icterus, pupils equal round and reactive, no palptable cervical adenopathy,  CV: RRR, no m/r/g, no JVD Resp: Clear to auscultation bilaterally GI: abdomen is soft, non-tender, non-distended, normal bowel sounds, no hepatosplenomegaly MSK: extremities are warm, no edema.  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 Chronic combined systolic/diastolc HF - appears euvolemic today. Diuretics stopped during recent admission with AKI, since being off has not reaccumulated fluid. Will continue to follow clinically.   2. CAD - prior NSTEMIs in setting of severe symptomatic anemia, managed medically - continue current medical therapy, no indication for ischemic testing at this time. Given her dementia and advanced multiple comorbidities would be hesitant to pursue invasive testing at any point  3. Mitral regurgitation - no signficant symptoms - poor candidate for intervention, continue symptoms management with diuretics    F/u 6 months  Arnoldo Lenis, M.D.,

## 2015-06-26 ENCOUNTER — Encounter (HOSPITAL_COMMUNITY): Payer: Medicare Other | Attending: Hematology and Oncology

## 2015-06-26 ENCOUNTER — Encounter (HOSPITAL_BASED_OUTPATIENT_CLINIC_OR_DEPARTMENT_OTHER): Payer: Medicare Other

## 2015-06-26 VITALS — BP 150/63 | HR 67 | Temp 97.6°F | Resp 16

## 2015-06-26 DIAGNOSIS — Z87898 Personal history of other specified conditions: Secondary | ICD-10-CM | POA: Diagnosis not present

## 2015-06-26 DIAGNOSIS — Z951 Presence of aortocoronary bypass graft: Secondary | ICD-10-CM | POA: Insufficient documentation

## 2015-06-26 DIAGNOSIS — Z8673 Personal history of transient ischemic attack (TIA), and cerebral infarction without residual deficits: Secondary | ICD-10-CM | POA: Diagnosis not present

## 2015-06-26 DIAGNOSIS — D649 Anemia, unspecified: Secondary | ICD-10-CM | POA: Insufficient documentation

## 2015-06-26 DIAGNOSIS — D631 Anemia in chronic kidney disease: Secondary | ICD-10-CM

## 2015-06-26 DIAGNOSIS — D509 Iron deficiency anemia, unspecified: Secondary | ICD-10-CM

## 2015-06-26 DIAGNOSIS — Z794 Long term (current) use of insulin: Secondary | ICD-10-CM | POA: Diagnosis not present

## 2015-06-26 DIAGNOSIS — I2510 Atherosclerotic heart disease of native coronary artery without angina pectoris: Secondary | ICD-10-CM | POA: Diagnosis not present

## 2015-06-26 DIAGNOSIS — I129 Hypertensive chronic kidney disease with stage 1 through stage 4 chronic kidney disease, or unspecified chronic kidney disease: Secondary | ICD-10-CM | POA: Insufficient documentation

## 2015-06-26 DIAGNOSIS — R569 Unspecified convulsions: Secondary | ICD-10-CM | POA: Diagnosis not present

## 2015-06-26 DIAGNOSIS — Z87891 Personal history of nicotine dependence: Secondary | ICD-10-CM | POA: Insufficient documentation

## 2015-06-26 DIAGNOSIS — Z79899 Other long term (current) drug therapy: Secondary | ICD-10-CM | POA: Insufficient documentation

## 2015-06-26 DIAGNOSIS — N184 Chronic kidney disease, stage 4 (severe): Secondary | ICD-10-CM

## 2015-06-26 DIAGNOSIS — E780 Pure hypercholesterolemia: Secondary | ICD-10-CM | POA: Diagnosis not present

## 2015-06-26 DIAGNOSIS — I509 Heart failure, unspecified: Secondary | ICD-10-CM | POA: Diagnosis not present

## 2015-06-26 DIAGNOSIS — E119 Type 2 diabetes mellitus without complications: Secondary | ICD-10-CM | POA: Insufficient documentation

## 2015-06-26 DIAGNOSIS — I252 Old myocardial infarction: Secondary | ICD-10-CM | POA: Diagnosis not present

## 2015-06-26 DIAGNOSIS — F0390 Unspecified dementia without behavioral disturbance: Secondary | ICD-10-CM | POA: Insufficient documentation

## 2015-06-26 DIAGNOSIS — D62 Acute posthemorrhagic anemia: Secondary | ICD-10-CM

## 2015-06-26 LAB — CBC WITH DIFFERENTIAL/PLATELET
Basophils Absolute: 0.1 10*3/uL (ref 0.0–0.1)
Basophils Relative: 1 % (ref 0–1)
Eosinophils Absolute: 0.1 10*3/uL (ref 0.0–0.7)
Eosinophils Relative: 1 % (ref 0–5)
HCT: 29.4 % — ABNORMAL LOW (ref 36.0–46.0)
HEMOGLOBIN: 8.6 g/dL — AB (ref 12.0–15.0)
LYMPHS ABS: 1.2 10*3/uL (ref 0.7–4.0)
Lymphocytes Relative: 25 % (ref 12–46)
MCH: 27 pg (ref 26.0–34.0)
MCHC: 29.3 g/dL — AB (ref 30.0–36.0)
MCV: 92.5 fL (ref 78.0–100.0)
Monocytes Absolute: 0.7 10*3/uL (ref 0.1–1.0)
Monocytes Relative: 13 % — ABNORMAL HIGH (ref 3–12)
NEUTROS ABS: 3 10*3/uL (ref 1.7–7.7)
Neutrophils Relative %: 60 % (ref 43–77)
Platelets: 217 10*3/uL (ref 150–400)
RBC: 3.18 MIL/uL — AB (ref 3.87–5.11)
RDW: 18.7 % — AB (ref 11.5–15.5)
WBC: 5.1 10*3/uL (ref 4.0–10.5)

## 2015-06-26 MED ORDER — DARBEPOETIN ALFA 100 MCG/0.5ML IJ SOSY
PREFILLED_SYRINGE | INTRAMUSCULAR | Status: AC
  Filled 2015-06-26: qty 0.5

## 2015-06-26 MED ORDER — DARBEPOETIN ALFA 100 MCG/0.5ML IJ SOSY
80.0000 ug | PREFILLED_SYRINGE | Freq: Once | INTRAMUSCULAR | Status: AC
  Administered 2015-06-26: 80 ug via SUBCUTANEOUS

## 2015-06-26 NOTE — Progress Notes (Signed)
LABS DRAWN

## 2015-06-26 NOTE — Progress Notes (Signed)
[name] presents today for injection per MD orders. Aranesp 80 mcg administered SQ in right Abdomen. Administration without incident. Patient tolerated well.

## 2015-06-26 NOTE — Patient Instructions (Signed)
[place] at [facility] Discharge Instructions  RECOMMENDATIONS MADE BY THE CONSULTANT AND ANY TEST RESULTS WILL BE SENT TO YOUR REFERRING PHYSICIAN.  Hemoglobin 8.6 today. Aranesp 80 mcg injection given as ordered. Continue lab work and aranesp injection every 2 weeks as ordered. Return as scheduled.  Thank you for choosing [place] at [facility] to provide your oncology and hematology care.  To afford each patient quality time with our provider, please arrive at least 15 minutes before your scheduled appointment time.    You need to re-schedule your appointment should you arrive 10 or more minutes late.  We strive to give you quality time with our providers, and arriving late affects you and other patients whose appointments are after yours.  Also, if you no show three or more times for appointments you may be dismissed from the clinic at the providers discretion.     Again, thank you for choosing [facility].  Our hope is that these requests will decrease the amount of time that you wait before being seen by our physicians.       _____________________________________________________________  Should you have questions after your visit to [facility], please contact our office at [phone] between the hours of 8:30 a.m. and 4:30 p.m.  Voicemails left after 4:30 p.m. will not be returned until the following business day.  For prescription refill requests, have your pharmacy contact our office.

## 2015-06-27 ENCOUNTER — Telehealth: Payer: Self-pay

## 2015-06-27 NOTE — Telephone Encounter (Signed)
Spoke with [facility] and gave her the message below. She just called for an FYI for DR. BRANCH

## 2015-06-27 NOTE — Telephone Encounter (Signed)
-----   Message from Arnoldo Lenis, MD sent at 06/27/2015  2:16 PM EDT ----- Weight is ok, I saw her recently and she was doing well. Continue to monitor for further weight gain  [name] MD ----- Message -----    From: Levonne Hubert, LPN    Sent: 11/28/3792   1:44 PM      To: Arnoldo Lenis, MD  [facility] Clarise Cruz [phone]) called and states that the patient wt today is 120.8 lbs. And her Abd girth was 80 cm last week and it is 82 cm this week. Please advise.

## 2015-06-28 ENCOUNTER — Encounter: Payer: Self-pay | Admitting: *Deleted

## 2015-06-28 ENCOUNTER — Other Ambulatory Visit: Payer: Self-pay | Admitting: *Deleted

## 2015-06-28 NOTE — Patient Outreach (Signed)
TOC week #4  Call to patient spoke with patient daughter [name] as patient unable to speak on phone.  Daughter reporting patient doing well, saw cardiologist, who does not want to put her back on Torsemide at this time, he did an exam and said no issues with any fluid build up but does request daughter call if any signs or symptoms and they can put her back on fluid medication. Daughter reporting patient appetite still good, weight 120#, no edema in feet or legs. No shortness of breath. Reviewed medication list.  Plan to continue [facility] services will visit later in month. [name]. [name], RN, BSN, [place] [phone]

## 2015-07-03 ENCOUNTER — Inpatient Hospital Stay (HOSPITAL_COMMUNITY)
Admission: EM | Admit: 2015-07-03 | Discharge: 2015-07-13 | DRG: 291 | Disposition: A | Discharge status: Home Health | Payer: Medicare Other | Attending: Family Medicine | Admitting: Family Medicine

## 2015-07-03 ENCOUNTER — Telehealth: Payer: Self-pay | Admitting: Cardiology

## 2015-07-03 ENCOUNTER — Emergency Department (HOSPITAL_COMMUNITY): Payer: Medicare Other

## 2015-07-03 ENCOUNTER — Encounter (HOSPITAL_COMMUNITY): Payer: Self-pay | Admitting: Emergency Medicine

## 2015-07-03 DIAGNOSIS — E860 Dehydration: Secondary | ICD-10-CM | POA: Diagnosis present

## 2015-07-03 DIAGNOSIS — N179 Acute kidney failure, unspecified: Secondary | ICD-10-CM | POA: Diagnosis present

## 2015-07-03 DIAGNOSIS — N184 Chronic kidney disease, stage 4 (severe): Secondary | ICD-10-CM

## 2015-07-03 DIAGNOSIS — E1022 Type 1 diabetes mellitus with diabetic chronic kidney disease: Secondary | ICD-10-CM | POA: Diagnosis present

## 2015-07-03 DIAGNOSIS — K219 Gastro-esophageal reflux disease without esophagitis: Secondary | ICD-10-CM | POA: Diagnosis present

## 2015-07-03 DIAGNOSIS — I5043 Acute on chronic combined systolic (congestive) and diastolic (congestive) heart failure: Secondary | ICD-10-CM | POA: Diagnosis not present

## 2015-07-03 DIAGNOSIS — I509 Heart failure, unspecified: Secondary | ICD-10-CM | POA: Diagnosis not present

## 2015-07-03 DIAGNOSIS — N185 Chronic kidney disease, stage 5: Secondary | ICD-10-CM | POA: Diagnosis present

## 2015-07-03 DIAGNOSIS — F0390 Unspecified dementia without behavioral disturbance: Secondary | ICD-10-CM | POA: Diagnosis present

## 2015-07-03 DIAGNOSIS — R0602 Shortness of breath: Secondary | ICD-10-CM

## 2015-07-03 DIAGNOSIS — Z79899 Other long term (current) drug therapy: Secondary | ICD-10-CM

## 2015-07-03 DIAGNOSIS — Z8673 Personal history of transient ischemic attack (TIA), and cerebral infarction without residual deficits: Secondary | ICD-10-CM

## 2015-07-03 DIAGNOSIS — I252 Old myocardial infarction: Secondary | ICD-10-CM

## 2015-07-03 DIAGNOSIS — I132 Hypertensive heart and chronic kidney disease with heart failure and with stage 5 chronic kidney disease, or end stage renal disease: Principal | ICD-10-CM | POA: Diagnosis present

## 2015-07-03 DIAGNOSIS — Z951 Presence of aortocoronary bypass graft: Secondary | ICD-10-CM

## 2015-07-03 DIAGNOSIS — G40909 Epilepsy, unspecified, not intractable, without status epilepticus: Secondary | ICD-10-CM

## 2015-07-03 DIAGNOSIS — M899 Disorder of bone, unspecified: Secondary | ICD-10-CM | POA: Diagnosis present

## 2015-07-03 DIAGNOSIS — N189 Chronic kidney disease, unspecified: Secondary | ICD-10-CM

## 2015-07-03 DIAGNOSIS — E782 Mixed hyperlipidemia: Secondary | ICD-10-CM | POA: Diagnosis present

## 2015-07-03 DIAGNOSIS — D649 Anemia, unspecified: Secondary | ICD-10-CM | POA: Diagnosis present

## 2015-07-03 DIAGNOSIS — I120 Hypertensive chronic kidney disease with stage 5 chronic kidney disease or end stage renal disease: Secondary | ICD-10-CM | POA: Diagnosis present

## 2015-07-03 DIAGNOSIS — E875 Hyperkalemia: Secondary | ICD-10-CM | POA: Diagnosis present

## 2015-07-03 DIAGNOSIS — Z794 Long term (current) use of insulin: Secondary | ICD-10-CM

## 2015-07-03 DIAGNOSIS — I255 Ischemic cardiomyopathy: Secondary | ICD-10-CM | POA: Diagnosis present

## 2015-07-03 DIAGNOSIS — D631 Anemia in chronic kidney disease: Secondary | ICD-10-CM | POA: Diagnosis present

## 2015-07-03 DIAGNOSIS — Z87891 Personal history of nicotine dependence: Secondary | ICD-10-CM

## 2015-07-03 DIAGNOSIS — E109 Type 1 diabetes mellitus without complications: Secondary | ICD-10-CM

## 2015-07-03 DIAGNOSIS — I2510 Atherosclerotic heart disease of native coronary artery without angina pectoris: Secondary | ICD-10-CM | POA: Diagnosis present

## 2015-07-03 DIAGNOSIS — R32 Unspecified urinary incontinence: Secondary | ICD-10-CM | POA: Diagnosis present

## 2015-07-03 DIAGNOSIS — J9611 Chronic respiratory failure with hypoxia: Secondary | ICD-10-CM | POA: Diagnosis present

## 2015-07-03 DIAGNOSIS — N2581 Secondary hyperparathyroidism of renal origin: Secondary | ICD-10-CM | POA: Diagnosis present

## 2015-07-03 LAB — URINALYSIS, ROUTINE W REFLEX MICROSCOPIC
BILIRUBIN URINE: NEGATIVE
GLUCOSE, UA: NEGATIVE mg/dL
Ketones, ur: NEGATIVE mg/dL
NITRITE: NEGATIVE
Protein, ur: 100 mg/dL — AB
Specific Gravity, Urine: 1.025 (ref 1.005–1.030)
Urobilinogen, UA: 0.2 mg/dL (ref 0.0–1.0)
pH: 5.5 (ref 5.0–8.0)

## 2015-07-03 LAB — URINE MICROSCOPIC-ADD ON

## 2015-07-03 LAB — COMPREHENSIVE METABOLIC PANEL
ALBUMIN: 3.4 g/dL — AB (ref 3.5–5.0)
ALT: 19 U/L (ref 14–54)
AST: 28 U/L (ref 15–41)
Alkaline Phosphatase: 102 U/L (ref 38–126)
Anion gap: 6 (ref 5–15)
BILIRUBIN TOTAL: 0.5 mg/dL (ref 0.3–1.2)
BUN: 77 mg/dL — ABNORMAL HIGH (ref 6–20)
CHLORIDE: 121 mmol/L — AB (ref 101–111)
CO2: 12 mmol/L — AB (ref 22–32)
Calcium: 8.7 mg/dL — ABNORMAL LOW (ref 8.9–10.3)
Creatinine, Ser: 4.47 mg/dL — ABNORMAL HIGH (ref 0.44–1.00)
GFR, EST AFRICAN AMERICAN: 10 mL/min — AB (ref >60)
GFR, EST NON AFRICAN AMERICAN: 9 mL/min — AB (ref >60)
Glucose, Bld: 144 mg/dL — ABNORMAL HIGH (ref 65–99)
POTASSIUM: 5.4 mmol/L — AB (ref 3.5–5.1)
Sodium: 139 mmol/L (ref 135–145)
Total Protein: 8.9 g/dL — ABNORMAL HIGH (ref 6.5–8.1)

## 2015-07-03 LAB — CBC WITH DIFFERENTIAL/PLATELET
Basophils Absolute: 0.1 10*3/uL (ref 0.0–0.1)
Basophils Relative: 1 % (ref 0–1)
EOS ABS: 0.1 10*3/uL (ref 0.0–0.7)
EOS PCT: 1 % (ref 0–5)
HEMATOCRIT: 30.4 % — AB (ref 36.0–46.0)
HEMOGLOBIN: 8.9 g/dL — AB (ref 12.0–15.0)
Lymphocytes Relative: 17 % (ref 12–46)
Lymphs Abs: 1 10*3/uL (ref 0.7–4.0)
MCH: 27.9 pg (ref 26.0–34.0)
MCHC: 29.3 g/dL — AB (ref 30.0–36.0)
MCV: 95.3 fL (ref 78.0–100.0)
Monocytes Absolute: 1 10*3/uL (ref 0.1–1.0)
Monocytes Relative: 16 % — ABNORMAL HIGH (ref 3–12)
Neutro Abs: 3.9 10*3/uL (ref 1.7–7.7)
Neutrophils Relative %: 65 % (ref 43–77)
PLATELETS: 205 10*3/uL (ref 150–400)
RBC: 3.19 MIL/uL — ABNORMAL LOW (ref 3.87–5.11)
RDW: 19.7 % — ABNORMAL HIGH (ref 11.5–15.5)
WBC: 6 10*3/uL (ref 4.0–10.5)

## 2015-07-03 LAB — TROPONIN I: Troponin I: 0.03 ng/mL (ref <0.031)

## 2015-07-03 LAB — GLUCOSE, CAPILLARY: Glucose-Capillary: 97 mg/dL (ref 65–99)

## 2015-07-03 LAB — I-STAT CG4 LACTIC ACID, ED: LACTIC ACID, VENOUS: 1.37 mmol/L (ref 0.5–2.0)

## 2015-07-03 LAB — BRAIN NATRIURETIC PEPTIDE: B Natriuretic Peptide: 3737 pg/mL — ABNORMAL HIGH (ref 0.0–100.0)

## 2015-07-03 MED ORDER — INSULIN ASPART 100 UNIT/ML ~~LOC~~ SOLN
0.0000 [IU] | Freq: Three times a day (TID) | SUBCUTANEOUS | Status: DC
  Administered 2015-07-04 – 2015-07-05 (×3): 1 [IU] via SUBCUTANEOUS
  Administered 2015-07-06: 2 [IU] via SUBCUTANEOUS
  Administered 2015-07-06 – 2015-07-07 (×3): 1 [IU] via SUBCUTANEOUS
  Administered 2015-07-08: 3 [IU] via SUBCUTANEOUS
  Administered 2015-07-09: 1 [IU] via SUBCUTANEOUS
  Administered 2015-07-09 – 2015-07-10 (×3): 2 [IU] via SUBCUTANEOUS
  Administered 2015-07-10 – 2015-07-11 (×3): 1 [IU] via SUBCUTANEOUS
  Administered 2015-07-11 – 2015-07-12 (×3): 2 [IU] via SUBCUTANEOUS
  Administered 2015-07-13: 1 [IU] via SUBCUTANEOUS

## 2015-07-03 MED ORDER — ONDANSETRON HCL 4 MG PO TABS: 4.0000 mg | ORAL_TABLET | Freq: Four times a day (QID) | ORAL | Status: DC | PRN

## 2015-07-03 MED ORDER — SODIUM CHLORIDE 0.9 % IJ SOLN: 3.0000 mL | INTRAMUSCULAR | Status: DC | PRN

## 2015-07-03 MED ORDER — CALCITRIOL 0.25 MCG PO CAPS
0.2500 ug | ORAL_CAPSULE | Freq: Every day | ORAL | Status: DC
  Administered 2015-07-04 – 2015-07-13 (×10): 0.25 ug via ORAL
  Filled 2015-07-03 (×10): qty 1

## 2015-07-03 MED ORDER — METOPROLOL SUCCINATE ER 50 MG PO TB24
50.0000 mg | ORAL_TABLET | Freq: Every day | ORAL | Status: DC
  Administered 2015-07-04 – 2015-07-13 (×10): 50 mg via ORAL
  Filled 2015-07-03 (×11): qty 1

## 2015-07-03 MED ORDER — ACETAMINOPHEN 325 MG PO TABS: 650.0000 mg | ORAL_TABLET | Freq: Four times a day (QID) | ORAL | Status: DC | PRN

## 2015-07-03 MED ORDER — SODIUM POLYSTYRENE SULFONATE 15 GM/60ML PO SUSP
15.0000 g | Freq: Once | ORAL | Status: AC
  Administered 2015-07-03: 15 g via ORAL
  Filled 2015-07-03: qty 60

## 2015-07-03 MED ORDER — PANTOPRAZOLE SODIUM 40 MG PO TBEC
40.0000 mg | DELAYED_RELEASE_TABLET | Freq: Every morning | ORAL | Status: DC
  Administered 2015-07-04 – 2015-07-13 (×10): 40 mg via ORAL
  Filled 2015-07-03 (×10): qty 1

## 2015-07-03 MED ORDER — LEVETIRACETAM 100 MG/ML PO SOLN
500.0000 mg | Freq: Two times a day (BID) | ORAL | Status: DC
  Administered 2015-07-03 – 2015-07-13 (×19): 500 mg via ORAL
  Filled 2015-07-03 (×27): qty 5

## 2015-07-03 MED ORDER — HEPARIN SODIUM (PORCINE) 5000 UNIT/ML IJ SOLN
5000.0000 [IU] | Freq: Three times a day (TID) | INTRAMUSCULAR | Status: DC
  Administered 2015-07-03 – 2015-07-13 (×28): 5000 [IU] via SUBCUTANEOUS
  Filled 2015-07-03 (×29): qty 1

## 2015-07-03 MED ORDER — SODIUM CHLORIDE 0.9 % IJ SOLN
3.0000 mL | Freq: Two times a day (BID) | INTRAMUSCULAR | Status: DC
  Administered 2015-07-04 – 2015-07-12 (×5): 3 mL via INTRAVENOUS

## 2015-07-03 MED ORDER — SODIUM CHLORIDE 0.9 % IV SOLN: 250.0000 mL | INTRAVENOUS | Status: DC | PRN

## 2015-07-03 MED ORDER — ACETAMINOPHEN 650 MG RE SUPP: 650.0000 mg | Freq: Four times a day (QID) | RECTAL | Status: DC | PRN

## 2015-07-03 MED ORDER — HYDRALAZINE HCL 25 MG PO TABS
75.0000 mg | ORAL_TABLET | Freq: Three times a day (TID) | ORAL | Status: DC
  Administered 2015-07-03 – 2015-07-13 (×28): 75 mg via ORAL
  Filled 2015-07-03 (×31): qty 3

## 2015-07-03 MED ORDER — DONEPEZIL HCL 5 MG PO TABS
10.0000 mg | ORAL_TABLET | Freq: Every day | ORAL | Status: DC
  Administered 2015-07-03 – 2015-07-12 (×10): 10 mg via ORAL
  Filled 2015-07-03 (×8): qty 2
  Filled 2015-07-03: qty 1
  Filled 2015-07-03 (×4): qty 2
  Filled 2015-07-03: qty 1
  Filled 2015-07-03: qty 2
  Filled 2015-07-03: qty 1
  Filled 2015-07-03 (×2): qty 2
  Filled 2015-07-03 (×3): qty 1
  Filled 2015-07-03 (×2): qty 2

## 2015-07-03 MED ORDER — SODIUM CHLORIDE 0.9 % IJ SOLN
3.0000 mL | Freq: Two times a day (BID) | INTRAMUSCULAR | Status: DC
  Administered 2015-07-03 – 2015-07-13 (×6): 3 mL via INTRAVENOUS

## 2015-07-03 MED ORDER — HYDRALAZINE HCL 50 MG PO TABS
75.0000 mg | ORAL_TABLET | Freq: Three times a day (TID) | ORAL | Status: DC
  Filled 2015-07-03 (×3): qty 1

## 2015-07-03 MED ORDER — ONDANSETRON HCL 4 MG/2ML IJ SOLN
4.0000 mg | Freq: Four times a day (QID) | INTRAMUSCULAR | Status: DC | PRN
  Filled 2015-07-03: qty 2

## 2015-07-03 MED ORDER — FUROSEMIDE 10 MG/ML IJ SOLN
40.0000 mg | Freq: Two times a day (BID) | INTRAMUSCULAR | Status: DC
  Administered 2015-07-04 – 2015-07-05 (×4): 40 mg via INTRAVENOUS
  Filled 2015-07-03 (×4): qty 4

## 2015-07-03 MED ORDER — PRAVASTATIN SODIUM 10 MG PO TABS
20.0000 mg | ORAL_TABLET | Freq: Every day | ORAL | Status: DC
  Administered 2015-07-04 – 2015-07-12 (×9): 20 mg via ORAL
  Filled 2015-07-03 (×4): qty 2
  Filled 2015-07-03: qty 1
  Filled 2015-07-03 (×2): qty 2
  Filled 2015-07-03 (×2): qty 1
  Filled 2015-07-03: qty 2
  Filled 2015-07-03: qty 1
  Filled 2015-07-03: qty 2
  Filled 2015-07-03: qty 1
  Filled 2015-07-03 (×6): qty 2

## 2015-07-03 MED ORDER — ISOSORBIDE MONONITRATE ER 30 MG PO TB24
15.0000 mg | ORAL_TABLET | Freq: Every morning | ORAL | Status: DC
  Administered 2015-07-04 – 2015-07-13 (×10): 15 mg via ORAL
  Filled 2015-07-03 (×12): qty 1

## 2015-07-03 MED ORDER — INSULIN ASPART 100 UNIT/ML ~~LOC~~ SOLN
0.0000 [IU] | Freq: Every day | SUBCUTANEOUS | Status: DC
  Administered 2015-07-08: 2 [IU] via SUBCUTANEOUS

## 2015-07-03 MED ORDER — FUROSEMIDE 10 MG/ML IJ SOLN
40.0000 mg | INTRAMUSCULAR | Status: AC
  Administered 2015-07-03: 40 mg via INTRAVENOUS
  Filled 2015-07-03: qty 4

## 2015-07-03 MED ORDER — ONDANSETRON HCL 4 MG/2ML IJ SOLN: 4.0000 mg | Freq: Three times a day (TID) | INTRAMUSCULAR | Status: AC | PRN

## 2015-07-03 NOTE — Telephone Encounter (Signed)
Agree with ER evaluation. Her fluid pills were stopped during a recent admission for severe kidney failure. Weights were stable at outpatient visit but appear to be worsening since then.   [name] MD

## 2015-07-03 NOTE — ED Provider Notes (Signed)
CSN: [id number]     Arrival date & time 07/03/15  1445 History   First MD Initiated Contact with Patient 07/03/15 1452     Chief Complaint  Patient presents with  . Shortness of Breath     (Consider location/radiation/quality/duration/timing/severity/associated sxs/prior Treatment) HPI Comments: The pt has hx of DM, CHF, Echo last year with EF of 50% and Grade 2 diastolic dysfunction Pt has been more SOB in the last few days - generally weak today and some difficulty ambulating - uses O2 24/7.  She has had mild cough, no fevers or diarreha and denies swelling - the home heatlh nurse came to house and told her she "needed to come to ED to get evaluated as the pill wouldn't do enough."  Sx are persistsent, moderate, worse with ambulation.  She had CABG in 2002, since then has had stable EF and is followed by local Cardiology - b/c of 7 lb weight gain this week it was recommended by Dr. Harl Bowie (per EMR) that she be brought to hospital for diuresis - recently diuretics were stopped due to declining renal funciton.  Patient is a 74 y.o. female presenting with shortness of breath. The history is provided by the patient, a relative and medical records.  Shortness of Breath   Past Medical History  Diagnosis Date  . Type 2 diabetes mellitus   . Essential hypertension, benign   . History of stroke     Previously on Coumadin  . MI, old     Reported 35  . Seizure disorder   . Coronary atherosclerosis of native coronary artery     Multivessel status post CABG 2002  . History of GI bleed     Erosive gastritis and duodenitis by EGD 2002  . Mixed hyperlipidemia   . Ischemic cardiomyopathy     LVEF 40-45% March 2013  . Dementia   . CKD (chronic kidney disease) stage 4, GFR 15-29 ml/min   . CHF (congestive heart failure)   . Chronic respiratory failure with hypoxia   . Stroke   . Seizures   . Anemia of chronic renal failure, stage 4 (severe) 01/02/2015   Past Surgical History  Procedure  Laterality Date  . Coronary artery bypass graft  July 2002    LIMA to LAD, SVG to OM1, SVG to OM 2, SVG to RCA  . Tee without cardioversion  01/28/2012    Procedure: TRANSESOPHAGEAL ECHOCARDIOGRAM (TEE);  Surgeon: Lelon Perla, MD;  Location: [facility] ENDOSCOPY;  Service: Cardiovascular;  Laterality: [name];  . Colonoscopy  2002  . Esophagogastroduodenoscopy  2002  . Esophagogastroduodenoscopy [name] 01/18/2014    INO:MVEHM hiatal hernia. Abnormal gastric and duodenal bulbar mucosa of uncertain significance - status post gastric bx (chronic gastritis/H.pylori +  . Colonoscopy [name] 06/25/2014    Dr. Laural Golden: tubular adenoma, pandiverticulosis.   . Agile capsule [name] 07/06/2014    Procedure: AGILE CAPSULE;  Surgeon: Danie Binder, MD;  Location: [facility] ENDO SUITE;  Service: Endoscopy;  Laterality: [name];  730  . Esophagogastroduodenoscopy [name] 07/20/2014    Dr. Gala Romney: tiny gastric polyps not manipulated. minimal pigmentation of duodenal bulb likely not significant  . Givens capsule study [name] 07/20/2014    incomplete   Family History  Problem Relation Age of Onset  . Stroke Father   . Diabetes type II Mother   . Colon cancer Neg Hx    History  Substance Use Topics  . Smoking status: Former Smoker -- 1.00 packs/day for 50 years  Types: Cigarettes    Start date: 02/22/1961    Quit date: 02/23/2012  . Smokeless tobacco: Never Used  . Alcohol Use: No   OB History    No data available     Review of Systems  Unable to perform ROS: Dementia  Respiratory: Positive for shortness of breath.       Allergies  Review of patient's allergies indicates no known allergies.  Home Medications   Prior to Admission medications   Medication Sig Start Date End Date Taking? Authorizing Provider  calcitRIOL (ROCALTROL) 0.25 MCG capsule Take 1 capsule (0.25 mcg total) by mouth daily. 05/02/14  Yes Eugenie Filler, MD  donepezil (ARICEPT) 10 MG tablet Take 10 mg by mouth at bedtime.  01/30/14  Yes Historical Provider,  MD  folic acid (FOLVITE) 1 MG tablet Take 1 mg by mouth every morning.    Yes Historical Provider, MD  HUMALOG 100 UNIT/ML injection Inject 2 Units into the skin 4 (four) times daily -  with meals and at bedtime.  06/04/15  Yes Historical Provider, MD  hydrALAZINE (APRESOLINE) 25 MG tablet Take 75 mg by mouth every 8 (eight) hours.    Yes Historical Provider, MD  insulin aspart (NOVOLOG) 100 UNIT/ML injection Inject 2 Units into the skin 3 (three) times daily with meals. 06/03/15  Yes Orvan Falconer, MD  insulin detemir (LEVEMIR) 100 UNIT/ML injection Inject 0.06 mLs (6 Units total) into the skin at bedtime. Patient taking differently: Inject 4 Units into the skin at bedtime.  06/03/15  Yes Orvan Falconer, MD  isosorbide mononitrate (IMDUR) 30 MG 24 hr tablet Take 0.5 tablets (15 mg total) by mouth every morning. 01/09/15  Yes Lezlie Octave Black, NP  levETIRAcetam (KEPPRA) 100 MG/ML solution Take 500 mg by mouth 2 (two) times daily.  09/29/14  Yes Historical Provider, MD  linagliptin (TRADJENTA) 5 MG TABS tablet Take 5 mg by mouth every morning.    Yes Historical Provider, MD  Maltodextrin-Xanthan Gum (RESOURCE THICKENUP CLEAR) POWD Take 120 g by mouth as needed. Patient taking differently: Take 1 Can by mouth daily.  01/09/15  Yes Lezlie Octave Black, NP  metoprolol succinate (TOPROL-XL) 100 MG 24 hr tablet Take 50 mg by mouth daily. Take with or immediately following a meal.   Yes Historical Provider, MD  mupirocin ointment (BACTROBAN) 2 % Apply 1 application topically 2 (two) times daily.  01/03/15  Yes Historical Provider, MD  pantoprazole (PROTONIX) 40 MG tablet Take 40 mg by mouth every morning.   Yes Historical Provider, MD  pravastatin (PRAVACHOL) 20 MG tablet Take 20 mg by mouth at bedtime.    Yes Historical Provider, MD   BP 138/49 mmHg  Pulse 63  Temp(Src) 97.7 F (36.5 C) (Oral)  Resp 19  Ht 5\' 4"  (1.626 m)  Wt 127 lb (57.607 kg)  BMI 21.79 kg/m2  SpO2 100% Physical Exam  Constitutional: She appears  well-developed and well-nourished. No distress.  HENT:  Head: Normocephalic and atraumatic.  Mouth/Throat: Oropharynx is clear and moist. No oropharyngeal exudate.  Eyes: Conjunctivae and EOM are normal. Pupils are equal, round, and reactive to light. Right eye exhibits no discharge. Left eye exhibits no discharge. No scleral icterus.  Neck: Normal range of motion. Neck supple. No JVD present. No thyromegaly present.  Cardiovascular: Normal rate, regular rhythm, normal heart sounds and intact distal pulses.  Exam reveals no gallop and no friction rub.   No murmur heard. JVD to the angle of the jaw.  No peripheral  edema  Pulmonary/Chest: Effort normal. No respiratory distress. She has wheezes. She has rales.  Intermittent rales / ronchi and wheezing.  No distress and no inc WOB  Abdominal: Soft. Bowel sounds are normal. She exhibits no distension and no mass. There is no tenderness.  Musculoskeletal: Normal range of motion. She exhibits no edema or tenderness.  Lymphadenopathy:    She has no cervical adenopathy.  Neurological: She is alert. Coordination normal.  Skin: Skin is warm and dry. No rash noted. No erythema.  Psychiatric: She has a normal mood and affect. Her behavior is normal.  Nursing note and vitals reviewed.   ED Course  Procedures (including critical care time) Labs Review Labs Reviewed  CBC WITH DIFFERENTIAL/PLATELET - Abnormal; Notable for the following:    RBC 3.19 (*)    Hemoglobin 8.9 (*)    HCT 30.4 (*)    MCHC 29.3 (*)    RDW 19.7 (*)    Monocytes Relative 16 (*)    All other components within normal limits  COMPREHENSIVE METABOLIC PANEL - Abnormal; Notable for the following:    Potassium 5.4 (*)    Chloride 121 (*)    CO2 12 (*)    Glucose, Bld 144 (*)    BUN 77 (*)    Creatinine, Ser 4.47 (*)    Calcium 8.7 (*)    Total Protein 8.9 (*)    Albumin 3.4 (*)    GFR calc non Af Amer 9 (*)    GFR calc Af Amer 10 (*)    All other components within normal  limits  BRAIN NATRIURETIC PEPTIDE - Abnormal; Notable for the following:    B Natriuretic Peptide 3737.0 (*)    All other components within normal limits  TROPONIN I  URINALYSIS, ROUTINE W REFLEX MICROSCOPIC (NOT AT [facility])  I-STAT CG4 LACTIC ACID, ED  I-STAT CG4 LACTIC ACID, ED    Imaging Review Dg Chest Port 1 View  07/03/2015   CLINICAL DATA:  Cough.  EXAM: PORTABLE CHEST - 1 VIEW  COMPARISON:  May 28, 2015.  FINDINGS: Stable cardiomegaly. Status post coronary artery bypass graft. No pneumothorax or significant pleural effusion is noted. Left lung appears clear. Mild right basilar subsegmental atelectasis is noted. Bony thorax is intact.  IMPRESSION: Mild right basilar subsegmental atelectasis.   Electronically Signed   By: Marijo Conception, M.D.   On: 07/03/2015 15:25     EKG Interpretation   Date/Time:  Tuesday July 03 2015 14:58:53 EDT Ventricular Rate:  64 PR Interval:  202 QRS Duration: 96 QT Interval:  635 QTC Calculation: 655 R Axis:   88 Text Interpretation:  Sinus rhythm Low voltage, extremity leads Prolonged  QT interval Nonspecific T wave abnormality since last tracing no  significant change Nonspecific T wave abnormality now present. Confirmed  by Sabra Heck  MD, [place] ([id number]) on 07/03/2015 3:30:04 PM      MDM   Final diagnoses:  SOB (shortness of breath)  CHF exacerbation  Acute on chronic renal failure    The pt appears slightly weak, this is non focal - her cardiac exam is overall remarkable for JVD  and with her weight gain - would suggest that she is fluid overloaded.   Xray, labs and diuresis.  I have personally viewed and interpreted the imaging and agree with radiologist interpretation. No findigns of pna or edema Acute worsneing renal failure Chronic anemia  Pt needs diuresis, renal attention, D/w hospitalist who will see pt in ED.  Meds given  in ED:  Medications  furosemide (LASIX) injection 40 mg (not administered)      Noemi Chapel,  MD 07/03/15 1745

## 2015-07-03 NOTE — ED Notes (Signed)
Gaspar Bidding R.N. Completed in/out cath.  Aleene Davidson EMT witness

## 2015-07-03 NOTE — Telephone Encounter (Signed)
[facility] [name] called again states pt has now gained almost 7 lbs was 120.8 on 06/27/15 now 127 lbs  and has Resp 20,SaO2 98% on 2 Liters O2.She said she audibly sounds wet and asks for instructions

## 2015-07-03 NOTE — Telephone Encounter (Signed)
[name] w/ [facility] is with the pt and the pt has had an increased weight gain since last visit w/ Dr. Harl Bowie of 7lbs since taking her off her fluid pills and difficulty breathing

## 2015-07-03 NOTE — ED Notes (Signed)
Patient placed on bedpan.

## 2015-07-03 NOTE — Telephone Encounter (Signed)
Update: [name] w/ [facility] states they are going to take the patient to [facility] ED for IV diuresis

## 2015-07-03 NOTE — H&P (Signed)
[facility] History and Physical  [name] ACZ:[id number] DOB: 11/20/1941 DOA: 07/03/2015  Referring physician: Dr Sabra Heck - [facility] PCP: [name], MD   Chief Complaint: SOB  HPI: [name] is a 74 y.o. female  Level V caveat: Patient counter aided by patient's daughter due to patient's advanced dementia. Patient minimally participatory in admission.  Patient has been coming progressively short of breath over the last few days. Patient with home O2 24/7. Patient has had no complaint during this period of time but home nursing aide and patient's daughter have noticed that she has had a harder time breathing. Patient has baseline orthopnea which is not been worse recently. No increase in home O2. Home nursing staff stated that the patient's lungs sound likely had fluid today and sent her to the hospital for further evaluation. Symptoms are worse with ambulation. Nothing makes her symptoms better. Symptoms are constant.  History of CHF with EF of 50% and grade 2 diastolic dysfunction. History of CABG in 2002. Patient was admitted in early July for acute kidney injury which is thought to be secondary to her diuretics. She has not taken any diuretics since July 10. Familiesthat patient's weight has gradually increased since this period of time. Patient's appetite is preserved, and bowel and bladder function, and mentation are at baseline.   Review of Systems:   Unable to obtain further review systems due to patient's mental status.  Past Medical History  Diagnosis Date  . Type 2 diabetes mellitus   . Essential hypertension, benign   . History of stroke     Previously on Coumadin  . MI, old     Reported 57  . Seizure disorder   . Coronary atherosclerosis of native coronary artery     Multivessel status post CABG 2002  . History of GI bleed     Erosive gastritis and duodenitis by EGD 2002  . Mixed hyperlipidemia   . Ischemic cardiomyopathy     LVEF 40-45% March  2013  . Dementia   . CKD (chronic kidney disease) stage 4, GFR 15-29 ml/min   . CHF (congestive heart failure)   . Chronic respiratory failure with hypoxia   . Stroke   . Seizures   . Anemia of chronic renal failure, stage 4 (severe) 01/02/2015   Past Surgical History  Procedure Laterality Date  . Coronary artery bypass graft  July 2002    LIMA to LAD, SVG to OM1, SVG to OM 2, SVG to RCA  . Tee without cardioversion  01/28/2012    Procedure: TRANSESOPHAGEAL ECHOCARDIOGRAM (TEE);  Surgeon: Lelon Perla, MD;  Location: [facility] ENDOSCOPY;  Service: Cardiovascular;  Laterality: [name];  . Colonoscopy  2002  . Esophagogastroduodenoscopy  2002  . Esophagogastroduodenoscopy [name] 01/18/2014    FUX:NATFT hiatal hernia. Abnormal gastric and duodenal bulbar mucosa of uncertain significance - status post gastric bx (chronic gastritis/H.pylori +  . Colonoscopy [name] 06/25/2014    Dr. Laural Golden: tubular adenoma, pandiverticulosis.   . Agile capsule [name] 07/06/2014    Procedure: AGILE CAPSULE;  Surgeon: Danie Binder, MD;  Location: [facility] ENDO SUITE;  Service: Endoscopy;  Laterality: [name];  730  . Esophagogastroduodenoscopy [name] 07/20/2014    Dr. Gala Romney: tiny gastric polyps not manipulated. minimal pigmentation of duodenal bulb likely not significant  . Givens capsule study [name] 07/20/2014    incomplete   Social History:  reports that she quit smoking about 3 years ago. Her smoking use included Cigarettes. She started smoking about 54 years  ago. She has a 50 pack-year smoking history. She has never used smokeless tobacco. She reports that she does not drink alcohol or use illicit drugs.  No Known Allergies  Family History  Problem Relation Age of Onset  . Stroke Father   . Diabetes type II Mother   . Colon cancer Neg Hx      Prior to Admission medications   Medication Sig Start Date End Date Taking? Authorizing Provider  calcitRIOL (ROCALTROL) 0.25 MCG capsule Take 1 capsule (0.25 mcg total) by mouth daily. 05/02/14   Yes Eugenie Filler, MD  donepezil (ARICEPT) 10 MG tablet Take 10 mg by mouth at bedtime.  01/30/14  Yes Historical Provider, MD  folic acid (FOLVITE) 1 MG tablet Take 1 mg by mouth every morning.    Yes Historical Provider, MD  HUMALOG 100 UNIT/ML injection Inject 2 Units into the skin 4 (four) times daily -  with meals and at bedtime.  06/04/15  Yes Historical Provider, MD  hydrALAZINE (APRESOLINE) 25 MG tablet Take 75 mg by mouth every 8 (eight) hours.    Yes Historical Provider, MD  insulin aspart (NOVOLOG) 100 UNIT/ML injection Inject 2 Units into the skin 3 (three) times daily with meals. 06/03/15  Yes Orvan Falconer, MD  insulin detemir (LEVEMIR) 100 UNIT/ML injection Inject 0.06 mLs (6 Units total) into the skin at bedtime. Patient taking differently: Inject 4 Units into the skin at bedtime.  06/03/15  Yes Orvan Falconer, MD  isosorbide mononitrate (IMDUR) 30 MG 24 hr tablet Take 0.5 tablets (15 mg total) by mouth every morning. 01/09/15  Yes Lezlie Octave Black, NP  levETIRAcetam (KEPPRA) 100 MG/ML solution Take 500 mg by mouth 2 (two) times daily.  09/29/14  Yes Historical Provider, MD  linagliptin (TRADJENTA) 5 MG TABS tablet Take 5 mg by mouth every morning.    Yes Historical Provider, MD  Maltodextrin-Xanthan Gum (RESOURCE THICKENUP CLEAR) POWD Take 120 g by mouth as needed. Patient taking differently: Take 1 Can by mouth daily.  01/09/15  Yes Lezlie Octave Black, NP  metoprolol succinate (TOPROL-XL) 100 MG 24 hr tablet Take 50 mg by mouth daily. Take with or immediately following a meal.   Yes Historical Provider, MD  mupirocin ointment (BACTROBAN) 2 % Apply 1 application topically 2 (two) times daily.  01/03/15  Yes Historical Provider, MD  pantoprazole (PROTONIX) 40 MG tablet Take 40 mg by mouth every morning.   Yes Historical Provider, MD  pravastatin (PRAVACHOL) 20 MG tablet Take 20 mg by mouth at bedtime.    Yes Historical Provider, MD   Physical Exam: Filed Vitals:   07/03/15 1700 07/03/15 1730 07/03/15  1745 07/03/15 1800  BP:  133/67  159/60  Pulse: 63  62   Temp:      TempSrc:      Resp: 18  19 16   Height:      Weight:      SpO2: 100%  100%     Wt Readings from Last 3 Encounters:  07/03/15 57.607 kg (127 lb)  06/25/15 55.792 kg (123 lb)  06/21/15 54.432 kg (120 lb)    General: Elderly,  Appears calm and comfortable Eyes:  PERRL, normal lids, irises & conjunctiva ENT:  grossly normal hearing, lips & tongue Cardiovascular:  RRR, III/VI systolic murmur. 1+ LE edema. Respiratory: Diffuse crackles and rhonchi in lung bases bilaterally. Mild increased respiratory effort. Abdomen:  soft, ntnd Skin:  no rash or induration seen on limited exam Musculoskeletal: Difficult to fully ascertain  patient's muscle strength and tone due to poor participation but able to move all 70s. No effusions noted. Psychiatric: Patient follows basic commands but is alert and oriented 0. Was only minimal conversation. Neurologic: Cranial nerves grossly intact, moves all extremity coordinated fashion.          Labs on Admission:  Basic Metabolic Panel:  Recent Labs Lab 07/03/15 1514  NA 139  K 5.4*  CL 121*  CO2 12*  GLUCOSE 144*  BUN 77*  CREATININE 4.47*  CALCIUM 8.7*   Liver Function Tests:  Recent Labs Lab 07/03/15 1514  AST 28  ALT 19  ALKPHOS 102  BILITOT 0.5  PROT 8.9*  ALBUMIN 3.4*   No results for input(s): LIPASE, AMYLASE in the last 168 hours. No results for input(s): AMMONIA in the last 168 hours. CBC:  Recent Labs Lab 07/03/15 1514  WBC 6.0  NEUTROABS 3.9  HGB 8.9*  HCT 30.4*  MCV 95.3  PLT 205   Cardiac Enzymes:  Recent Labs Lab 07/03/15 1514  TROPONINI 0.03    BNP (last 3 results)  Recent Labs  01/02/15 1125 05/28/15 1148 07/03/15 1514  BNP >4500.0* 1084.0* 3737.0*    ProBNP (last 3 results) No results for input(s): PROBNP in the last 8760 hours.  CBG: No results for input(s): GLUCAP in the last 168 hours.  Radiological Exams on  Admission: Dg Chest Port 1 View  07/03/2015   CLINICAL DATA:  Cough.  EXAM: PORTABLE CHEST - 1 VIEW  COMPARISON:  May 28, 2015.  FINDINGS: Stable cardiomegaly. Status post coronary artery bypass graft. No pneumothorax or significant pleural effusion is noted. Left lung appears clear. Mild right basilar subsegmental atelectasis is noted. Bony thorax is intact.  IMPRESSION: Mild right basilar subsegmental atelectasis.   Electronically Signed   By: Marijo Conception, M.D.   On: 07/03/2015 15:25   Assessment/Plan Principal Problem:   Acute on chronic combined systolic and diastolic congestive heart failure Active Problems:   Seizure disorder   CKD (chronic kidney disease) stage 4, GFR 15-29 ml/min   Dementia   Severe anemia   AKI (acute kidney injury)   Type I diabetes mellitus, well controlled   Hyperkalemia   Acute combined systolic and diastolic congestive heart failure: BN{P 3737Last echo July 2015 showing EF of 50% and grade 2 diastolic dysfunction. History of CABG in 2002. Patient is been off of her diuretic since 06/03/2015. Of note patient's recent weights have been 111 pounds on 05/29/2015, 123 pounds on 06/25/2015 and is 127 pounds at time of admission. Of note patient's weight on 05/29/2015 may be below her baseline as she was admitted at that time for AK I and dehydration. Troponin negative. - Telemetry - Strict I's and O's, daily weights - Repeat echo - IV lasix 40mg  BID  AoCKD: Renal function continues to deteriorate. Creatinine 4.47. Baseline around 3. Likely secondary to cardiorenal syndrome. Anticipate this to worsen due to diuresis - Nephrology consult in a.m. - BMP in a.m.  HyperK: Chronic ongoing issue for patient. Patient has not been on diuresis. Worsening renal function. - Kayexalate 1 - BMP in a.m.  Anemia: Hemoglobin 8.9. Baseline. Likely due to worsening renal function. - CBC in a.m.  Questionable UTI: No complaints but patient severely demented. UA showing many  bacteria, WBC too numerous to count, large leukocytes, few squamous cells. - Hold antibiotics at this time - Urine culture  Dementia: at baseline - continue aricept  DM: - SSI - A1c  HTN: -  continue home hydralazine, Imdur, Metop  Szrs: no recent seizure - continue keppra  GERD - continue ppi  HLD - continue Statin  Code Status: FULL DVT Prophylaxis: Hep Family Communication: Daughter [name] Disposition Plan: Pending imrpovement  MERRELL, Grayling Congress, MD Family Medicine [facility] [email or web address] Password [email or web address]

## 2015-07-03 NOTE — ED Notes (Signed)
Pts daughter states that pt has had a cough for the past few days.  States was seen by home health nurse today and told she sounded like she was full of fluid and to bring to ER.

## 2015-07-04 ENCOUNTER — Observation Stay (HOSPITAL_BASED_OUTPATIENT_CLINIC_OR_DEPARTMENT_OTHER): Payer: Medicare Other

## 2015-07-04 DIAGNOSIS — I255 Ischemic cardiomyopathy: Secondary | ICD-10-CM | POA: Diagnosis present

## 2015-07-04 DIAGNOSIS — Z79899 Other long term (current) drug therapy: Secondary | ICD-10-CM | POA: Diagnosis not present

## 2015-07-04 DIAGNOSIS — I132 Hypertensive heart and chronic kidney disease with heart failure and with stage 5 chronic kidney disease, or end stage renal disease: Secondary | ICD-10-CM | POA: Diagnosis present

## 2015-07-04 DIAGNOSIS — E875 Hyperkalemia: Secondary | ICD-10-CM | POA: Diagnosis present

## 2015-07-04 DIAGNOSIS — N2581 Secondary hyperparathyroidism of renal origin: Secondary | ICD-10-CM | POA: Diagnosis present

## 2015-07-04 DIAGNOSIS — N184 Chronic kidney disease, stage 4 (severe): Secondary | ICD-10-CM | POA: Diagnosis not present

## 2015-07-04 DIAGNOSIS — Z951 Presence of aortocoronary bypass graft: Secondary | ICD-10-CM | POA: Diagnosis not present

## 2015-07-04 DIAGNOSIS — N185 Chronic kidney disease, stage 5: Secondary | ICD-10-CM | POA: Diagnosis present

## 2015-07-04 DIAGNOSIS — D631 Anemia in chronic kidney disease: Secondary | ICD-10-CM | POA: Diagnosis present

## 2015-07-04 DIAGNOSIS — I509 Heart failure, unspecified: Secondary | ICD-10-CM | POA: Diagnosis not present

## 2015-07-04 DIAGNOSIS — E860 Dehydration: Secondary | ICD-10-CM | POA: Diagnosis present

## 2015-07-04 DIAGNOSIS — F0390 Unspecified dementia without behavioral disturbance: Secondary | ICD-10-CM | POA: Diagnosis present

## 2015-07-04 DIAGNOSIS — J9611 Chronic respiratory failure with hypoxia: Secondary | ICD-10-CM | POA: Diagnosis present

## 2015-07-04 DIAGNOSIS — Z8673 Personal history of transient ischemic attack (TIA), and cerebral infarction without residual deficits: Secondary | ICD-10-CM | POA: Diagnosis not present

## 2015-07-04 DIAGNOSIS — I120 Hypertensive chronic kidney disease with stage 5 chronic kidney disease or end stage renal disease: Secondary | ICD-10-CM | POA: Diagnosis present

## 2015-07-04 DIAGNOSIS — Z794 Long term (current) use of insulin: Secondary | ICD-10-CM | POA: Diagnosis not present

## 2015-07-04 DIAGNOSIS — I5043 Acute on chronic combined systolic (congestive) and diastolic (congestive) heart failure: Secondary | ICD-10-CM | POA: Diagnosis present

## 2015-07-04 DIAGNOSIS — G40909 Epilepsy, unspecified, not intractable, without status epilepticus: Secondary | ICD-10-CM | POA: Diagnosis present

## 2015-07-04 DIAGNOSIS — K219 Gastro-esophageal reflux disease without esophagitis: Secondary | ICD-10-CM | POA: Diagnosis present

## 2015-07-04 DIAGNOSIS — Z87891 Personal history of nicotine dependence: Secondary | ICD-10-CM | POA: Diagnosis not present

## 2015-07-04 DIAGNOSIS — N189 Chronic kidney disease, unspecified: Secondary | ICD-10-CM | POA: Diagnosis not present

## 2015-07-04 DIAGNOSIS — E1022 Type 1 diabetes mellitus with diabetic chronic kidney disease: Secondary | ICD-10-CM | POA: Diagnosis present

## 2015-07-04 DIAGNOSIS — E782 Mixed hyperlipidemia: Secondary | ICD-10-CM | POA: Diagnosis present

## 2015-07-04 DIAGNOSIS — I252 Old myocardial infarction: Secondary | ICD-10-CM | POA: Diagnosis not present

## 2015-07-04 DIAGNOSIS — N179 Acute kidney failure, unspecified: Secondary | ICD-10-CM | POA: Diagnosis present

## 2015-07-04 DIAGNOSIS — R32 Unspecified urinary incontinence: Secondary | ICD-10-CM | POA: Diagnosis present

## 2015-07-04 DIAGNOSIS — M899 Disorder of bone, unspecified: Secondary | ICD-10-CM | POA: Diagnosis present

## 2015-07-04 DIAGNOSIS — I2510 Atherosclerotic heart disease of native coronary artery without angina pectoris: Secondary | ICD-10-CM | POA: Diagnosis not present

## 2015-07-04 LAB — BLOOD GAS, ARTERIAL
Acid-base deficit: 16.1 mmol/L — ABNORMAL HIGH (ref 0.0–2.0)
Bicarbonate: 10.9 mEq/L — ABNORMAL LOW (ref 20.0–24.0)
Drawn by: 25788
O2 Content: 2 L/min
O2 Saturation: 98.2 %
Patient temperature: 37
TCO2: 10.9 mmol/L (ref 0–100)
pCO2 arterial: 32 mmHg — ABNORMAL LOW (ref 35.0–45.0)
pH, Arterial: 7.158 — CL (ref 7.350–7.450)
pO2, Arterial: 127 mmHg — ABNORMAL HIGH (ref 80.0–100.0)

## 2015-07-04 LAB — BASIC METABOLIC PANEL
ANION GAP: 9 (ref 5–15)
BUN: 77 mg/dL — ABNORMAL HIGH (ref 6–20)
CO2: 13 mmol/L — ABNORMAL LOW (ref 22–32)
Calcium: 8.7 mg/dL — ABNORMAL LOW (ref 8.9–10.3)
Chloride: 120 mmol/L — ABNORMAL HIGH (ref 101–111)
Creatinine, Ser: 4.59 mg/dL — ABNORMAL HIGH (ref 0.44–1.00)
GFR calc Af Amer: 10 mL/min — ABNORMAL LOW (ref >60)
GFR, EST NON AFRICAN AMERICAN: 9 mL/min — AB (ref >60)
Glucose, Bld: 105 mg/dL — ABNORMAL HIGH (ref 65–99)
Potassium: 5 mmol/L (ref 3.5–5.1)
SODIUM: 142 mmol/L (ref 135–145)

## 2015-07-04 LAB — CBC
HEMATOCRIT: 29.5 % — AB (ref 36.0–46.0)
HEMOGLOBIN: 8.7 g/dL — AB (ref 12.0–15.0)
MCH: 27.7 pg (ref 26.0–34.0)
MCHC: 29.5 g/dL — AB (ref 30.0–36.0)
MCV: 93.9 fL (ref 78.0–100.0)
Platelets: 200 10*3/uL (ref 150–400)
RBC: 3.14 MIL/uL — ABNORMAL LOW (ref 3.87–5.11)
RDW: 19.7 % — ABNORMAL HIGH (ref 11.5–15.5)
WBC: 4.9 10*3/uL (ref 4.0–10.5)

## 2015-07-04 LAB — GLUCOSE, CAPILLARY
GLUCOSE-CAPILLARY: 134 mg/dL — AB (ref 65–99)
GLUCOSE-CAPILLARY: 105 mg/dL — AB (ref 65–99)
GLUCOSE-CAPILLARY: 125 mg/dL — AB (ref 65–99)
Glucose-Capillary: 102 mg/dL — ABNORMAL HIGH (ref 65–99)

## 2015-07-04 MED ORDER — SODIUM BICARBONATE 8.4 % IV SOLN
INTRAVENOUS | Status: AC
  Filled 2015-07-04: qty 50

## 2015-07-04 MED ORDER — SODIUM CHLORIDE 0.45 % IV SOLN: INTRAVENOUS | Status: DC

## 2015-07-04 MED ORDER — SODIUM CHLORIDE 0.45 % IV SOLN
INTRAVENOUS | Status: DC
  Administered 2015-07-04 – 2015-07-12 (×13): via INTRAVENOUS
  Filled 2015-07-04 (×31): qty 1000

## 2015-07-04 MED ORDER — STERILE WATER FOR INJECTION IV SOLN
INTRAVENOUS | Status: DC
  Filled 2015-07-04 (×2): qty 9.7

## 2015-07-04 MED ORDER — CETYLPYRIDINIUM CHLORIDE 0.05 % MT LIQD
7.0000 mL | Freq: Two times a day (BID) | OROMUCOSAL | Status: DC
  Administered 2015-07-04 – 2015-07-13 (×18): 7 mL via OROMUCOSAL

## 2015-07-04 NOTE — Progress Notes (Signed)
CRITICAL VALUE ALERT  Critical value received:  PH 7.158 CO2 32  Date of notification:  07/04/15  Time of notification:  1615  Critical value read back:YES   Nurse who received alert:  E.Justyn Langham RN  MD notified (1st page):  Dr. Jerilee Hoh  Time of first page:  1619  MD notified (2nd page):  Time of second page:  Responding MD: Dr. Jerilee Hoh  Time MD responded:  [phone]

## 2015-07-04 NOTE — Progress Notes (Signed)
[facility] PROGRESS NOTE  [name] IOE:[id number] DOB: 1941-05-29 DOA: 07/03/2015 PCP: [name], MD  Assessment/Plan: Acute on chronic combined systolic and diastolic CHF - Most recent ECHO done July 2015 revealing ejection fraction of 45-50% with grade 2 diastolic function  - Very elevated BNP 3700 -Continue Lasix 40mg  IVBID - Strive for negative fluid balance, I/O not accurately documented.  Have discussed with RN.   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  Hyperkalemia - resolved after Kayexalete , suspect worsening due to renal failure  - recheck in morning   Anemia - chronic disease anemia related to renal failure - HGB is at baseline 8.9, no need to transfuse unless less than 7   Dementia -at baseline, continue Aricept   DM -well controlled -Continue current management   HTN - Fair controlled, do not anticipate any medication change in hospital, continue Imdur, metoprolol  GERD -continue PPI   HLD -continue statin  Seizure disorder - no seizures while in hospital, continue Kepra  Code Status: FULL DVT prophylaxis : Heparin  Family Communication: Patient only  Disposition Plan: To be determined, suspect home in 2-3 days.   Consultants:  Nephrology   Antibiotics:  None  Subjective: Reports no SOB, n/v. Denies pain. Not sure why she is in hospital  Objective: Filed Vitals:   07/03/15 1800 07/03/15 1938 07/03/15 2110 07/04/15 0537  BP: 159/60 129/53 140/51 122/53  Pulse:  59 58 62  Temp:  97.9 F (36.6 C) 97.7 F (36.5 C) 97.8 F (36.6 C)  TempSrc:  Oral Oral Oral  Resp: 16 15 16    Height:      Weight:  58.6 kg (129 lb 3 oz)    SpO2:  96% 100% 100%   No intake or output data in the 24 hours ending 07/04/15 0656 Filed Weights   07/03/15 1454 07/03/15 1938  Weight: 57.607 kg (127 lb) 58.6 kg (129 lb 3 oz)    Exam:   General:  Appears calm and  comfortable, lying in bed, afebrile  Cardiovascular: Regular rate, systolic mumur, no rubs or gallops  Respiratory: CTAB, no wheezing  Abdomen: soft, no TTP, no distension, bowel sounds present  Extremities: trace edema  Neurologic:  Non focal  Data Reviewed: Basic Metabolic Panel:  Recent Labs Lab 07/03/15 1514  NA 139  K 5.4*  CL 121*  CO2 12*  GLUCOSE 144*  BUN 77*  CREATININE 4.47*  CALCIUM 8.7*   Liver Function Tests:  Recent Labs Lab 07/03/15 1514  AST 28  ALT 19  ALKPHOS 102  BILITOT 0.5  PROT 8.9*  ALBUMIN 3.4*   No results for input(s): LIPASE, AMYLASE in the last 168 hours. No results for input(s): AMMONIA in the last 168 hours. CBC:  Recent Labs Lab 07/03/15 1514  WBC 6.0  NEUTROABS 3.9  HGB 8.9*  HCT 30.4*  MCV 95.3  PLT 205   Cardiac Enzymes:  Recent Labs Lab 07/03/15 1514  TROPONINI 0.03   BNP (last 3 results)  Recent Labs  01/02/15 1125 05/28/15 1148 07/03/15 1514  BNP >4500.0* 1084.0* 3737.0*    ProBNP (last 3 results) No results for input(s): PROBNP in the last 8760 hours.  CBG:  Recent Labs Lab 07/03/15 2143  GLUCAP 97    No results found for this or any previous visit (from the past 240 hour(s)).   Studies: Dg Chest Port 1 View  07/03/2015   CLINICAL DATA:  Cough.  EXAM: PORTABLE CHEST - 1 VIEW  COMPARISON:  May 28, 2015.  FINDINGS: Stable cardiomegaly. Status post coronary artery bypass graft. No pneumothorax or significant pleural effusion is noted. Left lung appears clear. Mild right basilar subsegmental atelectasis is noted. Bony thorax is intact.  IMPRESSION: Mild right basilar subsegmental atelectasis.   Electronically Signed   By: Marijo Conception, M.D.   On: 07/03/2015 15:25    Scheduled Meds: . antiseptic oral rinse  7 mL Mouth Rinse BID  . calcitRIOL  0.25 mcg Oral Daily  . donepezil  10 mg Oral QHS  . furosemide  40 mg Intravenous BID  . heparin  5,000 Units Subcutaneous 3 times per day  .  hydrALAZINE  75 mg Oral 3 times per day  . insulin aspart  0-5 Units Subcutaneous QHS  . insulin aspart  0-9 Units Subcutaneous TID WC  . isosorbide mononitrate  15 mg Oral q morning - 10a  . levETIRAcetam  500 mg Oral BID  . metoprolol succinate  50 mg Oral Daily  . pantoprazole  40 mg Oral q morning - 10a  . pravastatin  20 mg Oral QHS  . sodium chloride  3 mL Intravenous Q12H  . sodium chloride  3 mL Intravenous Q12H   Continuous Infusions:   Principal Problem:   Acute on chronic combined systolic and diastolic congestive heart failure Active Problems:   Seizure disorder   CKD (chronic kidney disease) stage 4, GFR 15-29 ml/min   Dementia   Severe anemia   AKI (acute kidney injury)   Type I diabetes mellitus, well controlled   Hyperkalemia    Time spent: 30  minutes Greater than 50% of this time was spent in direct contact with the patient coordinating care.    Domingo Mend, MD [facility] Pager: [phone]  If 7PM-7AM, please contact night-coverage at [email or web address], password [facility] 07/04/2015, 6:56 AM      I, Salvadore Oxford, acting a scribe, recorded this note contemporaneously in the presence of Dr. Domingo Mend on 07/04/2015     I have reviewed the above documentation for accuracy and completeness, and I agree with the above.  Domingo Mend, MD [facility] Pager: [phone]

## 2015-07-04 NOTE — Care Management Note (Addendum)
Case Management Note  Patient Details  Name: [name] MRN: [id number] Date of Birth: Dec 03, 1940  Expected Discharge Date:  07/06/15               Expected Discharge Plan:  [place]  In-House Referral:  NA  Discharge planning Services  CM Consult  Post Acute Care Choice:  Resumption of Svcs/PTA Provider Choice offered to:  Adult Children  DME Arranged:    DME Agency:     HH Arranged:  RN [place] Agency:  [place]  Status of Service:  In process, will continue to follow  Medicare Important Message Given:    Date Medicare IM Given:    Medicare IM give by:    Date Additional Medicare IM Given:    Additional Medicare Important Message give by:     If discussed at [place] of Stay Meetings, dates discussed:    Additional Comments: Pt is from home, lives with daughter [name]. Pt has CAP aid and HH RN through [facility]. [facility] is aware of admission. Pt plans to return home with [facility] services through [facility] at discharge. Pt will need order to resume [place] services. Pt has home O2 24/7. Pt says she has no new DME needs this admission. CM to continue to follow for DC planning.  Sherald Barge, RN 07/04/2015, 8:12 AM

## 2015-07-04 NOTE — Consult Note (Signed)
[name] MRN: [id number] DOB/AGE: 1941/08/16 74 y.o. Primary Care Physician:HILL,GERALD K, MD Admit date: 07/03/2015 Chief Complaint:  Chief Complaint  Patient presents with  . Shortness of Breath   HPI: Pt is  74 year old female with past medical hx of DM type 2 , Ch resp failure who was brought to ER with c/o respiratory distress.  HPI dates back to last few days, when pt  was noticed to getting short of breath by pt daughter and home nursing aide. Pt herself does not offer any complaints. Pt says " I was just laying and they brought me " Pt was brought to ER yesterday and was noticed to have dyspnea and  weight gain. Pt was admitted for further tx NO c/o chest pain  No c/o fever/cough/chills  NO c/o diarrhea NO c/o nausea/vomiting NO c/o seizures   Past Medical History  Diagnosis Date  . Type 2 diabetes mellitus   . Essential hypertension, benign   . History of stroke     Previously on Coumadin  . MI, old     Reported 38  . Seizure disorder   . Coronary atherosclerosis of native coronary artery     Multivessel status post CABG 2002  . History of GI bleed     Erosive gastritis and duodenitis by EGD 2002  . Mixed hyperlipidemia   . Ischemic cardiomyopathy     LVEF 40-45% March 2013  . Dementia   . CKD (chronic kidney disease) stage 4, GFR 15-29 ml/min   . CHF (congestive heart failure)   . Chronic respiratory failure with hypoxia   . Stroke   . Seizures   . Anemia of chronic renal failure, stage 4 (severe) 01/02/2015        Family History  Problem Relation Age of Onset  . Stroke Father   . Diabetes type II Mother   . Colon cancer Neg Hx     Social History:  reports that she quit smoking about 3 years ago. Her smoking use included Cigarettes. She started smoking about 54 years ago. She has a 50 pack-year smoking history. She has never used smokeless tobacco. She reports that she does not drink alcohol or use illicit drugs.   Allergies: No Known  Allergies  Medications Prior to Admission  Medication Sig Dispense Refill  . calcitRIOL (ROCALTROL) 0.25 MCG capsule Take 1 capsule (0.25 mcg total) by mouth daily. 31 capsule 0  . donepezil (ARICEPT) 10 MG tablet Take 10 mg by mouth at bedtime.     . folic acid (FOLVITE) 1 MG tablet Take 1 mg by mouth every morning.     Marland Kitchen HUMALOG 100 UNIT/ML injection Inject 2 Units into the skin 4 (four) times daily -  with meals and at bedtime.     . hydrALAZINE (APRESOLINE) 25 MG tablet Take 75 mg by mouth every 8 (eight) hours.     . insulin aspart (NOVOLOG) 100 UNIT/ML injection Inject 2 Units into the skin 3 (three) times daily with meals. 10 mL 11  . insulin detemir (LEVEMIR) 100 UNIT/ML injection Inject 0.06 mLs (6 Units total) into the skin at bedtime. (Patient taking differently: Inject 4 Units into the skin at bedtime. ) 10 mL 11  . isosorbide mononitrate (IMDUR) 30 MG 24 hr tablet Take 0.5 tablets (15 mg total) by mouth every morning. 30 tablet 0  . levETIRAcetam (KEPPRA) 100 MG/ML solution Take 500 mg by mouth 2 (two) times daily.     Marland Kitchen linagliptin (TRADJENTA)  5 MG TABS tablet Take 5 mg by mouth every morning.     . Maltodextrin-Xanthan Gum (RESOURCE THICKENUP CLEAR) POWD Take 120 g by mouth as needed. (Patient taking differently: Take 1 Can by mouth daily. )    . metoprolol succinate (TOPROL-XL) 100 MG 24 hr tablet Take 50 mg by mouth daily. Take with or immediately following a meal.    . mupirocin ointment (BACTROBAN) 2 % Apply 1 application topically 2 (two) times daily.     . pantoprazole (PROTONIX) 40 MG tablet Take 40 mg by mouth every morning.    . pravastatin (PRAVACHOL) 20 MG tablet Take 20 mg by mouth at bedtime.          QVZ:DGLOV from the symptoms mentioned above,there are no other symptoms referable to all systems reviewed.     Physical Exam: Vital signs in last 24 hours: Temp:  [97.7 F (36.5 C)-97.9 F (36.6 C)] 97.8 F (36.6 C) (08/10 0537) Pulse Rate:  [51-67] 67  (08/10 0949) Resp:  [15-20] 16 (08/09 2110) BP: (122-159)/(48-67) 125/48 mmHg (08/10 0949) SpO2:  [96 %-100 %] 100 % (08/10 0738) Weight:  [127 lb (57.607 kg)-130 lb 12.8 oz (59.33 kg)] 130 lb 12.8 oz (59.33 kg) (08/10 0537) Weight change:     Intake/Output from previous day:       Physical Exam: General- pt is  Awake, following comands intermittently Resp- No acute REsp distress, NO Rhonchi CVS- S1S2 regular in rate and rhythm GIT- BS+, soft, NT, ND EXT- NO LE Edema, NO Cyanosis CNS- CN 2-12 grossly intact. Moving all 4 extremities     Lab Results: CBC  Recent Labs  07/03/15 1514 07/04/15 0539  WBC 6.0 4.9  HGB 8.9* 8.7*  HCT 30.4* 29.5*  PLT 205 200    BMET  Recent Labs  07/03/15 1514 07/04/15 0539  NA 139 142  K 5.4* 5.0  CL 121* 120*  CO2 12* 13*  GLUCOSE 144* 105*  BUN 77* 77*  CREATININE 4.47* 4.59*  CALCIUM 8.7* 8.7*   Creat trend  2016  4.47=>4.59           7.16=>3.0( July admission)           4.93=>3.17( June admission)           4.29=>3.64=>4.10=>3.95 ( Jan/Feb admission) 2015 2.0--3.75( Multiple admission) 2014 1.4--2.5 20131.5--1.7 20121.26    MICRO No results found for this or any previous visit (from the past 240 hour(s)).    Lab Results  Component Value Date   PTH 268.4* 04/28/2014   CALCIUM 8.7* 07/04/2015   PHOS 4.4 05/30/2015    CXR Cardiomegaly with interval improved lung volumes and mildly decreased vascular congestion, no overt edema.  Impression: 1)Renal  AKI sec to  ATN/Cardiorenal                AKI on CKD                CKD stage 4 .                Creat at baseline  CKD since 2013  CKD secondary to DM/HTN/Age associated decline  Progression of CKD marked with multiple AKI in 2015 and now in 2016  Proteinuria Present in U/a  2)HTN  Medication- On Diuretics. On Beta blockers On Vasodilators  3)Anemia HGb at goal (9--11) On ESA  4)CKD  Mineral-Bone Disorder PTH elevated. Secondary Hyperparathyroidism present. Phosphorus at goal.    5)Electrolytes Hyperkalemic   Now better Normonatremic  6)Acid base Co2 at goal  7) CHF- admitted with CHF exacerbation    Now better    NO  LE edema, NO Pul congestion   CXr does no show much fluid  Plan:  Will start IVF( add bicarb) Will ask for ABG as Co2 low Will continue current dosage of diuretics. Will follow bmet     [place] S 07/04/2015, 10:02 AM

## 2015-07-05 LAB — BASIC METABOLIC PANEL
ANION GAP: 7 (ref 5–15)
BUN: 79 mg/dL — ABNORMAL HIGH (ref 6–20)
CALCIUM: 8.3 mg/dL — AB (ref 8.9–10.3)
CO2: 13 mmol/L — ABNORMAL LOW (ref 22–32)
CREATININE: 4.86 mg/dL — AB (ref 0.44–1.00)
Chloride: 122 mmol/L — ABNORMAL HIGH (ref 101–111)
GFR calc Af Amer: 9 mL/min — ABNORMAL LOW (ref >60)
GFR calc non Af Amer: 8 mL/min — ABNORMAL LOW (ref >60)
Glucose, Bld: 119 mg/dL — ABNORMAL HIGH (ref 65–99)
Potassium: 4.7 mmol/L (ref 3.5–5.1)
Sodium: 142 mmol/L (ref 135–145)

## 2015-07-05 LAB — GLUCOSE, CAPILLARY
GLUCOSE-CAPILLARY: 149 mg/dL — AB (ref 65–99)
Glucose-Capillary: 108 mg/dL — ABNORMAL HIGH (ref 65–99)
Glucose-Capillary: 141 mg/dL — ABNORMAL HIGH (ref 65–99)
Glucose-Capillary: 126 mg/dL — ABNORMAL HIGH (ref 65–99)

## 2015-07-05 LAB — CBC
HCT: 28.8 % — ABNORMAL LOW (ref 36.0–46.0)
HEMOGLOBIN: 8.6 g/dL — AB (ref 12.0–15.0)
MCH: 27.9 pg (ref 26.0–34.0)
MCHC: 29.9 g/dL — ABNORMAL LOW (ref 30.0–36.0)
MCV: 93.5 fL (ref 78.0–100.0)
PLATELETS: 199 10*3/uL (ref 150–400)
RBC: 3.08 MIL/uL — ABNORMAL LOW (ref 3.87–5.11)
RDW: 19.7 % — AB (ref 11.5–15.5)
WBC: 4.9 10*3/uL (ref 4.0–10.5)

## 2015-07-05 LAB — HEMOGLOBIN A1C
Hgb A1c MFr Bld: 5.4 % (ref 4.8–5.6)
Mean Plasma Glucose: 108 mg/dL

## 2015-07-05 NOTE — Progress Notes (Signed)
Subjective: Patient offers no complaints. She denies any difficulty breathing.   Objective: Vital signs in last 24 hours: Temp:  [97.7 F (36.5 C)-98.5 F (36.9 C)] 98.3 F (36.8 C) (08/11 0549) Pulse Rate:  [63-67] 63 (08/11 0549) Resp:  [20] 20 (08/11 0549) BP: (124-133)/(44-52) 128/47 mmHg (08/11 0549) SpO2:  [100 %] 100 % (08/11 0549) Weight:  [128 lb (58.06 kg)] 128 lb (58.06 kg) (08/11 0549)  Intake/Output from previous day: 08/10 0701 - 08/11 0700 In: 600 [P.O.:600] Out: -  Intake/Output this shift:     Recent Labs  07/03/15 1514 07/04/15 0539 07/05/15 0635  HGB 8.9* 8.7* 8.6*    Recent Labs  07/04/15 0539 07/05/15 0635  WBC 4.9 4.9  RBC 3.14* 3.08*  HCT 29.5* 28.8*  PLT 200 199    Recent Labs  07/04/15 0539 07/05/15 0635  NA 142 142  K 5.0 4.7  CL 120* 122*  CO2 13* 13*  BUN 77* 79*  CREATININE 4.59* 4.86*  GLUCOSE 105* 119*  CALCIUM 8.7* 8.3*   No results for input(s): LABPT, INR in the last 72 hours.  Generally patient seems to be somewhat sleepy but arousable. Chest: Decreased breath sound, no wheezing Heart exam revealed regular rate and rhythm no murmur Extremities: No edema  Assessment/Plan: Problem #1 acute kidney injury superimposed on chronic: Presently her renal function seems to be getting worse. At this moment so not able to start her IV fluid because of lack of access. Patient has been admitted multiple times with similar problems developing acute on chronic kidney disease most of the time secondary to prerenal syndrome versus ATN. Problem #2 metabolic acidosis: Patient started on IV fluid with sodium bicarbonate however presently she's not getting it. Problem #3 difficulty breathing: Patient with ischemic cardiomyopathy presently she is asymptomatic. Problem #4 anemia Problem #5 chronic renal failure: Stage IV. It was thought to be secondary to diabetes/hypertension/cardiorenal Problem #6 history of dementia Problem #7  hyperkalemia: That has resolved. Plan: We'll increase her IV fluids to 100 mL per hour once patient has an access is placed. 2] will use Lasix 40 mg IV twice a day 3] we'll check her basic metabolic panel in the morning.   [name] S 07/05/2015, 8:15 AM

## 2015-07-05 NOTE — Progress Notes (Signed)
[facility] PROGRESS NOTE  [name] VVO:[id number] DOB: July 10, 1941 DOA: 07/03/2015 PCP: [name], MD  Assessment/Plan: Acute on chronic combined systolic and diastolic CHF - Most recent ECHO done July 2015 revealing ejection fraction of 45-50% with grade 2 diastolic function  - Very elevated BNP 3700 -Continue Lasix 40mg  IVBID -Difficulty getting accurate I&O as she is incontinent  Acute on chronic CKD stage IV-V -Expect some worsening with diuresis  - Appreciate  Nephrology recommendations, continue Lasix and IVF  Hyperkalemia - resolved after Kayexalete , suspect worsening due to renal failure     Anemia - chronic disease anemia related to renal failure - HGB is 8.6,07/05/15, no need to transfuse unless less than 7   Dementia -at baseline, continue Aricept   DM -well controlled -Continue current management   HTN - Fair controlled, do not anticipate any medication change in hospital, continue Imdur, metoprolol  GERD -continue PPI   HLD -continue statin  Seizure disorder - no seizures while in hospital, continue Kepra  Code Status: FULL DVT prophylaxis : Heparin  Family Communication: Discussed with patient who understands and has no concerns at this time. Disposition Plan:  Discharge in 2-3 days   Consultants:  Nephrology   Antibiotics:  None  Subjective: Feeling better. No reports of chest pain, shortness of breath, n/v/d, or abd pain.  Objective: Filed Vitals:   07/04/15 1505 07/04/15 2050 07/05/15 0536 07/05/15 0549  BP: 129/52 133/52 128/44 128/47  Pulse: 65 67  63  Temp: 98.5 F (36.9 C) 97.7 F (36.5 C)  98.3 F (36.8 C)  TempSrc: Oral Oral  Oral  Resp: 20 20  20   Height:      Weight:    58.06 kg (128 lb)  SpO2: 100% 100%  100%    Intake/Output Summary (Last 24 hours) at 07/05/15 0635 Last data filed at 07/04/15 1907  Gross per 24 hour  Intake    600 ml  Output      0 ml  Net    600 ml   Filed Weights     07/03/15 1938 07/04/15 0537 07/05/15 0549  Weight: 58.6 kg (129 lb 3 oz) 59.33 kg (130 lb 12.8 oz) 58.06 kg (128 lb)    Exam:  General:  NAD, appears calm and comfortable, lying in bed, afebrile  Cardiovascular: RRR, no m/r/g  Respiratory: CTAB, no w/r/r  Abdomen: soft, positive bowel sounds, no distension  Extremities: no LE edema  Neurologic:  Non focal  Data Reviewed: Basic Metabolic Panel:  Recent Labs Lab 07/03/15 1514 07/04/15 0539  NA 139 142  K 5.4* 5.0  CL 121* 120*  CO2 12* 13*  GLUCOSE 144* 105*  BUN 77* 77*  CREATININE 4.47* 4.59*  CALCIUM 8.7* 8.7*   Liver Function Tests:  Recent Labs Lab 07/03/15 1514  AST 28  ALT 19  ALKPHOS 102  BILITOT 0.5  PROT 8.9*  ALBUMIN 3.4*   No results for input(s): LIPASE, AMYLASE in the last 168 hours. No results for input(s): AMMONIA in the last 168 hours. CBC:  Recent Labs Lab 07/03/15 1514 07/04/15 0539  WBC 6.0 4.9  NEUTROABS 3.9  --   HGB 8.9* 8.7*  HCT 30.4* 29.5*  MCV 95.3 93.9  PLT 205 200   Cardiac Enzymes:  Recent Labs Lab 07/03/15 1514  TROPONINI 0.03   BNP (last 3 results)  Recent Labs  01/02/15 1125 05/28/15 1148 07/03/15 1514  BNP >4500.0* 1084.0* 3737.0*    ProBNP (last 3  results) No results for input(s): PROBNP in the last 8760 hours.  CBG:  Recent Labs Lab 07/03/15 2143 07/04/15 0729 07/04/15 1106 07/04/15 1630 07/04/15 2049  GLUCAP 97 102* 134* 105* 125*    No results found for this or any previous visit (from the past 240 hour(s)).   Studies: Dg Chest Port 1 View  07/03/2015   CLINICAL DATA:  Cough.  EXAM: PORTABLE CHEST - 1 VIEW  COMPARISON:  May 28, 2015.  FINDINGS: Stable cardiomegaly. Status post coronary artery bypass graft. No pneumothorax or significant pleural effusion is noted. Left lung appears clear. Mild right basilar subsegmental atelectasis is noted. Bony thorax is intact.  IMPRESSION: Mild right basilar subsegmental atelectasis.    Electronically Signed   By: Marijo Conception, M.D.   On: 07/03/2015 15:25    Scheduled Meds: . antiseptic oral rinse  7 mL Mouth Rinse BID  . calcitRIOL  0.25 mcg Oral Daily  . donepezil  10 mg Oral QHS  . furosemide  40 mg Intravenous BID  . heparin  5,000 Units Subcutaneous 3 times per day  . hydrALAZINE  75 mg Oral 3 times per day  . insulin aspart  0-5 Units Subcutaneous QHS  . insulin aspart  0-9 Units Subcutaneous TID WC  . isosorbide mononitrate  15 mg Oral q morning - 10a  . levETIRAcetam  500 mg Oral BID  . metoprolol succinate  50 mg Oral Daily  . pantoprazole  40 mg Oral q morning - 10a  . pravastatin  20 mg Oral QHS  . sodium chloride  3 mL Intravenous Q12H  . sodium chloride  3 mL Intravenous Q12H   Continuous Infusions: . sodium chloride 0.45 % 1,000 mL with sodium bicarbonate 50 mEq infusion 75 mL/hr at 07/04/15 2029    Principal Problem:   Acute on chronic combined systolic and diastolic congestive heart failure Active Problems:   Seizure disorder   CKD (chronic kidney disease) stage 4, GFR 15-29 ml/min   Dementia   CHF (congestive heart failure)   Severe anemia   AKI (acute kidney injury)   Type I diabetes mellitus, well controlled   Hyperkalemia    Time spent: 20 minutes Greater than 50% of this time was spent in direct contact with the patient coordinating care.    Domingo Mend, MD [facility] Pager: [phone]  If 7PM-7AM, please contact night-coverage at [email or web address], password [facility] 07/05/2015, 6:35 AM  LOS: 1 day     I, Jessica D. Leonie Green, acting as scribe, recorded this note contemporaneously in the presence of Dr. Lelon Frohlich, M.D. on 07/05/2015.   I have reviewed the above documentation for accuracy and completeness, and I agree with the above.  Domingo Mend, MD [facility] Pager: [phone]

## 2015-07-06 ENCOUNTER — Inpatient Hospital Stay (HOSPITAL_COMMUNITY): Payer: Medicare Other

## 2015-07-06 LAB — CBC
HCT: 32 % — ABNORMAL LOW (ref 36.0–46.0)
HEMOGLOBIN: 9.5 g/dL — AB (ref 12.0–15.0)
MCH: 27.8 pg (ref 26.0–34.0)
MCHC: 29.7 g/dL — AB (ref 30.0–36.0)
MCV: 93.6 fL (ref 78.0–100.0)
Platelets: 191 10*3/uL (ref 150–400)
RBC: 3.42 MIL/uL — ABNORMAL LOW (ref 3.87–5.11)
RDW: 20.2 % — AB (ref 11.5–15.5)
WBC: 4.7 10*3/uL (ref 4.0–10.5)

## 2015-07-06 LAB — GLUCOSE, CAPILLARY
GLUCOSE-CAPILLARY: 109 mg/dL — AB (ref 65–99)
Glucose-Capillary: 99 mg/dL (ref 65–99)
Glucose-Capillary: 127 mg/dL — ABNORMAL HIGH (ref 65–99)
Glucose-Capillary: 170 mg/dL — ABNORMAL HIGH (ref 65–99)

## 2015-07-06 LAB — BASIC METABOLIC PANEL
ANION GAP: 6 (ref 5–15)
BUN: 81 mg/dL — ABNORMAL HIGH (ref 6–20)
CALCIUM: 8.1 mg/dL — AB (ref 8.9–10.3)
CO2: 14 mmol/L — AB (ref 22–32)
Chloride: 120 mmol/L — ABNORMAL HIGH (ref 101–111)
Creatinine, Ser: 5.13 mg/dL — ABNORMAL HIGH (ref 0.44–1.00)
GFR calc Af Amer: 9 mL/min — ABNORMAL LOW (ref >60)
GFR, EST NON AFRICAN AMERICAN: 7 mL/min — AB (ref >60)
Glucose, Bld: 110 mg/dL — ABNORMAL HIGH (ref 65–99)
Potassium: 4.8 mmol/L (ref 3.5–5.1)
Sodium: 140 mmol/L (ref 135–145)

## 2015-07-06 MED ORDER — FUROSEMIDE 10 MG/ML IJ SOLN
100.0000 mg | Freq: Two times a day (BID) | INTRAVENOUS | Status: DC
  Filled 2015-07-06 (×6): qty 10

## 2015-07-06 MED ORDER — FUROSEMIDE 10 MG/ML IJ SOLN
100.0000 mg | Freq: Two times a day (BID) | INTRAVENOUS | Status: DC
  Administered 2015-07-06 – 2015-07-11 (×11): 100 mg via INTRAVENOUS
  Filled 2015-07-06 (×13): qty 10

## 2015-07-06 NOTE — Consult Note (Signed)
   [facility] CM Inpatient Consult   07/06/2015  Crystal May 01, 1941 [id number]   Patient is active with [place] [facility] services. Please see chart review tab in EPIC then notes for further [place] patient outreach details. Sent notification to inpatient RNCM to make aware patient is active with [facility]. Also sent notification to [place] to make aware of admission. Will continue to follow.  Marthenia Rolling, MSN-Ed, RN,BSN [facility] Liaison [phone]

## 2015-07-06 NOTE — Progress Notes (Signed)
[facility] PROGRESS NOTE  [name] GNF:[id number] DOB: 1941-03-01 DOA: 07/03/2015 PCP: [name], MD  Assessment/Plan: Acute on chronic combined systolic and diastolic CHF - Most recent ECHO done July 2015 revealing ejection fraction of 45-50% with grade 2 diastolic function  - Very elevated BNP 3700 on admisison -Seen by Nephrology, who plans to increase Lasix to 100mg  BID and start IVF at 125cc an hour.  -Difficulty getting accurate I&O as she is incontinent, will place foley catheter.  -Given findings on chest ausculation will order CXR.  Acute on chronic CKD stage IV-V -Creatinine worse today, see above for Nephrology recommendations.   Hyperkalemia - Resolved after Kayexalete , suspect worsening due to renal failure   Metabolic acidosis -Secondary to advance CKD. -Receiving bicarb in fluids.   Anemia - Chronic disease anemia related to renal failure - HGB is 9.5, no need to transfuse unless less than 7  Dementia -At baseline, continue Aricept  DM -Well controlled -Continue current management   HTN - Fair control, do not anticipate any medication change in hospital, continue Imdur, metoprolol  GERD -continue PPI   HLD -Continue statin  Seizure disorder - no seizures while in hospital, continue Kepra  Code Status: FULL DVT prophylaxis : Heparin  Family Communication: Discussed with patient who understands and has no concerns at this time. Disposition Plan:  Discharge home, pending improvement    Consultants:  Nephrology  Antibiotics:  None  Subjective: Feeling pretty good. No reports of chest pain, shortness of breath, n/v/d, or abd pain.  Objective: Filed Vitals:   07/05/15 0549 07/05/15 1426 07/05/15 2020 07/06/15 0531  BP: 128/47 142/54 132/68 156/69  Pulse: 63 62 64 60  Temp: 98.3 F (36.8 C) 97.4 F (36.3 C) 97.4 F (36.3 C) 97.8 F (36.6 C)  TempSrc: Oral Oral Oral Axillary  Resp: 20 20 20 20   Height:        Weight: 58.06 kg (128 lb)   60.555 kg (133 lb 8 oz)  SpO2: 100% 100% 100% 90%    Intake/Output Summary (Last 24 hours) at 07/06/15 0646 Last data filed at 07/06/15 0500  Gross per 24 hour  Intake 3691.25 ml  Output      0 ml  Net 3691.25 ml   Filed Weights   07/04/15 0537 07/05/15 0549 07/06/15 0531  Weight: 59.33 kg (130 lb 12.8 oz) 58.06 kg (128 lb) 60.555 kg (133 lb 8 oz)    Exam:  General:  NAD, appears calm and comfortable, lying in bed, afebrile  Cardiovascular: RRR, no m/r/g  Respiratory: Bilateral crackles at the bases.   Abdomen: soft, positive bowel sounds, no distension  Extremities: no LE edema  Neurologic:  Non focal  Data Reviewed: Basic Metabolic Panel:  Recent Labs Lab 07/03/15 1514 07/04/15 0539 07/05/15 0635  NA 139 142 142  K 5.4* 5.0 4.7  CL 121* 120* 122*  CO2 12* 13* 13*  GLUCOSE 144* 105* 119*  BUN 77* 77* 79*  CREATININE 4.47* 4.59* 4.86*  CALCIUM 8.7* 8.7* 8.3*   Liver Function Tests:  Recent Labs Lab 07/03/15 1514  AST 28  ALT 19  ALKPHOS 102  BILITOT 0.5  PROT 8.9*  ALBUMIN 3.4*   No results for input(s): LIPASE, AMYLASE in the last 168 hours. No results for input(s): AMMONIA in the last 168 hours. CBC:  Recent Labs Lab 07/03/15 1514 07/04/15 0539 07/05/15 0635  WBC 6.0 4.9 4.9  NEUTROABS 3.9  --   --   HGB  8.9* 8.7* 8.6*  HCT 30.4* 29.5* 28.8*  MCV 95.3 93.9 93.5  PLT 205 200 199   Cardiac Enzymes:  Recent Labs Lab 07/03/15 1514  TROPONINI 0.03   BNP (last 3 results)  Recent Labs  01/02/15 1125 05/28/15 1148 07/03/15 1514  BNP >4500.0* 1084.0* 3737.0*    ProBNP (last 3 results) No results for input(s): PROBNP in the last 8760 hours.  CBG:  Recent Labs Lab 07/04/15 2049 07/05/15 0738 07/05/15 1127 07/05/15 1627 07/05/15 2022  GLUCAP 125* 108* 149* 141* 126*    No results found for this or any previous visit (from the past 240 hour(s)).   Studies: No results found.  Scheduled  Meds: . antiseptic oral rinse  7 mL Mouth Rinse BID  . calcitRIOL  0.25 mcg Oral Daily  . donepezil  10 mg Oral QHS  . furosemide  40 mg Intravenous BID  . heparin  5,000 Units Subcutaneous 3 times per day  . hydrALAZINE  75 mg Oral 3 times per day  . insulin aspart  0-5 Units Subcutaneous QHS  . insulin aspart  0-9 Units Subcutaneous TID WC  . isosorbide mononitrate  15 mg Oral q morning - 10a  . levETIRAcetam  500 mg Oral BID  . metoprolol succinate  50 mg Oral Daily  . pantoprazole  40 mg Oral q morning - 10a  . pravastatin  20 mg Oral QHS  . sodium chloride  3 mL Intravenous Q12H  . sodium chloride  3 mL Intravenous Q12H   Continuous Infusions: . sodium chloride 0.45 % 1,000 mL with sodium bicarbonate 50 mEq infusion 100 mL/hr at 07/05/15 2258    Principal Problem:   Acute on chronic combined systolic and diastolic congestive heart failure Active Problems:   Seizure disorder   CKD (chronic kidney disease) stage 4, GFR 15-29 ml/min   Dementia   CHF (congestive heart failure)   Severe anemia   AKI (acute kidney injury)   Type I diabetes mellitus, well controlled   Hyperkalemia    Time spent: 30 minutes Greater than 50% of this time was spent in direct contact with the patient coordinating care.    Domingo Mend, MD [facility] Pager: [phone]  If 7PM-7AM, please contact night-coverage at [email or web address], password [facility] 07/06/2015, 6:46 AM  LOS: 2 days     I, Jessica D. Leonie Green, acting as scribe, recorded this note contemporaneously in the presence of Dr. Lelon Frohlich, M.D. on 07/06/2015.   I have reviewed the above documentation for accuracy and completeness, and I agree with the above.  Domingo Mend, MD [facility] Pager: [phone]

## 2015-07-06 NOTE — Care Management Note (Signed)
Case Management Note  Patient Details  Name: JOLENE GUYETT MRN: [id number] Date of Birth: 02-12-1941  Expected Discharge Date:  07/08/2015             Expected Discharge Plan:  [place]  In-House Referral:  NA  Discharge planning Services  CM Consult  Post Acute Care Choice:  Resumption of Svcs/PTA Provider Choice offered to:  Adult Children  DME Arranged:    DME Agency:     HH Arranged:  RN Raoul Agency:  [place]  Status of Service:  Completed, signed off  Medicare Important Message Given:  Yes-second notification given Date Medicare IM Given:    Medicare IM give by:    Date Additional Medicare IM Given:    Additional Medicare Important Message give by:     If discussed at [place] of Stay Meetings, dates discussed:    Additional Comments: Possible DC over weekend, Weekend RN left instructions on how to notify [facility] of pt's discharge.  Sherald Barge, RN 07/06/2015, 11:44 AM

## 2015-07-06 NOTE — Progress Notes (Addendum)
Subjective: Patient denies any difficulty in breathing. Denies any nausea or vomiting  Objective: Vital signs in last 24 hours: Temp:  [97.4 F (36.3 C)-97.8 F (36.6 C)] 97.8 F (36.6 C) (08/12 0531) Pulse Rate:  [60-64] 60 (08/12 0531) Resp:  [20] 20 (08/12 0531) BP: (132-156)/(54-69) 156/69 mmHg (08/12 0531) SpO2:  [90 %-100 %] 90 % (08/12 0531) Weight:  [133 lb 8 oz (60.555 kg)] 133 lb 8 oz (60.555 kg) (08/12 0531)  Intake/Output from previous day: 08/11 0701 - 08/12 0700 In: 3691.3 [P.O.:840; I.V.:2851.3] Out: -  Intake/Output this shift:     Recent Labs  07/03/15 1514 07/04/15 0539 07/05/15 0635 07/06/15 0606  HGB 8.9* 8.7* 8.6* 9.5*    Recent Labs  07/05/15 0635 07/06/15 0606  WBC 4.9 4.7  RBC 3.08* 3.42*  HCT 28.8* 32.0*  PLT 199 191    Recent Labs  07/05/15 0635 07/06/15 0606  NA 142 140  K 4.7 4.8  CL 122* 120*  CO2 13* 14*  BUN 79* 81*  CREATININE 4.86* 5.13*  GLUCOSE 119* 110*  CALCIUM 8.3* 8.1*   No results for input(s): LABPT, INR in the last 72 hours.  Generally : Patient is alert in no apparent distress Chest: Decreased breath sound, no wheezing Heart exam revealed regular rate and rhythm no murmur Extremities: No edema  Assessment/Plan: Problem #1 acute kidney injury superimposed on chronic: Her renal function is improving and she is none oliguric Problem #2 metabolic acidosis: Patient started on IV fluid with sodium bicarbonate. Her CO2 is 16 improving Problem #3 difficulty breathing: Patient with ischemic cardiomyopathy presently she is asymptomatic.She is on lasix and none oliguric Problem #4 anemia; Her hemoglobin is stable Problem #5 chronic renal failure: Stage IV. It was thought to be secondary to diabetes/hypertension/cardiorenal Problem #6 history of dementia Problem #7 hyperkalemia: potassium is normal Plan: We'll continue with present treatment ] we'll check her basic metabolic panel in the  morning.    Alainah Phang S 07/06/2015, 8:18 AM

## 2015-07-06 NOTE — Care Management Important Message (Signed)
Important Message  Patient Details  Name: [name] MRN: [id number] Date of Birth: 1941-01-29   Medicare Important Message Given:  Yes-second notification given    Sherald Barge, RN 07/06/2015, 11:42 AM

## 2015-07-07 LAB — GLUCOSE, CAPILLARY
GLUCOSE-CAPILLARY: 142 mg/dL — AB (ref 65–99)
GLUCOSE-CAPILLARY: 160 mg/dL — AB (ref 65–99)
Glucose-Capillary: 91 mg/dL (ref 65–99)
Glucose-Capillary: 136 mg/dL — ABNORMAL HIGH (ref 65–99)

## 2015-07-07 LAB — BASIC METABOLIC PANEL
Anion gap: 9 (ref 5–15)
BUN: 77 mg/dL — ABNORMAL HIGH (ref 6–20)
CO2: 16 mmol/L — ABNORMAL LOW (ref 22–32)
Calcium: 7.6 mg/dL — ABNORMAL LOW (ref 8.9–10.3)
Chloride: 114 mmol/L — ABNORMAL HIGH (ref 101–111)
Creatinine, Ser: 4.49 mg/dL — ABNORMAL HIGH (ref 0.44–1.00)
GFR calc non Af Amer: 9 mL/min — ABNORMAL LOW (ref >60)
GFR, EST AFRICAN AMERICAN: 10 mL/min — AB (ref >60)
Glucose, Bld: 101 mg/dL — ABNORMAL HIGH (ref 65–99)
Potassium: 4.6 mmol/L (ref 3.5–5.1)
SODIUM: 139 mmol/L (ref 135–145)

## 2015-07-07 LAB — URINE CULTURE

## 2015-07-07 NOTE — Progress Notes (Signed)
[facility] PROGRESS NOTE  [name] [id number] DOB: 03/11/1941 DOA: 07/03/2015 PCP: [name], MD  Assessment/Plan: Acute on chronic combined systolic and diastolic CHF - Most recent ECHO done July 2015 revealing ejection fraction of 45-50% with grade 2 diastolic function  - Very elevated BNP 3700 on admission -Continue Lasix and IV fluids per Nephrology recommendations. -UOP 750 yesterday (starting to get more accurate Is and Os now that foley has been placed). -CXR revealed cardiomegaly with mild vascular congestion, will need to be careful with fluid status.   Acute on chronic CKD stage IV-V -Creatinine improving, 4.49. -Appreciate renal input and recommendations.  Hyperkalemia - Resolved after Kayexalete , suspect worsening due to renal failure   Metabolic acidosis -Secondary to advanced CKD. -Improving, bicarb 16 today. -Receiving sodium bicarb in fluids.   Anemia - Chronic disease anemia related to renal failure - HGB is 9.5, no need to transfuse unless less than 7  Dementia -At baseline, continue Aricept  DM -Well controlled -Continue current management   HTN - Fair control, do not anticipate any medication change in hospital, continue Imdur, metoprolol  GERD -continue PPI  HLD -Continue statin  Seizure disorder - no seizures while in hospital, continue Kepra  Code Status: FULL DVT prophylaxis : Heparin  Family Communication: Discussed with patient who understands and has no concerns at this time. Disposition Plan:  Discharge home in 24-48 hours   Consultants:  Nephrology  Antibiotics:  None  Subjective: Feeling better. Was able to eat without difficulty. No complaints of chest pain, abd pain, cough, n/v/d.  Objective: Filed Vitals:   07/06/15 1542 07/06/15 1953 07/06/15 2130 07/07/15 0601  BP: 132/50  152/57 126/61  Pulse: 62 63 72 65  Temp: 97.6 F (36.4 C)  97.5 F (36.4 C) 97.4 F (36.3 C)  TempSrc: Oral   Oral Axillary  Resp: 20 20 20 20   Height:      Weight:      SpO2: 100% 97% 98% 99%    Intake/Output Summary (Last 24 hours) at 07/07/15 0635 Last data filed at 07/06/15 1900  Gross per 24 hour  Intake    720 ml  Output    750 ml  Net    -30 ml   Filed Weights   07/04/15 0537 07/05/15 0549 07/06/15 0531  Weight: 59.33 kg (130 lb 12.8 oz) 58.06 kg (128 lb) 60.555 kg (133 lb 8 oz)    Exam: General:  NAD, appears calm and comfortable, afebrile. Cardiovascular: RRR, no m/r/g Respiratory: CTAB, no w/r/r Abdomen: soft, positive bowel sounds, no distension Extremities: 1+ BLE edema Neurologic:  Non focal  Data Reviewed: Basic Metabolic Panel:  Recent Labs Lab 07/03/15 1514 07/04/15 0539 07/05/15 0635 07/06/15 0606  NA 139 142 142 140  K 5.4* 5.0 4.7 4.8  CL 121* 120* 122* 120*  CO2 12* 13* 13* 14*  GLUCOSE 144* 105* 119* 110*  BUN 77* 77* 79* 81*  CREATININE 4.47* 4.59* 4.86* 5.13*  CALCIUM 8.7* 8.7* 8.3* 8.1*   Liver Function Tests:  Recent Labs Lab 07/03/15 1514  AST 28  ALT 19  ALKPHOS 102  BILITOT 0.5  PROT 8.9*  ALBUMIN 3.4*   CBC:  Recent Labs Lab 07/03/15 1514 07/04/15 0539 07/05/15 0635 07/06/15 0606  WBC 6.0 4.9 4.9 4.7  NEUTROABS 3.9  --   --   --   HGB 8.9* 8.7* 8.6* 9.5*  HCT 30.4* 29.5* 28.8* 32.0*  MCV 95.3 93.9 93.5 93.6  PLT 205  200 199 191   Cardiac Enzymes:  Recent Labs Lab 07/03/15 1514  TROPONINI 0.03   BNP (last 3 results)  Recent Labs  01/02/15 1125 05/28/15 1148 07/03/15 1514  BNP >4500.0* 1084.0* 3737.0*    ProBNP (last 3 results) No results for input(s): PROBNP in the last 8760 hours.  CBG:  Recent Labs Lab 07/05/15 2022 07/06/15 0813 07/06/15 1158 07/06/15 1638 07/06/15 2216  GLUCAP 126* 99 127* 170* 109*    No results found for this or any previous visit (from the past 240 hour(s)).   Studies: Dg Chest Port 1 View  07/06/2015   CLINICAL DATA:  Increased shortness of breath.  EXAM: PORTABLE  CHEST - 1 VIEW  COMPARISON:  07/03/2015  FINDINGS: There is mild bilateral interstitial thickening. There is no focal parenchymal opacity. There is no pleural effusion or pneumothorax. There is stable cardiomegaly. There is evidence of prior CABG.  The osseous structures are unremarkable.  IMPRESSION: Cardiomegaly with mild pulmonary vascular congestion.   Electronically Signed   By: Kathreen Devoid   On: 07/06/2015 11:03    Scheduled Meds: . antiseptic oral rinse  7 mL Mouth Rinse BID  . calcitRIOL  0.25 mcg Oral Daily  . donepezil  10 mg Oral QHS  . furosemide  100 mg Intravenous BID  . heparin  5,000 Units Subcutaneous 3 times per day  . hydrALAZINE  75 mg Oral 3 times per day  . insulin aspart  0-5 Units Subcutaneous QHS  . insulin aspart  0-9 Units Subcutaneous TID WC  . isosorbide mononitrate  15 mg Oral q morning - 10a  . levETIRAcetam  500 mg Oral BID  . metoprolol succinate  50 mg Oral Daily  . pantoprazole  40 mg Oral q morning - 10a  . pravastatin  20 mg Oral QHS  . sodium chloride  3 mL Intravenous Q12H  . sodium chloride  3 mL Intravenous Q12H   Continuous Infusions: . sodium chloride 0.45 % 1,000 mL with sodium bicarbonate 50 mEq infusion 125 mL/hr at 07/06/15 1106    Principal Problem:   Acute on chronic combined systolic and diastolic congestive heart failure Active Problems:   Seizure disorder   CKD (chronic kidney disease) stage 4, GFR 15-29 ml/min   Dementia   CHF (congestive heart failure)   Severe anemia   AKI (acute kidney injury)   Type I diabetes mellitus, well controlled   Hyperkalemia    Time spent: 25 minutes. Greater than 50% of this time was spent in direct contact with the patient coordinating care.    Domingo Mend, MD [facility] Pager: [phone]  If 7PM-7AM, please contact night-coverage at [email or web address], password [facility] 07/07/2015, 6:35 AM  LOS: 3 days     I, Jessica D. Leonie Green, acting as scribe, recorded this note  contemporaneously in the presence of Dr. Lelon Frohlich, M.D. on 07/07/2015.   I have reviewed the above documentation for accuracy and completeness, and I agree with the above.  Domingo Mend, MD [facility] Pager: [phone]

## 2015-07-08 LAB — BASIC METABOLIC PANEL
Anion gap: 7 (ref 5–15)
BUN: 81 mg/dL — ABNORMAL HIGH (ref 6–20)
CO2: 20 mmol/L — ABNORMAL LOW (ref 22–32)
CREATININE: 4.18 mg/dL — AB (ref 0.44–1.00)
Calcium: 7.5 mg/dL — ABNORMAL LOW (ref 8.9–10.3)
Chloride: 112 mmol/L — ABNORMAL HIGH (ref 101–111)
GFR calc non Af Amer: 10 mL/min — ABNORMAL LOW (ref >60)
GFR, EST AFRICAN AMERICAN: 11 mL/min — AB (ref >60)
GLUCOSE: 114 mg/dL — AB (ref 65–99)
Potassium: 4.4 mmol/L (ref 3.5–5.1)
SODIUM: 139 mmol/L (ref 135–145)

## 2015-07-08 LAB — GLUCOSE, CAPILLARY
GLUCOSE-CAPILLARY: 117 mg/dL — AB (ref 65–99)
GLUCOSE-CAPILLARY: 226 mg/dL — AB (ref 65–99)
Glucose-Capillary: 99 mg/dL (ref 65–99)
Glucose-Capillary: 228 mg/dL — ABNORMAL HIGH (ref 65–99)

## 2015-07-08 LAB — CBC
HEMATOCRIT: 28.6 % — AB (ref 36.0–46.0)
Hemoglobin: 8.9 g/dL — ABNORMAL LOW (ref 12.0–15.0)
MCH: 28.3 pg (ref 26.0–34.0)
MCHC: 31.1 g/dL (ref 30.0–36.0)
MCV: 91.1 fL (ref 78.0–100.0)
PLATELETS: 180 10*3/uL (ref 150–400)
RBC: 3.14 MIL/uL — ABNORMAL LOW (ref 3.87–5.11)
RDW: 20.1 % — ABNORMAL HIGH (ref 11.5–15.5)
WBC: 4.9 10*3/uL (ref 4.0–10.5)

## 2015-07-08 NOTE — Progress Notes (Signed)
[facility] PROGRESS NOTE  Takila Kronberg Morrill PFX:[id number] DOB: Jun 02, 1941 DOA: 07/03/2015 PCP: [name], MD  Assessment/Plan: Acute on chronic combined systolic and diastolic CHF - Most recent ECHO done July 2015 revealing ejection fraction of 45-50% with grade 2 diastolic function  - Very elevated BNP 3700 on admission -Continue Lasix and IV fluids per Nephrology recommendations. -UOP 2175 cc yesterday (starting to get more accurate Is and Os now that foley has been placed). -CXR revealed cardiomegaly with mild vascular congestion, will need to be careful with fluid status.   Acute on chronic CKD stage IV-V -Creatinine improving, 4.18. -Appreciate renal input and recommendations.  Hyperkalemia - Resolved after Kayexalete , suspect worsening due to renal failure   Metabolic acidosis -Secondary to advanced CKD. -Improving, bicarb 20 today. -Receiving sodium bicarb in fluids.   Anemia - Chronic disease anemia related to renal failure - HGB is 8.9, no need to transfuse unless less than 7  Dementia -At baseline, continue Aricept  DM -Well controlled -Continue current management   HTN - Fair control, do not anticipate any medication change in hospital, continue Imdur, metoprolol  GERD -continue PPI  HLD -Continue statin  Seizure disorder - no seizures while in hospital, continue Kepra  Code Status: FULL DVT prophylaxis : Heparin  Family Communication: Discussed with patient who understands and has no concerns at this time. Disposition Plan:  Discharge home in approx. 48 hours as long as renal function continues to improve.   Consultants:  Nephrology  Antibiotics:  None  Subjective: Feeling better. Was able to eat without difficulty. No complaints of chest pain, abd pain, cough, n/v/d.  Objective: Filed Vitals:   07/07/15 0830 07/07/15 1347 07/07/15 2102 07/08/15 0459  BP:  136/49 139/53 134/68  Pulse: 50 62 64 66  Temp:  97.7 F (36.5  C) 98 F (36.7 C) 97.9 F (36.6 C)  TempSrc:  Oral Oral Oral  Resp: 18 20 20 21   Height:      Weight:    61.145 kg (134 lb 12.8 oz)  SpO2:  100% 100% 100%    Intake/Output Summary (Last 24 hours) at 07/08/15 1120 Last data filed at 07/08/15 0800  Gross per 24 hour  Intake    300 ml  Output   2075 ml  Net  -1775 ml   Filed Weights   07/06/15 0531 07/07/15 0601 07/08/15 0459  Weight: 60.555 kg (133 lb 8 oz) 61.78 kg (136 lb 3.2 oz) 61.145 kg (134 lb 12.8 oz)    Exam: General:  NAD, appears calm and comfortable, afebrile. Cardiovascular: RRR, no m/r/g Respiratory: CTAB, no w/r/r Abdomen: soft, positive bowel sounds, no distension Extremities: 1+ BLE edema Neurologic:  Non focal  Data Reviewed: Basic Metabolic Panel:  Recent Labs Lab 07/04/15 0539 07/05/15 0635 07/06/15 0606 07/07/15 0550 07/08/15 0547  NA 142 142 140 139 139  K 5.0 4.7 4.8 4.6 4.4  CL 120* 122* 120* 114* 112*  CO2 13* 13* 14* 16* 20*  GLUCOSE 105* 119* 110* 101* 114*  BUN 77* 79* 81* 77* 81*  CREATININE 4.59* 4.86* 5.13* 4.49* 4.18*  CALCIUM 8.7* 8.3* 8.1* 7.6* 7.5*   Liver Function Tests:  Recent Labs Lab 07/03/15 1514  AST 28  ALT 19  ALKPHOS 102  BILITOT 0.5  PROT 8.9*  ALBUMIN 3.4*   CBC:  Recent Labs Lab 07/03/15 1514 07/04/15 0539 07/05/15 0635 07/06/15 0606 07/08/15 0547  WBC 6.0 4.9 4.9 4.7 4.9  NEUTROABS 3.9  --   --   --   --  HGB 8.9* 8.7* 8.6* 9.5* 8.9*  HCT 30.4* 29.5* 28.8* 32.0* 28.6*  MCV 95.3 93.9 93.5 93.6 91.1  PLT 205 200 199 191 180   Cardiac Enzymes:  Recent Labs Lab 07/03/15 1514  TROPONINI 0.03   BNP (last 3 results)  Recent Labs  01/02/15 1125 05/28/15 1148 07/03/15 1514  BNP >4500.0* 1084.0* 3737.0*    ProBNP (last 3 results) No results for input(s): PROBNP in the last 8760 hours.  CBG:  Recent Labs Lab 07/07/15 0713 07/07/15 1137 07/07/15 1652 07/07/15 2105 07/08/15 0723  GLUCAP 91 142* 136* 160* 99    Recent Results  (from the past 240 hour(s))  Culture, Urine     Status: None   Collection Time: 07/06/15 12:10 PM  Result Value Ref Range Status   Specimen Description URINE, CATHETERIZED  Final   Special Requests NONE  Final   Culture   Final    MULTIPLE SPECIES PRESENT, SUGGEST RECOLLECTION Performed at [facility]    Report Status 07/07/2015 FINAL  Final     Studies: No results found.  Scheduled Meds: . antiseptic oral rinse  7 mL Mouth Rinse BID  . calcitRIOL  0.25 mcg Oral Daily  . donepezil  10 mg Oral QHS  . furosemide  100 mg Intravenous BID  . heparin  5,000 Units Subcutaneous 3 times per day  . hydrALAZINE  75 mg Oral 3 times per day  . insulin aspart  0-5 Units Subcutaneous QHS  . insulin aspart  0-9 Units Subcutaneous TID WC  . isosorbide mononitrate  15 mg Oral q morning - 10a  . levETIRAcetam  500 mg Oral BID  . metoprolol succinate  50 mg Oral Daily  . pantoprazole  40 mg Oral q morning - 10a  . pravastatin  20 mg Oral QHS  . sodium chloride  3 mL Intravenous Q12H  . sodium chloride  3 mL Intravenous Q12H   Continuous Infusions: . sodium chloride 0.45 % 1,000 mL with sodium bicarbonate 50 mEq infusion 125 mL/hr at 07/08/15 7169    Principal Problem:   Acute on chronic combined systolic and diastolic congestive heart failure Active Problems:   Seizure disorder   CKD (chronic kidney disease) stage 4, GFR 15-29 ml/min   Dementia   CHF (congestive heart failure)   Severe anemia   AKI (acute kidney injury)   Type I diabetes mellitus, well controlled   Hyperkalemia    Time spent: 25 minutes. Greater than 50% of this time was spent in direct contact with the patient coordinating care.    Domingo Mend, MD [facility] Pager: [phone]  If 7PM-7AM, please contact night-coverage at [email or web address], password [facility] 07/08/2015, 11:20 AM  LOS: 4 days

## 2015-07-08 NOTE — Progress Notes (Signed)
Subjective: Patient denies any difficulty in breathing. Denies any nausea or vomiting  Objective: Vital signs in last 24 hours: Temp:  [97.7 F (36.5 C)-98 F (36.7 C)] 97.9 F (36.6 C) (08/14 0459) Pulse Rate:  [62-66] 66 (08/14 0459) Resp:  [20-21] 21 (08/14 0459) BP: (134-139)/(49-68) 134/68 mmHg (08/14 0459) SpO2:  [100 %] 100 % (08/14 0459) Weight:  [134 lb 12.8 oz (61.145 kg)] 134 lb 12.8 oz (61.145 kg) (08/14 0459)  Intake/Output from previous day: 08/13 0701 - 08/14 0700 In: 360 [P.O.:240; IV Piggyback:120] Out: 2175 [Urine:2175] Intake/Output this shift: Total I/O In: -  Out: 250 [Urine:250]   Recent Labs  07/06/15 0606 07/08/15 0547  HGB 9.5* 8.9*    Recent Labs  07/06/15 0606 07/08/15 0547  WBC 4.7 4.9  RBC 3.42* 3.14*  HCT 32.0* 28.6*  PLT 191 180    Recent Labs  07/07/15 0550 07/08/15 0547  NA 139 139  K 4.6 4.4  CL 114* 112*  CO2 16* 20*  BUN 77* 81*  CREATININE 4.49* 4.18*  GLUCOSE 101* 114*  CALCIUM 7.6* 7.5*   No results for input(s): LABPT, INR in the last 72 hours.  Generally : Patient is alert. Sitting on a chair Chest: Decreased breath sound,  Heart exam revealed regular rate and rhythm no murmur Extremities: No edema  Assessment/Plan: Problem #1 acute kidney injury superimposed on chronic: Her BUN and creatinine is improving and asymptomatic Problem #2 metabolic acidosis: Patient started on IV fluid with sodium bicarbonate. Her CO2 is 20 improving Problem #3 difficulty breathing: Patient with ischemic cardiomyopathy . She had 2100 cc of urine out put and she is asymptomatic Problem #4 anemia; Her hemoglobin is stable Problem #5 chronic renal failure: Stage IV. It was thought to be secondary to diabetes/hypertension/cardiorenal Problem #6 history of dementia Problem #7 hyperkalemia: potassium is normal Plan: We'll continue with present treatment ] we'll check her basic metabolic panel in the morning.    [name]  S 07/08/2015, 9:51 AM

## 2015-07-09 LAB — BASIC METABOLIC PANEL
Anion gap: 9 (ref 5–15)
BUN: 79 mg/dL — AB (ref 6–20)
CO2: 21 mmol/L — ABNORMAL LOW (ref 22–32)
CREATININE: 3.85 mg/dL — AB (ref 0.44–1.00)
Calcium: 7.4 mg/dL — ABNORMAL LOW (ref 8.9–10.3)
Chloride: 109 mmol/L (ref 101–111)
GFR calc Af Amer: 12 mL/min — ABNORMAL LOW (ref >60)
GFR, EST NON AFRICAN AMERICAN: 11 mL/min — AB (ref >60)
GLUCOSE: 104 mg/dL — AB (ref 65–99)
Potassium: 3.9 mmol/L (ref 3.5–5.1)
SODIUM: 139 mmol/L (ref 135–145)

## 2015-07-09 LAB — GLUCOSE, CAPILLARY
GLUCOSE-CAPILLARY: 111 mg/dL — AB (ref 65–99)
GLUCOSE-CAPILLARY: 185 mg/dL — AB (ref 65–99)
Glucose-Capillary: 155 mg/dL — ABNORMAL HIGH (ref 65–99)
Glucose-Capillary: 138 mg/dL — ABNORMAL HIGH (ref 65–99)

## 2015-07-09 NOTE — Progress Notes (Signed)
Subjective: Patient offers no complaint  Objective: Vital signs in last 24 hours: Temp:  [98 F (36.7 C)-98.6 F (37 C)] 98.6 F (37 C) (08/15 0500) Pulse Rate:  [65-75] 65 (08/15 0500) Resp:  [20] 20 (08/15 0500) BP: (127-136)/(49-61) 127/49 mmHg (08/15 0500) SpO2:  [97 %-100 %] 100 % (08/15 0500) Weight:  [139 lb 12.8 oz (63.413 kg)] 139 lb 12.8 oz (63.413 kg) (08/15 0500)  Intake/Output from previous day: 08/14 0701 - 08/15 0700 In: 720 [P.O.:720] Out: 2325 [Urine:2325] Intake/Output this shift:     Recent Labs  07/08/15 0547  HGB 8.9*    Recent Labs  07/08/15 0547  WBC 4.9  RBC 3.14*  HCT 28.6*  PLT 180    Recent Labs  07/08/15 0547 07/09/15 0535  NA 139 139  K 4.4 3.9  CL 112* 109  CO2 20* 21*  BUN 81* 79*  CREATININE 4.18* 3.85*  GLUCOSE 114* 104*  CALCIUM 7.5* 7.4*   No results for input(s): LABPT, INR in the last 72 hours.  Generally : Patient is alert. Sitting on a chair Chest: Decreased breath sound,  Heart exam revealed regular rate and rhythm no murmur Extremities: No edema  Assessment/Plan: Problem #1 acute kidney injury superimposed on chronic: Patient had 2200 cc of urine out put and her renal function is improving Problem #2 metabolic acidosis: Patient on sodium bicarbonate supplement and her metabolic acidosis is improving Problem #3 difficulty breathing: Patient with ischemic cardiomyopathy . She denies any difficult in breathing Problem #4 anemia; Her hemoglobin is stable Problem #5 chronic renal failure: Stage IV. It was thought to be secondary to diabetes/hypertension/cardiorenal Problem #6 history of dementia Problem #7 hyperkalemia: potassium is normal Plan: We'll continue with present treatment ] we'll check her basic metabolic panel in the morning.    Nico Rogness S 07/09/2015, 7:58 AM

## 2015-07-09 NOTE — Progress Notes (Signed)
[facility] PROGRESS NOTE  [name] XKG:[id number] DOB: [birth year] DOA: 07/03/2015 PCP: [name], MD  Assessment/Plan: Acute on chronic combined systolic and diastolic CHF - Most recent ECHO done July 2015 revealing ejection fraction of 45-50% with grade 2 diastolic function  - Very elevated BNP 3700 on admission -Continue Lasix and IV fluids per Nephrology recommendations. -UOP 2325 cc yesterday.  Acute on chronic CKD stage IV-V -Creatinine improving, 3.85. -Appreciate renal input and recommendations.  Hyperkalemia - Resolved after Kayexalete , suspect worsening due to renal failure   Metabolic acidosis -Secondary to advanced CKD. -Improving, bicarb 21 today. -Continue receiving sodium bicarb in fluids.   Anemia - Chronic disease anemia related to renal failure - HGB is 8.9, no need to transfuse unless less than 7  Dementia -At baseline, continue Aricept  DM -Well controlled -Continue current management   HTN - Fair control, do not anticipate any medication change in hospital, continue Imdur, metoprolol  GERD -continue PPI  HLD -Continue statin  Seizure disorder - no seizures while in hospital, continue Kepra  Code Status: FULL DVT prophylaxis : Heparin  Family Communication: Discussed with patient who understands and has no concerns at this time. Disposition Plan:  Discharge home in approx. 24 hours as long as renal function continues to improve.   Consultants:  Nephrology  Antibiotics:  None  Subjective: Feeling better today. No reports of chest pain, shortness of breath, n/v/d, or abd pain.  Objective: Filed Vitals:   07/07/15 2102 07/08/15 0459 07/08/15 2128 07/09/15 0500  BP: 139/53 134/68 136/61 127/49  Pulse: 64 66 75 65  Temp: 98 F (36.7 C) 97.9 F (36.6 C) 98 F (36.7 C) 98.6 F (37 C)  TempSrc: Oral Oral Oral Oral  Resp: 20 21 20 20   Height:      Weight:  61.145 kg (134 lb 12.8 oz)  63.413 kg (139 lb 12.8  oz)  SpO2: 100% 100% 97% 100%    Intake/Output Summary (Last 24 hours) at 07/09/15 1026 Last data filed at 07/09/15 0842  Gross per 24 hour  Intake 3395.84 ml  Output   2075 ml  Net 1320.84 ml   Filed Weights   07/07/15 0601 07/08/15 0459 07/09/15 0500  Weight: 61.78 kg (136 lb 3.2 oz) 61.145 kg (134 lb 12.8 oz) 63.413 kg (139 lb 12.8 oz)    Exam: General:  NAD, appears calm and comfortable, laying in bed, afebrile. Cardiovascular: RRR, no m/r/g Respiratory: CTAB, no w/r/r Abdomen: soft, positive bowel sounds, no distension Extremities:No BLE edema, improved from yesterday Neurologic:  Non focal  Data Reviewed: Basic Metabolic Panel:  Recent Labs Lab 07/05/15 0635 07/06/15 0606 07/07/15 0550 07/08/15 0547 07/09/15 0535  NA 142 140 139 139 139  K 4.7 4.8 4.6 4.4 3.9  CL 122* 120* 114* 112* 109  CO2 13* 14* 16* 20* 21*  GLUCOSE 119* 110* 101* 114* 104*  BUN 79* 81* 77* 81* 79*  CREATININE 4.86* 5.13* 4.49* 4.18* 3.85*  CALCIUM 8.3* 8.1* 7.6* 7.5* 7.4*   Liver Function Tests:  Recent Labs Lab 07/03/15 1514  AST 28  ALT 19  ALKPHOS 102  BILITOT 0.5  PROT 8.9*  ALBUMIN 3.4*   CBC:  Recent Labs Lab 07/03/15 1514 07/04/15 0539 07/05/15 0635 07/06/15 0606 07/08/15 0547  WBC 6.0 4.9 4.9 4.7 4.9  NEUTROABS 3.9  --   --   --   --   HGB 8.9* 8.7* 8.6* 9.5* 8.9*  HCT 30.4* 29.5* 28.8* 32.0*  28.6*  MCV 95.3 93.9 93.5 93.6 91.1  PLT 205 200 199 191 180   Cardiac Enzymes:  Recent Labs Lab 07/03/15 1514  TROPONINI 0.03   BNP (last 3 results)  Recent Labs  01/02/15 1125 05/28/15 1148 07/03/15 1514  BNP >4500.0* 1084.0* 3737.0*    ProBNP (last 3 results) No results for input(s): PROBNP in the last 8760 hours.  CBG:  Recent Labs Lab 07/08/15 0723 07/08/15 1128 07/08/15 1623 07/08/15 2126 07/09/15 0720  GLUCAP 99 117* 226* 228* 111*    Recent Results (from the past 240 hour(s))  Culture, Urine     Status: None   Collection Time:  07/06/15 12:10 PM  Result Value Ref Range Status   Specimen Description URINE, CATHETERIZED  Final   Special Requests NONE  Final   Culture   Final    MULTIPLE SPECIES PRESENT, SUGGEST RECOLLECTION Performed at [facility]    Report Status 07/07/2015 FINAL  Final     Studies: No results found.  Scheduled Meds: . antiseptic oral rinse  7 mL Mouth Rinse BID  . calcitRIOL  0.25 mcg Oral Daily  . donepezil  10 mg Oral QHS  . furosemide  100 mg Intravenous BID  . heparin  5,000 Units Subcutaneous 3 times per day  . hydrALAZINE  75 mg Oral 3 times per day  . insulin aspart  0-5 Units Subcutaneous QHS  . insulin aspart  0-9 Units Subcutaneous TID WC  . isosorbide mononitrate  15 mg Oral q morning - 10a  . levETIRAcetam  500 mg Oral BID  . metoprolol succinate  50 mg Oral Daily  . pantoprazole  40 mg Oral q morning - 10a  . pravastatin  20 mg Oral QHS  . sodium chloride  3 mL Intravenous Q12H  . sodium chloride  3 mL Intravenous Q12H   Continuous Infusions: . sodium chloride 0.45 % 1,000 mL with sodium bicarbonate 50 mEq infusion 125 mL/hr at 07/08/15 2351    Principal Problem:   Acute on chronic combined systolic and diastolic congestive heart failure Active Problems:   Seizure disorder   CKD (chronic kidney disease) stage 4, GFR 15-29 ml/min   Dementia   CHF (congestive heart failure)   Severe anemia   AKI (acute kidney injury)   Type I diabetes mellitus, well controlled   Hyperkalemia    Time spent: 25 minutes. Greater than 50% of this time was spent in direct contact with the patient coordinating care.    Domingo Mend, MD [facility] Pager: [phone]  If 7PM-7AM, please contact night-coverage at [email or web address], password [facility] 07/09/2015, 10:26 AM  LOS: 5 days     I have reviewed the above documentation for accuracy and completeness, and I agree with the above.  Domingo Mend, MD [facility] Pager: [phone]

## 2015-07-09 NOTE — Progress Notes (Signed)
Inpatient Diabetes Program Recommendations  AACE/ADA: New Consensus Statement on Inpatient Glycemic Control (2013)  Target Ranges:  Prepandial:   less than 140 mg/dL      Peak postprandial:   less than 180 mg/dL (1-2 hours)      Critically ill patients:  140 - 180 mg/dL   Results for [name], [name] (MRN [id number]) as of 07/09/2015 10:20  Ref. Range 07/08/2015 07:23 07/08/2015 11:28 07/08/2015 16:23 07/08/2015 21:26 07/09/2015 07:20  Glucose-Capillary Latest Ref Range: 65-99 mg/dL 99 117 (H) 226 (H) 228 (H) 111 (H)   Reason for Admission: Acute on Chronic HF  Diabetes history: DM 2 Outpatient Diabetes medications: Levemir 4 units QHS, Humalog 2 units TID meal coverage, Tradjenta 5 mg Daily Current orders for Inpatient glycemic control: Novolog Sensitive + HS scale  Results for [name], [name] (MRN [id number]) as of 07/09/2015 10:20  Ref. Range 07/04/2015 05:39  Hemoglobin A1C Latest Ref Range: 4.8-5.6 % 5.4   Inpatient Diabetes Program Recommendations Insulin - Meal Coverage: Glucose went up significantly around dinner and bedtime. Patient takes 2 units of short acting at home for Meal coverage. Please consider starting home dose of meal coverage, Novolog 2 units TID with meals.  Thanks,  Tama Headings RN, MSN, [facility] Inpatient Diabetes Coordinator Team Pager [phone]

## 2015-07-10 ENCOUNTER — Other Ambulatory Visit (HOSPITAL_COMMUNITY): Payer: Medicare Other

## 2015-07-10 ENCOUNTER — Encounter (HOSPITAL_COMMUNITY): Payer: Medicare Other

## 2015-07-10 LAB — GLUCOSE, CAPILLARY
GLUCOSE-CAPILLARY: 125 mg/dL — AB (ref 65–99)
GLUCOSE-CAPILLARY: 166 mg/dL — AB (ref 65–99)
GLUCOSE-CAPILLARY: 176 mg/dL — AB (ref 65–99)
GLUCOSE-CAPILLARY: 197 mg/dL — AB (ref 65–99)

## 2015-07-10 LAB — BASIC METABOLIC PANEL
Anion gap: 8 (ref 5–15)
BUN: 78 mg/dL — AB (ref 6–20)
CHLORIDE: 107 mmol/L (ref 101–111)
CO2: 24 mmol/L (ref 22–32)
CREATININE: 3.61 mg/dL — AB (ref 0.44–1.00)
Calcium: 7.2 mg/dL — ABNORMAL LOW (ref 8.9–10.3)
GFR calc Af Amer: 13 mL/min — ABNORMAL LOW (ref >60)
GFR calc non Af Amer: 11 mL/min — ABNORMAL LOW (ref >60)
Glucose, Bld: 124 mg/dL — ABNORMAL HIGH (ref 65–99)
POTASSIUM: 4.2 mmol/L (ref 3.5–5.1)
SODIUM: 139 mmol/L (ref 135–145)

## 2015-07-10 NOTE — Progress Notes (Signed)
REVIEWED.  

## 2015-07-10 NOTE — Progress Notes (Signed)
[name]  MRN: [id number]  DOB/AGE: 1941-01-21 74 y.o.  Primary Care Physician:HILL,GERALD K, MD  Admit date: 07/03/2015  Chief Complaint:  Chief Complaint  Patient presents with  . Shortness of Breath    S-Pt presented on  07/03/2015 with  Chief Complaint  Patient presents with  . Shortness of Breath  .    Pt offers no complaints. Pt sitting  comfortably.NO c/o chest pain    Meds . antiseptic oral rinse  7 mL Mouth Rinse BID  . calcitRIOL  0.25 mcg Oral Daily  . donepezil  10 mg Oral QHS  . furosemide  100 mg Intravenous BID  . heparin  5,000 Units Subcutaneous 3 times per day  . hydrALAZINE  75 mg Oral 3 times per day  . insulin aspart  0-5 Units Subcutaneous QHS  . insulin aspart  0-9 Units Subcutaneous TID WC  . isosorbide mononitrate  15 mg Oral q morning - 10a  . levETIRAcetam  500 mg Oral BID  . metoprolol succinate  50 mg Oral Daily  . pantoprazole  40 mg Oral q morning - 10a  . pravastatin  20 mg Oral QHS  . sodium chloride  3 mL Intravenous Q12H  . sodium chloride  3 mL Intravenous Q12H      Physical Exam: Vital signs in last 24 hours: Temp:  [97.8 F (36.6 C)-98.4 F (36.9 C)] 97.8 F (36.6 C) (08/16 0443) Pulse Rate:  [60-69] 60 (08/16 0443) Resp:  [20] 20 (08/16 0443) BP: (128-145)/(47-57) 128/47 mmHg (08/16 0443) SpO2:  [99 %-100 %] 99 % (08/16 0735) Weight:  [144 lb 6.4 oz (65.499 kg)] 144 lb 6.4 oz (65.499 kg) (08/16 0443) Weight change: 4 lb 9.6 oz (2.087 kg) Last BM Date: 07/09/15  Intake/Output from previous day: 08/15 0701 - 08/16 0700 In: 3886.7 [P.O.:600; I.V.:3166.7; IV Piggyback:120] Out: 2600 [Urine:2600]     Physical Exam: General- pt is awake,sitting comfortably in recliner Resp- No acute REsp distress, Decreased bs at bases. CVS- S1S2 regular in rate and rhythm GIT- BS+, soft, NT, ND EXT- No LE Edema, No Cyanosis   Lab Results: CBC  Recent Labs  07/08/15 0547  WBC 4.9  HGB 8.9*  HCT 28.6*  PLT 180     BMET  Recent Labs  07/09/15 0535 07/10/15 0529  NA 139 139  K 3.9 4.2  CL 109 107  CO2 21* 24  GLUCOSE 104* 124*  BUN 79* 78*  CREATININE 3.85* 3.61*  CALCIUM 7.4* 7.2*    Creat trend  2016 4.47=>4.59=>3.85=>3.61  7.16=>3.0( July admission)  4.93=>3.17( June admission)  4.29=>3.64=>4.10=>3.95 ( Jan/Feb admission) 2015 2.0--3.75( Multiple admission) 2014 1.4--2.5 20131.5--1.7 20121.26   MICRO Recent Results (from the past 240 hour(s))  Culture, Urine     Status: None   Collection Time: 07/06/15 12:10 PM  Result Value Ref Range Status   Specimen Description URINE, CATHETERIZED  Final   Special Requests NONE  Final   Culture   Final    MULTIPLE SPECIES PRESENT, SUGGEST RECOLLECTION Performed at [facility]    Report Status 07/07/2015 FINAL  Final      Lab Results  Component Value Date   PTH 268.4* 04/28/2014   CALCIUM 7.2* 07/10/2015   PHOS 4.4 05/30/2015         Impression: 1)Renal AKI sec to ATN/Cardiorenal  AKI on CKD                AKi improving  CKD stage 4 .  Creat at baseline  CKD since 2013  CKD secondary to DM/HTN/Age associated decline  Progression of CKD marked with multiple AKI in 2015 and now in 2016   2)HTN  Medication- On Diuretics. On Beta blockers On Vasodilators  3)Anemia HGb at goal (9--11) On ESA  4)CKD Mineral-Bone Disorder PTH elevated. Secondary Hyperparathyroidism present. Phosphorus at goal.    5)Electrolytes Hyperkalemic  Now better Normonatremic   6)Acid base Co2 at goal  7) CHF- admitted with CHF exacerbation  Now better  NO LE edema, NO Pul congestion  CXr does no show much fluid          Plan:  Will continue current care     [place] S 07/10/2015, 9:53 AM

## 2015-07-10 NOTE — Care Management Note (Signed)
Case Management Note  Patient Details  Name: [name] MRN: [id number] Date of Birth: 11/30/1940  Expected Discharge Date:  07/11/2015               Expected Discharge Plan:  [place]  In-House Referral:  NA  Discharge planning Services  CM Consult  Post Acute Care Choice:  Resumption of Svcs/PTA Provider Choice offered to:  Adult Children  DME Arranged:    DME Agency:     HH Arranged:  RN [place] Agency:  [facility]  Status of Service:  Completed, signed off  Medicare Important Message Given:  Yes-second notification given Date Medicare IM Given:    Medicare IM give by:    Date Additional Medicare IM Given:    Additional Medicare Important Message give by:     If discussed at [place] of Stay Meetings, dates discussed:  07/10/2015  Additional Comments: Pt continues to diurese.  Sherald Barge, RN 07/10/2015, 11:28 AM

## 2015-07-10 NOTE — Progress Notes (Addendum)
[facility] PROGRESS NOTE  [name]:[id number] DOB: August 02, 1941 DOA: 07/03/2015 PCP: [name], MD  Brief HPI: Patient is a 74 y/o woman with h/o CKD, HTN, combined CHF who was admitted on 07/03/15 with c/o SOB. Was found to be in acute CHF and worsening renal function. Dr. Lowanda Foster has been on board with her care and he has been administering lasix as well as IVF with bicarb. Has had good UOP (Is and Os are not accurate as foley was not placed until 2 days ago and she had been incontinent prior). Continue to follow renal function daily to determine when appropriate for DC.  Assessment/Plan: Acute on chronic combined systolic and diastolic CHF - Most recent ECHO done July 2015 revealing ejection fraction of 45-50% with grade 2 diastolic function  - Very elevated BNP 3700 on admission -Continue Lasix and IV fluids per Nephrology recommendations. -UOP 2600 cc yesterday.  Acute on chronic CKD stage IV-V -Creatinine improving, 3.61. -Appreciate renal input and recommendations.  Hyperkalemia - Resolved after Kayexalete , suspect worsening due to renal failure   Metabolic acidosis -Secondary to advanced CKD. -Improving, bicarb 24 today. -Continue receiving sodium bicarb in fluids.   Anemia - Chronic disease anemia related to renal failure - HGB is 8.9, no need to transfuse unless less than 7  Dementia -At baseline, continue Aricept  DM -Well controlled -Continue current management   HTN - Fair control, do not anticipate any medication change in hospital, continue Imdur, metoprolol  GERD -continue PPI  HLD -Continue statin  Seizure disorder - no seizures while in hospital, continue Kepra  Code Status: FULL DVT prophylaxis : Heparin  Family Communication: Discussed with patient who understands and has no concerns at this time. Disposition Plan:  Discharge home in approx. 24-48 hours as long as renal function continues to  improve.   Consultants:  Nephrology  Antibiotics:  None  Subjective: Feeling better today. No reports of chest pain, shortness of breath, n/v/d, or abd pain.  Objective: Filed Vitals:   07/09/15 1603 07/09/15 2006 07/10/15 0443 07/10/15 0735  BP: 131/57 145/49 128/47   Pulse: 65 69 60   Temp: 98.4 F (36.9 C) 97.9 F (36.6 C) 97.8 F (36.6 C)   TempSrc: Oral Oral Oral   Resp: 20 20 20    Height:      Weight:   65.499 kg (144 lb 6.4 oz)   SpO2: 100% 100% 100% 99%    Intake/Output Summary (Last 24 hours) at 07/10/15 0936 Last data filed at 07/10/15 0634  Gross per 24 hour  Intake 3586.66 ml  Output   2600 ml  Net 986.66 ml   Filed Weights   07/08/15 0459 07/09/15 0500 07/10/15 0443  Weight: 61.145 kg (134 lb 12.8 oz) 63.413 kg (139 lb 12.8 oz) 65.499 kg (144 lb 6.4 oz)    Exam: General:  NAD, appears calm and comfortable, laying in bed, afebrile. Cardiovascular: RRR, no m/r/g Respiratory: CTAB, no w/r/r Abdomen: soft, positive bowel sounds, no distension Extremities:No BLE edema, improved from yesterday Neurologic:  Non focal  Data Reviewed: Basic Metabolic Panel:  Recent Labs Lab 07/06/15 0606 07/07/15 0550 07/08/15 0547 07/09/15 0535 07/10/15 0529  NA 140 139 139 139 139  K 4.8 4.6 4.4 3.9 4.2  CL 120* 114* 112* 109 107  CO2 14* 16* 20* 21* 24  GLUCOSE 110* 101* 114* 104* 124*  BUN 81* 77* 81* 79* 78*  CREATININE 5.13* 4.49* 4.18* 3.85* 3.61*  CALCIUM 8.1* 7.6*  7.5* 7.4* 7.2*   Liver Function Tests:  Recent Labs Lab 07/03/15 1514  AST 28  ALT 19  ALKPHOS 102  BILITOT 0.5  PROT 8.9*  ALBUMIN 3.4*   CBC:  Recent Labs Lab 07/03/15 1514 07/04/15 0539 07/05/15 0635 07/06/15 0606 07/08/15 0547  WBC 6.0 4.9 4.9 4.7 4.9  NEUTROABS 3.9  --   --   --   --   HGB 8.9* 8.7* 8.6* 9.5* 8.9*  HCT 30.4* 29.5* 28.8* 32.0* 28.6*  MCV 95.3 93.9 93.5 93.6 91.1  PLT 205 200 199 191 180   Cardiac Enzymes:  Recent Labs Lab 07/03/15 1514   TROPONINI 0.03   BNP (last 3 results)  Recent Labs  01/02/15 1125 05/28/15 1148 07/03/15 1514  BNP >4500.0* 1084.0* 3737.0*    ProBNP (last 3 results) No results for input(s): PROBNP in the last 8760 hours.  CBG:  Recent Labs Lab 07/09/15 0720 07/09/15 1121 07/09/15 1634 07/09/15 2010 07/10/15 0727  GLUCAP 111* 155* 138* 185* 125*    Recent Results (from the past 240 hour(s))  Culture, Urine     Status: None   Collection Time: 07/06/15 12:10 PM  Result Value Ref Range Status   Specimen Description URINE, CATHETERIZED  Final   Special Requests NONE  Final   Culture   Final    MULTIPLE SPECIES PRESENT, SUGGEST RECOLLECTION Performed at [facility]    Report Status 07/07/2015 FINAL  Final     Studies: No results found.  Scheduled Meds: . antiseptic oral rinse  7 mL Mouth Rinse BID  . calcitRIOL  0.25 mcg Oral Daily  . donepezil  10 mg Oral QHS  . furosemide  100 mg Intravenous BID  . heparin  5,000 Units Subcutaneous 3 times per day  . hydrALAZINE  75 mg Oral 3 times per day  . insulin aspart  0-5 Units Subcutaneous QHS  . insulin aspart  0-9 Units Subcutaneous TID WC  . isosorbide mononitrate  15 mg Oral q morning - 10a  . levETIRAcetam  500 mg Oral BID  . metoprolol succinate  50 mg Oral Daily  . pantoprazole  40 mg Oral q morning - 10a  . pravastatin  20 mg Oral QHS  . sodium chloride  3 mL Intravenous Q12H  . sodium chloride  3 mL Intravenous Q12H   Continuous Infusions: . sodium chloride 0.45 % 1,000 mL with sodium bicarbonate 50 mEq infusion 125 mL/hr at 07/10/15 5638    Principal Problem:   Acute on chronic combined systolic and diastolic congestive heart failure Active Problems:   Seizure disorder   CKD (chronic kidney disease) stage 4, GFR 15-29 ml/min   Dementia   CHF (congestive heart failure)   Severe anemia   AKI (acute kidney injury)   Type I diabetes mellitus, well controlled   Hyperkalemia    Time spent: 25 minutes.  Greater than 50% of this time was spent in direct contact with the patient coordinating care.    Domingo Mend, MD [facility] Pager: [phone]  If 7PM-7AM, please contact night-coverage at [email or web address], password [email or web address] 07/10/2015, 9:36 AM  LOS: 6 days

## 2015-07-10 NOTE — Progress Notes (Signed)
JUL 2016 HB 8.6 MCV 95 FERRITIN 528

## 2015-07-11 DIAGNOSIS — F0390 Unspecified dementia without behavioral disturbance: Secondary | ICD-10-CM

## 2015-07-11 DIAGNOSIS — D631 Anemia in chronic kidney disease: Secondary | ICD-10-CM

## 2015-07-11 DIAGNOSIS — N179 Acute kidney failure, unspecified: Secondary | ICD-10-CM

## 2015-07-11 DIAGNOSIS — I5043 Acute on chronic combined systolic (congestive) and diastolic (congestive) heart failure: Secondary | ICD-10-CM

## 2015-07-11 DIAGNOSIS — N189 Chronic kidney disease, unspecified: Secondary | ICD-10-CM

## 2015-07-11 LAB — BASIC METABOLIC PANEL
ANION GAP: 7 (ref 5–15)
BUN: 72 mg/dL — ABNORMAL HIGH (ref 6–20)
CALCIUM: 7.4 mg/dL — AB (ref 8.9–10.3)
CO2: 26 mmol/L (ref 22–32)
CREATININE: 3.32 mg/dL — AB (ref 0.44–1.00)
Chloride: 104 mmol/L (ref 101–111)
GFR, EST AFRICAN AMERICAN: 15 mL/min — AB (ref >60)
GFR, EST NON AFRICAN AMERICAN: 13 mL/min — AB (ref >60)
GLUCOSE: 165 mg/dL — AB (ref 65–99)
Potassium: 4 mmol/L (ref 3.5–5.1)
Sodium: 137 mmol/L (ref 135–145)

## 2015-07-11 LAB — CBC
HCT: 28.4 % — ABNORMAL LOW (ref 36.0–46.0)
Hemoglobin: 8.8 g/dL — ABNORMAL LOW (ref 12.0–15.0)
MCH: 28 pg (ref 26.0–34.0)
MCHC: 31 g/dL (ref 30.0–36.0)
MCV: 90.4 fL (ref 78.0–100.0)
PLATELETS: 182 10*3/uL (ref 150–400)
RBC: 3.14 MIL/uL — ABNORMAL LOW (ref 3.87–5.11)
RDW: 19.5 % — AB (ref 11.5–15.5)
WBC: 4.7 10*3/uL (ref 4.0–10.5)

## 2015-07-11 LAB — GLUCOSE, CAPILLARY
GLUCOSE-CAPILLARY: 151 mg/dL — AB (ref 65–99)
Glucose-Capillary: 142 mg/dL — ABNORMAL HIGH (ref 65–99)
Glucose-Capillary: 140 mg/dL — ABNORMAL HIGH (ref 65–99)
Glucose-Capillary: 191 mg/dL — ABNORMAL HIGH (ref 65–99)

## 2015-07-11 MED ORDER — FUROSEMIDE 10 MG/ML IJ SOLN
60.0000 mg | Freq: Two times a day (BID) | INTRAMUSCULAR | Status: DC
  Administered 2015-07-11 – 2015-07-12 (×3): 60 mg via INTRAVENOUS
  Filled 2015-07-11 (×3): qty 6

## 2015-07-11 NOTE — Progress Notes (Signed)
PROGRESS NOTE  [name] LTJ:[id number] DOB: 07/18/1941 DOA: 07/03/2015 PCP: [name], MD  Arnoldo Lenis, MD  Summary: 72 yof with a hx of CKD admitted 8/9 with SOB; admitted for acute CHF, AKI superimposed on CKD and seen by nephrology--treated with Lasix, IVF and bicarb.  Assessment/Plan: 1. 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 Continues to improve, appears to be compensated. Echo with WMA. No ACE-I due to poor renal function.  2. AKI superimposed on chronic CKD, stage IV-V. Improving daily. I/O not accurate secondary to incontinence.  3. Hyperkalemia, resolved with Kayexalete.  4. Metabolic acidosis secondary to AKI/CKD. Resolved.  5. Anemia of CKD. Followed by GI and hematology as an outpatient. Currently on aranesp 6. Pyuria with unrevealing culture. 7. Chronic hypoxic respiratory failure. 8. Dementia. Appears to be at baseline 9. DM. Stable, well controlled 10. HTN 11. Seizure disorder. No seizures while in hospital. Continue Kepra   Overall continues to improve  Renal function near baseline, continue management with per nephrologhy including lasix and fluids  Heart failure appears stable  Anticipate discharge 8/18   Code Status: FULL DVT prophylaxis: Heparin  Family Communication: pt alone. Care plan was discussed with pt and no questions at this time  Disposition Plan: Discharge tomorrow   [name], MD  [facility]  Pager [phone] If 7PM-7AM, please contact night-coverage at [email or web address], password [facility] 07/11/2015, 7:09 AM  LOS: 7 days   Consultants:  Nephrology  Procedures:  Echo Study Conclusions  - Left ventricle: The cavity size was normal. Wall thickness was increased in a pattern of moderate LVH. Systolic function was normal. The estimated ejection fraction was in the range of 55% to 60%. Features are consistent with a  pseudonormal left ventricular filling pattern, with concomitant abnormal relaxation and increased filling pressure (grade 2 diastolic dysfunction). Doppler parameters are consistent with high ventricular filling pressure. - Regional wall motion abnormality: Akinesis and scarring of the basal inferior and basal-mid inferolateral myocardium; severe hypokinesis and scarring of the mid anterolateral myocardium; severe hypokinesis of the mid inferior myocardium. - Aortic valve: Mildly calcified annulus. Mildly thickened leaflets. - Mitral valve: There is mildly restricted mobility of the posterior leaflet due to scarring of the basal inferolateral wall. Moderately thickened anterior and mildly thickened posterior leaflets. Severe eccentric regurgitation. Eccentricity may lead to an underestimation of true severity. Vena contracta 0.4 cm. - Left atrium: The atrium was moderately dilated. - Right ventricle: The cavity size was mildly to moderately dilated. Systolic function was moderately to severely reduced. - Right atrium: The atrium was moderately dilated. - Atrial septum: No defect or patent foramen ovale was identified. - Tricuspid valve: Mildly thickened leaflets. There was mild-moderate regurgitation. - Pulmonic valve: Mildly thickened leaflets. There was mild regurgitation. - Pulmonary arteries: Systolic pressure was moderately increased. PA peak pressure: 52 mm Hg (S). - Inferior vena cava: The vessel was dilated. The respirophasic diameter changes were blunted (< 50%), consistent with elevated central venous pressure.  Antibiotics:  None  HPI/Subjective: Feels pretty good, slept okay. Eating well. Denies n/v. No reports of pain or SOB.   Objective: Filed Vitals:   07/10/15 0735 07/10/15 1442 07/10/15 2122 07/11/15 0500  BP:  131/51 165/51 147/85  Pulse:  64 71 61  Temp:  98.1 F (36.7 C) 98.5 F (36.9 C) 97.9 F (36.6 C)    TempSrc:  Oral Oral Oral  Resp:  19 18 20   Height:  Weight:    67.495 kg (148 lb 12.8 oz)  SpO2: 99% 100% 100% 100%    Intake/Output Summary (Last 24 hours) at 07/11/15 0709 Last data filed at 07/11/15 0606  Gross per 24 hour  Intake 1820.83 ml  Output   3325 ml  Net -1504.17 ml     Filed Weights   07/09/15 0500 07/10/15 0443 07/11/15 0500  Weight: 63.413 kg (139 lb 12.8 oz) 65.499 kg (144 lb 6.4 oz) 67.495 kg (148 lb 12.8 oz)    Exam:     Afebrile, VSS General:  Appears calm and comfortable Cardiovascular: RRR, no m/r/g. No LE edema. Respiratory: CTA bilaterally, no w/r/r. Normal respiratory effort. Abdomen: soft, ntnd Musculoskeletal: grossly normal tone BUE/BLE Psychiatric: grossly normal mood and affect, speech fluent and appropriate  New data reviewed:  UOP 3325, BM x 1  BUN and creatinine 72/3.32  HGB stable 8.8.  EKG independently reviewed, sinus bradycardia, NSST changes   Pertinent data since admission:    Pending data:    Scheduled Meds: . antiseptic oral rinse  7 mL Mouth Rinse BID  . calcitRIOL  0.25 mcg Oral Daily  . donepezil  10 mg Oral QHS  . furosemide  100 mg Intravenous BID  . heparin  5,000 Units Subcutaneous 3 times per day  . hydrALAZINE  75 mg Oral 3 times per day  . insulin aspart  0-5 Units Subcutaneous QHS  . insulin aspart  0-9 Units Subcutaneous TID WC  . isosorbide mononitrate  15 mg Oral q morning - 10a  . levETIRAcetam  500 mg Oral BID  . metoprolol succinate  50 mg Oral Daily  . pantoprazole  40 mg Oral q morning - 10a  . pravastatin  20 mg Oral QHS  . sodium chloride  3 mL Intravenous Q12H  . sodium chloride  3 mL Intravenous Q12H   Continuous Infusions: . sodium chloride 0.45 % 1,000 mL with sodium bicarbonate 50 mEq infusion 125 mL/hr at 07/10/15 1820    Principal Problem:   Acute on chronic combined systolic and diastolic congestive heart failure Active Problems:   Seizure disorder   CKD (chronic  kidney disease) stage 4, GFR 15-29 ml/min   Dementia   AKI (acute kidney injury)   Type I diabetes mellitus, well controlled   Anemia in chronic kidney disease (CKD)   I, Arielle Khosrowpour, acting a scribe, recorded this note contemporaneously in the presence of Dr. Melene Plan. Sarajane Jews, M.D. on 07/11/2015   I have reviewed the above documentation for accuracy and completeness, and I agree with the above. [name], MD

## 2015-07-11 NOTE — Progress Notes (Signed)
[name]  MRN: [id number]  DOB/AGE: 06/03/1941 74 y.o.  Primary Care Physician:HILL,GERALD K, MD  Admit date: 07/03/2015  Chief Complaint:  Chief Complaint  Patient presents with  . Shortness of Breath    S-Pt presented on  07/03/2015 with  Chief Complaint  Patient presents with  . Shortness of Breath  .    Pt offers no complaints. Pt sitting  comfortably.NO c/o chest pain    Meds . antiseptic oral rinse  7 mL Mouth Rinse BID  . calcitRIOL  0.25 mcg Oral Daily  . donepezil  10 mg Oral QHS  . furosemide  100 mg Intravenous BID  . heparin  5,000 Units Subcutaneous 3 times per day  . hydrALAZINE  75 mg Oral 3 times per day  . insulin aspart  0-5 Units Subcutaneous QHS  . insulin aspart  0-9 Units Subcutaneous TID WC  . isosorbide mononitrate  15 mg Oral q morning - 10a  . levETIRAcetam  500 mg Oral BID  . metoprolol succinate  50 mg Oral Daily  . pantoprazole  40 mg Oral q morning - 10a  . pravastatin  20 mg Oral QHS  . sodium chloride  3 mL Intravenous Q12H  . sodium chloride  3 mL Intravenous Q12H      Physical Exam: Vital signs in last 24 hours: Temp:  [97.9 F (36.6 C)-98.5 F (36.9 C)] 97.9 F (36.6 C) (08/17 0500) Pulse Rate:  [61-71] 61 (08/17 0500) Resp:  [18-20] 20 (08/17 0500) BP: (131-165)/(51-85) 147/85 mmHg (08/17 0500) SpO2:  [100 %] 100 % (08/17 0500) Weight:  [147 lb 1.6 oz (66.724 kg)-148 lb 12.8 oz (67.495 kg)] 147 lb 1.6 oz (66.724 kg) (08/17 0751) Weight change: 4 lb 6.4 oz (1.996 kg) Last BM Date: 07/10/15  Intake/Output from previous day: 08/16 0701 - 08/17 0700 In: 1820.8 [P.O.:480; I.V.:1220.8; IV Piggyback:120] Out: 5681 [Urine:3325] Total I/O In: 240 [P.O.:240] Out: -    Physical Exam: General- pt is awake,sitting comfortably in recliner Resp- No acute REsp distress, Decreased bs at bases. CVS- S1S2 regular in rate and rhythm GIT- BS+, soft, NT, ND EXT- No LE Edema, No Cyanosis   Lab Results: CBC  Recent Labs   07/11/15 0543  WBC 4.7  HGB 8.8*  HCT 28.4*  PLT 182    BMET  Recent Labs  07/10/15 0529 07/11/15 0543  NA 139 137  K 4.2 4.0  CL 107 104  CO2 24 26  GLUCOSE 124* 165*  BUN 78* 72*  CREATININE 3.61* 3.32*  CALCIUM 7.2* 7.4*    Creat trend  2016 4.47=>4.59=>3.85=>3.61=>3.32  7.16=>3.0( July admission)  4.93=>3.17( June admission)  4.29=>3.64=>4.10=>3.95 ( Jan/Feb admission) 2015 2.0--3.75( Multiple admission) 2014 1.4--2.5 20131.5--1.7 20121.26   MICRO Recent Results (from the past 240 hour(s))  Culture, Urine     Status: None   Collection Time: 07/06/15 12:10 PM  Result Value Ref Range Status   Specimen Description URINE, CATHETERIZED  Final   Special Requests NONE  Final   Culture   Final    MULTIPLE SPECIES PRESENT, SUGGEST RECOLLECTION Performed at [facility]    Report Status 07/07/2015 FINAL  Final      Lab Results  Component Value Date   PTH 268.4* 04/28/2014   CALCIUM 7.4* 07/11/2015   PHOS 4.4 05/30/2015         Impression: 1)Renal AKI sec to ATN/Cardiorenal  AKI on CKD  AKi improving                 Creat near to baseline  CKD stage 4 .  Creat at baseline  CKD since 2013  CKD secondary to DM/HTN/Age associated decline  Progression of CKD marked with multiple AKI in 2015 and now in 2016   2)HTN  Medication- On Diuretics. On Beta blockers On Vasodilators  3)Anemia HGb at goal (9--11) On ESA  4)CKD Mineral-Bone Disorder PTH elevated. Secondary Hyperparathyroidism present. Phosphorus at goal.    5)Electrolytes Hyperkalemic  Now better Normonatremic   6)Acid base Co2 at goal Acidosis now better  7) CHF- admitted with CHF exacerbation  Now better   Plan:  Will reduce IVf rate Will reduce lasix dose Will follow bmet     [place]  S 07/11/2015, 9:56 AM

## 2015-07-12 ENCOUNTER — Encounter (HOSPITAL_COMMUNITY): Payer: Medicare Other

## 2015-07-12 ENCOUNTER — Other Ambulatory Visit (HOSPITAL_COMMUNITY): Payer: Medicare Other

## 2015-07-12 LAB — BASIC METABOLIC PANEL
ANION GAP: 6 (ref 5–15)
BUN: 72 mg/dL — ABNORMAL HIGH (ref 6–20)
CALCIUM: 7.6 mg/dL — AB (ref 8.9–10.3)
CO2: 28 mmol/L (ref 22–32)
CREATININE: 3.17 mg/dL — AB (ref 0.44–1.00)
Chloride: 105 mmol/L (ref 101–111)
GFR, EST AFRICAN AMERICAN: 16 mL/min — AB (ref >60)
GFR, EST NON AFRICAN AMERICAN: 13 mL/min — AB (ref >60)
Glucose, Bld: 122 mg/dL — ABNORMAL HIGH (ref 65–99)
Potassium: 4.2 mmol/L (ref 3.5–5.1)
SODIUM: 139 mmol/L (ref 135–145)

## 2015-07-12 LAB — GLUCOSE, CAPILLARY
GLUCOSE-CAPILLARY: 155 mg/dL — AB (ref 65–99)
GLUCOSE-CAPILLARY: 114 mg/dL — AB (ref 65–99)
Glucose-Capillary: 115 mg/dL — ABNORMAL HIGH (ref 65–99)
Glucose-Capillary: 178 mg/dL — ABNORMAL HIGH (ref 65–99)

## 2015-07-12 MED ORDER — SODIUM BICARBONATE 8.4 % IV SOLN
INTRAVENOUS | Status: AC
  Filled 2015-07-12: qty 50

## 2015-07-12 MED ORDER — INSULIN DETEMIR 100 UNIT/ML ~~LOC~~ SOLN
4.0000 [IU] | Freq: Every day | SUBCUTANEOUS | Status: DC
  Administered 2015-07-12: 4 [IU] via SUBCUTANEOUS
  Filled 2015-07-12 (×4): qty 0.04

## 2015-07-12 NOTE — Progress Notes (Signed)
Subjective: Patient denies any difficulty breathing. Her appetite is good and she doesn't have any nausea vomiting.  Objective: Vital signs in last 24 hours: Temp:  [98 F (36.7 C)-98.7 F (37.1 C)] 98 F (36.7 C) (08/18 0537) Pulse Rate:  [60-70] 60 (08/18 0537) Resp:  [18-21] 21 (08/18 0537) BP: (126-149)/(40-69) 135/69 mmHg (08/18 0537) SpO2:  [100 %] 100 % (08/18 0537) Weight:  [147 lb 11.2 oz (66.996 kg)] 147 lb 11.2 oz (66.996 kg) (08/18 0537)  Intake/Output from previous day: 08/17 0701 - 08/18 0700 In: 2221.3 [P.O.:480; I.V.:1741.3] Out: 1750 [Urine:1750] Intake/Output this shift:     Recent Labs  07/11/15 0543  HGB 8.8*    Recent Labs  07/11/15 0543  WBC 4.7  RBC 3.14*  HCT 28.4*  PLT 182    Recent Labs  07/11/15 0543 07/12/15 0521  NA 137 139  K 4.0 4.2  CL 104 105  CO2 26 28  BUN 72* 72*  CREATININE 3.32* 3.17*  GLUCOSE 165* 122*  CALCIUM 7.4* 7.6*   No results for input(s): LABPT, INR in the last 72 hours.  Generally : Patient is alert. She doesn't seem to be in any apparent distress. She just finished eating her breakfast. Chest: Decreased breath sound,  Heart exam revealed regular rate and rhythm no murmur Extremities: No edema  Assessment/Plan: Problem #1 acute kidney injury superimposed on chronic: Her renal function continued to improve and is returning to her baseline. Patient presently remains non-oliguric. Problem #2 metabolic acidosis: Patient on sodium bicarbonate supplement and her metabolic acidosis has corrected.  Problem #3 difficulty breathing: Patient with ischemic cardiomyopathy . patient had 3300 mL of urine output and presently asymptomatic.  Problem #4 anemia; Her hemoglobin is stable Problem #5 chronic renal failure: Stage IV. It was thought to be secondary to diabetes/hypertension/cardiorenal Problem #6 history of dementia Problem #7 hyperkalemia: potassium is normal Plan:1] We'll continue with present treatment  2  ] we'll check her basic metabolic panel in the morning.    Tobey Lippard S 07/12/2015, 9:03 AM

## 2015-07-12 NOTE — Discharge Summary (Signed)
Physician Discharge Summary  [name] AOZ:[id number] DOB: Aug 23, 1941 DOA: 07/03/2015  PCP: [name], MD  Admit date: 07/03/2015 Discharge date: 07/12/2015  Recommendations for Outpatient Follow-up:  1. Follow up with Nephrology as an outpatient in one-two weeks 2. Follow up with Cardiology as an outpatient in 2-3 weeks  Follow-up Information    Follow up with [facility].   Contact information:   [address] [place] [address] [phone]       Follow up with [name], MD On 07/17/2015.   Specialty:  Family Medicine   Why:  at 11:00 am   Contact information:   [address] [place] [address] [place] [address] [phone]       Follow up with [facility] S, MD In 2 weeks.   Specialty:  Nephrology   Contact information:   [address] [place] [address] [phone]      Discharge Diagnoses:  1. 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 AKI superimposed on chronic CKD, stage IV-V 3. Hyperkalemia 4. Metabolic acidosis secondary to AKI/CKD.  5. Anemia of CKD 6. Pyuria with unrevealing culture. 7. Chronic hypoxic respiratory failure. 8. Dementia 9. DM.  10. HTN 11. Seizure disorder.  Discharge Condition: Improved Disposition: Home with [facility] services  Diet recommendation: Heart healthy  Filed Weights   07/11/15 0500 07/11/15 0751 07/12/15 0537  Weight: 67.495 kg (148 lb 12.8 oz) 66.724 kg (147 lb 1.6 oz) 66.996 kg (147 lb 11.2 oz)    History of present illness:  21 yof with a hx of CKD admitted 8/9 with SOB; admitted for acute CHF, AKI superimposed on CKD and seen by nephrology--treated with Lasix, IVF and bicarb.  Hospital Course:  The pt improved over the course of their hospitalization after being treated with Lasix, IVF and bicarb. Nephrology followed and directed management; after further consideration, feel her  presentation was more AKI secondary to poor oral intake rather than acute CHF. Has had recurrent presentations with AKI.Marland Kitchen  Renal function has since improved and hospitalization was uncomplicated. Individual issues as below.   1. AKI superimposed on chronic CKD, stage IV-V.back to baseline.  2. 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 RV dysfunction appears to be compensated. . Echo with WMA. compared to previous studies, WMA are old. No ACE-I due to poor renal function.  3. Hyperkalemia, resolved with Kayexalete.  4. Metabolic acidosis secondary to AKI/CKD. Resolved.  5. Anemia of CKD. Followed by GI and hematology as an outpatient. Currently on Aranesp 6. Pyuria with unrevealing culture. 7. Chronic hypoxic respiratory failure. stable 8. Dementia. At baseline 9. DM. Remains sable 10. Seizure disorder. No seizures while in hospital. Continue [place]  Consultants:  Nephrology  Procedures:  Echo Study Conclusions  - Left ventricle: The cavity size was normal. Wall thickness was increased in a pattern of moderate LVH. Systolic function was normal. The estimated ejection fraction was in the range of 55% to 60%. Features are consistent with a pseudonormal left ventricular filling pattern, with concomitant abnormal relaxation and increased filling pressure (grade 2 diastolic dysfunction). Doppler parameters are consistent with high ventricular filling pressure. - Regional wall motion abnormality: Akinesis and scarring of the basal inferior and basal-mid inferolateral myocardium; severe hypokinesis and scarring of the mid anterolateral myocardium; severe hypokinesis of the mid inferior myocardium. - Aortic valve: Mildly calcified  annulus. Mildly thickened leaflets. - Mitral valve: There is mildly restricted mobility of the posterior leaflet due to scarring of the basal  inferolateral wall. Moderately thickened anterior and mildly thickened posterior leaflets. Severe eccentric regurgitation. Eccentricity may lead to an underestimation of true severity. Vena contracta 0.4 cm. - Left atrium: The atrium was moderately dilated. - Right ventricle: The cavity size was mildly to moderately dilated. Systolic function was moderately to severely reduced. - Right atrium: The atrium was moderately dilated. - Atrial septum: No defect or patent foramen ovale was identified. - Tricuspid valve: Mildly thickened leaflets. There was mild-moderate regurgitation. - Pulmonic valve: Mildly thickened leaflets. There was mild regurgitation. - Pulmonary arteries: Systolic pressure was moderately increased. PA peak pressure: 52 mm Hg (S). - Inferior vena cava: The vessel was dilated. The respirophasic diameter changes were blunted (< 50%), consistent with elevated central venous pressure.  Antibiotics:  None   Discharge Instructions   Current Discharge Medication List    CONTINUE these medications which have CHANGED   Details  insulin detemir (LEVEMIR) 100 UNIT/ML injection Inject 0.04 mLs (4 Units total) into the skin at bedtime.      CONTINUE these medications which have NOT CHANGED   Details  calcitRIOL (ROCALTROL) 0.25 MCG capsule Take 1 capsule (0.25 mcg total) by mouth daily. Qty: 31 capsule, Refills: 0    donepezil (ARICEPT) 10 MG tablet Take 10 mg by mouth at bedtime.     folic acid (FOLVITE) 1 MG tablet Take 1 mg by mouth every morning.     hydrALAZINE (APRESOLINE) 25 MG tablet Take 75 mg by mouth every 8 (eight) hours.     isosorbide mononitrate (IMDUR) 30 MG 24 hr tablet Take 0.5 tablets (15 mg total) by mouth every morning. Qty: 30 tablet, Refills: 0    levETIRAcetam (KEPPRA) 100 MG/ML solution Take 500 mg by mouth 2 (two) times daily.     linagliptin (TRADJENTA) 5 MG TABS tablet Take 5 mg by mouth every morning.       Maltodextrin-Xanthan Gum (RESOURCE THICKENUP CLEAR) POWD Take 120 g by mouth as needed.    metoprolol succinate (TOPROL-XL) 100 MG 24 hr tablet Take 50 mg by mouth daily. Take with or immediately following a meal.    mupirocin ointment (BACTROBAN) 2 % Apply 1 application topically 2 (two) times daily.     pantoprazole (PROTONIX) 40 MG tablet Take 40 mg by mouth every morning.    pravastatin (PRAVACHOL) 20 MG tablet Take 20 mg by mouth at bedtime.       STOP taking these medications     HUMALOG 100 UNIT/ML injection      insulin aspart (NOVOLOG) 100 UNIT/ML injection        No Known Allergies  The results of significant diagnostics from this hospitalization (including imaging, microbiology, ancillary and laboratory) are listed below for reference.    Significant Diagnostic Studies: Dg Chest Port 1 View  07/06/2015   CLINICAL DATA:  Increased shortness of breath.  EXAM: PORTABLE CHEST - 1 VIEW  COMPARISON:  07/03/2015  FINDINGS: There is mild bilateral interstitial thickening. There is no focal parenchymal opacity. There is no pleural effusion or pneumothorax. There is stable cardiomegaly. There is evidence of prior CABG.  The osseous structures are unremarkable.  IMPRESSION: Cardiomegaly with mild pulmonary vascular congestion.   Electronically Signed   By: Kathreen Devoid   On: 07/06/2015 11:03   Dg Chest Port 1 View  07/03/2015   CLINICAL DATA:  Cough.  EXAM:  PORTABLE CHEST - 1 VIEW  COMPARISON:  May 28, 2015.  FINDINGS: Stable cardiomegaly. Status post coronary artery bypass graft. No pneumothorax or significant pleural effusion is noted. Left lung appears clear. Mild right basilar subsegmental atelectasis is noted. Bony thorax is intact.  IMPRESSION: Mild right basilar subsegmental atelectasis.   Electronically Signed   By: Marijo Conception, M.D.   On: 07/03/2015 15:25    Microbiology: Recent Results (from the past 240 hour(s))  Culture, Urine     Status: None   Collection Time:  07/06/15 12:10 PM  Result Value Ref Range Status   Specimen Description URINE, CATHETERIZED  Final   Special Requests NONE  Final   Culture   Final    MULTIPLE SPECIES PRESENT, SUGGEST RECOLLECTION Performed at [facility]    Report Status 07/07/2015 FINAL  Final     Labs: Basic Metabolic Panel:  Recent Labs Lab 07/07/15 0550 07/08/15 0547 07/09/15 0535 07/10/15 0529 07/11/15 0543  NA 139 139 139 139 137  K 4.6 4.4 3.9 4.2 4.0  CL 114* 112* 109 107 104  CO2 16* 20* 21* 24 26  GLUCOSE 101* 114* 104* 124* 165*  BUN 77* 81* 79* 78* 72*  CREATININE 4.49* 4.18* 3.85* 3.61* 3.32*  CALCIUM 7.6* 7.5* 7.4* 7.2* 7.4*   CBC:  Recent Labs Lab 07/05/15 0635 07/06/15 0606 07/08/15 0547 07/11/15 0543  WBC 4.9 4.7 4.9 4.7  HGB 8.6* 9.5* 8.9* 8.8*  HCT 28.8* 32.0* 28.6* 28.4*  MCV 93.5 93.6 91.1 90.4  PLT 199 191 180 182       Recent Labs  01/02/15 1125 05/28/15 1148 07/03/15 1514  BNP >4500.0* 1084.0* 3737.0*    CBG:  Recent Labs Lab 07/10/15 2126 07/11/15 0717 07/11/15 1156 07/11/15 1620 07/11/15 2155  GLUCAP 197* 142* 140* 191* 151*    Principal Problem:   AKI (acute kidney injury) Active Problems:   Seizure disorder   CKD (chronic kidney disease) stage 4, GFR 15-29 ml/min   Dementia   Acute on chronic combined systolic and diastolic congestive heart failure   Type I diabetes mellitus, well controlled   Anemia in chronic kidney disease (CKD)   Time coordinating discharge: 35 minutes  Signed:  [name], MD [facility] 07/12/2015, 6:16 AM  I, Salvadore Oxford, acting a scribe, recorded this note contemporaneously in the presence of Dr. Melene Plan. Sarajane Jews, M.D. on 07/12/2015  I have reviewed the above documentation for accuracy and completeness, and I agree with the above. [name], MD

## 2015-07-12 NOTE — Care Management Important Message (Signed)
Important Message  Patient Details  Name: [name] MRN: [id number] Date of Birth: [birth year]   Medicare Important Message Given:  Yes-third notification given    Joylene Draft, RN 07/12/2015, 4:13 PM

## 2015-07-12 NOTE — Progress Notes (Signed)
PROGRESS NOTE  [name] NAT:[id number] DOB: 1941-05-09 DOA: 07/03/2015 PCP: [name], MD  Arnoldo Lenis, MD  Summary: 32 yof with a hx of CKD admitted 8/9 with SOB; admitted for acute CHF, AKI superimposed on CKD and seen by nephrology--treated with Lasix, IVF and bicarb.  Assessment/Plan: 1. AKI superimposed on chronic CKD, stage IV-V. Improving daily. I/O not accurate secondary to incontinence.  2. 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 Improving daily, appears to be compensated. Echo with WMA. compared to previous studies, WMA are old. No ACE-I due to poor renal function.  3. Hyperkalemia, resolved with Kayexalete.  4. Metabolic acidosis secondary to AKI/CKD. Resolved.  5. Anemia of CKD. Followed by GI and hematology as an outpatient. Currently on Aranesp 6. Pyuria with unrevealing culture. 7. Chronic hypoxic respiratory failure. 8. Dementia. Appears to be at baseline 9. DM. Stable, well controlled 10. Seizure disorder. No seizures while in hospital. Continue Keppra   Continues to improve  Renal function near baseline, discussed with nephrology. Plan home tomorrow with outpatient follow-up with nephrology and cardiology.  Discussed with daughter [name] by telephone.   Code Status: FULL DVT prophylaxis: Heparin  Family Communication: pt is alone. There are no questions at this time.   Disposition Plan: discharge today  [name], MD  [facility]  Pager [phone] If 7PM-7AM, please contact night-coverage at [email or web address], password [facility] 07/12/2015, 6:14 AM  LOS: 8 days   Consultants:  Nephrology  Procedures:  Echo Study Conclusions  - Left ventricle: The cavity size was normal. Wall thickness was increased in a pattern of moderate LVH. Systolic function was normal. The estimated ejection fraction was in the range of 55% to 60%. Features are  consistent with a pseudonormal left ventricular filling pattern, with concomitant abnormal relaxation and increased filling pressure (grade 2 diastolic dysfunction). Doppler parameters are consistent with high ventricular filling pressure. - Regional wall motion abnormality: Akinesis and scarring of the basal inferior and basal-mid inferolateral myocardium; severe hypokinesis and scarring of the mid anterolateral myocardium; severe hypokinesis of the mid inferior myocardium. - Aortic valve: Mildly calcified annulus. Mildly thickened leaflets. - Mitral valve: There is mildly restricted mobility of the posterior leaflet due to scarring of the basal inferolateral wall. Moderately thickened anterior and mildly thickened posterior leaflets. Severe eccentric regurgitation. Eccentricity may lead to an underestimation of true severity. Vena contracta 0.4 cm. - Left atrium: The atrium was moderately dilated. - Right ventricle: The cavity size was mildly to moderately dilated. Systolic function was moderately to severely reduced. - Right atrium: The atrium was moderately dilated. - Atrial septum: No defect or patent foramen ovale was identified. - Tricuspid valve: Mildly thickened leaflets. There was mild-moderate regurgitation. - Pulmonic valve: Mildly thickened leaflets. There was mild regurgitation. - Pulmonary arteries: Systolic pressure was moderately increased. PA peak pressure: 52 mm Hg (S). - Inferior vena cava: The vessel was dilated. The respirophasic diameter changes were blunted (< 50%), consistent with elevated central venous pressure.  Antibiotics:  None  HPI/Subjective: Feels fine. No pain, n/v, dyspnea.  Eating okay.  Didn't sleep well due to noise.  Objective: Filed Vitals:   07/11/15 1301 07/11/15 1337 07/11/15 2150 07/12/15 0537  BP: 126/48 131/40 149/53 135/69  Pulse: 70  69 60  Temp: 98.7 F (37.1 C)  98.4 F (36.9 C) 98  F (36.7 C)  TempSrc: Oral  Oral Oral  Resp: 18  20 21   Height:  Weight:    66.996 kg (147 lb 11.2 oz)  SpO2: 100%  100% 100%    Intake/Output Summary (Last 24 hours) at 07/12/15 0614 Last data filed at 07/12/15 0539  Gross per 24 hour  Intake   1355 ml  Output   1750 ml  Net   -395 ml     Filed Weights   07/11/15 0500 07/11/15 0751 07/12/15 0537  Weight: 67.495 kg (148 lb 12.8 oz) 66.724 kg (147 lb 1.6 oz) 66.996 kg (147 lb 11.2 oz)    Exam:     Afebrile, VSS, no hypoxia  General:  Appears comfortable, calm. Cardiovascular: Regular rate and rhythm, no murmur, rub or gallop. No lower extremity edema. Telemetry: Sinus rhythm, no arrhythmias  Respiratory: Clear to auscultation bilaterally, no wheezes, rales or rhonchi. Normal respiratory effort. Abdomen: soft, ntnd Musculoskeletal: grossly normal tone bilateral upper and lower extremities Psychiatric: grossly normal mood and affect, speech fluent and appropriate Neurologic: grossly non-focal.   New data reviewed:  CBG stable  Creatinine continues to improve 3.17  CO2 normal   Pertinent data since admission:  EKG independently reviewed, sinus bradycardia, NSST changes   Pending data:    Scheduled Meds: . antiseptic oral rinse  7 mL Mouth Rinse BID  . calcitRIOL  0.25 mcg Oral Daily  . donepezil  10 mg Oral QHS  . furosemide  60 mg Intravenous BID  . heparin  5,000 Units Subcutaneous 3 times per day  . hydrALAZINE  75 mg Oral 3 times per day  . insulin aspart  0-5 Units Subcutaneous QHS  . insulin aspart  0-9 Units Subcutaneous TID WC  . isosorbide mononitrate  15 mg Oral q morning - 10a  . levETIRAcetam  500 mg Oral BID  . metoprolol succinate  50 mg Oral Daily  . pantoprazole  40 mg Oral q morning - 10a  . pravastatin  20 mg Oral QHS  . sodium chloride  3 mL Intravenous Q12H  . sodium chloride  3 mL Intravenous Q12H   Continuous Infusions: . sodium chloride 0.45 % 1,000 mL with sodium  bicarbonate 50 mEq infusion 75 mL/hr at 07/12/15 7408    Principal Problem:   Acute on chronic combined systolic and diastolic congestive heart failure Active Problems:   Seizure disorder   CKD (chronic kidney disease) stage 4, GFR 15-29 ml/min   Dementia   AKI (acute kidney injury)   Type I diabetes mellitus, well controlled   Anemia in chronic kidney disease (CKD)   I, Arielle Khosrowpour, acting a scribe, recorded this note contemporaneously in the presence of Dr. Melene Plan. Sarajane Jews, M.D. on 07/12/2015   I have reviewed the above documentation for accuracy and completeness, and I agree with the above. [name], MD

## 2015-07-13 ENCOUNTER — Encounter (HOSPITAL_COMMUNITY): Payer: Medicare Other

## 2015-07-13 ENCOUNTER — Other Ambulatory Visit (HOSPITAL_COMMUNITY): Payer: Medicare Other

## 2015-07-13 ENCOUNTER — Other Ambulatory Visit: Payer: Self-pay | Admitting: *Deleted

## 2015-07-13 LAB — GLUCOSE, CAPILLARY
GLUCOSE-CAPILLARY: 69 mg/dL (ref 65–99)
Glucose-Capillary: 125 mg/dL — ABNORMAL HIGH (ref 65–99)

## 2015-07-13 MED ORDER — INSULIN DETEMIR 100 UNIT/ML ~~LOC~~ SOLN: 4.0000 [IU] | Freq: Every day | SUBCUTANEOUS | Status: DC

## 2015-07-13 NOTE — Care Management Note (Signed)
Case Management Note  Patient Details  Name: [name] MRN: [id number] Date of Birth: Jul 25, 1941  Subjective/Objective:                    Action/Plan:   Expected Discharge Date:  07/06/15               Expected Discharge Plan:  [place]  In-House Referral:  NA  Discharge planning Services  CM Consult  Post Acute Care Choice:  Resumption of Svcs/PTA Provider Choice offered to:  Adult Children  DME Arranged:    DME Agency:     HH Arranged:  RN [place] Agency:  [place]  Status of Service:  Completed, signed off  Medicare Important Message Given:  Yes-third notification given Date Medicare IM Given:    Medicare IM give by:    Date Additional Medicare IM Given:    Additional Medicare Important Message give by:     If discussed at [place] of Stay Meetings, dates discussed:    Additional Comments: Pt discharged home today with resumption of [facility] RN (per pts daughter choice). [name] of [facility] is aware and will collect the pts information from the chart. [place] services to resume within 48 hours of discharge. Pt has home O2 in place. Pt and pts nurse aware of discharge arrangements. Christinia Gully [place], RN 07/13/2015, 8:47 AM

## 2015-07-13 NOTE — Progress Notes (Signed)
PROGRESS NOTE  [name] Kipp JHE:[id number] DOB: 1941/02/03 DOA: 07/03/2015 PCP: [name], MD  Arnoldo Lenis, MD  Summary: 30 yof with a hx of CKD admitted 8/9 with SOB; admitted for acute CHF, AKI superimposed on CKD and seen by nephrology--treated with Lasix, IVF and bicarb.  Assessment/Plan: 1. AKI superimposed on chronic CKD, stage IV-V. Back to baseline.  2. 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 RV dysfunction appears to be compensated. Echo with WMA. compared to previous studies, WMA are old. No ACE-I due to poor renal function.  3. Hyperkalemia, resolved with Kayexalete.  4. Metabolic acidosis secondary to AKI/CKD. Resolved.  5. Anemia of CKD. Followed by GI and hematology as an outpatient. Currently on Aranesp 6. Pyuria with unrevealing culture. 7. Chronic hypoxic respiratory failure. Stable 8. Dementia. At baseline 9. DM. Remains stable 10. Seizure disorder. No seizures while in hospital. Continue Keppra     Overall improved  Discussed with nephrology, plan discharge home, hold diuretics   Outpatient follow up with nephrology in one week, outpatient follow up with cardiology in the next 2-3 weeks   Discussed with daughter by telephone last evening    Code Status: FULL DVT prophylaxis: Heparin  Family Communication:pt is alone. No questions at this time  Disposition Plan: discharge today  [name], MD  [facility]  Pager [phone] If 7PM-7AM, please contact night-coverage at [email or web address], password [facility] 07/13/2015, 6:07 AM  LOS: 9 days   Consultants:  Nephrology  Procedures:  Echo Study Conclusions  - Left ventricle: The cavity size was normal. Wall thickness was increased in a pattern of moderate LVH. Systolic function was normal. The estimated ejection fraction was in the range of 55% to 60%. Features are consistent with a  pseudonormal left ventricular filling pattern, with concomitant abnormal relaxation and increased filling pressure (grade 2 diastolic dysfunction). Doppler parameters are consistent with high ventricular filling pressure. - Regional wall motion abnormality: Akinesis and scarring of the basal inferior and basal-mid inferolateral myocardium; severe hypokinesis and scarring of the mid anterolateral myocardium; severe hypokinesis of the mid inferior myocardium. - Aortic valve: Mildly calcified annulus. Mildly thickened leaflets. - Mitral valve: There is mildly restricted mobility of the posterior leaflet due to scarring of the basal inferolateral wall. Moderately thickened anterior and mildly thickened posterior leaflets. Severe eccentric regurgitation. Eccentricity may lead to an underestimation of true severity. Vena contracta 0.4 cm. - Left atrium: The atrium was moderately dilated. - Right ventricle: The cavity size was mildly to moderately dilated. Systolic function was moderately to severely reduced. - Right atrium: The atrium was moderately dilated. - Atrial septum: No defect or patent foramen ovale was identified. - Tricuspid valve: Mildly thickened leaflets. There was mild-moderate regurgitation. - Pulmonic valve: Mildly thickened leaflets. There was mild regurgitation. - Pulmonary arteries: Systolic pressure was moderately increased. PA peak pressure: 52 mm Hg (S). - Inferior vena cava: The vessel was dilated. The respirophasic diameter changes were blunted (< 50%), consistent with elevated central venous pressure.  Antibiotics:  None  HPI/Subjective: No pain, n/v/abdominal pain, sob. Eating well. Feels good  Objective: Filed Vitals:   07/12/15 0537 07/12/15 1300 07/12/15 2304 07/13/15 0501  BP: 135/69 137/54 132/55 136/64  Pulse: 60 65 68 72  Temp: 98 F (36.7 C) 98 F (36.7 C) 98.1 F (36.7 C) 98 F (36.7 C)  TempSrc: Oral  Oral Oral Oral  Resp: 21 20 20 20   Height:  Weight: 66.996 kg (147 lb 11.2 oz)   66.044 kg (145 lb 9.6 oz)  SpO2: 100% 100% 100% 100%    Intake/Output Summary (Last 24 hours) at 07/13/15 0607 Last data filed at 07/13/15 0502  Gross per 24 hour  Intake 1331.25 ml  Output   2651 ml  Net -1319.75 ml     Filed Weights   07/11/15 0751 07/12/15 0537 07/13/15 0501  Weight: 66.724 kg (147 lb 1.6 oz) 66.996 kg (147 lb 11.2 oz) 66.044 kg (145 lb 9.6 oz)    Exam:     Afebrile, VSS, no hypoxia  General:  Appears calm and comfortable ENT: grossly normal hearing, lips & tongue Cardiovascular: RRR, no m/r/g. No LE edema. Telemetry: SR, no arrhythmias  Respiratory: CTA bilaterally, no w/r/r. Normal respiratory effort. Abdomen: soft, ntnd Psychiatric: grossly normal mood and affect, speech fluent and appropriate Neurologic: grossly non-focal.   New data reviewed:  CBG stable   Pertinent data since admission:  EKG independently reviewed, sinus bradycardia, NSST changes   Pending data:    Scheduled Meds: . antiseptic oral rinse  7 mL Mouth Rinse BID  . calcitRIOL  0.25 mcg Oral Daily  . donepezil  10 mg Oral QHS  . furosemide  60 mg Intravenous BID  . heparin  5,000 Units Subcutaneous 3 times per day  . hydrALAZINE  75 mg Oral 3 times per day  . insulin aspart  0-5 Units Subcutaneous QHS  . insulin aspart  0-9 Units Subcutaneous TID WC  . insulin detemir  4 Units Subcutaneous QHS  . isosorbide mononitrate  15 mg Oral q morning - 10a  . levETIRAcetam  500 mg Oral BID  . metoprolol succinate  50 mg Oral Daily  . pantoprazole  40 mg Oral q morning - 10a  . pravastatin  20 mg Oral QHS  . sodium chloride  3 mL Intravenous Q12H  . sodium chloride  3 mL Intravenous Q12H   Continuous Infusions: . sodium chloride 0.45 % 1,000 mL with sodium bicarbonate 50 mEq infusion 75 mL/hr at 07/12/15 1826    Principal Problem:   Acute on chronic combined systolic and diastolic  congestive heart failure Active Problems:   Seizure disorder   CKD (chronic kidney disease) stage 4, GFR 15-29 ml/min   Dementia   AKI (acute kidney injury)   Type I diabetes mellitus, well controlled   Anemia in chronic kidney disease (CKD)   I, Arielle Khosrowpour, acting a scribe, recorded this note contemporaneously in the presence of Dr. Melene Plan. Sarajane Jews, M.D. on 07/13/2015   I have reviewed the above documentation for accuracy and completeness, and I agree with the above. [name], MD

## 2015-07-13 NOTE — Progress Notes (Signed)
Pt CBG 69. Pt asymptomatic. Pt offered 4 oz regular drink. Will continue to monitor patient

## 2015-07-13 NOTE — Progress Notes (Signed)
Pt's IV catheter removed and intact. Pt's IV site clean dry and intact. Discharge instructions, medications and follow up appointments reviewed and discussed with patient's daughter. All questions answered and no further questions at this time. Pt in stable condition and in no acute distress at time of discharge. Pt escorted by nurse tech.

## 2015-07-13 NOTE — Progress Notes (Signed)
Subjective: No new complaint  Objective: Vital signs in last 24 hours: Temp:  [98 F (36.7 C)-98.1 F (36.7 C)] 98 F (36.7 C) (08/19 0501) Pulse Rate:  [65-72] 72 (08/19 0501) Resp:  [20] 20 (08/19 0501) BP: (132-137)/(54-64) 136/64 mmHg (08/19 0501) SpO2:  [100 %] 100 % (08/19 0501) Weight:  [145 lb 9.6 oz (66.044 kg)] 145 lb 9.6 oz (66.044 kg) (08/19 0501)  Intake/Output from previous day: 08/18 0701 - 08/19 0700 In: 1331.3 [P.O.:480; I.V.:851.3] Out: 2651 [Urine:2650; Stool:1] Intake/Output this shift: Total I/O In: 300 [P.O.:300] Out: 1 [Stool:1]   Recent Labs  07/11/15 0543  HGB 8.8*    Recent Labs  07/11/15 0543  WBC 4.7  RBC 3.14*  HCT 28.4*  PLT 182    Recent Labs  07/11/15 0543 07/12/15 0521  NA 137 139  K 4.0 4.2  CL 104 105  CO2 26 28  BUN 72* 72*  CREATININE 3.32* 3.17*  GLUCOSE 165* 122*  CALCIUM 7.4* 7.6*   No results for input(s): LABPT, INR in the last 72 hours.  Generally : Patient is alert. She doesn't seem to be in any apparent distress. She just finished eating her breakfast. Chest: Decreased breath sound,  Heart exam revealed regular rate and rhythm no murmur Extremities: No edema  Assessment/Plan: Problem #1 acute kidney injury superimposed on chronic: Patient remained none oliguric and her renal function is stable Problem #2 metabolic acidosis: Patient on sodium bicarbonate supplement and her metabolic acidosis has corrected.  Problem #3 difficulty breathing: Patient with ischemic cardiomyopathy . patient had 2600 mL of urine output and presently asymptomatic.  Problem #4 anemia; Her hemoglobin is low but stable Problem #5 chronic renal failure: Stage IV. It was thought to be secondary to diabetes/hypertension/cardiorenal Problem #6 history of dementia Problem #7 hyperkalemia: potassium is normal Plan:1] d/c ivf. Because of recurrent dehydration when she goes home possibly from poor po intake we will hold out patient  diuretics and we will start her as an out patient if she she needs  2 ] I will see her in 2 weeks once she is discharged    [facility] S 07/13/2015, 9:49 AM

## 2015-07-13 NOTE — Patient Outreach (Signed)
TOC call week #1 Call to patient home, spoke with daughter, [name]. Patient was discharged this afternoon.  She reports patient has f/u appointment Tuesday. She is to discuss with MD if patient needs to resume torsemide and potassium. Patient weight is up to 150#, discharged with edema, no issues with breathing yet, daughter knows to monitor. Patient still has good appetite. Home health will [facility] with [facility]. Daughter does verbalize concern over patient care that she received while inpatient and that they sent her home with the extra fluid with no fluid pills. RNCM reinforced to daughter that she knows what to do for patient and to call MD as needed for questions and concerns. Plan to reschedule home visit for next week to Wednesday due to primary care appointment on Tuesday. Will continue TOC program. [name]. [name], RN, BSN, [place] [phone]

## 2015-07-16 ENCOUNTER — Ambulatory Visit (HOSPITAL_COMMUNITY): Payer: Medicare Other | Admitting: Hematology & Oncology

## 2015-07-16 ENCOUNTER — Encounter (HOSPITAL_BASED_OUTPATIENT_CLINIC_OR_DEPARTMENT_OTHER): Payer: Medicare Other

## 2015-07-16 ENCOUNTER — Encounter (HOSPITAL_COMMUNITY): Payer: Medicare Other

## 2015-07-16 ENCOUNTER — Encounter (HOSPITAL_BASED_OUTPATIENT_CLINIC_OR_DEPARTMENT_OTHER): Payer: Medicare Other | Admitting: Hematology & Oncology

## 2015-07-16 ENCOUNTER — Encounter (HOSPITAL_COMMUNITY): Payer: Self-pay | Admitting: Hematology & Oncology

## 2015-07-16 VITALS — BP 150/78 | HR 81 | Temp 98.1°F | Resp 22

## 2015-07-16 DIAGNOSIS — D62 Acute posthemorrhagic anemia: Secondary | ICD-10-CM

## 2015-07-16 DIAGNOSIS — G40909 Epilepsy, unspecified, not intractable, without status epilepticus: Secondary | ICD-10-CM

## 2015-07-16 DIAGNOSIS — D631 Anemia in chronic kidney disease: Secondary | ICD-10-CM

## 2015-07-16 DIAGNOSIS — Z794 Long term (current) use of insulin: Secondary | ICD-10-CM | POA: Diagnosis not present

## 2015-07-16 DIAGNOSIS — D509 Iron deficiency anemia, unspecified: Secondary | ICD-10-CM | POA: Diagnosis not present

## 2015-07-16 DIAGNOSIS — D649 Anemia, unspecified: Secondary | ICD-10-CM | POA: Diagnosis not present

## 2015-07-16 DIAGNOSIS — E119 Type 2 diabetes mellitus without complications: Secondary | ICD-10-CM | POA: Diagnosis not present

## 2015-07-16 DIAGNOSIS — Z79899 Other long term (current) drug therapy: Secondary | ICD-10-CM | POA: Diagnosis not present

## 2015-07-16 DIAGNOSIS — N184 Chronic kidney disease, stage 4 (severe): Secondary | ICD-10-CM | POA: Diagnosis present

## 2015-07-16 DIAGNOSIS — I252 Old myocardial infarction: Secondary | ICD-10-CM | POA: Diagnosis not present

## 2015-07-16 DIAGNOSIS — Z951 Presence of aortocoronary bypass graft: Secondary | ICD-10-CM | POA: Diagnosis not present

## 2015-07-16 DIAGNOSIS — I509 Heart failure, unspecified: Secondary | ICD-10-CM

## 2015-07-16 DIAGNOSIS — E780 Pure hypercholesterolemia: Secondary | ICD-10-CM | POA: Diagnosis not present

## 2015-07-16 DIAGNOSIS — Z8673 Personal history of transient ischemic attack (TIA), and cerebral infarction without residual deficits: Secondary | ICD-10-CM | POA: Diagnosis not present

## 2015-07-16 DIAGNOSIS — R569 Unspecified convulsions: Secondary | ICD-10-CM | POA: Diagnosis not present

## 2015-07-16 DIAGNOSIS — I129 Hypertensive chronic kidney disease with stage 1 through stage 4 chronic kidney disease, or unspecified chronic kidney disease: Secondary | ICD-10-CM | POA: Diagnosis not present

## 2015-07-16 DIAGNOSIS — Z87898 Personal history of other specified conditions: Secondary | ICD-10-CM | POA: Diagnosis not present

## 2015-07-16 DIAGNOSIS — Z87891 Personal history of nicotine dependence: Secondary | ICD-10-CM | POA: Diagnosis not present

## 2015-07-16 DIAGNOSIS — F0390 Unspecified dementia without behavioral disturbance: Secondary | ICD-10-CM

## 2015-07-16 DIAGNOSIS — I2510 Atherosclerotic heart disease of native coronary artery without angina pectoris: Secondary | ICD-10-CM | POA: Diagnosis not present

## 2015-07-16 LAB — CBC WITH DIFFERENTIAL/PLATELET
BASOS PCT: 1 % (ref 0–1)
Basophils Absolute: 0.1 10*3/uL (ref 0.0–0.1)
EOS ABS: 0.1 10*3/uL (ref 0.0–0.7)
Eosinophils Relative: 1 % (ref 0–5)
HEMATOCRIT: 27.9 % — AB (ref 36.0–46.0)
HEMOGLOBIN: 8.6 g/dL — AB (ref 12.0–15.0)
LYMPHS ABS: 0.7 10*3/uL (ref 0.7–4.0)
Lymphocytes Relative: 15 % (ref 12–46)
MCH: 28.1 pg (ref 26.0–34.0)
MCHC: 30.8 g/dL (ref 30.0–36.0)
MCV: 91.2 fL (ref 78.0–100.0)
MONO ABS: 0.6 10*3/uL (ref 0.1–1.0)
MONOS PCT: 11 % (ref 3–12)
Neutro Abs: 3.5 10*3/uL (ref 1.7–7.7)
Neutrophils Relative %: 72 % (ref 43–77)
Platelets: 170 10*3/uL (ref 150–400)
RBC: 3.06 MIL/uL — ABNORMAL LOW (ref 3.87–5.11)
RDW: 17.9 % — AB (ref 11.5–15.5)
WBC: 4.9 10*3/uL (ref 4.0–10.5)

## 2015-07-16 LAB — FERRITIN: FERRITIN: 399 ng/mL — AB (ref 11–307)

## 2015-07-16 MED ORDER — DARBEPOETIN ALFA 100 MCG/0.5ML IJ SOSY
100.0000 ug | PREFILLED_SYRINGE | Freq: Once | INTRAMUSCULAR | Status: AC
  Administered 2015-07-16: 100 ug via SUBCUTANEOUS
  Filled 2015-07-16: qty 0.5

## 2015-07-16 NOTE — Progress Notes (Signed)
Please see doctors appt for more information 

## 2015-07-16 NOTE — Progress Notes (Signed)
Kimberly Font, MD [address] [place]  [address]   DIAGNOSIS: Anemia of chronic renal disease, Stage IV  CURRENT THERAPY: Aranesp 80 mcg every 2 weeks.  Last Ferric Gluconate infusion was on 12/27/2014 at 250 mg.   INTERVAL HISTORY: [name] 74 y.o. female returns for followup of anemia of chronic renal disease, Stage IV, requiring ESA support with Aranesp.  Additionally, she has an element of iron deficiency anemia, requiring IV iron occasionally.   She has had several hospitalizations and has recently been discharged after acute CHF. 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 She has dementia, seizure disorder, diabetes.  The patient is here alone today, she presents in a wheelchair and has a nasal cannula in place. She notes that her daughter brought her to the appointment. She follows with Dr. Lowanda Foster for her renal disease. She has problems with recurrent hydration which limit the use of diuretics. It is felt she has poor po intake. Her weights are unreliable, she weighed 103 pounds on 6/28, 120 pounds on 7/28, and 145 pounds today. She is a poor historian.   Past Medical History  Diagnosis Date  . Type 2 diabetes mellitus   . Essential hypertension, benign   . History of stroke     Previously on Coumadin  . MI, old     Reported 5  . Seizure disorder   . Coronary atherosclerosis of native coronary artery     Multivessel status post CABG 2002  . History of GI bleed     Erosive gastritis and duodenitis by EGD 2002  . Mixed hyperlipidemia   . Ischemic cardiomyopathy     LVEF 40-45% March 2013  . Dementia   . CKD (chronic kidney disease) stage 4, GFR 15-29 ml/min   . CHF (congestive heart failure)   . Chronic respiratory failure with hypoxia   . Stroke   . Seizures   . Anemia of chronic renal failure, stage 4 (severe) 01/02/2015    has Acute CVA Left frontal lobe,; Hypertension; Diabetes mellitus;  Seizure disorder; ARF (acute renal failure); Neutropenia; CVA (cerebral infarction); Hyperglycemia; CKD (chronic kidney disease) stage 4, GFR 15-29 ml/min; Diabetes; Bilateral lower extremity edema; GI bleeding; Weakness generalized; Anemia associated with acute blood loss; NSTEMI (non-ST elevated myocardial infarction); Systolic CHF, chronic last EF 40% 3/13; Iron deficiency anemia; Diarrhea; Edema of right lower extremity; Dementia; Severe mitral regurgitation; Acute on chronic combined systolic and diastolic congestive heart failure; CAP (community acquired pneumonia); Hypoglycemia; Choking episode; Cardiomyopathy, ischemic; Acute renal failure superimposed on stage 4 chronic kidney disease; Lower GI bleeding; Swelling of both ankles; Seizure; Acute renal failure; Anemia; Hypernatremia; Dysphagia; AKI (acute kidney injury); Anemia of chronic renal failure, stage 4 (severe); Acute on chronic respiratory failure with hypoxia; Elevated troponin; UTI (urinary tract infection); History of CVA (cerebrovascular accident); Generalized weakness; Protein calorie malnutrition; Type I diabetes mellitus, well controlled; Essential hypertension; Chronic combined systolic and diastolic CHF (congestive heart failure); GI bleed; Anemia in chronic kidney disease (CKD); Acute on chronic renal failure; Blood loss anemia; Heme positive stool; Protein-calorie malnutrition, severe; Uremia; CKD stage 4 due to type 2 diabetes mellitus; Normocytic anemia; and SOB (shortness of breath) on her problem list.     has No Known Allergies.  Ms. Pruett does not currently have medications on file.  Past Surgical History  Procedure Laterality Date  . Coronary artery bypass graft  July 2002  LIMA to LAD, SVG to OM1, SVG to OM 2, SVG to RCA  . Tee without cardioversion  01/28/2012    Procedure: TRANSESOPHAGEAL ECHOCARDIOGRAM (TEE);  Surgeon: Lelon Perla, MD;  Location: [facility] ENDOSCOPY;  Service: Cardiovascular;  Laterality: [name];  .  Colonoscopy  2002  . Esophagogastroduodenoscopy  2002  . Esophagogastroduodenoscopy [name] 01/18/2014    OMA:YOKHT hiatal hernia. Abnormal gastric and duodenal bulbar mucosa of uncertain significance - status post gastric bx (chronic gastritis/H.pylori +  . Colonoscopy [name] 06/25/2014    Dr. Laural Golden: tubular adenoma, pandiverticulosis.   . Agile capsule [name] 07/06/2014    Procedure: AGILE CAPSULE;  Surgeon: Danie Binder, MD;  Location: [facility] ENDO SUITE;  Service: Endoscopy;  Laterality: [name];  730  . Esophagogastroduodenoscopy [name] 07/20/2014    Dr. Gala Romney: tiny gastric polyps not manipulated. minimal pigmentation of duodenal bulb likely not significant  . Givens capsule study [name] 07/20/2014    incomplete    Denies any headaches, dizziness, double vision, fevers, chills, night sweats, nausea, vomiting, diarrhea, constipation, chest pain, heart palpitations, shortness of breath, blood in stool, black tarry stool, urinary pain, urinary burning, urinary frequency, hematuria. Patient is a poor historian but denies any major problems.    PHYSICAL EXAMINATION  ECOG PERFORMANCE STATUS: 2 - Symptomatic, <50% confined to bed  Filed Vitals:   07/16/15 1257  BP: 150/78  Pulse: 81  Temp: 98.1 F (36.7 C)  Resp: 22    GENERAL:alert, no distress, , comfortable, cooperative, smiling and mentally handicapped Wearing O2 SKIN: skin color, texture, turgor are normal, no rashes or significant lesions HEAD: Normocephalic, No masses, lesions, tenderness or abnormalities EYES: normal, PERRLA, EOMI, Conjunctiva are pink and non-injected EARS: External ears normal OROPHARYNX:lips, buccal mucosa, and tongue normal and mucous membranes are moist  Poor dentition NECK: supple, no stridor, non-tender LYMPH:  no palpable lymphadenopathy, not examined BREAST:not examined LUNGS: Crackles at the bases of her lungs. HEART: regular rate & rhythm, no murmurs and no gallops ABDOMEN:Firm but without obvious HSM. No pain with  palpation BACK: Back symmetric, no curvature., No CVA tenderness EXTREMITIES:less then 2 second capillary refill, no joint deformities, effusion, or inflammation, no skin discoloration, no cyanosis  No leg edema noted. NEURO: unsteady gait but ambulates to the exam table with assistance. right sided weakness noted   LABORATORY DATA: CBC    Component Value Date/Time   WBC 4.7 07/11/2015 0543   RBC 3.14* 07/11/2015 0543   RBC 1.74* 01/16/2014 1918   HGB 8.8* 07/11/2015 0543   HCT 28.4* 07/11/2015 0543   PLT 182 07/11/2015 0543   MCV 90.4 07/11/2015 0543   MCH 28.0 07/11/2015 0543   MCHC 31.0 07/11/2015 0543   RDW 19.5* 07/11/2015 0543   LYMPHSABS 1.0 07/03/2015 1514   MONOABS 1.0 07/03/2015 1514   EOSABS 0.1 07/03/2015 1514   BASOSABS 0.1 07/03/2015 1514      Chemistry      Component Value Date/Time   NA 139 07/12/2015 0521   K 4.2 07/12/2015 0521   CL 105 07/12/2015 0521   CO2 28 07/12/2015 0521   BUN 72* 07/12/2015 0521   CREATININE 3.17* 07/12/2015 0521      Component Value Date/Time   CALCIUM 7.6* 07/12/2015 0521   CALCIUM 8.3* 04/28/2014 0815   ALKPHOS 102 07/03/2015 1514   AST 28 07/03/2015 1514   ALT 19 07/03/2015 1514   BILITOT 0.5 07/03/2015 1514       ASSESSMENT AND PLAN:  Anemia of chronic renal disease, Stage  IV History of hemeoccult positive stool History of iron deficiency, felt to be secondary to occult GI bleeding Chronic CHF Seizure d/o  I have increased her Aranesp to 100 g every 2 weeks. We will continue with biweekly CBCs and ongoing Aranesp therapy. She has never had a bone marrow biopsy but at this point realistically I do not feel it would offer [place] any information we could act upon. It certainly would not change any therapeutic options.   Her ferritin is pending today.  Should her ferritin be less than 100 we will arrange for IV iron replacement as indicated for patients with CKD on ESA therapy.  Will follow-up in 3 months with labs.  Continue current management.   Orders Placed This Encounter  Procedures  . ARANESP TREATMENT CONDITION    Hold Aranesp:  Renal hold for Hemoglobin greater than 11  . SCHEDULING COMMUNICATION INJECTION    Schedule injection appointment 15 min  . [place] COMMUNICATION LAB    Lab appointment 10 min.    All questions were answered. The patient knows to call the clinic with any problems, questions or concerns. We can certainly see the patient much sooner if necessary.  This document serves as a record of services personally performed by Ancil Linsey, MD. It was created on her behalf by Arlyce Harman, a trained medical scribe. The creation of this record is based on the scribe's personal observations and the provider's statements to them. This document has been checked and approved by the attending provider.  I have reviewed the above documentation for accuracy and completeness, and I agree with the above.  Molli Hazard, MD  07/16/2015

## 2015-07-16 NOTE — Patient Instructions (Addendum)
..  [place] at [facility] Discharge Instructions  RECOMMENDATIONS MADE BY THE CONSULTANT AND ANY TEST RESULTS WILL BE SENT TO YOUR REFERRING PHYSICIAN.  Exam per Dr. Whitney Muse  Aranesp 100 mcg subcutaneous today and every 2 weeks  1 month to see the Dr.     Lujean Rave you for choosing [place] at [facility] to provide your oncology and hematology care.  To afford each patient quality time with our provider, please arrive at least 15 minutes before your scheduled appointment time.    You need to re-schedule your appointment should you arrive 10 or more minutes late.  We strive to give you quality time with our providers, and arriving late affects you and other patients whose appointments are after yours.  Also, if you no show three or more times for appointments you may be dismissed from the clinic at the providers discretion.     Again, thank you for choosing [facility].  Our hope is that these requests will decrease the amount of time that you wait before being seen by our physicians.       _____________________________________________________________  Should you have questions after your visit to [facility], please contact our office at [phone] between the hours of 8:30 a.m. and 4:30 p.m.  Voicemails left after 4:30 p.m. will not be returned until the following business day.  For prescription refill requests, have your pharmacy contact our office.   ...dia

## 2015-07-17 ENCOUNTER — Ambulatory Visit: Payer: Self-pay | Admitting: *Deleted

## 2015-07-17 NOTE — Progress Notes (Signed)
Labs drawn

## 2015-07-18 ENCOUNTER — Other Ambulatory Visit: Payer: Self-pay | Admitting: *Deleted

## 2015-07-18 ENCOUNTER — Encounter: Payer: Self-pay | Admitting: *Deleted

## 2015-07-18 NOTE — Patient Outreach (Signed)
[facility]) Care Management   07/18/2015  [name] 1941-08-22 [id number]  [name] is an 74 y.o. female  Subjective:  Patient reporting she is doing pretty good, denies pain. Daughter reports weight still 146, Primary care restarted torsemide. Patient still getting [place] services, [name] has call into the Doctor around labs she is to draw and also if they wanted patient to restart potassium with torsemide.   Upcoming appointment next week with kidney dr, then follow up appointment with Dr. Harl Bowie in September. Patient also continues to go to Cancer center for anemia treatments.  Patient has cough and congestion, lungs clear, MD wants patient to use mucinex to help clear up congestion  Objective:   Patient neatly groomed and dressed, alert, oriented x 2. Patient has cough, lungs are clear. Patient wearing oxygen via nasal cannula, set on 2lpm  BP 136/80 mmHg  Pulse 64  Resp 20  Wt 146 lb (66.225 kg)  SpO2 94% Review of Systems  Constitutional: Negative.   HENT: Negative.   Eyes: Negative.   Respiratory: Positive for cough and sputum production.   Cardiovascular: Negative.   Gastrointestinal: Negative.   Genitourinary: Negative.   Musculoskeletal: Negative.   Skin: Negative.   Endo/Heme/Allergies: Negative.     Physical Exam  Constitutional: She appears well-developed.  Cardiovascular: Normal rate.   GI: Soft.  Neurological: She is alert.  Skin: Skin is warm.  Psychiatric: She has a normal mood and affect.    Current Medications:   Current Outpatient Prescriptions  Medication Sig Dispense Refill  . calcitRIOL (ROCALTROL) 0.25 MCG capsule Take 1 capsule (0.25 mcg total) by mouth daily. 31 capsule 0  . donepezil (ARICEPT) 10 MG tablet Take 10 mg by mouth at bedtime.     . folic acid (FOLVITE) 1 MG tablet Take 1 mg by mouth every morning.     . hydrALAZINE (APRESOLINE) 25 MG tablet Take 75 mg by mouth every 8 (eight) hours.     . insulin  detemir (LEVEMIR) 100 UNIT/ML injection Inject 0.04 mLs (4 Units total) into the skin at bedtime.    . isosorbide mononitrate (IMDUR) 30 MG 24 hr tablet Take 0.5 tablets (15 mg total) by mouth every morning. 30 tablet 0  . levETIRAcetam (KEPPRA) 100 MG/ML solution Take 500 mg by mouth 2 (two) times daily.     Marland Kitchen linagliptin (TRADJENTA) 5 MG TABS tablet Take 5 mg by mouth every morning.     . Maltodextrin-Xanthan Gum (RESOURCE THICKENUP CLEAR) POWD Take 120 g by mouth as needed. (Patient taking differently: Take 1 Can by mouth daily. )    . metoprolol succinate (TOPROL-XL) 100 MG 24 hr tablet Take 50 mg by mouth daily. Take with or immediately following a meal.    . mupirocin ointment (BACTROBAN) 2 % Apply 1 application topically 2 (two) times daily.     . pantoprazole (PROTONIX) 40 MG tablet Take 40 mg by mouth every morning.    . pravastatin (PRAVACHOL) 20 MG tablet Take 20 mg by mouth at bedtime.     . torsemide (DEMADEX) 20 MG tablet Take 20 mg by mouth daily.     No current facility-administered medications for this visit.    Functional Status:   In your present state of health, do you have any difficulty performing the following activities: 07/13/2015 07/03/2015  Hearing? - N  Vision? - N  Difficulty concentrating or making decisions? - Y  Walking or climbing stairs? - Y  Dressing or  bathing? - Y  Doing errands, shopping? N Y  Conservation officer, nature and eating ? - -  Using the Toilet? - -  In the past six months, have you accidently leaked urine? - -  Do you have problems with loss of bowel control? - -  Managing your Medications? - -  Managing your Finances? - -  Housekeeping or managing your Housekeeping? - -    Fall/Depression Screening:    PHQ 2/9 Scores 03/22/2015 10/16/2014 09/26/2014  PHQ - 2 Score 0 0 0    Assessment:   Education around medications Reviewed medications, patient taking Robitussin with mucinex Education around reporting symptoms to MD Reviewed questions to ask MD  at next weeks appointment  Plan:  Daughter to call MD if weight does not go down with addition of torsemide and if symptoms worsen Daughter to review medications and lab work with nephrologist RNCM will continue transition of care calls RNCM will visit next month  [name], RN, BSN, [place] [phone]  [facility] CM Care Plan Problem One        Most Recent Value   Care Plan Problem One  readmission for HF   Role Documenting the Problem One  Care Management Coordinator   Care Plan for Problem One  Active   [facility] Long Term Goal (31-90 days)  Patient will not be readmitted for HF in next 31 days   THN Long Term Goal Start Date  07/13/15   Interventions for Problem One Long Term Goal  utilizing teachback method reviewed importance of daily wts, monitoring for symptoms of fluid overload and calling provider    [facility] CM Care Plan Problem Two        Most Recent Value   Care Plan Problem Two  Readmission to hospital,pt admitted for HF   Role Documenting the Problem Two  Care Management [place] for Problem Two  Active   Interventions for Problem Two Long Term Goal   Patient in [facility] program will calls and visits weekly for 4 weeks, will emphasize HF zones with caregivers   THN Long Term Goal (31-90) days  Patient will not be readmitted for HF in the next 30 days   [facility]N Long Term Goal Start Date  07/13/15   [facility] CM Short Term Goal #1 (0-30 days)  Patient and daughter will have formulated questions about dialyis before next appointment with kidney doctor   [facility] CM Short Term Goal #1 Start Date  07/18/15 Billy Fischer for upcoming appointment on 8/31]   Interventions for Short Term Goal #2   education around medications and kidney function given to daughter   [facility] CM Short Term Goal #2 (0-30 days)  Patient and family will have knowledge of impact of dialysis to make informed decision   [facility] CM Short Term Goal #2 Start Date  05/22/15   Interventions for Short Term  Goal #2  using teachback method, discussed upcoming appointment with Kidney MD and questions for patient family to ask, main concern is around medication questions, fluid pill and k+

## 2015-07-19 ENCOUNTER — Encounter (HOSPITAL_COMMUNITY): Payer: Medicare Other

## 2015-07-23 ENCOUNTER — Other Ambulatory Visit: Payer: Self-pay | Admitting: *Deleted

## 2015-07-23 ENCOUNTER — Other Ambulatory Visit (HOSPITAL_COMMUNITY)
Admission: RE | Admit: 2015-07-23 | Discharge: 2015-07-23 | Disposition: A | Discharge status: Home / Self Care | Payer: Medicare Other | Source: Other Acute Inpatient Hospital | Attending: Nephrology | Admitting: Nephrology

## 2015-07-23 DIAGNOSIS — I5042 Chronic combined systolic (congestive) and diastolic (congestive) heart failure: Secondary | ICD-10-CM | POA: Diagnosis not present

## 2015-07-23 DIAGNOSIS — E1122 Type 2 diabetes mellitus with diabetic chronic kidney disease: Secondary | ICD-10-CM | POA: Insufficient documentation

## 2015-07-23 DIAGNOSIS — I120 Hypertensive chronic kidney disease with stage 5 chronic kidney disease or end stage renal disease: Secondary | ICD-10-CM | POA: Diagnosis present

## 2015-07-23 DIAGNOSIS — N185 Chronic kidney disease, stage 5: Secondary | ICD-10-CM | POA: Insufficient documentation

## 2015-07-23 LAB — IRON AND TIBC
IRON: 26 ug/dL — AB (ref 28–170)
Saturation Ratios: 10 % — ABNORMAL LOW (ref 10.4–31.8)
TIBC: 259 ug/dL (ref 250–450)
UIBC: 233 ug/dL

## 2015-07-23 LAB — HEMOGLOBIN AND HEMATOCRIT, BLOOD
HEMATOCRIT: 26.9 % — AB (ref 36.0–46.0)
Hemoglobin: 8.3 g/dL — ABNORMAL LOW (ref 12.0–15.0)

## 2015-07-23 LAB — RENAL FUNCTION PANEL
ALBUMIN: 3.1 g/dL — AB (ref 3.5–5.0)
ANION GAP: 6 (ref 5–15)
BUN: 70 mg/dL — ABNORMAL HIGH (ref 6–20)
CALCIUM: 8.3 mg/dL — AB (ref 8.9–10.3)
CO2: 24 mmol/L (ref 22–32)
CREATININE: 3.73 mg/dL — AB (ref 0.44–1.00)
Chloride: 112 mmol/L — ABNORMAL HIGH (ref 101–111)
GFR calc non Af Amer: 11 mL/min — ABNORMAL LOW (ref >60)
GFR, EST AFRICAN AMERICAN: 13 mL/min — AB (ref >60)
GLUCOSE: 77 mg/dL (ref 65–99)
PHOSPHORUS: 3.8 mg/dL (ref 2.5–4.6)
Potassium: 3.5 mmol/L (ref 3.5–5.1)
SODIUM: 142 mmol/L (ref 135–145)

## 2015-07-23 LAB — PROTEIN / CREATININE RATIO, URINE
Creatinine, Urine: 106.84 mg/dL
PROTEIN CREATININE RATIO: 1.17 mg/mg{creat} — AB (ref 0.00–0.15)
TOTAL PROTEIN, URINE: 125 mg/dL

## 2015-07-23 LAB — FERRITIN: Ferritin: 248 ng/mL (ref 11–307)

## 2015-07-23 LAB — VITAMIN B12: VITAMIN B 12: 736 pg/mL (ref 180–914)

## 2015-07-23 LAB — FOLATE

## 2015-07-23 NOTE — Patient Outreach (Signed)
TOC week #3 outreach call attempted Call to patient daughter [name]. Unable to reach, unable to leave a message. Plan to attempt again later. [name]. [name], RN, BSN, [place] [phone]

## 2015-07-24 ENCOUNTER — Other Ambulatory Visit: Payer: Self-pay | Admitting: *Deleted

## 2015-07-24 ENCOUNTER — Other Ambulatory Visit (HOSPITAL_COMMUNITY): Payer: Self-pay

## 2015-07-24 LAB — PARATHYROID HORMONE, INTACT (NO CA): PTH: 130 pg/mL — ABNORMAL HIGH (ref 15–65)

## 2015-07-24 LAB — VITAMIN D 25 HYDROXY (VIT D DEFICIENCY, FRACTURES): Vit D, 25-Hydroxy: 28.7 ng/mL — ABNORMAL LOW (ref 30.0–100.0)

## 2015-07-24 NOTE — Patient Outreach (Signed)
TOC week #3  Call to patient home, spoke with daughter, [name].  She reports patient is doing better, her weight is down to 143, she is still taking the torsemide, she is not taking any potassium. Home care  nurse drew labs for Dr. Eliseo Gum (kidney specialist), included in the labs was potassium level, patient appointment scheduled for tomorrow. Daughter reports cough has improved also, and the Home care nurse said lungs were clear. Daughter verbalizes to call Primary care MD if cough/congstion symptoms worsen and to call Cardiologist if weights go up and she has symptoms of HF.  Plan to continue to make TOC calls and conitnue [facility] services.  Outreach visit scheduled for September. [name]. [name], RN, BSN, [place] [phone]

## 2015-07-27 ENCOUNTER — Telehealth: Payer: Self-pay | Admitting: Cardiology

## 2015-07-27 NOTE — Telephone Encounter (Signed)
pls call Sarah w/ [facility] concerning this pt

## 2015-07-27 NOTE — Telephone Encounter (Signed)
Called Sarah about Mrs. Maggi. She states that the PT is retaining fluid and you can hear it in her chest. [name] states that Dr. Berdine Addison saw her on Wed. And increased her lasix to 40 mg daily for 3 days, then back down to 20 mg. [name] did a mobile chest x-ray and it showed some problems, which stated to follow up with her Dr. On what they should do next. [name] can be reached at [phone]

## 2015-07-27 NOTE — Telephone Encounter (Signed)
LMTCB at 4:45 pm-cc

## 2015-07-31 ENCOUNTER — Encounter (HOSPITAL_BASED_OUTPATIENT_CLINIC_OR_DEPARTMENT_OTHER): Payer: Medicare Other

## 2015-07-31 ENCOUNTER — Other Ambulatory Visit: Payer: Self-pay | Admitting: *Deleted

## 2015-07-31 ENCOUNTER — Encounter (HOSPITAL_COMMUNITY): Payer: Medicare Other | Attending: Hematology and Oncology

## 2015-07-31 VITALS — BP 160/71 | HR 67 | Temp 98.5°F | Resp 18

## 2015-07-31 DIAGNOSIS — D509 Iron deficiency anemia, unspecified: Secondary | ICD-10-CM

## 2015-07-31 DIAGNOSIS — E780 Pure hypercholesterolemia: Secondary | ICD-10-CM | POA: Insufficient documentation

## 2015-07-31 DIAGNOSIS — Z951 Presence of aortocoronary bypass graft: Secondary | ICD-10-CM | POA: Insufficient documentation

## 2015-07-31 DIAGNOSIS — I129 Hypertensive chronic kidney disease with stage 1 through stage 4 chronic kidney disease, or unspecified chronic kidney disease: Secondary | ICD-10-CM | POA: Diagnosis not present

## 2015-07-31 DIAGNOSIS — D631 Anemia in chronic kidney disease: Secondary | ICD-10-CM

## 2015-07-31 DIAGNOSIS — I252 Old myocardial infarction: Secondary | ICD-10-CM | POA: Insufficient documentation

## 2015-07-31 DIAGNOSIS — R569 Unspecified convulsions: Secondary | ICD-10-CM | POA: Insufficient documentation

## 2015-07-31 DIAGNOSIS — I509 Heart failure, unspecified: Secondary | ICD-10-CM | POA: Diagnosis not present

## 2015-07-31 DIAGNOSIS — D649 Anemia, unspecified: Secondary | ICD-10-CM | POA: Diagnosis present

## 2015-07-31 DIAGNOSIS — I2510 Atherosclerotic heart disease of native coronary artery without angina pectoris: Secondary | ICD-10-CM | POA: Insufficient documentation

## 2015-07-31 DIAGNOSIS — Z8673 Personal history of transient ischemic attack (TIA), and cerebral infarction without residual deficits: Secondary | ICD-10-CM | POA: Insufficient documentation

## 2015-07-31 DIAGNOSIS — N184 Chronic kidney disease, stage 4 (severe): Secondary | ICD-10-CM | POA: Insufficient documentation

## 2015-07-31 DIAGNOSIS — Z794 Long term (current) use of insulin: Secondary | ICD-10-CM | POA: Diagnosis not present

## 2015-07-31 DIAGNOSIS — F0390 Unspecified dementia without behavioral disturbance: Secondary | ICD-10-CM | POA: Diagnosis not present

## 2015-07-31 DIAGNOSIS — Z87891 Personal history of nicotine dependence: Secondary | ICD-10-CM | POA: Insufficient documentation

## 2015-07-31 DIAGNOSIS — D62 Acute posthemorrhagic anemia: Secondary | ICD-10-CM

## 2015-07-31 DIAGNOSIS — E119 Type 2 diabetes mellitus without complications: Secondary | ICD-10-CM | POA: Diagnosis not present

## 2015-07-31 DIAGNOSIS — Z87898 Personal history of other specified conditions: Secondary | ICD-10-CM | POA: Insufficient documentation

## 2015-07-31 DIAGNOSIS — Z79899 Other long term (current) drug therapy: Secondary | ICD-10-CM | POA: Insufficient documentation

## 2015-07-31 LAB — CBC WITH DIFFERENTIAL/PLATELET
Basophils Absolute: 0.1 10*3/uL (ref 0.0–0.1)
Basophils Relative: 1 % (ref 0–1)
Eosinophils Absolute: 0 10*3/uL (ref 0.0–0.7)
Eosinophils Relative: 1 % (ref 0–5)
HEMATOCRIT: 29.7 % — AB (ref 36.0–46.0)
HEMOGLOBIN: 9.1 g/dL — AB (ref 12.0–15.0)
LYMPHS ABS: 0.9 10*3/uL (ref 0.7–4.0)
LYMPHS PCT: 18 % (ref 12–46)
MCH: 27.2 pg (ref 26.0–34.0)
MCHC: 30.6 g/dL (ref 30.0–36.0)
MCV: 88.9 fL (ref 78.0–100.0)
MONOS PCT: 12 % (ref 3–12)
Monocytes Absolute: 0.6 10*3/uL (ref 0.1–1.0)
NEUTROS ABS: 3.5 10*3/uL (ref 1.7–7.7)
NEUTROS PCT: 68 % (ref 43–77)
Platelets: 283 10*3/uL (ref 150–400)
RBC: 3.34 MIL/uL — ABNORMAL LOW (ref 3.87–5.11)
RDW: 17.9 % — ABNORMAL HIGH (ref 11.5–15.5)
WBC: 5.1 10*3/uL (ref 4.0–10.5)

## 2015-07-31 MED ORDER — DARBEPOETIN ALFA 100 MCG/0.5ML IJ SOSY
100.0000 ug | PREFILLED_SYRINGE | Freq: Once | INTRAMUSCULAR | Status: AC
  Administered 2015-07-31: 100 ug via SUBCUTANEOUS
  Filled 2015-07-31: qty 0.5

## 2015-07-31 NOTE — Telephone Encounter (Signed)
Pt not on Lasix,on Demadex,will message Dr Harl Bowie.Apt made for this Thursday

## 2015-07-31 NOTE — Progress Notes (Signed)
[name] presents today for injection per MD orders. Aranesp 100 mcg administered SQ in left Abdomen. Administration without incident. Patient tolerated well.

## 2015-07-31 NOTE — Telephone Encounter (Signed)
Have her go back to lasix 40mg  daily and put her in my Thursday 10AM slot please  J Bindi Klomp MD

## 2015-07-31 NOTE — Patient Instructions (Signed)
[place] at [facility] Discharge Instructions  RECOMMENDATIONS MADE BY THE CONSULTANT AND ANY TEST RESULTS WILL BE SENT TO YOUR REFERRING PHYSICIAN.  Hemoglobin 9.1 today. Aranesp 100 mcg injection given as ordered. Return as scheduled.  Thank you for choosing [place] at [facility] to provide your oncology and hematology care.  To afford each patient quality time with our provider, please arrive at least 15 minutes before your scheduled appointment time.    You need to re-schedule your appointment should you arrive 10 or more minutes late.  We strive to give you quality time with our providers, and arriving late affects you and other patients whose appointments are after yours.  Also, if you no show three or more times for appointments you may be dismissed from the clinic at the providers discretion.     Again, thank you for choosing [facility]  Our hope is that these requests will decrease the amount of time that you wait before being seen by our physicians.       _____________________________________________________________  Should you have questions after your visit to [facility], please contact our office at [phone] between the hours of 8:30 a.m. and 4:30 p.m.  Voicemails left after 4:30 p.m. will not be returned until the following business day.  For prescription refill requests, have your pharmacy contact our office.

## 2015-07-31 NOTE — Patient Outreach (Signed)
TOC week #4 Call to Patient daughter  Patient still has some fluid built up, Cardiologist moved patient appointment up to this Thursday. Patient now alternating torsemide 2 one day 1 the next, also they may change medication from Torsemide to Lasix. Patient did see nephrologist, no changes.  Daughter states patient weight has been 140-142#  Daughter aware to call MD as needed for advice on weight and medications.  Assessment: No new needs or concerns for [place] working with patient and family   Plan  RNCM to continue outreach visits. Daughter will f/u with MD or home health regarding medications Daughter will call RNCM with any new questions or concerns. [name]. [name], RN, BSN, [place] [phone]

## 2015-08-01 NOTE — Progress Notes (Signed)
Labs drawn

## 2015-08-02 ENCOUNTER — Encounter: Payer: Self-pay | Admitting: *Deleted

## 2015-08-02 ENCOUNTER — Encounter: Payer: Self-pay | Admitting: Cardiology

## 2015-08-02 ENCOUNTER — Ambulatory Visit (INDEPENDENT_AMBULATORY_CARE_PROVIDER_SITE_OTHER): Payer: Medicare Other | Admitting: Cardiology

## 2015-08-02 VITALS — BP 151/74 | HR 64 | Ht 64.0 in | Wt 143.0 lb

## 2015-08-02 DIAGNOSIS — I2510 Atherosclerotic heart disease of native coronary artery without angina pectoris: Secondary | ICD-10-CM

## 2015-08-02 DIAGNOSIS — I5032 Chronic diastolic (congestive) heart failure: Secondary | ICD-10-CM | POA: Diagnosis not present

## 2015-08-02 DIAGNOSIS — I340 Nonrheumatic mitral (valve) insufficiency: Secondary | ICD-10-CM

## 2015-08-02 MED ORDER — ALBUTEROL SULFATE HFA 108 (90 BASE) MCG/ACT IN AERS: 2.0000 | INHALATION_SPRAY | Freq: Four times a day (QID) | RESPIRATORY_TRACT | Status: AC | PRN

## 2015-08-02 NOTE — Patient Instructions (Signed)
Your physician recommends that you schedule a follow-up appointment in: 3-4 weeks with Dr. Harl Bowie  Your physician has recommended you make the following change in your medication:   START ALBUTEROL INHALER 2 PUFFS EVERY 6 HOURS AS NEEDED  Your physician recommends that you return for lab work in: [place] BMP/MG  Your physician has requested that you regularly monitor and record your blood pressure readings at home FOR 1 [place] VISIT. Please use the same machine at the same time of day to check your readings and record them to bring to your follow-up visit.  Thank you for choosing [place]!!

## 2015-08-02 NOTE — Progress Notes (Signed)
Patient ID: [name], female   DOB: 1941-04-24, 74 y.o.   MRN: [id number]     Clinical Summary [name] is a 74 y.o.female seen today for follow up of the following medical problems. This is a focused visit on her history of chronic diastolic heart failrue.   1. Chronic systolic/diastolic heart failure  - echo 06/2015 LVEF 55-60%,  Grade II diastolic dysfunction, severe MR, mod to severe RV dysfunction - compliant with meds. No ACE-I due to poor renal function.   - fluid status has been up and down over the last several months, with admits for both hypovolemia and AKI as well as decompensated heart failure. Weight as low as 111 lbs during admit with hypovolemia. Her weight at our last visit in early 06/2015 was 123 lbs. Most recent discharge 06/2015 weight 145 lbs. Home weights stable 140-142 lbs since then.  - last labs 07/23/15 Cr 3.73, BUN 70. Fairly labile over the last few weeks 3.3 to 4.9. As high as 6 2 months ago.  - family reports increased DOE for the patient, home health has noted worsening LE edema as well.  - currently on toresmide 40mg  alternative with 20mg  daily.    2. Severe MR -  poor candidate at this time for consideration for surgery due to overall comorbidities including dementia.  - labile fluid status as described above.   Past Medical History  Diagnosis Date  . Type 2 diabetes mellitus   . Essential hypertension, benign   . History of stroke     Previously on Coumadin  . MI, old     Reported 74  . Seizure disorder   . Coronary atherosclerosis of native coronary artery     Multivessel status post CABG 2002  . History of GI bleed     Erosive gastritis and duodenitis by EGD 2002  . Mixed hyperlipidemia   . Ischemic cardiomyopathy     LVEF 40-45% March 2013  . Dementia   . CKD (chronic kidney disease) stage 4, GFR 15-29 ml/min   . CHF (congestive heart failure)   . Chronic respiratory failure with hypoxia   . Stroke   . Seizures   . Anemia of  chronic renal failure, stage 4 (severe) 01/02/2015     No Known Allergies   Current Outpatient Prescriptions  Medication Sig Dispense Refill  . calcitRIOL (ROCALTROL) 0.25 MCG capsule Take 1 capsule (0.25 mcg total) by mouth daily. 31 capsule 0  . donepezil (ARICEPT) 10 MG tablet Take 10 mg by mouth at bedtime.     . folic acid (FOLVITE) 1 MG tablet Take 1 mg by mouth every morning.     . hydrALAZINE (APRESOLINE) 25 MG tablet Take 75 mg by mouth every 8 (eight) hours.     . insulin detemir (LEVEMIR) 100 UNIT/ML injection Inject 0.04 mLs (4 Units total) into the skin at bedtime.    . isosorbide mononitrate (IMDUR) 30 MG 24 hr tablet Take 0.5 tablets (15 mg total) by mouth every morning. 30 tablet 0  . levETIRAcetam (KEPPRA) 100 MG/ML solution Take 500 mg by mouth 2 (two) times daily.     Marland Kitchen linagliptin (TRADJENTA) 5 MG TABS tablet Take 5 mg by mouth every morning.     . Maltodextrin-Xanthan Gum (RESOURCE THICKENUP CLEAR) POWD Take 120 g by mouth as needed. (Patient taking differently: Take 1 Can by mouth daily. )    . metoprolol succinate (TOPROL-XL) 100 MG 24 hr tablet Take 50 mg by mouth daily.  Take with or immediately following a meal.    . mupirocin ointment (BACTROBAN) 2 % Apply 1 application topically 2 (two) times daily.     . pantoprazole (PROTONIX) 40 MG tablet Take 40 mg by mouth every morning.    . pravastatin (PRAVACHOL) 20 MG tablet Take 20 mg by mouth at bedtime.     . torsemide (DEMADEX) 20 MG tablet Take 20 mg by mouth daily.     No current facility-administered medications for this visit.     Past Surgical History  Procedure Laterality Date  . Coronary artery bypass graft  July 2002    LIMA to LAD, SVG to OM1, SVG to OM 2, SVG to RCA  . Tee without cardioversion  01/28/2012    Procedure: TRANSESOPHAGEAL ECHOCARDIOGRAM (TEE);  Surgeon: Lelon Perla, MD;  Location: [facility] ENDOSCOPY;  Service: Cardiovascular;  Laterality: [name];  . Colonoscopy  2002  .  Esophagogastroduodenoscopy  2002  . Esophagogastroduodenoscopy [name] 01/18/2014    EQA:STMHD hiatal hernia. Abnormal gastric and duodenal bulbar mucosa of uncertain significance - status post gastric bx (chronic gastritis/H.pylori +  . Colonoscopy [name] 06/25/2014    Dr. Laural Golden: tubular adenoma, pandiverticulosis.   . Agile capsule [name] 07/06/2014    Procedure: AGILE CAPSULE;  Surgeon: Danie Binder, MD;  Location: [facility] ENDO SUITE;  Service: Endoscopy;  Laterality: [name];  730  . Esophagogastroduodenoscopy [name] 07/20/2014    Dr. Gala Romney: tiny gastric polyps not manipulated. minimal pigmentation of duodenal bulb likely not significant  . [place] capsule study [name] 07/20/2014    incomplete     No Known Allergies    Family History  Problem Relation Age of Onset  . Stroke Father   . Diabetes type II Mother   . Colon cancer Neg Hx      Social History [name] reports that she quit smoking about 3 years ago. Her smoking use included Cigarettes. She started smoking about 54 years ago. She has a 50 pack-year smoking history. She has never used smokeless tobacco. [name] reports that she does not drink alcohol.   Review of Systems CONSTITUTIONAL: No weight loss, fever, chills, weakness or fatigue.  HEENT: Eyes: No visual loss, blurred vision, double vision or yellow sclerae.No hearing loss, sneezing, congestion, runny nose or sore throat.  SKIN: No rash or itching.  CARDIOVASCULAR: no chest pain, no palpitaotns RESPIRATORY: No cough or sputum.  GASTROINTESTINAL: No anorexia, nausea, vomiting or diarrhea. No abdominal pain or blood.  GENITOURINARY: No burning on urination, no polyuria NEUROLOGICAL: No headache, dizziness, syncope, paralysis, ataxia, numbness or tingling in the extremities. No change in bowel or bladder control.  MUSCULOSKELETAL: No muscle, back pain, joint pain or stiffness.  LYMPHATICS: No enlarged nodes. No history of splenectomy.  PSYCHIATRIC: No history of depression or  anxiety.  ENDOCRINOLOGIC: No reports of sweating, cold or heat intolerance. No polyuria or polydipsia.  Marland Kitchen   Physical Examination Filed Vitals:   08/02/15 1525  BP: 151/74  Pulse: 64   Filed Vitals:   08/02/15 1525  Height: 5\' 4"  (1.626 m)  Weight: 143 lb (64.864 kg)    Gen: resting comfortably, no acute distress HEENT: no scleral icterus, pupils equal round and reactive, no palptable cervical adenopathy,  CV: RRR, 3/6 systolic murmur at apex Resp: bilateral expriatory wheeze GI: abdomen is soft, non-tender, non-distended, normal bowel sounds, no hepatosplenomegaly MSK: extremities are warm, 1+ bilateral LE edema Skin: warm, no rash Neuro:  no focal deficits Psych: appropriate affect   Diagnostic Studies  01/17/14 Echo  LVEF 40-45%, mod LVH, grade I diastolic dysfunction, 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 Wall thickness was increased in a pattern of moderate LVH. Systolic function was normal. The estimated ejection fraction was in the range of 55% to 60%. Features are consistent with a pseudonormal left ventricular filling pattern, with concomitant abnormal relaxation and increased filling pressure (grade 2 diastolic dysfunction). Doppler parameters are consistent with high ventricular filling pressure. - Regional wall motion abnormality: Akinesis and scarring of the basal inferior and basal-mid inferolateral myocardium; severe hypokinesis and scarring of the mid anterolateral myocardium; severe hypokinesis of the mid inferior myocardium. - Aortic valve: Mildly calcified annulus. Mildly thickened leaflets. - Mitral valve: There is mildly restricted mobility of the posterior leaflet due to scarring of the basal inferolateral wall. Moderately thickened anterior and mildly thickened posterior leaflets. Severe eccentric  regurgitation. Eccentricity may lead to an underestimation of true severity. Vena contracta 0.4 cm. - Left atrium: The atrium was moderately dilated. - Right ventricle: The cavity size was mildly to moderately dilated. Systolic function was moderately to severely reduced. - Right atrium: The atrium was moderately dilated. - Atrial septum: No defect or patent foramen ovale was identified. - Tricuspid valve: Mildly thickened leaflets. There was mild-moderate regurgitation. - Pulmonic valve: Mildly thickened leaflets. There was mild regurgitation. - Pulmonary arteries: Systolic pressure was moderately increased. PA peak pressure: 52 mm Hg (S). - Inferior vena cava: The vessel was dilated. The respirophasic diameter changes were blunted (< 50%), consistent with elevated central venous pressure.   Assessment and Plan  1. Chronic diastolic heart failure - very labile volume status with admits for both hypovolemia and volume overload over the last few months - management complicated by severe renal dysfunction as well as severe MR - currently on torsemide 40mg  alternative with 20mg  daily, weights overalls stable though does have some signs of fluid overload. Due to renal insufficiency would not be more aggressive with diuretics.  - repeat BMET and Mg levels.  2. Mitral regurgitation - poor candidate for intervention, continue symptoms management with diuretics  3.SOB - CHF as described above. Also with expiratory wheezing on exam with long tobacco history, will start prn albuterol. - consider PFTs, though with her dementia not sure she would be able to complete   Arnoldo Lenis, M.D.

## 2015-08-07 ENCOUNTER — Other Ambulatory Visit (HOSPITAL_COMMUNITY)
Admission: AD | Admit: 2015-08-07 | Discharge: 2015-08-07 | Disposition: A | Discharge status: Home / Self Care | Payer: Medicare Other | Source: Other Acute Inpatient Hospital | Attending: Cardiology | Admitting: Cardiology

## 2015-08-07 ENCOUNTER — Encounter: Payer: Self-pay | Admitting: Cardiology

## 2015-08-07 DIAGNOSIS — I5043 Acute on chronic combined systolic (congestive) and diastolic (congestive) heart failure: Secondary | ICD-10-CM | POA: Diagnosis present

## 2015-08-07 LAB — MAGNESIUM: Magnesium: 2.2 mg/dL (ref 1.7–2.4)

## 2015-08-07 LAB — BASIC METABOLIC PANEL
Anion gap: 5 (ref 5–15)
BUN: 94 mg/dL — ABNORMAL HIGH (ref 6–20)
CALCIUM: 8.4 mg/dL — AB (ref 8.9–10.3)
CHLORIDE: 119 mmol/L — AB (ref 101–111)
CO2: 18 mmol/L — AB (ref 22–32)
CREATININE: 4.66 mg/dL — AB (ref 0.44–1.00)
GFR calc non Af Amer: 8 mL/min — ABNORMAL LOW (ref >60)
GFR, EST AFRICAN AMERICAN: 10 mL/min — AB (ref >60)
GLUCOSE: 106 mg/dL — AB (ref 65–99)
Potassium: 5 mmol/L (ref 3.5–5.1)
Sodium: 142 mmol/L (ref 135–145)

## 2015-08-08 ENCOUNTER — Encounter (HOSPITAL_COMMUNITY)
Admission: RE | Admit: 2015-08-08 | Discharge: 2015-08-08 | Disposition: A | Discharge status: Home / Self Care | Payer: Medicare Other | Source: Ambulatory Visit | Attending: Nephrology | Admitting: Nephrology

## 2015-08-08 DIAGNOSIS — D509 Iron deficiency anemia, unspecified: Secondary | ICD-10-CM | POA: Diagnosis present

## 2015-08-08 DIAGNOSIS — N184 Chronic kidney disease, stage 4 (severe): Secondary | ICD-10-CM | POA: Diagnosis not present

## 2015-08-08 MED ORDER — SODIUM CHLORIDE 0.9 % IV SOLN: Freq: Once | INTRAVENOUS | Status: DC

## 2015-08-08 MED ORDER — FERUMOXYTOL INJECTION 510 MG/17 ML
510.0000 mg | INTRAVENOUS | Status: DC
  Administered 2015-08-08: 510 mg via INTRAVENOUS
  Filled 2015-08-08: qty 17

## 2015-08-09 ENCOUNTER — Telehealth: Payer: Self-pay | Admitting: Cardiology

## 2015-08-09 NOTE — Telephone Encounter (Signed)
[name], nurse w/ [facility] is calling to give [name]'s BP readings, she states these are readings from the nurses that have came out this week, that [name] does not have a cuff at her home  08/03/15- 144/60 HR 60,  08/07/15- 126/68 HR 62,  Today 08/09/15- 130/68 HR 66

## 2015-08-09 NOTE — Telephone Encounter (Signed)
Blood pressures look good.    J branch MD

## 2015-08-09 NOTE — Telephone Encounter (Signed)
Will forward to Dr. Branch. 

## 2015-08-10 ENCOUNTER — Telehealth: Payer: Self-pay

## 2015-08-10 DIAGNOSIS — Z79899 Other long term (current) drug therapy: Secondary | ICD-10-CM

## 2015-08-10 NOTE — Telephone Encounter (Signed)
Spoke to PT daughter, explained her test results- and medication change. She voiced understanding. Will have [facility] come out and so her BMET in 1 week.

## 2015-08-13 ENCOUNTER — Encounter (HOSPITAL_COMMUNITY): Payer: Medicare Other | Admitting: Oncology

## 2015-08-13 ENCOUNTER — Emergency Department (HOSPITAL_COMMUNITY): Payer: Medicare Other

## 2015-08-13 ENCOUNTER — Encounter (HOSPITAL_COMMUNITY): Payer: Medicare Other

## 2015-08-13 ENCOUNTER — Encounter (HOSPITAL_COMMUNITY): Payer: Self-pay

## 2015-08-13 ENCOUNTER — Other Ambulatory Visit: Payer: Self-pay

## 2015-08-13 ENCOUNTER — Inpatient Hospital Stay (HOSPITAL_COMMUNITY)
Admission: EM | Admit: 2015-08-13 | Discharge: 2015-08-20 | DRG: 177 | Disposition: A | Discharge status: Home Health | Payer: Medicare Other | Attending: Internal Medicine | Admitting: Internal Medicine

## 2015-08-13 DIAGNOSIS — E782 Mixed hyperlipidemia: Secondary | ICD-10-CM | POA: Diagnosis present

## 2015-08-13 DIAGNOSIS — D62 Acute posthemorrhagic anemia: Secondary | ICD-10-CM

## 2015-08-13 DIAGNOSIS — I255 Ischemic cardiomyopathy: Secondary | ICD-10-CM | POA: Diagnosis present

## 2015-08-13 DIAGNOSIS — I129 Hypertensive chronic kidney disease with stage 1 through stage 4 chronic kidney disease, or unspecified chronic kidney disease: Secondary | ICD-10-CM | POA: Diagnosis present

## 2015-08-13 DIAGNOSIS — E119 Type 2 diabetes mellitus without complications: Secondary | ICD-10-CM

## 2015-08-13 DIAGNOSIS — I10 Essential (primary) hypertension: Secondary | ICD-10-CM | POA: Diagnosis present

## 2015-08-13 DIAGNOSIS — Z8673 Personal history of transient ischemic attack (TIA), and cerebral infarction without residual deficits: Secondary | ICD-10-CM | POA: Diagnosis not present

## 2015-08-13 DIAGNOSIS — N189 Chronic kidney disease, unspecified: Secondary | ICD-10-CM | POA: Diagnosis not present

## 2015-08-13 DIAGNOSIS — F0390 Unspecified dementia without behavioral disturbance: Secondary | ICD-10-CM | POA: Diagnosis present

## 2015-08-13 DIAGNOSIS — R0602 Shortness of breath: Secondary | ICD-10-CM

## 2015-08-13 DIAGNOSIS — G40909 Epilepsy, unspecified, not intractable, without status epilepticus: Secondary | ICD-10-CM | POA: Diagnosis present

## 2015-08-13 DIAGNOSIS — Z823 Family history of stroke: Secondary | ICD-10-CM

## 2015-08-13 DIAGNOSIS — N179 Acute kidney failure, unspecified: Secondary | ICD-10-CM | POA: Diagnosis present

## 2015-08-13 DIAGNOSIS — J449 Chronic obstructive pulmonary disease, unspecified: Secondary | ICD-10-CM | POA: Diagnosis present

## 2015-08-13 DIAGNOSIS — Z515 Encounter for palliative care: Secondary | ICD-10-CM | POA: Insufficient documentation

## 2015-08-13 DIAGNOSIS — E1122 Type 2 diabetes mellitus with diabetic chronic kidney disease: Secondary | ICD-10-CM | POA: Diagnosis present

## 2015-08-13 DIAGNOSIS — J189 Pneumonia, unspecified organism: Secondary | ICD-10-CM | POA: Diagnosis present

## 2015-08-13 DIAGNOSIS — E118 Type 2 diabetes mellitus with unspecified complications: Secondary | ICD-10-CM | POA: Diagnosis not present

## 2015-08-13 DIAGNOSIS — R32 Unspecified urinary incontinence: Secondary | ICD-10-CM | POA: Diagnosis present

## 2015-08-13 DIAGNOSIS — E875 Hyperkalemia: Secondary | ICD-10-CM | POA: Diagnosis present

## 2015-08-13 DIAGNOSIS — J690 Pneumonitis due to inhalation of food and vomit: Secondary | ICD-10-CM | POA: Diagnosis present

## 2015-08-13 DIAGNOSIS — Z66 Do not resuscitate: Secondary | ICD-10-CM | POA: Diagnosis present

## 2015-08-13 DIAGNOSIS — J9610 Chronic respiratory failure, unspecified whether with hypoxia or hypercapnia: Secondary | ICD-10-CM | POA: Diagnosis present

## 2015-08-13 DIAGNOSIS — E872 Acidosis: Secondary | ICD-10-CM | POA: Diagnosis present

## 2015-08-13 DIAGNOSIS — I2510 Atherosclerotic heart disease of native coronary artery without angina pectoris: Secondary | ICD-10-CM | POA: Diagnosis present

## 2015-08-13 DIAGNOSIS — Z951 Presence of aortocoronary bypass graft: Secondary | ICD-10-CM | POA: Diagnosis not present

## 2015-08-13 DIAGNOSIS — D509 Iron deficiency anemia, unspecified: Secondary | ICD-10-CM | POA: Diagnosis present

## 2015-08-13 DIAGNOSIS — D631 Anemia in chronic kidney disease: Secondary | ICD-10-CM | POA: Diagnosis present

## 2015-08-13 DIAGNOSIS — Z794 Long term (current) use of insulin: Secondary | ICD-10-CM

## 2015-08-13 DIAGNOSIS — N184 Chronic kidney disease, stage 4 (severe): Secondary | ICD-10-CM

## 2015-08-13 DIAGNOSIS — Z833 Family history of diabetes mellitus: Secondary | ICD-10-CM

## 2015-08-13 DIAGNOSIS — I252 Old myocardial infarction: Secondary | ICD-10-CM | POA: Diagnosis not present

## 2015-08-13 DIAGNOSIS — I5042 Chronic combined systolic (congestive) and diastolic (congestive) heart failure: Secondary | ICD-10-CM | POA: Diagnosis present

## 2015-08-13 DIAGNOSIS — R1314 Dysphagia, pharyngoesophageal phase: Secondary | ICD-10-CM | POA: Diagnosis present

## 2015-08-13 DIAGNOSIS — G9341 Metabolic encephalopathy: Secondary | ICD-10-CM | POA: Diagnosis present

## 2015-08-13 DIAGNOSIS — Z87891 Personal history of nicotine dependence: Secondary | ICD-10-CM

## 2015-08-13 DIAGNOSIS — E870 Hyperosmolality and hypernatremia: Secondary | ICD-10-CM | POA: Diagnosis present

## 2015-08-13 DIAGNOSIS — I340 Nonrheumatic mitral (valve) insufficiency: Secondary | ICD-10-CM | POA: Diagnosis present

## 2015-08-13 DIAGNOSIS — N289 Disorder of kidney and ureter, unspecified: Secondary | ICD-10-CM

## 2015-08-13 DIAGNOSIS — Z7189 Other specified counseling: Secondary | ICD-10-CM | POA: Diagnosis not present

## 2015-08-13 LAB — CBC WITH DIFFERENTIAL/PLATELET
BASOS ABS: 0 10*3/uL (ref 0.0–0.1)
BASOS PCT: 1 %
EOS ABS: 0 10*3/uL (ref 0.0–0.7)
Eosinophils Relative: 0 %
HCT: 32.9 % — ABNORMAL LOW (ref 36.0–46.0)
HEMOGLOBIN: 9.7 g/dL — AB (ref 12.0–15.0)
Lymphocytes Relative: 14 %
Lymphs Abs: 0.7 10*3/uL (ref 0.7–4.0)
MCH: 26.9 pg (ref 26.0–34.0)
MCHC: 29.5 g/dL — AB (ref 30.0–36.0)
MCV: 91.1 fL (ref 78.0–100.0)
MONOS PCT: 11 %
Monocytes Absolute: 0.5 10*3/uL (ref 0.1–1.0)
NEUTROS ABS: 3.5 10*3/uL (ref 1.7–7.7)
NEUTROS PCT: 74 %
Platelets: 186 10*3/uL (ref 150–400)
RBC: 3.61 MIL/uL — AB (ref 3.87–5.11)
RDW: 20.4 % — ABNORMAL HIGH (ref 11.5–15.5)
WBC: 4.8 10*3/uL (ref 4.0–10.5)

## 2015-08-13 LAB — BASIC METABOLIC PANEL
ANION GAP: 7 (ref 5–15)
BUN: 90 mg/dL — ABNORMAL HIGH (ref 6–20)
CALCIUM: 8.7 mg/dL — AB (ref 8.9–10.3)
CHLORIDE: 123 mmol/L — AB (ref 101–111)
CO2: 15 mmol/L — ABNORMAL LOW (ref 22–32)
CREATININE: 4.78 mg/dL — AB (ref 0.44–1.00)
GFR calc non Af Amer: 8 mL/min — ABNORMAL LOW (ref >60)
GFR, EST AFRICAN AMERICAN: 9 mL/min — AB (ref >60)
Glucose, Bld: 134 mg/dL — ABNORMAL HIGH (ref 65–99)
Potassium: 5.8 mmol/L — ABNORMAL HIGH (ref 3.5–5.1)
SODIUM: 145 mmol/L (ref 135–145)

## 2015-08-13 LAB — I-STAT CG4 LACTIC ACID, ED
LACTIC ACID, VENOUS: 1.1 mmol/L (ref 0.5–2.0)
Lactic Acid, Venous: 0.95 mmol/L (ref 0.5–2.0)

## 2015-08-13 LAB — TROPONIN I
TROPONIN I: 0.06 ng/mL — AB (ref <0.031)
Troponin I: 0.05 ng/mL — ABNORMAL HIGH (ref <0.031)

## 2015-08-13 LAB — I-STAT TROPONIN, ED: TROPONIN I, POC: 0.07 ng/mL (ref 0.00–0.08)

## 2015-08-13 LAB — GLUCOSE, CAPILLARY: Glucose-Capillary: 116 mg/dL — ABNORMAL HIGH (ref 65–99)

## 2015-08-13 LAB — BRAIN NATRIURETIC PEPTIDE

## 2015-08-13 MED ORDER — INSULIN ASPART 100 UNIT/ML ~~LOC~~ SOLN: 0.0000 [IU] | Freq: Every day | SUBCUTANEOUS | Status: DC

## 2015-08-13 MED ORDER — SODIUM CHLORIDE 0.9 % IV SOLN: INTRAVENOUS | Status: AC

## 2015-08-13 MED ORDER — DEXTROSE 5 % IV SOLN: 2.0000 g | Freq: Three times a day (TID) | INTRAVENOUS | Status: DC

## 2015-08-13 MED ORDER — SODIUM BICARBONATE 8.4 % IV SOLN
INTRAVENOUS | Status: AC
  Filled 2015-08-13: qty 50

## 2015-08-13 MED ORDER — HYDRALAZINE HCL 25 MG PO TABS
75.0000 mg | ORAL_TABLET | Freq: Three times a day (TID) | ORAL | Status: DC
  Administered 2015-08-13 – 2015-08-14 (×3): 75 mg via ORAL
  Filled 2015-08-13 (×3): qty 3

## 2015-08-13 MED ORDER — DONEPEZIL HCL 5 MG PO TABS
10.0000 mg | ORAL_TABLET | Freq: Every day | ORAL | Status: DC
  Administered 2015-08-13: 10 mg via ORAL
  Filled 2015-08-13: qty 2

## 2015-08-13 MED ORDER — METOPROLOL SUCCINATE ER 50 MG PO TB24
50.0000 mg | ORAL_TABLET | Freq: Every day | ORAL | Status: DC
  Administered 2015-08-14: 50 mg via ORAL
  Filled 2015-08-13: qty 1

## 2015-08-13 MED ORDER — DEXTROSE 5 % IV SOLN
2.0000 g | INTRAVENOUS | Status: DC
  Administered 2015-08-15: 2 g via INTRAVENOUS
  Filled 2015-08-13 (×2): qty 2

## 2015-08-13 MED ORDER — CEFTAZIDIME 1 G IJ SOLR
1.0000 g | INTRAMUSCULAR | Status: DC
  Filled 2015-08-13: qty 1

## 2015-08-13 MED ORDER — TORSEMIDE 20 MG PO TABS
20.0000 mg | ORAL_TABLET | Freq: Every day | ORAL | Status: DC
  Administered 2015-08-14: 20 mg via ORAL
  Filled 2015-08-13: qty 1

## 2015-08-13 MED ORDER — MUPIROCIN 2 % EX OINT
1.0000 "application " | TOPICAL_OINTMENT | Freq: Two times a day (BID) | CUTANEOUS | Status: DC
  Administered 2015-08-13 – 2015-08-20 (×12): 1 via TOPICAL
  Filled 2015-08-13 (×4): qty 22

## 2015-08-13 MED ORDER — DEXTROSE 5 % IV SOLN
2.0000 g | Freq: Three times a day (TID) | INTRAVENOUS | Status: DC
  Administered 2015-08-13: 2 g via INTRAVENOUS
  Filled 2015-08-13 (×4): qty 2

## 2015-08-13 MED ORDER — ENOXAPARIN SODIUM 30 MG/0.3ML ~~LOC~~ SOLN
30.0000 mg | SUBCUTANEOUS | Status: DC
  Administered 2015-08-13 – 2015-08-19 (×8): 30 mg via SUBCUTANEOUS
  Filled 2015-08-13 (×7): qty 0.3

## 2015-08-13 MED ORDER — FUROSEMIDE 10 MG/ML IJ SOLN
60.0000 mg | Freq: Two times a day (BID) | INTRAMUSCULAR | Status: DC
  Administered 2015-08-13 – 2015-08-17 (×9): 60 mg via INTRAVENOUS
  Filled 2015-08-13 (×9): qty 6

## 2015-08-13 MED ORDER — LEVETIRACETAM 100 MG/ML PO SOLN
500.0000 mg | Freq: Two times a day (BID) | ORAL | Status: DC
  Administered 2015-08-13: 500 mg via ORAL
  Filled 2015-08-13 (×4): qty 5

## 2015-08-13 MED ORDER — SODIUM CHLORIDE 0.9 % IV SOLN
1250.0000 mg | Freq: Once | INTRAVENOUS | Status: AC
  Administered 2015-08-13: 1250 mg via INTRAVENOUS
  Filled 2015-08-13: qty 1250

## 2015-08-13 MED ORDER — LINAGLIPTIN 5 MG PO TABS
5.0000 mg | ORAL_TABLET | Freq: Every morning | ORAL | Status: DC
  Administered 2015-08-14 – 2015-08-20 (×5): 5 mg via ORAL
  Filled 2015-08-13 (×7): qty 1

## 2015-08-13 MED ORDER — RESOURCE THICKENUP CLEAR PO POWD
1.0000 | Freq: Every day | ORAL | Status: DC
  Administered 2015-08-15 – 2015-08-20 (×6): 120 g via ORAL
  Filled 2015-08-13: qty 125

## 2015-08-13 MED ORDER — INSULIN ASPART 100 UNIT/ML ~~LOC~~ SOLN
0.0000 [IU] | Freq: Three times a day (TID) | SUBCUTANEOUS | Status: DC
  Administered 2015-08-14: 2 [IU] via SUBCUTANEOUS
  Administered 2015-08-16 – 2015-08-19 (×4): 1 [IU] via SUBCUTANEOUS

## 2015-08-13 MED ORDER — FOLIC ACID 1 MG PO TABS
1.0000 mg | ORAL_TABLET | Freq: Every morning | ORAL | Status: DC
  Administered 2015-08-14 – 2015-08-20 (×6): 1 mg via ORAL
  Filled 2015-08-13 (×7): qty 1

## 2015-08-13 MED ORDER — STERILE WATER FOR INJECTION IV SOLN: INTRAVENOUS | Status: DC

## 2015-08-13 MED ORDER — INSULIN DETEMIR 100 UNIT/ML ~~LOC~~ SOLN
4.0000 [IU] | Freq: Every day | SUBCUTANEOUS | Status: DC
  Administered 2015-08-13 – 2015-08-16 (×4): 4 [IU] via SUBCUTANEOUS
  Filled 2015-08-13 (×7): qty 0.04

## 2015-08-13 MED ORDER — ALBUTEROL SULFATE (2.5 MG/3ML) 0.083% IN NEBU: 3.0000 mL | INHALATION_SOLUTION | Freq: Four times a day (QID) | RESPIRATORY_TRACT | Status: DC | PRN

## 2015-08-13 MED ORDER — STERILE WATER FOR INJECTION IV SOLN
INTRAVENOUS | Status: DC
  Administered 2015-08-13 – 2015-08-16 (×6): via INTRAVENOUS
  Filled 2015-08-13 (×10): qty 50

## 2015-08-13 MED ORDER — CALCITRIOL 0.25 MCG PO CAPS
0.2500 ug | ORAL_CAPSULE | Freq: Every day | ORAL | Status: DC
  Administered 2015-08-14: 0.25 ug via ORAL
  Filled 2015-08-13 (×2): qty 1

## 2015-08-13 MED ORDER — ISOSORBIDE MONONITRATE ER 30 MG PO TB24
15.0000 mg | ORAL_TABLET | Freq: Every morning | ORAL | Status: DC
  Administered 2015-08-14: 15 mg via ORAL
  Filled 2015-08-13: qty 1

## 2015-08-13 MED ORDER — PRAVASTATIN SODIUM 10 MG PO TABS
20.0000 mg | ORAL_TABLET | Freq: Every day | ORAL | Status: DC
  Administered 2015-08-13: 20 mg via ORAL
  Filled 2015-08-13: qty 2

## 2015-08-13 MED ORDER — PANTOPRAZOLE SODIUM 40 MG PO TBEC
40.0000 mg | DELAYED_RELEASE_TABLET | Freq: Every morning | ORAL | Status: DC
  Administered 2015-08-14: 40 mg via ORAL
  Filled 2015-08-13: qty 1

## 2015-08-13 NOTE — H&P (Signed)
[facility] History and Physical  [name] OQH:[id number] DOB: 29-Jan-1941 DOA: 08/13/2015  Referring physician: ER PCP: [name], MD   Chief Complaint: Dyspnea. Weakness.  HPI: [name] is a 74 y.o. female  This is a 74 year old lady who has dementia and cannot give me any clear history. Apparently, she was visiting hematology clinic today because of chronic anemia and was due to get an injection for her anemia. She was found to be short of breath and family admits that she had been short of breath for the last couple of days. She seemed to be worse today. She was sent to the emergency room and found to be saturating 100% on 2 L oxygen. She does have underlying COPD and also a history of congestive heart failure. She says to me that she has been coughing and also has had a fever but does not seem to confirm dyspnea. Nonetheless, emergency room evaluation finds her to have right lower lobe pneumonia. She is now being admitted for further management.   Review of Systems:  Unable to obtain review of systems secondary to the patient's dementia.  Past Medical History  Diagnosis Date  . Type 2 diabetes mellitus   . Essential hypertension, benign   . History of stroke     Previously on Coumadin  . MI, old     Reported 63  . Seizure disorder   . Coronary atherosclerosis of native coronary artery     Multivessel status post CABG 2002  . History of GI bleed     Erosive gastritis and duodenitis by EGD 2002  . Mixed hyperlipidemia   . Ischemic cardiomyopathy     LVEF 40-45% March 2013  . Dementia   . CKD (chronic kidney disease) stage 4, GFR 15-29 ml/min   . CHF (congestive heart failure)   . Chronic respiratory failure with hypoxia   . Stroke   . Seizures   . Anemia of chronic renal failure, stage 4 (severe) 01/02/2015   Past Surgical History  Procedure Laterality Date  . Coronary artery bypass graft  July 2002    LIMA to LAD, SVG to OM1, SVG to OM 2, SVG to  RCA  . Tee without cardioversion  01/28/2012    Procedure: TRANSESOPHAGEAL ECHOCARDIOGRAM (TEE);  Surgeon: Lelon Perla, MD;  Location: [facility] ENDOSCOPY;  Service: Cardiovascular;  Laterality: [name];  . Colonoscopy  2002  . Esophagogastroduodenoscopy  2002  . Esophagogastroduodenoscopy [name] 01/18/2014    TWS:FKCLE hiatal hernia. Abnormal gastric and duodenal bulbar mucosa of uncertain significance - status post gastric bx (chronic gastritis/H.pylori +  . Colonoscopy [name] 06/25/2014    Dr. Laural Golden: tubular adenoma, pandiverticulosis.   . Agile capsule [name] 07/06/2014    Procedure: AGILE CAPSULE;  Surgeon: Danie Binder, MD;  Location: [facility] ENDO SUITE;  Service: Endoscopy;  Laterality: [name];  730  . Esophagogastroduodenoscopy [name] 07/20/2014    Dr. Gala Romney: tiny gastric polyps not manipulated. minimal pigmentation of duodenal bulb likely not significant  . Givens capsule study [name] 07/20/2014    incomplete   Social History:  reports that she quit smoking about 3 years ago. Her smoking use included Cigarettes. She started smoking about 54 years ago. She has a 50 pack-year smoking history. She has never used smokeless tobacco. She reports that she does not drink alcohol or use illicit drugs.  No Known Allergies  Family History  Problem Relation Age of Onset  . Stroke Father   . Diabetes type II Mother   .  Colon cancer Neg Hx     Prior to Admission medications   Medication Sig Start Date End Date Taking? Authorizing Provider  albuterol (PROVENTIL HFA;VENTOLIN HFA) 108 (90 BASE) MCG/ACT inhaler Inhale 2 puffs into the lungs every 6 (six) hours as needed for wheezing or shortness of breath. 08/02/15  Yes Arnoldo Lenis, MD  calcitRIOL (ROCALTROL) 0.25 MCG capsule Take 1 capsule (0.25 mcg total) by mouth daily. 05/02/14  Yes Eugenie Filler, MD  donepezil (ARICEPT) 10 MG tablet Take 10 mg by mouth at bedtime.  01/30/14  Yes Historical Provider, MD  folic acid (FOLVITE) 1 MG tablet Take 1 mg by mouth every  morning.    Yes Historical Provider, MD  hydrALAZINE (APRESOLINE) 25 MG tablet Take 75 mg by mouth every 8 (eight) hours.    Yes Historical Provider, MD  insulin detemir (LEVEMIR) 100 UNIT/ML injection Inject 0.04 mLs (4 Units total) into the skin at bedtime. 07/13/15  Yes Samuella Cota, MD  isosorbide mononitrate (IMDUR) 30 MG 24 hr tablet Take 0.5 tablets (15 mg total) by mouth every morning. 01/09/15  Yes Lezlie Octave Black, NP  levETIRAcetam (KEPPRA) 100 MG/ML solution Take 500 mg by mouth 2 (two) times daily.  09/29/14  Yes Historical Provider, MD  linagliptin (TRADJENTA) 5 MG TABS tablet Take 5 mg by mouth every morning.    Yes Historical Provider, MD  Maltodextrin-Xanthan Gum (RESOURCE THICKENUP CLEAR) POWD Take 120 g by mouth as needed. Patient taking differently: Take 1 Can by mouth daily.  01/09/15  Yes Lezlie Octave Black, NP  metoprolol succinate (TOPROL-XL) 100 MG 24 hr tablet Take 50 mg by mouth daily. Take with or immediately following a meal.   Yes Historical Provider, MD  mupirocin ointment (BACTROBAN) 2 % Apply 1 application topically 2 (two) times daily.  01/03/15  Yes Historical Provider, MD  pantoprazole (PROTONIX) 40 MG tablet Take 40 mg by mouth every morning.   Yes Historical Provider, MD  potassium chloride (K-DUR) 10 MEQ tablet Take 2 tablets by mouth daily. 07/25/15  Yes Historical Provider, MD  pravastatin (PRAVACHOL) 20 MG tablet Take 20 mg by mouth at bedtime.    Yes Historical Provider, MD  torsemide (DEMADEX) 20 MG tablet Take 20 mg by mouth daily.   Yes Historical Provider, MD   Physical Exam: Filed Vitals:   08/13/15 1614 08/13/15 1630 08/13/15 1700 08/13/15 1742  BP: 141/64 132/64 123/66 138/64  Pulse: 51 65 64   Temp:    97.5 F (36.4 C)  TempSrc:    Axillary  Resp: 20   21  Height:      Weight:    63.3 kg (139 lb 8.8 oz)  SpO2: 100% 100% 100% 100%    Wt Readings from Last 3 Encounters:  08/13/15 63.3 kg (139 lb 8.8 oz)  08/02/15 64.864 kg (143 lb)  07/18/15  66.225 kg (146 lb)    General:  Appears calm and comfortable. She does not appear to be toxic. Eyes: PERRL, normal lids, irises & conjunctiva ENT: grossly normal hearing, lips & tongue Neck: no LAD, masses or thyromegaly Cardiovascular: RRR, no m/r/g. No LE edema. Telemetry: SR, no arrhythmias  Respiratory: Crackles in both bases more on the right than the left. There is no wheezing or bronchial breathing. Abdomen: soft, ntnd Skin: no rash or induration seen on limited exam Musculoskeletal: grossly normal tone BUE/BLE Psychiatric: grossly normal mood and affect, speech fluent and appropriate Neurologic: grossly non-focal.  Labs on Admission:  Basic Metabolic Panel:  Recent Labs Lab 08/07/15 1107 08/13/15 1328  NA 142 145  K 5.0 5.8*  CL 119* 123*  CO2 18* 15*  GLUCOSE 106* 134*  BUN 94* 90*  CREATININE 4.66* 4.78*  CALCIUM 8.4* 8.7*  MG 2.2  --    Liver Function Tests: No results for input(s): AST, ALT, ALKPHOS, BILITOT, PROT, ALBUMIN in the last 168 hours. No results for input(s): LIPASE, AMYLASE in the last 168 hours. No results for input(s): AMMONIA in the last 168 hours. CBC:  Recent Labs Lab 08/13/15 1328  WBC 4.8  NEUTROABS 3.5  HGB 9.7*  HCT 32.9*  MCV 91.1  PLT 186   Cardiac Enzymes:  Recent Labs Lab 08/13/15 1328 08/13/15 1519  TROPONINI 0.06* 0.05*    BNP (last 3 results)  Recent Labs  05/28/15 1148 07/03/15 1514 08/13/15 1328  BNP 1084.0* 3737.0* >4500.0*    ProBNP (last 3 results) No results for input(s): PROBNP in the last 8760 hours.  CBG: No results for input(s): GLUCAP in the last 168 hours.  Radiological Exams on Admission: Dg Chest Portable 1 View  08/13/2015   CLINICAL DATA:  Shortness of breath  EXAM: PORTABLE CHEST - 1 VIEW  COMPARISON:  07/06/2015  FINDINGS: Moderate to severe cardiac enlargement. Patient is status post prior CABG. Mild vascular congestion. Left lung appears clear. Right diaphragm is elevated  similar to prior study. There is mild hazy density in the right lower lobe as well as blunting of the right costophrenic angle.  IMPRESSION: Right lower lobe infiltrate with small right pleural effusion. Pneumonia/pneumonitis suspected.   Electronically Signed   By: Skipper Cliche M.D.   On: 08/13/2015 13:21      Assessment/Plan   1. Healthcare associated pneumonia. This appears to be in the right lower lobe. She does have a history of choking episodes and in view of her dementia, I'm wondering whether she is having difficulty in swallowing. We will obtain swallowing evaluation. In the meantime, she will be treated with broad-spectrum antibiotics for healthcare associated pneumonia. 2. Chronic kidney disease. Appears to be mostly stable with slight decompensation. Nephrology consultation. 3. Hyperkalemia. Discontinue oral potassium and monitor potassium closely. 4. Diabetes. Continue with home medications and sliding scale of insulin. 5. Dementia. Appears to be stable and at baseline. Continue with Aricept. 6. History of chronic systolic heart failure. She appears to be compensated and her most recent echocardiogram just a month ago shows ejection fraction 55-60% with evidence of diastolic dysfunction.  Further recommendations will depend on patient's hospital progress.  Code Status: Full code.   DVT Prophylaxis: Lovenox.  Family Communication: No family members present at the bedside.  Disposition Plan: Depending on progress.  Time spent: 60 minutes.  Doree Albee [facility] Pager [phone].

## 2015-08-13 NOTE — ED Provider Notes (Signed)
CSN: [id number]     Arrival date & time 08/13/15  1235 History   First MD Initiated Contact with Patient 08/13/15 1309     Chief Complaint  Patient presents with  . Shortness of Breath  . Weakness     (Consider location/radiation/quality/duration/timing/severity/associated sxs/prior Treatment) Patient is a 74 y.o. female presenting with shortness of breath and weakness. The history is provided by the patient.  Shortness of Breath Associated symptoms: no abdominal pain, no chest pain, no fever, no headaches, no rash and no vomiting   Weakness Associated symptoms include shortness of breath. Pertinent negatives include no chest pain, no abdominal pain and no headaches.   patient has a history of dementia other review of systems put together between patient and daughter.  Patient was at hematology oncology clinic today for her anemia shot. Patient does not have a history of cancer. The patient had been noted to be developing shortness of breath since Sunday. Seem to be worse today. Patient seemed short of breath in the clinic. Upon arrival down here sats were 100% on her normal 2 L that she is on all the time. She is on this for COPD. Patient also has a history of congestive heart failure. History of renal insufficiency. And a history of dementia. Patient denies any specific complaints other than being short of breath.  Past Medical History  Diagnosis Date  . Type 2 diabetes mellitus   . Essential hypertension, benign   . History of stroke     Previously on Coumadin  . MI, old     Reported 68  . Seizure disorder   . Coronary atherosclerosis of native coronary artery     Multivessel status post CABG 2002  . History of GI bleed     Erosive gastritis and duodenitis by EGD 2002  . Mixed hyperlipidemia   . Ischemic cardiomyopathy     LVEF 40-45% March 2013  . Dementia   . CKD (chronic kidney disease) stage 4, GFR 15-29 ml/min   . CHF (congestive heart failure)   . Chronic respiratory  failure with hypoxia   . Stroke   . Seizures   . Anemia of chronic renal failure, stage 4 (severe) 01/02/2015   Past Surgical History  Procedure Laterality Date  . Coronary artery bypass graft  July 2002    LIMA to LAD, SVG to OM1, SVG to OM 2, SVG to RCA  . Tee without cardioversion  01/28/2012    Procedure: TRANSESOPHAGEAL ECHOCARDIOGRAM (TEE);  Surgeon: Lelon Perla, MD;  Location: [facility] ENDOSCOPY;  Service: Cardiovascular;  Laterality: [name];  . Colonoscopy  2002  . Esophagogastroduodenoscopy  2002  . Esophagogastroduodenoscopy [name] 01/18/2014    NWG:NFAOZ hiatal hernia. Abnormal gastric and duodenal bulbar mucosa of uncertain significance - status post gastric bx (chronic gastritis/H.pylori +  . Colonoscopy [name] 06/25/2014    Dr. Laural Golden: tubular adenoma, pandiverticulosis.   . Agile capsule [name] 07/06/2014    Procedure: AGILE CAPSULE;  Surgeon: Danie Binder, MD;  Location: [facility] ENDO SUITE;  Service: Endoscopy;  Laterality: [name];  730  . Esophagogastroduodenoscopy [name] 07/20/2014    Dr. Gala Romney: tiny gastric polyps not manipulated. minimal pigmentation of duodenal bulb likely not significant  . Givens capsule study [name] 07/20/2014    incomplete   Family History  Problem Relation Age of Onset  . Stroke Father   . Diabetes type II Mother   . Colon cancer Neg Hx    Social History  Substance Use Topics  . Smoking  status: Former Smoker -- 1.00 packs/day for 50 years    Types: Cigarettes    Start date: 02/22/1961    Quit date: 02/23/2012  . Smokeless tobacco: Never Used  . Alcohol Use: No   OB History    No data available     Review of Systems  Constitutional: Negative for fever.  HENT: Negative for congestion.   Eyes: Negative for redness.  Respiratory: Positive for shortness of breath.   Cardiovascular: Negative for chest pain.  Gastrointestinal: Negative for nausea, vomiting and abdominal pain.  Genitourinary: Negative for dysuria.  Musculoskeletal: Negative for back pain.  Skin:  Negative for rash.  Neurological: Positive for weakness. Negative for headaches.  Hematological: Does not bruise/bleed easily.  Psychiatric/Behavioral: Positive for confusion.      Allergies  Review of patient's allergies indicates no known allergies.  Home Medications   Prior to Admission medications   Medication Sig Start Date End Date Taking? Authorizing Provider  albuterol (PROVENTIL HFA;VENTOLIN HFA) 108 (90 BASE) MCG/ACT inhaler Inhale 2 puffs into the lungs every 6 (six) hours as needed for wheezing or shortness of breath. 08/02/15  Yes Arnoldo Lenis, MD  calcitRIOL (ROCALTROL) 0.25 MCG capsule Take 1 capsule (0.25 mcg total) by mouth daily. 05/02/14  Yes Eugenie Filler, MD  donepezil (ARICEPT) 10 MG tablet Take 10 mg by mouth at bedtime.  01/30/14  Yes Historical Provider, MD  folic acid (FOLVITE) 1 MG tablet Take 1 mg by mouth every morning.    Yes Historical Provider, MD  hydrALAZINE (APRESOLINE) 25 MG tablet Take 75 mg by mouth every 8 (eight) hours.    Yes Historical Provider, MD  insulin detemir (LEVEMIR) 100 UNIT/ML injection Inject 0.04 mLs (4 Units total) into the skin at bedtime. 07/13/15  Yes Samuella Cota, MD  isosorbide mononitrate (IMDUR) 30 MG 24 hr tablet Take 0.5 tablets (15 mg total) by mouth every morning. 01/09/15  Yes Lezlie Octave Black, NP  levETIRAcetam (KEPPRA) 100 MG/ML solution Take 500 mg by mouth 2 (two) times daily.  09/29/14  Yes Historical Provider, MD  linagliptin (TRADJENTA) 5 MG TABS tablet Take 5 mg by mouth every morning.    Yes Historical Provider, MD  Maltodextrin-Xanthan Gum (RESOURCE THICKENUP CLEAR) POWD Take 120 g by mouth as needed. Patient taking differently: Take 1 Can by mouth daily.  01/09/15  Yes Lezlie Octave Black, NP  metoprolol succinate (TOPROL-XL) 100 MG 24 hr tablet Take 50 mg by mouth daily. Take with or immediately following a meal.   Yes Historical Provider, MD  mupirocin ointment (BACTROBAN) 2 % Apply 1 application topically 2 (two)  times daily.  01/03/15  Yes Historical Provider, MD  pantoprazole (PROTONIX) 40 MG tablet Take 40 mg by mouth every morning.   Yes Historical Provider, MD  potassium chloride (K-DUR) 10 MEQ tablet Take 2 tablets by mouth daily. 07/25/15  Yes Historical Provider, MD  pravastatin (PRAVACHOL) 20 MG tablet Take 20 mg by mouth at bedtime.    Yes Historical Provider, MD  torsemide (DEMADEX) 20 MG tablet Take 20 mg by mouth daily.   Yes Historical Provider, MD   BP 141/64 mmHg  Pulse 51  Temp(Src) 98 F (36.7 C) (Oral)  Resp 20  Ht 5\' 4"  (1.626 m)  Wt 139 lb (63.05 kg)  BMI 23.85 kg/m2  SpO2 100% Physical Exam  Constitutional: She is oriented to person, place, and time. She appears well-developed and well-nourished. No distress.  HENT:  Head: Normocephalic.  Eyes: Conjunctivae and  EOM are normal. Pupils are equal, round, and reactive to light.  Neck: Normal range of motion.  Cardiovascular: Normal rate, regular rhythm and normal heart sounds.   No murmur heard. Pulmonary/Chest: Effort normal. No respiratory distress. She has rales.  Abdominal: Soft. Bowel sounds are normal. There is no tenderness.  Musculoskeletal: Normal range of motion. She exhibits no edema.  Neurological: She is alert and oriented to person, place, and time. No cranial nerve deficit. She exhibits normal muscle tone. Coordination normal.  Skin: Skin is warm. No rash noted.  Nursing note and vitals reviewed.   ED Course  Procedures (including critical care time) Labs Review Labs Reviewed  CBC WITH DIFFERENTIAL/PLATELET - Abnormal; Notable for the following:    RBC 3.61 (*)    Hemoglobin 9.7 (*)    HCT 32.9 (*)    MCHC 29.5 (*)    RDW 20.4 (*)    All other components within normal limits  BASIC METABOLIC PANEL - Abnormal; Notable for the following:    Potassium 5.8 (*)    Chloride 123 (*)    CO2 15 (*)    Glucose, Bld 134 (*)    BUN 90 (*)    Creatinine, Ser 4.78 (*)    Calcium 8.7 (*)    GFR calc non Af  Amer 8 (*)    GFR calc Af Amer 9 (*)    All other components within normal limits  TROPONIN I - Abnormal; Notable for the following:    Troponin I 0.06 (*)    All other components within normal limits  BRAIN NATRIURETIC PEPTIDE - Abnormal; Notable for the following:    B Natriuretic Peptide >4500.0 (*)    All other components within normal limits  TROPONIN I - Abnormal; Notable for the following:    Troponin I 0.05 (*)    All other components within normal limits  CULTURE, BLOOD (ROUTINE X 2)  CULTURE, BLOOD (ROUTINE X 2)  I-STAT CG4 LACTIC ACID, ED  I-STAT TROPOININ, ED  I-STAT CG4 LACTIC ACID, ED   Results for orders placed or performed during the hospital encounter of 08/13/15  CBC with Differential  Result Value Ref Range   WBC 4.8 4.0 - 10.5 K/uL   RBC 3.61 (L) 3.87 - 5.11 MIL/uL   Hemoglobin 9.7 (L) 12.0 - 15.0 g/dL   HCT 32.9 (L) 36.0 - 46.0 %   MCV 91.1 78.0 - 100.0 fL   MCH 26.9 26.0 - 34.0 pg   MCHC 29.5 (L) 30.0 - 36.0 g/dL   RDW 20.4 (H) 11.5 - 15.5 %   Platelets 186 150 - 400 K/uL   Neutrophils Relative % 74 %   Neutro Abs 3.5 1.7 - 7.7 K/uL   Lymphocytes Relative 14 %   Lymphs Abs 0.7 0.7 - 4.0 K/uL   Monocytes Relative 11 %   Monocytes Absolute 0.5 0.1 - 1.0 K/uL   Eosinophils Relative 0 %   Eosinophils Absolute 0.0 0.0 - 0.7 K/uL   Basophils Relative 1 %   Basophils Absolute 0.0 0.0 - 0.1 K/uL  Basic metabolic panel  Result Value Ref Range   Sodium 145 135 - 145 mmol/L   Potassium 5.8 (H) 3.5 - 5.1 mmol/L   Chloride 123 (H) 101 - 111 mmol/L   CO2 15 (L) 22 - 32 mmol/L   Glucose, Bld 134 (H) 65 - 99 mg/dL   BUN 90 (H) 6 - 20 mg/dL   Creatinine, Ser 4.78 (H) 0.44 - 1.00 mg/dL  Calcium 8.7 (L) 8.9 - 10.3 mg/dL   GFR calc non Af Amer 8 (L) >60 mL/min   GFR calc Af Amer 9 (L) >60 mL/min   Anion gap 7 5 - 15  Troponin I  Result Value Ref Range   Troponin I 0.06 (H) <0.031 ng/mL  Brain natriuretic peptide  Result Value Ref Range   B Natriuretic  Peptide >4500.0 (H) 0.0 - 100.0 pg/mL  Troponin I  Result Value Ref Range   Troponin I 0.05 (H) <0.031 ng/mL  I-Stat CG4 Lactic Acid, ED  Result Value Ref Range   Lactic Acid, Venous 1.10 0.5 - 2.0 mmol/L  I-Stat Troponin, ED (not at [name])  Result Value Ref Range   Troponin i, poc 0.07 0.00 - 0.08 ng/mL   Comment 3             Imaging Review Dg Chest Portable 1 View  08/13/2015   CLINICAL DATA:  Shortness of breath  EXAM: PORTABLE CHEST - 1 VIEW  COMPARISON:  07/06/2015  FINDINGS: Moderate to severe cardiac enlargement. Patient is status post prior CABG. Mild vascular congestion. Left lung appears clear. Right diaphragm is elevated similar to prior study. There is mild hazy density in the right lower lobe as well as blunting of the right costophrenic angle.  IMPRESSION: Right lower lobe infiltrate with small right pleural effusion. Pneumonia/pneumonitis suspected.   Electronically Signed   By: Skipper Cliche M.D.   On: 08/13/2015 13:21   I have personally reviewed and evaluated these images and lab results as part of my medical decision-making.   EKG Interpretation   Date/Time:  Monday August 13 2015 12:43:47 EDT Ventricular Rate:  66 PR Interval:  182 QRS Duration: 92 QT Interval:  475 QTC Calculation: 498 R Axis:   69 Text Interpretation:  Sinus rhythm Atrial premature complex Low voltage  with right axis deviation Minimal ST depression Borderline prolonged QT  interval No significant change since last tracing Confirmed by KNAPP   MD-J, JON ([id number]) on 08/13/2015 12:47:17 PM      MDM   Final diagnoses:  SOB (shortness of breath)  Renal insufficiency  HCAP (healthcare-associated pneumonia)    Chest x-ray consistent with the right-sided pneumonia and right pleural effusion. Patient with shortness of breath but oxygen saturation is her stable on her normal 2 L of oxygen. Patient started on HCAP Pneumonia protocol.  Patient with long-standing renal insufficiency as well.  Patient with a history of anemia which isn't significantly worse. She was at the hematology oncology clinic today. [facility] to get her anemia shot but she was short of breath so she was sent down here. Blood cultures done and are pending. Patient without a fever no significant leukocytosis. Past history seems to indicate that she may have had a history of CHF in the past. BNP is significantly elevated. Troponin is also slightly elevated think this is due to her chronic renal insufficiency insufficiency that has been slightly elevated every time is been checked. Does not seem to be consistent with acute cardiac disease. EKG without any acute changes patient without complaint of chest pain.  In addition potassium is slightly elevated compared to baseline but her BUN and creatinine are comparable to what they were on September 13. Potassium coming at 5.8. No evidence of any acute of changes on the EKG of hyperkalemia. However this will need to be monitored.  Fredia Sorrow, MD 08/13/15 1655

## 2015-08-13 NOTE — Progress Notes (Signed)
Sent to ED

## 2015-08-13 NOTE — Assessment & Plan Note (Deleted)
Anemia of chronic renal disease, Stage IV.  On ESA therapy consisting of Aranesp 100 mcg every 2 weeks beginning on 07/16/2015 (after being on 80 mcg every 2 weeks).

## 2015-08-13 NOTE — Progress Notes (Signed)
Patient was distressed, [name]. Recommended a visit to E.R.  Did not draw labs. Will cancel linked orders.

## 2015-08-13 NOTE — Progress Notes (Signed)
ANTIBIOTIC CONSULT NOTE - INITIAL  Pharmacy Consult for vancomycin Indication: pneumonia  No Known Allergies  Patient Measurements: Height: 5\' 4"  (162.6 cm) Weight: 139 lb (63.05 kg) IBW/kg (Calculated) : 54.7   Vital Signs: Temp: 98 F (36.7 C) (09/19 1245) Temp Source: Oral (09/19 1245) BP: 147/69 mmHg (09/19 1330) Pulse Rate: 63 (09/19 1330) Intake/Output from previous day:   Intake/Output from this shift:    Labs:  Recent Labs  08/13/15 1328  WBC 4.8  HGB 9.7*  PLT 186  CREATININE 4.78*   Estimated Creatinine Clearance: 8.9 mL/min (by C-G formula based on Cr of 4.78). No results for input(s): VANCOTROUGH, VANCOPEAK, VANCORANDOM, GENTTROUGH, GENTPEAK, GENTRANDOM, TOBRATROUGH, TOBRAPEAK, TOBRARND, AMIKACINPEAK, AMIKACINTROU, AMIKACIN in the last 72 hours.   Microbiology: No results found for this or any previous visit (from the past 720 hour(s)).  Medical History: Past Medical History  Diagnosis Date  . Type 2 diabetes mellitus   . Essential hypertension, benign   . History of stroke     Previously on Coumadin  . MI, old     Reported 50  . Seizure disorder   . Coronary atherosclerosis of native coronary artery     Multivessel status post CABG 2002  . History of GI bleed     Erosive gastritis and duodenitis by EGD 2002  . Mixed hyperlipidemia   . Ischemic cardiomyopathy     LVEF 40-45% March 2013  . Dementia   . CKD (chronic kidney disease) stage 4, GFR 15-29 ml/min   . CHF (congestive heart failure)   . Chronic respiratory failure with hypoxia   . Stroke   . Seizures   . Anemia of chronic renal failure, stage 4 (severe) 01/02/2015    Medications:  See medication history Assessment: 74 yo lady to start broad spectrum antibiotics for PNA.  She has a h/o CKD with CrCl~9 ml/min.  Goal of Therapy:  Vancomycin trough level 15-20 mcg/ml  Plan:  Vancomycin 1250 mg IV X 1. Will check random level in ~48 hours for redose. Will adjust fortaz dose to  2 gm IV q48 hours. F/u renal function, cultures and clinical course  Thanks for allowing pharmacy to be a part of this patient's care.  Excell Seltzer, PharmD Clinical Pharmacist 08/13/2015,2:29 PM

## 2015-08-13 NOTE — Consult Note (Signed)
Reason for Consult: Acute kidney injury superimposed on chronic Referring Physician: Dr. Tor Netters [name] is an 74 y.o. female.  HPI: She is a patient with history of diabetes, CVA, coronary artery disease, chronic renal failure stage IV presently brought by her family'[name] because of weakness and increased confusion. Patient also has some coughing and difficulty breathing. She didn't have any nausea or vomiting. Presently patient offers no complaints. She has underlying history of dementia. Consult is called a cause of worsening of her renal failure.  Past Medical History  Diagnosis Date  . Type 2 diabetes mellitus   . Essential hypertension, benign   . History of stroke     Previously on Coumadin  . MI, old     Reported 84  . Seizure disorder   . Coronary atherosclerosis of native coronary artery     Multivessel status post CABG 2002  . History of GI bleed     Erosive gastritis and duodenitis by EGD 2002  . Mixed hyperlipidemia   . Ischemic cardiomyopathy     LVEF 40-45% March 2013  . Dementia   . CKD (chronic kidney disease) stage 4, GFR 15-29 ml/min   . CHF (congestive heart failure)   . Chronic respiratory failure with hypoxia   . Stroke   . Seizures   . Anemia of chronic renal failure, stage 4 (severe) 01/02/2015    Past Surgical History  Procedure Laterality Date  . Coronary artery bypass graft  July 2002    LIMA to LAD, SVG to OM1, SVG to OM 2, SVG to RCA  . Tee without cardioversion  01/28/2012    Procedure: TRANSESOPHAGEAL ECHOCARDIOGRAM (TEE);  Surgeon: Lelon Perla, MD;  Location: [facility] ENDOSCOPY;  Service: Cardiovascular;  Laterality: [name];  . Colonoscopy  2002  . Esophagogastroduodenoscopy  2002  . Esophagogastroduodenoscopy [name] 01/18/2014    [id number] hiatal hernia. Abnormal gastric and duodenal bulbar mucosa of uncertain significance - status post gastric bx (chronic gastritis/H.pylori +  . Colonoscopy [name] 06/25/2014    Dr. Laural Golden: tubular adenoma,  pandiverticulosis.   . Agile capsule [name] 07/06/2014    Procedure: AGILE CAPSULE;  Surgeon: Danie Binder, MD;  Location: [facility] ENDO SUITE;  Service: Endoscopy;  Laterality: [name];  730  . Esophagogastroduodenoscopy [name] 07/20/2014    Dr. Gala Romney: tiny gastric polyps not manipulated. minimal pigmentation of duodenal bulb likely not significant  . Givens capsule study [name] 07/20/2014    incomplete    Family History  Problem Relation Age of Onset  . Stroke Father   . Diabetes type II Mother   . Colon cancer Neg Hx     Social History:  reports that she quit smoking about 3 years ago. Her smoking use included Cigarettes. She started smoking about 54 years ago. She has a 50 pack-year smoking history. She has never used smokeless tobacco. She reports that she does not drink alcohol or use illicit drugs.  Allergies: No Known Allergies  Medications: I have reviewed the patient'[name] current medications.  Results for orders placed or performed during the hospital encounter of 08/13/15 (from the past 48 hour([name]))  CBC with Differential     Status: Abnormal   Collection Time: 08/13/15  1:28 PM  Result Value Ref Range   WBC 4.8 4.0 - 10.5 K/uL   RBC 3.61 (L) 3.87 - 5.11 MIL/uL   Hemoglobin 9.7 (L) 12.0 - 15.0 g/dL   HCT 32.9 (L) 36.0 - 46.0 %   MCV 91.1 78.0 - 100.0 fL  MCH 26.9 26.0 - 34.0 pg   MCHC 29.5 (L) 30.0 - 36.0 g/dL   RDW 20.4 (H) 11.5 - 15.5 %   Platelets 186 150 - 400 K/uL   Neutrophils Relative % 74 %   Neutro Abs 3.5 1.7 - 7.7 K/uL   Lymphocytes Relative 14 %   Lymphs Abs 0.7 0.7 - 4.0 K/uL   Monocytes Relative 11 %   Monocytes Absolute 0.5 0.1 - 1.0 K/uL   Eosinophils Relative 0 %   Eosinophils Absolute 0.0 0.0 - 0.7 K/uL   Basophils Relative 1 %   Basophils Absolute 0.0 0.0 - 0.1 K/uL  Basic metabolic panel     Status: Abnormal   Collection Time: 08/13/15  1:28 PM  Result Value Ref Range   Sodium 145 135 - 145 mmol/L   Potassium 5.8 (H) 3.5 - 5.1 mmol/L   Chloride 123 (H) 101 -  111 mmol/L   CO2 15 (L) 22 - 32 mmol/L   Glucose, Bld 134 (H) 65 - 99 mg/dL   BUN 90 (H) 6 - 20 mg/dL    Comment: RESULTS CONFIRMED BY MANUAL DILUTION   Creatinine, Ser 4.78 (H) 0.44 - 1.00 mg/dL   Calcium 8.7 (L) 8.9 - 10.3 mg/dL   GFR calc non Af Amer 8 (L) >60 mL/min   GFR calc Af Amer 9 (L) >60 mL/min    Comment: (NOTE) The eGFR has been calculated using the CKD EPI equation. This calculation has not been validated in all clinical situations. eGFR'[name] persistently <60 mL/min signify possible Chronic Kidney Disease.    Anion gap 7 5 - 15  Troponin I     Status: Abnormal   Collection Time: 08/13/15  1:28 PM  Result Value Ref Range   Troponin I 0.06 (H) <0.031 ng/mL    Comment:        PERSISTENTLY INCREASED TROPONIN VALUES IN THE RANGE OF 0.04-0.49 ng/mL CAN BE SEEN IN:       -UNSTABLE ANGINA       -CONGESTIVE HEART FAILURE       -MYOCARDITIS       -CHEST TRAUMA       -ARRYHTHMIAS       -LATE PRESENTING MYOCARDIAL INFARCTION       -COPD   CLINICAL FOLLOW-UP RECOMMENDED.   Brain natriuretic peptide     Status: Abnormal   Collection Time: 08/13/15  1:28 PM  Result Value Ref Range   B Natriuretic Peptide >4500.0 (H) 0.0 - 100.0 pg/mL  I-Stat Troponin, ED (not at [facility])     Status: None   Collection Time: 08/13/15  2:06 PM  Result Value Ref Range   Troponin i, poc 0.07 0.00 - 0.08 ng/mL   Comment 3            Comment: Due to the release kinetics of cTnI, a negative result within the first hours of the onset of symptoms does not rule out myocardial infarction with certainty. If myocardial infarction is still suspected, repeat the test at appropriate intervals.   I-Stat CG4 Lactic Acid, ED     Status: None   Collection Time: 08/13/15  2:09 PM  Result Value Ref Range   Lactic Acid, Venous 1.10 0.5 - 2.0 mmol/L  Troponin I     Status: Abnormal   Collection Time: 08/13/15  3:19 PM  Result Value Ref Range   Troponin I 0.05 (H) <0.031 ng/mL    Comment:        PERSISTENTLY  INCREASED  TROPONIN VALUES IN THE RANGE OF 0.04-0.49 ng/mL CAN BE SEEN IN:       -UNSTABLE ANGINA       -CONGESTIVE HEART FAILURE       -MYOCARDITIS       -CHEST TRAUMA       -ARRYHTHMIAS       -LATE PRESENTING MYOCARDIAL INFARCTION       -COPD   CLINICAL FOLLOW-UP RECOMMENDED.   I-Stat CG4 Lactic Acid, ED     Status: None   Collection Time: 08/13/15  5:21 PM  Result Value Ref Range   Lactic Acid, Venous 0.95 0.5 - 2.0 mmol/L    Dg Chest Portable 1 View  08/13/2015   CLINICAL DATA:  Shortness of breath  EXAM: PORTABLE CHEST - 1 VIEW  COMPARISON:  07/06/2015  FINDINGS: Moderate to severe cardiac enlargement. Patient is status post prior CABG. Mild vascular congestion. Left lung appears clear. Right diaphragm is elevated similar to prior study. There is mild hazy density in the right lower lobe as well as blunting of the right costophrenic angle.  IMPRESSION: Right lower lobe infiltrate with small right pleural effusion. Pneumonia/pneumonitis suspected.   Electronically Signed   By: Skipper Cliche M.D.   On: 08/13/2015 13:21    Review of Systems  Constitutional: Negative for fever and chills.  HENT: Positive for congestion.   Respiratory: Positive for cough and shortness of breath. Negative for sputum production.   Cardiovascular: Negative for chest pain, palpitations and leg swelling.  Gastrointestinal: Negative for nausea, vomiting and abdominal pain.  Neurological: Positive for weakness.   Blood pressure 138/64, pulse 64, temperature 97.5 F (36.4 C), temperature source Axillary, resp. rate 21, height 5' 4"  (1.626 m), weight 139 lb 8.8 oz (63.3 kg), SpO2 100 %. Physical Exam  Constitutional: No distress.  Neck: JVD present.  Cardiovascular: Normal rate and regular rhythm.   Respiratory: No respiratory distress. She has wheezes.  GI: She exhibits no distension. There is no tenderness.  Musculoskeletal: She exhibits no edema.  Neurological: She is alert.     Assessment/Plan: Problem #1 acute kidney injury superimposed on chronic: Possibly prerenal syndrome. Patient has recurrent problem of poor by mouth intake and comes to the hospital with similar issues. Last time her BUN and creatinine was very high improved and creatinine came down to 3.17 before her discharge which was her baseline. Presently patient is eating her dinner. No nausea or vomiting. Problem #2 difficulty breathing possibly associated with her pneumonia. Patient has right lower lobe infiltrate with some cough. Problem #3 chronic renal failure: Stage IV. Her creatinine on 8/18 2016 was 3.17 EGFR is 15 mL/m. Etiology was thought to be secondary to diabetes/hypertension/recurrent acute kidney injury Problem #4 hypertension: Her blood pressure is reasonably controlled Problem #5 diabetes Problem #6 hyperkalemia Problem #7 anemia Problem #8 low CO2: Possibly metabolic. Plan: 1]We'll start patient on 1/4 saline with 50 mEq of sodium bicarbonate at 100 mL per hour 2] will start patient also on Lasix 60 mg IV twice a day 3] we'll check her basic metabolic panel in the morning.  [name],[name] 08/13/2015, 7:06 PM

## 2015-08-13 NOTE — ED Notes (Signed)
Pt's daughter reports [facility] to [facility] but was sob and has generalized weakness.  Daughter says sob started this morning but weakness started Sunday.

## 2015-08-14 ENCOUNTER — Ambulatory Visit: Payer: Self-pay | Admitting: *Deleted

## 2015-08-14 LAB — COMPREHENSIVE METABOLIC PANEL
ALK PHOS: 84 U/L (ref 38–126)
ALT: 15 U/L (ref 14–54)
ANION GAP: 5 (ref 5–15)
AST: 22 U/L (ref 15–41)
Albumin: 3.3 g/dL — ABNORMAL LOW (ref 3.5–5.0)
BUN: 104 mg/dL — ABNORMAL HIGH (ref 6–20)
CALCIUM: 8.7 mg/dL — AB (ref 8.9–10.3)
CO2: 17 mmol/L — AB (ref 22–32)
Chloride: 124 mmol/L — ABNORMAL HIGH (ref 101–111)
Creatinine, Ser: 4.61 mg/dL — ABNORMAL HIGH (ref 0.44–1.00)
GFR calc non Af Amer: 9 mL/min — ABNORMAL LOW (ref >60)
GFR, EST AFRICAN AMERICAN: 10 mL/min — AB (ref >60)
Glucose, Bld: 97 mg/dL (ref 65–99)
POTASSIUM: 5.7 mmol/L — AB (ref 3.5–5.1)
SODIUM: 146 mmol/L — AB (ref 135–145)
TOTAL PROTEIN: 8.2 g/dL — AB (ref 6.5–8.1)
Total Bilirubin: 0.7 mg/dL (ref 0.3–1.2)

## 2015-08-14 LAB — URINALYSIS, ROUTINE W REFLEX MICROSCOPIC
Bilirubin Urine: NEGATIVE
GLUCOSE, UA: NEGATIVE mg/dL
Hgb urine dipstick: NEGATIVE
Ketones, ur: NEGATIVE mg/dL
Nitrite: NEGATIVE
PH: 5.5 (ref 5.0–8.0)
Protein, ur: NEGATIVE mg/dL
Specific Gravity, Urine: 1.015 (ref 1.005–1.030)
Urobilinogen, UA: 0.2 mg/dL (ref 0.0–1.0)

## 2015-08-14 LAB — STREP PNEUMONIAE URINARY ANTIGEN: Strep Pneumo Urinary Antigen: NEGATIVE

## 2015-08-14 LAB — URINE MICROSCOPIC-ADD ON

## 2015-08-14 LAB — EXPECTORATED SPUTUM ASSESSMENT W REFEX TO RESP CULTURE

## 2015-08-14 LAB — GLUCOSE, CAPILLARY
GLUCOSE-CAPILLARY: 103 mg/dL — AB (ref 65–99)
GLUCOSE-CAPILLARY: 120 mg/dL — AB (ref 65–99)
Glucose-Capillary: 92 mg/dL (ref 65–99)
Glucose-Capillary: 141 mg/dL — ABNORMAL HIGH (ref 65–99)

## 2015-08-14 LAB — CBC
HCT: 33.2 % — ABNORMAL LOW (ref 36.0–46.0)
Hemoglobin: 10 g/dL — ABNORMAL LOW (ref 12.0–15.0)
MCH: 27.3 pg (ref 26.0–34.0)
MCHC: 30.1 g/dL (ref 30.0–36.0)
MCV: 90.7 fL (ref 78.0–100.0)
PLATELETS: 182 10*3/uL (ref 150–400)
RBC: 3.66 MIL/uL — ABNORMAL LOW (ref 3.87–5.11)
RDW: 20.4 % — AB (ref 11.5–15.5)
WBC: 4.1 10*3/uL (ref 4.0–10.5)

## 2015-08-14 LAB — EXPECTORATED SPUTUM ASSESSMENT W GRAM STAIN, RFLX TO RESP C

## 2015-08-14 LAB — TSH: TSH: 7.573 u[IU]/mL — ABNORMAL HIGH (ref 0.350–4.500)

## 2015-08-14 LAB — MAGNESIUM: MAGNESIUM: 2.2 mg/dL (ref 1.7–2.4)

## 2015-08-14 MED ORDER — LEVETIRACETAM 100 MG/ML PO SOLN
500.0000 mg | Freq: Two times a day (BID) | ORAL | Status: DC
  Administered 2015-08-14: 500 mg via ORAL
  Filled 2015-08-14 (×7): qty 5

## 2015-08-14 NOTE — Progress Notes (Signed)
Respiratory assisted with collection of sputum specimen by deep suctioning patient's trachea.  Patient tolerated procedure well.  Specimen collected and taken to lab.

## 2015-08-14 NOTE — Progress Notes (Signed)
Placed 14 french foley,peri care provided prior to insertion,yellow urine returned to foley drainage bag,patient tolerated procedure. Present at bedside [name]  Dildy LPN. Foley care provided,will continue to monitor patient.[name]

## 2015-08-14 NOTE — Progress Notes (Addendum)
Subjective: Patient offer no complaint  Objective: Vital signs in last 24 hours: Temp:  [97.4 F (36.3 C)-98 F (36.7 C)] 97.4 F (36.3 C) (09/20 0528) Pulse Rate:  [51-66] 64 (09/19 1700) Resp:  [20-28] 20 (09/20 0528) BP: (123-153)/(46-78) 136/71 mmHg (09/20 1015) SpO2:  [95 %-100 %] 100 % (09/20 0528) Weight:  [139 lb (63.05 kg)-139 lb 8.8 oz (63.3 kg)] 139 lb 8.8 oz (63.3 kg) (09/19 1742)  Intake/Output from previous day:   Intake/Output this shift: Total I/O In: 120 [P.O.:120] Out: -    Recent Labs  08/13/15 1328 08/14/15 0613  HGB 9.7* 10.0*    Recent Labs  08/13/15 1328 08/14/15 0613  WBC 4.8 4.1  RBC 3.61* 3.66*  HCT 32.9* 33.2*  PLT 186 182    Recent Labs  08/13/15 1328 08/14/15 0613  NA 145 146*  K 5.8* 5.7*  CL 123* 124*  CO2 15* 17*  BUN 90* 104*  CREATININE 4.78* 4.61*  GLUCOSE 134* 97  CALCIUM 8.7* 8.7*   No results for input(s): LABPT, INR in the last 72 hours.  Generally patient is alert and in no apparent distress Chest she has some expiratory wheezing Heart exam revealed regular rate and rhythm Extremities no edema  Assessment/Plan: Problem #1 acute kidney injury superimposed on chronic. Etiology was thought to be secondary to prerenal syndrome versus ATN. Her BUN is high but creatinine is improving. Her urine out put is not documented because of urine incontinence Problem #2 chronic renal failure: Thought to be secondary to diabetes/hypertension/recurrent acute kidney injury/ischemic. She is stage IV chronic renal failure. Problem #3 pneumonia Problem #4 anemia: Her hemoglobin is stable. Problem #5 metabolic bone disease her calcium  is in range. Problem #6 metabolic acidosis: Patient is on sodium bicarbonate her CO2 is low but improving Problem #7 hyperkalemia: Potassium has improved Problem#8 Hypertension : Her blood pressure is reasonably controlled Problem#9 Hypernatremia; Possibly from lack of free water Plan: 1] We will  put folly catheter and documented her input and out put 2 we'll check her basic metabolic panel in the morning.   Sheryll Dymek S 08/14/2015, 12:09 PM
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
## 2015-08-14 NOTE — Progress Notes (Signed)
[facility] called and reported that patient had 6 beat run of vtach.  Telemetry strip checked and confirmed by RN.  MD notified.

## 2015-08-14 NOTE — Care Management Note (Signed)
Case Management Note  Patient Details  Name: NADENE WITHERSPOON MRN: [id number] Date of Birth: 1940-12-17  Expected Discharge Date:    08/18/2015              Expected Discharge Plan:  [place]  In-House Referral:  NA  Discharge planning Services  CM Consult  Post Acute Care Choice:  Resumption of Svcs/PTA Provider Choice offered to:  Adult Children, HC POA / Guardian  DME Arranged:    DME Agency:     HH Arranged:  RN [place] Agency:  [place]  Status of Service:  In process, will continue to follow  Medicare Important Message Given:    Date Medicare IM Given:    Medicare IM give by:    Date Additional Medicare IM Given:    Additional Medicare Important Message give by:     If discussed at [place] of Stay Meetings, dates discussed:    Additional Comments: Pt admitted with HCAP. Pt is from home, lives with daughter who is also her CAP aid. Pt active with [facility] for RN services. [name], of [facility], aware of admission and will obtain pt info from chart. PT has home O2 and all needed DME. CM will cont to follow for DC planning.   Sherald Barge, RN 08/14/2015, 2:23 PM

## 2015-08-14 NOTE — Progress Notes (Addendum)
[facility] PROGRESS NOTE  [name] BJS:[id number] DOB: [birth year] DOA: 08/13/2015 PCP: [name], MD  Assessment/Plan: 1. Healthcare associated pneumonia. Right lower lobe per xray in setting of chronic respiratory failure. Concern chronic aspiration as she has a history of choking episodes and in view of her dementia. Await swallowing evaluation. She is afebrile and non-toxic appearing . Await strep pneumonia and legionella as able. Oxygen saturation level >90% on 2 L. Continue Fortaz and Vancomycin day #2. Continue inhaler 2. Acute renal failure super-imposed on stage 4 chronic kidney disease. Creatinine slightly higher than baseline. Evaluated by nephrology who opine likely prerenal syndrome and adjusted IV fluids and started lasix BID.  Only slight improvement in labs. Continue IV lasix. monitor 3. Hyperkalemia.  Related to #2 and supplements. Little improvement this am. Have discontinued  oral potassium. Continue to monitor closely. If no improvement will consider  4. Diabetes. CBG 94-141. HgA1c 06/2015 6.2.  Continue with home medications and sliding scale of insulin. 5. Dementia. Appears to be stable and at baseline. Continue with Aricept. 6. History of chronic systolic heart failure.  ProBNP >4500. Chest xray with mild vascular congestion.  Echocardiogram just a month ago shows ejection fraction 55-60% with evidence of diastolic dysfunction. Continue IV lasix per nephrology 7. Anemia of chronic disease: stable. Seen by heme 08/13/15. On ESA therapy  Code Status: full Family Communication: none present Disposition Plan:    Consultants:  nephrology  Procedures:  none  Antibiotics:  fortaz 08/13/15>>  Vancomycin 08/13/15>>  HPI/Subjective: Awake alert. Denies pain/discomfort.   Objective: Filed Vitals:   08/14/15 1015  BP: 136/71  Pulse:   Temp:   Resp:     Intake/Output Summary (Last 24 hours) at 08/14/15 1148 Last data filed at 08/14/15 0845  Gross  per 24 hour  Intake    120 ml  Output      0 ml  Net    120 ml   Filed Weights   08/13/15 1245 08/13/15 1742  Weight: 63.05 kg (139 lb) 63.3 kg (139 lb 8.8 oz)    Exam:   General:  Calm cooperative appears comfortable  Cardiovascular: rrr no MGR no LE edema +JVD  Respiratory: mild increased work of breathing, BS with fair air flow fine crackles no wheeze  Abdomen: non-distended non-tender +BS   Musculoskeletal: joints without swelling/erythema   Data Reviewed: Basic Metabolic Panel:  Recent Labs Lab 08/13/15 1328 08/14/15 0613  NA 145 146*  K 5.8* 5.7*  CL 123* 124*  CO2 15* 17*  GLUCOSE 134* 97  BUN 90* 104*  CREATININE 4.78* 4.61*  CALCIUM 8.7* 8.7*  MG  --  2.2   Liver Function Tests:  Recent Labs Lab 08/14/15 0613  AST 22  ALT 15  ALKPHOS 84  BILITOT 0.7  PROT 8.2*  ALBUMIN 3.3*   No results for input(s): LIPASE, AMYLASE in the last 168 hours. No results for input(s): AMMONIA in the last 168 hours. CBC:  Recent Labs Lab 08/13/15 1328 08/14/15 0613  WBC 4.8 4.1  NEUTROABS 3.5  --   HGB 9.7* 10.0*  HCT 32.9* 33.2*  MCV 91.1 90.7  PLT 186 182   Cardiac Enzymes:  Recent Labs Lab 08/13/15 1328 08/13/15 1519  TROPONINI 0.06* 0.05*   BNP (last 3 results)  Recent Labs  05/28/15 1148 07/03/15 1514 08/13/15 1328  BNP 1084.0* 3737.0* >4500.0*    ProBNP (last 3 results) No results for input(s): PROBNP in the last 8760 hours.  CBG:  Recent Labs  Lab 08/13/15 2146 08/14/15 0743 08/14/15 1116  GLUCAP 116* 92 141*    Recent Results (from the past 240 hour(s))  Culture, blood (routine x 2)     Status: None (Preliminary result)   Collection Time: 08/13/15  1:50 PM  Result Value Ref Range Status   Specimen Description BLOOD RIGHT ANTECUBITAL  Final   Special Requests BOTTLES DRAWN AEROBIC AND ANAEROBIC 5CC EACH  Final   Culture NO GROWTH < 24 HOURS  Final   Report Status PENDING  Incomplete  Culture, blood (routine x 2)      Status: None (Preliminary result)   Collection Time: 08/13/15  2:00 PM  Result Value Ref Range Status   Specimen Description BLOOD LEFT ANTECUBITAL  Final   Special Requests BOTTLES DRAWN AEROBIC AND ANAEROBIC 5CC EACH  Final   Culture NO GROWTH < 24 HOURS  Final   Report Status PENDING  Incomplete     Studies: Dg Chest Portable 1 View  08/13/2015   CLINICAL DATA:  Shortness of breath  EXAM: PORTABLE CHEST - 1 VIEW  COMPARISON:  07/06/2015  FINDINGS: Moderate to severe cardiac enlargement. Patient is status post prior CABG. Mild vascular congestion. Left lung appears clear. Right diaphragm is elevated similar to prior study. There is mild hazy density in the right lower lobe as well as blunting of the right costophrenic angle.  IMPRESSION: Right lower lobe infiltrate with small right pleural effusion. Pneumonia/pneumonitis suspected.   Electronically Signed   By: Skipper Cliche M.D.   On: 08/13/2015 13:21    Scheduled Meds: . sodium chloride   Intravenous STAT  . calcitRIOL  0.25 mcg Oral Daily  . [START ON 08/15/2015] cefTAZidime (FORTAZ)  IV  2 g Intravenous Q48H  . donepezil  10 mg Oral QHS  . enoxaparin (LOVENOX) injection  30 mg Subcutaneous Q24H  . folic acid  1 mg Oral q morning - 10a  . furosemide  60 mg Intravenous BID  . hydrALAZINE  75 mg Oral 3 times per day  . insulin aspart  0-5 Units Subcutaneous QHS  . insulin aspart  0-9 Units Subcutaneous TID WC  . insulin detemir  4 Units Subcutaneous QHS  . isosorbide mononitrate  15 mg Oral q morning - 10a  . levETIRAcetam  500 mg Oral BID  . linagliptin  5 mg Oral q morning - 10a  . metoprolol succinate  50 mg Oral Daily  . mupirocin ointment  1 application Topical BID  . pantoprazole  40 mg Oral q morning - 10a  . pravastatin  20 mg Oral QHS  . RESOURCE THICKENUP CLEAR  1 Can Oral Daily  . torsemide  20 mg Oral Daily   Continuous Infusions: .  sodium bicarbonate  infusion 1000 mL 100 mL/hr at 08/14/15 6195    Principal  Problem:   HCAP (healthcare-associated pneumonia) Active Problems:   Hypertension   Diabetes mellitus   Seizure disorder   Iron deficiency anemia   Dementia   Severe mitral regurgitation   Cardiomyopathy, ischemic   Acute renal failure superimposed on stage 4 chronic kidney disease   Chronic combined systolic and diastolic CHF (congestive heart failure)   Anemia in chronic kidney disease (CKD)   Healthcare-associated pneumonia    Time spent: 35 minutes    [facility] Pager [phone]. If 7PM-7AM, please contact night-coverage at [email or web address], password [facility] 08/14/2015, 11:48 AM  LOS: 1 day     I have directly reviewed the clinical findings, lab results  and imaging studies. I have interviewed and examined the patient and agree with the documentation and management as recorded by the Physician extender.  Very frail elderly female with baseline dementia, swallow eval pending, may need SNF.  Addendum: did not pass swallow eval, made npo except meds (cursh meds), repeat swallow eval tomorrow. Discussed with patient about feeding tube if fail swallow eval, she is going to think about it.  Xu,Fang M.D on 08/14/2015 at 12:28 PM  [facility] Office  [phone]

## 2015-08-14 NOTE — Progress Notes (Signed)
In and out catheter ordered to collect lab specimen.  Procedure done by Julious Oka assisted by Nicole Cella.  100 ml of clear yelllow urine was drained with the catheter.  Patient tolerated procedure well.  Specimen taken to lab.

## 2015-08-14 NOTE — Progress Notes (Signed)
Patient had a 6 beat run of vtach.  MD notified.  Patient aymptomatic. Vital Signs are stable.

## 2015-08-15 ENCOUNTER — Inpatient Hospital Stay (HOSPITAL_COMMUNITY): Payer: Medicare Other

## 2015-08-15 ENCOUNTER — Encounter (HOSPITAL_COMMUNITY)
Admission: RE | Admit: 2015-08-15 | Discharge: 2015-08-15 | Disposition: A | Discharge status: Home / Self Care | Payer: Medicare Other | Source: Ambulatory Visit | Attending: Nephrology | Admitting: Nephrology

## 2015-08-15 DIAGNOSIS — G40909 Epilepsy, unspecified, not intractable, without status epilepticus: Secondary | ICD-10-CM

## 2015-08-15 DIAGNOSIS — D631 Anemia in chronic kidney disease: Secondary | ICD-10-CM

## 2015-08-15 DIAGNOSIS — F0390 Unspecified dementia without behavioral disturbance: Secondary | ICD-10-CM

## 2015-08-15 DIAGNOSIS — N179 Acute kidney failure, unspecified: Secondary | ICD-10-CM

## 2015-08-15 DIAGNOSIS — I10 Essential (primary) hypertension: Secondary | ICD-10-CM

## 2015-08-15 DIAGNOSIS — I5042 Chronic combined systolic (congestive) and diastolic (congestive) heart failure: Secondary | ICD-10-CM

## 2015-08-15 DIAGNOSIS — N189 Chronic kidney disease, unspecified: Secondary | ICD-10-CM

## 2015-08-15 DIAGNOSIS — N184 Chronic kidney disease, stage 4 (severe): Secondary | ICD-10-CM

## 2015-08-15 DIAGNOSIS — J690 Pneumonitis due to inhalation of food and vomit: Principal | ICD-10-CM

## 2015-08-15 LAB — CBC
HCT: 32.5 % — ABNORMAL LOW (ref 36.0–46.0)
Hemoglobin: 10 g/dL — ABNORMAL LOW (ref 12.0–15.0)
MCH: 27.9 pg (ref 26.0–34.0)
MCHC: 30.8 g/dL (ref 30.0–36.0)
MCV: 90.8 fL (ref 78.0–100.0)
PLATELETS: 174 10*3/uL (ref 150–400)
RBC: 3.58 MIL/uL — ABNORMAL LOW (ref 3.87–5.11)
RDW: 20.5 % — AB (ref 11.5–15.5)
WBC: 4.1 10*3/uL (ref 4.0–10.5)

## 2015-08-15 LAB — GLUCOSE, CAPILLARY
Glucose-Capillary: 95 mg/dL (ref 65–99)
Glucose-Capillary: 87 mg/dL (ref 65–99)
Glucose-Capillary: 93 mg/dL (ref 65–99)
Glucose-Capillary: 183 mg/dL — ABNORMAL HIGH (ref 65–99)

## 2015-08-15 LAB — PHOSPHORUS: Phosphorus: 4.9 mg/dL — ABNORMAL HIGH (ref 2.5–4.6)

## 2015-08-15 LAB — LEGIONELLA ANTIGEN, URINE

## 2015-08-15 LAB — BASIC METABOLIC PANEL
Anion gap: 7 (ref 5–15)
BUN: 102 mg/dL — AB (ref 6–20)
CALCIUM: 8.1 mg/dL — AB (ref 8.9–10.3)
CO2: 16 mmol/L — AB (ref 22–32)
Chloride: 117 mmol/L — ABNORMAL HIGH (ref 101–111)
Creatinine, Ser: 4.63 mg/dL — ABNORMAL HIGH (ref 0.44–1.00)
GFR calc Af Amer: 10 mL/min — ABNORMAL LOW (ref >60)
GFR, EST NON AFRICAN AMERICAN: 8 mL/min — AB (ref >60)
GLUCOSE: 99 mg/dL (ref 65–99)
Potassium: 5.4 mmol/L — ABNORMAL HIGH (ref 3.5–5.1)
Sodium: 140 mmol/L (ref 135–145)

## 2015-08-15 LAB — VANCOMYCIN, RANDOM: Vancomycin Rm: 14 ug/mL

## 2015-08-15 MED ORDER — LEVETIRACETAM 500 MG/5ML IV SOLN
500.0000 mg | Freq: Two times a day (BID) | INTRAVENOUS | Status: DC
  Filled 2015-08-15 (×3): qty 5

## 2015-08-15 MED ORDER — METOPROLOL TARTRATE 1 MG/ML IV SOLN
5.0000 mg | Freq: Four times a day (QID) | INTRAVENOUS | Status: DC
  Administered 2015-08-15 – 2015-08-20 (×20): 5 mg via INTRAVENOUS
  Filled 2015-08-15 (×20): qty 5

## 2015-08-15 MED ORDER — PANTOPRAZOLE SODIUM 40 MG IV SOLR
40.0000 mg | INTRAVENOUS | Status: DC
  Administered 2015-08-15 – 2015-08-19 (×5): 40 mg via INTRAVENOUS
  Filled 2015-08-15 (×5): qty 40

## 2015-08-15 MED ORDER — SODIUM BICARBONATE 650 MG PO TABS
650.0000 mg | ORAL_TABLET | Freq: Three times a day (TID) | ORAL | Status: DC
  Administered 2015-08-15 – 2015-08-20 (×15): 650 mg via ORAL
  Filled 2015-08-15 (×14): qty 1

## 2015-08-15 MED ORDER — VANCOMYCIN HCL IN DEXTROSE 1-5 GM/200ML-% IV SOLN
1000.0000 mg | Freq: Once | INTRAVENOUS | Status: AC
  Administered 2015-08-15: 1000 mg via INTRAVENOUS
  Filled 2015-08-15: qty 200

## 2015-08-15 MED ORDER — LEVETIRACETAM IN NACL 500 MG/100ML IV SOLN
500.0000 mg | Freq: Two times a day (BID) | INTRAVENOUS | Status: DC
  Administered 2015-08-15 – 2015-08-19 (×10): 500 mg via INTRAVENOUS
  Filled 2015-08-15 (×13): qty 100
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
## 2015-08-15 NOTE — Progress Notes (Signed)
ANTIBIOTIC CONSULT NOTE - INITIAL  Pharmacy Consult for vancomycin Indication: pneumonia  No Known Allergies  Patient Measurements: Height: 5\' 4"  (162.6 cm) Weight: 139 lb 8.8 oz (63.3 kg) IBW/kg (Calculated) : 54.7   Vital Signs: Temp: 97 F (36.1 C) (09/21 0600) Temp Source: Oral (09/21 0600) BP: 145/70 mmHg (09/21 1200) Pulse Rate: 56 (09/21 0600) Intake/Output from previous day: 09/20 0701 - 09/21 0700 In: 3908.3 [P.O.:600; I.V.:3308.3] Out: 800 [Urine:800] Intake/Output from this shift:    Labs:  Recent Labs  08/13/15 1328 08/14/15 0613 08/15/15 0729  WBC 4.8 4.1 4.1  HGB 9.7* 10.0* 10.0*  PLT 186 182 174  CREATININE 4.78* 4.61* 4.63*   Estimated Creatinine Clearance: 9.2 mL/min (by C-G formula based on Cr of 4.63).  Recent Labs  08/15/15 0729  [facility] 14     Microbiology: Recent Results (from the past 720 hour(s))  Culture, blood (routine x 2)     Status: None (Preliminary result)   Collection Time: 08/13/15  1:50 PM  Result Value Ref Range Status   Specimen Description BLOOD RIGHT ANTECUBITAL  Final   Special Requests BOTTLES DRAWN AEROBIC AND ANAEROBIC 5CC EACH  Final   Culture NO GROWTH 2 DAYS  Final   Report Status PENDING  Incomplete  Culture, blood (routine x 2)     Status: None (Preliminary result)   Collection Time: 08/13/15  2:00 PM  Result Value Ref Range Status   Specimen Description BLOOD LEFT ANTECUBITAL  Final   Special Requests BOTTLES DRAWN AEROBIC AND ANAEROBIC 5CC EACH  Final   Culture NO GROWTH 2 DAYS  Final   Report Status PENDING  Incomplete  Culture, sputum-assessment     Status: None   Collection Time: 08/14/15  4:00 AM  Result Value Ref Range Status   Specimen Description TRACHEAL ASPIRATE  Final   Special Requests NONE  Final   Sputum evaluation   Final    THIS SPECIMEN IS ACCEPTABLE. RESPIRATORY CULTURE REPORT TO FOLLOW. Performed at [facility]    Report Status 08/14/2015 FINAL  Final  Culture,  respiratory (NON-Expectorated)     Status: None (Preliminary result)   Collection Time: 08/14/15  4:00 AM  Result Value Ref Range Status   Specimen Description TRACHEAL ASPIRATE  Final   Special Requests NONE  Final   Gram Stain   Final    RARE WBC PRESENT, PREDOMINANTLY PMN ABUNDANT SQUAMOUS EPITHELIAL CELLS PRESENT FEW GRAM POSITIVE COCCI IN PAIRS IN CHAINS RARE GRAM NEGATIVE RODS Performed at [facility]    Culture   Final    NORMAL OROPHARYNGEAL FLORA Performed at [facility]    Report Status PENDING  Incomplete    Medical History: Past Medical History  Diagnosis Date  . Type 2 diabetes mellitus   . Essential hypertension, benign   . History of stroke     Previously on Coumadin  . MI, old     Reported 21  . Seizure disorder   . Coronary atherosclerosis of native coronary artery     Multivessel status post CABG 2002  . History of GI bleed     Erosive gastritis and duodenitis by EGD 2002  . Mixed hyperlipidemia   . Ischemic cardiomyopathy     LVEF 40-45% March 2013  . Dementia   . CKD (chronic kidney disease) stage 4, GFR 15-29 ml/min   . CHF (congestive heart failure)   . Chronic respiratory failure with hypoxia   . Stroke   . Seizures   .  Anemia of chronic renal failure, stage 4 (severe) 01/02/2015    Medications:  See medication history Assessment: 74 yo lady to start broad spectrum antibiotics for PNA.  She has a h/o CKD with CrCl~9 ml/min. Vancomycin trough level 27mcg/ml, will redose and give 1000 mg iv today. Goal of Therapy:  Vancomycin trough level 15-20 mcg/ml  Plan:  Vancomycin 1000mg  IV x 1 today Will check random level in ~48 hours for redose. Continue fortaz dose to 2 gm IV q48 hours. F/u renal function, cultures and clinical course  Thanks for allowing pharmacy to be a part of this patient's care. Isac Sarna, BS Vena [place], BCPS Clinical Pharmacist Pager [phone]  08/15/2015,12:34 PM

## 2015-08-15 NOTE — Progress Notes (Signed)
[name]  MRN: [id number]  DOB/AGE: 1941-01-17 74 y.o.  Primary Care Physician:HILL,GERALD K, MD  Admit date: 08/13/2015  Chief Complaint:  Chief Complaint  Patient presents with  . Shortness of Breath  . Weakness    S-Pt presented on  08/13/2015 with  Chief Complaint  Patient presents with  . Shortness of Breath  . Weakness  .    Pt offers no complaints.   Meds . calcitRIOL  0.25 mcg Oral Daily  . cefTAZidime (FORTAZ)  IV  2 g Intravenous Q48H  . donepezil  10 mg Oral QHS  . enoxaparin (LOVENOX) injection  30 mg Subcutaneous Q24H  . folic acid  1 mg Oral q morning - 10a  . furosemide  60 mg Intravenous BID  . hydrALAZINE  75 mg Oral 3 times per day  . insulin aspart  0-5 Units Subcutaneous QHS  . insulin aspart  0-9 Units Subcutaneous TID WC  . insulin detemir  4 Units Subcutaneous QHS  . isosorbide mononitrate  15 mg Oral q morning - 10a  . levETIRAcetam  500 mg Oral BID  . linagliptin  5 mg Oral q morning - 10a  . metoprolol succinate  50 mg Oral Daily  . mupirocin ointment  1 application Topical BID  . pantoprazole  40 mg Oral q morning - 10a  . pravastatin  20 mg Oral QHS  . RESOURCE THICKENUP CLEAR  1 Can Oral Daily  . torsemide  20 mg Oral Daily      Physical Exam: Vital signs in last 24 hours: Temp:  [97 F (36.1 C)-97.1 F (36.2 C)] 97 F (36.1 C) (09/21 0600) Pulse Rate:  [56-61] 56 (09/21 0600) Resp:  [20] 20 (09/21 0600) BP: (136-149)/(61-71) 149/61 mmHg (09/20 2243) SpO2:  [100 %] 100 % (09/21 0600) Weight change:  Last BM Date: 08/14/15  Intake/Output from previous day: 09/20 0701 - 09/21 0700 In: 3908.3 [P.O.:600; I.V.:3308.3] Out: 800 [Urine:800]     Physical Exam: General- pt is lethargic but arousable Resp- No acute REsp distress,clear to auscultation. CVS- S1S2 regular in rate and rhythm GIT- BS+, soft, NT, ND EXT- trace LE Edema, No Cyanosis   Lab Results: CBC  Recent Labs  08/14/15 0613 08/15/15 0729  WBC 4.1  4.1  HGB 10.0* 10.0*  HCT 33.2* 32.5*  PLT 182 174    BMET  Recent Labs  08/14/15 0613 08/15/15 0729  NA 146* 140  K 5.7* 5.4*  CL 124* 117*  CO2 17* 16*  GLUCOSE 97 99  BUN 104* 102*  CREATININE 4.61* 4.63*  CALCIUM 8.7* 8.1*    Creat trend  2016  4.78=>4.61--4.63           4.47=>4.59=>3.85=>3.61=>3.32 ( August admission)  7.16=>3.0( July admission)  4.93=>3.17( June admission)  4.29=>3.64=>4.10=>3.95 ( Jan/Feb admission) 2015 2.0--3.75( Multiple admission) 2014 1.4--2.5 20131.5--1.7 20121.26   MICRO Recent Results (from the past 240 hour(s))  Culture, blood (routine x 2)     Status: None (Preliminary result)   Collection Time: 08/13/15  1:50 PM  Result Value Ref Range Status   Specimen Description BLOOD RIGHT ANTECUBITAL  Final   Special Requests BOTTLES DRAWN AEROBIC AND ANAEROBIC 5CC EACH  Final   Culture NO GROWTH < 24 HOURS  Final   Report Status PENDING  Incomplete  Culture, blood (routine x 2)     Status: None (Preliminary result)   Collection Time: 08/13/15  2:00 PM  Result Value Ref Range Status   Specimen Description BLOOD  LEFT ANTECUBITAL  Final   Special Requests BOTTLES DRAWN AEROBIC AND ANAEROBIC 5CC EACH  Final   Culture NO GROWTH < 24 HOURS  Final   Report Status PENDING  Incomplete  Culture, sputum-assessment     Status: None   Collection Time: 08/14/15  4:00 AM  Result Value Ref Range Status   Specimen Description TRACHEAL ASPIRATE  Final   Special Requests NONE  Final   Sputum evaluation   Final    THIS SPECIMEN IS ACCEPTABLE. RESPIRATORY CULTURE REPORT TO FOLLOW. Performed at [facility]    Report Status 08/14/2015 FINAL  Final  Culture, respiratory (NON-Expectorated)     Status: None (Preliminary result)   Collection Time: 08/14/15  4:00 AM  Result Value Ref Range Status   Specimen Description TRACHEAL ASPIRATE  Final   Special Requests NONE  Final   Gram Stain   Final    RARE WBC  PRESENT, PREDOMINANTLY PMN ABUNDANT SQUAMOUS EPITHELIAL CELLS PRESENT FEW GRAM POSITIVE COCCI IN PAIRS IN CHAINS RARE GRAM NEGATIVE RODS Performed at [facility]    Culture   Final    NORMAL OROPHARYNGEAL FLORA Performed at [facility]    Report Status PENDING  Incomplete      Lab Results  Component Value Date   PTH 130* 07/23/2015   CALCIUM 8.1* 08/15/2015   PHOS 4.9* 08/15/2015         Impression: 1)Renal AKI sec to ATN  AKI on CKD                AKi improving                 Creat near to baseline  CKD stage 4 .  Creat at baseline  CKD since 2013  CKD secondary to DM/HTN/Age associated decline  Progression of CKD marked with multiple AKI in 2015 and now in 2016   2)HTN  Medication- On Diuretics. On Beta blockers On Vasodilators  3)Anemia HGb at goal (9--11) On ESA  4)CKD Mineral-Bone Disorder PTH elevated. Secondary Hyperparathyroidism present. Phosphorus at goal.    5)Electrolytes Hyperkalemic       better  Hypernatremic       better  6)Acid base Co2 not at goal    7)ID- admitted with HCAP on IV ABX, primary team follwoing   Plan:  Will start PO bicarb    BHUTANI,MANPREET S 08/15/2015, 9:16 AM

## 2015-08-15 NOTE — Progress Notes (Signed)
[facility] PROGRESS NOTE  [name] PNT:[id number] DOB: [birth year] DOA: 08/13/2015 PCP: [name], MD  Assessment/Plan:  Aspiration pneumonia. Right lower lobe per xray in setting of chronic respiratory failure. Concern for chronic aspiration as she has a history of choking episodes and in view of her dementia. Speech therapy has conducted a swallow evaluation and recommends NPO expect meds, which are to be crushed with puree only. Remains afebrile and non-toxic appearing. Blood cultures show no growth in two days. Sputum culture is unremarkable. Strep pneumonia is negative and legionella is in process. Oxygen saturation stable 2L. Continue Fortaz and Vancomycin. Continue inhaler. acute metabolic encephalopathy superimposed on baseline dementia. Pt appears to be more lethargic will keep NPO to avoid any further aspiration. Continue current treatments.   Acute renal failure super-imposed on CKD Stage IV. Creatinine remains slightly higher than baseline. Evaluated by nephrology who believes it is  likely prerenal syndrome and adjusted IV fluids and started lasix BID.Foley catheter placed to monitor I&Os.    Hyperkalemia. Related to #2 and supplements. Slight improvement. Oral potassium discontinued. Continue to monitor closely.  Diabetes. Blood sugars are stable. Continue sliding scale of insulin.  Dementia. Appears to be stable and at baseline. Continue with Aricept.  History of chronic systolic heart failure. ProBNP >4500. Chest xray with mild vascular congestion. Echocardiogram just a month ago shows ejection fraction 55-60% with evidence of diastolic dysfunction. Continue IV lasix per nephrology  Anemia of chronic disease: stable. Seen by heme 08/13/15. On ESA therapy.  Seizure disorder. Continue Keppra.  Code Status: DNR DVT prophylaxis:Lovenox Family Communication: No family bedside. Discussed with daughter [name] over the phone. She is agreeable for palliative care  consult to discuss goals of care. Disposition Plan: Discharge within 1-2 days.    Consultants:  Nephrology   Speech Therapy   Procedures:  None  Antibiotics:  fortaz 08/13/15>>  Vancomycin 08/13/15>>  HPI/Subjective: Lethargic did not participate during visit.   Objective: Filed Vitals:   08/15/15 0600  BP:   Pulse: 56  Temp: 97 F (36.1 C)  Resp: 20    Intake/Output Summary (Last 24 hours) at 08/15/15 0900 Last data filed at 08/15/15 0649  Gross per 24 hour  Intake 3788.34 ml  Output    800 ml  Net 2988.34 ml   Filed Weights   08/13/15 1245 08/13/15 1742  Weight: 63.05 kg (139 lb) 63.3 kg (139 lb 8.8 oz)    Exam:  General: NAD, looks comfortable  Cardiovascular: RRR, S1, S2   Respiratory: Course breath sounds bilaterally.  Abdomen: soft, non tender, no distention   Musculoskeletal: No edema b/l   Data Reviewed: Basic Metabolic Panel:  Recent Labs Lab 08/13/15 1328 08/14/15 0613 08/15/15 0729  NA 145 146* 140  K 5.8* 5.7* 5.4*  CL 123* 124* 117*  CO2 15* 17* 16*  GLUCOSE 134* 97 99  BUN 90* 104* 102*  CREATININE 4.78* 4.61* 4.63*  CALCIUM 8.7* 8.7* 8.1*  MG  --  2.2  --   PHOS  --   --  4.9*   Liver Function Tests:  Recent Labs Lab 08/14/15 0613  AST 22  ALT 15  ALKPHOS 84  BILITOT 0.7  PROT 8.2*  ALBUMIN 3.3*   No results for input(s): LIPASE, AMYLASE in the last 168 hours. No results for input(s): AMMONIA in the last 168 hours. CBC:  Recent Labs Lab 08/13/15 1328 08/14/15 0613 08/15/15 0729  WBC 4.8 4.1 4.1  NEUTROABS 3.5  --   --  HGB 9.7* 10.0* 10.0*  HCT 32.9* 33.2* 32.5*  MCV 91.1 90.7 90.8  PLT 186 182 174   Cardiac Enzymes:  Recent Labs Lab 08/13/15 1328 08/13/15 1519  TROPONINI 0.06* 0.05*   BNP (last 3 results)  Recent Labs  05/28/15 1148 07/03/15 1514 08/13/15 1328  BNP 1084.0* 3737.0* >4500.0*    ProBNP (last 3 results) No results for input(s): PROBNP in the last 8760  hours.  CBG:  Recent Labs Lab 08/14/15 0743 08/14/15 1116 08/14/15 1623 08/14/15 2219 08/15/15 0734  GLUCAP 92 141* 103* 120* 95    Recent Results (from the past 240 hour(s))  Culture, blood (routine x 2)     Status: None (Preliminary result)   Collection Time: 08/13/15  1:50 PM  Result Value Ref Range Status   Specimen Description BLOOD RIGHT ANTECUBITAL  Final   Special Requests BOTTLES DRAWN AEROBIC AND ANAEROBIC 5CC EACH  Final   Culture NO GROWTH < 24 HOURS  Final   Report Status PENDING  Incomplete  Culture, blood (routine x 2)     Status: None (Preliminary result)   Collection Time: 08/13/15  2:00 PM  Result Value Ref Range Status   Specimen Description BLOOD LEFT ANTECUBITAL  Final   Special Requests BOTTLES DRAWN AEROBIC AND ANAEROBIC 5CC EACH  Final   Culture NO GROWTH < 24 HOURS  Final   Report Status PENDING  Incomplete  Culture, sputum-assessment     Status: None   Collection Time: 08/14/15  4:00 AM  Result Value Ref Range Status   Specimen Description TRACHEAL ASPIRATE  Final   Special Requests NONE  Final   Sputum evaluation   Final    THIS SPECIMEN IS ACCEPTABLE. RESPIRATORY CULTURE REPORT TO FOLLOW. Performed at [facility]    Report Status 08/14/2015 FINAL  Final  Culture, respiratory (NON-Expectorated)     Status: None (Preliminary result)   Collection Time: 08/14/15  4:00 AM  Result Value Ref Range Status   Specimen Description TRACHEAL ASPIRATE  Final   Special Requests NONE  Final   Gram Stain   Final    RARE WBC PRESENT, PREDOMINANTLY PMN ABUNDANT SQUAMOUS EPITHELIAL CELLS PRESENT FEW GRAM POSITIVE COCCI IN PAIRS IN CHAINS RARE GRAM NEGATIVE RODS Performed at [facility]    Culture PENDING  Incomplete   Report Status PENDING  Incomplete     Studies: Dg Chest Portable 1 View  08/13/2015   CLINICAL DATA:  Shortness of breath  EXAM: PORTABLE CHEST - 1 VIEW  COMPARISON:  07/06/2015  FINDINGS: Moderate to severe cardiac  enlargement. Patient is status post prior CABG. Mild vascular congestion. Left lung appears clear. Right diaphragm is elevated similar to prior study. There is mild hazy density in the right lower lobe as well as blunting of the right costophrenic angle.  IMPRESSION: Right lower lobe infiltrate with small right pleural effusion. Pneumonia/pneumonitis suspected.   Electronically Signed   By: Skipper Cliche M.D.   On: 08/13/2015 13:21    Scheduled Meds: . calcitRIOL  0.25 mcg Oral Daily  . cefTAZidime (FORTAZ)  IV  2 g Intravenous Q48H  . donepezil  10 mg Oral QHS  . enoxaparin (LOVENOX) injection  30 mg Subcutaneous Q24H  . folic acid  1 mg Oral q morning - 10a  . furosemide  60 mg Intravenous BID  . hydrALAZINE  75 mg Oral 3 times per day  . insulin aspart  0-5 Units Subcutaneous QHS  . insulin aspart  0-9 Units  Subcutaneous TID WC  . insulin detemir  4 Units Subcutaneous QHS  . isosorbide mononitrate  15 mg Oral q morning - 10a  . levETIRAcetam  500 mg Oral BID  . linagliptin  5 mg Oral q morning - 10a  . metoprolol succinate  50 mg Oral Daily  . mupirocin ointment  1 application Topical BID  . pantoprazole  40 mg Oral q morning - 10a  . pravastatin  20 mg Oral QHS  . RESOURCE THICKENUP CLEAR  1 Can Oral Daily  . torsemide  20 mg Oral Daily   Continuous Infusions: .  sodium bicarbonate  infusion 1000 mL 100 mL/hr at 08/15/15 6579    Principal Problem:   HCAP (healthcare-associated pneumonia) Active Problems:   Hypertension   Diabetes mellitus   Seizure disorder   Iron deficiency anemia   Dementia   Severe mitral regurgitation   Cardiomyopathy, ischemic   Acute renal failure superimposed on stage 4 chronic kidney disease   Chronic combined systolic and diastolic CHF (congestive heart failure)   Anemia in chronic kidney disease (CKD)   Healthcare-associated pneumonia    Time spent: 25 minutes     Kathie Dike, MD  [facility] Pager [phone]. If 7PM-7AM,  please contact night-coverage at [email or web address], password [facility] 08/15/2015, 9:00 AM  LOS: 2 days     By signing my name below, I, Rennis Harding, attest that this documentation has been prepared under the direction and in the presence of Kathie Dike, MD. Electronically signed: Rennis Harding, Scribe. 08/15/2015    I, Dr. Kathie Dike, personally performed the services described in this documentaiton. All medical record entries made by the scribe were at my direction and in my presence. I have reviewed the chart and agree that the record reflects my personal performance and is accurate and complete  Kathie Dike, MD, 08/15/2015 6:52 PM

## 2015-08-16 LAB — BASIC METABOLIC PANEL
ANION GAP: 6 (ref 5–15)
BUN: 95 mg/dL — ABNORMAL HIGH (ref 6–20)
CALCIUM: 8 mg/dL — AB (ref 8.9–10.3)
CO2: 21 mmol/L — ABNORMAL LOW (ref 22–32)
Chloride: 112 mmol/L — ABNORMAL HIGH (ref 101–111)
Creatinine, Ser: 4.15 mg/dL — ABNORMAL HIGH (ref 0.44–1.00)
GFR, EST AFRICAN AMERICAN: 11 mL/min — AB (ref >60)
GFR, EST NON AFRICAN AMERICAN: 10 mL/min — AB (ref >60)
Glucose, Bld: 83 mg/dL (ref 65–99)
Potassium: 4.7 mmol/L (ref 3.5–5.1)
SODIUM: 139 mmol/L (ref 135–145)

## 2015-08-16 LAB — CBC
HCT: 31.3 % — ABNORMAL LOW (ref 36.0–46.0)
HEMOGLOBIN: 9.8 g/dL — AB (ref 12.0–15.0)
MCH: 27.6 pg (ref 26.0–34.0)
MCHC: 31.3 g/dL (ref 30.0–36.0)
MCV: 88.2 fL (ref 78.0–100.0)
Platelets: 167 10*3/uL (ref 150–400)
RBC: 3.55 MIL/uL — AB (ref 3.87–5.11)
RDW: 20.3 % — ABNORMAL HIGH (ref 11.5–15.5)
WBC: 3.4 10*3/uL — AB (ref 4.0–10.5)

## 2015-08-16 LAB — GLUCOSE, CAPILLARY
GLUCOSE-CAPILLARY: 82 mg/dL (ref 65–99)
GLUCOSE-CAPILLARY: 110 mg/dL — AB (ref 65–99)
GLUCOSE-CAPILLARY: 139 mg/dL — AB (ref 65–99)
Glucose-Capillary: 57 mg/dL — ABNORMAL LOW (ref 65–99)
Glucose-Capillary: 122 mg/dL — ABNORMAL HIGH (ref 65–99)

## 2015-08-16 MED ORDER — DEXTROSE 5 % IV SOLN
2.0000 g | INTRAVENOUS | Status: DC
  Administered 2015-08-16 – 2015-08-18 (×3): 2 g via INTRAVENOUS
  Filled 2015-08-16 (×4): qty 2

## 2015-08-16 MED ORDER — DEXTROSE 50 % IV SOLN
INTRAVENOUS | Status: AC
  Administered 2015-08-16: 25 mL
  Filled 2015-08-16: qty 50

## 2015-08-16 MED ORDER — SODIUM CHLORIDE 0.45 % IV SOLN
INTRAVENOUS | Status: DC
  Administered 2015-08-16 – 2015-08-19 (×6): via INTRAVENOUS
  Filled 2015-08-16 (×11): qty 50

## 2015-08-16 NOTE — Progress Notes (Signed)
[facility] PROGRESS NOTE  [name] WIO:[id number] DOB: 06/26/1941 DOA: 08/13/2015 PCP: [name], MD  Assessment/Plan: 1. Aspiration pneumonia. Right lower lobe per xray in setting of chronic respiratory failure. Concern for chronic aspiration as she has a history of choking episodes and in view of her dementia. Speech therapy has conducted a swallow evaluation and recommends puree diet with honey thick liquids. Unfortunately, staff reports maybe aspirating with puree foods. I have discussed this with her daughter. I would not recommend a feeding tube at this point. Palliative Care consultation has been arranged for 08/17/15.  Remains afebrile and non-toxic appearing. Blood cultures show no growth to date. Sputum culture is unremarkable. Strep pneumonia is negative and legionella are negative. Oxygen saturation stable 2L. Will continue Fortaz and Vancomycin. Will continue inhaler. 2. Acute metabolic encephalopathy superimposed on baseline dementia.Appears to be back to baseline. Will continue current treatments.  3. Acute renal failure super-imposed on CKD Stage IV. Creatinine continues to slowly improve. Evaluated by nephrology who believes it is likely prerenal syndrome and adjusted IV fluids and started lasix BID.Foley catheter placed to monitor I&Os.   4. Hyperkalemia. Related to #2 and supplements. Resolved.  5. Diabetes. Blood sugars are stable. Will continue sliding scale of insulin. 6. Dementia. Appears to be stable and at baseline. Will continue with Aricept. 7. History of chronic systolic heart failure. ProBNP >4500. Chest xray with mild vascular congestion. Echocardiogram just a month ago shows ejection fraction 55-60% with evidence of diastolic dysfunction. Will continue IV lasix per nephrology 8. Anemia of chronic disease: stable. Seen by heme 08/13/15. On ESA therapy. 9. Seizure disorder. Will continue Keppra.  Code Status: DNR DVT prophylaxis:Lovenox Family  Communication: No family bedside. Discussed with daughter [name] over the phone. She is agreeable for palliative care consult to discuss goals of care. Disposition Plan: Discharge within 1-2 days.     Consultants:  Nephrology   Speech Therapy   Procedures:  None  Antibiotics:  fortaz 08/13/15>>  Vancomycin 08/13/15>>  HPI/Subjective: Breathing is okay today. No unproductive cough  Objective: Filed Vitals:   08/16/15 0645  BP: 151/71  Pulse: 65  Temp: 97.7 F (36.5 C)  Resp: 20    Intake/Output Summary (Last 24 hours) at 08/16/15 0801 Last data filed at 08/16/15 0645  Gross per 24 hour  Intake 2566.66 ml  Output   1500 ml  Net 1066.66 ml   Filed Weights   08/13/15 1245 08/13/15 1742  Weight: 63.05 kg (139 lb) 63.3 kg (139 lb 8.8 oz)    Exam:  General: NAD, looks comfortable  Cardiovascular: regular rate and rhythm, S1, S2   Respiratory: Bilateral coarse breath sounds.  Abdomen: soft, ntnd   Musculoskeletal: No bilateral LE edema    Data Reviewed: Basic Metabolic Panel:  Recent Labs Lab 08/13/15 1328 08/14/15 0613 08/15/15 0729 08/16/15 0603  NA 145 146* 140 139  K 5.8* 5.7* 5.4* 4.7  CL 123* 124* 117* 112*  CO2 15* 17* 16* 21*  GLUCOSE 134* 97 99 83  BUN 90* 104* 102* 95*  CREATININE 4.78* 4.61* 4.63* 4.15*  CALCIUM 8.7* 8.7* 8.1* 8.0*  MG  --  2.2  --   --   PHOS  --   --  4.9*  --    Liver Function Tests:  Recent Labs Lab 08/14/15 0613  AST 22  ALT 15  ALKPHOS 84  BILITOT 0.7  PROT 8.2*  ALBUMIN 3.3*   CBC:  Recent Labs Lab 08/13/15 1328 08/14/15 0613 08/15/15  1610 08/16/15 0603  WBC 4.8 4.1 4.1 3.4*  NEUTROABS 3.5  --   --   --   HGB 9.7* 10.0* 10.0* 9.8*  HCT 32.9* 33.2* 32.5* 31.3*  MCV 91.1 90.7 90.8 88.2  PLT 186 182 174 167   Cardiac Enzymes:  Recent Labs Lab 08/13/15 1328 08/13/15 1519  TROPONINI 0.06* 0.05*   BNP (last 3 results)  Recent Labs  05/28/15 1148 07/03/15 1514 08/13/15 1328  BNP  1084.0* 3737.0* >4500.0*    ProBNP (last 3 results)   CBG:  Recent Labs Lab 08/15/15 0734 08/15/15 1140 08/15/15 1617 08/15/15 2043 08/16/15 0718  GLUCAP 95 87 93 183* 82    Recent Results (from the past 240 hour(s))  Culture, blood (routine x 2)     Status: None (Preliminary result)   Collection Time: 08/13/15  1:50 PM  Result Value Ref Range Status   Specimen Description BLOOD RIGHT ANTECUBITAL  Final   Special Requests BOTTLES DRAWN AEROBIC AND ANAEROBIC 5CC EACH  Final   Culture NO GROWTH 2 DAYS  Final   Report Status PENDING  Incomplete  Culture, blood (routine x 2)     Status: None (Preliminary result)   Collection Time: 08/13/15  2:00 PM  Result Value Ref Range Status   Specimen Description BLOOD LEFT ANTECUBITAL  Final   Special Requests BOTTLES DRAWN AEROBIC AND ANAEROBIC 5CC EACH  Final   Culture NO GROWTH 2 DAYS  Final   Report Status PENDING  Incomplete  Culture, sputum-assessment     Status: None   Collection Time: 08/14/15  4:00 AM  Result Value Ref Range Status   Specimen Description TRACHEAL ASPIRATE  Final   Special Requests NONE  Final   Sputum evaluation   Final    THIS SPECIMEN IS ACCEPTABLE. RESPIRATORY CULTURE REPORT TO FOLLOW. Performed at [facility]    Report Status 08/14/2015 FINAL  Final  Culture, respiratory (NON-Expectorated)     Status: None (Preliminary result)   Collection Time: 08/14/15  4:00 AM  Result Value Ref Range Status   Specimen Description TRACHEAL ASPIRATE  Final   Special Requests NONE  Final   Gram Stain   Final    RARE WBC PRESENT, PREDOMINANTLY PMN ABUNDANT SQUAMOUS EPITHELIAL CELLS PRESENT FEW GRAM POSITIVE COCCI IN PAIRS IN CHAINS RARE GRAM NEGATIVE RODS Performed at [facility]    Culture   Final    NORMAL OROPHARYNGEAL FLORA Performed at [facility]    Report Status PENDING  Incomplete     Studies: No results found.  Scheduled Meds: . cefTAZidime (FORTAZ)  IV  2 g Intravenous  Q48H  . enoxaparin (LOVENOX) injection  30 mg Subcutaneous Q24H  . folic acid  1 mg Oral q morning - 10a  . furosemide  60 mg Intravenous BID  . insulin aspart  0-5 Units Subcutaneous QHS  . insulin aspart  0-9 Units Subcutaneous TID WC  . insulin detemir  4 Units Subcutaneous QHS  . levETIRAcetam  500 mg Intravenous Q12H  . linagliptin  5 mg Oral q morning - 10a  . metoprolol  5 mg Intravenous 4 times per day  . mupirocin ointment  1 application Topical BID  . pantoprazole (PROTONIX) IV  40 mg Intravenous Q24H  . RESOURCE THICKENUP CLEAR  1 Can Oral Daily  . sodium bicarbonate  650 mg Oral TID   Continuous Infusions: .  sodium bicarbonate  infusion 1000 mL 100 mL/hr at 08/16/15 9604    Principal  Problem:   HCAP (healthcare-associated pneumonia) Active Problems:   Hypertension   Diabetes mellitus   Seizure disorder   Iron deficiency anemia   Dementia   Severe mitral regurgitation   Cardiomyopathy, ischemic   Acute renal failure superimposed on stage 4 chronic kidney disease   Chronic combined systolic and diastolic CHF (congestive heart failure)   Anemia in chronic kidney disease (CKD)   Healthcare-associated pneumonia    Time spent: 35 minutes     Kathie Dike, MD  [facility] Pager [phone]. If 7PM-7AM, please contact night-coverage at [email or web address], password [facility] 08/16/2015, 8:01 AM  LOS: 3 days     By signing my name below, I, Rhett Bannister attest that this documentation has been prepared under the direction and in the presence of Kathie Dike, M.D.  Electronically signed: Rhett Bannister  08/16/2015    I, Dr. Kathie Dike, personally performed the services described in this documentaiton. All medical record entries made by the scribe were at my direction and in my presence. I have reviewed the chart and agree that the record reflects my personal performance and is accurate and complete  Kathie Dike, MD, 08/16/2015 8:01 AM

## 2015-08-16 NOTE — Progress Notes (Signed)
Subjective: Patient offer no complaint  Objective: Vital signs in last 24 hours: Temp:  [97.4 F (36.3 C)-97.9 F (36.6 C)] 97.7 F (36.5 C) (09/22 0645) Pulse Rate:  [59-75] 65 (09/22 0645) Resp:  [20] 20 (09/22 0645) BP: (144-175)/(68-79) 151/71 mmHg (09/22 0645) SpO2:  [96 %-100 %] 100 % (09/22 0645)  Intake/Output from previous day: 09/21 0701 - 09/22 0700 In: 2566.7 [P.O.:180; I.V.:2386.7] Out: 1500 [Urine:1500] Intake/Output this shift:     Recent Labs  08/13/15 1328 08/14/15 0613 08/15/15 0729 08/16/15 0603  HGB 9.7* 10.0* 10.0* 9.8*    Recent Labs  08/15/15 0729 08/16/15 0603  WBC 4.1 3.4*  RBC 3.58* 3.55*  HCT 32.5* 31.3*  PLT 174 167    Recent Labs  08/15/15 0729 08/16/15 0603  NA 140 139  K 5.4* 4.7  CL 117* 112*  CO2 16* 21*  BUN 102* 95*  CREATININE 4.63* 4.15*  GLUCOSE 99 83  CALCIUM 8.1* 8.0*   No results for input(s): LABPT, INR in the last 72 hours.  Generally patient is alert and in no apparent distress Chest she has some expiratory wheezing Heart exam revealed regular rate and rhythm Extremities no edema  Assessment/Plan: Problem #1 acute kidney injury superimposed on chronic. Etiology was thought to be secondary to prerenal syndrome versus ATN. Her BUN is high but creatinine is improving. Patient has 1500 cc of urine out put . Her creatinine is still above her base line Problem #2 chronic renal failure: Thought to be secondary to diabetes/hypertension/recurrent acute kidney injury/ischemic. She is stage IV chronic renal failure. Problem #3 pneumonia Problem #4 anemia: Her hemoglobin is stable. Problem #5 metabolic bone disease her calcium and her phosphorus is in range. Problem #6 metabolic acidosis: Patient is on sodium bicarbonate her CO2 is improving. Today it is 21 Problem #7 hyperkalemia: Potassium has corrected Plan: 1] Continue with present treatment 2 we'll check her basic metabolic panel in the  morning.   BEFEKADU,BELAYENH S 08/16/2015, 9:27 AM

## 2015-08-16 NOTE — Progress Notes (Signed)
ANTIBIOTIC CONSULT NOTE  Pharmacy Consult for vancomycin Indication: pneumonia  No Known Allergies  Patient Measurements: Height: 5\' 4"  (162.6 cm) Weight: 139 lb 8.8 oz (63.3 kg) IBW/kg (Calculated) : 54.7  Vital Signs: Temp: 97.7 F (36.5 C) (09/22 0645) Temp Source: Oral (09/22 0645) BP: 151/71 mmHg (09/22 0645) Pulse Rate: 65 (09/22 0645) Intake/Output from previous day: 09/21 0701 - 09/22 0700 In: 2566.7 [P.O.:180; I.V.:2386.7] Out: 1500 [Urine:1500] Intake/Output from this shift:   Labs:  Recent Labs  08/14/15 0613 08/15/15 0729 08/16/15 0603  WBC 4.1 4.1 3.4*  HGB 10.0* 10.0* 9.8*  PLT 182 174 167  CREATININE 4.61* 4.63* 4.15*   Estimated Creatinine Clearance: 10.3 mL/min (by C-G formula based on Cr of 4.15).  Recent Labs  08/15/15 0729  [facility] 14    Microbiology: Recent Results (from the past 720 hour(s))  Culture, blood (routine x 2)     Status: None (Preliminary result)   Collection Time: 08/13/15  1:50 PM  Result Value Ref Range Status   Specimen Description BLOOD RIGHT ANTECUBITAL  Final   Special Requests BOTTLES DRAWN AEROBIC AND ANAEROBIC 5CC EACH  Final   Culture NO GROWTH 2 DAYS  Final   Report Status PENDING  Incomplete  Culture, blood (routine x 2)     Status: None (Preliminary result)   Collection Time: 08/13/15  2:00 PM  Result Value Ref Range Status   Specimen Description BLOOD LEFT ANTECUBITAL  Final   Special Requests BOTTLES DRAWN AEROBIC AND ANAEROBIC 5CC EACH  Final   Culture NO GROWTH 2 DAYS  Final   Report Status PENDING  Incomplete  Culture, sputum-assessment     Status: None   Collection Time: 08/14/15  4:00 AM  Result Value Ref Range Status   Specimen Description TRACHEAL ASPIRATE  Final   Special Requests NONE  Final   Sputum evaluation   Final    THIS SPECIMEN IS ACCEPTABLE. RESPIRATORY CULTURE REPORT TO FOLLOW. Performed at [facility]    Report Status 08/14/2015 FINAL  Final  Culture, respiratory  (NON-Expectorated)     Status: None (Preliminary result)   Collection Time: 08/14/15  4:00 AM  Result Value Ref Range Status   Specimen Description TRACHEAL ASPIRATE  Final   Special Requests NONE  Final   Gram Stain   Final    RARE WBC PRESENT, PREDOMINANTLY PMN ABUNDANT SQUAMOUS EPITHELIAL CELLS PRESENT FEW GRAM POSITIVE COCCI IN PAIRS IN CHAINS RARE GRAM NEGATIVE RODS Performed at [facility]    Culture   Final    NORMAL OROPHARYNGEAL FLORA Performed at [facility]    Report Status PENDING  Incomplete    Medical History: Past Medical History  Diagnosis Date  . Type 2 diabetes mellitus   . Essential hypertension, benign   . History of stroke     Previously on Coumadin  . MI, old     Reported 58  . Seizure disorder   . Coronary atherosclerosis of native coronary artery     Multivessel status post CABG 2002  . History of GI bleed     Erosive gastritis and duodenitis by EGD 2002  . Mixed hyperlipidemia   . Ischemic cardiomyopathy     LVEF 40-45% March 2013  . Dementia   . CKD (chronic kidney disease) stage 4, GFR 15-29 ml/min   . CHF (congestive heart failure)   . Chronic respiratory failure with hypoxia   . Stroke   . Seizures   . Anemia of chronic  renal failure, stage 4 (severe) 01/02/2015   Medications:  Prescriptions prior to admission  Medication Sig Dispense Refill Last Dose  . albuterol (PROVENTIL HFA;VENTOLIN HFA) 108 (90 BASE) MCG/ACT inhaler Inhale 2 puffs into the lungs every 6 (six) hours as needed for wheezing or shortness of breath. 1 Inhaler 2 Past Week at Unknown time  . calcitRIOL (ROCALTROL) 0.25 MCG capsule Take 1 capsule (0.25 mcg total) by mouth daily. 31 capsule 0 08/13/2015 at Unknown time  . donepezil (ARICEPT) 10 MG tablet Take 10 mg by mouth at bedtime.    08/12/2015 at Unknown time  . folic acid (FOLVITE) 1 MG tablet Take 1 mg by mouth every morning.    08/13/2015 at Unknown time  . hydrALAZINE (APRESOLINE) 25 MG tablet Take  75 mg by mouth every 8 (eight) hours.    08/13/2015 at Unknown time  . insulin detemir (LEVEMIR) 100 UNIT/ML injection Inject 0.04 mLs (4 Units total) into the skin at bedtime.   08/12/2015 at Unknown time  . isosorbide mononitrate (IMDUR) 30 MG 24 hr tablet Take 0.5 tablets (15 mg total) by mouth every morning. 30 tablet 0 08/13/2015 at Unknown time  . levETIRAcetam (KEPPRA) 100 MG/ML solution Take 500 mg by mouth 2 (two) times daily.    08/13/2015 at Unknown time  . linagliptin (TRADJENTA) 5 MG TABS tablet Take 5 mg by mouth every morning.    08/13/2015 at Unknown time  . Maltodextrin-Xanthan Gum (RESOURCE THICKENUP CLEAR) POWD Take 120 g by mouth as needed. (Patient taking differently: Take 1 Can by mouth daily. )   08/13/2015 at Unknown time  . metoprolol succinate (TOPROL-XL) 100 MG 24 hr tablet Take 50 mg by mouth daily. Take with or immediately following a meal.   08/13/2015 at 0900  . mupirocin ointment (BACTROBAN) 2 % Apply 1 application topically 2 (two) times daily.    08/13/2015 at Unknown time  . pantoprazole (PROTONIX) 40 MG tablet Take 40 mg by mouth every morning.   08/13/2015 at Unknown time  . potassium chloride (K-DUR) 10 MEQ tablet Take 2 tablets by mouth daily.   08/13/2015 at Unknown time  . pravastatin (PRAVACHOL) 20 MG tablet Take 20 mg by mouth at bedtime.    08/12/2015 at Unknown time  . torsemide (DEMADEX) 20 MG tablet Take 20 mg by mouth daily.   08/13/2015 at Unknown time   Assessment: 74 yo lady to start broad spectrum antibiotics for PNA.  She has a h/o CKD.  Slightly improved renal fucntion w/ Estimated Creatinine Clearance: 10.3 mL/min (by C-G formula based on Cr of 4.15).    Goal of Therapy:  Vancomycin trough level 15-20 mcg/ml  Plan:  Will check random level in AM hours for redose. Increase Fortaz dose to 2 gm IV q24 hours. F/u renal function, cultures and clinical course  [name], [facility]   08/16/2015,12:19 PM

## 2015-08-16 NOTE — Progress Notes (Signed)
Present with patient and her two daughters. Patient was sleeping and her daughters discussed the heath changes their mother was going through. Listened supportively. Prayed with them. Will follow up.

## 2015-08-17 DIAGNOSIS — Z515 Encounter for palliative care: Secondary | ICD-10-CM | POA: Insufficient documentation

## 2015-08-17 DIAGNOSIS — Z7189 Other specified counseling: Secondary | ICD-10-CM | POA: Diagnosis not present

## 2015-08-17 LAB — BASIC METABOLIC PANEL
Anion gap: 9 (ref 5–15)
BUN: 92 mg/dL — AB (ref 6–20)
CALCIUM: 7.7 mg/dL — AB (ref 8.9–10.3)
CHLORIDE: 108 mmol/L (ref 101–111)
CO2: 22 mmol/L (ref 22–32)
CREATININE: 3.77 mg/dL — AB (ref 0.44–1.00)
GFR calc non Af Amer: 11 mL/min — ABNORMAL LOW (ref >60)
GFR, EST AFRICAN AMERICAN: 13 mL/min — AB (ref >60)
GLUCOSE: 62 mg/dL — AB (ref 65–99)
Potassium: 4.3 mmol/L (ref 3.5–5.1)
Sodium: 139 mmol/L (ref 135–145)

## 2015-08-17 LAB — GLUCOSE, CAPILLARY
GLUCOSE-CAPILLARY: 66 mg/dL (ref 65–99)
GLUCOSE-CAPILLARY: 75 mg/dL (ref 65–99)
Glucose-Capillary: 56 mg/dL — ABNORMAL LOW (ref 65–99)
Glucose-Capillary: 110 mg/dL — ABNORMAL HIGH (ref 65–99)
Glucose-Capillary: 148 mg/dL — ABNORMAL HIGH (ref 65–99)

## 2015-08-17 LAB — CULTURE, RESPIRATORY W GRAM STAIN: Culture: NORMAL

## 2015-08-17 LAB — VANCOMYCIN, RANDOM: Vancomycin Rm: 22 ug/mL

## 2015-08-17 LAB — CULTURE, RESPIRATORY

## 2015-08-17 MED ORDER — DEXTROSE 50 % IV SOLN
INTRAVENOUS | Status: AC
  Administered 2015-08-17: 25 mL
  Filled 2015-08-17: qty 50

## 2015-08-17 MED ORDER — VANCOMYCIN HCL IN DEXTROSE 1-5 GM/200ML-% IV SOLN
1000.0000 mg | INTRAVENOUS | Status: DC
  Administered 2015-08-17: 1000 mg via INTRAVENOUS
  Filled 2015-08-17 (×2): qty 200

## 2015-08-17 NOTE — Progress Notes (Signed)
[facility] PROGRESS NOTE  [name] TMH:[id number] DOB: 07-08-1941 DOA: 08/13/2015 PCP: [name], MD  Assessment/Plan: 1. Aspiration pneumonia in the right lower lob per xray in setting of chronic respiratory failure. Concern for chronic aspiration as she has a history of choking episodes and in view of her dementia. Speech therapy consulted 9/21 and recommended puree diet with honey thick liquids however staff resports patient may be aspirating puree food. Discussed plan with daughter. Given I do not recommend feeding tube, Palliative Care consultation ordered for today to discuss goals of care. Remains afebrile and non-toxic appearing. Blood cultures show no growth to date. Sputum culture is unremarkable . Strep pneumonia is negative and legionella are negative. Oxygen saturation stable on 2L. Will continue Ceftazidime and inhaler. 2. Acute metabolic encephalopathy superimposed on baseline dementia, resolved. Patient appears to be back to baseline. Will continue current treatments.  3. Acute renal failure superimposed on CKD Stage IV. Creatinine continues to slowly improve. Evaluated by nephrology who believes it is likely prerenal syndrome. They agree with current plan.Foley catheter placed to monitor I&Os.   4. Hyperkalemia. Related to #2 and supplements. Resolved.  5. Diabetes. Blood sugar 62. Will discontinue Levemir. Continue to monitor and adjust sliding scale of insulin. 6. Dementia. Appears to be stable and at baseline. Will continue with Aricept. 7. History of chronic systolic heart failure. ProBNP >4500. Chest xray with mild vascular congestion. Echocardiogram 1 month ago shows ejection fraction 55-60% with evidence of diastolic dysfunction. Will continue IV lasix per nephrology 8. Anemia of chronic disease: stable. Seen by heme 08/13/15. On ESA therapy. 9. Seizure disorder. Will continue Keppra.  Code Status: DNR DVT prophylaxis:Lovenox Family Communication: No  family at bedside. Discussed with patient who understands and has no concerns at this time. Disposition Plan: Discharge upon improvement.   Consultants:  Nephrology   Speech Therapy   Procedures:  None  Antibiotics:  Ceftazidime 9/19>>  Vancomycin 9/19/>>9/21  HPI/Subjective: Feels better. States breathing is improved and she is not coughing. Denies pain, nausea, or abdominal pain.   Objective: Filed Vitals:   08/17/15 0444  BP:   Pulse: 69  Temp: 97.8 F (36.6 C)  Resp: 20    Intake/Output Summary (Last 24 hours) at 08/17/15 0809 Last data filed at 08/17/15 0730  Gross per 24 hour  Intake 1781.67 ml  Output   2300 ml  Net -518.33 ml   Filed Weights   08/13/15 1245 08/13/15 1742  Weight: 63.05 kg (139 lb) 63.3 kg (139 lb 8.8 oz)    Exam: General: NAD, looks comfortable Cardiovascular: RRR, S1, S2  Respiratory: coarse breath sounds bilaterally, No wheezing, rales or rhnochi Abdomen: soft, non tender, no distention , bowel sounds normal Musculoskeletal: 1+ BLE edema  Data Reviewed: Basic Metabolic Panel:  Recent Labs Lab 08/13/15 1328 08/14/15 0613 08/15/15 0729 08/16/15 0603 08/17/15 0606  NA 145 146* 140 139 139  K 5.8* 5.7* 5.4* 4.7 4.3  CL 123* 124* 117* 112* 108  CO2 15* 17* 16* 21* 22  GLUCOSE 134* 97 99 83 62*  BUN 90* 104* 102* 95* 92*  CREATININE 4.78* 4.61* 4.63* 4.15* 3.77*  CALCIUM 8.7* 8.7* 8.1* 8.0* 7.7*  MG  --  2.2  --   --   --   PHOS  --   --  4.9*  --   --    Liver Function Tests:  Recent Labs Lab 08/14/15 0613  AST 22  ALT 15  ALKPHOS 84  BILITOT 0.7  PROT 8.2*  ALBUMIN 3.3*   CBC:  Recent Labs Lab 08/13/15 1328 08/14/15 0613 08/15/15 0729 08/16/15 0603  WBC 4.8 4.1 4.1 3.4*  NEUTROABS 3.5  --   --   --   HGB 9.7* 10.0* 10.0* 9.8*  HCT 32.9* 33.2* 32.5* 31.3*  MCV 91.1 90.7 90.8 88.2  PLT 186 182 174 167   Cardiac Enzymes:  Recent Labs Lab 08/13/15 1328 08/13/15 1519  TROPONINI 0.06* 0.05*    BNP (last 3 results)  Recent Labs  05/28/15 1148 07/03/15 1514 08/13/15 1328  BNP 1084.0* 3737.0* >4500.0*    ProBNP (last 3 results)   CBG:  Recent Labs Lab 08/16/15 1126 08/16/15 1208 08/16/15 1615 08/16/15 2115 08/17/15 0758  GLUCAP 57* 110* 139* 122* 56*    Recent Results (from the past 240 hour(s))  Culture, blood (routine x 2)     Status: None (Preliminary result)   Collection Time: 08/13/15  1:50 PM  Result Value Ref Range Status   Specimen Description BLOOD RIGHT ANTECUBITAL  Final   Special Requests BOTTLES DRAWN AEROBIC AND ANAEROBIC 5CC EACH  Final   Culture NO GROWTH 4 DAYS  Final   Report Status PENDING  Incomplete  Culture, blood (routine x 2)     Status: None (Preliminary result)   Collection Time: 08/13/15  2:00 PM  Result Value Ref Range Status   Specimen Description BLOOD LEFT ANTECUBITAL  Final   Special Requests BOTTLES DRAWN AEROBIC AND ANAEROBIC 5CC EACH  Final   Culture NO GROWTH 4 DAYS  Final   Report Status PENDING  Incomplete  Culture, sputum-assessment     Status: None   Collection Time: 08/14/15  4:00 AM  Result Value Ref Range Status   Specimen Description TRACHEAL ASPIRATE  Final   Special Requests NONE  Final   Sputum evaluation   Final    THIS SPECIMEN IS ACCEPTABLE. RESPIRATORY CULTURE REPORT TO FOLLOW. Performed at [facility]    Report Status 08/14/2015 FINAL  Final  Culture, respiratory (NON-Expectorated)     Status: None (Preliminary result)   Collection Time: 08/14/15  4:00 AM  Result Value Ref Range Status   Specimen Description TRACHEAL ASPIRATE  Final   Special Requests NONE  Final   Gram Stain   Final    RARE WBC PRESENT, PREDOMINANTLY PMN ABUNDANT SQUAMOUS EPITHELIAL CELLS PRESENT FEW GRAM POSITIVE COCCI IN PAIRS IN CHAINS RARE GRAM NEGATIVE RODS Performed at [facility]    Culture   Final    NORMAL OROPHARYNGEAL FLORA Performed at [facility]    Report Status PENDING  Incomplete      Studies: No results found.  Scheduled Meds: . cefTAZidime (FORTAZ)  IV  2 g Intravenous Q24H  . enoxaparin (LOVENOX) injection  30 mg Subcutaneous Q24H  . folic acid  1 mg Oral q morning - 10a  . furosemide  60 mg Intravenous BID  . insulin aspart  0-5 Units Subcutaneous QHS  . insulin aspart  0-9 Units Subcutaneous TID WC  . insulin detemir  4 Units Subcutaneous QHS  . levETIRAcetam  500 mg Intravenous Q12H  . linagliptin  5 mg Oral q morning - 10a  . metoprolol  5 mg Intravenous 4 times per day  . mupirocin ointment  1 application Topical BID  . pantoprazole (PROTONIX) IV  40 mg Intravenous Q24H  . RESOURCE THICKENUP CLEAR  1 Can Oral Daily  . sodium bicarbonate  650 mg Oral TID   Continuous Infusions: .  sodium bicarbonate  infusion 1000 mL 100 mL/hr at 08/16/15 1641    Principal Problem:   HCAP (healthcare-associated pneumonia) Active Problems:   Hypertension   Diabetes mellitus   Seizure disorder   Iron deficiency anemia   Dementia   Severe mitral regurgitation   Cardiomyopathy, ischemic   Acute renal failure superimposed on stage 4 chronic kidney disease   Chronic combined systolic and diastolic CHF (congestive heart failure)   Anemia in chronic kidney disease (CKD)   Healthcare-associated pneumonia    Time spent: 25 minutes     Kathie Dike, MD  [facility] Pager [phone]. If 7PM-7AM, please contact night-coverage at [email or web address], password [facility] 08/17/2015, 8:09 AM  LOS: 4 days     By signing my name below, I, Rosalie Doctor, attest that this documentation has been prepared under the direction and in the presence of [facility]. MD Electronically Signed: Rosalie Doctor, Scribe. 08/17/2015   I, Dr. Kathie Dike, personally performed the services described in this documentaiton. All medical record entries made by the scribe were at my direction and in my presence. I have reviewed the chart and agree that the record reflects my  personal performance and is accurate and complete  Kathie Dike, MD, 08/17/2015 10:41 AM

## 2015-08-17 NOTE — Progress Notes (Signed)
ANTIBIOTIC CONSULT NOTE  Pharmacy Consult for vancomycin Indication: pneumonia  No Known Allergies  Patient Measurements: Height: 5\' 4"  (162.6 cm) Weight: 139 lb 8.8 oz (63.3 kg) IBW/kg (Calculated) : 54.7  Vital Signs: Temp: 97.8 F (36.6 C) (09/23 0444) Temp Source: Oral (09/23 0444) Pulse Rate: 69 (09/23 0444) Intake/Output from previous day: 09/22 0701 - 09/23 0700 In: 1781.7 [P.O.:350; I.V.:1431.7] Out: 1600 [Urine:1600] Intake/Output from this shift: Total I/O In: 120 [P.O.:120] Out: 700 [Urine:700] Labs:  Recent Labs  08/15/15 0729 08/16/15 0603 08/17/15 0606  WBC 4.1 3.4*  --   HGB 10.0* 9.8*  --   PLT 174 167  --   CREATININE 4.63* 4.15* 3.77*   Estimated Creatinine Clearance: 11.3 mL/min (by C-G formula based on Cr of 3.77).  Recent Labs  08/15/15 0729 08/17/15 0606  VANCORANDOM 14 56    Microbiology: Recent Results (from the past 720 hour(s))  Culture, blood (routine x 2)     Status: None (Preliminary result)   Collection Time: 08/13/15  1:50 PM  Result Value Ref Range Status   Specimen Description BLOOD RIGHT ANTECUBITAL  Final   Special Requests BOTTLES DRAWN AEROBIC AND ANAEROBIC 5CC EACH  Final   Culture NO GROWTH 4 DAYS  Final   Report Status PENDING  Incomplete  Culture, blood (routine x 2)     Status: None (Preliminary result)   Collection Time: 08/13/15  2:00 PM  Result Value Ref Range Status   Specimen Description BLOOD LEFT ANTECUBITAL  Final   Special Requests BOTTLES DRAWN AEROBIC AND ANAEROBIC 5CC EACH  Final   Culture NO GROWTH 4 DAYS  Final   Report Status PENDING  Incomplete  Culture, sputum-assessment     Status: None   Collection Time: 08/14/15  4:00 AM  Result Value Ref Range Status   Specimen Description TRACHEAL ASPIRATE  Final   Special Requests NONE  Final   Sputum evaluation   Final    THIS SPECIMEN IS ACCEPTABLE. RESPIRATORY CULTURE REPORT TO FOLLOW. Performed at [facility]    Report Status 08/14/2015  FINAL  Final  Culture, respiratory (NON-Expectorated)     Status: None   Collection Time: 08/14/15  4:00 AM  Result Value Ref Range Status   Specimen Description TRACHEAL ASPIRATE  Final   Special Requests NONE  Final   Gram Stain   Final    RARE WBC PRESENT, PREDOMINANTLY PMN ABUNDANT SQUAMOUS EPITHELIAL CELLS PRESENT FEW GRAM POSITIVE COCCI IN PAIRS IN CHAINS RARE GRAM NEGATIVE RODS Performed at [facility]    Culture   Final    NORMAL OROPHARYNGEAL FLORA Performed at [facility]    Report Status 08/17/2015 FINAL  Final    Medical History: Past Medical History  Diagnosis Date  . Type 2 diabetes mellitus   . Essential hypertension, benign   . History of stroke     Previously on Coumadin  . MI, old     Reported 34  . Seizure disorder   . Coronary atherosclerosis of native coronary artery     Multivessel status post CABG 2002  . History of GI bleed     Erosive gastritis and duodenitis by EGD 2002  . Mixed hyperlipidemia   . Ischemic cardiomyopathy     LVEF 40-45% March 2013  . Dementia   . CKD (chronic kidney disease) stage 4, GFR 15-29 ml/min   . CHF (congestive heart failure)   . Chronic respiratory failure with hypoxia   . Stroke   .  Seizures   . Anemia of chronic renal failure, stage 4 (severe) 01/02/2015   Medications:  Prescriptions prior to admission  Medication Sig Dispense Refill Last Dose  . albuterol (PROVENTIL HFA;VENTOLIN HFA) 108 (90 BASE) MCG/ACT inhaler Inhale 2 puffs into the lungs every 6 (six) hours as needed for wheezing or shortness of breath. 1 Inhaler 2 Past Week at Unknown time  . calcitRIOL (ROCALTROL) 0.25 MCG capsule Take 1 capsule (0.25 mcg total) by mouth daily. 31 capsule 0 08/13/2015 at Unknown time  . donepezil (ARICEPT) 10 MG tablet Take 10 mg by mouth at bedtime.    08/12/2015 at Unknown time  . folic acid (FOLVITE) 1 MG tablet Take 1 mg by mouth every morning.    08/13/2015 at Unknown time  . hydrALAZINE (APRESOLINE)  25 MG tablet Take 75 mg by mouth every 8 (eight) hours.    08/13/2015 at Unknown time  . insulin detemir (LEVEMIR) 100 UNIT/ML injection Inject 0.04 mLs (4 Units total) into the skin at bedtime.   08/12/2015 at Unknown time  . isosorbide mononitrate (IMDUR) 30 MG 24 hr tablet Take 0.5 tablets (15 mg total) by mouth every morning. 30 tablet 0 08/13/2015 at Unknown time  . levETIRAcetam (KEPPRA) 100 MG/ML solution Take 500 mg by mouth 2 (two) times daily.    08/13/2015 at Unknown time  . linagliptin (TRADJENTA) 5 MG TABS tablet Take 5 mg by mouth every morning.    08/13/2015 at Unknown time  . Maltodextrin-Xanthan Gum (RESOURCE THICKENUP CLEAR) POWD Take 120 g by mouth as needed. (Patient taking differently: Take 1 Can by mouth daily. )   08/13/2015 at Unknown time  . metoprolol succinate (TOPROL-XL) 100 MG 24 hr tablet Take 50 mg by mouth daily. Take with or immediately following a meal.   08/13/2015 at 0900  . mupirocin ointment (BACTROBAN) 2 % Apply 1 application topically 2 (two) times daily.    08/13/2015 at Unknown time  . pantoprazole (PROTONIX) 40 MG tablet Take 40 mg by mouth every morning.   08/13/2015 at Unknown time  . potassium chloride (K-DUR) 10 MEQ tablet Take 2 tablets by mouth daily.   08/13/2015 at Unknown time  . pravastatin (PRAVACHOL) 20 MG tablet Take 20 mg by mouth at bedtime.    08/12/2015 at Unknown time  . torsemide (DEMADEX) 20 MG tablet Take 20 mg by mouth daily.   08/13/2015 at Unknown time   Assessment: 74 yo lady to start broad spectrum antibiotics for PNA.  She has a h/o CKD.  SCr slightly improved  Estimated Creatinine Clearance: 11.3 mL/min (by C-G formula based on Cr of 3.77).  Random Vancomycin level noted, has not had Vancomycin since 9/21.   Afebrile.  Goal of Therapy:  Vancomycin trough level 15-20 mcg/ml  Plan:  Vancomycin 1000mg  IV q48hrs (next dose later today) Continue Fortaz 2 gm IV q24 hours. F/u renal function, cultures and clinical course  Ena Dawley, RPH    08/17/2015,10:48 AM

## 2015-08-17 NOTE — Care Management Important Message (Signed)
Important Message  Patient Details  Name: [name] MRN: [id number] Date of Birth: August 23, 1941   Medicare Important Message Given:  Yes-second notification given    Sherald Barge, RN 08/17/2015, 9:41 AM

## 2015-08-17 NOTE — Progress Notes (Signed)
Subjective: Patient offer no complaint. She denies any nausea or vomiting.  Objective: Vital signs in last 24 hours: Temp:  [97.6 F (36.4 C)-98.7 F (37.1 C)] 97.8 F (36.6 C) (09/23 0444) Pulse Rate:  [69-73] 69 (09/23 0444) Resp:  [20] 20 (09/23 0444) BP: (152-167)/(62-70) 152/70 mmHg (09/22 2129) SpO2:  [100 %] 100 % (09/23 0444)  Intake/Output from previous day: 09/22 0701 - 09/23 0700 In: 1781.7 [P.O.:350; I.V.:1431.7] Out: 1600 [Urine:1600] Intake/Output this shift: Total I/O In: 120 [P.O.:120] Out: 700 [Urine:700]   Recent Labs  08/15/15 0729 08/16/15 0603  HGB 10.0* 9.8*    Recent Labs  08/15/15 0729 08/16/15 0603  WBC 4.1 3.4*  RBC 3.58* 3.55*  HCT 32.5* 31.3*  PLT 174 167    Recent Labs  08/16/15 0603 08/17/15 0606  NA 139 139  K 4.7 4.3  CL 112* 108  CO2 21* 22  BUN 95* 92*  CREATININE 4.15* 3.77*  GLUCOSE 83 62*  CALCIUM 8.0* 7.7*   No results for input(s): LABPT, INR in the last 72 hours.  Generally patient is alert and in no apparent distress Chest she has some Coarse inspiratory rhonchi. Heart exam revealed regular rate and rhythm Extremities no edema  Assessment/Plan: Problem #1 acute kidney injury superimposed on chronic. Etiology was thought to be secondary to prerenal syndrome versus ATN. Patient with 1600 mL of urine output. Presently her BUN and creatinine is progressively improving. Problem #2 chronic renal failure: Thought to be secondary to diabetes/hypertension/recurrent acute kidney injury/ischemic. She is stage IV chronic renal failure. Problem #3 pneumonia Problem #4 anemia: A combination of iron deficiency and anemia of chronic disease. Her hemoglobin is slightly lower than our target goal. Problem #5 metabolic bone disease her calcium and her phosphorus is in range. Problem #6 metabolic acidosis: Patient is on sodium bicarbonate her CO2 is improving. Today it is 22 Problem #7 hyperkalemia: Potassium has  corrected Plan: 1] Continue with present treatment 2 we'll check her basic metabolic panel in the morning.   BEFEKADU,BELAYENH S 08/17/2015, 9:25 AM

## 2015-08-17 NOTE — Care Management Note (Signed)
Case Management Note  Patient Details  Name: [name] MRN: [id number] Date of Birth: [birth year]  Expected Discharge Date:       08/20/2015           Expected Discharge Plan:  [place]  In-House Referral:  NA  Discharge planning Services  CM Consult  Post Acute Care Choice:  Resumption of Svcs/PTA Provider Choice offered to:  Adult Children, HC POA / Guardian  DME Arranged:    DME Agency:     HH Arranged:  RN [place] Agency:  [place]  Status of Service:  In process, will continue to follow  Medicare Important Message Given:  Yes-second notification given Date Medicare IM Given:    Medicare IM give by:    Date Additional Medicare IM Given:    Additional Medicare Important Message give by:     If discussed at [place] of Stay Meetings, dates discussed:    Additional Comments: Palliative has met with family. Plan to cont tx at this time. DC over weekend not anticipated. Will cont to follow.  Sherald Barge, RN 08/17/2015, 2:41 PM

## 2015-08-17 NOTE — Consult Note (Signed)
Consultation Note Date: 08/17/2015   Patient Name: [name]  DOB: November 06, 1941  MRN: [id number]  Age / Sex: 74 y.o., female   PCP: Iona Beard, MD Referring Physician: Kathie Dike, MD  Reason for Consultation: Establishing goals of care and Psychosocial/spiritual support  Palliative Care Assessment and Plan Summary of Established Goals of Care and Medical Treatment Preferences   Clinical Assessment/Narrative:  [name] is resting quietly in bed,  She is unable to tell me where we are at this time.  She denies pain, hunger or thirst.  Call to daughter [name] and meeting scheduled for 1130 at bedside.  Daughters [name] and [name] are at bedside today for our visit.  We discuss [name]'s chronic health concerns including CKD and dementia. I review [name]'s creatinine trend and we talk about dialysis.  [name] tells me that they have been talking with nephrologist (seen on 8/29) about HD, and while nephrology is pushing for HD, [name] tells me that she knows her mother is weak.   We talk about the dementia trajectory, including falls/ decreased mobility leading to becoming bed bound.  I share with them the risks of immobility including PNE, skin breakdown and infections.    We also talk about diet/appetitie and dysphagia.  We talk about the ST swallow eval completed and recommend dysphagia 1 (puree) with Honey thick liquids.   [name] tells me that [name] is on this type of food (dysphagia 1 with thick liquids) at home and that she is a 'good eater'.   We discuss the s/s of aspiration including cough, tearing, and at times, no indication that someone is aspirating.  Lunch is delivered during our visit and Kimberly feeds her mother.  During feeding [name] often has a wet cough/wheeze/ or gurgle.  I remark on this with her family.   I share my worry about [name]'s breathing, and the likelihood that she will continue to aspirate. 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 [name] remains quiet throughout our visit.  I ask what worries them the most and [name] tells me "her breathing".     We talk about options such as feeding tube and associated risks, such as continued aspiration, no longer being able to eat, and body image disturbance and although no decision is made, [name] tells me that her mother would not want a feeding tube.  [name] states she wants her mother to be comfortable.  This family will continue to need gentle support to understand their mothers chronic disease process and determine what is right for their family.    Contacts/Participants in Discussion: Primary Decision Maker: [name] is unable to make her own decisions dt dementia.  HCPOA: no ; Kimberly tells me that she has paperwork that has been signed that she needs to have notarized.  I advise that if this is a recent document, that [name] lacks capacity to complete such a document and there would be shared responsibility.   There are only two children, [name] and [name]. Six sisters and 4 brothers.   Code Status/Advance Care Planning:  DNR on this admission  MOST form reviewed.  Family is not ready to make any decisions at this time.   Concepts of do not re hospitalize and do not treat next infection were introduced. Reassured that no one is telling family to not bring her back to the hospital.  [name] does tell me that her mother would want to die with her family around her.  I verify if this means in SNF, hospital or home. [name] tells me preference would be at home.   Concept of Hospice NOT discussed today.   Symptom Management:   No complaints of pain.   Palliative Prophylaxis: none, recommend Senna-S 1 tab PO QD.   Psycho-social/Spiritual:   Support System:  Lives with daughter [name] who is also her [employer].   Desire for further Chaplaincy support:  Ongoing   Prognosis: Unable to determine,  but likely 6 months or less d/t dementia and aspiration risk.   Discharge Planning:  Home with Home Health       Chief Complaint:  Dyspnea and weakness History of Present Illness:  [name] is a 74 y.o. female who has dementia and cannot give a clear history. Apparently, she was visiting hematology clinic 9/19 to get an injection for her chronic anemia. She was found to be short of breath.  Family states that she had been short of breath for the last couple of days, worsening today. She was sent to the emergency room and found to be saturating 100% on 2 L oxygen. She does have underlying COPD and congestive heart failure. Family endorses coughing and fever but does not seem to confirm dyspnea. Nonetheless, emergency room evaluation finds her to have right lower lobe pneumonia. She was admitted for further management with IV antibiotics.  She has had a swallow study and dysphagia 1 (puree) with honey thick liquid is recommended.    Primary Diagnoses  Present on Admission:  . HCAP (healthcare-associated pneumonia) . Acute renal failure superimposed on stage 4 chronic kidney disease . Anemia in chronic kidney disease (CKD) . Cardiomyopathy, ischemic . Chronic combined systolic and diastolic CHF (congestive heart failure) . Dementia . Hypertension . Healthcare-associated pneumonia . Severe mitral regurgitation . Iron deficiency anemia  Palliative Review of Systems: [name] denies pain, anxiety, NV or dyspnea at this time.  I have reviewed the medical record, interviewed the patient and family, and examined the patient. The following aspects are pertinent.  Past Medical History  Diagnosis Date  . Type 2 diabetes mellitus   . Essential hypertension, benign   . History of stroke     Previously on Coumadin  . MI, old     Reported 78  . Seizure disorder   . Coronary atherosclerosis of native coronary artery     Multivessel status post CABG 2002  . History of GI bleed      Erosive gastritis and duodenitis by EGD 2002  . Mixed hyperlipidemia   . Ischemic cardiomyopathy     LVEF 40-45% March 2013  . Dementia   . CKD (chronic kidney disease) stage 4, GFR 15-29 ml/min   . CHF (congestive heart failure)   . Chronic respiratory failure with hypoxia   . Stroke   . Seizures   . Anemia of chronic renal failure, stage 4 (severe) 01/02/2015   Social History   Social History  . Marital Status: Single    Spouse Name: [name]  . Number of Children: [name]  . Years of Education: [name]   Social History Main Topics  . Smoking status: Former Smoker -- 1.00 packs/day for 50 years    Types: Cigarettes    Start date: 02/22/1961    Quit date: 02/23/2012  . Smokeless tobacco: Never Used  . Alcohol Use: No  . Drug Use: No  . Sexual  Activity: No   Other Topics Concern  . None   Social History Narrative   Family History  Problem Relation Age of Onset  . Stroke Father   . Diabetes type II Mother   . Colon cancer Neg Hx    Scheduled Meds: . cefTAZidime (FORTAZ)  IV  2 g Intravenous Q24H  . enoxaparin (LOVENOX) injection  30 mg Subcutaneous Q24H  . folic acid  1 mg Oral q morning - 10a  . furosemide  60 mg Intravenous BID  . insulin aspart  0-5 Units Subcutaneous QHS  . insulin aspart  0-9 Units Subcutaneous TID WC  . levETIRAcetam  500 mg Intravenous Q12H  . linagliptin  5 mg Oral q morning - 10a  . metoprolol  5 mg Intravenous 4 times per day  . mupirocin ointment  1 application Topical BID  . pantoprazole (PROTONIX) IV  40 mg Intravenous Q24H  . RESOURCE THICKENUP CLEAR  1 Can Oral Daily  . sodium bicarbonate  650 mg Oral TID   Continuous Infusions: .  sodium bicarbonate  infusion 1000 mL 100 mL/hr at 08/16/15 1641   PRN Meds:.albuterol Medications Prior to Admission:  Prior to Admission medications   Medication Sig Start Date End Date Taking? Authorizing Provider  albuterol (PROVENTIL HFA;VENTOLIN HFA) 108 (90 BASE) MCG/ACT inhaler Inhale 2 puffs into the  lungs every 6 (six) hours as needed for wheezing or shortness of breath. 08/02/15  Yes Arnoldo Lenis, MD  calcitRIOL (ROCALTROL) 0.25 MCG capsule Take 1 capsule (0.25 mcg total) by mouth daily. 05/02/14  Yes Eugenie Filler, MD  donepezil (ARICEPT) 10 MG tablet Take 10 mg by mouth at bedtime.  01/30/14  Yes Historical Provider, MD  folic acid (FOLVITE) 1 MG tablet Take 1 mg by mouth every morning.    Yes Historical Provider, MD  hydrALAZINE (APRESOLINE) 25 MG tablet Take 75 mg by mouth every 8 (eight) hours.    Yes Historical Provider, MD  insulin detemir (LEVEMIR) 100 UNIT/ML injection Inject 0.04 mLs (4 Units total) into the skin at bedtime. 07/13/15  Yes Samuella Cota, MD  isosorbide mononitrate (IMDUR) 30 MG 24 hr tablet Take 0.5 tablets (15 mg total) by mouth every morning. 01/09/15  Yes Lezlie Octave Black, NP  levETIRAcetam (KEPPRA) 100 MG/ML solution Take 500 mg by mouth 2 (two) times daily.  09/29/14  Yes Historical Provider, MD  linagliptin (TRADJENTA) 5 MG TABS tablet Take 5 mg by mouth every morning.    Yes Historical Provider, MD  Maltodextrin-Xanthan Gum (RESOURCE THICKENUP CLEAR) POWD Take 120 g by mouth as needed. Patient taking differently: Take 1 Can by mouth daily.  01/09/15  Yes Lezlie Octave Black, NP  metoprolol succinate (TOPROL-XL) 100 MG 24 hr tablet Take 50 mg by mouth daily. Take with or immediately following a meal.   Yes Historical Provider, MD  mupirocin ointment (BACTROBAN) 2 % Apply 1 application topically 2 (two) times daily.  01/03/15  Yes Historical Provider, MD  pantoprazole (PROTONIX) 40 MG tablet Take 40 mg by mouth every morning.   Yes Historical Provider, MD  potassium chloride (K-DUR) 10 MEQ tablet Take 2 tablets by mouth daily. 07/25/15  Yes Historical Provider, MD  pravastatin (PRAVACHOL) 20 MG tablet Take 20 mg by mouth at bedtime.    Yes Historical Provider, MD  torsemide (DEMADEX) 20 MG tablet Take 20 mg by mouth daily.   Yes Historical Provider, MD   No Known  Allergies CBC:    Component Value Date/Time  WBC 3.4* 08/16/2015 0603   HGB 9.8* 08/16/2015 0603   HCT 31.3* 08/16/2015 0603   PLT 167 08/16/2015 0603   MCV 88.2 08/16/2015 0603   NEUTROABS 3.5 08/13/2015 1328   LYMPHSABS 0.7 08/13/2015 1328   MONOABS 0.5 08/13/2015 1328   EOSABS 0.0 08/13/2015 1328   BASOSABS 0.0 08/13/2015 1328   Comprehensive Metabolic Panel:    Component Value Date/Time   NA 139 08/17/2015 0606   K 4.3 08/17/2015 0606   CL 108 08/17/2015 0606   CO2 22 08/17/2015 0606   BUN 92* 08/17/2015 0606   CREATININE 3.77* 08/17/2015 0606   GLUCOSE 62* 08/17/2015 0606   CALCIUM 7.7* 08/17/2015 0606   CALCIUM 8.3* 04/28/2014 0815   AST 22 08/14/2015 0613   ALT 15 08/14/2015 0613   ALKPHOS 84 08/14/2015 0613   BILITOT 0.7 08/14/2015 0613   PROT 8.2* 08/14/2015 0613   ALBUMIN 3.3* 08/14/2015 9381    Physical Exam: Vital Signs: BP 152/70 mmHg  Pulse 69  Temp(Src) 97.8 F (36.6 C) (Oral)  Resp 20  Ht 5\' 4"  (1.626 m)  Wt 63.3 kg (139 lb 8.8 oz)  BMI 23.94 kg/m2  SpO2 100% SpO2: SpO2: 100 % O2 Device: O2 Device: Nasal Cannula O2 Flow Rate: O2 Flow Rate (L/min): 2 L/min Intake/output summary:  Intake/Output Summary (Last 24 hours) at 08/17/15 1040 Last data filed at 08/17/15 0844  Gross per 24 hour  Intake 1901.67 ml  Output   2300 ml  Net -398.33 ml   LBM: Last BM Date: 08/15/15 Baseline Weight: Weight: 63.05 kg (139 lb) Most recent weight: Weight: 63.3 kg (139 lb 8.8 oz)  Exam Findings:  Constitutional:  Frail, weak, lying in bed,  Makes and keeps eye contact.  Resp:  Even, non labored, chronic wet cough,  GI: abd soft, non tender.  Psych: calm.          Palliative Performance Scale:              PPS 6 months ago: 50% PPS today:  30%  Additional Data Reviewed: Recent Labs     08/15/15  0729  08/16/15  0603  08/17/15  0606  WBC  4.1  3.4*   --   HGB  10.0*  9.8*   --   PLT  174  167   --   NA  140  139  139  BUN  102*  95*  92*    CREATININE  4.63*  4.15*  3.77*     Time In:  1145 Time Out: 1315 Time Total:  90 minutes Greater than 50%  of this time was spent counseling and coordinating care related to the above assessment and plan. GOC discussion shared with nursing staff, CM, SW, and Dr. Roderic Palau.  Signed by: Drue Novel, NP  Drue Novel, NP  08/17/2015, 10:40 AM  Please contact Palliative Medicine Team phone at [phone] for questions and concerns.

## 2015-08-18 DIAGNOSIS — Z7189 Other specified counseling: Secondary | ICD-10-CM

## 2015-08-18 LAB — GLUCOSE, CAPILLARY
GLUCOSE-CAPILLARY: 120 mg/dL — AB (ref 65–99)
GLUCOSE-CAPILLARY: 173 mg/dL — AB (ref 65–99)
Glucose-Capillary: 125 mg/dL — ABNORMAL HIGH (ref 65–99)
Glucose-Capillary: 134 mg/dL — ABNORMAL HIGH (ref 65–99)

## 2015-08-18 LAB — BASIC METABOLIC PANEL
ANION GAP: 10 (ref 5–15)
BUN: 85 mg/dL — ABNORMAL HIGH (ref 6–20)
CALCIUM: 7.2 mg/dL — AB (ref 8.9–10.3)
CO2: 21 mmol/L — ABNORMAL LOW (ref 22–32)
Chloride: 110 mmol/L (ref 101–111)
Creatinine, Ser: 3.3 mg/dL — ABNORMAL HIGH (ref 0.44–1.00)
GFR, EST AFRICAN AMERICAN: 15 mL/min — AB (ref >60)
GFR, EST NON AFRICAN AMERICAN: 13 mL/min — AB (ref >60)
GLUCOSE: 126 mg/dL — AB (ref 65–99)
Potassium: 4.6 mmol/L (ref 3.5–5.1)
Sodium: 141 mmol/L (ref 135–145)

## 2015-08-18 LAB — CULTURE, BLOOD (ROUTINE X 2)
CULTURE: NO GROWTH
Culture: NO GROWTH

## 2015-08-18 MED ORDER — FUROSEMIDE 10 MG/ML IJ SOLN
40.0000 mg | Freq: Every day | INTRAMUSCULAR | Status: DC
  Administered 2015-08-18: 40 mg via INTRAVENOUS
  Filled 2015-08-18: qty 4

## 2015-08-18 NOTE — Progress Notes (Signed)
Subjective: Patient with some cough but no sputum production  Objective: Vital signs in last 24 hours: Temp:  [97.8 F (36.6 C)-98.4 F (36.9 C)] 98.1 F (36.7 C) (09/24 0512) Pulse Rate:  [70-90] 90 (09/24 0512) Resp:  [20-21] 20 (09/24 0512) BP: (145-153)/(58-75) 153/73 mmHg (09/24 0512) SpO2:  [99 %-100 %] 99 % (09/24 0512)  Intake/Output from previous day: 09/23 0701 - 09/24 0700 In: 240 [P.O.:240] Out: 4200 [Urine:4200] Intake/Output this shift: Total I/O In: 60 [P.O.:60] Out: -    Recent Labs  08/16/15 0603  HGB 9.8*    Recent Labs  08/16/15 0603  WBC 3.4*  RBC 3.55*  HCT 31.3*  PLT 167    Recent Labs  08/17/15 0606 08/18/15 0527  NA 139 141  K 4.3 4.6  CL 108 110  CO2 22 21*  BUN 92* 85*  CREATININE 3.77* 3.30*  GLUCOSE 62* 126*  CALCIUM 7.7* 7.2*   No results for input(s): LABPT, INR in the last 72 hours.  Generally patient is alert and in no apparent distress Chest she has some Coarse inspiratory rhonchi. Heart exam revealed regular rate and rhythm Extremities no edema  Assessment/Plan: Problem #1 acute kidney injury superimposed on chronic. Etiology was thought to be secondary to prerenal syndrome versus ATN. Patient with 4200 mL of urine output. Presently her BUN and creatinine is progressively improving. No signs of fluid over load. Returning to her base line Problem #2 chronic renal failure: Thought to be secondary to diabetes/hypertension/recurrent acute kidney injury/ischemic. She is stage IV chronic renal failure. Problem #3 pneumonia: She is on antibiotics and afebrile Problem #4 anemia: A combination of iron deficiency and anemia of chronic disease. Her hemoglobin is slightly lower than our target goal. Problem #5 metabolic bone disease her calcium and her phosphorus is in range. Problem #6 metabolic acidosis: Patient is on sodium bicarbonate her CO2 is improving.  Problem #7 hyperkalemia: Potassium has corrected Plan: 1] Continue  with present treatment 2] we'll check her basic metabolic panel in the morning. 3] Decrease he lasix to 40 mg iv once a day  BEFEKADU,BELAYENH S 08/18/2015, 10:04 AM

## 2015-08-18 NOTE — Plan of Care (Signed)
Problem: Phase II Progression Outcomes Goal: Discharge plan established Outcome: Progressing Since the family plans to take the patient home at discharge.  I will continue to add education in regards to aspiration precautions.

## 2015-08-18 NOTE — Progress Notes (Signed)
[facility] PROGRESS NOTE  [name] RSW:[id number] DOB: [birth year] DOA: 08/13/2015 PCP: [name], MD  Assessment/Plan: 1. Aspiration pneumonia in the right lower lobe per xray in setting of chronic respiratory failure. Concern for chronic aspiration as she has a history of choking episodes and in view of her dementia. Speech therapy consulted 9/21 and recommended puree diet with honey thick liquids, however staff resported patient may be aspirating puree food. Discussed plan with daughter. Palliative Care consulted the family 9/23 and discussed goals of care. It does not appear they are ready for hospice at this time. They did agree not to place a feeding tube, which I think is very reasonable.  She remains afebrile and non-toxic appearing. Blood cultures show no growth to date, final results are still pending. Oxygen saturation stable on 2L. Will continue Ceftazidime, but can possibly discontinue tomorrow.  2. Acute metabolic encephalopathy superimposed on baseline dementia, resolved. Patient appears to be back to baseline. Will continue current treatments.  3. Acute renal failure superimposed on CKD Stage IV. Creatinine continues to improve. Evaluated by nephrology who believes it is likely prerenal syndrome.She is currently on IV Lasix They recommend to continue with present treatment and has decreased IV lasix to 40mg .Foley catheter placed to monitor I&Os, which seems adequate.  4. Hyperkalemia. Related to #2 and supplements. Resolved.  5. Diabetes. Blood sugars are stable. Levamir discontinued. Continue to monitor and adjust sliding scale of insulin. 6. Dementia. Appears to be stable and at baseline. Will continue with Aricept. 7. History of chronic systolic heart failure. ProBNP >4500. Chest xray with mild vascular congestion. Echocardiogram 1 month ago shows ejection fraction 55-60% with evidence of diastolic dysfunction. Will continue IV lasix per nephrology 8. Anemia of  chronic disease: stable. Seen by heme 08/13/15. On ESA therapy. 9. Seizure disorder. Will continue Keppra. 10. Discussion. Two daughter were present for PMT consultation. There is no HCPOA, and the patient is unable lacks the capacity to designate one. Daughters stated that if comfort care is pursued they believe their mother would prefer to be home vs. SNF. Will call daughter later today.  Code Status: DNR DVT prophylaxis:Lovenox Family Communication: No family at bedside. Discussed with patient who understands and has no concerns at this time. Disposition Plan: Discharge upon improvement.   Consultants:  Nephrology-  Speech Therapy   PMT  Procedures:  None  Antibiotics:  Ceftazidime 9/19>>  Vancomycin 9/19/>>9/21  HPI/Subjective: Is doing fine and resting. Was able to eat breakfast.  Objective: Filed Vitals:   08/18/15 0512  BP: 153/73  Pulse: 90  Temp: 98.1 F (36.7 C)  Resp: 20    Intake/Output Summary (Last 24 hours) at 08/18/15 0730 Last data filed at 08/18/15 0513  Gross per 24 hour  Intake    240 ml  Output   3500 ml  Net  -3260 ml   Filed Weights   08/13/15 1245 08/13/15 1742  Weight: 63.05 kg (139 lb) 63.3 kg (139 lb 8.8 oz)    Exam: General: NAD, looks comfortable Cardiovascular: RRR, S1, S2  Respiratory: Course breath sound bilaterally with mild wheezes. No rales or rhnochi Abdomen: soft, non tender, no distention , bowel sounds normal Musculoskeletal: Trace edema b/l  Data Reviewed: Basic Metabolic Panel:  Recent Labs Lab 08/13/15 1328 08/14/15 0613 08/15/15 0729 08/16/15 0603 08/17/15 0606  NA 145 146* 140 139 139  K 5.8* 5.7* 5.4* 4.7 4.3  CL 123* 124* 117* 112* 108  CO2 15* 17* 16* 21* 22  GLUCOSE  134* 97 99 83 62*  BUN 90* 104* 102* 95* 92*  CREATININE 4.78* 4.61* 4.63* 4.15* 3.77*  CALCIUM 8.7* 8.7* 8.1* 8.0* 7.7*  MG  --  2.2  --   --   --   PHOS  --   --  4.9*  --   --    Liver Function Tests:  Recent Labs Lab  08/14/15 0613  AST 22  ALT 15  ALKPHOS 84  BILITOT 0.7  PROT 8.2*  ALBUMIN 3.3*   CBC:  Recent Labs Lab 08/13/15 1328 08/14/15 0613 08/15/15 0729 08/16/15 0603  WBC 4.8 4.1 4.1 3.4*  NEUTROABS 3.5  --   --   --   HGB 9.7* 10.0* 10.0* 9.8*  HCT 32.9* 33.2* 32.5* 31.3*  MCV 91.1 90.7 90.8 88.2  PLT 186 182 174 167   Cardiac Enzymes:  Recent Labs Lab 08/13/15 1328 08/13/15 1519  TROPONINI 0.06* 0.05*   BNP (last 3 results)  Recent Labs  05/28/15 1148 07/03/15 1514 08/13/15 1328  BNP 1084.0* 3737.0* >4500.0*    ProBNP (last 3 results)   CBG:  Recent Labs Lab 08/17/15 0758 08/17/15 0847 08/17/15 1126 08/17/15 1708 08/17/15 2157  GLUCAP 56* 66 75 110* 148*    Recent Results (from the past 240 hour(s))  Culture, blood (routine x 2)     Status: None (Preliminary result)   Collection Time: 08/13/15  1:50 PM  Result Value Ref Range Status   Specimen Description BLOOD RIGHT ANTECUBITAL  Final   Special Requests BOTTLES DRAWN AEROBIC AND ANAEROBIC 5CC EACH  Final   Culture NO GROWTH 4 DAYS  Final   Report Status PENDING  Incomplete  Culture, blood (routine x 2)     Status: None (Preliminary result)   Collection Time: 08/13/15  2:00 PM  Result Value Ref Range Status   Specimen Description BLOOD LEFT ANTECUBITAL  Final   Special Requests BOTTLES DRAWN AEROBIC AND ANAEROBIC 5CC EACH  Final   Culture NO GROWTH 4 DAYS  Final   Report Status PENDING  Incomplete  Culture, sputum-assessment     Status: None   Collection Time: 08/14/15  4:00 AM  Result Value Ref Range Status   Specimen Description TRACHEAL ASPIRATE  Final   Special Requests NONE  Final   Sputum evaluation   Final    THIS SPECIMEN IS ACCEPTABLE. RESPIRATORY CULTURE REPORT TO FOLLOW. Performed at [facility]    Report Status 08/14/2015 FINAL  Final  Culture, respiratory (NON-Expectorated)     Status: None   Collection Time: 08/14/15  4:00 AM  Result Value Ref Range Status    Specimen Description TRACHEAL ASPIRATE  Final   Special Requests NONE  Final   Gram Stain   Final    RARE WBC PRESENT, PREDOMINANTLY PMN ABUNDANT SQUAMOUS EPITHELIAL CELLS PRESENT FEW GRAM POSITIVE COCCI IN PAIRS IN CHAINS RARE GRAM NEGATIVE RODS Performed at [facility]    Culture   Final    NORMAL OROPHARYNGEAL FLORA Performed at [facility]    Report Status 08/17/2015 FINAL  Final     Studies: No results found.  Scheduled Meds: . cefTAZidime (FORTAZ)  IV  2 g Intravenous Q24H  . enoxaparin (LOVENOX) injection  30 mg Subcutaneous Q24H  . folic acid  1 mg Oral q morning - 10a  . furosemide  60 mg Intravenous BID  . insulin aspart  0-5 Units Subcutaneous QHS  . insulin aspart  0-9 Units Subcutaneous TID WC  .  levETIRAcetam  500 mg Intravenous Q12H  . linagliptin  5 mg Oral q morning - 10a  . metoprolol  5 mg Intravenous 4 times per day  . mupirocin ointment  1 application Topical BID  . pantoprazole (PROTONIX) IV  40 mg Intravenous Q24H  . RESOURCE THICKENUP CLEAR  1 Can Oral Daily  . sodium bicarbonate  650 mg Oral TID  . vancomycin  1,000 mg Intravenous Q48H   Continuous Infusions: .  sodium bicarbonate  infusion 1000 mL 100 mL/hr at 08/18/15 0112    Principal Problem:   HCAP (healthcare-associated pneumonia) Active Problems:   Hypertension   Diabetes mellitus   Seizure disorder   Iron deficiency anemia   Dementia   Severe mitral regurgitation   Cardiomyopathy, ischemic   Acute renal failure superimposed on stage 4 chronic kidney disease   Chronic combined systolic and diastolic CHF (congestive heart failure)   Anemia in chronic kidney disease (CKD)   Healthcare-associated pneumonia   Palliative care encounter   DNR (do not resuscitate) discussion    Time spent: 34 minutes     Kathie Dike, MD  [facility] Pager [phone]. If 7PM-7AM, please contact night-coverage at [email or web address], password [facility] 08/18/2015, 7:30 AM  LOS: 5  days    By signing my name below, I, Rennis Harding, attest that this documentation has been prepared under the direction and in the presence of Kathie Dike, MD. Electronically signed: Rennis Harding, Scribe. 08/18/2015 11:13 AM   I, Dr. Kathie Dike, personally performed the services described in this documentaiton. All medical record entries made by the scribe were at my direction and in my presence. I have reviewed the chart and agree that the record reflects my personal performance and is accurate and complete  Kathie Dike, MD, 08/18/2015 7:30 AM

## 2015-08-19 DIAGNOSIS — R1314 Dysphagia, pharyngoesophageal phase: Secondary | ICD-10-CM | POA: Diagnosis present

## 2015-08-19 LAB — GLUCOSE, CAPILLARY
GLUCOSE-CAPILLARY: 98 mg/dL (ref 65–99)
GLUCOSE-CAPILLARY: 109 mg/dL — AB (ref 65–99)
Glucose-Capillary: 144 mg/dL — ABNORMAL HIGH (ref 65–99)
Glucose-Capillary: 138 mg/dL — ABNORMAL HIGH (ref 65–99)

## 2015-08-19 LAB — BASIC METABOLIC PANEL
Anion gap: 9 (ref 5–15)
BUN: 81 mg/dL — ABNORMAL HIGH (ref 6–20)
CHLORIDE: 107 mmol/L (ref 101–111)
CO2: 27 mmol/L (ref 22–32)
CREATININE: 2.92 mg/dL — AB (ref 0.44–1.00)
Calcium: 7.3 mg/dL — ABNORMAL LOW (ref 8.9–10.3)
GFR, EST AFRICAN AMERICAN: 17 mL/min — AB (ref >60)
GFR, EST NON AFRICAN AMERICAN: 15 mL/min — AB (ref >60)
Glucose, Bld: 116 mg/dL — ABNORMAL HIGH (ref 65–99)
POTASSIUM: 4.1 mmol/L (ref 3.5–5.1)
Sodium: 143 mmol/L (ref 135–145)

## 2015-08-19 NOTE — Progress Notes (Signed)
1059 Foley catheter removed as ordered. 200cc straw yellow urine noted in catheter drainage bag. Patient tolerated well w/no c/o pain or discomfort noted. Telemetry monitor & wiring removed from patient as ordered, central telemetry called and made aware.

## 2015-08-19 NOTE — Progress Notes (Signed)
Subjective: Patient offer no complaint. No difficulty in breathing  Objective: Vital signs in last 24 hours: Temp:  [97.4 F (36.3 C)-98.4 F (36.9 C)] 98.4 F (36.9 C) (09/25 0603) Pulse Rate:  [88-92] 88 (09/25 0603) Resp:  [18-20] 18 (09/25 0603) BP: (161-166)/(77-90) 166/77 mmHg (09/25 0603) SpO2:  [99 %] 99 % (09/25 0603)  Intake/Output from previous day: 09/24 0701 - 09/25 0700 In: 300 [P.O.:300] Out: 1000 [Urine:1000] Intake/Output this shift: Total I/O In: 120 [P.O.:120] Out: 1000 [Urine:1000]  No results for input(s): HGB in the last 72 hours. No results for input(s): WBC, RBC, HCT, PLT in the last 72 hours.  Recent Labs  08/18/15 0527 08/19/15 0547  NA 141 143  K 4.6 4.1  CL 110 107  CO2 21* 27  BUN 85* 81*  CREATININE 3.30* 2.92*  GLUCOSE 126* 116*  CALCIUM 7.2* 7.3*   No results for input(s): LABPT, INR in the last 72 hours.  Generally patient is alert and in no apparent distress Chest she has some Coarse inspiratory rhonchi. Heart exam revealed regular rate and rhythm Extremities no edema  Assessment/Plan: Problem #1 acute kidney injury superimposed on chronic. Etiology was thought to be secondary to prerenal syndrome versus ATN. Presently her BUN and creatinine is progressively improving. Her renal function has returned to his base line. She is none oliguric Problem #2 chronic renal failure: Thought to be secondary to diabetes/hypertension/recurrent acute kidney injury/ischemic. She is stage IV chronic renal failure. Problem #3 pneumonia Problem #4 anemia: A combination of iron deficiency and anemia of chronic disease. Her hemoglobin is slightly lower than our target goal. Problem #5 metabolic bone disease her calcium and her phosphorus is in range. Problem #6 metabolic acidosis: Has corrected Problem #7 hyperkalemia: Potassium has corrected Plan: 1] d/c lasix 2] d/c folly catheter 3] change her ivf to 50 cc/hr 4] we'll check her basic metabolic  panel in the morning. 5] If D/C I will see  Her in my office in 4 weeks   BEFEKADU,BELAYENH S 08/19/2015, 10:17 AM

## 2015-08-19 NOTE — Progress Notes (Signed)
[facility] PROGRESS NOTE  [name] XUX:[id number] DOB: 1941-05-17 DOA: 08/13/2015 PCP: [name], MD  Assessment/Plan: 1. Aspiration pneumonia in the right lower lobe per xray in setting of chronic respiratory failure. Concern for chronic aspiration as she has a history of choking episodes and in view of her dementia. Speech therapy consulted 9/21 and recommended puree diet with honey thick liquids, however staff resported patient may be aspirating puree food. Discussed plan with daughter. Palliative Care consulted the family 9/23 and discussed goals of care. It does not appear they are ready for hospice at this time. They did agree not to place a feeding tube, which I think is very reasonable.  She remains afebrile and non-toxic appearing. Blood cultures show no growth to date, final results are still pending. Oxygen saturation stable on 2L. She has completed a course of abx in the hospital and appears to be tolerating a modified diet.   2. Acute metabolic encephalopathy superimposed on baseline dementia, resolved. Patient appears to be back to baseline. Will continue current treatments.  3. Acute renal failure superimposed on CKD Stage IV. Creatinine continues to improve. Evaluated by nephrology who believes it is likely prerenal syndrome. They recommend discontinuing Lasix and foley catheter and changing IVF to 50cc/hr.  4. Hyperkalemia. Related to #2 and supplements. Resolved.  5. Diabetes. Blood sugars are stable. Levamir discontinued. Continue to monitor and adjust sliding scale of insulin. 6. Dementia. Appears to be stable and at baseline. Will continue with Aricept. 7. History of chronic systolic heart failure. ProBNP >4500. Chest xray with mild vascular congestion. Echocardiogram 1 month ago shows ejection fraction 55-60% with evidence of diastolic dysfunction. She appears to be euvolemic. 8. Anemia of chronic disease: stable. Seen by heme 08/13/15. On ESA  therapy. 9. Seizure disorder. Will continue Keppra. 10. Discussion. Two daughters were present for PMT consultation. There is no HCPOA, and the patient lacks the capacity to designate one. Daughters stated that if comfort care is pursued they believe their mother would prefer to be home vs. SNF.   Code Status: DNR DVT prophylaxis:Lovenox Family Communication: No family at bedside. Discussed with patient who understands and has no concerns at this time. Disposition Plan: Anticipate discharge home within 24 hours.   Consultants:  Nephrology-Belayenh Lowanda Foster, MD  Speech Therapy   PMT  Procedures:  None  Antibiotics:  Ceftazidime 9/19>>9/25  Vancomycin 9/19/>>9/21  HPI/Subjective: Feels improved. Denies SOB or cough. Able to eat breakfast this morning.  Objective: Filed Vitals:   08/19/15 0603  BP: 166/77  Pulse: 88  Temp: 98.4 F (36.9 C)  Resp: 18    Intake/Output Summary (Last 24 hours) at 08/19/15 1041 Last data filed at 08/19/15 0830  Gross per 24 hour  Intake    360 ml  Output   2000 ml  Net  -1640 ml   Filed Weights   08/13/15 1245 08/13/15 1742  Weight: 63.05 kg (139 lb) 63.3 kg (139 lb 8.8 oz)    Exam:  General: NAD, looks comfortable Cardiovascular: RRR, S1, S2  Respiratory: clear bilaterally, No wheezing, rales or rhonchi Abdomen: soft, non tender, no distention , bowel sounds normal Musculoskeletal: No edema b/l   Data Reviewed: Basic Metabolic Panel:  Recent Labs Lab 08/14/15 0613 08/15/15 0729 08/16/15 0603 08/17/15 0606 08/18/15 0527 08/19/15 0547  NA 146* 140 139 139 141 143  K 5.7* 5.4* 4.7 4.3 4.6 4.1  CL 124* 117* 112* 108 110 107  CO2 17* 16* 21* 22 21* 27  GLUCOSE  97 99 83 62* 126* 116*  BUN 104* 102* 95* 92* 85* 81*  CREATININE 4.61* 4.63* 4.15* 3.77* 3.30* 2.92*  CALCIUM 8.7* 8.1* 8.0* 7.7* 7.2* 7.3*  MG 2.2  --   --   --   --   --   PHOS  --  4.9*  --   --   --   --    Liver Function Tests:  Recent Labs Lab  08/14/15 0613  AST 22  ALT 15  ALKPHOS 84  BILITOT 0.7  PROT 8.2*  ALBUMIN 3.3*   CBC:  Recent Labs Lab 08/13/15 1328 08/14/15 0613 08/15/15 0729 08/16/15 0603  WBC 4.8 4.1 4.1 3.4*  NEUTROABS 3.5  --   --   --   HGB 9.7* 10.0* 10.0* 9.8*  HCT 32.9* 33.2* 32.5* 31.3*  MCV 91.1 90.7 90.8 88.2  PLT 186 182 174 167   Cardiac Enzymes:  Recent Labs Lab 08/13/15 1328 08/13/15 1519  TROPONINI 0.06* 0.05*   BNP (last 3 results)  Recent Labs  05/28/15 1148 07/03/15 1514 08/13/15 1328  BNP 1084.0* 3737.0* >4500.0*    ProBNP (last 3 results)   CBG:  Recent Labs Lab 08/18/15 0756 08/18/15 1130 08/18/15 1617 08/18/15 2027 08/19/15 0747  GLUCAP 120* 125* 134* 173* 98    Recent Results (from the past 240 hour(s))  Culture, blood (routine x 2)     Status: None   Collection Time: 08/13/15  1:50 PM  Result Value Ref Range Status   Specimen Description BLOOD RIGHT ANTECUBITAL  Final   Special Requests BOTTLES DRAWN AEROBIC AND ANAEROBIC 5CC EACH  Final   Culture NO GROWTH 5 DAYS  Final   Report Status 08/18/2015 FINAL  Final  Culture, blood (routine x 2)     Status: None   Collection Time: 08/13/15  2:00 PM  Result Value Ref Range Status   Specimen Description BLOOD LEFT ANTECUBITAL  Final   Special Requests BOTTLES DRAWN AEROBIC AND ANAEROBIC 5CC EACH  Final   Culture NO GROWTH 5 DAYS  Final   Report Status 08/18/2015 FINAL  Final  Culture, sputum-assessment     Status: None   Collection Time: 08/14/15  4:00 AM  Result Value Ref Range Status   Specimen Description TRACHEAL ASPIRATE  Final   Special Requests NONE  Final   Sputum evaluation   Final    THIS SPECIMEN IS ACCEPTABLE. RESPIRATORY CULTURE REPORT TO FOLLOW. Performed at [facility]    Report Status 08/14/2015 FINAL  Final  Culture, respiratory (NON-Expectorated)     Status: None   Collection Time: 08/14/15  4:00 AM  Result Value Ref Range Status   Specimen Description TRACHEAL  ASPIRATE  Final   Special Requests NONE  Final   Gram Stain   Final    RARE WBC PRESENT, PREDOMINANTLY PMN ABUNDANT SQUAMOUS EPITHELIAL CELLS PRESENT FEW GRAM POSITIVE COCCI IN PAIRS IN CHAINS RARE GRAM NEGATIVE RODS Performed at [facility]    Culture   Final    NORMAL OROPHARYNGEAL FLORA Performed at [facility]    Report Status 08/17/2015 FINAL  Final     Studies: No results found.  Scheduled Meds: . enoxaparin (LOVENOX) injection  30 mg Subcutaneous Q24H  . folic acid  1 mg Oral q morning - 10a  . insulin aspart  0-5 Units Subcutaneous QHS  . insulin aspart  0-9 Units Subcutaneous TID WC  . levETIRAcetam  500 mg Intravenous Q12H  . linagliptin  5  mg Oral q morning - 10a  . metoprolol  5 mg Intravenous 4 times per day  . mupirocin ointment  1 application Topical BID  . pantoprazole (PROTONIX) IV  40 mg Intravenous Q24H  . RESOURCE THICKENUP CLEAR  1 Can Oral Daily  . sodium bicarbonate  650 mg Oral TID   Continuous Infusions: .  sodium bicarbonate  infusion 1000 mL 100 mL/hr at 08/18/15 2321    Principal Problem:   HCAP (healthcare-associated pneumonia) Active Problems:   Hypertension   Diabetes mellitus   Seizure disorder   Iron deficiency anemia   Dementia   Severe mitral regurgitation   Cardiomyopathy, ischemic   Acute renal failure superimposed on stage 4 chronic kidney disease   Chronic combined systolic and diastolic CHF (congestive heart failure)   Anemia in chronic kidney disease (CKD)   Healthcare-associated pneumonia   Palliative care encounter   DNR (do not resuscitate) discussion    Time spent: 1 minutes     Kathie Dike, MD  [facility] Pager [phone]. If 7PM-7AM, please contact night-coverage at [email or web address], password [facility] 08/19/2015, 10:41 AM  LOS: 6 days    By signing my name below, I, Rosalie Doctor, attest that this documentation has been prepared under the direction and in the presence of [facility]. MD Electronically Signed: Rosalie Doctor, Scribe. 08/19/2015 10:38 am  I, Dr. Kathie Dike, personally performed the services described in this documentaiton. All medical record entries made by the scribe were at my direction and in my presence. I have reviewed the chart and agree that the record reflects my personal performance and is accurate and complete  Kathie Dike, MD, 08/19/2015 10:44 AM

## 2015-08-20 ENCOUNTER — Encounter (HOSPITAL_COMMUNITY): Payer: Self-pay

## 2015-08-20 DIAGNOSIS — R1314 Dysphagia, pharyngoesophageal phase: Secondary | ICD-10-CM

## 2015-08-20 LAB — BASIC METABOLIC PANEL
Anion gap: 8 (ref 5–15)
BUN: 80 mg/dL — AB (ref 6–20)
CALCIUM: 7.1 mg/dL — AB (ref 8.9–10.3)
CHLORIDE: 110 mmol/L (ref 101–111)
CO2: 26 mmol/L (ref 22–32)
CREATININE: 2.79 mg/dL — AB (ref 0.44–1.00)
GFR calc Af Amer: 18 mL/min — ABNORMAL LOW (ref >60)
GFR calc non Af Amer: 16 mL/min — ABNORMAL LOW (ref >60)
Glucose, Bld: 100 mg/dL — ABNORMAL HIGH (ref 65–99)
Potassium: 4.4 mmol/L (ref 3.5–5.1)
SODIUM: 144 mmol/L (ref 135–145)

## 2015-08-20 LAB — GLUCOSE, CAPILLARY
Glucose-Capillary: 100 mg/dL — ABNORMAL HIGH (ref 65–99)
Glucose-Capillary: 95 mg/dL (ref 65–99)

## 2015-08-20 MED ORDER — TORSEMIDE 20 MG PO TABS: 20.0000 mg | ORAL_TABLET | Freq: Every day | ORAL | Status: AC | PRN

## 2015-08-20 NOTE — Progress Notes (Signed)
Daily Progress Note   Patient Name: [name]       Date: 08/20/2015 DOB: 05-09-1941  Age: 74 y.o. MRN#: [id number] Attending Physician: Kathie Dike, MD Primary Care Physician: [name], MD Admit Date: 08/13/2015  Reason for Consultation/Follow-up: Establishing goals of care and Psychosocial/spiritual support  Subjective: Mrs. Alamo is resting quietly in bed today after lunch.  She greets me when I say hello, makes and keeps eye contact.  She is pleasantly confused.  She denies pain or hunger.  She seems more alert today.  We talk about her going home and receiving hospice care in the home.  She tells me that she is ok with this.  She does have 2 episodes of coughing during our visit.  Call to daughter, [name] who returns my call later.  We talk about Mrs. Hoge's improvements and her scheduled dc.  [name] tells me that Mrs. Ficco sisters are going to help at home.  We also talk about the benefits of Hospice in the home and the benefits of hearing what they can and can not do for them.   [name] asks if her mother would have to go there, and I reassure her that the services will be provided in her home.  Vicky at times talks about calling Hospice if she can no longer handle her mothers care and I encourage her to not wait for services, we discuss the benefits of these services.   I share that Mrs. Lague had 2 episodes of coughing during my visit.  We again discuss that this is normal with her disease process and there is nothing to be done any differently.  I reassure [name] that she is a good caregiver for her mother and encourage her to call me if she has any questions about future care or hospice.    Length of Stay: 7 days  Current Medications: Scheduled Meds:  . enoxaparin (LOVENOX) injection  30 mg Subcutaneous Q24H  . folic acid  1 mg Oral q morning - 10a  . insulin aspart  0-5 Units Subcutaneous QHS  . insulin aspart  0-9 Units Subcutaneous TID WC  . levETIRAcetam  500  mg Intravenous Q12H  . linagliptin  5 mg Oral q morning - 10a  . metoprolol  5 mg Intravenous 4 times per day  . mupirocin ointment  1 application Topical BID  . pantoprazole (PROTONIX) IV  40 mg Intravenous Q24H  . RESOURCE THICKENUP CLEAR  1 Can Oral Daily  . sodium bicarbonate  650 mg Oral TID    Continuous Infusions: .  sodium bicarbonate  infusion 1000 mL 50 mL/hr at 08/19/15 1054    PRN Meds: albuterol  Palliative Performance Scale: 40%     Vital Signs: BP 163/75 mmHg  Pulse 62  Temp(Src) 98.1 F (36.7 C) (Oral)  Resp 20  Ht 5\' 4"  (1.626 m)  Wt 63.3 kg (139 lb 8.8 oz)  BMI 23.94 kg/m2  SpO2 98% SpO2: SpO2: 98 % O2 Device: O2 Device: Nasal Cannula O2 Flow Rate: O2 Flow Rate (L/min): 2 L/min  Intake/output summary:  Intake/Output Summary (Last 24 hours) at 08/20/15 1330 Last data filed at 08/19/15 1700  Gross per 24 hour  Intake    120 ml  Output      0 ml  Net    120 ml   LBM:   Baseline Weight: Weight: 63.05 kg (139 lb) Most recent weight: Weight: 63.3 kg (139 lb 8.8 oz)  Physical Exam: Constitutional: Frail,  elderly, lying in bed, makes and keeps eye contact.  Resp:  Even and non labored, wet non productive cough             GI:  abd soft rounded non tender Psych:  Calm pleasant, confused.   Additional Data Reviewed: Recent Labs     08/19/15  0547  08/20/15  0540  NA  143  144  BUN  81*  80*  CREATININE  2.92*  2.79*     Problem List:  Patient Active Problem List   Diagnosis Date Noted  . Dysphagia, pharyngoesophageal phase 08/19/2015  . Palliative care encounter   . DNR (do not resuscitate) discussion   . Aspiration pneumonia 08/13/2015  . SOB (shortness of breath)   . Uremia 05/28/2015  . CKD stage 4 due to type 2 diabetes mellitus 05/28/2015  . Normocytic anemia 05/28/2015  . Protein-calorie malnutrition, severe 04/28/2015  . Heme positive stool   . History of CVA (cerebrovascular accident) 04/25/2015  . Generalized weakness  04/25/2015  . Protein calorie malnutrition 04/25/2015  . Type I diabetes mellitus, well controlled 04/25/2015  . Essential hypertension 04/25/2015  . Chronic combined systolic and diastolic CHF (congestive heart failure) 04/25/2015  . GI bleed 04/25/2015  . Anemia in chronic kidney disease (CKD) 04/25/2015  . Acute on chronic renal failure   . Blood loss anemia   . UTI (urinary tract infection) 01/04/2015  . Anemia of chronic renal failure, stage 4 (severe) 01/02/2015  . Acute on chronic respiratory failure with hypoxia 01/02/2015  . Elevated troponin 01/02/2015  . AKI (acute kidney injury) 12/31/2014  . Dysphagia 12/24/2014  . Seizure 12/23/2014  . Acute renal failure 12/23/2014  . Anemia 12/23/2014  . Hypernatremia   . Swelling of both ankles   . Lower GI bleeding 10/22/2014  . Acute renal failure superimposed on stage 4 chronic kidney disease 07/22/2014  . Cardiomyopathy, ischemic 07/20/2014  . Choking episode 04/28/2014  . Hypoglycemia 03/16/2014  . CAP (community acquired pneumonia) 03/15/2014  . Severe mitral regurgitation 02/22/2014  . Acute on chronic combined systolic and diastolic congestive heart failure 02/22/2014  . Dementia 02/21/2014  . Diarrhea 02/20/2014  . Edema of right lower extremity 02/20/2014  . GI bleeding 01/16/2014  . Weakness generalized 01/16/2014  . Anemia associated with acute blood loss 01/16/2014  . NSTEMI (non-ST elevated myocardial infarction) 01/16/2014  . Systolic CHF, chronic last EF 40% 3/13 01/16/2014  . Iron deficiency anemia 01/16/2014  . Bilateral lower extremity edema 07/22/2013  . Hyperglycemia 12/21/2012  . CKD (chronic kidney disease) stage 4, GFR 15-29 ml/min 12/21/2012  . Diabetes 12/21/2012  . CVA (cerebral infarction) 04/19/2012  . Neutropenia 01/30/2012  . Seizure disorder 01/25/2012  . ARF (acute renal failure) 01/25/2012  . Acute CVA Left frontal lobe,   . Hypertension   . Diabetes mellitus      Palliative Care  Assessment & Plan    Code Status:  DNR  Goals of Care:  Home with [facility] services  Has Cap aid daily  Consult for Hospice after [facility] services completed.   Symptom Management:  No complaints of pain  Palliative Prophylaxis:  None: recommend Senna-S 1 tab QD.   Psycho-social/Spiritual:  Desire for further Chaplaincy support:no, d/c today.    Prognosis: Unable to determine Discharge Planning: Home with Home Health, consult with Hospice when [facility] services completed.    Care plan was discussed with nursing staff, CM, SW, and Dr. Roderic Palau.   Thank you for allowing the Palliative  Medicine Team to assist in the care of this patient.   Time In: 1215 Time Out: 1250 Total Time 73minutes Prolonged Time Billed  no     Greater than 50%  of this time was spent counseling and coordinating care related to the above assessment and plan.   Drue Novel, NP  08/20/2015, 1:30 PM  Please contact Palliative Medicine Team phone at [phone] for questions and concerns.

## 2015-08-20 NOTE — Progress Notes (Signed)
Subjective: Patient offer no complaint. Her appetite is good  Objective: Vital signs in last 24 hours: Temp:  [98.1 F (36.7 C)-98.3 F (36.8 C)] 98.1 F (36.7 C) (09/26 0703) Pulse Rate:  [62-77] 62 (09/26 0703) Resp:  [18-20] 20 (09/26 0703) BP: (141-163)/(58-82) 163/75 mmHg (09/26 0703) SpO2:  [98 %-100 %] 100 % (09/26 0703)  Intake/Output from previous day: 09/25 0701 - 09/26 0700 In: 360 [P.O.:360] Out: 1200 [Urine:1200] Intake/Output this shift:    No results for input(s): HGB in the last 72 hours. No results for input(s): WBC, RBC, HCT, PLT in the last 72 hours.  Recent Labs  08/19/15 0547 08/20/15 0540  NA 143 144  K 4.1 4.4  CL 107 110  CO2 27 26  BUN 81* 80*  CREATININE 2.92* 2.79*  GLUCOSE 116* 100*  CALCIUM 7.3* 7.1*   No results for input(s): LABPT, INR in the last 72 hours.  Generally patient is alert and in no apparent distress Chest she has some Coarse inspiratory rhonchi. Heart exam revealed regular rate and rhythm Extremities no edema  Assessment/Plan: Problem #1 acute kidney injury superimposed on chronic. Etiology was thought to be secondary to prerenal syndrome versus ATN. Her renal function is improving and has returned to his base line. She is none oliguric Problem #2 chronic renal failure: Thought to be secondary to diabetes/hypertension/recurrent acute kidney injury/ischemic. She is stage IV chronic renal failure. Problem #3 pneumonia Problem #4 anemia: A combination of iron deficiency and anemia of chronic disease. Her hemoglobin is slightly lower than our target goal. Stable Problem #5 metabolic bone disease her calcium and her phosphorus is in range. Problem #6 metabolic acidosis: Her CO2 is normal Problem #7 hyperkalemia: Potassium has corrected Plan: Continue with present treatemtn   [facility] 08/20/2015, 7:50 AM

## 2015-08-20 NOTE — Discharge Summary (Signed)
Physician Discharge Summary  [name] ZOX:[id number] DOB: 09-Aug-1941 DOA: 08/13/2015  PCP: [name], MD  Admit date: 08/13/2015 Discharge date: 08/20/2015  Time spent: 35 minutes  Recommendations for Outpatient Follow-up:  1. Follow up with PCP in 1-2 weeks.  2. Follow up with nephrology in 4 weeks. 3. A referral has been made to home Hospice.   Discharge Diagnoses:  Principal Problem:   Aspiration pneumonia Active Problems:   Hypertension   Diabetes mellitus   Seizure disorder   Iron deficiency anemia   Dementia   Severe mitral regurgitation   Cardiomyopathy, ischemic   Acute renal failure superimposed on stage 4 chronic kidney disease   Chronic combined systolic and diastolic CHF (congestive heart failure)   Anemia in chronic kidney disease (CKD)   Palliative care encounter   DNR (do not resuscitate) discussion   Dysphagia, pharyngoesophageal phase   Discharge Condition: Improved  Diet recommendation: Puree diet with honey thick liquids.   Filed Weights   08/13/15 1245 08/13/15 1742  Weight: 63.05 kg (139 lb) 63.3 kg (139 lb 8.8 oz)    History of present illness:  74 year old female with a hx of dementia, CHF, and COPD presented with SOB, a cough, and subjective fevers. CXR in the ED revealed right lower lobe PNA consistent with aspiration PNA. She was admitted for further management.   Hospital Course:  Aspiration pneumonia in the right lower lobe per xray in the setting of chronic respiratory failure.  Speech therapy consulted 9/21 and recommended puree diet with honey thick liquids, however  patient began aspirating puree food. Palliative Care consulted the family 9/23 and discussed goals of care. Family ultimately decided to not place a feeding tube. Throughout the course of her stay, she remained afebrile and non-toxic appearing. Blood cultures show no growth to date, final results are still pending. She has completed a course of abx in the hospital and  can now tolerate a modified diet.   1. Acute encephalopathy superimposed on baseline dementia, likely related to PNA. Resolved. Patient back to baseline.  2. Acute renal failure superimposed on CKD Stage IV, patient followed by nephrology and was placed on IVF and Lasix. She has had good UOP and renal function appears to be approaching baseline. She will follow up with nephrology in 4 weeks.Since her PO intake in inconsistent, will change Demadex to PRN. 3. Hyperkalemia. Related to #2 and supplements. Resolved.  4. Diabetes, stable. Continue home meds. 5. Dementia, at baseline. Continue home meds. 6. History of chronic systolic heart failure. ProBNP >4500.  7. Anemia of chronic disease: stable. Continue ESA therapy.  8. Seizure disorder. Continue Keppra. 9. Discussion: Patient and family met with Palliative care to discuss goals of care. It was explained to the family that with her dysphagia she would likely have recurrent aspiration. Feeding tube was not recommended and family is in agreement. A referral for home Hospice has been arranged as an outpatient although the family does not appear to be completely ready to accept Hospice services. She appears to be tolerating a modified diet at this time.   Procedures:  none  Consultations:  Nephrology-Belayenh Lowanda Foster, MD  Speech Therapy   PMT  Discharge Exam: Filed Vitals:   08/20/15 0703  BP: 163/75  Pulse: 62  Temp: 98.1 F (36.7 C)  Resp: 20    10. General: NAD, looks comfortable 11. Cardiovascular: RRR, S1, S2  12. Respiratory: coarse breath sounds mostly in the upper airways, No wheezing, rales or rhonchi 13. Abdomen:  soft, non tender, no distention , bowel sounds normal 14. Musculoskeletal: 1+ edema b/l   Discharge Instructions   Discharge Instructions    Diet - low sodium heart healthy    Complete by:  As directed      Increase activity slowly    Complete by:  As directed           Current Discharge  Medication List    CONTINUE these medications which have CHANGED   Details  torsemide (DEMADEX) 20 MG tablet Take 1 tablet (20 mg total) by mouth daily as needed.      CONTINUE these medications which have NOT CHANGED   Details  albuterol (PROVENTIL HFA;VENTOLIN HFA) 108 (90 BASE) MCG/ACT inhaler Inhale 2 puffs into the lungs every 6 (six) hours as needed for wheezing or shortness of breath. Qty: 1 Inhaler, Refills: 2    calcitRIOL (ROCALTROL) 0.25 MCG capsule Take 1 capsule (0.25 mcg total) by mouth daily. Qty: 31 capsule, Refills: 0    donepezil (ARICEPT) 10 MG tablet Take 10 mg by mouth at bedtime.     folic acid (FOLVITE) 1 MG tablet Take 1 mg by mouth every morning.     hydrALAZINE (APRESOLINE) 25 MG tablet Take 75 mg by mouth every 8 (eight) hours.     isosorbide mononitrate (IMDUR) 30 MG 24 hr tablet Take 0.5 tablets (15 mg total) by mouth every morning. Qty: 30 tablet, Refills: 0    levETIRAcetam (KEPPRA) 100 MG/ML solution Take 500 mg by mouth 2 (two) times daily.     linagliptin (TRADJENTA) 5 MG TABS tablet Take 5 mg by mouth every morning.     Maltodextrin-Xanthan Gum (RESOURCE THICKENUP CLEAR) POWD Take 120 g by mouth as needed.    metoprolol succinate (TOPROL-XL) 100 MG 24 hr tablet Take 50 mg by mouth daily. Take with or immediately following a meal.    mupirocin ointment (BACTROBAN) 2 % Apply 1 application topically 2 (two) times daily.     pantoprazole (PROTONIX) 40 MG tablet Take 40 mg by mouth every morning.    potassium chloride (K-DUR) 10 MEQ tablet Take 2 tablets by mouth daily.    pravastatin (PRAVACHOL) 20 MG tablet Take 20 mg by mouth at bedtime.       STOP taking these medications     insulin detemir (LEVEMIR) 100 UNIT/ML injection        No Known Allergies Follow-up Information    Follow up with [facility], MD In 4 weeks.   Specialty:  Nephrology   Contact information:   [address] [place] [address] [phone]         The results of significant diagnostics from this hospitalization (including imaging, microbiology, ancillary and laboratory) are listed below for reference.    Significant Diagnostic Studies: Dg Chest Portable 1 View  08/13/2015   CLINICAL DATA:  Shortness of breath  EXAM: PORTABLE CHEST - 1 VIEW  COMPARISON:  07/06/2015  FINDINGS: Moderate to severe cardiac enlargement. Patient is status post prior CABG. Mild vascular congestion. Left lung appears clear. Right diaphragm is elevated similar to prior study. There is mild hazy density in the right lower lobe as well as blunting of the right costophrenic angle.  IMPRESSION: Right lower lobe infiltrate with small right pleural effusion. Pneumonia/pneumonitis suspected.   Electronically Signed   By: Skipper Cliche M.D.   On: 08/13/2015 13:21    Microbiology: Recent Results (from the past 240 hour(s))  Culture, blood (routine x 2)  Status: None   Collection Time: 08/13/15  1:50 PM  Result Value Ref Range Status   Specimen Description BLOOD RIGHT ANTECUBITAL  Final   Special Requests BOTTLES DRAWN AEROBIC AND ANAEROBIC 5CC EACH  Final   Culture NO GROWTH 5 DAYS  Final   Report Status 08/18/2015 FINAL  Final  Culture, blood (routine x 2)     Status: None   Collection Time: 08/13/15  2:00 PM  Result Value Ref Range Status   Specimen Description BLOOD LEFT ANTECUBITAL  Final   Special Requests BOTTLES DRAWN AEROBIC AND ANAEROBIC 5CC EACH  Final   Culture NO GROWTH 5 DAYS  Final   Report Status 08/18/2015 FINAL  Final  Culture, sputum-assessment     Status: None   Collection Time: 08/14/15  4:00 AM  Result Value Ref Range Status   Specimen Description TRACHEAL ASPIRATE  Final   Special Requests NONE  Final   Sputum evaluation   Final    THIS SPECIMEN IS ACCEPTABLE. RESPIRATORY CULTURE REPORT TO FOLLOW. Performed at [facility]    Report Status 08/14/2015 FINAL  Final  Culture, respiratory (NON-Expectorated)     Status:  None   Collection Time: 08/14/15  4:00 AM  Result Value Ref Range Status   Specimen Description TRACHEAL ASPIRATE  Final   Special Requests NONE  Final   Gram Stain   Final    RARE WBC PRESENT, PREDOMINANTLY PMN ABUNDANT SQUAMOUS EPITHELIAL CELLS PRESENT FEW GRAM POSITIVE COCCI IN PAIRS IN CHAINS RARE GRAM NEGATIVE RODS Performed at [facility]    Culture   Final    NORMAL OROPHARYNGEAL FLORA Performed at [facility]    Report Status 08/17/2015 FINAL  Final     Labs: Basic Metabolic Panel:  Recent Labs Lab 08/14/15 4132 08/15/15 0729 08/16/15 0603 08/17/15 0606 08/18/15 0527 08/19/15 0547 08/20/15 0540  NA 146* 140 139 139 141 143 144  K 5.7* 5.4* 4.7 4.3 4.6 4.1 4.4  CL 124* 117* 112* 108 110 107 110  CO2 17* 16* 21* 22 21* 27 26  GLUCOSE 97 99 83 62* 126* 116* 100*  BUN 104* 102* 95* 92* 85* 81* 80*  CREATININE 4.61* 4.63* 4.15* 3.77* 3.30* 2.92* 2.79*  CALCIUM 8.7* 8.1* 8.0* 7.7* 7.2* 7.3* 7.1*  MG 2.2  --   --   --   --   --   --   PHOS  --  4.9*  --   --   --   --   --    Liver Function Tests:  Recent Labs Lab 08/14/15 0613  AST 22  ALT 15  ALKPHOS 84  BILITOT 0.7  PROT 8.2*  ALBUMIN 3.3*   CBC:  Recent Labs Lab 08/13/15 1328 08/14/15 0613 08/15/15 0729 08/16/15 0603  WBC 4.8 4.1 4.1 3.4*  NEUTROABS 3.5  --   --   --   HGB 9.7* 10.0* 10.0* 9.8*  HCT 32.9* 33.2* 32.5* 31.3*  MCV 91.1 90.7 90.8 88.2  PLT 186 182 174 167   Cardiac Enzymes:  Recent Labs Lab 08/13/15 1328 08/13/15 1519  TROPONINI 0.06* 0.05*   BNP: BNP (last 3 results)  Recent Labs  05/28/15 1148 07/03/15 1514 08/13/15 1328  BNP 1084.0* 3737.0* >4500.0*    CBG:  Recent Labs Lab 08/19/15 0747 08/19/15 1140 08/19/15 1708 08/19/15 2038 08/20/15 0715  GLUCAP 98 109* 144* 138* 100*     Signed:  Linsey Arteaga. MD  [facility] 08/20/2015, 8:55 AM  By signing my name below, I, Rosalie Doctor, attest that this documentation  has been prepared under the direction and in the presence of [facility]. MD Electronically Signed: Rosalie Doctor, Scribe. 08/20/2015   I, Dr. Kathie Dike, personally performed the services described in this documentaiton. All medical record entries made by the scribe were at my direction and in my presence. I have reviewed the chart and agree that the record reflects my personal performance and is accurate and complete  Kathie Dike, MD, 08/20/2015 9:07 AM

## 2015-08-20 NOTE — Care Management Note (Signed)
Case Management Note  Patient Details  Name: [name] MRN: [id number] Date of Birth: [birth year]  Expected Discharge Date:   08/20/2015               Expected Discharge Plan:  [place]  In-House Referral:  NA  Discharge planning Services  CM Consult  Post Acute Care Choice:  Resumption of Svcs/PTA Provider Choice offered to:  Adult Children, HC POA / Guardian  DME Arranged:    DME Agency:     HH Arranged:  RN [place] Agency:  [place]  Status of Service:  Completed, signed off  Medicare Important Message Given:  Yes-third notification given Date Medicare IM Given:    Medicare IM give by:    Date Additional Medicare IM Given:    Additional Medicare Important Message give by:     If discussed at [place] of Stay Meetings, dates discussed:    Additional Comments: Pt discharging home today with resumption of HH services through [facility]. [name], of [facility], made aware of discharge and will obtain pt info from chart. Daughter [name], aware HH has 48 hours to resume services. Referral also made for Hospice. With Vickie's, permission, pt info faxed to [name] spoke with Olean Ree at [facility] and they will make arrangements to visit with family later in the week. No further CM needs at this time.  Sherald Barge, RN 08/20/2015, 1:12 PM

## 2015-08-20 NOTE — Clinical Social Work Note (Signed)
Pt's daughter [name] requesting transport home via [facility]. Vicky confirms home address and that family would be present upon arrival.  Benay Pike, [place]

## 2015-08-20 NOTE — Care Management Important Message (Signed)
Important Message  Patient Details  Name: [name] MRN: [id number] Date of Birth: 02/19/1941   Medicare Important Message Given:  Yes-third notification given    Sherald Barge, RN 08/20/2015, 11:25 AM

## 2015-08-24 ENCOUNTER — Other Ambulatory Visit: Payer: Self-pay | Admitting: *Deleted

## 2015-08-24 NOTE — Patient Outreach (Signed)
Outreach call to patient daughter [name] for Transition of Care Week #1  No answer, unable to leave a message. Will attempt to call again later. [name]. [name], RN, BSN, [place] [phone]

## 2015-08-30 ENCOUNTER — Other Ambulatory Visit: Payer: Self-pay | Admitting: *Deleted

## 2015-08-30 NOTE — Patient Outreach (Signed)
Transition of Care call Week #2  Call to daughter [name], regarding patient recent hospitalization. Daughter stating patient was sent to hospital ED from Cancer center. She ended up with pneumonia per daughter "from something she drank" Patient and family had Hospice consult but they decline services at this time.  Patient is eating better but daughter states she is on pureed low sodium diet with thickened liquids. Patient eating well. No shortness of breath or pain, there is some swelling in feet and legs that daughter reports patienbt has had since her hospitalization.Patient unable to weigh as she is too weak and is now bed and chair bound.  Patient still has home health services. Patient has follow up appointment with cardiologist next week, daughter needs to re-schedule appointment with cancer center.   RNCM scheduled visit for next week for ongoing [facility] CM community services.  [name]. [name], RN, BSN, [place] [phone]

## 2015-09-03 ENCOUNTER — Other Ambulatory Visit: Payer: Self-pay | Admitting: *Deleted

## 2015-09-03 ENCOUNTER — Ambulatory Visit: Payer: Self-pay | Admitting: *Deleted

## 2015-09-03 NOTE — Patient Outreach (Signed)
Call from patient daughter requesting that [facility] reschedule visit for tomorrow instead of today, they have a personal conflict. Rescheduled for 09/03/14 3:30 pm [name]. [name], RN, BSN, [place] [phone]

## 2015-09-04 ENCOUNTER — Other Ambulatory Visit: Payer: Self-pay | Admitting: *Deleted

## 2015-09-04 ENCOUNTER — Ambulatory Visit: Payer: Medicare Other | Admitting: Cardiology

## 2015-09-04 NOTE — Patient Outreach (Signed)
[facility]) Care Management   09/04/2015  [name] [birth year] [id number]  [name] is an 74 y.o. female  Subjective:  "I am doing good" Patient denies pain Daughter states patient unable to get out of bed, they had to cancel the cardiology appointment because patient cannot get down steps to car.  Daughter concerned about skin breakdown on sacral area and heel.   Daughter reports [facility] discussed Hospice services with them, daughter asking RNCM more about services. Daughter requests RNCM to call Hospice and to call Primary care doctor.  Daughter states that they have already had a Hospice consult earlier in month but was not ready to go onto Hospice services.    Objective:   BP 120/62 mmHg  Pulse 68  Resp 20  SpO2 96%  Patient sitting up in bed, daughter feeding patient pureed diet and thickened liquid  Review of Systems  Constitutional: Negative.   Respiratory: Positive for cough and sputum production.   Cardiovascular: Negative.   Gastrointestinal: Negative.   Genitourinary:       Incontinence  Skin:       Daughter reports broken area on sacrum, dressing in place from [facility], unable to assess area  Neurological: Negative.     Physical Exam  Constitutional: She appears well-developed and well-nourished.  Neck: Normal range of motion.  Cardiovascular: Normal rate and regular rhythm.   Respiratory: Effort normal.  GI: Bowel sounds are normal. She exhibits distension.  Musculoskeletal: She exhibits edema.  Neurological: She is alert.  Skin: Skin is warm.  Left heel with reddened area, HHRN had applied iodine    Current Medications:   Current Outpatient Prescriptions  Medication Sig Dispense Refill  . albuterol (PROVENTIL HFA;VENTOLIN HFA) 108 (90 BASE) MCG/ACT inhaler Inhale 2 puffs into the lungs every 6 (six) hours as needed for wheezing or shortness of breath. 1 Inhaler 2  . calcitRIOL (ROCALTROL) 0.25 MCG capsule Take 1 capsule  (0.25 mcg total) by mouth daily. 31 capsule 0  . donepezil (ARICEPT) 10 MG tablet Take 10 mg by mouth at bedtime.     . folic acid (FOLVITE) 1 MG tablet Take 1 mg by mouth every morning.     . hydrALAZINE (APRESOLINE) 25 MG tablet Take 75 mg by mouth every 8 (eight) hours.     . isosorbide mononitrate (IMDUR) 30 MG 24 hr tablet Take 0.5 tablets (15 mg total) by mouth every morning. 30 tablet 0  . levETIRAcetam (KEPPRA) 100 MG/ML solution Take 500 mg by mouth 2 (two) times daily.     Marland Kitchen linagliptin (TRADJENTA) 5 MG TABS tablet Take 5 mg by mouth every morning.     . Maltodextrin-Xanthan Gum (RESOURCE THICKENUP CLEAR) POWD Take 120 g by mouth as needed. (Patient taking differently: Take 1 Can by mouth daily. )    . metoprolol succinate (TOPROL-XL) 100 MG 24 hr tablet Take 50 mg by mouth daily. Take with or immediately following a meal.    . mupirocin ointment (BACTROBAN) 2 % Apply 1 application topically 2 (two) times daily.     . pantoprazole (PROTONIX) 40 MG tablet Take 40 mg by mouth every morning.    . potassium chloride (K-DUR) 10 MEQ tablet Take 2 tablets by mouth daily.    . pravastatin (PRAVACHOL) 20 MG tablet Take 20 mg by mouth at bedtime.     . torsemide (DEMADEX) 20 MG tablet Take 1 tablet (20 mg total) by mouth daily as needed.     No  current facility-administered medications for this visit.    Functional Status:   In your present state of health, do you have any difficulty performing the following activities: 08/13/2015 07/13/2015  Hearing? N -  Vision? N -  Difficulty concentrating or making decisions? Y -  Walking or climbing stairs? Y -  Dressing or bathing? Y -  Doing errands, shopping? Y N  Preparing Food and eating ? - -  Using the Toilet? - -  In the past six months, have you accidently leaked urine? - -  Do you have problems with loss of bowel control? - -  Managing your Medications? - -  Managing your Finances? - -  Housekeeping or managing your Housekeeping? - -     Fall/Depression Screening:    Fall Risk  09/04/2015 06/21/2015 06/07/2015 03/22/2015 11/28/2014  Falls in the past year? Yes Yes Yes Yes No  Number falls in past yr: 1 - 1 1 -  Injury with Fall? No - No No -  Risk Factor Category  High Fall Risk - High Fall Risk High Fall Risk -  Risk for fall due to : History of fall(s) - History of fall(s);Impaired balance/gait;Impaired mobility Impaired mobility;History of fall(s) Impaired balance/gait;Impaired mobility   PHQ 2/9 Scores 09/04/2015 03/22/2015 10/16/2014 09/26/2014  PHQ - 2 Score 0 0 0 0    Assessment:   Skin breakdown HF Level of care  Plan:  [facility] CM Care Plan Problem One        Most Recent Value   Care Plan for Problem One  Not Active    [facility] CM Care Plan Problem Two        Most Recent Value   Care Plan for Problem Two  Not Active    [facility] CM Care Plan Problem Three        Most Recent Value   Care Plan Problem Three  Level of care needs-Hospice   [facility] CM Short Term Goal #1 (0-30 days)  Patient will be able to recieve Hospice services in the next week   [facility] CM Short Term Goal #1 Start Date  09/04/15   Interventions for Short Term Goal #1  Reviewed Hospice services with patient and family, called Hospice, Called  Primary care physician regarding referral     RNCM reviewed Hospice services with daughter and family RNCM called Hospice  RNCM called MD office RNCM will call next week and check to see if Hospice services in place. Daughter will call RNCM with any questions or concerns.  [name]. [name], RN, BSN, [facility] [phone]

## 2015-09-05 ENCOUNTER — Encounter: Payer: Self-pay | Admitting: *Deleted

## 2015-09-11 ENCOUNTER — Other Ambulatory Visit: Payer: Self-pay | Admitting: *Deleted

## 2015-09-11 NOTE — Patient Outreach (Signed)
Call to patient daughter for transition of care week #4  Daughter states she cannot talk and will call RNCM back later. Plan to await call back from daughter [name]. [name], RN, BSN, [place] [phone]

## 2015-09-14 ENCOUNTER — Encounter: Payer: Self-pay | Admitting: *Deleted

## 2015-09-14 ENCOUNTER — Other Ambulatory Visit: Payer: Self-pay | Admitting: *Deleted

## 2015-09-14 NOTE — Patient Outreach (Addendum)
[facility]) Care Management   09/14/2015  Mackena Plummer Dolman 07-21-1941 [id number]  Erynn Vaca Rosete is an 74 y.o. female  Visit with patient, daughter and patient sister Seymour Bars Subjective:  Patient denies pain Daughter states that patient has had some problems with getting real sleepy while patient eating.  Daughter states that she told Hospice that they were not ready. She worries that they would stop her medications and would not let patient go to get blood. She does understand that patient would get comfort care.  Patient is now bed bound and daughter has had to cancel patient appointments  Objective:   Patient lying in bed, pleasant demeanor. Alert  Oriented x 1. BP 120/60 mmHg  Pulse 94  Resp 18  SpO2 92% Review of Systems  Eyes: Negative.   Respiratory: Positive for cough.   Gastrointestinal:       Incontinent  Genitourinary:       Incontinent  Skin:       Wounds  Psychiatric/Behavioral: Positive for memory loss.    Physical Exam  GI: Soft. Bowel sounds are normal.  Musculoskeletal:  bedbound  Neurological: She is alert.  Oriented x1  Skin:  Sacral wound Left ankle wound    Current Medications:   Current Outpatient Prescriptions  Medication Sig Dispense Refill  . albuterol (PROVENTIL HFA;VENTOLIN HFA) 108 (90 BASE) MCG/ACT inhaler Inhale 2 puffs into the lungs every 6 (six) hours as needed for wheezing or shortness of breath. 1 Inhaler 2  . calcitRIOL (ROCALTROL) 0.25 MCG capsule Take 1 capsule (0.25 mcg total) by mouth daily. 31 capsule 0  . donepezil (ARICEPT) 10 MG tablet Take 10 mg by mouth at bedtime.     . folic acid (FOLVITE) 1 MG tablet Take 1 mg by mouth every morning.     . hydrALAZINE (APRESOLINE) 25 MG tablet Take 75 mg by mouth every 8 (eight) hours.     . isosorbide mononitrate (IMDUR) 30 MG 24 hr tablet Take 0.5 tablets (15 mg total) by mouth every morning. 30 tablet 0  . levETIRAcetam (KEPPRA) 100 MG/ML solution Take 500 mg  by mouth 2 (two) times daily.     Marland Kitchen linagliptin (TRADJENTA) 5 MG TABS tablet Take 5 mg by mouth every morning.     . Maltodextrin-Xanthan Gum (RESOURCE THICKENUP CLEAR) POWD Take 120 g by mouth as needed. (Patient taking differently: Take 1 Can by mouth daily. )    . metoprolol succinate (TOPROL-XL) 100 MG 24 hr tablet Take 50 mg by mouth daily. Take with or immediately following a meal.    . mupirocin ointment (BACTROBAN) 2 % Apply 1 application topically 2 (two) times daily.     . pantoprazole (PROTONIX) 40 MG tablet Take 40 mg by mouth every morning.    . potassium chloride (K-DUR) 10 MEQ tablet Take 2 tablets by mouth daily.    . pravastatin (PRAVACHOL) 20 MG tablet Take 20 mg by mouth at bedtime.     . torsemide (DEMADEX) 20 MG tablet Take 1 tablet (20 mg total) by mouth daily as needed.     No current facility-administered medications for this visit.       Assessment:   Bed bound-unable to go out of home for appointments Caregiver Stress Wounds HF Diabetes  Plan:  RNCM spent time with patient and daughter discussing Hospice vs Home Care RNCM called Dr. Berdine Addison office, left detailed message regarding Home care order and patient wound RNCM discussed medical transportation issues, reviewed use of  non emergency EMS transportation Daughter will follow up with Dr. Berdine Addison and will consider Hospice care. [facility] CM Care Plan Problem Three        Most Recent Value   Care Plan Problem Three  Level of care needs-Hospice   Role Documenting the Problem Three  Care Management Coordinator   Care Plan for Problem Three  Active   [facility] CM Short Term Goal #1 (0-30 days)  Patient will be able to recieve Hospice services in the next week   [facility] CM Short Term Goal #1 Start Date  09/14/15   Interventions for Short Term Goal #1  Reviewed Hospice services again with daughter. Discussed different level of support between Hospice and Home Care services     Chauncey E. [name], RN, BSN, [place] [phone]

## 2015-09-14 NOTE — Patient Outreach (Signed)
Call from patient daughter, [name]. She states they decided against Hospice. She states patient not doing well, Dr. Berdine Addison was going to order home health but they have not started yet. Requesting acute visit. Plan to visit later this am. [name]. [name], RN, BSN, [place] [phone]

## 2015-09-17 ENCOUNTER — Other Ambulatory Visit: Payer: Self-pay | Admitting: *Deleted

## 2015-09-17 NOTE — Patient Outreach (Addendum)
Call Back from patient daughter [name], she reports patient is about the same as she was on Friday. "she has good days and bad" She has had a low CBG one time, down to 59, she was able to get it back to over 100 Patient has appointment tomorrow, she can get patient into car and w/c she plans to get Firefighters to help get her down the steps.  She realizes this is a burden on patient but is not ready to go onto Hospice services as of yet.  Patient is not left alone in apartment, has round the clock supervision. They have a request for first floor apartment, none are available at this time. They have not heard from [facility].services yet.   RNCM called Dr. Cathey [facility], spoke with Judeen Hammans, she will put in referral for [place] services for wounds for [facility].  RNCM called back to [facility] to give above information.  Plan to continue [facility] CM community services.  [name]. [name], RN, BSN, [place] [phone]

## 2015-09-17 NOTE — Patient Outreach (Signed)
Call to patient daughter to follow up on [place]. Unable to reach and Unable to leave a message. Plan to follow up later  [place]. [name], RN, BSN, [place] [phone]

## 2015-09-18 ENCOUNTER — Ambulatory Visit: Payer: Medicare Other | Admitting: Cardiology

## 2015-09-20 ENCOUNTER — Ambulatory Visit: Payer: Medicare Other | Admitting: Cardiology

## 2015-09-23 ENCOUNTER — Emergency Department (HOSPITAL_COMMUNITY): Payer: Medicare Other

## 2015-09-23 ENCOUNTER — Encounter (HOSPITAL_COMMUNITY): Payer: Self-pay | Admitting: *Deleted

## 2015-09-23 ENCOUNTER — Inpatient Hospital Stay (HOSPITAL_COMMUNITY): Payer: Medicare Other

## 2015-09-23 ENCOUNTER — Other Ambulatory Visit: Payer: Self-pay

## 2015-09-23 ENCOUNTER — Inpatient Hospital Stay (HOSPITAL_COMMUNITY)
Admission: EM | Admit: 2015-09-23 | Discharge: 2015-10-25 | DRG: 871 | Disposition: E | Payer: Medicare Other | Attending: Internal Medicine | Admitting: Internal Medicine

## 2015-09-23 DIAGNOSIS — R7989 Other specified abnormal findings of blood chemistry: Secondary | ICD-10-CM

## 2015-09-23 DIAGNOSIS — G9341 Metabolic encephalopathy: Secondary | ICD-10-CM | POA: Diagnosis present

## 2015-09-23 DIAGNOSIS — L89159 Pressure ulcer of sacral region, unspecified stage: Secondary | ICD-10-CM | POA: Diagnosis not present

## 2015-09-23 DIAGNOSIS — E1165 Type 2 diabetes mellitus with hyperglycemia: Secondary | ICD-10-CM | POA: Diagnosis present

## 2015-09-23 DIAGNOSIS — G9340 Encephalopathy, unspecified: Secondary | ICD-10-CM

## 2015-09-23 DIAGNOSIS — G40909 Epilepsy, unspecified, not intractable, without status epilepticus: Secondary | ICD-10-CM | POA: Diagnosis present

## 2015-09-23 DIAGNOSIS — A4151 Sepsis due to Escherichia coli [E. coli]: Secondary | ICD-10-CM | POA: Diagnosis not present

## 2015-09-23 DIAGNOSIS — R34 Anuria and oliguria: Secondary | ICD-10-CM | POA: Diagnosis present

## 2015-09-23 DIAGNOSIS — N189 Chronic kidney disease, unspecified: Secondary | ICD-10-CM

## 2015-09-23 DIAGNOSIS — E872 Acidosis: Secondary | ICD-10-CM | POA: Diagnosis present

## 2015-09-23 DIAGNOSIS — Z7401 Bed confinement status: Secondary | ICD-10-CM | POA: Diagnosis not present

## 2015-09-23 DIAGNOSIS — I130 Hypertensive heart and chronic kidney disease with heart failure and stage 1 through stage 4 chronic kidney disease, or unspecified chronic kidney disease: Secondary | ICD-10-CM | POA: Diagnosis present

## 2015-09-23 DIAGNOSIS — D696 Thrombocytopenia, unspecified: Secondary | ICD-10-CM | POA: Diagnosis present

## 2015-09-23 DIAGNOSIS — L8990 Pressure ulcer of unspecified site, unspecified stage: Secondary | ICD-10-CM | POA: Insufficient documentation

## 2015-09-23 DIAGNOSIS — L89150 Pressure ulcer of sacral region, unstageable: Secondary | ICD-10-CM | POA: Diagnosis present

## 2015-09-23 DIAGNOSIS — R1310 Dysphagia, unspecified: Secondary | ICD-10-CM | POA: Diagnosis present

## 2015-09-23 DIAGNOSIS — N390 Urinary tract infection, site not specified: Secondary | ICD-10-CM | POA: Diagnosis present

## 2015-09-23 DIAGNOSIS — I255 Ischemic cardiomyopathy: Secondary | ICD-10-CM | POA: Diagnosis present

## 2015-09-23 DIAGNOSIS — J9611 Chronic respiratory failure with hypoxia: Secondary | ICD-10-CM | POA: Diagnosis present

## 2015-09-23 DIAGNOSIS — Z951 Presence of aortocoronary bypass graft: Secondary | ICD-10-CM | POA: Diagnosis not present

## 2015-09-23 DIAGNOSIS — Z66 Do not resuscitate: Secondary | ICD-10-CM | POA: Diagnosis present

## 2015-09-23 DIAGNOSIS — Z794 Long term (current) use of insulin: Secondary | ICD-10-CM | POA: Diagnosis not present

## 2015-09-23 DIAGNOSIS — N179 Acute kidney failure, unspecified: Secondary | ICD-10-CM | POA: Diagnosis present

## 2015-09-23 DIAGNOSIS — B9620 Unspecified Escherichia coli [E. coli] as the cause of diseases classified elsewhere: Secondary | ICD-10-CM | POA: Diagnosis present

## 2015-09-23 DIAGNOSIS — E1122 Type 2 diabetes mellitus with diabetic chronic kidney disease: Secondary | ICD-10-CM | POA: Diagnosis present

## 2015-09-23 DIAGNOSIS — I252 Old myocardial infarction: Secondary | ICD-10-CM

## 2015-09-23 DIAGNOSIS — D631 Anemia in chronic kidney disease: Secondary | ICD-10-CM | POA: Diagnosis present

## 2015-09-23 DIAGNOSIS — Z8673 Personal history of transient ischemic attack (TIA), and cerebral infarction without residual deficits: Secondary | ICD-10-CM | POA: Diagnosis not present

## 2015-09-23 DIAGNOSIS — Z823 Family history of stroke: Secondary | ICD-10-CM

## 2015-09-23 DIAGNOSIS — R32 Unspecified urinary incontinence: Secondary | ICD-10-CM | POA: Diagnosis present

## 2015-09-23 DIAGNOSIS — E875 Hyperkalemia: Secondary | ICD-10-CM | POA: Diagnosis present

## 2015-09-23 DIAGNOSIS — A419 Sepsis, unspecified organism: Secondary | ICD-10-CM | POA: Diagnosis present

## 2015-09-23 DIAGNOSIS — Z7189 Other specified counseling: Secondary | ICD-10-CM

## 2015-09-23 DIAGNOSIS — D509 Iron deficiency anemia, unspecified: Secondary | ICD-10-CM | POA: Diagnosis present

## 2015-09-23 DIAGNOSIS — I2510 Atherosclerotic heart disease of native coronary artery without angina pectoris: Secondary | ICD-10-CM | POA: Diagnosis present

## 2015-09-23 DIAGNOSIS — I5032 Chronic diastolic (congestive) heart failure: Secondary | ICD-10-CM | POA: Diagnosis present

## 2015-09-23 DIAGNOSIS — F0390 Unspecified dementia without behavioral disturbance: Secondary | ICD-10-CM | POA: Diagnosis present

## 2015-09-23 DIAGNOSIS — R509 Fever, unspecified: Secondary | ICD-10-CM

## 2015-09-23 DIAGNOSIS — R532 Functional quadriplegia: Secondary | ICD-10-CM | POA: Diagnosis present

## 2015-09-23 DIAGNOSIS — I248 Other forms of acute ischemic heart disease: Secondary | ICD-10-CM | POA: Diagnosis present

## 2015-09-23 DIAGNOSIS — E878 Other disorders of electrolyte and fluid balance, not elsewhere classified: Secondary | ICD-10-CM

## 2015-09-23 DIAGNOSIS — E782 Mixed hyperlipidemia: Secondary | ICD-10-CM | POA: Diagnosis present

## 2015-09-23 DIAGNOSIS — Z8249 Family history of ischemic heart disease and other diseases of the circulatory system: Secondary | ICD-10-CM

## 2015-09-23 DIAGNOSIS — Z515 Encounter for palliative care: Secondary | ICD-10-CM | POA: Diagnosis present

## 2015-09-23 DIAGNOSIS — N184 Chronic kidney disease, stage 4 (severe): Secondary | ICD-10-CM | POA: Diagnosis present

## 2015-09-23 DIAGNOSIS — Z87891 Personal history of nicotine dependence: Secondary | ICD-10-CM

## 2015-09-23 DIAGNOSIS — N19 Unspecified kidney failure: Secondary | ICD-10-CM | POA: Diagnosis present

## 2015-09-23 DIAGNOSIS — I10 Essential (primary) hypertension: Secondary | ICD-10-CM | POA: Diagnosis not present

## 2015-09-23 DIAGNOSIS — Z833 Family history of diabetes mellitus: Secondary | ICD-10-CM

## 2015-09-23 DIAGNOSIS — E870 Hyperosmolality and hypernatremia: Secondary | ICD-10-CM

## 2015-09-23 DIAGNOSIS — R778 Other specified abnormalities of plasma proteins: Secondary | ICD-10-CM

## 2015-09-23 HISTORY — DX: Bed confinement status: Z74.01

## 2015-09-23 LAB — CBC WITH DIFFERENTIAL/PLATELET
BASOS ABS: 0 10*3/uL (ref 0.0–0.1)
Basophils Relative: 0 %
Eosinophils Absolute: 0 10*3/uL (ref 0.0–0.7)
Eosinophils Relative: 0 %
HCT: 29.5 % — ABNORMAL LOW (ref 36.0–46.0)
Hemoglobin: 8.5 g/dL — ABNORMAL LOW (ref 12.0–15.0)
LYMPHS ABS: 0.7 10*3/uL (ref 0.7–4.0)
Lymphocytes Relative: 8 %
MCH: 25.7 pg — ABNORMAL LOW (ref 26.0–34.0)
MCHC: 28.8 g/dL — ABNORMAL LOW (ref 30.0–36.0)
MCV: 89.1 fL (ref 78.0–100.0)
MONO ABS: 0.3 10*3/uL (ref 0.1–1.0)
Monocytes Relative: 3 %
NEUTROS PCT: 89 %
Neutro Abs: 7.9 10*3/uL — ABNORMAL HIGH (ref 1.7–7.7)
PLATELETS: 123 10*3/uL — AB (ref 150–400)
RBC: 3.31 MIL/uL — AB (ref 3.87–5.11)
RDW: 20.3 % — AB (ref 11.5–15.5)
WBC: 8.9 10*3/uL (ref 4.0–10.5)

## 2015-09-23 LAB — BASIC METABOLIC PANEL
Anion gap: 9 (ref 5–15)
BUN: 111 mg/dL — ABNORMAL HIGH (ref 6–20)
CALCIUM: 7.8 mg/dL — AB (ref 8.9–10.3)
CO2: 10 mmol/L — AB (ref 22–32)
Chloride: 150 mmol/L (ref 101–111)
Creatinine, Ser: 5.43 mg/dL — ABNORMAL HIGH (ref 0.44–1.00)
GFR, EST AFRICAN AMERICAN: 8 mL/min — AB (ref >60)
GFR, EST NON AFRICAN AMERICAN: 7 mL/min — AB (ref >60)
GLUCOSE: 209 mg/dL — AB (ref 65–99)
Potassium: 8 mmol/L (ref 3.5–5.1)
Sodium: 169 mmol/L (ref 135–145)

## 2015-09-23 LAB — URINE MICROSCOPIC-ADD ON

## 2015-09-23 LAB — MRSA PCR SCREENING: MRSA BY PCR: NEGATIVE

## 2015-09-23 LAB — COMPREHENSIVE METABOLIC PANEL
ALT: 13 U/L — AB (ref 14–54)
AST: 35 U/L (ref 15–41)
Albumin: 2.1 g/dL — ABNORMAL LOW (ref 3.5–5.0)
Alkaline Phosphatase: 106 U/L (ref 38–126)
Anion gap: 9 (ref 5–15)
BUN: 97 mg/dL — AB (ref 6–20)
CHLORIDE: 151 mmol/L — AB (ref 101–111)
CO2: 11 mmol/L — AB (ref 22–32)
CREATININE: 5.59 mg/dL — AB (ref 0.44–1.00)
Calcium: 8.2 mg/dL — ABNORMAL LOW (ref 8.9–10.3)
GFR, EST AFRICAN AMERICAN: 8 mL/min — AB (ref >60)
GFR, EST NON AFRICAN AMERICAN: 7 mL/min — AB (ref >60)
Glucose, Bld: 127 mg/dL — ABNORMAL HIGH (ref 65–99)
POTASSIUM: 8.6 mmol/L — AB (ref 3.5–5.1)
SODIUM: 171 mmol/L — AB (ref 135–145)
Total Bilirubin: 0.9 mg/dL (ref 0.3–1.2)
Total Protein: 7.7 g/dL (ref 6.5–8.1)

## 2015-09-23 LAB — BRAIN NATRIURETIC PEPTIDE: B Natriuretic Peptide: 2473 pg/mL — ABNORMAL HIGH (ref 0.0–100.0)

## 2015-09-23 LAB — LACTIC ACID, PLASMA
LACTIC ACID, VENOUS: 3.2 mmol/L — AB (ref 0.5–2.0)
Lactic Acid, Venous: 2.1 mmol/L (ref 0.5–2.0)
Lactic Acid, Venous: 2.1 mmol/L (ref 0.5–2.0)
Lactic Acid, Venous: 3.4 mmol/L (ref 0.5–2.0)

## 2015-09-23 LAB — GLUCOSE, CAPILLARY
GLUCOSE-CAPILLARY: 111 mg/dL — AB (ref 65–99)
GLUCOSE-CAPILLARY: 200 mg/dL — AB (ref 65–99)
Glucose-Capillary: 127 mg/dL — ABNORMAL HIGH (ref 65–99)

## 2015-09-23 LAB — URINALYSIS, ROUTINE W REFLEX MICROSCOPIC
Bilirubin Urine: NEGATIVE
GLUCOSE, UA: NEGATIVE mg/dL
Ketones, ur: NEGATIVE mg/dL
Nitrite: NEGATIVE
PH: 5.5 (ref 5.0–8.0)
Protein, ur: 100 mg/dL — AB
Specific Gravity, Urine: 1.02 (ref 1.005–1.030)
Urobilinogen, UA: 0.2 mg/dL (ref 0.0–1.0)

## 2015-09-23 LAB — TROPONIN I
TROPONIN I: 2.4 ng/mL — AB (ref <0.031)
Troponin I: 1.33 ng/mL (ref <0.031)
Troponin I: 2.13 ng/mL (ref <0.031)

## 2015-09-23 MED ORDER — DEXTROSE 5 % IV SOLN
INTRAVENOUS | Status: DC
  Administered 2015-09-23 (×2): via INTRAVENOUS
  Administered 2015-09-24: 1000 mL via INTRAVENOUS
  Administered 2015-09-24: 07:00:00 via INTRAVENOUS

## 2015-09-23 MED ORDER — SODIUM CHLORIDE 0.9 % IV SOLN
1.0000 g | Freq: Once | INTRAVENOUS | Status: AC
  Administered 2015-09-23: 1 g via INTRAVENOUS
  Filled 2015-09-23: qty 10

## 2015-09-23 MED ORDER — INFLUENZA VAC SPLIT QUAD 0.5 ML IM SUSY
0.5000 mL | PREFILLED_SYRINGE | INTRAMUSCULAR | Status: AC
  Administered 2015-09-24: 0.5 mL via INTRAMUSCULAR
  Filled 2015-09-23: qty 0.5

## 2015-09-23 MED ORDER — DEXTROSE 50 % IV SOLN
50.0000 mL | Freq: Once | INTRAVENOUS | Status: AC
  Administered 2015-09-23: 50 mL via INTRAVENOUS

## 2015-09-23 MED ORDER — INSULIN ASPART 100 UNIT/ML ~~LOC~~ SOLN
10.0000 [IU] | Freq: Once | SUBCUTANEOUS | Status: AC
  Administered 2015-09-23: 10 [IU] via INTRAVENOUS

## 2015-09-23 MED ORDER — LEVETIRACETAM IN NACL 500 MG/100ML IV SOLN
INTRAVENOUS | Status: AC
  Filled 2015-09-23: qty 100

## 2015-09-23 MED ORDER — ACETAMINOPHEN 325 MG PO TABS: 650.0000 mg | ORAL_TABLET | Freq: Four times a day (QID) | ORAL | Status: DC | PRN

## 2015-09-23 MED ORDER — INSULIN ASPART 100 UNIT/ML ~~LOC~~ SOLN
10.0000 [IU] | Freq: Once | SUBCUTANEOUS | Status: AC
  Administered 2015-09-23: 10 [IU] via INTRAVENOUS
  Filled 2015-09-23: qty 1

## 2015-09-23 MED ORDER — HYDRALAZINE HCL 20 MG/ML IJ SOLN: 10.0000 mg | Freq: Four times a day (QID) | INTRAMUSCULAR | Status: DC | PRN

## 2015-09-23 MED ORDER — METRONIDAZOLE IN NACL 5-0.79 MG/ML-% IV SOLN
500.0000 mg | Freq: Three times a day (TID) | INTRAVENOUS | Status: DC
  Administered 2015-09-23 – 2015-09-25 (×7): 500 mg via INTRAVENOUS
  Filled 2015-09-23 (×7): qty 100

## 2015-09-23 MED ORDER — SODIUM POLYSTYRENE SULFONATE 15 GM/60ML PO SUSP
30.0000 g | Freq: Once | ORAL | Status: AC
  Administered 2015-09-23: 30 g via RECTAL
  Filled 2015-09-23: qty 120

## 2015-09-23 MED ORDER — CEFTRIAXONE SODIUM 2 G IJ SOLR
2.0000 g | INTRAMUSCULAR | Status: DC
  Administered 2015-09-23 – 2015-09-25 (×3): 2 g via INTRAVENOUS
  Filled 2015-09-23 (×4): qty 2

## 2015-09-23 MED ORDER — SODIUM CHLORIDE 0.9 % IV BOLUS (SEPSIS)
1000.0000 mL | INTRAVENOUS | Status: AC
  Administered 2015-09-23 (×2): 1000 mL via INTRAVENOUS

## 2015-09-23 MED ORDER — MORPHINE SULFATE (PF) 2 MG/ML IV SOLN: 0.5000 mg | INTRAVENOUS | Status: DC | PRN

## 2015-09-23 MED ORDER — DAKINS (1/4 STRENGTH) 0.125 % EX SOLN
Freq: Two times a day (BID) | CUTANEOUS | Status: AC
Start: 2015-09-23 — End: 2015-09-26
  Administered 2015-09-23 – 2015-09-24 (×3)
  Administered 2015-09-25: 1
  Administered 2015-09-25 – 2015-09-26 (×2)
  Filled 2015-09-23 (×2): qty 473

## 2015-09-23 MED ORDER — DEXTROSE 50 % IV SOLN
INTRAVENOUS | Status: AC
  Administered 2015-09-23: 21:00:00
  Filled 2015-09-23: qty 50

## 2015-09-23 MED ORDER — SODIUM CHLORIDE 0.9 % IV BOLUS (SEPSIS)
500.0000 mL | Freq: Once | INTRAVENOUS | Status: AC
  Administered 2015-09-23: 500 mL via INTRAVENOUS

## 2015-09-23 MED ORDER — CYCLOBENZAPRINE HCL 10 MG PO TABS: 10.0000 mg | ORAL_TABLET | Freq: Once | ORAL | Status: DC

## 2015-09-23 MED ORDER — ACETAMINOPHEN 650 MG RE SUPP
650.0000 mg | Freq: Once | RECTAL | Status: AC
Start: 2015-09-23 — End: 2015-09-23
  Administered 2015-09-23: 650 mg via RECTAL

## 2015-09-23 MED ORDER — ACETAMINOPHEN 650 MG RE SUPP
RECTAL | Status: AC
  Filled 2015-09-23: qty 1

## 2015-09-23 MED ORDER — ACETAMINOPHEN 650 MG RE SUPP: 650.0000 mg | Freq: Four times a day (QID) | RECTAL | Status: DC | PRN

## 2015-09-23 MED ORDER — SODIUM CHLORIDE 0.9 % IV SOLN
INTRAVENOUS | Status: DC
  Administered 2015-09-23: 08:00:00 via INTRAVENOUS

## 2015-09-23 MED ORDER — SODIUM CHLORIDE 0.9 % IV SOLN
500.0000 mg | Freq: Two times a day (BID) | INTRAVENOUS | Status: DC
  Administered 2015-09-23: 500 mg via INTRAVENOUS
  Filled 2015-09-23 (×4): qty 5

## 2015-09-23 MED ORDER — PANTOPRAZOLE SODIUM 40 MG IV SOLR
40.0000 mg | INTRAVENOUS | Status: DC
  Administered 2015-09-24 – 2015-09-25 (×2): 40 mg via INTRAVENOUS
  Filled 2015-09-23 (×2): qty 40

## 2015-09-23 MED ORDER — HEPARIN SODIUM (PORCINE) 5000 UNIT/ML IJ SOLN
5000.0000 [IU] | Freq: Three times a day (TID) | INTRAMUSCULAR | Status: DC
  Administered 2015-09-23 – 2015-09-24 (×3): 5000 [IU] via SUBCUTANEOUS
  Filled 2015-09-23 (×3): qty 1

## 2015-09-23 MED ORDER — SODIUM CHLORIDE 0.9 % IJ SOLN
3.0000 mL | Freq: Two times a day (BID) | INTRAMUSCULAR | Status: DC
  Administered 2015-09-23 – 2015-09-26 (×7): 3 mL via INTRAVENOUS

## 2015-09-23 MED ORDER — PNEUMOCOCCAL VAC POLYVALENT 25 MCG/0.5ML IJ INJ
0.5000 mL | INJECTION | INTRAMUSCULAR | Status: AC
  Administered 2015-09-24: 0.5 mL via INTRAMUSCULAR
  Filled 2015-09-23: qty 0.5

## 2015-09-23 MED ORDER — ALBUTEROL (5 MG/ML) CONTINUOUS INHALATION SOLN
10.0000 mg/h | INHALATION_SOLUTION | Freq: Once | RESPIRATORY_TRACT | Status: AC
  Administered 2015-09-23: 10 mg/h via RESPIRATORY_TRACT
  Filled 2015-09-23: qty 20

## 2015-09-23 MED ORDER — ASPIRIN 300 MG RE SUPP
300.0000 mg | Freq: Every day | RECTAL | Status: DC
  Administered 2015-09-24 – 2015-09-25 (×2): 300 mg via RECTAL
  Filled 2015-09-23 (×4): qty 1

## 2015-09-23 MED ORDER — DEXTROSE 50 % IV SOLN
INTRAVENOUS | Status: AC
  Administered 2015-09-23: 50 mL via INTRAVENOUS
  Filled 2015-09-23: qty 50

## 2015-09-23 NOTE — ED Notes (Signed)
Dressing applied to large sacral decubitus. Wound is extremely foul smelling. Decubitus to left buttocks as well. Large area of linear blistering to lower left leg.

## 2015-09-23 NOTE — ED Provider Notes (Signed)
CSN: [id number]     Arrival date & time 09/17/2015  0627 History   First MD Initiated Contact with Patient 08/26/2015 [phone]     Chief Complaint  Patient presents with  . Fever      Patient is a 74 y.o. female presenting with fever. The history is provided by a caregiver, a relative and the EMS personnel. The history is limited by the condition of the patient (Hx dementia).  Fever Pt was seen at 0715. Per pt's family: Pt's family called EMS today because pt "wasn't acting like she normally does." Family states pt minimally talks at baseline, and "didn't talk at all today." EMS states pt "felt hot" on their arrival to scene. Family denies fevers, no vomiting/diarrhea, no falls.      Past Medical History  Diagnosis Date  . Type 2 diabetes mellitus ([place])   . Essential hypertension, benign   . History of stroke     Previously on Coumadin  . MI, old     Reported 34  . Seizure disorder ([place])   . Coronary atherosclerosis of native coronary artery     Multivessel status post CABG 2002  . History of GI bleed     Erosive gastritis and duodenitis by EGD 2002  . Mixed hyperlipidemia   . Ischemic cardiomyopathy     LVEF 40-45% March 2013  . Dementia   . CKD (chronic kidney disease) stage 4, GFR 15-29 ml/min (HCC)   . CHF (congestive heart failure) ([place])   . Chronic respiratory failure with hypoxia ([place])   . Stroke ([place])   . Seizures ([place])   . Anemia of chronic renal failure, stage 4 (severe) ([place]) 01/02/2015  . Oxygen deficiency   . Bedridden    Past Surgical History  Procedure Laterality Date  . Coronary artery bypass graft  July 2002    LIMA to LAD, SVG to OM1, SVG to OM 2, SVG to RCA  . Tee without cardioversion  01/28/2012    Procedure: TRANSESOPHAGEAL ECHOCARDIOGRAM (TEE);  Surgeon: Lelon Perla, MD;  Location: [facility] ENDOSCOPY;  Service: Cardiovascular;  Laterality: [name];  . Colonoscopy  2002  . Esophagogastroduodenoscopy  2002  . Esophagogastroduodenoscopy [name] 01/18/2014     ESP:QZRAQ hiatal hernia. Abnormal gastric and duodenal bulbar mucosa of uncertain significance - status post gastric bx (chronic gastritis/H.pylori +  . Colonoscopy [name] 06/25/2014    Dr. Laural Golden: tubular adenoma, pandiverticulosis.   . Agile capsule [name] 07/06/2014    Procedure: AGILE CAPSULE;  Surgeon: Danie Binder, MD;  Location[facility];  Service: Endoscopy;  Laterality: [name];  730  . Esophagogastroduodenoscopy [name] 07/20/2014    Dr. Gala Romney: tiny gastric polyps not manipulated. minimal pigmentation of duodenal bulb likely not significant  . [place] capsule study [name] 07/20/2014    incomplete   Family History  Problem Relation Age of Onset  . Stroke Father   . Diabetes type II Mother   . Colon cancer Neg Hx    Social History  Substance Use Topics  . Smoking status: Former Smoker -- 1.00 packs/day for 50 years    Types: Cigarettes    Start date: 02/22/1961    Quit date: 02/23/2012  . Smokeless tobacco: Never Used  . Alcohol Use: No    Review of Systems  Unable to perform ROS: Dementia  Constitutional: Positive for fever.      Allergies  Review of patient's allergies indicates no known allergies.  Home Medications   Prior to Admission medications  Medication Sig Start Date End Date Taking? Authorizing Provider  albuterol (PROVENTIL HFA;VENTOLIN HFA) 108 (90 BASE) MCG/ACT inhaler Inhale 2 puffs into the lungs every 6 (six) hours as needed for wheezing or shortness of breath. 08/02/15   Arnoldo Lenis, MD  calcitRIOL (ROCALTROL) 0.25 MCG capsule Take 1 capsule (0.25 mcg total) by mouth daily. 05/02/14   Eugenie Filler, MD  donepezil (ARICEPT) 10 MG tablet Take 10 mg by mouth at bedtime.  01/30/14   Historical Provider, MD  folic acid (FOLVITE) 1 MG tablet Take 1 mg by mouth every morning.     Historical Provider, MD  hydrALAZINE (APRESOLINE) 25 MG tablet Take 75 mg by mouth every 8 (eight) hours.     Historical Provider, MD  insulin detemir (LEVEMIR) 100 UNIT/ML injection  Inject 6 Units into the skin at bedtime.    Historical Provider, MD  isosorbide mononitrate (IMDUR) 30 MG 24 hr tablet Take 0.5 tablets (15 mg total) by mouth every morning. 01/09/15   Radene Gunning, NP  levETIRAcetam (KEPPRA) 100 MG/ML solution Take 500 mg by mouth 2 (two) times daily.  09/29/14   Historical Provider, MD  linagliptin (TRADJENTA) 5 MG TABS tablet Take 5 mg by mouth every morning.     Historical Provider, MD  Maltodextrin-Xanthan Gum (RESOURCE THICKENUP CLEAR) POWD Take 120 g by mouth as needed. Patient taking differently: Take 1 Can by mouth daily.  01/09/15   Radene Gunning, NP  metoprolol succinate (TOPROL-XL) 100 MG 24 hr tablet Take 50 mg by mouth daily. Take with or immediately following a meal.    Historical Provider, MD  mupirocin ointment (BACTROBAN) 2 % Apply 1 application topically 2 (two) times daily.  01/03/15   Historical Provider, MD  pantoprazole (PROTONIX) 40 MG tablet Take 40 mg by mouth every morning.    Historical Provider, MD  potassium chloride (K-DUR) 10 MEQ tablet Take 2 tablets by mouth daily. 07/25/15   Historical Provider, MD  pravastatin (PRAVACHOL) 20 MG tablet Take 20 mg by mouth at bedtime.     Historical Provider, MD  torsemide (DEMADEX) 20 MG tablet Take 1 tablet (20 mg total) by mouth daily as needed. 08/20/15   Kathie Dike, MD   BP 140/77 mmHg  Pulse 93  Temp(Src) 101.3 F (38.5 C) (Rectal)  Resp 38  Wt 140 lb (63.504 kg)  SpO2 100% Physical Exam  0720: Physical examination:  Nursing notes reviewed; Vital signs and O2 SAT reviewed;  Constitutional: Thin, frail. In no acute distress; Head:  Normocephalic, atraumatic; Eyes: EOMI, PERRL, No scleral icterus; ENMT: Mouth and pharynx normal, Mucous membranes dry; Neck: Supple, Full range of motion, No lymphadenopathy; Cardiovascular: Regular rate and rhythm, No gallop; Respiratory: Breath sounds coarse & equal bilaterally, No wheezes. +moist cough. Tachypneic. No retrax or access mm use. Normal  respiratory effort/excursion; Chest: Nontender, Movement normal; Abdomen: Soft, Nontender, Nondistended, Normal bowel sounds; Genitourinary: No CVA tenderness; Extremities: Pulses normal, No tenderness, No edema, No calf edema or asymmetry. +large linear blister noted left lateral lower leg. No erythema, no ecchymosis, no open wounds.; Spine:  No midline CS, TS, LS tenderness. +large decubitus on sacrum, deeper in center, with foul odor and scant purulent drainage. No erythema.;; Neuro: Awake, alert. Eyes open spontaneously. Moans intermittently. Minimal movement of extremities on stretcher per baseline.; Skin: Color normal, Warm, Dry.    ED Course  Procedures (including critical care time) Labs Review   Imaging Review  I have personally reviewed and evaluated these images  and lab results as part of my medical decision-making.   EKG Interpretation None      MDM  MDM Reviewed: previous chart, nursing note and vitals Reviewed previous: labs and ECG Interpretation: labs, x-ray and ECG Total time providing critical care: 30-74 minutes. This excludes time spent performing separately reportable procedures and services. Consults: admitting MD     CRITICAL CARE Performed by: Alfonzo Feller Total critical care time: 35 minutes Critical care time was exclusive of separately billable procedures and treating other patients. Critical care was necessary to treat or prevent imminent or life-threatening deterioration. Critical care was time spent personally by me on the following activities: development of treatment plan with patient and/or surrogate as well as nursing, discussions with consultants, evaluation of patient's response to treatment, examination of patient, obtaining history from patient or surrogate, ordering and performing treatments and interventions, ordering and review of laboratory studies, ordering and review of radiographic studies, pulse oximetry and re-evaluation of  patient's condition.   Results for orders placed or performed during the hospital encounter of 09/16/2015  Culture, blood (routine x 2)  Result Value Ref Range   Specimen Description BLOOD LEFT FOREARM    Special Requests BOTTLES DRAWN AEROBIC ONLY [place]    Culture PENDING    Report Status PENDING   Culture, blood (routine x 2)  Result Value Ref Range   Specimen Description BLOOD LEFT HAND    Special Requests BOTTLES DRAWN AEROBIC ONLY 6CC    Culture PENDING    Report Status PENDING   Comprehensive metabolic panel  Result Value Ref Range   Sodium 171 (HH) 135 - 145 mmol/L   Potassium 8.6 (HH) 3.5 - 5.1 mmol/L   Chloride 151 (HH) 101 - 111 mmol/L   CO2 11 (L) 22 - 32 mmol/L   Glucose, Bld 127 (H) 65 - 99 mg/dL   BUN 97 (H) 6 - 20 mg/dL   Creatinine, Ser 5.59 (H) 0.44 - 1.00 mg/dL   Calcium 8.2 (L) 8.9 - 10.3 mg/dL   Total Protein 7.7 6.5 - 8.1 g/dL   Albumin 2.1 (L) 3.5 - 5.0 g/dL   AST 35 15 - 41 U/L   ALT 13 (L) 14 - 54 U/L   Alkaline Phosphatase 106 38 - 126 U/L   Total Bilirubin 0.9 0.3 - 1.2 mg/dL   GFR calc non Af Amer 7 (L) >60 mL/min   GFR calc Af Amer 8 (L) >60 mL/min   Anion gap 9 5 - 15  Urinalysis, Routine w reflex microscopic (not at [facility])  Result Value Ref Range   Color, Urine YELLOW YELLOW   APPearance CLOUDY (A) CLEAR   Specific Gravity, Urine 1.020 1.005 - 1.030   pH 5.5 5.0 - 8.0   Glucose, UA NEGATIVE NEGATIVE mg/dL   Hgb urine dipstick TRACE (A) NEGATIVE   Bilirubin Urine NEGATIVE NEGATIVE   Ketones, ur NEGATIVE NEGATIVE mg/dL   Protein, ur 100 (A) NEGATIVE mg/dL   Urobilinogen, UA 0.2 0.0 - 1.0 mg/dL   Nitrite NEGATIVE NEGATIVE   Leukocytes, UA LARGE (A) NEGATIVE  Lactic acid, plasma  Result Value Ref Range   Lactic Acid, Venous 2.1 (HH) 0.5 - 2.0 mmol/L  CBC with Differential  Result Value Ref Range   WBC 8.9 4.0 - 10.5 K/uL   RBC 3.31 (L) 3.87 - 5.11 MIL/uL   Hemoglobin 8.5 (L) 12.0 - 15.0 g/dL   HCT 29.5 (L) 36.0 - 46.0 %   MCV 89.1 78.0 -  100.0 fL  MCH 25.7 (L) 26.0 - 34.0 pg   MCHC 28.8 (L) 30.0 - 36.0 g/dL   RDW 20.3 (H) 11.5 - 15.5 %   Platelets 123 (L) 150 - 400 K/uL   Neutrophils Relative % 89 %   Lymphocytes Relative 8 %   Monocytes Relative 3 %   Eosinophils Relative 0 %   Basophils Relative 0 %   Neutro Abs 7.9 (H) 1.7 - 7.7 K/uL   Lymphs Abs 0.7 0.7 - 4.0 K/uL   Monocytes Absolute 0.3 0.1 - 1.0 K/uL   Eosinophils Absolute 0.0 0.0 - 0.7 K/uL   Basophils Absolute 0.0 0.0 - 0.1 K/uL   RBC Morphology TARGET CELLS   Troponin I  Result Value Ref Range   Troponin I 1.33 (HH) <0.031 ng/mL  Urine microscopic-add on  Result Value Ref Range   Squamous Epithelial / LPF FEW (A) RARE   WBC, UA 21-50 <3 WBC/hpf   RBC / HPF 0-2 <3 RBC/hpf   Bacteria, UA MANY (A) RARE  Brain natriuretic peptide  Result Value Ref Range   B Natriuretic Peptide 2473.0 (H) 0.0 - 100.0 pg/mL   Dg Chest Portable 1 View 09/01/2015  CLINICAL DATA:  Altered mental status and fever EXAM: PORTABLE CHEST 1 VIEW COMPARISON:  08/13/2015 FINDINGS: The heart size is enlarged. There is no pleural effusion or edema identified. No airspace consolidation. Pulmonary vascular congestion noted. IMPRESSION: 1. Cardiac enlargement and pulmonary vascular congestion. Electronically Signed   By: Kerby Moors M.D.   On: 09/21/2015 07:53    Results for ROLENA, KNUTSON (MRN [id number]) as of  09:18  Ref. Range 08/18/2015 05:27 08/19/2015 05:47 08/20/2015 05:40 09/15/2015 07:33  Sodium Latest Ref Range: 135-145 mmol/L 141 143 144 171 (HH)  Potassium Latest Ref Range: 3.5-5.1 mmol/L 4.6 4.1 4.4 8.6 (HH)  Chloride Latest Ref Range: 101-111 mmol/L 110 107 110 151 (HH)  CO2 Latest Ref Range: 22-32 mmol/L 21 (L) 27 26 11  (L)  BUN Latest Ref Range: 6-20 mg/dL 85 (H) 81 (H) 80 (H) 97 (H)  Creatinine Latest Ref Range: 0.44-1.00 mg/dL 3.30 (H) 2.92 (H) 2.79 (H) 5.59 (H)   Results for EMMACLAIRE, SWITALA (MRN [id number]) as of 09/16/2015 09:18  Ref. Range 01/02/2015  11:25 05/28/2015 11:48 07/03/2015 15:14 08/13/2015 13:28 09/05/2015 07:30  B Natriuretic Peptide Latest Ref Range: 0.0-100.0 pg/mL >4500.0 (H) 1084.0 (H) 3737.0 (H) >4500.0 (H) 2473.0 (H)   Results for ASMI, FUGERE (MRN [id number]) as of 08/28/2015 09:18  Ref. Range 07/16/2015 13:12 07/23/2015 10:54 07/31/2015 13:01 08/13/2015 13:28 08/14/2015 06:13 08/15/2015 07:29 08/16/2015 06:03 08/31/2015 07:33  Hemoglobin Latest Ref Range: 12.0-15.0 g/dL 8.6 (L) 8.3 (L) 9.1 (L) 9.7 (L) 10.0 (L) 10.0 (L) 9.8 (L) 8.5 (L)  HCT Latest Ref Range: 36.0-46.0 % 27.9 (L) 26.9 (L) 29.7 (L) 32.9 (L) 33.2 (L) 32.5 (L) 31.3 (L) 29.5 (L)  RDW Latest Ref Range: 11.5-15.5 % 17.9 (H)  17.9 (H) 20.4 (H) 20.4 (H) 20.5 (H) 20.3 (H) 20.3 (H)  Platelets Latest Ref Range: 150-400 K/uL 170  283 186 182 174 167 123 (L)    0935:  Tx for hyperkalemia: IV calcium, IV insulin, IV D50, judicious IVF. +UTI, UC and BC x2 are pending; IV rocephin given. Long d/w family at bedside regarding DNR/DNI: daughter states "only if her heart stops I don't want that stuff." Attempted clarification (ie: abnl cardiac rhythms), pt daughter continues to state "just if her heart stops don't press on her chest or put her on a respirator."  Daughter consistently refuses central line, invasive procedures. 2 peripheral IV's are in place.  Dx and testing d/w pt's family.  Questions answered.  Verb understanding, agreeable to admit. T/C to [facility] Dr. Sarajane Jews, case discussed, including:  HPI, pertinent PM/SHx, VS/PE, dx testing, ED course and treatment:  Agreeable to admit, requests to write temporary orders, obtain stepdown bed to team APAdmits.   Francine Graven, DO 09/26/15 [phone]

## 2015-09-23 NOTE — Progress Notes (Signed)
ANTIBIOTIC CONSULT NOTE - INITIAL  Pharmacy Consult for Rocephin Indication: UTI  No Known Allergies  Patient Measurements: Weight: 140 lb (63.504 kg)  Vital Signs: Temp: 101.3 F (38.5 C) (10/30 0647) Temp Source: Rectal (10/30 0647) BP: 126/60 mmHg (10/30 0800) Pulse Rate: 78 (10/30 0800) Intake/Output from previous day:   Intake/Output from this shift: Total I/O In: -  Out: 25 [Urine:25]  Labs:  Recent Labs  09/01/2015 0733  WBC 8.9  HGB 8.5*  PLT 123*  CREATININE 5.59*   Estimated Creatinine Clearance: 7.6 mL/min (by C-G formula based on Cr of 5.59). No results for input(s): VANCOTROUGH, VANCOPEAK, VANCORANDOM, GENTTROUGH, GENTPEAK, GENTRANDOM, TOBRATROUGH, TOBRAPEAK, TOBRARND, AMIKACINPEAK, AMIKACINTROU, AMIKACIN in the last 72 hours.   Microbiology: Recent Results (from the past 720 hour(s))  Culture, blood (routine x 2)     Status: None (Preliminary result)   Collection Time: 09/13/2015  7:33 AM  Result Value Ref Range Status   Specimen Description BLOOD LEFT FOREARM  Final   Special Requests BOTTLES DRAWN AEROBIC ONLY 6CC  Final   Culture PENDING  Incomplete   Report Status PENDING  Incomplete  Culture, blood (routine x 2)     Status: None (Preliminary result)   Collection Time: 09/15/2015  7:46 AM  Result Value Ref Range Status   Specimen Description BLOOD LEFT HAND  Final   Special Requests BOTTLES DRAWN AEROBIC ONLY [place]  Final   Culture PENDING  Incomplete   Report Status PENDING  Incomplete    Medical History: Past Medical History  Diagnosis Date  . Type 2 diabetes mellitus ([place])   . Essential hypertension, benign   . History of stroke     Previously on Coumadin  . MI, old     Reported 32  . Seizure disorder ([place])   . Coronary atherosclerosis of native coronary artery     Multivessel status post CABG 2002  . History of GI bleed     Erosive gastritis and duodenitis by EGD 2002  . Mixed hyperlipidemia   . Ischemic cardiomyopathy     LVEF  40-45% March 2013  . Dementia   . CKD (chronic kidney disease) stage 4, GFR 15-29 ml/min (HCC)   . CHF (congestive heart failure) ([place])   . Chronic respiratory failure with hypoxia ([place])   . Stroke ([place])   . Seizures ([place])   . Anemia of chronic renal failure, stage 4 (severe) ([place]) 01/02/2015  . Oxygen deficiency   . Bedridden     Medications:  See med hx  Assessment: 74 yo admitted with fever, AMS. Patient has UTI and possible urosepsis. In addition, has large decubitus ulcer with foul odor and scant purulent drainage(may need anaerobe coverage in addition to rocephin with metronidazole). Lactic acid 2.1 elevated. Acute on CKD, hypernatremic. Blood cultures drawn.  Goal of Therapy:  resolution of infection  Plan:  Rocephin 2gm iv q24h Monitor V/S and labs Follow up culture results  Isac Sarna, BS Vena [place], BCPS Clinical Pharmacist Pager [phone] ,9:10 AM

## 2015-09-23 NOTE — ED Notes (Signed)
CRITICAL VALUE ALERT  Critical value received:  Lactic 2.1  Date of notification:  09/10/2015  Time of notification:  0901  Critical value read back:Yes.    Nurse who received alert:  Rminter, Rn   MD notified (1st page):  Dr. Thurnell Garbe  Time of first page:  0901  MD notified (2nd page):  Time of second page:  Responding MD:  Dr. Thurnell Garbe  Time MD responded:  [phone]

## 2015-09-23 NOTE — ED Notes (Signed)
Assisted Dr Sarajane Jews with family consultation on wishes and treatment. Patient transferred to ICU at this time.

## 2015-09-23 NOTE — Progress Notes (Signed)
CRITICAL VALUE ALERT  Critical value received:  Lactic acid 2.1   Date of notification: 09/01/2015   Time of notification:  1219  Critical value read back:Yes.    Nurse who received alert:  Oneill Bais rn   MD notified (1st page):  Carlean Jews MD   Time of first page:  1046  MD notified (2nd page):  Time of second page:  Responding MD:    Time MD responded:

## 2015-09-23 NOTE — ED Notes (Signed)
Pt arrived by EMS from home. Called out as a sick call. Pt is bed ridden, bed sore to back side, per EMS pt felt hot.

## 2015-09-23 NOTE — Consult Note (Signed)
WOC wound consult note Reason for Consult: Unstageable Pressure injuries on left ischial tuberosity and sacrum, both present with foul odor consistent with necrotic tissue). Blister on left LE lateral aspect, stable eschar (Unstrageable pressure injury) on left lateral heel. Fecal management system in place for liquid brown stool.  Mild incontinence associated dermatitis perirectally. Daughters are on the premises Etiology:  Pressure and SCALE (Skin Changes Associated with Life's End) Pressure Ulcer POA: Yes Measurement: Left lateral Heel unstageable Pressure injury:  2cm round stable eschar, no exudate, depth obscured.  Left lateral LE intact, blister:  15.5cm x 4.5cm with no depth, no exudate.  left ischial tuberosity:  4cm x 7cm with 90% firmly adherent eschar.  Small opening at 3 o'clock where eschar is pulling away indicated a depth of at least 1cm and some purulent exudate. Sacral:  10cm x 12cm Unstageable Pressure injury presenting with firmly adherent eschar covering 80% of wound.  At 6 o'clock, there is a 3cm x 3cm area where the eschar is pulling away from the sides to reveal a red wound bed. Four odor from both the left IT and this sacral wound consistent with necrotic tissue. Wound bed:As described above. Drainage (amount, consistency, odor) As described above. Periwound:intact, dry Dressing procedure/placement/frequency: Today I will provide bilateral heel pressure redistribution boots for alignment of feet and for prevention of further pressure injurs.  A conservative dressing is ordered for the left alteral LE (white petrolatum).  I will advise the use of sodium hypochlorite (Dakin's) solution for 3 days for antimicrobial control and for odor neutralization in the areas of the left IT and sacrum; following those 6 applications, we will revert to NS for topical wound care.  While conservative, these interventions will provide moisture retentive environment. Turning side to side and avoiding  the supine position, keeping the HOB at or below a 30-degree angle and utilization of a therapeutic mattress with low air loss feature are in keeping with skin precautions necessary at end of life. [place] nursing team will not follow, but will remain available to this patient, the nursing and medical teams. Please re-consult if needed. Thanks, Maudie Flakes, MSN, RN, [place], Serita Grammes, Licensed conveyancer (Pager [phone])

## 2015-09-23 NOTE — Progress Notes (Signed)
CRITICAL VALUE ALERT  Critical value received:  Lactic acid 3.4, troponin 2.13  Date of notification:  09/21/2015  Time of notification: 1500  Critical value read back:yes  Nurse who received alert: E.Maribel Hadley, RN  MD notified (1st page):  Dr.Goodrich  Time of first page: 1515  MD notified (2nd page):  Time of second page:  Responding MD: Dr.goodrich  Time MD responded:  9211

## 2015-09-23 NOTE — ED Notes (Signed)
Dr Thurnell Garbe notified of Troponin.

## 2015-09-23 NOTE — H&P (Addendum)
History and Physical  [name] MLJ:[id number] DOB: 30-Sep-1941 DOA: 09/18/2015  Referring physician: Francine Graven, DO PCP: [name], MD   Chief Complaint: not responding HPI:  50 yow PMH CKD stage IV, chronic diastolic CHF, dementia and hospitalization 07/2015 with aspiration pneumonia, AKI, acute encephalopathy. Seen by PMT 9/23: at that time: and placed on DNR.   [name] presented 10/30 with h/o unresponsiveness. ED workup with multiple issues including: AKI,  fever, sepsis, UTI, severe hyperkalemia, elevated troponin.  Patient cannot offer any history. Per daughter, [name], since last hospitalization [name] has done poorly being bedbound. Oral intake has been marginal last week. Noticed 10/26 that [name] has been less alert and did not respond as normal. Also noticed a decrease in her eating and drinking. No report of changes to vision, sore throat, chest pains, n/v/d, dysuria or SOB. No fever noted at home.There is a bed sore noted prior to admission.   Per Dr. Roderic Palau on  07/2015 d/c "Patient and family met with Palliative care to discuss goals of care. It was explained to the family that with her dysphagia [name] would likely have recurrent aspiration. Feeding tube was not recommended and family is in agreement. A referral for home Hospice has been arranged as an outpatient although the family does not appear to be completely ready to accept Hospice services. [name] appears to be tolerating a modified diet at this time."   In the emergency department febrile with temp 101.3, RR 30s, stable chronic hypoxia. Normotensive.  Pertinent labs: Sodium 171, Potassium 8.6, Chloride 151, Creatinine 5.59, BUN 97, lactic acid 2.1, Hgb 8.5, Platelets 123, and WBC 8.9 EKG: Independently reviewed. SR, prolonged QT slightly worse compared to 08/13/2015. Chronic low-voltage. No peaked t-waves Imaging: CXR pulm vascular congestion. No infiltrate.  Review of Systems:  Positive for fever. Limited due to patient's  status  Negative for visual changes, sore throat, rash, new muscle aches, chest pain, SOB, dysuria, bleeding, n/v/abdominal pain.  Past Medical History  Diagnosis Date  . Type 2 diabetes mellitus ([place])   . Essential hypertension, benign   . History of stroke     Previously on Coumadin  . MI, old     Reported 55  . Seizure disorder ([place])   . Coronary atherosclerosis of native coronary artery     Multivessel status post CABG 2002  . History of GI bleed     Erosive gastritis and duodenitis by EGD 2002  . Mixed hyperlipidemia   . Ischemic cardiomyopathy     LVEF 40-45% March 2013  . Dementia   . CKD (chronic kidney disease) stage 4, GFR 15-29 ml/min (HCC)   . CHF (congestive heart failure) ([place])   . Chronic respiratory failure with hypoxia ([place])   . Stroke ([place])   . Seizures ([place])   . Anemia of chronic renal failure, stage 4 (severe) ([place]) 01/02/2015  . Oxygen deficiency   . Bedridden     Past Surgical History  Procedure Laterality Date  . Coronary artery bypass graft  July 2002    LIMA to LAD, SVG to OM1, SVG to OM 2, SVG to RCA  . Tee without cardioversion  01/28/2012    Procedure: TRANSESOPHAGEAL ECHOCARDIOGRAM (TEE);  Surgeon: Lelon Perla, MD;  Location: [facility];  Service: Cardiovascular;  Laterality: [name];  . Colonoscopy  2002  . Esophagogastroduodenoscopy  2002  . Esophagogastroduodenoscopy [name] 01/18/2014    HQR:FXJOI hiatal hernia. Abnormal gastric and duodenal bulbar mucosa of uncertain significance - status post gastric bx (  chronic gastritis/H.pylori +  . Colonoscopy [name] 06/25/2014    Dr. Laural Golden: tubular adenoma, pandiverticulosis.   . Agile capsule [name] 07/06/2014    Procedure: AGILE CAPSULE;  Surgeon: Danie Binder, MD;  Location: [facility] ENDO SUITE;  Service: Endoscopy;  Laterality: [name];  730  . Esophagogastroduodenoscopy [name] 07/20/2014    Dr. Gala Romney: tiny gastric polyps not manipulated. minimal pigmentation of duodenal bulb likely not significant  . [place] capsule  study [name] 07/20/2014    incomplete    Social History:  reports that [name] quit smoking about 3 years ago. Her smoking use included Cigarettes. [name] started smoking about 54 years ago. [name] has a 50 pack-year smoking history. [name] has never used smokeless tobacco. [name] reports that [name] does not drink alcohol or use illicit drugs. lives with daughter Total assistance  No Known Allergies  Family History  Problem Relation Age of Onset  . Stroke Father   . Diabetes type II Mother   . Colon cancer Neg Hx      Prior to Admission medications   Medication Sig Start Date End Date Taking? Authorizing Provider  albuterol (PROVENTIL HFA;VENTOLIN HFA) 108 (90 BASE) MCG/ACT inhaler Inhale 2 puffs into the lungs every 6 (six) hours as needed for wheezing or shortness of breath. 08/02/15   Arnoldo Lenis, MD  calcitRIOL (ROCALTROL) 0.25 MCG capsule Take 1 capsule (0.25 mcg total) by mouth daily. 05/02/14   Eugenie Filler, MD  donepezil (ARICEPT) 10 MG tablet Take 10 mg by mouth at bedtime.  01/30/14   Historical Provider, MD  folic acid (FOLVITE) 1 MG tablet Take 1 mg by mouth every morning.     Historical Provider, MD  hydrALAZINE (APRESOLINE) 25 MG tablet Take 75 mg by mouth every 8 (eight) hours.     Historical Provider, MD  insulin detemir (LEVEMIR) 100 UNIT/ML injection Inject 6 Units into the skin at bedtime.    Historical Provider, MD  isosorbide mononitrate (IMDUR) 30 MG 24 hr tablet Take 0.5 tablets (15 mg total) by mouth every morning. 01/09/15   Radene Gunning, NP  levETIRAcetam (KEPPRA) 100 MG/ML solution Take 500 mg by mouth 2 (two) times daily.  09/29/14   Historical Provider, MD  linagliptin (TRADJENTA) 5 MG TABS tablet Take 5 mg by mouth every morning.     Historical Provider, MD  Maltodextrin-Xanthan Gum (RESOURCE THICKENUP CLEAR) POWD Take 120 g by mouth as needed. Patient taking differently: Take 1 Can by mouth daily.  01/09/15   Radene Gunning, NP  metoprolol succinate (TOPROL-XL) 100 MG 24 hr  tablet Take 50 mg by mouth daily. Take with or immediately following a meal.    Historical Provider, MD  mupirocin ointment (BACTROBAN) 2 % Apply 1 application topically 2 (two) times daily.  01/03/15   Historical Provider, MD  pantoprazole (PROTONIX) 40 MG tablet Take 40 mg by mouth every morning.    Historical Provider, MD  potassium chloride (K-DUR) 10 MEQ tablet Take 2 tablets by mouth daily. 07/25/15   Historical Provider, MD  pravastatin (PRAVACHOL) 20 MG tablet Take 20 mg by mouth at bedtime.     Historical Provider, MD  torsemide (DEMADEX) 20 MG tablet Take 1 tablet (20 mg total) by mouth daily as needed. 08/20/15   Kathie Dike, MD   Physical Exam: Filed Vitals:   09/18/2015 0730 09/15/2015 0800 08/30/2015 0830 08/31/2015 0900  BP: 140/77 126/60 126/60 138/62  Pulse: 93 78 86 87  Temp:      TempSrc:  Resp: 38 _0 Weight:      SpO2: 100% 94% 96% 99%    General: Appears critically ill lying in bed, eyes open; does not respond to stimuli Eyes: PERRL, normal lids, irises   MVH:QIONGEX exam appears unremarkable Neck: limited, appears unremarkable  Cardiovascular: RRR, no m/r/g. No LE edema. Respiratory: CTA bilaterally, no w/r/r. Mild increased respiratory effort. Abdomen: soft, nt, nd  Skin:  Large blister approximately 6in x 1in on the left lateral aspect of lower leg. Heel wound lateral outside aspect. Large sacral decubitus as documented by nurse and EDP.  Musculoskeletal: all limbs are flaccid; no movement noted Psychiatric: Cannot access Neurologic: Cannot access  Wt Readings from Last 3 Encounters:  08/26/2015 63.504 kg (140 lb)  08/13/15 63.3 kg (139 lb 8.8 oz)  08/02/15 64.864 kg (143 lb)    Labs on Admission:  Basic Metabolic Panel:  Recent Labs Lab 08/30/2015 0733  NA 171*  K 8.6*  CL 151*  CO2 11*  GLUCOSE 127*  BUN 97*  CREATININE 5.59*  CALCIUM 8.2*    Liver Function Tests:  Recent Labs Lab 09/21/2015 0733  AST 35  ALT 13*  ALKPHOS 106    BILITOT 0.9  PROT 7.7  ALBUMIN 2.1*   CBC:  Recent Labs Lab 09/19/2015 0733  WBC 8.9  NEUTROABS 7.9*  HGB 8.5*  HCT 29.5*  MCV 89.1  PLT 123*      Radiological Exams on Admission: Dg Chest Portable 1 View  08/29/2015  CLINICAL DATA:  Altered mental status and fever EXAM: PORTABLE CHEST 1 VIEW COMPARISON:  08/13/2015 FINDINGS: The heart size is enlarged. There is no pleural effusion or edema identified. No airspace consolidation. Pulmonary vascular congestion noted. IMPRESSION: 1. Cardiac enlargement and pulmonary vascular congestion. Electronically Signed   By: Kerby Moors M.D.   On: 08/25/2015 07:53      Principal Problem:   Sepsis ([place]) Active Problems:   CKD (chronic kidney disease) stage 4, GFR 15-29 ml/min (HCC)   Iron deficiency anemia   Dementia   AKI (acute kidney injury) (HCC)   Elevated troponin   UTI (urinary tract infection)   Anemia in chronic kidney disease (CKD)   Uremia   CKD stage 4 due to type 2 diabetes mellitus ([place])   DNR (do not resuscitate) discussion   Pressure ulcer   Acute encephalopathy   Assessment/Plan 1. Sepsis secondary to suspected UTI, possible infected sacral decubitus with lactic acidosis, AKI, AMS, thrombocytopenia. qSOFA = 2. SIRS by temp, RR. 2. AKI superimposed on CKD stage IV with severe hyperkalemia, hypernatremia, non-AG metabolic acidosis. Treated with calcium gluconate, dextrose, insulin in ED. Can't swallow. Poor candidate for NGT. Doubt enema will be effective. D/c potassium supplement 3. Sacral decubiti, left leg blister present on admission 4. Elevated troponin, EKG non-acute, sig unclear, suspect related to AKI. Doubt ACS. 5. Acute encephalopathy, metabolic/toxic. 6. Chronic combined diastolic CHF grade 2 diastolic dysfunction, complicated by chronic severe MR and chronic RV dysfunction; although CXR with congestion lungs are clear and there is no evidence of volume overload 7. DM type 2, AG 9, glucose  127. 8. Anemia of CKD and iron deficiency, stable 9. Dementia with dysphagia. On dysphagia 1 with thick liquids with known aspiration risk. 10. Functional quadriplegia. 11. Chronic hypoxic respiratory failure on 2L  12. Seizure disorder   Appears critically ill, in fact appears to be dying. Doubt [name]'ll survive hospitalization.  Long discussion with 2 daughters and patient's sister, there is no HCPOA.  Both daughters agreed DNR/DNI and no dialysis.   We discussed current acute/chronic issues. We discussed options for care within above limits including fluids, abx and attempts to improve renal function and potassium vs comfort care.   We discussed grave prognosis and and the fact [name] appears to be dying and recovery is not expected. [name] understands and desires comfort care. [name] desires fluids and abx and is having difficulty coping with grave prognosis. There is some friction between the daughters. Patient's sister desires comfort care.   At this point they agree to continue fluids, abx and recheck labs in the morning.   Continue IVF and abx   Trend lactic acid  Trend troponin   Will consult PMT when available 10/31.   ADDENDUM 1800 VSS, opens eyes but otherwise not responsive 100 mL UOP since this AM despite aggressive volume Did tolerate kayexalate  Renal u/s: no obstructive uropathy Troponin 1.33 >> 2.13 Lactic acid 2.1 >> 3.4  In addition to initial issues [name] now has demand ischemia and rising lactic acid suggestive of worsening infection. Cannot give ASA PO or PR (rectal tube). Avoid heparin given thrombocytopenia.  Discussed with [name] at bedside. Repeat BMP is pending but improvement not expected given oliguria. [name] is struggling but doesn't want mother to suffer. We discussed issues as above as well as demand ischemia or possible NSTEMI. [name] understands survival is in doubt.  I will update Dr. Arnoldo Morale night coverage.  Code Status: DNR/DNI. No HD.    DVT  prophylaxis: SCDs Family Communication: Daughters [name] and Gene and sister Lareatha bedside. Discussed with Tiffany RN present to witness Disposition Plan/Anticipated LOS: Admitted to SDU, 2-3 days.  Time spent: 50 minutes  [name], MD  [facility] Pager [phone] 09/13/2015, 9:20 AM    By signing my name below, I, Rennis Harding attest that this documentation has been prepared under the direction and in the presence of [name], MD  Electronically signed: Rennis Harding  08/28/2015 10:05am   I personally performed the services described in this documentation. All medical record entries made by the scribe were at my direction. I have reviewed the chart and agree that the record reflects my personal performance and is accurate and complete. [name], MD

## 2015-09-23 NOTE — ED Notes (Signed)
CRITICAL VALUE ALERT  Critical value received:  K+, Na, Cl  Date of notification:  09/22/2015  Time of notification:  0858  Critical value read back:Yes.    Nurse who received alert:  Maxwell Caul, RN  MD notified (1st page):  Dr Thurnell Garbe  Time of first page:  [phone]

## 2015-09-24 ENCOUNTER — Other Ambulatory Visit: Payer: Self-pay | Admitting: *Deleted

## 2015-09-24 DIAGNOSIS — Z515 Encounter for palliative care: Secondary | ICD-10-CM

## 2015-09-24 DIAGNOSIS — A419 Sepsis, unspecified organism: Secondary | ICD-10-CM | POA: Diagnosis not present

## 2015-09-24 DIAGNOSIS — L89159 Pressure ulcer of sacral region, unspecified stage: Secondary | ICD-10-CM

## 2015-09-24 LAB — GLUCOSE, CAPILLARY
GLUCOSE-CAPILLARY: 181 mg/dL — AB (ref 65–99)
GLUCOSE-CAPILLARY: 209 mg/dL — AB (ref 65–99)
GLUCOSE-CAPILLARY: 213 mg/dL — AB (ref 65–99)
GLUCOSE-CAPILLARY: 227 mg/dL — AB (ref 65–99)
GLUCOSE-CAPILLARY: 271 mg/dL — AB (ref 65–99)
Glucose-Capillary: 183 mg/dL — ABNORMAL HIGH (ref 65–99)

## 2015-09-24 LAB — CBC
HEMATOCRIT: 32.8 % — AB (ref 36.0–46.0)
HEMOGLOBIN: 9.3 g/dL — AB (ref 12.0–15.0)
MCH: 25.9 pg — ABNORMAL LOW (ref 26.0–34.0)
MCHC: 28.4 g/dL — ABNORMAL LOW (ref 30.0–36.0)
MCV: 91.4 fL (ref 78.0–100.0)
Platelets: 82 10*3/uL — ABNORMAL LOW (ref 150–400)
RBC: 3.59 MIL/uL — ABNORMAL LOW (ref 3.87–5.11)
RDW: 21.4 % — AB (ref 11.5–15.5)
WBC: 7.6 10*3/uL (ref 4.0–10.5)

## 2015-09-24 LAB — BLOOD GAS, ARTERIAL
ACID-BASE DEFICIT: 17.5 mmol/L — AB (ref 0.0–2.0)
BICARBONATE: 11.6 meq/L — AB (ref 20.0–24.0)
DRAWN BY: 25788
O2 CONTENT: 2 L/min
O2 SAT: 96.5 %
pCO2 arterial: 21.5 mmHg — ABNORMAL LOW (ref 35.0–45.0)
pH, Arterial: 7.23 — ABNORMAL LOW (ref 7.350–7.450)
pO2, Arterial: 118 mmHg — ABNORMAL HIGH (ref 80.0–100.0)

## 2015-09-24 LAB — BASIC METABOLIC PANEL
Anion gap: 7 (ref 5–15)
BUN: 102 mg/dL — AB (ref 6–20)
CO2: 9 mmol/L — ABNORMAL LOW (ref 22–32)
Calcium: 7.6 mg/dL — ABNORMAL LOW (ref 8.9–10.3)
Chloride: 147 mmol/L (ref 101–111)
Creatinine, Ser: 5.74 mg/dL — ABNORMAL HIGH (ref 0.44–1.00)
GFR calc Af Amer: 8 mL/min — ABNORMAL LOW (ref >60)
GFR calc non Af Amer: 7 mL/min — ABNORMAL LOW (ref >60)
GLUCOSE: 285 mg/dL — AB (ref 65–99)
Potassium: 7.5 mmol/L (ref 3.5–5.1)
SODIUM: 163 mmol/L — AB (ref 135–145)

## 2015-09-24 LAB — TROPONIN I
TROPONIN I: 2.39 ng/mL — AB (ref <0.031)
TROPONIN I: 2.11 ng/mL — AB (ref <0.031)

## 2015-09-24 LAB — MAGNESIUM: MAGNESIUM: 2.2 mg/dL (ref 1.7–2.4)

## 2015-09-24 LAB — PHOSPHORUS: Phosphorus: 6.9 mg/dL — ABNORMAL HIGH (ref 2.5–4.6)

## 2015-09-24 MED ORDER — SODIUM POLYSTYRENE SULFONATE 15 GM/60ML PO SUSP
30.0000 g | ORAL | Status: AC
  Administered 2015-09-24 (×2): 30 g via ORAL
  Filled 2015-09-24 (×2): qty 120

## 2015-09-24 MED ORDER — FUROSEMIDE 10 MG/ML IJ SOLN
100.0000 mg | Freq: Two times a day (BID) | INTRAMUSCULAR | Status: DC
  Administered 2015-09-24 (×2): 100 mg via INTRAVENOUS
  Filled 2015-09-24 (×3): qty 10

## 2015-09-24 MED ORDER — LEVETIRACETAM IN NACL 500 MG/100ML IV SOLN
500.0000 mg | Freq: Two times a day (BID) | INTRAVENOUS | Status: DC
  Filled 2015-09-24 (×2): qty 100

## 2015-09-24 MED ORDER — LEVETIRACETAM IN NACL 500 MG/100ML IV SOLN
500.0000 mg | Freq: Two times a day (BID) | INTRAVENOUS | Status: DC
  Administered 2015-09-24 – 2015-09-25 (×3): 500 mg via INTRAVENOUS
  Filled 2015-09-24 (×5): qty 100

## 2015-09-24 MED ORDER — CHLORHEXIDINE GLUCONATE 0.12 % MT SOLN
15.0000 mL | Freq: Two times a day (BID) | OROMUCOSAL | Status: DC
  Administered 2015-09-24 – 2015-09-26 (×6): 15 mL via OROMUCOSAL
  Filled 2015-09-24 (×5): qty 15

## 2015-09-24 MED ORDER — CETYLPYRIDINIUM CHLORIDE 0.05 % MT LIQD
7.0000 mL | Freq: Two times a day (BID) | OROMUCOSAL | Status: DC
Start: 2015-09-24 — End: 2015-09-27
  Administered 2015-09-24 – 2015-09-26 (×5): 7 mL via OROMUCOSAL

## 2015-09-24 MED ORDER — SODIUM POLYSTYRENE SULFONATE 15 GM/60ML PO SUSP: 30.0000 g | Freq: Once | ORAL | Status: DC

## 2015-09-24 NOTE — Patient Outreach (Signed)
Call from patient daughter, [name], she reports patient not doing well over the weekend, she is now in the hospital. Plan to monitor for discharge and continue [facility] services in the community. [name]. [name], RN, BSN, [place] [phone]

## 2015-09-24 NOTE — Consult Note (Signed)
Consultation Note Date: 09/24/2015   Patient Name: [name]  DOB: [birth year]  MRN: [id number]  Age / Sex: 74 y.o., female   PCP: Iona Beard, MD Referring Physician: Samuella Cota, MD  Reason for Consultation: Establishing goals of care and Psychosocial/spiritual support  Palliative Care Assessment and Plan Summary of Established Goals of Care and Medical Treatment Preferences   Clinical Assessment/Narrative: [name] is resting quietly in bed with [name] at her bedside.  She also has two other females present, described only as family.  We talk about her wounds, kidney function, and infection.  [name] talks about her mother having a heart attack.  We also talk about her acid base balance being off and the possible need for treatments.  We talk about the kidney Doctor managing this, along with her high potassium and sodium.  [name] tells me that she would like to give her mother another 24 hours, but that she has discussed transitioning to comfort care with Dr. Sarajane Jews. I share that she will show [place] if she is able to turn around.  [name] tells me that her mother was staying in bed mostly prior to admit, and that she was sleeping a lot, but was talking.  She tells me about having pain in her left leg prior to this admit, and [name] would only say "ow" when she would turn her.     Contacts/Participants in Discussion: Present today: Vicky Schuler and 2 other female family members. Daughter [name] not present.  Primary Decision Maker: 2 daughters, [name] and [name] share decision making.    HCPOA: no   Code Status/Advance Care Planning:  DNR  Wishes to pursue fluids and antibiotics for 24 hours more.   Symptom Management:   Morphine 0.5 mg IV Q 4 hours PRN  Palliative Prophylaxis: kayexalate 30 G Q 4 hours for increased K  Psycho-social/Spiritual:   Support System: Lives with daughter [name].   Desire for further Chaplaincy support: Not discussed at  this time.  Prognosis: Unable to determine, likely poor prognosis of less than 2 weeks, possibly days.   Discharge Planning:  Unable to determine at this time.        Chief Complaint:  Not responding.  History of Present Illness:   65 yow PMH CKD stage IV, chronic diastolic CHF, dementia and hospitalization 07/2015 with aspiration pneumonia, AKI, acute encephalopathy. Seen by PMT 9/23: at that time: and placed on DNR.  She presented 10/30 with h/o unresponsiveness. ED workup with multiple issues including: AKI, fever, sepsis, UTI, severe hyperkalemia, elevated troponin. Patient cannot offer any history. Per daughter, [name], since last hospitalization she has done poorly being bedbound. Oral intake has been marginal last week. Noticed 10/26 that she has been less alert and did not respond as normal. Also noticed a decrease in her eating and drinking. No report of changes to vision, sore throat, chest pains, n/v/d, dysuria or SOB. No fever noted at home.There is a bed sore noted prior to admission.    Primary Diagnoses  Present on Admission:  . (Resolved) Acute renal failure (ARF) ([place]) . Sepsis ([place]) . AKI (acute kidney injury) ([place]) . Anemia in chronic kidney disease (CKD) . CKD (chronic kidney disease) stage 4, GFR 15-29 ml/min (HCC) . Dementia . CKD stage 4 due to type 2 diabetes mellitus ([place]) . Elevated troponin . Iron deficiency anemia . UTI (urinary tract infection) . Uremia  Palliative Review of Systems: [name] is unable to participate in ROS  d/t decreased LOC.  I have reviewed the medical record, interviewed the patient and family, and examined the patient. The following aspects are pertinent.  Past Medical History  Diagnosis Date  . Type 2 diabetes mellitus ([place])   . Essential hypertension, benign   . History of stroke     Previously on Coumadin  . MI, old     Reported 64  . Seizure disorder ([place])   . Coronary atherosclerosis of native coronary artery      Multivessel status post CABG 2002  . History of GI bleed     Erosive gastritis and duodenitis by EGD 2002  . Mixed hyperlipidemia   . Ischemic cardiomyopathy     LVEF 40-45% March 2013  . Dementia   . CKD (chronic kidney disease) stage 4, GFR 15-29 ml/min (HCC)   . CHF (congestive heart failure) ([place])   . Chronic respiratory failure with hypoxia ([place])   . Stroke ([place])   . Seizures ([place])   . Anemia of chronic renal failure, stage 4 (severe) ([place]) 01/02/2015  . Oxygen deficiency   . Bedridden    Social History   Social History  . Marital Status: Single    Spouse Name: [name]  . Number of Children: [name]  . Years of Education: [name]   Social History Main Topics  . Smoking status: Former Smoker -- 1.00 packs/day for 50 years    Types: Cigarettes    Start date: 02/22/1961    Quit date: 02/23/2012  . Smokeless tobacco: Never Used  . Alcohol Use: No  . Drug Use: No  . Sexual Activity: No   Other Topics Concern  . None   Social History Narrative   Family History  Problem Relation Age of Onset  . Stroke Father   . Diabetes type II Mother   . Colon cancer Neg Hx    Scheduled Meds: . antiseptic oral rinse  7 mL Mouth Rinse q12n4p  . aspirin  300 mg Rectal Daily  . cefTRIAXone (ROCEPHIN)  IV  2 g Intravenous Q24H  . chlorhexidine  15 mL Mouth Rinse BID  . furosemide  100 mg Intravenous BID  . levETIRAcetam  500 mg Intravenous Q12H  . metronidazole  500 mg Intravenous Q8H  . pantoprazole (PROTONIX) IV  40 mg Intravenous Q24H  . sodium chloride  3 mL Intravenous Q12H  . sodium hypochlorite   Irrigation BID   Continuous Infusions: . dextrose 150 mL/hr at 09/24/15 0900   PRN Meds:.acetaminophen **OR** acetaminophen, hydrALAZINE, morphine injection Medications Prior to Admission:  Prior to Admission medications   Medication Sig Start Date End Date Taking? Authorizing Provider  albuterol (PROVENTIL HFA;VENTOLIN HFA) 108 (90 BASE) MCG/ACT inhaler Inhale 2 puffs into the lungs  every 6 (six) hours as needed for wheezing or shortness of breath. 08/02/15  Yes Arnoldo Lenis, MD  calcitRIOL (ROCALTROL) 0.25 MCG capsule Take 1 capsule (0.25 mcg total) by mouth daily. 05/02/14  Yes Eugenie Filler, MD  donepezil (ARICEPT) 10 MG tablet Take 10 mg by mouth at bedtime.  01/30/14  Yes Historical Provider, MD  folic acid (FOLVITE) 1 MG tablet Take 1 mg by mouth every morning.    Yes Historical Provider, MD  hydrALAZINE (APRESOLINE) 25 MG tablet Take 75 mg by mouth every 8 (eight) hours.    Yes Historical Provider, MD  insulin detemir (LEVEMIR) 100 UNIT/ML injection Inject 6 Units into the skin at bedtime.   Yes Historical Provider, MD  isosorbide mononitrate (IMDUR) 30 MG  24 hr tablet Take 0.5 tablets (15 mg total) by mouth every morning. 01/09/15  Yes Lezlie Octave Black, NP  levETIRAcetam (KEPPRA) 100 MG/ML solution Take 500 mg by mouth 2 (two) times daily.  09/29/14  Yes Historical Provider, MD  linagliptin (TRADJENTA) 5 MG TABS tablet Take 5 mg by mouth every morning.    Yes Historical Provider, MD  Maltodextrin-Xanthan Gum (RESOURCE THICKENUP CLEAR) POWD Take 120 g by mouth as needed. Patient taking differently: Take 1 Can by mouth daily.  01/09/15  Yes Lezlie Octave Black, NP  metoprolol succinate (TOPROL-XL) 100 MG 24 hr tablet Take 50 mg by mouth daily. Take with or immediately following a meal.   Yes Historical Provider, MD  mupirocin ointment (BACTROBAN) 2 % Apply 1 application topically 2 (two) times daily.  01/03/15  Yes Historical Provider, MD  pantoprazole (PROTONIX) 40 MG tablet Take 40 mg by mouth every morning.   Yes Historical Provider, MD  potassium chloride (K-DUR) 10 MEQ tablet Take 2 tablets by mouth daily. 07/25/15  Yes Historical Provider, MD  pravastatin (PRAVACHOL) 20 MG tablet Take 20 mg by mouth at bedtime.    Yes Historical Provider, MD  torsemide (DEMADEX) 20 MG tablet Take 1 tablet (20 mg total) by mouth daily as needed. 08/20/15  Yes Kathie Dike, MD   No Known  Allergies CBC:    Component Value Date/Time   WBC 7.6 09/24/2015 0721   HGB 9.3* 09/24/2015 0721   HCT 32.8* 09/24/2015 0721   PLT 82* 09/24/2015 0721   MCV 91.4 09/24/2015 0721   NEUTROABS 7.9* 08/26/2015 0733   LYMPHSABS 0.7  0733   MONOABS 0.3 09/02/2015 0733   EOSABS 0.0 09/24/2015 0733   BASOSABS 0.0 08/26/2015 0733   Comprehensive Metabolic Panel:    Component Value Date/Time   NA 163* 09/24/2015 0734   K 7.5* 09/24/2015 0734   CL 147* 09/24/2015 0734   CO2 9* 09/24/2015 0734   BUN 102* 09/24/2015 0734   CREATININE 5.74* 09/24/2015 0734   GLUCOSE 285* 09/24/2015 0734   CALCIUM 7.6* 09/24/2015 0734   CALCIUM 8.3* 04/28/2014 0815   AST 35 08/26/2015 0733   ALT 13* 09/09/2015 0733   ALKPHOS 106 09/22/2015 0733   BILITOT 0.9 08/29/2015 0733   PROT 7.7 09/20/2015 0733   ALBUMIN 2.1* 09/18/2015 0733    Physical Exam: Vital Signs: BP 140/61 mmHg  Pulse 80  Temp(Src) 96.7 F (35.9 C) (Axillary)  Resp 23  Ht 5' (1.524 m)  Wt 63.3 kg (139 lb 8.8 oz)  BMI 27.25 kg/m2  SpO2 100% SpO2: SpO2: 100 % O2 Device: O2 Device: Nasal Cannula O2 Flow Rate: O2 Flow Rate (L/min): 4 L/min Intake/output summary:  Intake/Output Summary (Last 24 hours) at 09/24/15 1521 Last data filed at 09/24/15 0900  Gross per 24 hour  Intake   3380 ml  Output    100 ml  Net   3280 ml   LBM: Last BM Date: 09/14/2015 Baseline Weight: Weight: 63.504 kg (140 lb) Most recent weight: Weight: 63.3 kg (139 lb 8.8 oz)  Exam Findings:  Constitutional: Elderly frail, lying in bed with eyes open, makes eye contact.  Resp:  Even and non labored.  Cardio:  RRR  GI: abd soft, non tender.          Palliative Performance Scale: 20% at best.              Additional Data Reviewed: Recent Labs     09/01/2015  0733  09/16/2015  1859  09/24/15  0721  09/24/15  0734  WBC  8.9   --   7.6   --   HGB  8.5*   --   9.3*   --   PLT  123*   --   82*   --   NA  171*  169*   --   163*  BUN  97*  111*    --   102*  CREATININE  5.59*  5.43*   --   5.74*     Time In: 1300 Time Out: 1430 Time Total: 90 minutes Greater than 50%  of this time was spent counseling and coordinating care related to the above assessment and plan.  Signed by: Drue Novel, NP  Drue Novel, NP  09/24/2015, 3:21 PM  Please contact Palliative Medicine Team phone at [phone] for questions and concerns.

## 2015-09-24 NOTE — Consult Note (Signed)
Reason for Consult: Acute kidney injury superimposed on chronic Referring Physician: Dr. Toma Deiters [name] is an 74 y.o. female.  HPI: She is here patient was history of diabetes, hypertension, chronic renal failure stage IV presently brought by her family'[name] because of fever and altered mental status. When she was evaluated she was found to have acute kidney injury superimposed on chronic, hypernatremia and also hyperkalemia. Most of the information is given by her daughter was besides her. Patient has been admitted multiple times because of similar problems before.  Past Medical History  Diagnosis Date  . Type 2 diabetes mellitus ([place])   . Essential hypertension, benign   . History of stroke     Previously on Coumadin  . MI, old     Reported 33  . Seizure disorder ([place])   . Coronary atherosclerosis of native coronary artery     Multivessel status post CABG 2002  . History of GI bleed     Erosive gastritis and duodenitis by EGD 2002  . Mixed hyperlipidemia   . Ischemic cardiomyopathy     LVEF 40-45% March 2013  . Dementia   . CKD (chronic kidney disease) stage 4, GFR 15-29 ml/min (HCC)   . CHF (congestive heart failure) ([place])   . Chronic respiratory failure with hypoxia ([place])   . Stroke ([place])   . Seizures ([place])   . Anemia of chronic renal failure, stage 4 (severe) ([place]) 01/02/2015  . Oxygen deficiency   . Bedridden     Past Surgical History  Procedure Laterality Date  . Coronary artery bypass graft  July 2002    LIMA to LAD, SVG to OM1, SVG to OM 2, SVG to RCA  . Tee without cardioversion  01/28/2012    Procedure: TRANSESOPHAGEAL ECHOCARDIOGRAM (TEE);  Surgeon: Lelon Perla, MD;  Location: [facility] ENDOSCOPY;  Service: Cardiovascular;  Laterality: [name];  . Colonoscopy  2002  . Esophagogastroduodenoscopy  2002  . Esophagogastroduodenoscopy [name] 01/18/2014    QZE:SPQZR hiatal hernia. Abnormal gastric and duodenal bulbar mucosa of uncertain significance - status post gastric  bx (chronic gastritis/H.pylori +  . Colonoscopy [name] 06/25/2014    Dr. Laural Golden: tubular adenoma, pandiverticulosis.   . Agile capsule [name] 07/06/2014    Procedure: AGILE CAPSULE;  Surgeon: Danie Binder, MD;  Location: [facility] ENDO SUITE;  Service: Endoscopy;  Laterality: [name];  730  . Esophagogastroduodenoscopy [name] 07/20/2014    Dr. Gala Romney: tiny gastric polyps not manipulated. minimal pigmentation of duodenal bulb likely not significant  . Givens capsule study [name] 07/20/2014    incomplete    Family History  Problem Relation Age of Onset  . Stroke Father   . Diabetes type II Mother   . Colon cancer Neg Hx     Social History:  reports that she quit smoking about 3 years ago. Her smoking use included Cigarettes. She started smoking about 54 years ago. She has a 50 pack-year smoking history. She has never used smokeless tobacco. She reports that she does not drink alcohol or use illicit drugs.  Allergies: No Known Allergies  Medications: I have reviewed the patient'[name] current medications.  Results for orders placed or performed during the hospital encounter of 08/25/2015 (from the past 48 hour([name]))  Urinalysis, Routine w reflex microscopic (not at [facility])     Status: Abnormal   Collection Time: 09/01/2015  7:12 AM  Result Value Ref Range   Color, Urine YELLOW YELLOW   APPearance CLOUDY (A) CLEAR   Specific Gravity, Urine 1.020  1.005 - 1.030   pH 5.5 5.0 - 8.0   Glucose, UA NEGATIVE NEGATIVE mg/dL   Hgb urine dipstick TRACE (A) NEGATIVE   Bilirubin Urine NEGATIVE NEGATIVE   Ketones, ur NEGATIVE NEGATIVE mg/dL   Protein, ur 100 (A) NEGATIVE mg/dL   Urobilinogen, UA 0.2 0.0 - 1.0 mg/dL   Nitrite NEGATIVE NEGATIVE   Leukocytes, UA LARGE (A) NEGATIVE  Urine microscopic-add on     Status: Abnormal   Collection Time: 09/13/2015  7:12 AM  Result Value Ref Range   Squamous Epithelial / LPF FEW (A) RARE   WBC, UA 21-50 <3 WBC/hpf   RBC / HPF 0-2 <3 RBC/hpf   Bacteria, UA MANY (A) RARE  Brain natriuretic  peptide     Status: Abnormal   Collection Time: 09/11/2015  7:30 AM  Result Value Ref Range   B Natriuretic Peptide 2473.0 (H) 0.0 - 100.0 pg/mL  Comprehensive metabolic panel     Status: Abnormal   Collection Time:   7:33 AM  Result Value Ref Range   Sodium 171 (HH) 135 - 145 mmol/L    Comment: CRITICAL RESULT CALLED TO, READ BACK BY AND VERIFIED WITH: OSBOURNE,T AT 0857 ON 08/29/2015 BY WOODS, M    Potassium 8.6 (HH) 3.5 - 5.1 mmol/L    Comment: CRITICAL RESULT CALLED TO, READ BACK BY AND VERIFIED WITH: OSBOURNE,T AT 0857 ON 09/07/2015 BY WOODS, M    Chloride 151 (HH) 101 - 111 mmol/L    Comment: CRITICAL RESULT CALLED TO, READ BACK BY AND VERIFIED WITH: OSBOURNE, T AT 0857 ON 08/26/2015 BY WOODS,M    CO2 11 (L) 22 - 32 mmol/L   Glucose, Bld 127 (H) 65 - 99 mg/dL   BUN 97 (H) 6 - 20 mg/dL    Comment: RESULTS CONFIRMED BY MANUAL DILUTION   Creatinine, Ser 5.59 (H) 0.44 - 1.00 mg/dL   Calcium 8.2 (L) 8.9 - 10.3 mg/dL   Total Protein 7.7 6.5 - 8.1 g/dL   Albumin 2.1 (L) 3.5 - 5.0 g/dL   AST 35 15 - 41 U/L   ALT 13 (L) 14 - 54 U/L   Alkaline Phosphatase 106 38 - 126 U/L   Total Bilirubin 0.9 0.3 - 1.2 mg/dL   GFR calc non Af Amer 7 (L) >60 mL/min   GFR calc Af Amer 8 (L) >60 mL/min    Comment: (NOTE) The eGFR has been calculated using the CKD EPI equation. This calculation has not been validated in all clinical situations. eGFR'[name] persistently <60 mL/min signify possible Chronic Kidney Disease.    Anion gap 9 5 - 15  Culture, blood (routine x 2)     Status: None (Preliminary result)   Collection Time: 08/25/2015  7:33 AM  Result Value Ref Range   Specimen Description BLOOD LEFT FOREARM    Special Requests BOTTLES DRAWN AEROBIC ONLY 6CC    Culture      GRAM NEGATIVE RODS Gram Stain Report Called to,Read Back By and Verified With: NEILSON,T @ 7846 ON 09/24/15 BY WOODIE,J GS DONE @ [facility]    Report Status PENDING   Lactic acid, plasma     Status: Abnormal   Collection  Time: 09/04/2015  7:33 AM  Result Value Ref Range   Lactic Acid, Venous 2.1 (HH) 0.5 - 2.0 mmol/L    Comment: CRITICAL RESULT CALLED TO, READ BACK BY AND VERIFIED WITH: MENTER, R AT 0900 ON 08/30/2015 BY WOODS, M   CBC with Differential     Status:  Abnormal   Collection Time: 09/01/2015  7:33 AM  Result Value Ref Range   WBC 8.9 4.0 - 10.5 K/uL    Comment: WHITE COUNT CONFIRMED ON SMEAR   RBC 3.31 (L) 3.87 - 5.11 MIL/uL   Hemoglobin 8.5 (L) 12.0 - 15.0 g/dL   HCT 29.5 (L) 36.0 - 46.0 %   MCV 89.1 78.0 - 100.0 fL   MCH 25.7 (L) 26.0 - 34.0 pg   MCHC 28.8 (L) 30.0 - 36.0 g/dL   RDW 20.3 (H) 11.5 - 15.5 %   Platelets 123 (L) 150 - 400 K/uL    Comment: SPECIMEN CHECKED FOR CLOTS LARGE PLATELETS PRESENT PLATELET COUNT CONFIRMED BY SMEAR    Neutrophils Relative % 89 %   Lymphocytes Relative 8 %   Monocytes Relative 3 %   Eosinophils Relative 0 %   Basophils Relative 0 %   Neutro Abs 7.9 (H) 1.7 - 7.7 K/uL   Lymphs Abs 0.7 0.7 - 4.0 K/uL   Monocytes Absolute 0.3 0.1 - 1.0 K/uL   Eosinophils Absolute 0.0 0.0 - 0.7 K/uL   Basophils Absolute 0.0 0.0 - 0.1 K/uL   RBC Morphology TARGET CELLS     Comment: TEARDROP CELLS SCHISTOCYTES PRESENT (2-5/hpf) FEW NRBCS   Troponin I     Status: Abnormal   Collection Time: 08/25/2015  7:33 AM  Result Value Ref Range   Troponin I 1.33 (HH) <0.031 ng/mL    Comment:        POSSIBLE MYOCARDIAL ISCHEMIA. SERIAL TESTING RECOMMENDED. CRITICAL RESULT CALLED TO, READ BACK BY AND VERIFIED WITH: OSBOURNE,T AT 0925 ON 09/18/2015 BY WOODS,M   Culture, blood (routine x 2)     Status: None (Preliminary result)   Collection Time: 09/07/2015  7:46 AM  Result Value Ref Range   Specimen Description BLOOD LEFT HAND    Special Requests BOTTLES DRAWN AEROBIC ONLY 6CC    Culture      GRAM NEGATIVE RODS Gram Stain Report Called to,Read Back By and Verified With: NEILSON,T @ 5397 ON 09/24/15 BY WOODIE,J GS DONE @ [facility]    Report Status PENDING   Lactic acid, plasma      Status: Abnormal   Collection Time: 09/22/2015  9:50 AM  Result Value Ref Range   Lactic Acid, Venous 2.1 (HH) 0.5 - 2.0 mmol/L    Comment: CRITICAL RESULT CALLED TO, READ BACK BY AND VERIFIED WITH: ZAMORA, V AT 1043 ON 09/20/2015 BY WOODS, M   Glucose, capillary     Status: Abnormal   Collection Time: 09/14/2015 11:43 AM  Result Value Ref Range   Glucose-Capillary 111 (H) 65 - 99 mg/dL  MRSA PCR Screening     Status: None   Collection Time: 09/20/2015 12:49 PM  Result Value Ref Range   MRSA by PCR NEGATIVE NEGATIVE    Comment:        The GeneXpert MRSA Assay (FDA approved for NASAL specimens only), is one component of a comprehensive MRSA colonization surveillance program. It is not intended to diagnose MRSA infection nor to guide or monitor treatment for MRSA infections.   Troponin I (q 6hr x 3)     Status: Abnormal   Collection Time: 09/14/2015  1:28 PM  Result Value Ref Range   Troponin I 2.13 (HH) <0.031 ng/mL    Comment:        POSSIBLE MYOCARDIAL ISCHEMIA. SERIAL TESTING RECOMMENDED. CRITICAL RESULT CALLED TO, READ BACK BY AND VERIFIED WITH: CHAPEL,R AT 6734 ON 09/21/2015 BY WOODS, M  Lactic acid, plasma     Status: Abnormal   Collection Time: 09/15/2015  1:28 PM  Result Value Ref Range   Lactic Acid, Venous 3.4 (HH) 0.5 - 2.0 mmol/L    Comment: CRITICAL RESULT CALLED TO, READ BACK BY AND VERIFIED WITH: CHAPEL, R AT 1441 ON 09/04/2015 BY WOODS, M   Glucose, capillary     Status: Abnormal   Collection Time: 09/11/2015  4:11 PM  Result Value Ref Range   Glucose-Capillary 127 (H) 65 - 99 mg/dL  Troponin I (q 6hr x 3)     Status: Abnormal   Collection Time: 09/18/2015  6:59 PM  Result Value Ref Range   Troponin I 2.40 (HH) <0.031 ng/mL    Comment:        POSSIBLE MYOCARDIAL ISCHEMIA. SERIAL TESTING RECOMMENDED. CRITICAL VALUE NOTED.  VALUE IS CONSISTENT WITH PREVIOUSLY REPORTED AND CALLED VALUE.   Lactic acid, plasma     Status: Abnormal   Collection Time:    6:59 PM  Result Value Ref Range   Lactic Acid, Venous 3.2 (HH) 0.5 - 2.0 mmol/L    Comment: CRITICAL RESULT CALLED TO, READ BACK BY AND VERIFIED WITH:  LEE,B AT 1935 ON 09/04/2015 BY MOSLEY,J   Basic metabolic panel     Status: Abnormal   Collection Time: 09/17/2015  6:59 PM  Result Value Ref Range   Sodium 169 (HH) 135 - 145 mmol/L    Comment: CRITICAL RESULT CALLED TO, READ BACK BY AND VERIFIED WITH: LEE,B AT 1935 ON 08/25/2015 BY MOSLEY,J    Potassium 8.0 (HH) 3.5 - 5.1 mmol/L    Comment: CRITICAL RESULT CALLED TO, READ BACK BY AND VERIFIED WITH:  LEE,B AT 1935 ON 09/07/2015 BY MOSLEY,J    Chloride 150 (HH) 101 - 111 mmol/L    Comment: CRITICAL RESULT CALLED TO, READ BACK BY AND VERIFIED WITH:  LEE,B AT 1935 ON 09/21/2015 BY MOSLEY,J    CO2 10 (L) 22 - 32 mmol/L    Comment: CRITICAL RESULT CALLED TO, READ BACK BY AND VERIFIED WITH:  LEE,B AT 1935 ON 09/22/2015 BY MOSLEY,J    Glucose, Bld 209 (H) 65 - 99 mg/dL   BUN 111 (H) 6 - 20 mg/dL    Comment: RESULTS CONFIRMED BY MANUAL DILUTION   Creatinine, Ser 5.43 (H) 0.44 - 1.00 mg/dL   Calcium 7.8 (L) 8.9 - 10.3 mg/dL   GFR calc non Af Amer 7 (L) >60 mL/min   GFR calc Af Amer 8 (L) >60 mL/min    Comment: (NOTE) The eGFR has been calculated using the CKD EPI equation. This calculation has not been validated in all clinical situations. eGFR'[name] persistently <60 mL/min signify possible Chronic Kidney Disease.    Anion gap 9 5 - 15  Glucose, capillary     Status: Abnormal   Collection Time: 09/14/2015  8:10 PM  Result Value Ref Range   Glucose-Capillary 200 (H) 65 - 99 mg/dL  Glucose, capillary     Status: Abnormal   Collection Time: 09/03/2015 11:56 PM  Result Value Ref Range   Glucose-Capillary 181 (H) 65 - 99 mg/dL  Troponin I (q 6hr x 3)     Status: Abnormal   Collection Time: 09/24/15  1:05 AM  Result Value Ref Range   Troponin I 2.39 (HH) <0.031 ng/mL    Comment:        POSSIBLE MYOCARDIAL ISCHEMIA. SERIAL  TESTING RECOMMENDED. CRITICAL VALUE NOTED.  VALUE IS CONSISTENT WITH PREVIOUSLY REPORTED AND CALLED VALUE.  Glucose, capillary     Status: Abnormal   Collection Time: 09/24/15  3:59 AM  Result Value Ref Range   Glucose-Capillary 227 (H) 65 - 99 mg/dL  Glucose, capillary     Status: Abnormal   Collection Time: 09/24/15  7:24 AM  Result Value Ref Range   Glucose-Capillary 209 (H) 65 - 99 mg/dL   Comment 1 Notify RN    Comment 2 Document in Chart     US Renal  09/08/2015  CLINICAL DATA:  Acute kidney injury. EXAM: RENAL / URINARY TRACT ULTRASOUND COMPLETE COMPARISON:  None. FINDINGS: Right Kidney: Length: 11 cm. Diffusely echogenic. Small amount of perinephric fluid. No mass or hydronephrosis visualized. Left Kidney: Length: 10.1 cm. Diffusely echogenic. Stone within the inferior pole measures 1 cm. No mass or hydronephrosis visualized. Bladder: Appears normal for degree of bladder distention. Other: Small amount of ascites noted within the right upper quadrant and left lower quadrant. IMPRESSION: 1. Bilateral echogenic kidneys compatible with chronic medical renal disease. 2. No obstructive uropathy. Electronically Signed   By: Kerby Moors M.D.   On: 09/06/2015 14:01   Dg Chest Portable 1 View  09/13/2015  CLINICAL DATA:  Altered mental status and fever EXAM: PORTABLE CHEST 1 VIEW COMPARISON:  08/13/2015 FINDINGS: The heart size is enlarged. There is no pleural effusion or edema identified. No airspace consolidation. Pulmonary vascular congestion noted. IMPRESSION: 1. Cardiac enlargement and pulmonary vascular congestion. Electronically Signed   By: Kerby Moors M.D.   On: 09/04/2015 07:53    Review of Systems  Constitutional: Positive for fever and chills.  Respiratory: Negative for cough and shortness of breath.   Cardiovascular: Negative for orthopnea and PND.  Gastrointestinal: Negative for nausea and vomiting.  Psychiatric/Behavioral: Positive for memory loss.   Blood  pressure 127/59, pulse 80, temperature 96.7 F (35.9 C), temperature source Axillary, resp. rate 23, height 5' (1.524 m), weight 139 lb 8.8 oz (63.3 kg), SpO2 100 %. Physical Exam  Constitutional: No distress.  Neck: No JVD present.  Cardiovascular: Normal rate and regular rhythm.   Respiratory: No respiratory distress. She has no rales.  Musculoskeletal: She exhibits no edema.  Neurological:  Patient is very somnolent but arousable. Presently she doesn't comunicate or answer questions.    Assessment/Plan: Problem #1 acute kidney injury superimposed on chronic: Most likely from prerenal syndrome associated with her poor by mouth intake. At this moment ATN and other etiologies cannot be ruled out. Problem #2 hyperkalemia: Her potassium is high but came down to 8 yesterday. Blood work is not available from this morning. Problem #3 hypernatremia: This is because of lack of free water. This is a recurrent issue. Problem #4 low CO2: Possibly a combination of anion gap metabolic acidosis as her lactic acid level is high however at this moment none and gap metabolic acidosis cannot be ruled out. Problem #5 elevated troponin : Possibly MI. Problem #6 dementia Problem #7 chronic renal failure: Stage IV :thought to be a combination  of diabetes/hypertension/ischemic Problem #8 anemia: Her hemoglobin is below our target goal. This is secondary to chronic renal failure Plan: 1] We'll check ABG 2] will check basic metabolic panel today in the morning. 3] will start patient on Lasix 100 mg IV twice a day 4] after checking her ABG may consider starting her on sodium bicarbonate is a history secondary to non-anion gap metabolic acidosis.  Marland Kitchen  [name] 09/24/2015, 8:05 AM

## 2015-09-24 NOTE — Progress Notes (Signed)
Inpatient Diabetes Program Recommendations  AACE/ADA: New Consensus Statement on Inpatient Glycemic Control (2015)  Target Ranges:  Prepandial:   less than 140 mg/dL      Peak postprandial:   less than 180 mg/dL (1-2 hours)      Critically ill patients:  140 - 180 mg/dL  Results for [name], [name] (MRN [id number]) as of 09/24/2015 08:56  Ref. Range 09/22/2015 11:43 09/22/2015 16:11 08/30/2015 20:10 09/21/2015 23:56 09/24/2015 03:59 09/24/2015 07:24  Glucose-Capillary Latest Ref Range: 65-99 mg/dL 111 (H) 127 (H) 200 (H) 181 (H) 227 (H) 209 (H)   Review of Glycemic Control  Diabetes history: DM2 Outpatient Diabetes medications: Levemir 6 units QHS, Tradjenta 5mg  QAM Current orders for Inpatient glycemic control: None  Inpatient Diabetes Program Recommendations: Correction (SSI): Please consider ordering CBGs with Novolog sensitive correction scale Q4H.  Thanks, Barnie Alderman, RN, MSN, CDE Diabetes Coordinator Inpatient Diabetes Program [phone] (Team Pager from [place] to [place]) [phone] ([facility] office) [phone] [facility] office) [phone] [facility] office)

## 2015-09-24 NOTE — Progress Notes (Signed)
PROGRESS NOTE  [name] JKK:[id number] DOB: 1941/05/15 DOA: 09/10/2015 PCP: [name], MD  Summary: 47 yow PMH CKD stage IV, chronic diastolic CHF, dementia and hospitalization 07/2015 with aspiration pneumonia, AKI, acute encephalopathy. Seen by PMT 9/23: at that time: and placed on DNR.  She presented 10/30 with h/o unresponsiveness. ED workup with multiple issues including: AKI, fever, sepsis, UTI, severe hyperkalemia, elevated troponin. Admitted for further evaluation and monitoring.   Assessment/Plan: 1. Sepsis secondary to suspected UTI, possible infected sacral decubitus with lactic acidosis, AKI, AMS, thrombocytopenia.   2. AKI oliguric superimposed on CKD stage IV with severe hyperkalemia, hypernatremia, non-AG metabolic acidosis. Treated with calcium gluconate, dextrose, insulin in ED. Repeat blood work pending. No sig change in creatinine, modest improvement in sodium and potassium. Telemetry unremarkable. 3. Demand ischemia, EKG non acute on admission. Telemetry SR. Troponin has peaked. Not a candidate for intervention. No ASA secondary to diarrhea/rectal tube; no heparin secondary to thrombocytopenia.  4. Acute encephalopathy, metabolic/toxic. 5. Sacral decubiti, left leg blister present on admission. Wound care has consulted, appreciate recommendations.   6. 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 7. DM type 2, stable. 8. Anemia of CKD and iron deficiency, remains stable 9. Dementia with dysphagia. On dysphagia 1 with thick liquids with known aspiration risk. 10. Functional quadriplegia. 11. Chronic hypoxic respiratory failure on 2L. 12. Seizure disorder. Will continue keppra.    No clinical change, remains critically ill, survival in doubt. Discussed with nephrology, defer tx AKI, metabolic  acidosis, potassium to nephrology.   Continue IVF and abx  Check echo for prognostic purposes  Will discuss with family later today.   PMT consultation   Code Status: DNR/DNI. No HD DVT prophylaxis: Heparin Family Communication: No family bedside.  Disposition Plan: Pending.   [name], MD  [facility]  Pager [phone] If 7PM-7AM, please contact night-coverage at [email or web address], password [facility] 09/24/2015, 8:38 AM  LOS: 1 day   Consultants:  [place]  [facility]ph[facility]   Procedures:    Antibiotics:  Rocephin 10/30>>  Flagyl 10/30>>  HPI/Subjective: No new issues overnight per nursing. Remains unresponsive. Minimal UOP.  Objective: Filed Vitals:   09/24/15 0400 09/24/15 0500 09/24/15 0600 09/24/15 0801  BP: 120/71 126/62 127/59   Pulse:      Temp: 97.4 F (36.3 C)   96.7 F (35.9 C)  TempSrc: Oral   Axillary  Resp: 30 23 23    Height:      Weight:      SpO2:        Intake/Output Summary (Last 24 hours) at 09/24/15 0838 Last data filed at 09/24/15 0600  Gross per 24 hour  Intake   2830 ml  Output    100 ml  Net   2730 ml     Filed Weights   08/25/2015 0632 09/10/2015 1139  Weight: 63.504 kg (140 lb) 63.3 kg (139 lb 8.8 oz)    Exam:  not hypoxic, VSS General: Appears critically ill and is nonresponsive. Eyes: pupils appear normal, normal lids, irises & conjunctiva ENT: Limited, lips & tongue appear unremarkable  Cardiovascular: RRR, no m/r/g. 1+ pitting edema BLE. Telemetry: SR Respiratory: CTA bilaterally, no w/r/r. Normal respiratory effort. Abdomen: soft, ntnd Skin: No change Psychiatric: Not assessable  Neurologic:Not assessable, does not follow commands.   New data reviewed:  ABG 7.2/21/118  Sodium down to 164  Potassium down to 7.5  BUN down to 102  Creating up to 5.74  Repeat troponin 2.11  UOP 125  Hgb stable 9.3  Platelets down 82  Pertinent data since admission:    Pending data:  BMP  Scheduled Meds: .  antiseptic oral rinse  7 mL Mouth Rinse q12n4p  . aspirin  300 mg Rectal Daily  . cefTRIAXone (ROCEPHIN)  IV  2 g Intravenous Q24H  . chlorhexidine  15 mL Mouth Rinse BID  . furosemide  100 mg Intravenous BID  . Influenza vac split quadrivalent PF  0.5 mL Intramuscular Tomorrow-1000  . levETIRAcetam  500 mg Intravenous Q12H  . metronidazole  500 mg Intravenous Q8H  . pantoprazole (PROTONIX) IV  40 mg Intravenous Q24H  . pneumococcal 23 valent vaccine  0.5 mL Intramuscular Tomorrow-1000  . sodium chloride  3 mL Intravenous Q12H  . sodium hypochlorite   Irrigation BID   Continuous Infusions: . dextrose 150 mL/hr at 09/24/15 0721    Principal Problem:   Sepsis ([place]) Active Problems:   CKD (chronic kidney disease) stage 4, GFR 15-29 ml/min (HCC)   Iron deficiency anemia   Dementia   AKI (acute kidney injury) (HCC)   Elevated troponin   UTI (urinary tract infection)   Anemia in chronic kidney disease (CKD)   Uremia   CKD stage 4 due to type 2 diabetes mellitus ([place])   DNR (do not resuscitate) discussion   Pressure ulcer   Acute encephalopathy   Time spent 35 minutes   By signing my name below, I, Rennis Harding attest that this documentation has been prepared under the direction and in the presence of [name], MD Electronically signed: Rennis Harding 09/24/2015 8:31am  I personally performed the services described in this documentation. All medical record entries made by the scribe were at my direction. I have reviewed the chart and agree that the record reflects my personal performance and is accurate and complete. [name], MD

## 2015-09-25 ENCOUNTER — Inpatient Hospital Stay (HOSPITAL_COMMUNITY): Payer: Medicare Other

## 2015-09-25 DIAGNOSIS — I10 Essential (primary) hypertension: Secondary | ICD-10-CM

## 2015-09-25 DIAGNOSIS — R7989 Other specified abnormal findings of blood chemistry: Secondary | ICD-10-CM

## 2015-09-25 LAB — URINE CULTURE: Culture: >=100000

## 2015-09-25 LAB — GLUCOSE, CAPILLARY
GLUCOSE-CAPILLARY: 225 mg/dL — AB (ref 65–99)
GLUCOSE-CAPILLARY: 141 mg/dL — AB (ref 65–99)
Glucose-Capillary: 184 mg/dL — ABNORMAL HIGH (ref 65–99)
Glucose-Capillary: 269 mg/dL — ABNORMAL HIGH (ref 65–99)

## 2015-09-25 LAB — BASIC METABOLIC PANEL
BUN: 108 mg/dL — ABNORMAL HIGH (ref 6–20)
CO2: 9 mmol/L — AB (ref 22–32)
Calcium: 7.6 mg/dL — ABNORMAL LOW (ref 8.9–10.3)
Chloride: >130 mmol/L (ref 101–111)
Creatinine, Ser: 5.41 mg/dL — ABNORMAL HIGH (ref 0.44–1.00)
GFR calc non Af Amer: 7 mL/min — ABNORMAL LOW (ref >60)
GFR, EST AFRICAN AMERICAN: 8 mL/min — AB (ref >60)
GLUCOSE: 274 mg/dL — AB (ref 65–99)
POTASSIUM: 6.4 mmol/L — AB (ref 3.5–5.1)
Sodium: 158 mmol/L — ABNORMAL HIGH (ref 135–145)

## 2015-09-25 MED ORDER — INSULIN ASPART 100 UNIT/ML ~~LOC~~ SOLN
0.0000 [IU] | SUBCUTANEOUS | Status: DC
  Administered 2015-09-25: 5 [IU] via SUBCUTANEOUS
  Administered 2015-09-25: 1 [IU] via SUBCUTANEOUS
  Administered 2015-09-25: 3 [IU] via SUBCUTANEOUS
  Administered 2015-09-25: 2 [IU] via SUBCUTANEOUS

## 2015-09-25 MED ORDER — LORAZEPAM 0.5 MG PO TABS: 1.0000 mg | ORAL_TABLET | ORAL | Status: DC | PRN

## 2015-09-25 MED ORDER — ATROPINE SULFATE 1 % OP SOLN
4.0000 [drp] | OPHTHALMIC | Status: DC | PRN
  Filled 2015-09-25: qty 2

## 2015-09-25 MED ORDER — MORPHINE SULFATE (PF) 2 MG/ML IV SOLN
INTRAVENOUS | Status: AC
  Filled 2015-09-25: qty 1

## 2015-09-25 MED ORDER — FUROSEMIDE 10 MG/ML IJ SOLN
160.0000 mg | Freq: Two times a day (BID) | INTRAVENOUS | Status: DC
  Administered 2015-09-25: 160 mg via INTRAVENOUS
  Filled 2015-09-25 (×3): qty 16

## 2015-09-25 MED ORDER — POLYVINYL ALCOHOL 1.4 % OP SOLN
1.0000 [drp] | Freq: Four times a day (QID) | OPHTHALMIC | Status: DC | PRN
  Filled 2015-09-25: qty 15

## 2015-09-25 MED ORDER — MORPHINE SULFATE (CONCENTRATE) 10 MG/0.5ML PO SOLN: 5.0000 mg | ORAL | Status: DC | PRN

## 2015-09-25 MED ORDER — LORAZEPAM 1 MG PO TABS: 1.0000 mg | ORAL_TABLET | ORAL | Status: DC | PRN

## 2015-09-25 MED ORDER — LORAZEPAM 2 MG/ML IJ SOLN: 1.0000 mg | INTRAMUSCULAR | Status: DC | PRN

## 2015-09-25 MED ORDER — SODIUM POLYSTYRENE SULFONATE 15 GM/60ML PO SUSP
30.0000 g | ORAL | Status: AC
  Administered 2015-09-25 (×2): 30 g via ORAL
  Filled 2015-09-25 (×2): qty 120

## 2015-09-25 MED ORDER — STERILE WATER FOR INJECTION IV SOLN
INTRAVENOUS | Status: DC
  Administered 2015-09-25: 09:00:00 via INTRAVENOUS
  Filled 2015-09-25 (×4): qty 9.7

## 2015-09-25 MED ORDER — MORPHINE SULFATE (PF) 2 MG/ML IV SOLN
1.0000 mg | INTRAVENOUS | Status: DC | PRN
  Administered 2015-09-25 – 2015-09-26 (×3): 1 mg via INTRAVENOUS
  Filled 2015-09-25 (×2): qty 1

## 2015-09-25 NOTE — Progress Notes (Signed)
PROGRESS NOTE  [name] AST:[id number] DOB: 19-Apr-1941 DOA: 09/24/2015 PCP: [name], MD  Summary: 48 yow PMH CKD stage IV, chronic diastolic CHF, dementia and hospitalization 07/2015 with aspiration pneumonia, AKI, acute encephalopathy. Seen by PMT 9/23: at that time: and placed on DNR.  She presented 10/30 with h/o unresponsiveness. ED workup with multiple issues including: AKI, fever, sepsis, UTI, severe hyperkalemia, elevated troponin. Admitted for further evaluation and monitoring.   Assessment/Plan: 1. AKI oliguric superimposed on CKD stage IV with severe hyperkalemia, hypernatremia, non-AG metabolic acidosis.  Modest  improvement in sodium and potassium but minimal UOP and increasing anasarca. 3. Demand ischemia, EKG non acute on admission. Telemetry SR. Troponin has peaked. Not a candidate for intervention. No ASA secondary to diarrhea/rectal tube; no heparin secondary to thrombocytopenia. Medical management as tolerated. Favor conservative measures. 4. Acute encephalopathy, metabolic/toxic. Unchanged  5. Sepsis secondary to E. coli UTI with lactic acidosis, AKI, AMS, thrombocytopenia. Appears resolved.  6. Sacral decubiti, left leg blister present on admission. Wound care per WOC recommendations. Doubt infection.  7. 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 Appears stable.  8. DM type 2, stable. 9. Anemia of CKD and iron deficiency, Hgb remains near baseline.  10. Dementia with dysphagia. On dysphagia 1 with thick liquids with known aspiration risk. 11. Functional quadriplegia. 12. Chronic hypoxic respiratory failure on 2L. 13. Seizure disorder. Will continue keppra.    Remains critically ill with no significant improvement. Remains encephalopathic with severe AKI and worsening anasarca and no improvement in mental status. UTI/sepsis appears resolved and troponins noncontributory to  condition.  Will discuss with family and strongly recommend transition to comfort care.  In the meantime continue abx and fluids per nephrology.   Not expected to survive this hospitalization   Code Status: DNR/DNI. No HD DVT prophylaxis: Heparin Family Communication: No family bedside.  Disposition Plan: Pending.   [name], MD  [facility]  Pager [phone] If 7PM-7AM, please contact night-coverage at [email or web address], password [facility] 09/25/2015, 6:47 AM  LOS: 2 days   Consultants:  [facility]   Procedures:    Antibiotics:  Rocephin 10/30>>  Flagyl 10/30>>  HPI/Subjective: Unresponsive, no family at bedside.   Objective: Filed Vitals:   09/25/15 0300 09/25/15 0400 09/25/15 0500 09/25/15 0600  BP: 120/53 119/52 123/53 121/57  Pulse: 81  85 82  Temp:  96.5 F (35.8 C)    TempSrc:  Axillary    Resp: 22 25 24 24   Height:      Weight:   70.9 kg (156 lb 4.9 oz)   SpO2: 100%  100% 100%    Intake/Output Summary (Last 24 hours) at 09/25/15 0647 Last data filed at 09/25/15 0600  Gross per 24 hour  Intake   4300 ml  Output    850 ml  Net   3450 ml     Filed Weights   09/04/2015 0632 09/02/2015 1139 09/25/15 0500  Weight: 63.504 kg (140 lb) 63.3 kg (139 lb 8.8 oz) 70.9 kg (156 lb 4.9 oz)    Exam:  not hypoxic, VSS General: Appears critically ill. Opens eyes to voice but does not respond.  Eyes: pupils appear normal, normal lids, irises & conjunctiva ENT: Limited, lips & tongue appear unremarkable. Drooling.  Cardiovascular: RRR, no m/r/g. 2+ BLE up to abdomen; 1+ BUE. Telemetry: SR Respiratory: CTA bilaterally, no w/r/r. Normal respiratory effort. Abdomen: soft, mild distention, non tender, foley catheter in place.  Musculoskeletal: Poor muscle tone.  Skin: No change Psychiatric: Not assessable  Neurologic:Not assessable, does not follow commands.   New data reviewed:  Sodium down to 158  Potassium down to 6.4  BUN and Creatinine  without significant change  UOP 825  Scheduled Meds: . antiseptic oral rinse  7 mL Mouth Rinse q12n4p  . aspirin  300 mg Rectal Daily  . cefTRIAXone (ROCEPHIN)  IV  2 g Intravenous Q24H  . chlorhexidine  15 mL Mouth Rinse BID  . furosemide  100 mg Intravenous BID  . insulin aspart  0-9 Units Subcutaneous 6 times per day  . levETIRAcetam  500 mg Intravenous Q12H  . metronidazole  500 mg Intravenous Q8H  . pantoprazole (PROTONIX) IV  40 mg Intravenous Q24H  . sodium chloride  3 mL Intravenous Q12H  . sodium hypochlorite   Irrigation BID   Continuous Infusions: . 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 All medical record entries made by the scribe were at my direction. I have reviewed the chart and agree that the record reflects my personal performance and is accurate and complete. [name], MD

## 2015-09-25 NOTE — Evaluation (Signed)
Discussed with [name]. Agree with comfort care.  I discussed with [name] and [name], as well as patient's sister at bedside. [name] desires comfort care and discontinuation of curative efforts including abx and fluids. She, [name] and family accept terminal prognosis and desire dignity and comfort. Support offered to [place] and family.  [name], MD [facility] [phone]

## 2015-09-25 NOTE — Progress Notes (Addendum)
Inpatient Diabetes Program Recommendations  AACE/ADA: New Consensus Statement on Inpatient Glycemic Control (2015)  Target Ranges:  Prepandial:   less than 140 mg/dL      Peak postprandial:   less than 180 mg/dL (1-2 hours)      Critically ill patients:  140 - 180 mg/dL   Review of Glycemic Control  Diabetes history: DM2 Outpatient Diabetes medications: Levemir 6 units QHS, Tradjenta 5mg  QAM Current orders for Inpatient glycemic control: Novolog 0-9 units Q4H  Inpatient Diabetes Program Recommendations: Insulin - Basal: May want to consider ordering Levemir 5 units Q24H.  Thanks, Barnie Alderman, RN, MSN, CDE Diabetes Coordinator Inpatient Diabetes Program [phone] (Team Pager from [place] to [place]) [phone] ([facility] office) [phone] [facility] office) [phone] [facility] office)

## 2015-09-25 NOTE — Care Management Note (Signed)
Case Management Note  Patient Details  Name: [name] MRN: [id number] Date of Birth: 22-Nov-1941  Subjective/Objective:                  Admitted with sepsis. Pt is from home with daughter. Pt is active with [facility] and [facility] for RN services. Palliative consult at been made.   Action/Plan: Pt has been made comfort care and morphine gtt will be initiated. In-hospital dealth anticipated, if pt does not expire tonight hospice facility or in-home care will be discussed. Cont to follow.   Expected Discharge Date:      09/26/2015            Expected Discharge Plan:  [place]  In-House Referral:  Hospice / Palliative Care  Discharge planning Services  CM Consult  Post Acute Care Choice:  NA Choice offered to:  NA  DME Arranged:    DME Agency:     HH Arranged:    HH Agency:     Status of Service:  In process, will continue to follow  Medicare Important Message Given:    Date Medicare IM Given:    Medicare IM give by:    Date Additional Medicare IM Given:    Additional Medicare Important Message give by:     If discussed at [place] of Stay Meetings, dates discussed:    Additional Comments:  Sherald Barge, RN 09/25/2015, 3:49 PM

## 2015-09-25 NOTE — Progress Notes (Signed)
Subjective: No complaints   Objective: Vital signs in last 24 hours: Temp:  [96.5 F (35.8 C)-97.7 F (36.5 C)] 96.5 F (35.8 C) (11/01 0400) Pulse Rate:  [76-92] 82 (11/01 0600) Resp:  [18-30] 24 (11/01 0600) BP: (112-149)/(47-109) 121/57 mmHg (11/01 0600) SpO2:  [99 %-100 %] 100 % (11/01 0600) Weight:  [156 lb 4.9 oz (70.9 kg)] 156 lb 4.9 oz (70.9 kg) (11/01 0500)  Intake/Output from previous day: 10/31 0701 - 11/01 0700 In: 4150 [I.V.:3450; IV Piggyback:700] Out: 850 [Urine:850] Intake/Output this shift:     Recent Labs  09/07/2015 0733 09/24/15 0721  HGB 8.5* 9.3*    Recent Labs  09/22/2015 0733 09/24/15 0721  WBC 8.9 7.6  RBC 3.31* 3.59*  HCT 29.5* 32.8*  PLT 123* 82*    Recent Labs  09/24/15 0734 09/25/15 0446  NA 163* 158*  K 7.5* 6.4*  CL 147* >130*  CO2 9* 9*  BUN 102* 108*  CREATININE 5.74* 5.41*  GLUCOSE 285* 274*  CALCIUM 7.6* 7.6*   No results for input(s): LABPT, INR in the last 72 hours.  Generally patient remained somnolent and unresponsive. Chest she has inspiratory crackles Heart exam revealed regular rate and rhythm Extremities: She has 1+ edema  Assessment/Plan: Problem #1 acute kidney injury superimposed on chronic. Her creatinine is improving slightly. Patient is nonoliguric. This is thought to be secondary to prerenal syndrome versus ATN. Problem #2 hyperkalemia: Potassium 6.4 improving Problem #3 chronic renal failure stage IV Problem #4 hypernatremia: Sodium is 158 progressively improving. Problem #5 metabolic acidosis Problem #6 anemia: Her hemoglobin is low but better Problem #7 history of dementia: Presently patient is very somnolent and unresponsive. It is over sodium is improving no sign of improvement. Patient prognosis is poor. Plan: 1]We'll change her IV fluid to 1/4 normal saline with 50 meq of sodium bicarbonate at 125 mL per hour 2] will increase Lasix to 160 mg IV twice a day 3] we'll check her basic metabolic  panel in the morning 4] we give her another Kayexalate 30 g 2 doses   Lemoine Goyne S 09/25/2015, 7:17 AM

## 2015-09-25 NOTE — Consult Note (Signed)
   [facility] CM Inpatient Consult   09/25/2015  [address] 03/06/1941 [id number]   Patient is currently active with [place] [facility] for chronic disease management services.  Patient has been engaged by a [employer] and LCSW.  Our community based plan of care has focused on disease management and community resource support.  Patient will receive a post discharge transition of care call and will be evaluated for monthly home visits for assessments and disease process education.  Made Inpatient Case Manager aware that [place] Management following. Of note, [facility] Care Management services does not replace or interfere with any services that are arranged by inpatient case management or social work.   For additional questions or referrals please contact:  [name]. [name], RN, BSN, [place] [phone]

## 2015-09-25 NOTE — Progress Notes (Signed)
PT IS NOW COMFORT CARE.

## 2015-09-25 NOTE — Progress Notes (Signed)
eLink Physician-Brief Progress Note Patient Name: [name] DOB: 12/11/1940 MRN: [id number]   Date of Service  09/25/2015  HPI/Events of Note  Blood glucose = 268. Patient is DNR/DNI.   eICU Interventions  Will order: 1. Continue Q 4 hour blood glucose checks. Will start sensitive Novolog SSI.     Intervention Category Intermediate Interventions: Hyperglycemia - evaluation and treatment  Sommer,Steven Eugene 09/25/2015, 1:06 AM

## 2015-09-25 NOTE — Progress Notes (Signed)
Daily Progress Note   Patient Name: [name]       Date: 09/25/2015 DOB: 1941-10-01  Age: 74 y.o. MRN#: [id number] Attending Physician: Samuella Cota, MD Primary Care Physician: [name], MD Admit Date: 09/07/2015  Reason for Consultation/Follow-up: Establishing goals of care, Psychosocial/spiritual support and Terminal care  Subjective: [name] is resting in bed with her eyes open. She appears to be uncomfortable.  Meeting this am with [name] niece [name] at bedside.  We talk about the seriousness of her illness and her poor prognosis.  [name] tells me that the family feels that [name] is suffering and they are trying to support [name] to help her understand this too.  [name] states she shared with [name] that her mother has been ill a long time, and that she is "expecting a miracle".    Meeting later in afternoon with family at bedside.  Daughters [name] and [name], along with [name]'s sister and niece [name].  IV team is at bedside for IV placement.  I share that Mrs. Beer's breathing has worsened and that she seems uncomfortable.  [name] quickly agrees, stating that we had talked about having another day to see if she would be able to improve.  I share that fluids are causing swelling and we are still trying to balance her acid/base.  [name] again tells me that she wants to focus on comfort.  I ask about stopping IV fluids and meds and focusing on pain/anxiety relief measures and [name] agrees.  [name] states that she is going home and leaves.   I ask the children if they want to be here for her mothers passing, [name] does and [name] does not.     Length of Stay: 2 days  Current Medications: Scheduled Meds:  . antiseptic oral rinse  7 mL Mouth Rinse q12n4p  . chlorhexidine  15 mL Mouth Rinse BID  . [COMPLETED] morphine      . sodium chloride  3 mL Intravenous Q12H  . sodium hypochlorite   Irrigation BID    Continuous Infusions:    PRN  Meds: [DISCONTINUED] acetaminophen **OR** acetaminophen, atropine, LORazepam **OR** LORazepam **OR** LORazepam, morphine injection, morphine CONCENTRATE **OR** morphine CONCENTRATE, polyvinyl alcohol  Palliative Performance Scale: 10% at this time.      Vital Signs: BP 113/64 mmHg  Pulse 85  Temp(Src) 96.4 F (35.8 C) (Axillary)  Resp 25  Ht 5' (1.524 m)  Wt 70.9 kg (156 lb 4.9 oz)  BMI 30.53 kg/m2  SpO2 100% SpO2: SpO2: 100 % O2 Device: O2 Device: Nasal Cannula O2 Flow Rate: O2 Flow Rate (L/min): 3 L/min  Intake/output summary:  Intake/Output Summary (Last 24 hours) at 09/25/15 1558 Last data filed at 09/25/15 1500  Gross per 24 hour  Intake   3550 ml  Output    850 ml  Net   2700 ml   LBM:   Baseline Weight: Weight: 63.504 kg (140 lb) Most recent weight: Weight: 70.9 kg (156 lb 4.9 oz)        Additional Data Reviewed: Recent Labs     09/05/2015  0733   09/24/15  0721  09/24/15  0734  09/25/15  0446  WBC  8.9   --   7.6   --    --   HGB  8.5*   --   9.3*   --    --   PLT  123*   --   82*   --    --  NA  171*   < >   --   163*  158*  BUN  97*   < >   --   102*  108*  CREATININE  5.59*   < >   --   5.74*  5.41*   < > = values in this interval not displayed.     Problem List:  Patient Active Problem List   Diagnosis Date Noted  . Sacral decubitus ulcer   . Pressure ulcer 09/15/2015  . Sepsis ([place]) 09/01/2015  . Acute encephalopathy 08/28/2015  . Dysphagia, pharyngoesophageal phase 08/19/2015  . Palliative care encounter   . DNR (do not resuscitate) discussion   . Aspiration pneumonia ([place]) 08/13/2015  . SOB (shortness of breath)   . Uremia 05/28/2015  . CKD stage 4 due to type 2 diabetes mellitus ([place]) 05/28/2015  . Normocytic anemia 05/28/2015  . Protein-calorie malnutrition, severe ([place]) 04/28/2015  . Heme positive stool   . History of CVA (cerebrovascular accident) 04/25/2015  . Generalized weakness 04/25/2015  . Protein calorie malnutrition ([place])  04/25/2015  . Type I diabetes mellitus, well controlled ([place]) 04/25/2015  . Essential hypertension 04/25/2015  . Chronic combined systolic and diastolic CHF (congestive heart failure) ([place]) 04/25/2015  . GI bleed 04/25/2015  . Anemia in chronic kidney disease (CKD) 04/25/2015  . Acute on chronic renal failure ([place])   . Blood loss anemia   . UTI (urinary tract infection) 01/04/2015  . Anemia of chronic renal failure, stage 4 (severe) ([place]) 01/02/2015  . Acute on chronic respiratory failure with hypoxia ([place]) 01/02/2015  . Elevated troponin 01/02/2015  . AKI (acute kidney injury) ([place]) 12/31/2014  . Dysphagia 12/24/2014  . Seizure ([place]) 12/23/2014  . Acute renal failure ([place]) 12/23/2014  . Anemia 12/23/2014  . Hypernatremia   . Swelling of both ankles   . Lower GI bleeding 10/22/2014  . Acute renal failure superimposed on stage 4 chronic kidney disease ([place]) 07/22/2014  . Cardiomyopathy, ischemic 07/20/2014  . Choking episode 04/28/2014  . Hypoglycemia 03/16/2014  . CAP (community acquired pneumonia) 03/15/2014  . Severe mitral regurgitation 02/22/2014  . Acute on chronic combined systolic and diastolic congestive heart failure ([place]) 02/22/2014  . Dementia 02/21/2014  . Diarrhea 02/20/2014  . Edema of right lower extremity 02/20/2014  . GI bleeding 01/16/2014  . Weakness generalized 01/16/2014  . Anemia associated with acute blood loss 01/16/2014  . NSTEMI (non-ST elevated myocardial infarction) ([place]) 01/16/2014  . Systolic CHF, chronic last EF 40% 3/13 01/16/2014  . Iron deficiency anemia 01/16/2014  . Bilateral lower extremity edema 07/22/2013  . Hyperglycemia 12/21/2012  . CKD (chronic kidney disease) stage 4, GFR 15-29 ml/min (HCC) 12/21/2012  . Diabetes ([place]) 12/21/2012  . CVA (cerebral infarction) 04/19/2012  . Neutropenia 01/30/2012  . Seizure disorder ([place]) 01/25/2012  . ARF (acute renal failure) ([place]) 01/25/2012  . Acute CVA Left frontal lobe,   . Hypertension   .  Diabetes mellitus ([place])      Palliative Care Assessment & Plan    Code Status:  DNR  Goals of Care:  FULL COMFORT MEASURES  Symptom Management:  Morphine 1 mg IV Q 2 hours PRN  Morphine 2.5 mg po/sl/pr Q 2 hours PRN (first choice)   Atropine gtts  Psycho-social/Spiritual:  Desire for further Chaplaincy support:Ongoing   Prognosis: Hours - Days Discharge Planning: Likely hospital death.    Care plan was discussed with nursing staff, CM, SW, and Dr. Sarajane Jews.   Thank you for allowing the  Palliative Medicine Team to assist in the care of this patient.   Time In: 1500 Time Out: 1630 Total Time 90 minutes Prolonged Time Billed  yes     Greater than 50%  of this time was spent counseling and coordinating care related to the above assessment and plan.   Drue Novel, NP  09/25/2015, 3:58 PM  Please contact Palliative Medicine Team phone at [phone] for questions and concerns.

## 2015-09-25 DEATH — deceased

## 2015-09-26 DIAGNOSIS — E1122 Type 2 diabetes mellitus with diabetic chronic kidney disease: Secondary | ICD-10-CM

## 2015-09-26 DIAGNOSIS — A4151 Sepsis due to Escherichia coli [E. coli]: Secondary | ICD-10-CM

## 2015-09-26 LAB — CULTURE, BLOOD (ROUTINE X 2)

## 2015-09-26 MED ORDER — MORPHINE SULFATE 10 MG/5ML PO SOLN: 2.5000 mg | ORAL | Status: DC

## 2015-09-26 MED ORDER — MORPHINE SULFATE (CONCENTRATE) 10 MG/0.5ML PO SOLN
2.5000 mg | ORAL | Status: DC
  Administered 2015-09-26 (×3): 2.6 mg via ORAL
  Filled 2015-09-26 (×4): qty 0.5

## 2015-09-26 NOTE — Progress Notes (Signed)
Transferred from ICU to Room 333, comfort care, family present.  No distress noted, patient appears comfortable.  Foley catheter intact, IV's X 2 saline locked, prevalon boots bilateral.

## 2015-09-26 NOTE — Progress Notes (Signed)
[facility] PROGRESS NOTE  [name] VPX:[id number] DOB: 03/08/1941 DOA: 09/20/2015 PCP: [name], MD  Assessment/Plan: ARF on CKD Stage IV Demand Ischemia Hyperkalemia Acute Encephalopathy Sepsis 2/2 E Coli UTI Dementia with Dysphagia Seizure Disorder  Patient has been made comfort care. She is currently unresponsive. Does not appear to be in distress. May need to consider residential hospice. Discussed with daughter [name] and sister Seymour Bars at bedside.  Code Status: DNR Family Communication: Daughter and sister  Disposition Plan: Anticipate hospital death   Consultants:  Renal   Antibiotics:  None   Subjective: Unresponsive  Objective: Filed Vitals:   09/26/15 0600 09/26/15 0700 09/26/15 0800 09/26/15 0900  BP: 105/52 121/49 117/47 117/47  Pulse: 84 85 86 85  Temp:      TempSrc:      Resp: 19 23 20 19   Height:      Weight:      SpO2: 99% 99% 100% 99%    Intake/Output Summary (Last 24 hours) at 09/26/15 1014 Last data filed at 09/26/15 0553  Gross per 24 hour  Intake    625 ml  Output    700 ml  Net    -75 ml   Filed Weights   09/15/2015 4627 09/21/2015 1139 09/25/15 0500  Weight: 63.504 kg (140 lb) 63.3 kg (139 lb 8.8 oz) 70.9 kg (156 lb 4.9 oz)    Exam:   General:  Unresponsive  Cardiovascular: deferred  Respiratory: deferred  Abdomen: deferred  Extremities: deferred   Neurologic:  deferred  Data Reviewed: Basic Metabolic Panel:  Recent Labs Lab 09/02/2015 0733 08/29/2015 1859 09/24/15 0734 09/25/15 0446  NA 171* 169* 163* 158*  K 8.6* 8.0* 7.5* 6.4*  CL 151* 150* 147* >130*  CO2 11* 10* 9* 9*  GLUCOSE 127* 209* 285* 274*  BUN 97* 111* 102* 108*  CREATININE 5.59* 5.43* 5.74* 5.41*  CALCIUM 8.2* 7.8* 7.6* 7.6*  MG  --   --  2.2  --   PHOS  --   --  6.9*  --    Liver Function Tests:  Recent Labs Lab 09/10/2015 0733  AST 35  ALT 13*  ALKPHOS 106  BILITOT 0.9  PROT 7.7  ALBUMIN 2.1*   No results for  input(s): LIPASE, AMYLASE in the last 168 hours. No results for input(s): AMMONIA in the last 168 hours. CBC:  Recent Labs Lab 09/15/2015 0733 09/24/15 0721  WBC 8.9 7.6  NEUTROABS 7.9*  --   HGB 8.5* 9.3*  HCT 29.5* 32.8*  MCV 89.1 91.4  PLT 123* 82*   Cardiac Enzymes:  Recent Labs Lab 09/18/2015 0733 08/29/2015 1328 09/08/2015 1859 09/24/15 0105 09/24/15 0734  TROPONINI 1.33* 2.13* 2.40* 2.39* 2.11*   BNP (last 3 results)  Recent Labs  07/03/15 1514 08/13/15 1328 09/03/2015 0730  BNP 3737.0* >4500.0* 2473.0*    ProBNP (last 3 results) No results for input(s): PROBNP in the last 8760 hours.  CBG:  Recent Labs Lab 09/24/15 1941 09/25/15 0043 09/25/15 0359 09/25/15 0740 09/25/15 1156  GLUCAP 271* 269* 225* 184* 141*    Recent Results (from the past 240 hour(s))  Urine culture     Status: None   Collection Time: 09/13/2015  7:12 AM  Result Value Ref Range Status   Specimen Description URINE, CATHETERIZED  Final   Special Requests NONE  Final   Culture   Final    >=100,000 COLONIES/mL ESCHERICHIA COLI Performed at [facility]    Report Status 09/25/2015  FINAL  Final   Organism ID, Bacteria ESCHERICHIA COLI  Final      Susceptibility   Escherichia coli - MIC*    AMPICILLIN 4 SENSITIVE Sensitive     CEFAZOLIN <=4 SENSITIVE Sensitive     CEFTRIAXONE <=1 SENSITIVE Sensitive     CIPROFLOXACIN <=0.25 SENSITIVE Sensitive     GENTAMICIN <=1 SENSITIVE Sensitive     IMIPENEM <=0.25 SENSITIVE Sensitive     NITROFURANTOIN 32 SENSITIVE Sensitive     TRIMETH/SULFA <=20 SENSITIVE Sensitive     AMPICILLIN/SULBACTAM 4 SENSITIVE Sensitive     PIP/TAZO <=4 SENSITIVE Sensitive     * >=100,000 COLONIES/mL ESCHERICHIA COLI  Culture, blood (routine x 2)     Status: None (Preliminary result)   Collection Time: 08/25/2015  7:33 AM  Result Value Ref Range Status   Specimen Description BLOOD LEFT FOREARM  Final   Special Requests BOTTLES DRAWN AEROBIC ONLY 6CC  Final    Culture  Setup Time   Final    GRAM NEGATIVE RODS RECOVERED FROM THE AEROBIC BOTTLE Gram Stain Report Called to,Read Back By and Verified With: NEILSON,T. AT 0305 ON 09/24/2015 BY Iran Planas. Performed at [place] Performed at [facility]    Report Status PENDING  Incomplete  Culture, blood (routine x 2)     Status: None (Preliminary result)   Collection Time: 09/01/2015  7:46 AM  Result Value Ref Range Status   Specimen Description BLOOD LEFT HAND  Final   Special Requests BOTTLES DRAWN AEROBIC ONLY 6CC  Final   Culture  Setup Time   Final    GRAM NEGATIVE RODS RECOVERED FROM THE AEROBIC BOTTLE. Gram Stain Report Called to,Read Back By and Verified With: NEILSON,T. AT 0305 ON 09/24/2015 BY Iran Planas. Performed at [place] Performed at [facility]    Report Status PENDING  Incomplete  MRSA PCR Screening     Status: None   Collection Time: 09/02/2015 12:49 PM  Result Value Ref Range Status   MRSA by PCR NEGATIVE NEGATIVE Final    Comment:        The GeneXpert MRSA Assay (FDA approved for NASAL specimens only), is one component of a comprehensive MRSA colonization surveillance program. It is not intended to diagnose MRSA infection nor to guide or monitor treatment for MRSA infections.      Studies: No results found.  Scheduled Meds: . antiseptic oral rinse  7 mL Mouth Rinse q12n4p  . chlorhexidine  15 mL Mouth Rinse BID  . sodium chloride  3 mL Intravenous Q12H  . sodium hypochlorite   Irrigation BID   Continuous Infusions:   Principal Problem:   Sepsis ([place]) Active Problems:   CKD (chronic kidney disease) stage 4, GFR 15-29 ml/min (HCC)   Iron deficiency anemia   Dementia   AKI (acute kidney injury) (HCC)   Elevated troponin   UTI (urinary tract infection)   Anemia in chronic kidney disease (CKD)   Uremia   CKD stage 4 due to type 2 diabetes  mellitus ([place])   DNR (do not resuscitate) discussion   Pressure ulcer   Acute encephalopathy   Sacral decubitus ulcer    Time spent: 15 minutes. Greater than 50% of this time was spent in direct contact with the patient coordinating care.    [facility] Pager [phone]  If  7PM-7AM, please contact night-coverage at [email or web address], password [facility] 09/26/2015, 10:14 AM  LOS: 3 days

## 2015-09-26 NOTE — Progress Notes (Signed)
Daily Progress Note   Patient Name: [name]       Date: 09/26/2015 DOB: 07-Jun-1941  Age: 74 y.o. MRN#: [id number] Attending Physician: Mikki Harbor* Primary Care Physician: [name], MD Admit Date: 09/16/2015  Reason for Consultation/Follow-up: Psychosocial/spiritual support and Terminal care  Subjective: [name] is lying in bed with her eyes partially open.  She does not respond to me in any meaningful way.  Her daughter [name] is at bedside.  We talk about her night and [name] states she feels that [name] was comfortable.  [name] asks about food for her mother and I share that she may have small sips if she wants, but that at this point, she will no longer have a desire to eat.  I share that [name] infection continues and that she will become weaker.  We continue to focus on comfort.    Length of Stay: 3 days  Current Medications: Scheduled Meds:  . antiseptic oral rinse  7 mL Mouth Rinse q12n4p  . chlorhexidine  15 mL Mouth Rinse BID  . sodium chloride  3 mL Intravenous Q12H    Continuous Infusions:    PRN Meds: [DISCONTINUED] acetaminophen **OR** acetaminophen, atropine, LORazepam **OR** LORazepam **OR** LORazepam, morphine injection, morphine CONCENTRATE **OR** morphine CONCENTRATE, polyvinyl alcohol  Palliative Performance Scale: 10%     Vital Signs: BP 110/48 mmHg  Pulse 85  Temp(Src) 96.6 F (35.9 C) (Axillary)  Resp 20  Ht 5' (1.524 m)  Wt 70.9 kg (156 lb 4.9 oz)  BMI 30.53 kg/m2  SpO2 97% SpO2: SpO2: 97 % O2 Device: O2 Device: Nasal Cannula O2 Flow Rate: O2 Flow Rate (L/min): 3 L/min  Intake/output summary:  Intake/Output Summary (Last 24 hours) at 09/26/15 1254 Last data filed at 09/26/15 0553  Gross per 24 hour  Intake    375 ml  Output    700 ml  Net   -325 ml   LBM:   Baseline Weight: Weight: 63.504 kg (140 lb) Most recent weight: Weight: 70.9 kg (156 lb 4.9 oz)  Physical Exam: Constitutional: frail,  weak, no eye contact.  Resp:  Even, somewhat labored.  Psyche: un responsive.               Additional Data Reviewed: Recent Labs     09/24/15  0721  09/24/15  0734  09/25/15  0446  WBC  7.6   --    --   HGB  9.3*   --    --   PLT  82*   --    --   NA   --   163*  158*  BUN   --   102*  108*  CREATININE   --   5.74*  5.41*     Problem List:  Patient Active Problem List   Diagnosis Date Noted  . Sacral decubitus ulcer   . Pressure ulcer 09/01/2015  . Sepsis ([place]) 09/04/2015  . Acute encephalopathy 09/08/2015  . Dysphagia, pharyngoesophageal phase 08/19/2015  . Palliative care encounter   . DNR (do not resuscitate) discussion   . Aspiration pneumonia ([place]) 08/13/2015  . SOB (shortness of breath)   . Uremia 05/28/2015  . CKD stage 4 due to type 2 diabetes mellitus ([place]) 05/28/2015  . Normocytic anemia 05/28/2015  . Protein-calorie malnutrition, severe ([place]) 04/28/2015  . Heme positive stool   . History of CVA (cerebrovascular accident) 04/25/2015  . Generalized weakness 04/25/2015  . Protein calorie malnutrition ([place])  04/25/2015  . Type I diabetes mellitus, well controlled ([place]) 04/25/2015  . Essential hypertension 04/25/2015  . Chronic combined systolic and diastolic CHF (congestive heart failure) ([place]) 04/25/2015  . GI bleed 04/25/2015  . Anemia in chronic kidney disease (CKD) 04/25/2015  . Acute on chronic renal failure ([place])   . Blood loss anemia   . UTI (urinary tract infection) 01/04/2015  . Anemia of chronic renal failure, stage 4 (severe) ([place]) 01/02/2015  . Acute on chronic respiratory failure with hypoxia ([place]) 01/02/2015  . Elevated troponin 01/02/2015  . AKI (acute kidney injury) ([place]) 12/31/2014  . Dysphagia 12/24/2014  . Seizure ([place]) 12/23/2014  . Acute renal failure ([place]) 12/23/2014  . Anemia 12/23/2014  . Hypernatremia   . Swelling of both ankles   . Lower GI bleeding 10/22/2014  . Acute renal failure superimposed on stage 4 chronic kidney  disease ([place]) 07/22/2014  . Cardiomyopathy, ischemic 07/20/2014  . Choking episode 04/28/2014  . Hypoglycemia 03/16/2014  . CAP (community acquired pneumonia) 03/15/2014  . Severe mitral regurgitation 02/22/2014  . Acute on chronic combined systolic and diastolic congestive heart failure ([place]) 02/22/2014  . Dementia 02/21/2014  . Diarrhea 02/20/2014  . Edema of right lower extremity 02/20/2014  . GI bleeding 01/16/2014  . Weakness generalized 01/16/2014  . Anemia associated with acute blood loss 01/16/2014  . NSTEMI (non-ST elevated myocardial infarction) ([place]) 01/16/2014  . Systolic CHF, chronic last EF 40% 3/13 01/16/2014  . Iron deficiency anemia 01/16/2014  . Bilateral lower extremity edema 07/22/2013  . Hyperglycemia 12/21/2012  . CKD (chronic kidney disease) stage 4, GFR 15-29 ml/min (HCC) 12/21/2012  . Diabetes ([place]) 12/21/2012  . CVA (cerebral infarction) 04/19/2012  . Neutropenia 01/30/2012  . Seizure disorder ([place]) 01/25/2012  . ARF (acute renal failure) ([place]) 01/25/2012  . Acute CVA Left frontal lobe,   . Hypertension   . Diabetes mellitus ([place])      Palliative Care Assessment & Plan    Code Status:  DNR  Goals of Care:  FULL COMFORT CARE  Symptom Management:  Morphine 2.5 5 mg SL Q 4 hours scheduled, and Morphine 5 mg Q 2 hours PRN.   Ativan 1 mg PO/SL/IV Q 4 hours PRN  Atropine 1% Q 4 hours PRN oral secretions   Psycho-social/Spiritual:  Desire for further Chaplaincy support:Ongoing    Prognosis: Hours - Days Discharge Planning: Likely hospital death, will discuss transfer to Hospice home.    Care plan was discussed with nursing staff, CM, SW, and Dr. Jerilee Hoh.   Thank you for allowing the Palliative Medicine Team to assist in the care of this patient.   Time In:  1015 Time Out: 1100 Total Time 45 minutes Prolonged Time Billed  no     Greater than 50%  of this time was spent counseling and coordinating care related to the above  assessment and plan.   Drue Novel, NP  09/26/2015, 12:54 PM  Please contact Palliative Medicine Team phone at [phone] for questions and concerns.

## 2015-09-26 NOTE — Progress Notes (Signed)
Present with patient's daughter [name] for emotional and spiritual support. [name] shared about caring for her mother in her home for at least 16 years. She shared how she was still working through the changes in her mother's health since September due to heart changes, decline in her ability to eat and now being unresponsive. She shared she had hoped when her mother came to the hospital she would have until Thursday to see if she got any better. We then talked about how she was feeling about holding both the thoughts her mother was in the process of dying and her hopes that she might recover. I'd hoped she'd be able to verbalize how she was working through such ideas and feel like she did to some extent. We talked about the Hospice facility. We talked more about the woman she knew her mother to be and her faith. We prayed with her mother.

## 2015-09-27 ENCOUNTER — Encounter: Payer: Self-pay | Admitting: *Deleted

## 2015-09-27 ENCOUNTER — Other Ambulatory Visit: Payer: Self-pay | Admitting: *Deleted

## 2015-10-02 NOTE — Patient Outreach (Signed)
[facility]) Care Management  10/02/2015  [name] 05-30-1941 [id number]   Notification from Burgess Amor, RN to close case as patient is deceased.  Thanks, Ronnell Freshwater. [place], [place] Assistant Phone: [phone] Fax: [phone]

## 2015-10-24 ENCOUNTER — Ambulatory Visit: Payer: Medicare Other | Admitting: Cardiology

## 2015-10-25 NOTE — Progress Notes (Signed)
Walked into patient's room and she had expired. Myself and Peterson Lombard verified and declared time of death 9. Dr.Harvette jenkins was notified as well as Mrs.Gastineau's daughter, Obera Stauch. Spoke with Leonor Liv from [place] donor and declared she was not a candidate due to her age.

## 2015-10-25 NOTE — Progress Notes (Signed)
Body has been picked up by the funeral home.

## 2015-10-25 NOTE — Patient Outreach (Signed)
Notified of patient death October 13, 2015 Plan to close case per protocol. [name]. [name], RN, BSN, [place] [phone]

## 2015-10-25 NOTE — Progress Notes (Signed)
Family has left.

## 2015-10-25 NOTE — Progress Notes (Signed)
Family at bedside. 

## 2015-10-25 NOTE — Discharge Summary (Signed)
Death summary  Patient was a 74 year old woman admitted on 09/09/2015 due to unresponsiveness. In the emergency department she was found to have multiple acute issues including acute renal failure, fever, sepsis, UTI, severe hyperkalemia as well as an elevated troponin. Patient was unable to offer any history. Patient's daughter stated that since last hospitalization she had done quite poorly and was essentially bedbound. Oral intake had been quite marginal. She had a history of chronic kidney disease stage V and family members had been reluctant to initiate dialysis. She also had dysphagia and recurrent aspiration. Patient did not improve over the initial 48 hours despite aggressive management. We proceeded to have conversations with daughter and sister at bedside in conjunction with the help of the palliative medicine team. She was subsequently made comfort care. Because we anticipated a hospital discharge, it was decided not to pursue residential hospice. She subsequently expired on 10-01-2015 at 12:53 AM.  Domingo Mend, MD [facility] Pager: [phone]

## 2015-10-25 DEATH — deceased

## 2016-04-17 IMAGING — CR DG CHEST 1V PORT
1 series · 1 of 1 positions shown · non-contrast
Comparison: 07/06/2015

CLINICAL DATA: Shortness of breath

EXAM:
PORTABLE CHEST - 1 VIEW

[ap portable]
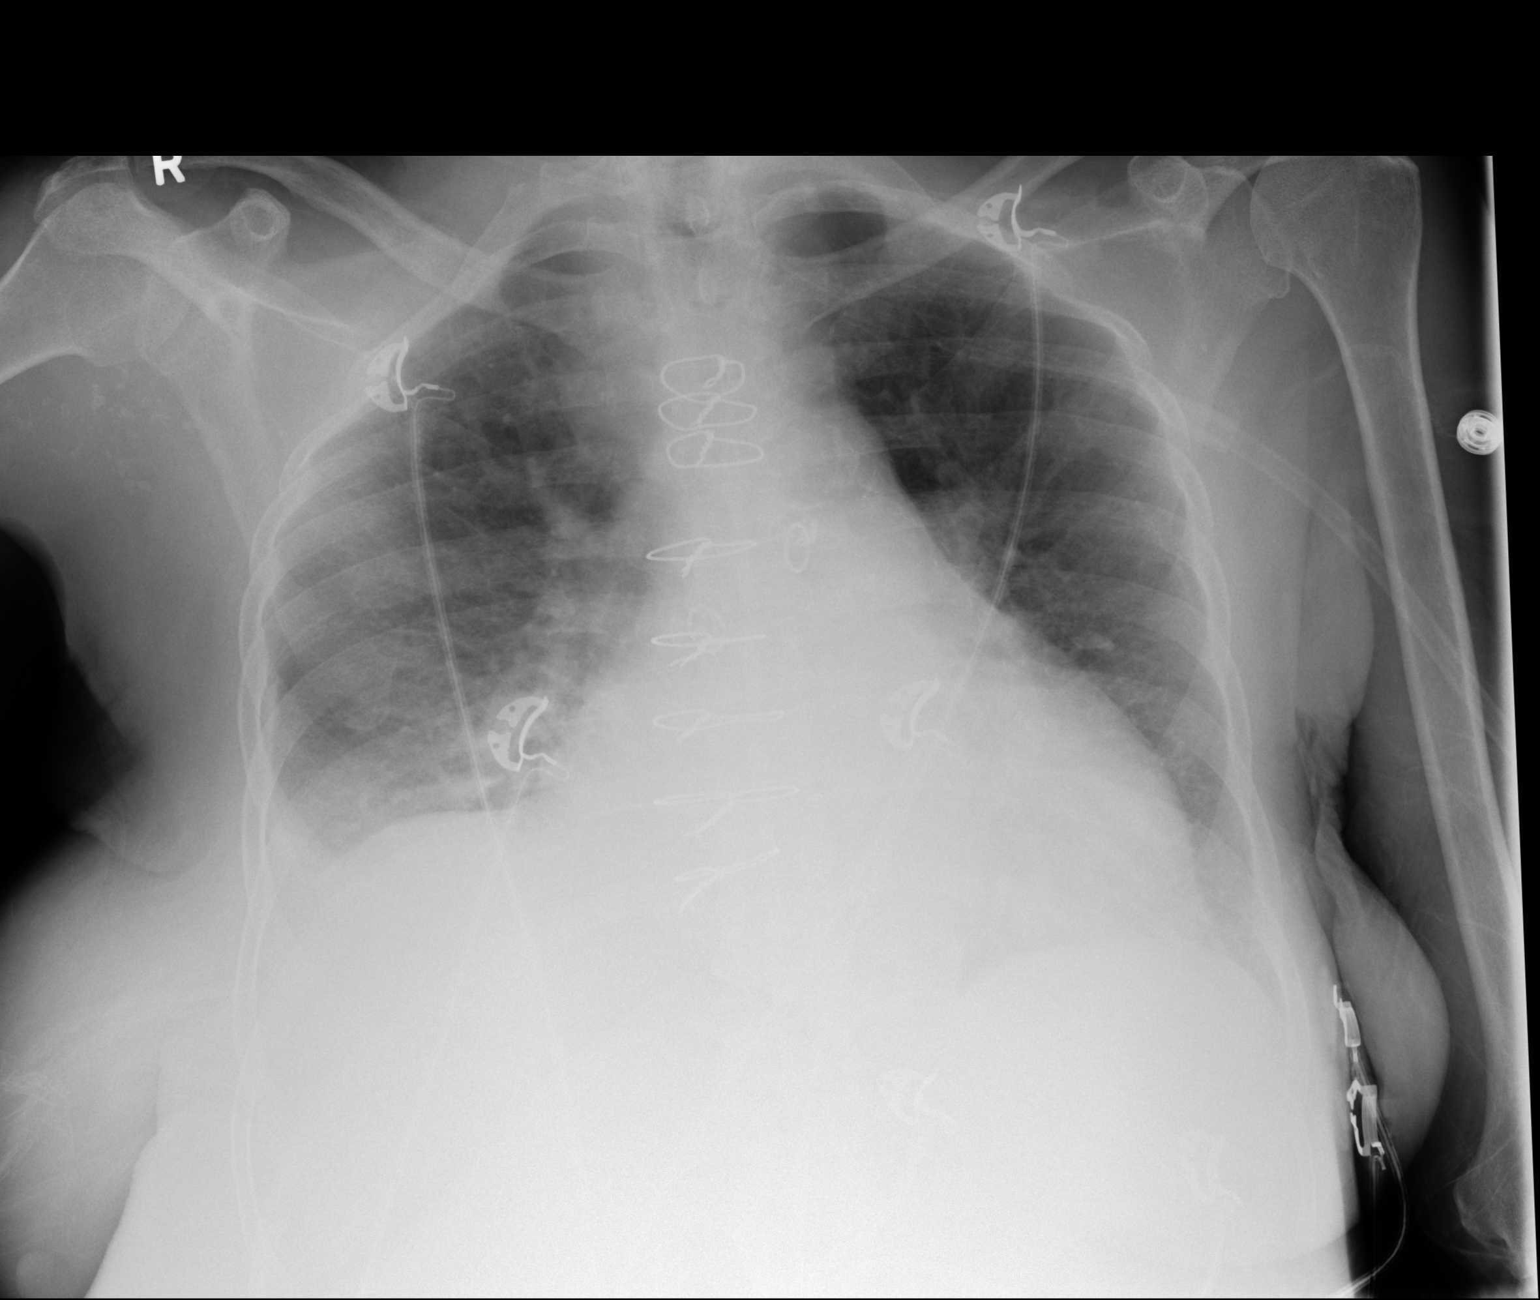

[1 of 1 positions shown; findings below may reference images not displayed]

FINDINGS: Moderate to severe cardiac enlargement. Patient is status post prior
CABG. Mild vascular congestion. Left lung appears clear. Right
diaphragm is elevated similar to prior study. There is mild hazy
density in the right lower lobe as well as blunting of the right
costophrenic angle.
IMPRESSION: Right lower lobe infiltrate with small right pleural effusion.
Pneumonia/pneumonitis suspected.

## 2016-05-14 ENCOUNTER — Other Ambulatory Visit: Payer: Self-pay | Admitting: Nurse Practitioner

## 2017-10-27 IMAGING — US US RENAL
1 series · 14 of 25 positions shown · non-contrast
Comparison: None.

CLINICAL DATA: Acute kidney injury.

EXAM:
RENAL / URINARY TRACT ULTRASOUND COMPLETE

[Series 1: us renal · 0.25mm/px · 14 of 61 slices shown]
[im 1/61]
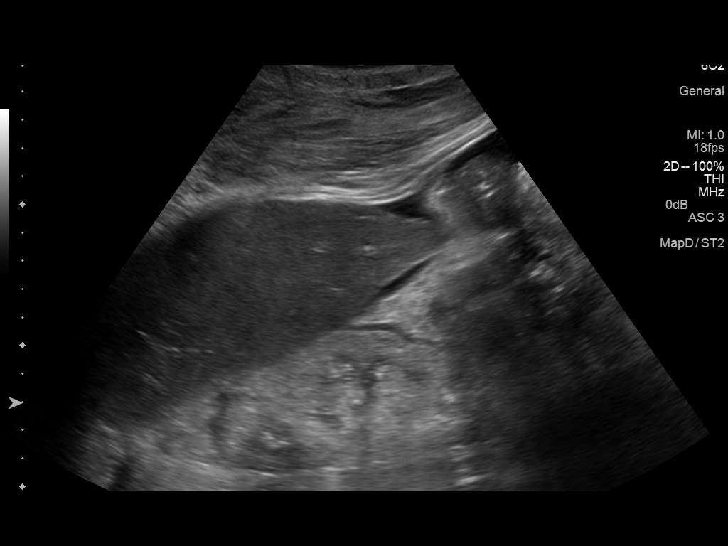
[im 6/61]
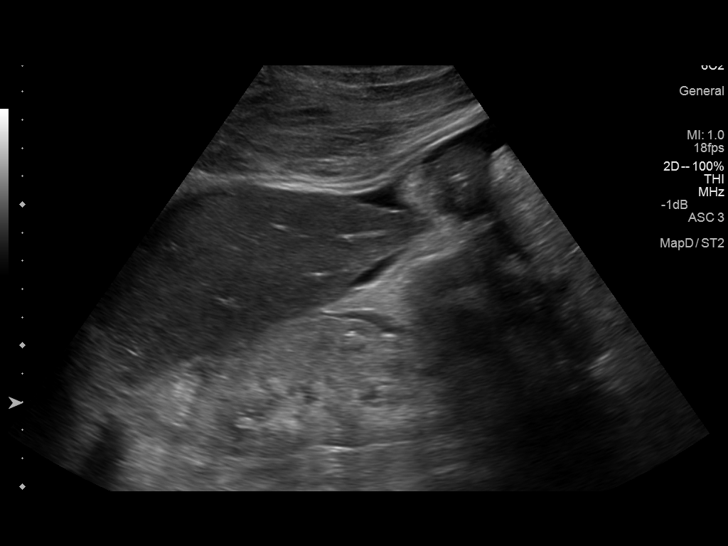
[im 11/61]
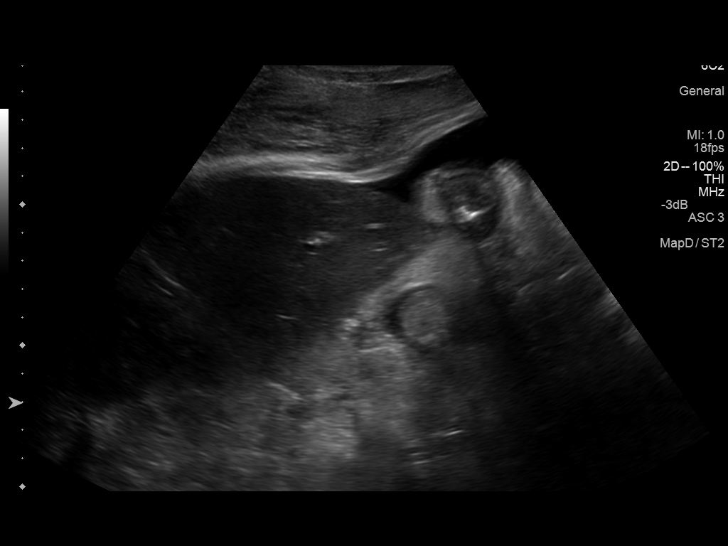
[im 16/61]
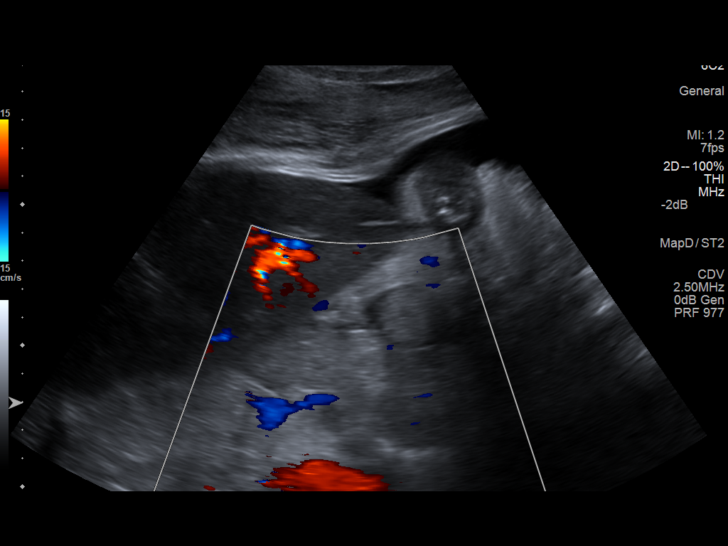
[im 21/61]
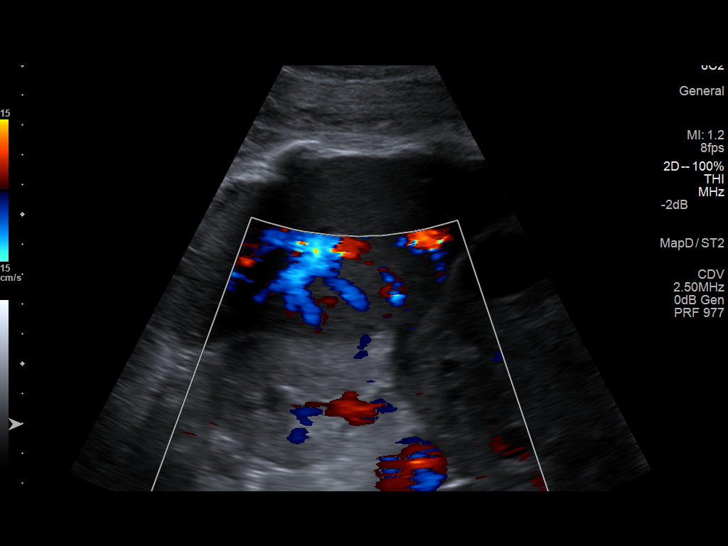
[im 23/61]
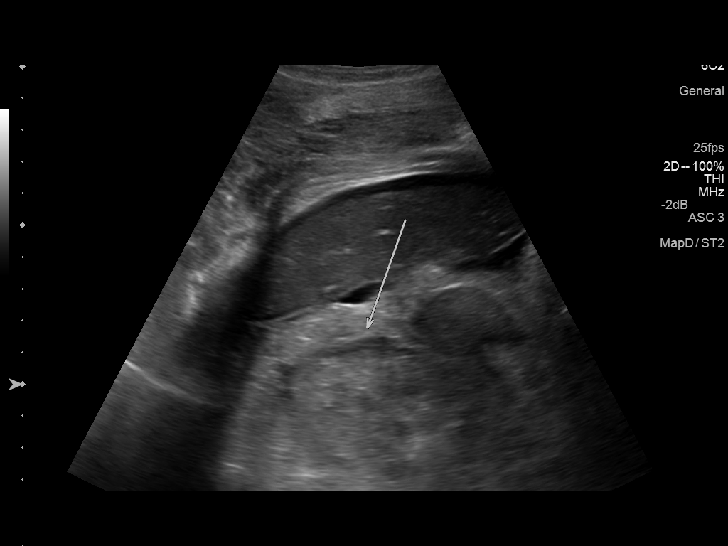
[im 28/61]
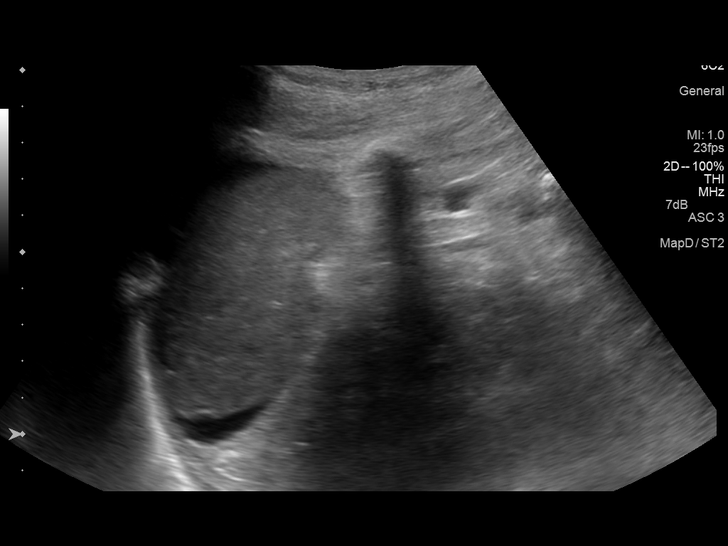
[im 33/61]
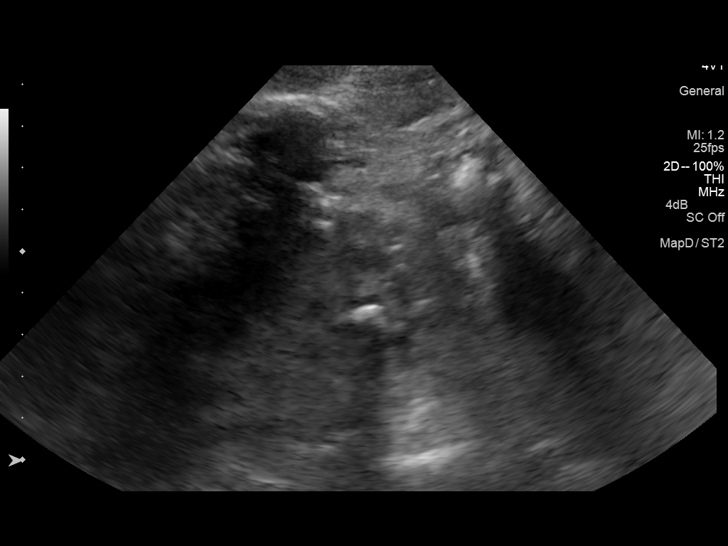
[im 38/61]
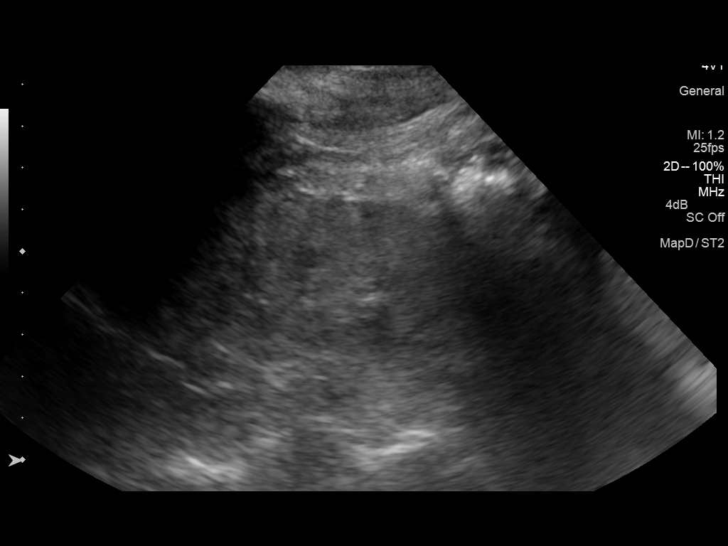
[im 41/61]
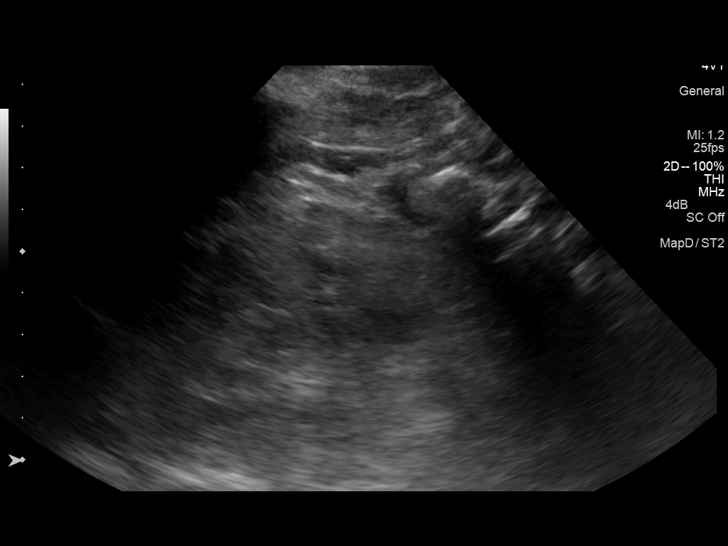
[im 46/61]
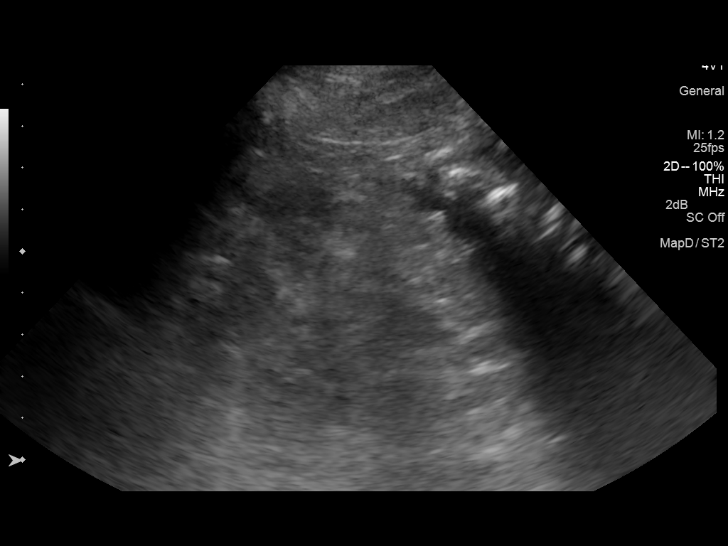
[im 51/61]
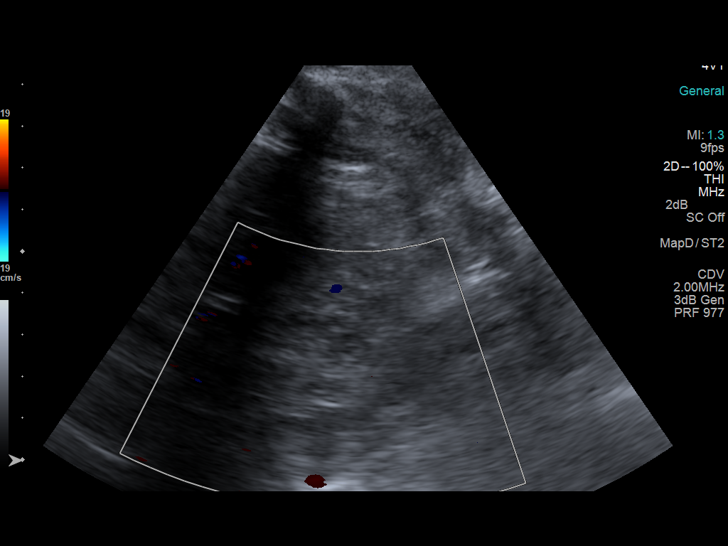
[im 56/61]
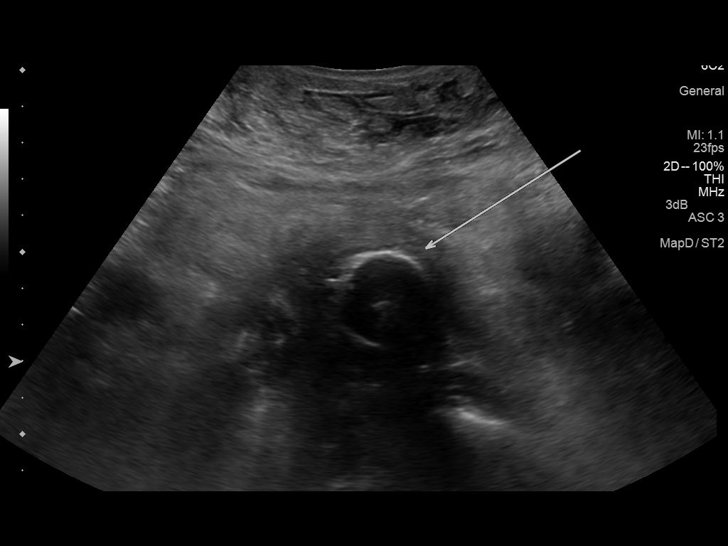
[im 61/61]
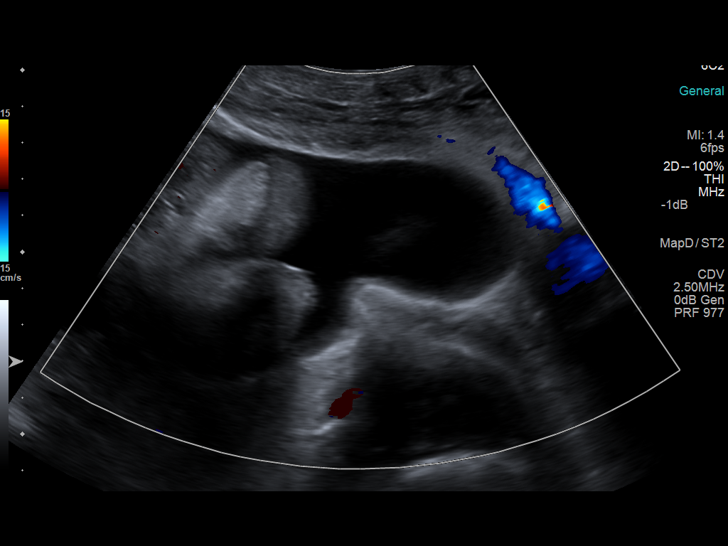

[14 of 25 positions shown; findings below may reference images not displayed]

FINDINGS: Right Kidney:

Length: 11 cm. Diffusely echogenic. Small amount of perinephric
fluid. No mass or hydronephrosis visualized.

Left Kidney:

Length: 10.1 cm. Diffusely echogenic. Stone within the inferior pole
measures 1 cm. No mass or hydronephrosis visualized.

Bladder:

Appears normal for degree of bladder distention.

Other: Small amount of ascites noted within the right upper quadrant
and left lower quadrant.
IMPRESSION: 1. Bilateral echogenic kidneys compatible with chronic medical renal
disease.
2. No obstructive uropathy.
